# Patient Record
Sex: Female | Born: 1950 | Race: White | Hispanic: No | Marital: Married | Smoking: Never smoker
Health system: Southern US, Community
[De-identification: ages and names within clinical notes are randomized; demographics above are authoritative.]

## PROBLEM LIST (undated history)

## (undated) DIAGNOSIS — Z9884 Bariatric surgery status: Secondary | ICD-10-CM

## (undated) DIAGNOSIS — N189 Chronic kidney disease, unspecified: Secondary | ICD-10-CM

## (undated) DIAGNOSIS — F329 Major depressive disorder, single episode, unspecified: Secondary | ICD-10-CM

## (undated) DIAGNOSIS — F419 Anxiety disorder, unspecified: Secondary | ICD-10-CM

## (undated) DIAGNOSIS — G243 Spasmodic torticollis: Secondary | ICD-10-CM

## (undated) DIAGNOSIS — E785 Hyperlipidemia, unspecified: Secondary | ICD-10-CM

## (undated) DIAGNOSIS — I499 Cardiac arrhythmia, unspecified: Secondary | ICD-10-CM

## (undated) DIAGNOSIS — L4050 Arthropathic psoriasis, unspecified: Secondary | ICD-10-CM

## (undated) DIAGNOSIS — M797 Fibromyalgia: Secondary | ICD-10-CM

## (undated) DIAGNOSIS — I10 Essential (primary) hypertension: Secondary | ICD-10-CM

## (undated) DIAGNOSIS — I2510 Atherosclerotic heart disease of native coronary artery without angina pectoris: Secondary | ICD-10-CM

## (undated) DIAGNOSIS — E1140 Type 2 diabetes mellitus with diabetic neuropathy, unspecified: Secondary | ICD-10-CM

## (undated) DIAGNOSIS — F32A Depression, unspecified: Secondary | ICD-10-CM

## (undated) DIAGNOSIS — G4733 Obstructive sleep apnea (adult) (pediatric): Secondary | ICD-10-CM

## (undated) DIAGNOSIS — S46009A Unspecified injury of muscle(s) and tendon(s) of the rotator cuff of unspecified shoulder, initial encounter: Secondary | ICD-10-CM

## (undated) DIAGNOSIS — R600 Localized edema: Secondary | ICD-10-CM

## (undated) DIAGNOSIS — R002 Palpitations: Secondary | ICD-10-CM

## (undated) DIAGNOSIS — K909 Intestinal malabsorption, unspecified: Secondary | ICD-10-CM

## (undated) DIAGNOSIS — M1990 Unspecified osteoarthritis, unspecified site: Secondary | ICD-10-CM

## (undated) DIAGNOSIS — J45909 Unspecified asthma, uncomplicated: Secondary | ICD-10-CM

## (undated) DIAGNOSIS — H269 Unspecified cataract: Secondary | ICD-10-CM

## (undated) DIAGNOSIS — D649 Anemia, unspecified: Secondary | ICD-10-CM

## (undated) HISTORY — DX: Spasmodic torticollis: G24.3

## (undated) HISTORY — DX: Hyperlipidemia, unspecified: E78.5

## (undated) HISTORY — DX: Bariatric surgery status: Z98.84

## (undated) HISTORY — PX: PTCA: SHX146

## (undated) HISTORY — DX: Unspecified asthma, uncomplicated: J45.909

## (undated) HISTORY — DX: Depression, unspecified: F32.A

## (undated) HISTORY — DX: Unspecified cataract: H26.9

## (undated) HISTORY — DX: Atherosclerotic heart disease of native coronary artery without angina pectoris: I25.10

## (undated) HISTORY — DX: Major depressive disorder, single episode, unspecified: F32.9

## (undated) HISTORY — PX: WRIST SURGERY: SHX841

## (undated) HISTORY — PX: LAPAROSCOPIC GASTRIC BANDING: SHX1100

## (undated) HISTORY — DX: Palpitations: R00.2

## (undated) HISTORY — DX: Intestinal malabsorption, unspecified: K90.9

## (undated) HISTORY — DX: Unspecified osteoarthritis, unspecified site: M19.90

## (undated) HISTORY — DX: Type 2 diabetes mellitus with diabetic neuropathy, unspecified: E11.40

## (undated) HISTORY — DX: Anxiety disorder, unspecified: F41.9

## (undated) HISTORY — DX: Unspecified injury of muscle(s) and tendon(s) of the rotator cuff of unspecified shoulder, initial encounter: S46.009A

## (undated) HISTORY — PX: ABDOMINAL HYSTERECTOMY: SHX81

## (undated) HISTORY — PX: CHOLECYSTECTOMY: SHX55

## (undated) HISTORY — DX: Arthropathic psoriasis, unspecified: L40.50

## (undated) HISTORY — DX: Fibromyalgia: M79.7

## (undated) HISTORY — DX: Localized edema: R60.0

## (undated) HISTORY — DX: Obstructive sleep apnea (adult) (pediatric): G47.33

## (undated) HISTORY — DX: Essential (primary) hypertension: I10

## (undated) HISTORY — DX: Anemia, unspecified: D64.9

---

## 1999-12-15 ENCOUNTER — Encounter (INDEPENDENT_AMBULATORY_CARE_PROVIDER_SITE_OTHER): Payer: Self-pay

## 1999-12-15 ENCOUNTER — Ambulatory Visit (HOSPITAL_COMMUNITY): Admission: RE | Admit: 1999-12-15 | Discharge: 1999-12-15 | Payer: Self-pay | Admitting: Obstetrics and Gynecology

## 2000-08-30 ENCOUNTER — Inpatient Hospital Stay (HOSPITAL_COMMUNITY): Admission: RE | Admit: 2000-08-30 | Discharge: 2000-09-01 | Payer: Self-pay | Admitting: Obstetrics and Gynecology

## 2000-08-30 ENCOUNTER — Encounter (INDEPENDENT_AMBULATORY_CARE_PROVIDER_SITE_OTHER): Payer: Self-pay | Admitting: Specialist

## 2002-02-25 ENCOUNTER — Encounter: Admission: RE | Admit: 2002-02-25 | Discharge: 2002-02-25 | Payer: Self-pay

## 2002-06-02 ENCOUNTER — Encounter: Admission: RE | Admit: 2002-06-02 | Discharge: 2002-06-02 | Payer: Self-pay

## 2002-07-21 ENCOUNTER — Encounter: Payer: Self-pay | Admitting: Rheumatology

## 2002-07-21 ENCOUNTER — Encounter: Admission: RE | Admit: 2002-07-21 | Discharge: 2002-07-21 | Payer: Self-pay | Admitting: Rheumatology

## 2002-10-02 ENCOUNTER — Ambulatory Visit (HOSPITAL_COMMUNITY): Admission: RE | Admit: 2002-10-02 | Discharge: 2002-10-02 | Payer: Self-pay | Admitting: Cardiology

## 2002-10-02 ENCOUNTER — Encounter: Payer: Self-pay | Admitting: Cardiology

## 2002-10-02 HISTORY — PX: CARDIAC CATHETERIZATION: SHX172

## 2003-05-25 ENCOUNTER — Encounter: Admission: RE | Admit: 2003-05-25 | Discharge: 2003-05-25 | Payer: Self-pay | Admitting: Family Medicine

## 2005-08-03 ENCOUNTER — Encounter: Admission: RE | Admit: 2005-08-03 | Discharge: 2005-08-03 | Payer: Self-pay | Admitting: Family Medicine

## 2005-10-02 ENCOUNTER — Ambulatory Visit: Payer: Self-pay | Admitting: Cardiology

## 2005-10-04 ENCOUNTER — Ambulatory Visit: Payer: Self-pay | Admitting: Internal Medicine

## 2005-10-04 ENCOUNTER — Inpatient Hospital Stay (HOSPITAL_COMMUNITY): Admission: AD | Admit: 2005-10-04 | Discharge: 2005-10-09 | Payer: Self-pay | Admitting: Internal Medicine

## 2005-10-05 ENCOUNTER — Ambulatory Visit: Payer: Self-pay | Admitting: Infectious Diseases

## 2005-10-05 ENCOUNTER — Encounter: Payer: Self-pay | Admitting: Vascular Surgery

## 2005-10-06 ENCOUNTER — Encounter (INDEPENDENT_AMBULATORY_CARE_PROVIDER_SITE_OTHER): Payer: Self-pay | Admitting: *Deleted

## 2005-10-25 ENCOUNTER — Ambulatory Visit: Payer: Self-pay | Admitting: Internal Medicine

## 2005-10-25 ENCOUNTER — Ambulatory Visit (HOSPITAL_COMMUNITY): Admission: RE | Admit: 2005-10-25 | Discharge: 2005-10-25 | Payer: Self-pay | Admitting: Internal Medicine

## 2005-11-06 ENCOUNTER — Ambulatory Visit: Payer: Self-pay | Admitting: Infectious Diseases

## 2005-11-10 ENCOUNTER — Ambulatory Visit: Payer: Self-pay | Admitting: Internal Medicine

## 2005-11-15 ENCOUNTER — Ambulatory Visit: Payer: Self-pay | Admitting: Internal Medicine

## 2005-11-16 ENCOUNTER — Ambulatory Visit (HOSPITAL_COMMUNITY): Admission: RE | Admit: 2005-11-16 | Discharge: 2005-11-16 | Payer: Self-pay | Admitting: Internal Medicine

## 2005-11-23 ENCOUNTER — Ambulatory Visit: Payer: Self-pay | Admitting: Internal Medicine

## 2005-12-19 ENCOUNTER — Ambulatory Visit: Payer: Self-pay | Admitting: Internal Medicine

## 2006-05-18 ENCOUNTER — Telehealth: Payer: Self-pay | Admitting: *Deleted

## 2006-06-19 ENCOUNTER — Ambulatory Visit: Payer: Self-pay | Admitting: Internal Medicine

## 2006-11-12 DIAGNOSIS — R002 Palpitations: Secondary | ICD-10-CM

## 2006-11-12 DIAGNOSIS — E785 Hyperlipidemia, unspecified: Secondary | ICD-10-CM | POA: Insufficient documentation

## 2006-11-12 DIAGNOSIS — J45909 Unspecified asthma, uncomplicated: Secondary | ICD-10-CM

## 2006-11-12 DIAGNOSIS — I10 Essential (primary) hypertension: Secondary | ICD-10-CM | POA: Insufficient documentation

## 2006-11-12 HISTORY — DX: Unspecified asthma, uncomplicated: J45.909

## 2006-11-12 HISTORY — DX: Palpitations: R00.2

## 2006-12-05 DIAGNOSIS — M1990 Unspecified osteoarthritis, unspecified site: Secondary | ICD-10-CM | POA: Insufficient documentation

## 2006-12-25 ENCOUNTER — Encounter: Payer: Self-pay | Admitting: Internal Medicine

## 2007-01-04 ENCOUNTER — Ambulatory Visit: Payer: Self-pay | Admitting: Internal Medicine

## 2007-01-04 DIAGNOSIS — M797 Fibromyalgia: Secondary | ICD-10-CM

## 2007-01-04 HISTORY — DX: Fibromyalgia: M79.7

## 2007-01-11 ENCOUNTER — Ambulatory Visit: Payer: Self-pay | Admitting: Internal Medicine

## 2007-01-31 ENCOUNTER — Ambulatory Visit (HOSPITAL_COMMUNITY): Admission: RE | Admit: 2007-01-31 | Discharge: 2007-01-31 | Payer: Self-pay | Admitting: Internal Medicine

## 2007-01-31 ENCOUNTER — Ambulatory Visit: Payer: Self-pay | Admitting: Internal Medicine

## 2007-02-01 ENCOUNTER — Ambulatory Visit: Payer: Self-pay

## 2007-02-01 ENCOUNTER — Encounter: Payer: Self-pay | Admitting: Internal Medicine

## 2007-03-17 ENCOUNTER — Emergency Department (HOSPITAL_COMMUNITY): Admission: EM | Admit: 2007-03-17 | Discharge: 2007-03-17 | Payer: Self-pay | Admitting: Family Medicine

## 2007-04-14 ENCOUNTER — Emergency Department (HOSPITAL_COMMUNITY): Admission: EM | Admit: 2007-04-14 | Discharge: 2007-04-14 | Payer: Self-pay | Admitting: Emergency Medicine

## 2007-05-23 ENCOUNTER — Encounter: Payer: Self-pay | Admitting: Internal Medicine

## 2007-06-20 ENCOUNTER — Ambulatory Visit: Payer: Self-pay | Admitting: Internal Medicine

## 2007-08-02 ENCOUNTER — Ambulatory Visit: Admission: RE | Admit: 2007-08-02 | Discharge: 2007-08-02 | Payer: Self-pay | Admitting: Internal Medicine

## 2007-08-02 ENCOUNTER — Encounter: Payer: Self-pay | Admitting: Internal Medicine

## 2007-09-25 ENCOUNTER — Telehealth: Payer: Self-pay | Admitting: Internal Medicine

## 2007-10-25 ENCOUNTER — Encounter: Admission: RE | Admit: 2007-10-25 | Discharge: 2007-10-25 | Payer: Self-pay | Admitting: Internal Medicine

## 2007-11-29 ENCOUNTER — Ambulatory Visit: Admission: RE | Admit: 2007-11-29 | Discharge: 2007-11-29 | Payer: Self-pay | Admitting: Internal Medicine

## 2007-12-09 ENCOUNTER — Telehealth: Payer: Self-pay | Admitting: Internal Medicine

## 2007-12-10 ENCOUNTER — Telehealth: Payer: Self-pay | Admitting: Internal Medicine

## 2007-12-13 ENCOUNTER — Encounter: Payer: Self-pay | Admitting: Internal Medicine

## 2008-02-13 ENCOUNTER — Ambulatory Visit (HOSPITAL_BASED_OUTPATIENT_CLINIC_OR_DEPARTMENT_OTHER): Admission: RE | Admit: 2008-02-13 | Discharge: 2008-02-14 | Payer: Self-pay | Admitting: Orthopedic Surgery

## 2008-03-09 ENCOUNTER — Telehealth: Payer: Self-pay | Admitting: Internal Medicine

## 2008-03-09 ENCOUNTER — Ambulatory Visit: Payer: Self-pay | Admitting: Internal Medicine

## 2008-03-09 LAB — CONVERTED CEMR LAB
ALT: 18 units/L (ref 0–35)
Albumin: 3.5 g/dL (ref 3.5–5.2)
Total Protein: 6.7 g/dL (ref 6.0–8.3)

## 2008-03-25 ENCOUNTER — Encounter: Payer: Self-pay | Admitting: Internal Medicine

## 2008-08-06 DIAGNOSIS — G4733 Obstructive sleep apnea (adult) (pediatric): Secondary | ICD-10-CM

## 2008-08-06 DIAGNOSIS — Z8619 Personal history of other infectious and parasitic diseases: Secondary | ICD-10-CM

## 2008-08-06 DIAGNOSIS — E1142 Type 2 diabetes mellitus with diabetic polyneuropathy: Secondary | ICD-10-CM | POA: Insufficient documentation

## 2008-08-06 DIAGNOSIS — M545 Low back pain: Secondary | ICD-10-CM | POA: Insufficient documentation

## 2008-08-06 HISTORY — DX: Obstructive sleep apnea (adult) (pediatric): G47.33

## 2008-08-12 ENCOUNTER — Ambulatory Visit: Payer: Self-pay | Admitting: Internal Medicine

## 2008-09-08 ENCOUNTER — Encounter: Payer: Self-pay | Admitting: Internal Medicine

## 2008-09-08 ENCOUNTER — Ambulatory Visit (HOSPITAL_COMMUNITY): Admission: RE | Admit: 2008-09-08 | Discharge: 2008-09-08 | Payer: Self-pay | Admitting: General Surgery

## 2008-09-15 ENCOUNTER — Encounter: Admission: RE | Admit: 2008-09-15 | Discharge: 2008-09-15 | Payer: Self-pay | Admitting: General Surgery

## 2008-09-22 ENCOUNTER — Encounter: Payer: Self-pay | Admitting: Internal Medicine

## 2008-10-26 ENCOUNTER — Ambulatory Visit: Payer: Self-pay | Admitting: Internal Medicine

## 2008-12-14 DIAGNOSIS — R600 Localized edema: Secondary | ICD-10-CM

## 2008-12-14 DIAGNOSIS — F419 Anxiety disorder, unspecified: Secondary | ICD-10-CM

## 2008-12-14 DIAGNOSIS — F32A Depression, unspecified: Secondary | ICD-10-CM

## 2008-12-14 DIAGNOSIS — D649 Anemia, unspecified: Secondary | ICD-10-CM

## 2008-12-14 DIAGNOSIS — R609 Edema, unspecified: Secondary | ICD-10-CM

## 2008-12-14 DIAGNOSIS — I10 Essential (primary) hypertension: Secondary | ICD-10-CM

## 2008-12-14 DIAGNOSIS — M1990 Unspecified osteoarthritis, unspecified site: Secondary | ICD-10-CM

## 2008-12-14 DIAGNOSIS — B2790 Infectious mononucleosis, unspecified without complication: Secondary | ICD-10-CM

## 2008-12-14 DIAGNOSIS — E1140 Type 2 diabetes mellitus with diabetic neuropathy, unspecified: Secondary | ICD-10-CM

## 2008-12-14 DIAGNOSIS — I2510 Atherosclerotic heart disease of native coronary artery without angina pectoris: Secondary | ICD-10-CM

## 2008-12-14 DIAGNOSIS — J45909 Unspecified asthma, uncomplicated: Secondary | ICD-10-CM

## 2008-12-14 HISTORY — DX: Atherosclerotic heart disease of native coronary artery without angina pectoris: I25.10

## 2008-12-14 HISTORY — DX: Type 2 diabetes mellitus with diabetic neuropathy, unspecified: E11.40

## 2008-12-14 HISTORY — DX: Localized edema: R60.0

## 2008-12-14 HISTORY — DX: Depression, unspecified: F32.A

## 2008-12-14 HISTORY — DX: Essential (primary) hypertension: I10

## 2008-12-14 HISTORY — DX: Anxiety disorder, unspecified: F41.9

## 2008-12-14 HISTORY — DX: Unspecified osteoarthritis, unspecified site: M19.90

## 2008-12-14 HISTORY — DX: Anemia, unspecified: D64.9

## 2009-03-11 ENCOUNTER — Encounter: Admission: RE | Admit: 2009-03-11 | Discharge: 2009-04-07 | Payer: Self-pay | Admitting: General Surgery

## 2009-03-23 ENCOUNTER — Ambulatory Visit (HOSPITAL_COMMUNITY): Admission: RE | Admit: 2009-03-23 | Discharge: 2009-03-24 | Payer: Self-pay | Admitting: General Surgery

## 2009-06-29 ENCOUNTER — Encounter: Admission: RE | Admit: 2009-06-29 | Discharge: 2009-09-27 | Payer: Self-pay | Admitting: General Surgery

## 2009-07-29 ENCOUNTER — Ambulatory Visit (HOSPITAL_COMMUNITY): Admission: RE | Admit: 2009-07-29 | Discharge: 2009-07-29 | Payer: Self-pay | Admitting: Family Medicine

## 2009-10-05 ENCOUNTER — Encounter: Payer: Self-pay | Admitting: Internal Medicine

## 2009-10-05 ENCOUNTER — Encounter: Admission: RE | Admit: 2009-10-05 | Discharge: 2009-10-05 | Payer: Self-pay | Admitting: General Surgery

## 2010-01-21 ENCOUNTER — Ambulatory Visit: Payer: Self-pay | Admitting: Internal Medicine

## 2010-01-21 DIAGNOSIS — E785 Hyperlipidemia, unspecified: Secondary | ICD-10-CM

## 2010-01-21 HISTORY — DX: Hyperlipidemia, unspecified: E78.5

## 2010-01-21 LAB — CONVERTED CEMR LAB: Hgb A1c MFr Bld: 7.5 % — ABNORMAL HIGH (ref 4.6–6.5)

## 2010-04-21 ENCOUNTER — Telehealth: Payer: Self-pay | Admitting: Internal Medicine

## 2010-04-26 ENCOUNTER — Encounter: Payer: Self-pay | Admitting: Internal Medicine

## 2010-04-29 ENCOUNTER — Ambulatory Visit: Admit: 2010-04-29 | Payer: Self-pay | Admitting: Internal Medicine

## 2010-05-10 NOTE — Assessment & Plan Note (Signed)
Summary: pt to re-est/pt moved back/cjr   Vital Signs:  Patient profile:   60 year old female Height:      65 inches Weight:      191 pounds BMI:     31.90 Temp:     98.2 degrees F oral BP sitting:   108 / 90  (right arm) Cuff size:   regular  Vitals Entered By: Duard Brady LPN (January 21, 2010 1:56 PM) CC: re-establish - doing well   lap band surgery in dec 2010     fbs 90 Is Patient Diabetic? Yes Did you bring your meter with you today? No   CC:  re-establish - doing well   lap band surgery in dec 2010     fbs 90.  History of Present Illness: 60 year old patient who is seen today to reestablish with our practice.  She has a living in [place] and has relocated back to [place] recently.  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  She is approximately 10 months status post lap band surgery and has lost 54 pounds in weight.  She has type 2 diabetes.  She has a history of peripheral neuropathy and early retinopathy.  She has dyslipidemia, but self discontinued Lipitor.  Some time ago.  She has treated hypertension.  She has a history of asthma, which has been stable  Preventive Screening-Counseling & Management  Caffeine-Diet-Exercise     Does Patient Exercise: yes  Allergies (verified): No Known Drug Allergies  Past History:  Past Medical History: Obesity 2007 history of CMV hepatitis INFECTIOUS MONONUCLEOSIS, HX OF (ICD-V12.09) FATIGUE (ICD-780.79) SINUS TACHYCARDIA,HX OF (ICD-427.89) ASTHMA (ICD-493.90) DIZZINESS,HX OF (ICD-780.4) EDEMA, ANKLES (ICD-782.3) HX OF GALLSTONE (ICD-V12.79) ANEMIA, HX OF (ICD-V12.3) UTI'S, HX OF (ICD-V13.00) PALPITATIONS, HX OF (ICD-V12.50) DYSPNEA (ICD-786.05) ANXIETY DISORDER, HX OF (ICD-V11.2) SPECIAL SCREENING FOR MALIGNANT NEOPLASMS COLON (ICD-V76.51) PERIPHERAL NEUROPATHY (ICD-356.9) SLEEP APNEA, OBSTRUCTIVE, MODERATE (ICD-327.23) FIBROMYALGIA (ICD-729.1) EXAMINATION, ROUTINE MEDICAL  (ICD-V70.0) OSTEOARTHRITIS (ICD-715.90) HYPERTENSION (ICD-401.9) HYPERLIPIDEMIA (ICD-272.4) DIABETES MELLITUS, TYPE II (ICD-250.00)   Depression  Past Surgical History: Cholecystectomy 2004 Hysterectomy  2000 left wrist surgery, November 2009 PERCUTANEOUS TRANSLUMINAL CORONARY ANGIOPLASTY, HX OF (ICD-V45.82) 10-02-2002 Lap Band surgery 03-2009 ([facility]) colonoscopy 2010  Family History: Reviewed history from 08/12/2008 and no changes required. father: died at 36, history of diabetes, hypertension 3 row vascular disease mother: died age 61, senile dementia one sister: history of measles encephalitis, with residual impaired cognitive function  Social History: Reviewed history from 08/06/2008 and no changes required. Married Patient has never smoked.  Alcohol Use - yes-occasional wine Daily Caffeine Use- 1 cup daily Illicit Drug Use - no Regular exercise-yes [place]- PT clinicDoes Patient Exercise:  yes  Review of Systems  The patient denies anorexia, fever, weight loss, weight gain, vision loss, decreased hearing, hoarseness, chest pain, syncope, dyspnea on exertion, peripheral edema, prolonged cough, headaches, hemoptysis, abdominal pain, melena, hematochezia, severe indigestion/heartburn, hematuria, incontinence, genital sores, muscle weakness, suspicious skin lesions, transient blindness, difficulty walking, depression, unusual weight change, abnormal bleeding, enlarged lymph nodes, angioedema, and breast masses.    Physical Exam  General:  overweight-appearing.  110/70overweight-appearing.   Head:  Normocephalic and atraumatic without obvious abnormalities. No apparent alopecia or balding. Eyes:  No corneal or conjunctival inflammation noted. EOMI. Perrla. Funduscopic exam benign, without hemorrhages, exudates or papilledema. Vision grossly normal. Mouth:  Oral mucosa and oropharynx without lesions or exudates.  Teeth in good repair. Neck:  No deformities, masses,  or tenderness noted. Lungs:  Normal respiratory effort, chest  expands symmetrically. Lungs are clear to auscultation, no crackles or wheezes. Heart:  Normal rate and regular rhythm. S1 and S2 normal without gallop, murmur, click, rub or other extra sounds. Abdomen:  Bowel sounds positive,abdomen soft and non-tender without masses, organomegaly or hernias noted. Msk:  No deformity or scoliosis noted of thoracic or lumbar spine.   Pulses:  R and L carotid,radial,femoral,dorsalis pedis and posterior tibial pulses are full and equal bilaterally Extremities:  No clubbing, cyanosis, edema, or deformity noted with normal full range of motion of all joints.   Skin:  Intact without suspicious lesions or rashes Cervical Nodes:  No lymphadenopathy noted Axillary Nodes:  No palpable lymphadenopathy Psych:  Cognition and judgment appear intact. Alert and cooperative with normal attention span and concentration. No apparent delusions, illusions, hallucinations  Diabetes Management Exam:    Eye Exam:       Eye Exam done here today          Results: normal   Impression & Recommendations:  Problem # 1:  HYPERTENSION (ICD-401.9)  Her updated medication list for this problem includes:    Lisinopril-hydrochlorothiazide 20-12.5 Mg Tabs (Lisinopril-hydrochlorothiazide) .Marland Kitchen... Take 1 tablet by mouth once a day    Diltiazem Hcl Cr 180 Mg Cp24 (Diltiazem hcl) .Marland Kitchen... Take 1 tablet by mouth once a day  Her updated medication list for this problem includes:    Lisinopril-hydrochlorothiazide 20-12.5 Mg Tabs (Lisinopril-hydrochlorothiazide) .Marland Kitchen... Take 1 tablet by mouth once a day    Diltiazem Hcl Cr 180 Mg Cp24 (Diltiazem hcl) .Marland Kitchen... Take 1 tablet by mouth once a day  Problem # 2:  DIABETES MELLITUS, TYPE II (ICD-250.00)  The following medications were removed from the medication list:    Actoplus Met 15-850 Mg Tabs (Pioglitazone hcl-metformin hcl) .Marland Kitchen... Take 1 tablet by mouth two times a day Her updated medication  list for this problem includes:    Lisinopril-hydrochlorothiazide 20-12.5 Mg Tabs (Lisinopril-hydrochlorothiazide) .Marland Kitchen... Take 1 tablet by mouth once a day    Levemir 100 Unit/ml Soln (Insulin detemir) .Marland KitchenMarland KitchenMarland KitchenMarland Kitchen 80 units subq at bedtime  The following medications were removed from the medication list:    Actoplus Met 15-850 Mg Tabs (Pioglitazone hcl-metformin hcl) .Marland Kitchen... Take 1 tablet by mouth two times a day Her updated medication list for this problem includes:    Lisinopril-hydrochlorothiazide 20-12.5 Mg Tabs (Lisinopril-hydrochlorothiazide) .Marland Kitchen... Take 1 tablet by mouth once a day    Levemir 100 Unit/ml Soln (Insulin detemir) .Marland KitchenMarland KitchenMarland KitchenMarland Kitchen 80 units subq at bedtime  Problem # 3:  HYPERLIPIDEMIA (ICD-272.4)  The following medications were removed from the medication list:    Lipitor 10 Mg Tabs (Atorvastatin calcium) .Marland Kitchen... 1 once daily will check a fasting lipid panel next visit  The following medications were removed from the medication list:    Lipitor 10 Mg Tabs (Atorvastatin calcium) .Marland Kitchen... 1 once daily  Complete Medication List: 1)  Alprazolam 0.25 Mg Tabs (Alprazolam) .... Take 1 tablet by mouth at bedtime 2)  Lisinopril-hydrochlorothiazide 20-12.5 Mg Tabs (Lisinopril-hydrochlorothiazide) .... Take 1 tablet by mouth once a day 3)  Levemir 100 Unit/ml Soln (Insulin detemir) .... 80 units subq at bedtime 4)  Cymbalta 60 Mg Cpep (Duloxetine hcl) .... Take 1 tablet by mouth once a day 5)  Diltiazem Hcl Cr 180 Mg Cp24 (Diltiazem hcl) .... Take 1 tablet by mouth once a day 6)  Multivitamins Caps (Multiple vitamin) .... Once daily 7)  Calcium Carbonate 600 Mg Tabs (Calcium carbonate) .... Once daily 8)  Freestyle Lite Strp (Glucose blood) .... Use  two times a day 9)  Bd Insulin Syringe Microfine 27g X 5/8" 1 Ml Misc (Insulin syringe-needle u-100) .... Use daily 10)  Estrace 1 Mg Tabs (Estradiol) .Marland Kitchen.. 1 once daily 11)  Cyclobenzaprine Hcl 10 Mg Tabs (Cyclobenzaprine hcl) .... One tablet by mouth at  bedtime 12)  Lancets Misc (Lancets) .... Uad 13)  Proair Hfa 108 (90 Base) Mcg/act Aers (Albuterol sulfate) .... 2 puffs q 6hrs prn  Other Orders: Venipuncture (62376) TLB-A1C / Hgb A1C (Glycohemoglobin) (83036-A1C) Specimen Handling (28315)  Patient Instructions: 1)  Please schedule a follow-up appointment in 3 months. 2)  Advised not to eat any food or drink any liquids after 10 PM the night before your procedure. 3)  Limit your Sodium (Salt). 4)  It is important that you exercise regularly at least 20 minutes 5 times a week. If you develop chest pain, have severe difficulty breathing, or feel very tired , stop exercising immediately and seek medical attention. 5)  You need to lose weight. Consider a lower calorie diet and regular exercise.  6)  Check your blood sugars regularly. If your readings are usually above : or below 70 you should contact our office. 7)  It is important that your Diabetic A1c level is checked every 3 months. 8)  See your eye doctor yearly to check for diabetic eye damage. Prescriptions: PROAIR HFA 108 (90 BASE) MCG/ACT AERS (ALBUTEROL SULFATE) 2 puffs q 6hrs prn  #3 x 6   Entered and Authorized by:   Gordy Savers  MD   Signed by:   Gordy Savers  MD on 01/21/2010   Method used:   Print then Give to Patient   RxID:   [id number] LANCETS  MISC (LANCETS) UAD  #200 x 8   Entered and Authorized by:   Gordy Savers  MD   Signed by:   Gordy Savers  MD on 01/21/2010   Method used:   Print then Give to Patient   RxID:   [id number] CYCLOBENZAPRINE HCL 10 MG TABS (CYCLOBENZAPRINE HCL) one tablet by mouth at bedtime  #90 x 5   Entered and Authorized by:   Gordy Savers  MD   Signed by:   Gordy Savers  MD on 01/21/2010   Method used:   Print then Give to Patient   RxID:   [id number] BD INSULIN SYRINGE MICROFINE 27G X 5/8" 1 ML  MISC (INSULIN SYRINGE-NEEDLE U-100) use daily  #30 Each x 0   Entered and Authorized  by:   Gordy Savers  MD   Signed by:   Gordy Savers  MD on 01/21/2010   Method used:   Print then Give to Patient   RxID:   [id number] FREESTYLE LITE   STRP (GLUCOSE BLOOD) use two times a day  #100 Each x 5   Entered and Authorized by:   Gordy Savers  MD   Signed by:   Gordy Savers  MD on 01/21/2010   Method used:   Print then Give to Patient   RxID:   [id number] DILTIAZEM HCL CR 180 MG  CP24 (DILTIAZEM HCL) Take 1 tablet by mouth once a day  #90 x 4   Entered and Authorized by:   Gordy Savers  MD   Signed by:   Gordy Savers  MD on 01/21/2010   Method used:   Print then Give to Patient   RxID:   [id number] LEVEMIR 100 UNIT/ML  SOLN (INSULIN  DETEMIR) 80 units subq at bedtime  #3 pens x 6   Entered and Authorized by:   Gordy Savers  MD   Signed by:   Gordy Savers  MD on 01/21/2010   Method used:   Print then Give to Patient   RxID:   [id number] CYMBALTA 60 MG  CPEP (DULOXETINE HCL) Take 1 tablet by mouth once a day  #90 x 5   Entered and Authorized by:   Gordy Savers  MD   Signed by:   Gordy Savers  MD on 01/21/2010   Method used:   Print then Give to Patient   RxID:   [id number] LISINOPRIL-HYDROCHLOROTHIAZIDE 20-12.5 MG TABS (LISINOPRIL-HYDROCHLOROTHIAZIDE) Take 1 tablet by mouth once a day  #90 Tablet x 3   Entered and Authorized by:   Gordy Savers  MD   Signed by:   Gordy Savers  MD on 01/21/2010   Method used:   Print then Give to Patient   RxID:   [id number] ALPRAZOLAM 0.25 MG TABS (ALPRAZOLAM) Take 1 tablet by mouth at bedtime  #60 x 4   Entered and Authorized by:   Gordy Savers  MD   Signed by:   Gordy Savers  MD on 01/21/2010   Method used:   Print then Give to Patient   RxID:   [id number]

## 2010-05-10 NOTE — Medication Information (Signed)
Summary: Levemir Denied  Levemir Denied   Imported By: Maryln Gottron 10/07/2009 12:34:08  _____________________________________________________________________  External Attachment:    Type:   Image     Comment:   External Document

## 2010-05-12 NOTE — Progress Notes (Signed)
Summary: Pt req to get cbc added to a1c labs  Phone Note Call from Patient Call back at [phone] cell   Caller: Patient Summary of Call: Pt sch to have a1c lvl check and is req to have cbc lvl added to lab, because pts health screen showed abnormal cbc reading. Pls advise.  Initial call taken by: Lucy Antigua,  April 21, 2010 11:52 AM  Follow-up for Phone Call        ok Follow-up by: Gordy Savers  MD,  April 21, 2010 5:02 PM    Additional Follow-up for Phone Call Additional follow up Details #2::    added to labs   kik Follow-up by: Duard Brady LPN,  April 21, 2010 5:09 PM

## 2010-05-18 NOTE — Letter (Signed)
Summary: [facility]  [facility]   Imported By: Maryln Gottron 05/13/2010 12:49:03  _____________________________________________________________________  External Attachment:    Type:   Image     Comment:   External Document

## 2010-05-20 ENCOUNTER — Other Ambulatory Visit (INDEPENDENT_AMBULATORY_CARE_PROVIDER_SITE_OTHER): Payer: 59 | Admitting: Internal Medicine

## 2010-05-20 DIAGNOSIS — E119 Type 2 diabetes mellitus without complications: Secondary | ICD-10-CM

## 2010-05-20 LAB — CBC WITH DIFFERENTIAL/PLATELET
Eosinophils Relative: 2.4 % (ref 0.0–5.0)
HCT: 38.6 % (ref 36.0–46.0)
Hemoglobin: 13.2 g/dL (ref 12.0–15.0)
Lymphs Abs: 1.7 10*3/uL (ref 0.7–4.0)
MCHC: 34.1 g/dL (ref 30.0–36.0)
MCV: 81.8 fl (ref 78.0–100.0)
Monocytes Absolute: 0.5 10*3/uL (ref 0.1–1.0)
Neutro Abs: 3.7 10*3/uL (ref 1.4–7.7)
RBC: 4.73 Mil/uL (ref 3.87–5.11)
WBC: 6.1 10*3/uL (ref 4.5–10.5)

## 2010-05-20 LAB — HEMOGLOBIN A1C: Hgb A1c MFr Bld: 7.5 % — ABNORMAL HIGH (ref 4.6–6.5)

## 2010-05-23 NOTE — Progress Notes (Signed)
Quick Note:  Attempt to call - pt on cell # - [id number] if questions - keep appt 05/27/10 to discuss labs per Dr. Amador Cunas.KIK ______

## 2010-05-23 NOTE — Progress Notes (Signed)
Quick Note:  Schedule rov ______

## 2010-05-25 ENCOUNTER — Encounter: Payer: Self-pay | Admitting: Internal Medicine

## 2010-05-27 ENCOUNTER — Encounter: Payer: Self-pay | Admitting: Internal Medicine

## 2010-05-27 ENCOUNTER — Ambulatory Visit (INDEPENDENT_AMBULATORY_CARE_PROVIDER_SITE_OTHER): Payer: 59 | Admitting: Internal Medicine

## 2010-05-27 DIAGNOSIS — I10 Essential (primary) hypertension: Secondary | ICD-10-CM

## 2010-05-27 DIAGNOSIS — E119 Type 2 diabetes mellitus without complications: Secondary | ICD-10-CM

## 2010-05-27 MED ORDER — INSULIN ASPART 100 UNIT/ML ~~LOC~~ SOLN: SUBCUTANEOUS | Status: DC

## 2010-05-27 MED ORDER — "INSULIN SYRINGE-NEEDLE U-100 27G X 5/8"" 1 ML MISC": Status: DC

## 2010-05-27 MED ORDER — INSULIN DETEMIR 100 UNIT/ML ~~LOC~~ SOLN: 30.0000 [IU] | Freq: Every day | SUBCUTANEOUS | Status: DC

## 2010-05-27 MED ORDER — METFORMIN HCL 500 MG PO TABS: 500.0000 mg | ORAL_TABLET | Freq: Two times a day (BID) | ORAL | Status: DC

## 2010-05-27 NOTE — Progress Notes (Signed)
  Subjective:    Patient ID: Priscilla [name], female    DOB: January 26, 1951, 60 y.o.   MRN: [id number]  HPI   60 year old patient who is seen today for follow-up of her hypertension.  This has been Stable on her present regimen.  Two status post lap band surgery in November of 2010 and has had a nice steady weight loss and is down now approximately 60 pounds.  Her blood pressure has been well controlled. She has type 2 diabetes and has down titrated her Levemir from 150 to 70 units.  She often is unable to tolerate her evening meal, with some, vomiting, and quite variable blood sugars.  She has been on metformin in the past, but not recently.  There apparently have been no tolerability issues  Review of Systems  Constitutional: Negative.   HENT: Negative for hearing loss, congestion, sore throat, rhinorrhea, dental problem, sinus pressure and tinnitus.   Eyes: Negative for pain, discharge and visual disturbance.  Respiratory: Negative for cough and shortness of breath.   Cardiovascular: Negative for chest pain, palpitations and leg swelling.  Gastrointestinal: Negative for nausea, vomiting, abdominal pain, diarrhea, constipation, blood in stool and abdominal distention.  Genitourinary: Negative for dysuria, urgency, frequency, hematuria, flank pain, vaginal bleeding, vaginal discharge, difficulty urinating, vaginal pain and pelvic pain.  Musculoskeletal: Negative for joint swelling, arthralgias and gait problem.  Skin: Negative for rash.  Neurological: Negative for dizziness, syncope, speech difficulty, weakness, numbness and headaches.  Hematological: Negative for adenopathy.  Psychiatric/Behavioral: Negative for behavioral problems, dysphoric mood and agitation. The patient is not nervous/anxious.        Objective:   Physical Exam  Constitutional: She appears well-developed and well-nourished. No distress.       overweight Blood pressure 112/70          Assessment & Plan:    Hypertension stable Diabetes mellitus, type II.  Will down titrate the left compared to 30 units and give her ongoing weight loss and bearable.  Eating habits and fasting blood sugars will start Meal  time insulin and resume Metformin.  Her  Hemoglobin A1c is elevated at 7.5

## 2010-05-27 NOTE — Patient Instructions (Signed)
Limit your sodium (Salt) intake   Please check your hemoglobin A1c every 3 months    It is important that you exercise regularly, at least 20 minutes 3 to 4 times per week.  If you develop chest pain or shortness of breath seek  medical attention.  Return in 3 months for follow-up  

## 2010-07-12 LAB — COMPREHENSIVE METABOLIC PANEL
ALT: 35 U/L (ref 0–35)
AST: 18 U/L (ref 0–37)
Alkaline Phosphatase: 104 U/L (ref 39–117)
CO2: 28 mEq/L (ref 19–32)
Chloride: 107 mEq/L (ref 96–112)
Creatinine, Ser: 0.72 mg/dL (ref 0.4–1.2)
Potassium: 4.8 mEq/L (ref 3.5–5.1)
Sodium: 141 mEq/L (ref 135–145)
Total Bilirubin: 0.3 mg/dL (ref 0.3–1.2)

## 2010-07-12 LAB — DIFFERENTIAL
Basophils Absolute: 0 10*3/uL (ref 0.0–0.1)
Basophils Relative: 0 % (ref 0–1)
Basophils Relative: 1 % (ref 0–1)
Eosinophils Absolute: 0 10*3/uL (ref 0.0–0.7)
Eosinophils Absolute: 0.2 10*3/uL (ref 0.0–0.7)
Eosinophils Relative: 2 % (ref 0–5)
Lymphs Abs: 2.1 10*3/uL (ref 0.7–4.0)
Monocytes Relative: 5 % (ref 3–12)
Monocytes Relative: 7 % (ref 3–12)
Neutrophils Relative %: 82 % — ABNORMAL HIGH (ref 43–77)

## 2010-07-12 LAB — CBC
HCT: 39.6 % (ref 36.0–46.0)
MCHC: 32.2 g/dL (ref 30.0–36.0)
MCHC: 32.6 g/dL (ref 30.0–36.0)
MCV: 83.4 fL (ref 78.0–100.0)
MCV: 83.5 fL (ref 78.0–100.0)
Platelets: 290 10*3/uL (ref 150–400)
RDW: 15.4 % (ref 11.5–15.5)
RDW: 15.6 % — ABNORMAL HIGH (ref 11.5–15.5)

## 2010-07-12 LAB — GLUCOSE, CAPILLARY
Glucose-Capillary: 135 mg/dL — ABNORMAL HIGH (ref 70–99)
Glucose-Capillary: 171 mg/dL — ABNORMAL HIGH (ref 70–99)
Glucose-Capillary: 186 mg/dL — ABNORMAL HIGH (ref 70–99)
Glucose-Capillary: 193 mg/dL — ABNORMAL HIGH (ref 70–99)

## 2010-07-19 ENCOUNTER — Other Ambulatory Visit: Payer: Self-pay | Admitting: Internal Medicine

## 2010-07-27 ENCOUNTER — Other Ambulatory Visit: Payer: Self-pay | Admitting: Internal Medicine

## 2010-08-04 ENCOUNTER — Ambulatory Visit (INDEPENDENT_AMBULATORY_CARE_PROVIDER_SITE_OTHER): Payer: 59 | Admitting: Internal Medicine

## 2010-08-04 ENCOUNTER — Encounter: Payer: Self-pay | Admitting: Internal Medicine

## 2010-08-04 VITALS — BP 100/62 | Temp 98.2°F | Wt 190.0 lb

## 2010-08-04 DIAGNOSIS — M79609 Pain in unspecified limb: Secondary | ICD-10-CM

## 2010-08-04 DIAGNOSIS — I2510 Atherosclerotic heart disease of native coronary artery without angina pectoris: Secondary | ICD-10-CM

## 2010-08-04 DIAGNOSIS — M79603 Pain in arm, unspecified: Secondary | ICD-10-CM

## 2010-08-04 MED ORDER — NITROGLYCERIN 0.4 MG SL SUBL: 0.4000 mg | SUBLINGUAL_TABLET | SUBLINGUAL | Status: DC | PRN

## 2010-08-04 MED ORDER — METOPROLOL TARTRATE 25 MG PO TABS: 25.0000 mg | ORAL_TABLET | Freq: Two times a day (BID) | ORAL | Status: DC

## 2010-08-04 NOTE — Progress Notes (Signed)
  Subjective:    Patient ID: Priscilla [name], female    DOB: November 26, 1950, 60 y.o.   MRN: [id number]  HPI  60 year old patient who has a history of coronary artery disease. She is status post angioplasty in the past. 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 This was associated with some diaphoresis and she was described as being pale. She does walk and is fairly active and denies any exertional symptoms. Yesterday while at work she again had similar shoulder upper back discomfort associated with tachycardia diaphoresis and paleness. She has had no nitroglycerin to take. Prior to her angioplasty she presented with jaw pain. She states that she has had no cardiac stress test since her angioplasty. She has multiple risk factors including diabetes dyslipidemia.    Review of Systems  Constitutional: Negative.   HENT: Negative for hearing loss, congestion, sore throat, rhinorrhea, dental problem, sinus pressure and tinnitus.   Eyes: Negative for pain, discharge and visual disturbance.  Respiratory: Negative for cough and shortness of breath.   Cardiovascular: Negative for chest pain, palpitations and leg swelling.       2 episodes of upper back and shoulder discomfort  Gastrointestinal: Negative for nausea, vomiting, abdominal pain, diarrhea, constipation, blood in stool and abdominal distention.  Genitourinary: Negative for dysuria, urgency, frequency, hematuria, flank pain, vaginal bleeding, vaginal discharge, difficulty urinating, vaginal pain and pelvic pain.  Musculoskeletal: Negative for joint swelling, arthralgias and gait problem.  Skin: Negative for rash.  Neurological: Negative for dizziness, syncope, speech difficulty, weakness, numbness and headaches.  Hematological: Negative for adenopathy.  Psychiatric/Behavioral: Negative for behavioral problems, dysphoric mood and agitation. The patient is not  nervous/anxious.        Objective:   Physical Exam  Constitutional: She is oriented to person, place, and time. She appears well-developed and well-nourished.  HENT:  Head: Normocephalic.  Right Ear: External ear normal.  Left Ear: External ear normal.  Mouth/Throat: Oropharynx is clear and moist.  Eyes: Conjunctivae and EOM are normal. Pupils are equal, round, and reactive to light.  Neck: Normal range of motion. Neck supple. No thyromegaly present.  Cardiovascular: Normal rate, regular rhythm, normal heart sounds and intact distal pulses.   Pulmonary/Chest: Effort normal and breath sounds normal.  Abdominal: Soft. Bowel sounds are normal. She exhibits no mass. There is no tenderness.  Musculoskeletal: Normal range of motion.  Lymphadenopathy:    She has no cervical adenopathy.  Neurological: She is alert and oriented to person, place, and time.  Skin: Skin is warm and dry. No rash noted.  Psychiatric: She has a normal mood and affect. Her behavior is normal.          Assessment & Plan:   History bilateral shoulder pain associated with diaphoresis and paleness. History of coronary artery disease. Rule out angina;  We'll check an EKG today and and if  normal, set up a Cardiolite stress test for risk stratification. We'll place on daily aspirin and add low-dose metoprolol to her regimen. We'll also provide a prescription for nitroglycerin.Maryclare Labrador check a troponin level today

## 2010-08-08 ENCOUNTER — Ambulatory Visit (HOSPITAL_COMMUNITY): Payer: 59 | Attending: Internal Medicine | Admitting: Radiology

## 2010-08-08 DIAGNOSIS — R0789 Other chest pain: Secondary | ICD-10-CM | POA: Insufficient documentation

## 2010-08-08 DIAGNOSIS — R079 Chest pain, unspecified: Secondary | ICD-10-CM

## 2010-08-08 DIAGNOSIS — I2510 Atherosclerotic heart disease of native coronary artery without angina pectoris: Secondary | ICD-10-CM

## 2010-08-08 DIAGNOSIS — E119 Type 2 diabetes mellitus without complications: Secondary | ICD-10-CM

## 2010-08-08 MED ORDER — TECHNETIUM TC 99M TETROFOSMIN IV KIT
33.0000 | PACK | Freq: Once | INTRAVENOUS | Status: AC | PRN
  Administered 2010-08-08: 33 via INTRAVENOUS

## 2010-08-08 MED ORDER — TECHNETIUM TC 99M TETROFOSMIN IV KIT
10.8000 | PACK | Freq: Once | INTRAVENOUS | Status: AC | PRN
  Administered 2010-08-08: 11 via INTRAVENOUS

## 2010-08-08 MED ORDER — REGADENOSON 0.4 MG/5ML IV SOLN
0.4000 mg | Freq: Once | INTRAVENOUS | Status: AC
  Administered 2010-08-08: 0.4 mg via INTRAVENOUS

## 2010-08-08 NOTE — Progress Notes (Signed)
[facility] SITE 3 NUCLEAR MED [address] [place] [address] [phone]  Cardiology Nuclear Med Study  Priscilla [name] is a 60 y.o. female [id number] 1950/08/25   Nuclear Med Background Indication for Stress Test:  Evaluation for Ischemia History: 06/04 Angioplasty, Asthma, 10/08 Stress Echo EF 55-65% NL and 06/04 Heart Catheterization EF 65% NL Cardiac Risk Factors: Hypertension, IDDM Type 2, Lipids and Obesity  Symptoms:  Chest Pain, Diaphoresis and DOE   Nuclear Pre-Procedure Caffeine/Decaff Intake:  None NPO After: 10:00pm   Lungs:  clear IV 0.9% NS with Angio Cath:  22g  IV Site: R Hand  IV Started by:  Cathlyn Parsons, RN  Chest Size (in):  40 Cup Size: D  Height: 5' 4.5" (1.638 m)  Weight:  187 lb (84.823 kg)  BMI:  Body mass index is 31.60 kg/(m^2). Tech Comments:  Metoprolol held x 13 hrs. BS this am 106 @630am ;no diabetic meds taken.  This patient was  Changed to a walking Lexiscan. The patient takes a beta blocker and was unable to get her heart rate up.    Nuclear Med Study 1 or 2 day study: 1 day  Stress Test Type:  Treadmill/Lexiscan  Reading MD: Kristeen Miss, MD  Order Authorizing Provider:  P.Kwaitkowski  Resting Radionuclide: Technetium 42m Tetrofosmin  Resting Radionuclide Dose: 10.8 mCi   Stress Radionuclide:  Technetium 20m Tetrofosmin  Stress Radionuclide Dose: 33.0 mCi           Stress Protocol Rest HR: 51 Stress HR: 117  Rest BP: 103/63 Stress BP: 113/65  Exercise Time (min): 8:30 METS: 8.3   Predicted Max HR: 160 bpm % Max HR: 73.12 bpm Rate Pressure Product: 95284   Dose of Adenosine (mg):  n/a Dose of Lexiscan: 0.4 mg  Dose of Atropine (mg): n/a Dose of Dobutamine: n/a mcg/kg/min (at max HR)  Stress Test Technologist: Milana Na, EMT-P  Nuclear Technologist:  Domenic Polite, CNMT     Rest Procedure:  Myocardial perfusion imaging was performed at rest 45 minutes following the intravenous administration  of Technetium 93m Tetrofosmin. Rest ECG: Sinus Bradycardia  Stress Procedure:  The patient received IV Lexiscan 0.4 mg over 15-seconds with concurrent low level exercise and then Technetium 30m Tetrofosmin was injected at 30-seconds while the patient continued walking one more minute.  There were non specific changes with Lexiscan.  Quantitative spect images were obtained after a 45-minute delay. Stress ECG: No significant change from baseline ECG  QPS Raw Data Images:  Normal; no motion artifact; normal heart/lung ratio. Stress Images:  Normal homogeneous uptake in all areas of the myocardium. Rest Images:  Normal homogeneous uptake in all areas of the myocardium. Subtraction (SDS):  No evidence of ischemia. Transient Ischemic Dilatation (Normal <1.22):  1.06 Lung/Heart Ratio (Normal <0.45):  0.29  Quantitative Gated Spect Images QGS EDV:  65 ml QGS ESV:  17 ml QGS cine images:  NL LV Function; NL Wall Motion QGS EF: 74%  Impression Exercise Capacity:  Good exercise capacity. BP Response:  Normal blood pressure response. Clinical Symptoms:  No chest pain. ECG Impression:  No significant ST segment change suggestive of ischemia. Comparison with Prior Nuclear Study: No previous nuclear study performed  Overall Impression:  Normal stress nuclear study.  No evidence of ischemia.  Normal LV function.   Elyn Aquas.

## 2010-08-09 NOTE — Progress Notes (Signed)
ROUTED TO DR. KWIATKOWSKI.Falecha Clark ° °

## 2010-08-23 NOTE — Procedures (Signed)
[name], Priscilla [name]             ACCOUNT NO.:  [id number]   MEDICAL RECORD NO.:  [id number]          PATIENT TYPE:  OUT   LOCATION:  SLEE                          FACILITY:  [facility]   PHYSICIAN:  Kofi A. Gerilyn Pilgrim, M.D. DATE OF BIRTH:  1950/09/07   DATE OF PROCEDURE:  08/02/2007  DATE OF DISCHARGE:  08/02/2007                             SLEEP DISORDER REPORT   INDICATION:  This is a 60 year old female who presents with snoring and  is being evaluated for obstructive sleep apnea syndrome.  BMI 31.   EPWORTH SLEEPINESS SCALE:  7   MEDICATION:  Lisinopril, hydrochlorothiazide, diltiazem, Ambien,  insulin, Proventil, Lipitor, Actos and Xanax.   SLEEP STAGE SUMMARY:  The total recording time is 422 minutes.  Sleep  efficiency 93%, sleep latency 4.5 minutes, REM latency 317 minutes.  Stage N1 12%, N2 62%, N3 14% and REM sleep 10%.   RESPIRATORY SUMMARY:  Baseline oxygen saturation 98%, lowest saturation  is 77 during REM sleep.  AHI is 31 with 118 hypopneas, two obstructive  apneas, 11 central apneas, and one mixed apnea.   ELECTROCARDIOGRAM SUMMARY:  Average heart rate is 73.  The patient is  noted to have occasional PACs.   LIMB MOVEMENT SUMMARY:  The PLM index is 4.7.   IMPRESSION:  Moderate obstructive sleep apnea syndrome.   RECOMMENDATION:  Formal titration study.   Thanks for this referral.      Kofi A. Gerilyn Pilgrim, M.D.     KAD/MEDQ  D:  08/10/2007  T:  08/10/2007  Job:  [id number]

## 2010-08-23 NOTE — Op Note (Signed)
Priscilla [name], Priscilla [name]             ACCOUNT NO.:  [id number]   MEDICAL RECORD NO.:  [id number]          PATIENT TYPE:  AMB   LOCATION:  DSC                          FACILITY:  [facility]   PHYSICIAN:  Dionne Ano. Gramig, M.D.DATE OF BIRTH:  07-17-1950   DATE OF PROCEDURE:  02/13/2008  DATE OF DISCHARGE:                               OPERATIVE REPORT   PREOPERATIVE DIAGNOSES:  1. Left carpometacarpal arthritis with failed conservative management      and end-stage disease.  2. Bilateral carpal tunnel syndrome.   POSTOPERATIVE DIAGNOSES:  1. Left carpometacarpal arthritis with failed conservative management      and end-stage disease.  2. Bilateral carpal tunnel syndrome.   PROCEDURES:  1. Right carpal tunnel injection.  2. Left open carpal tunnel release.  3. Left carpometacarpal arthroplasty (removal of trapezium at the      basilar thumb joint, left upper extremity).  4. Abductor pollicis longus digastric portion tendon transfer to the      first metacarpal flexor carpi radialis and back upon itself      (Zancolli tendon transfer), left basilar thumb joint.  5. Left abductor pollicis longus.  One third proper portion tendon      transferred to the flexor carpi radialis, abductor pollicis longus      proper back upon itself with multiple figure-of-eight throws      (Weilby tendon transfer).  6. Abductor pollicis longus tenodesis (shortening of wrist extensor at      the wrist forearm level to prevent dorsolateral escape), left      basilar thumb joint.   SURGEON:  Dionne Ano. Amanda Pea, MD   ASSISTANT:  Karie Chimera, PA-C   COMPLICATIONS:  None.   ANESTHESIA:  Block general anesthesia.   TOURNIQUET TIME:  Less than an hour.   INDICATIONS FOR PROCEDURE:  This patient is a pleasant female presents  with aforementioned diagnosis.  I have discussed in regards to risks and  benefits of surgery including risk of infection, bleeding anesthesia,  damage to neuromuscular structures and  failure of the surgery to  accomplish its intended goals of relieving symptoms and restoring  function.  With all issues in mind, she desired to proceed.  All  questions have been encouraged and answered preoperatively.  The patient  and I have discussed pros and cons, risks and benefits, etc.  With all  issues in mind, she desired to proceed.   OPERATIVE PROCEDURE IN DETAIL:  The patient was seen by myself and  Anesthesia, taken to the operative suite, and underwent a smooth  induction of general anesthesia.  She was laid supine, fully padded,  prepped and draped in the usual sterile fashion.  Preoperatively, the  arm was marked.  The permit was signed and the patient had a  preoperative block placed.  Once this was performed, the patient then  underwent a thorough prep and drape.  I then performed a 1-inch incision  at the distal edge of the transverse carpal ligament.  I did not cross  the proximal or distal wrist crease.  Dissection was carried down.  Palmar fascia was identified and  incised longitudinally.  The transverse  carpal ligament was then identified and released distally.  Fat pad  egressed.  Distal proximal dissection was then carried out until  adequate room was available to slide scissors tips to release the  proximal leaflet and portions of the antebrachial fascia, which was done  without difficulty.  The patient tolerated this well.  There were no  complicating features.   Following this, I irrigated copiously.  Obtained hemostasis with bipolar  electrocautery and closed the wound with Prolene.  Once this was done,  attention was turned towards the dorsal radial aspect of the thumb CMC  joint.  Dissection was carried down.  Interval between EPB and APL was  created.  The radial artery in its deep branch as well as the  superficial radial nerve and its branches were identified and swept out  of harm's way.  I then entered the [facility] region, retracted back the  capsule,  and excised the trapezium piecemeal without difficulty.  The  FCR underwent a tenolysis and tenosynovectomy.  I made a drill hole  dorsal to palmar in the first metacarpal exiting intra-articularly in  the line with the palmar beak ligament.  This was then irrigated and  completed the arthroplasty portion of the procedure.   I then made a counterincision about the dorsal third of the forearm.  The APL digastric portion and one-third proper portion of the APL tendon  itself were severed and the retrieved distally.  Once this was done, I  then performed a digastric portion tendon transfer to the first  metacarpal through the drill hole exiting palmar leak in the intra-  articular region.  I then swept it around the FCR twice and through a  slit in this and then back upon itself and the capsular investments.  This was sutured with FiberWire to secure the transfer and this  completed the Zancolli tendon transfer.   Following this, I then performed tendon transfer of the one-third proper  portion of the APL to the FCR back upon the APL proper.  This was done  without difficulty.  The patient tolerated this well and there were no  complicating features.  Multiple figure-of-eight throws were placed and  FiberWire was used to inset this nicely completing the Weilby tendon  transfer.  Following this, I then performed a tenodesis of the APL.  This was done with FiberWire suture without difficulty.  The patient  tolerated this well.  This was done to prevent dorsolateral escape and  was a shortening of the wrist extensor at the wrist forearm level.   Following this, I performed a complex capsular imbrication with  FiberWire and the patient tolerated this well.  I was pleased with the  findings.  I should note the first dorsal compartment was released  somewhat during the procedure and was part of the exposure.   Following this, I then placed Gelfoam in the defect, which was done  prior to  capsular closure, and I irrigated copiously.  The wounds were  closed with Prolene.   She was dressed sterilely and thumb spica splint was applied.  She had  excellent refill soft compartments.  No complicating features.  Following this, I then performed a very careful injection of the right  carpal tunnel.  This was done about the ulnar bursa taking care to avoid  the neurovascular structures ulnarly and the median nerve.  The patient  tolerated this well.   She was taken to recovery room.  She will be monitored.  We will plan  for overnight stay, IV antibiotics, CPAP precautions, pain management,  etc.  I have discussed all issues with the family and all questions  encouraged and answered.      Dionne Ano. Amanda Pea, M.D.  Electronically Signed     WMG/MEDQ  D:  02/13/2008  T:  02/14/2008  Job:  [id number]

## 2010-08-23 NOTE — Assessment & Plan Note (Signed)
[facility]                            CARDIOLOGY OFFICE NOTE   Priscilla [name], Priscilla [name] Priscilla [name]                    MRN:          [id number]  DATE:01/11/2007                            DOB:          1950-07-19    PRIMARY CARE PHYSICIAN:  Priscilla [name], M.D.   ENDOCRINOLOGIST:  Priscilla [name], M.D.   REASON FOR EVALUATION:  Dyspnea and tachycardia.   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  She has a history of hypertension, hyperlipidemia, diabetes,  and morbid obesity.  She actually underwent cardiac catheterization by  Dr. Yates [name] in 2004, for chest pain which showed normal coronary  arteries and normal left ventricular function and normal right sided  filling pressures.  She has not been having chest pain recently.  However for the past year  or so she notes that her heart rate has been elevated.  She says that  when she is sitting down it is usually in the 80s.  However, when she  gets up to walk it very quickly goes up to 100-115 range.  She is able  to do all her activities of daily living without much trouble bit with  mild activity she does get short of breath and can start wheezing.  She  is concerned about exercise-induced asthma.  She does exercise on a Nu-  Step for about 20 minutes three times a week at work but is unable to do  more strenuous exercise like the elliptical trainer as she gets short of  breath and feel like she is wheezing.  She denies any orthopnea or PND.  She does have mild snoring but this  does not appear to be too severe.  No syncope or presyncope.   REVIEW OF SYSTEMS:  Notable for arthritis, mild depression, fatigue,  anemia.  The remainder of the review of systems is negative except for  HPI and problem list.   PROBLEM LIST:  1. History of chest pain with normal cardiac catheterization in 2004.  2.  Morbid obesity.  3. Hypertension.  4. Hyperlipidemia.  5. Diabetes.  6. Gallstones status post cholecystectomy.   CURRENT MEDICATIONS:  1. Levemir.  2. Lipitor 10 mg a day.  3. Xanax 0.25 mg a day.  4. Lisinopril/HCTZ 20/12.5 a day.  5. Proventil inhaler.  6. Actos plus metformin 15/850 b.i.d.  7. Byetta pen 10/0.04 b.i.d.  8. Cymbalta 60 a day.  9. Diltiazem 180 a day.  10.Multivitamin.  11.Calcium.   ALLERGIES:  No known drug allergies.   SOCIAL HISTORY:  She is married with 2 children.  She is a physical  therapy assistant.  She denies any tobacco use and occasional alcohol.   FAMILY HISTORY:  Mother died at 45 due to Alzheimer's disease.  Father  died at 2 due to a stroke.  Sister is alive and well with a history of  diabetes and obesity.   PHYSICAL EXAMINATION:  GENERAL:  She is a very pleasant woman in no  acute  distress, ambulates around the clinic without any respiratory  difficulty.  VITAL SIGNS:  Blood pressure is 112/66, heart rate is 86, weight is 237.  HEENT:  Normal.  NECK:  Thick.  It is hard to assess her JVD but appears flat.  There is  no lymphadenopathy or thyromegaly appreciated.  Carotids are 2 plus  bilaterally without bruits.  CARDIAC:  She has a regular rate and rhythm.  No murmurs, rubs, or  gallops.  PMI is not palpable.  LUNGS:  Clear with no wheezing or rales.  ABDOMEN:  Obese, nontender, nondistended.  There is no appreciable  hepatosplenomegaly though the exam is limited.  No bruits or masses  appreciated.  EXTREMITIES:  Warm with no cyanosis or clubbing.  There is trivial  edema.  No rash.  NEUROLOGIC:  Alert and oriented  x3.  Cranial nerves II-XII are intact.  Moves all 4 extremities without difficulty.  Affect is pleasant.   EKG shows a normal sinus rhythm with no significant ST-T wave  abnormalities.  Rate is 86.  Normal axis and intervals.   ASSESSMENT/PLAN:  1. Dyspnea and tachycardia.  I suspect these are mostly related to       deconditioning.  We will go ahead and get a cardiopulmonary      exercise test to evaluate her exercise capacity and also rule out      any exercise-induced asthma.  Should this be within normal limits,      I strongly suggested that she get a personal trainer at the [facility],      who can help her improve her fitness level.  We will also get a 2D      echocardiogram to make sure she has no structural heart disease or      valvular disease.  Obviously, weight loss would also be a plus.  2. Hypertension, well controlled.  3. Hyperlipidemia.  This is followed by Dr. Amador [name].   DISPOSITION:  Return to clinic in several months pending the results of  her workup.     Priscilla [name]. Bensimhon, MD  Electronically Signed    DRB/MedQ  DD: 01/11/2007  DT: 01/11/2007  Job #: [id number]   cc:   Priscilla Savers, MD  Priscilla [name], M.D.

## 2010-08-23 NOTE — Procedures (Signed)
Priscilla [name], Priscilla [name]             ACCOUNT NO.:  [id number]   MEDICAL RECORD NO.:  [id number]          PATIENT TYPE:  OUT   LOCATION:  SLEE                          FACILITY:  [facility]   PHYSICIAN:  Kofi A. Gerilyn Pilgrim, M.D. DATE OF BIRTH:  [birth year]   DATE OF PROCEDURE:  08/02/2007  DATE OF DISCHARGE:  08/02/2007                             SLEEP DISORDER REPORT   INDICATION:  This is a 60 year old female who presents with snoring and  is being evaluated for obstructive sleep apnea syndrome.  BMI 31.   EPWORTH SLEEPINESS SCALE:  7   MEDICATION:  Lisinopril, hydrochlorothiazide, diltiazem, Ambien,  insulin, Proventil, Lipitor, Actos and Xanax.   SLEEP STAGE SUMMARY:  The total recording time is 422 minutes.  Sleep  efficiency 93%, sleep latency 4.5 minutes, REM latency 317 minutes.  Stage N1 12%, N2 62%, N3 14% and REM sleep 10%.   RESPIRATORY SUMMARY:  Baseline oxygen saturation 98%, lowest saturation  is 77 during REM sleep.  AHI is 31 with 118 hypopneas, two obstructive  apneas, 11 central apneas, and one mixed apnea.   ELECTROCARDIOGRAM SUMMARY:  Average heart rate is 73.  The patient is  noted to have occasional PACs.   LIMB MOVEMENT SUMMARY:  The PLM index is 4.7.   IMPRESSION:  Moderate obstructive sleep apnea syndrome.   RECOMMENDATION:  Formal titration study.   Thanks for this referral.      Kofi A. Gerilyn Pilgrim, M.D.  Electronically Signed     KAD/MEDQ  D:  08/10/2007  T:  08/10/2007  Job:  [id number]

## 2010-08-26 ENCOUNTER — Ambulatory Visit (INDEPENDENT_AMBULATORY_CARE_PROVIDER_SITE_OTHER): Payer: 59 | Admitting: Internal Medicine

## 2010-08-26 ENCOUNTER — Encounter: Payer: Self-pay | Admitting: Internal Medicine

## 2010-08-26 DIAGNOSIS — Z8679 Personal history of other diseases of the circulatory system: Secondary | ICD-10-CM

## 2010-08-26 DIAGNOSIS — E119 Type 2 diabetes mellitus without complications: Secondary | ICD-10-CM

## 2010-08-26 DIAGNOSIS — I10 Essential (primary) hypertension: Secondary | ICD-10-CM

## 2010-08-26 NOTE — Discharge Summary (Signed)
NAMESOLYMAR, Priscilla [name]             ACCOUNT NO.:  [id number]   MEDICAL RECORD NO.:  [id number]          PATIENT TYPE:  INP   LOCATION:  5508                         FACILITY:  [facility]   PHYSICIAN:  Madaline Guthrie, M.D.    DATE OF BIRTH:  [birth year]   DATE OF ADMISSION:  10/04/2005  DATE OF DISCHARGE:  10/09/2005                                 DISCHARGE SUMMARY   DISCHARGE DIAGNOSES:  1.  Infectious mononucleosis, likely CMV with positive IgG EIA of 1.71.  2.  Hypertension.  3.  Type 2 diabetes mellitus.  4.  Anxiety disorder.   DISCHARGE MEDICATIONS:  1.  Ibuprofen 600 mg one tablet t.i.d. p.r.n.  2.  Lantus 45 units q.h.s.  3.  Lisinopril 20/HCTZ 12.5 one tablet daily.  4.  Cymbalta 60 mg daily.  5.  Diltiazem 180 mg one tablet daily.  6.  Byetta 5 units b.i.d. injection.  7.  Estradiol one tablet daily.  8.  Tylenol over-the-counter p.r.n. pain.   FOLLOWUP:  1.  Dr. Corliss Skains, October 12, 2005, rheumatology.  2.  Dr. Sherlon Handing, outpatient clinic on October 25, 2005, at 12:30.  3.  Dr. Roxan Hockey, infectious disease, November 06, 2005, at 11:30.   CONSULTATIONS:  Infectious disease.   PROCEDURE:  1.  Bilateral duplex lower extremity ultrasound demonstrating no evidence of      DVT, superficial thrombosis, or Baker's cyst, September 27, 2005.  2.  CT scan of the head without contrast, negative for bleed on October 06, 2005.   CONDITION ON DISCHARGE:  Stable.   HISTORY OF PRESENT ILLNESS:  Priscilla [name] is a 60 year old Caucasian female  who was transferred from [facility] for admission on September 28, 2005,  with worsening acute exertional fatigue and dyspnea with palpitations and  fevers for one week, also diaphoresis, extreme fevers and chills, and no  symptom change despite antibiotic therapy.  In addition, the patient was  found to be hypotensive and orthostatic with sinus tachycardia.  Urinalysis  was positive for slight urinary tract infection, although the patient was  asymptomatic related to this.  The patient had been treated with Rocephin  and azithromycin, but because of persistent fevers, requesting transfer to  [facility] for infectious disease evaluation was made.   HOME MEDICATIONS:  1.  Lantus 45 units daily.  2.  Avandamet 07/998 b.i.d.  3.  HCTZ/Lisinopril 12.5/20 mg daily.  4.  Cymbalta daily.  5.  Provigil 200 mg daily.  6.  Simvastatin 40 mg daily.  7.  Diltiazem 180 mg daily.  8.  Estradiol one tablet daily.  9.  Byetta 5 units injection b.i.d.  10. Albuterol meter dosed inhaler p.r.n.   SOCIAL HISTORY:  The patient is married, currently working as a Designer, multimedia.  Denied any smoking, IV drugs, cocaine use, occasional  alcohol.   ADMISSION PHYSICAL EXAMINATION:  VITAL SIGNS:  Temperature 102.7, blood  pressure 108/64, pulse 114, respirations 20, O2 saturation 95% on room air,  weight 98.3 kg.  GENERAL:  Examination pertinent for significant fatigue and lethargy.  LUNGS:  Clear  to auscultation bilaterally with no wheezes, rhonchi, or  rales.  CARDIOVASCULAR:  Regular rate and rhythm.  No murmurs, rubs, or gallops.  ABDOMEN:  Soft, nontender, nondistended, no CVA tenderness, no rebound or  guarding.  EXTREMITIES:  No cyanosis, clubbing, or edema.  SKIN:  No evidence of rash, petechiae, or other abnormalities.  NEUROLOGIC:  Cranial nerves II-XII intact.  Musculoskeletal strength 5/5 in  all extremities.  There was no gait disturbance, no ataxia, or other unusual  pathology.   ADMISSION LABORATORY DATA:  Laboratory studies from [facility]  included a sodium of 135, potassium 3.7, chloride 105, bicarbonate 27, BUN  2, creatinine 0.7, glucose 188.  Hemoglobin 11.4, platelets 201, white blood  cell count 6.9.  Sedimentation rate was 25.  TSH 2.28.  Urinalysis was small  leukocyte esterase and 5 to 10 white blood cells with trace protein.  Cortisol was 20.4.  AST 96, ALT 114.  Albumin 2.4.  Calcium 7.4.   Alkaline  phosphatase 128, bilirubin 0.3.  Microbiology data which was pending at the  time of admission showed Lyme antibody IgM which is pending.  Mono test  which was negative.  [place] Spotted Fever IgM was pending.  HIV test  pending.  Blood cultures on June 24 and October 03, 2005, were pending at the  time of admission.   HOSPITAL COURSE:  PROBLEM #1 -  FATIGUE, MYALGIAS, AND ELEVATED  TRANSAMINASES:  The patient was admitted to a general floor and telemetry  continued.  She was started on vancomycin and Zosyn pending culture results.  She was noticed to have a past history of fibromyalgia as well.  Repeat mono  spot test was performed which was negative.  Hepatitis panel also was  obtained which was unremarkable.  In light of the patient's fever, duplex  venous ultrasound was obtained which was negative for DVT.  The patient  continued to have some persisting fevers.  Infectious disease was consulted  and through excellent history, it was noted the patient cared for three  grandchildren who were in day care, and in the setting of fevers, rigors,  sweats, anorexia, vomiting, reactive leukocytosis, and mild transaminase  elevation, suspicion for CMV mononucleosis was made.  Therefore, serologies  were obtained which were not available at the time the patient was  discharged.  However, at the time of dictation, IgM was positive for CMV.  Once these results were obtained, the patient was discontinued off all  antibiotic therapy.  She was given p.r.n. non-steroidal anti-inflammatory  medications with remarkable improvement to her fevers and myalgias.  Over  the course of the next few days, the patient's blood pressure and other  vital signs had stabilized and she was advised she would need at least two  to three weeks recovery time frame in the setting of her CMV infection.  In  addition, Epstein-Barr virus antibody was also obtained, but was positive for IgG only and not IgM,  thus confirming a significant CMV infection.  Otherwise, by the day of discharge, the patient's liver function testing  showed an AST of 99, ALT of 124, albumin of 1.7, with an alkaline  phosphatase of 222.  Her hemoglobin was 11.4, white blood cell count was 8,  platelets of 224.  In addition, CBC smear was performed which only showed a  few reactive leukocytes, but no evidence for other pathology.   PROBLEM #2 -  TYPE 2 DIABETES:  The patient was continued on Lantus therapy  and sliding  scale, and at no time did she have any significant exacerbation  of her diabetes.  Her hemoglobin A1C was measured at 7.1% during this  admission.  On the day of discharge, she was advised to follow up in our  outpatient clinic, but was advised to resume Byetta injections in addition  to her Lantus therapy.   PROBLEM #3 -  HYPERTENSION:  The patient's blood pressure stabilized after  adequate hydration, and by the day of discharge, she was advised to restart  her HCTZ and Lisinopril, as well as Diltiazem therapy for blood pressure  monitoring.   PROBLEM #4 -  HISTORY OF SURGICAL MENOPAUSE, STATUS POST HYSTERECTOMY AND  BILATERAL SALPINGO-OOPHORECTOMY:  The patient has been on estradiol therapy  for at least five years.  She was discussed the risks and benefits  associated with this medication to include DVT and pulmonary embolism risk.  Further discussion related to continuing this medication will be performed  in the outpatient setting.   DISCHARGE LABORATORY DATA:  Creatinine 0.8, BUN 4.  AST 99, ALT 124, albumin  1.7, calcium 7.5, alkaline phosphatase 222.  Hemoglobin 11.4, hematocrit  33.2, platelets 224, white blood cell count 8.  Hemoglobin A1C 7.1%.  CMV  antibody IgM ELISA amino assay 1.71, which was elevated.  Blood cultures  negative x2.  Lipase 20, which was negative.      Coralie Carpen, M.D.  Electronically Signed      Madaline Guthrie, M.D.  Electronically Signed    FR/MEDQ   D:  10/11/2005  T:  10/11/2005  Job:  [id number]   cc:   Rockey Situ. Flavia Shipper., M.D.  Fax: [phone]   Kathryne Hitch, MD  Fax: [phone]   C. Ulyess Mort, M.D.  Fax: [phone]

## 2010-08-26 NOTE — Op Note (Signed)
[facility]  Patient:    Priscilla [name], Priscilla [name]                    MRN: [id number] Proc. Date: 12/15/99 Adm. Date:  [id number] Attending:  Lendon Colonel                           Operative Report  PREOPERATIVE DIAGNOSIS:       Menorrhagia and anemia.  POSTOPERATIVE DIAGNOSIS:      Menorrhagia and anemia.  OPERATION:                    Hysteroscopy with endometrial resection.  SURGEON:                      Katherine Roan, M.D.  DESCRIPTION OF PROCEDURE:     The patient was placed in the lithotomy position.  The osmotic dilator was removed after prep and drape.  The bladder was emptied.  The cervix was dilated and the hysteroscope was inserted into the uterus.  The endometrial cavity was resected using the resectoscope.  All of the resected material was sent to the lab for study.  The patient was awakened and carried to the recovery room in good condition. DD:  12/15/99 TD:  12/17/99 Job: [id number]

## 2010-08-26 NOTE — Assessment & Plan Note (Signed)
[facility] OFFICE NOTE   NAME:[name], [name]                    MRN:          [id number]  DATE:06/19/2006                            DOB:          [birth year]    A 60 year old white female who seen today to establish with our  practice. She has an appropriate 20-year history of diabetes and 10-year  history of hypertension. She is followed by Dr. Lucianne Muss and her diabetic  regimen recently adjusted. She has osteoarthritis and a history of  psoriatic arthritis, hypercholesterolemia and fibromyalgia. She has  recently been started on statin therapy. She has a history of prior  hysterectomy in 2000 and a cholecystectomy in 2004. 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   REVIEW OF SYSTEMS:  Is unremarkable. No prior colonoscopy. She has had a  total hysterectomy. Does obtain annual mammograms and eye exams.   SOCIAL HISTORY:  She is married. 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   FAMILY HISTORY:  Father died at 19 from complications of a stroke. She  had alcohol use, diabetes and hypertension. Mother died at 65 of  complications of senile dementia. One sister is slightly disabled due to  measles encephalitis.   Exam revealed an overweight white female, very pleasant, no acute  distress. Blood pressure was low normal.  Fundi, ear, nose and throat clear.  NECK:  No adenopathy or thyroid enlargement.  CHEST:  Was clear.  BREASTS:  Negative.  CARDIOVASCULAR EXAM:  Normal heart sounds. No murmurs.  ABDOMEN:  Obese, soft, and nontender. No organomegaly.  EXTREMITIES:  Negative. The right posterior tibial pulse was slightly  diminished. Sensation intact to vibration and monofilament testing.   IMPRESSION:  1. Diabetes.  2. Hypertension.  3. Obesity.  4. Dyslipidemia.  5. History of peripheral neuropathy.  6. Fibromyalgia.   DISPOSITION:  Medical regimen unchanged. Will continue her close  followup with her endocrinologist. Will recheck here in 6 months.     Gordy Savers, MD  Electronically Signed    PFK/MedQ  DD: 06/19/2006  DT: 06/21/2006  Job #: [id number]

## 2010-08-26 NOTE — H&P (Signed)
[facility]  Patient:    Priscilla [name], Priscilla [name]               MRN: [id number] Adm. Date:  [id number] Attending:  Lendon Colonel                         History and Physical  HISTORY OF PRESENT ILLNESS:  This patient is a 60 year old female gravida 2, para 2 who is status post hysteroscopy with resection for heavy painful periods.  Complains of continued heavy painful periods which are resistant to oral contraceptive therapy and hysteroscopy.  Her comorbidity is that she is mildly hypertensive and obese and is an adult onset diabetic using oral hypoglycemics.  The stress incontinence is mild but she desires for this to be corrected.  Because of the continued heavy painful periods, hysterectomy was recommended.  Most recently, she was in the hospital for what appears to be a viral syndrome.  She is well at this time.  She is currently on Keflex.  PAST MEDICAL HISTORY:  Other than hysterectomy, two uncomplicated births and a gallbladder surgery.  REVIEW OF SYSTEMS:  HEENT:  She wears glasses but no decrease in vision or auditory acuity.  No headaches or dizziness.  HEART:  No history of chest pain or shortness of breath.  She is hypertensive and under excellent control. LUNGS:  Negative.  No chronic cough.  No hemoptysis.  GU:  She has stress incontinence but very little urge.  No nocturia.  GI:  She has no bowel habit change, no melena, no weight loss or gain.  She had recent gallbladder surgery in the past.  This was for symptomatic cholelithiasis.  MUSCLE, BONES, AND JOINTS:  No fractures or arthritis.  SOCIAL HISTORY:  She works as a Adult nurse.  Mother and father are both dead.  Her father was diabetic.  She has one sister living and well.  PHYSICAL EXAMINATION:  VITAL SIGNS:  Weight 208 pounds, blood pressure 118/70.  GENERAL:  The patient is oriented, alert.  Appears to be her stated age.  HEENT:  Examination of the ears, nose,  and throat are unremarkable. Oropharynx is not injected.  NECK:  Supple.  Thyroid is not enlarged.  Carotid pulses are equal.  No bruits are heard.  LUNGS:  Clear.  BREASTS:  No masses or tenderness.  HEART:  Normal sinus rhythm.  No murmurs.  ABDOMEN:  Soft and flat.  Liver, spleen, and kidneys are not palpated.  No tenderness.  Bowel sounds are normal.  EXTREMITIES:  Good range of motion.  Equal pulse and reflexes.  PELVIC:  Normal vulva and vagina.  The cervix is clean.  The uterus is about twice normal size.  Appears symmetrical with no masses.  Hemoccult is negative.  IMPRESSION:  Essentially normal examination with uterine enlargement with continued heavy painful periods.  Probable adenomyosis.  Status post hysteroscopy, adult onset diabetes, mild hypertension.  PLAN:  Total abdominal hysterectomy/bilateral salpingo-oophorectomy with Burch.  Detailed informed consent has been given to the patient.  DD:  08/30/00 TD:  08/30/00 Job: [id number]

## 2010-08-26 NOTE — Patient Instructions (Signed)
Return in 3 months for follow-up  

## 2010-08-26 NOTE — Op Note (Signed)
[facility]  Patient:    Priscilla [name], Priscilla [name]               MRN: [id number] Proc. Date: 08/30/00 Adm. Date:  [id number] Disc. Date: [id number] Attending:  Lendon Colonel                           Operative Report  PREOPERATIVE DIAGNOSES:  Persistent menorrhagia and dysmenorrhea, stress incontinence.  POSTOPERATIVE DIAGNOSES:  Persistent menorrhagia and dysmenorrhea, stress incontinence.  OPERATION PERFORMED:  Total abdominal hysterectomy and bilateral salpingo-oophorectomy, Burch procedure.  DESCRIPTION OF PROCEDURE:  The patient was placed in lithotomy position and prepped and draped in the usual fashion. A transverse incision was made in the abdomen and extended in layers to the peritoneal cavity which was entered vertically. Exploration of the upper abdomen revealed adhesions in the right upper quadrant from a previous gallbladder surgery. Both kidneys were normal and the part of the liver that was felt appeared normal. The abdominal viscera were then packed away from the pelvic viscera and visualization revealed the uterus to be about twice normal size and symmetrical, both ovaries were normal. Carefully the infundibulopelvic ligaments were skeletonized and ligated with #0 chromic. The round ligaments were transected and suture ligated. A bladder flap was created and the uterine vessels were skeletonized. The cardinals and uterosacral ligaments were then ligated. Fundectomy was performed for better exposure because of the deep pelvis and the cervix was removed using Masterson and Heaney clamps for the paracervical tissue. There was a large plexus of veins on the left side of the cervix. The vagina was then closed with horizontal mattress sutures of #0 Vicryl. Following this, the uterosacral ligaments were sutured in the midline for vault support, hemostasis was secure. All instruments were removed. Sponge and needle count was correct. The  parietoperitoneum was closed with 2-0 PDS. Following this, we dissected the space of Retzius, identified the bladder neck and sutured the bladder neck to the inguinal ligament and shelving edge of Pouparts ligament with sutures of #0 Ethibond. There was no blood in the bladder. The patient tolerated the procedure well. The space of Retzius was irrigated. The fascia was closed with a running locking sutures of 2-0 PDS and interrupted suture of #0 Vicryl. The skin was closed with clips. The incision was then infiltrated with 0.5% Marcaine with epinephrine. Maciel tolerated this procedure well and sent to the recovery room in good condition. DD:  08/30/00 TD:  08/30/00 Job: [id number]

## 2010-08-26 NOTE — Procedures (Signed)
Priscilla [name], Priscilla [name]             ACCOUNT NO.:  [id number]   MEDICAL RECORD NO.:  [id number]          PATIENT TYPE:  OUT   LOCATION:  SLEEP                         FACILITY:  [facility]   PHYSICIAN:  Kofi A. Gerilyn Pilgrim, M.D. DATE OF BIRTH:  1950/10/22   DATE OF PROCEDURE:  DATE OF DISCHARGE:                             SLEEP DISORDER REPORT   REFERRING PHYSICIAN:  Gordy Savers, MD   RECORDING DATE:  November 29, 2007.   MEDICATION:  None listed.   EPWORTH SLEEPINESS SCALE:  11.   BMI:  38.   SLEEP STAGE SUMMARY:  The study has a CPAP titration study.  The total  recording time is 385 minutes.  Sleep efficiency 92%, sleep latency 16  minutes, REM latency 128 minutes, stage N1 8.1%, N2 57.4%, N3 11%, NREM  sleep 23.5%.   THE RESPIRATORY SUMMARY:  The baseline oxygen saturation is 98%, the  lowest saturation is 81%.  The patient was titrated between pressures of  5, 2, 12.  The optimal pressure was 11 however.  Higher pressures  resulted in central apneas.  She did have some central apneas is on  pressure of 11.   THE MOVEMENT SUMMARY:  The TLM index is 54.1.   ELECTROCARDIOGRAM SUMMARY:  Average heart rate 75 with occasional PACs  observed.   IMPRESSION:  1. Obstructive sleep apnea syndrome which responded well to a optimal      pressure of 11.  2. Severe periodic limb movement and disorder of sleep.  Consider      dopamine agonist such as ReQuip or Mirapex.  Thanks for this      referral.      Kofi A. Gerilyn Pilgrim, M.D.  Electronically Signed     KAD/MEDQ  D:  12/03/2007  T:  12/03/2007  Job:  [id number]

## 2010-08-26 NOTE — Progress Notes (Signed)
  Subjective:    Patient ID: Priscilla [name], female    DOB: 1950/05/08, 60 y.o.   MRN: [id number]  HPI A 60 year old patient who is seen today for followup. She was seen last month with atypical chest pain she does have a history of coronary artery disease status post intervention in the past. A subsequent stress Myoview was normal. She was placed on low-dose metoprolol and has felt much better. She has a history of tachycardia and usually her resting pulse rate is elevated she states that she feels much improved on this medication; she feels calmer and states that her exercise capacity is improved. She has a history of diabetes which remains quite stable Review of Systems  Constitutional: Negative.   HENT: Negative for hearing loss, congestion, sore throat, rhinorrhea, dental problem, sinus pressure and tinnitus.   Eyes: Negative for pain, discharge and visual disturbance.  Respiratory: Negative for cough and shortness of breath.   Cardiovascular: Negative for chest pain, palpitations and leg swelling.  Gastrointestinal: Negative for nausea, vomiting, abdominal pain, diarrhea, constipation, blood in stool and abdominal distention.  Genitourinary: Negative for dysuria, urgency, frequency, hematuria, flank pain, vaginal bleeding, vaginal discharge, difficulty urinating, vaginal pain and pelvic pain.  Musculoskeletal: Negative for joint swelling, arthralgias and gait problem.  Skin: Negative for rash.  Neurological: Negative for dizziness, syncope, speech difficulty, weakness, numbness and headaches.  Hematological: Negative for adenopathy.  Psychiatric/Behavioral: Negative for behavioral problems, dysphoric mood and agitation. The patient is not nervous/anxious.        Objective:   Physical Exam  Constitutional: She is oriented to person, place, and time. She appears well-developed and well-nourished.  HENT:  Head: Normocephalic.  Right Ear: External ear normal.  Left Ear: External ear  normal.  Mouth/Throat: Oropharynx is clear and moist.  Eyes: Conjunctivae and EOM are normal. Pupils are equal, round, and reactive to light.  Neck: Normal range of motion. Neck supple. No thyromegaly present.  Cardiovascular: Normal rate, regular rhythm, normal heart sounds and intact distal pulses.   Pulmonary/Chest: Effort normal and breath sounds normal.  Abdominal: Soft. Bowel sounds are normal. She exhibits no mass. There is no tenderness.  Musculoskeletal: Normal range of motion.  Lymphadenopathy:    She has no cervical adenopathy.  Neurological: She is alert and oriented to person, place, and time.  Skin: Skin is warm and dry. No rash noted.  Psychiatric: She has a normal mood and affect. Her behavior is normal.          Assessment & Plan:   Atypical chest pain with normal stress Myoview. Hypertension well controlled. Patient was placed on low-dose metoprolol pending results of her stress Myoview. She feels much better on this medication. We'll continue the metoprolol and discontinue diltiazem. Recheck 3 months

## 2010-08-26 NOTE — Discharge Summary (Signed)
[facility]  Patient:    Priscilla [name], Priscilla [name]               MRN: [id number] Adm. Date:  [id number] Disc. Date: [id number] Attending:  Lendon Colonel                           Discharge Summary  ADMISSION DIAGNOSIS:  Menorrhagia, persistent, unresponsive to oral contraceptive therapy.  HISTORY OF PRESENT ILLNESS:  [name] is a 60 year old female who was status post hysteroscopy.  Complaints of continued heavy painful periods despite oral contraceptive therapy.  She also complains of mild stress incontinence.  Her comorbidities included adult-onset diabetes.  LABORATORY DATA:  The patient was AB positive.  This is the only laboratory study available at this time.  PATHOLOGY:  Pathology report revealed uterine fibroids and adenomyosis. Uterine weight was 232 g.  Serous cystadenoma of the left ovary.  HOSPITAL COURSE:  The patient was admitted to the hospital and underwent an uneventful TAH/BSO hysterectomy and Burch procedure.  Her postoperative course was uncomplicated.  She remained afebrile and without complaints and voided satisfactorily.  On the day of discharge she was without complaints.  She was discharged with a Climera patch 0.1 mg, 1 weekly.  She was given Percocet for pain, Phenergan for nausea, and Ambien for sleep.  She was advised to do home glucose monitoring for at least two days on a regular basis.  She will return to the office in approximately a week.  CONDITION ON DISCHARGE:  Improved. DD:  09/10/00 TD:  09/10/00 Job: [id number]

## 2010-08-26 NOTE — Cardiovascular Report (Signed)
Priscilla [name], Priscilla [name]                         ACCOUNT NO.:  [id number]   MEDICAL RECORD NO.:  [id number]                   PATIENT TYPE:  OIB   LOCATION:  2899                                 FACILITY:  [facility]   PHYSICIAN:  Cristy Hilts. Jacinto Halim, M.D.                  DATE OF BIRTH:  1950-04-23   DATE OF PROCEDURE:  10/02/2002  DATE OF DISCHARGE:                              CARDIAC CATHETERIZATION   REFERRING PHYSICIAN:  Dr. Talmadge Coventry.   PROCEDURES PERFORMED:  1. Left ventriculography.  2. Selective right and left coronary arteriography.  3. Right heart catheterization.  4. Right femoral angiography with closure of right femoral artery access     with Perclose.   INDICATION:  Priscilla [name] is a 60 year old female with a history of  hypertension, diabetes and hyperlipidemia, who has been having recurrent  chest pain and shortness of breath.  Given her multiple cardiac risk  factors, obesity, ongoing chest pain, she was brought directly to the  cardiac catheterization with a high suspicion for coronary disease to  evaluate her coronary anatomy.  The right heart catheterization was  performed because of significant shortness of breath.  The patient was  having significant shortness of breath, even with minimal activity.  The  right heart catheterization was performed to evaluate for suspected  pulmonary hypertension.   HEMODYNAMIC DATA:   LEFT HEART CATHETERIZATION:  The left ventricular pressures were 127/9 with  end-diastolic pressure of 14 mmHg.  The aortic pressure was 127/81 with a  mean of 102 mmHg.  There was no pressure gradient across the aortic valve.   ANGIOGRAPHIC DATA:  Left ventricle:  Left ventricular systolic function was  normal and the ejection fraction was estimated at 65%.  There was no  significant mitral regurgitation.   Right coronary artery:  The right coronary artery is a large-caliber vessel.  It is a dominant vessel and is normal.   Left  ventricle:  The left ventricle is a large-caliber vessel.  It is  normal.   Left anterior descending artery:  The left anterior descending artery is a  large-caliber vessel.  It gives origin to a large diagonal 1 and diagonal 2  and is normal.  It ends at the apex.   Circumflex coronary artery:  The circumflex coronary artery is a large-  caliber vessel.  It gives origin to an obtuse marginal 1 and continues as an  obtuse marginal 2.  It is normal.   ABDOMINAL AORTOGRAM:  Abdominal aortogram revealed no evidence of abdominal  aortic aneurysm.  Renal arteries were widely patent.   RIGHT HEART CATHETERIZATION:  RA pressure is 11/10/7 mmHg.  RV 29/2/6 mmHg.   PA 31/17, mean 26 mmHg.   Pulmonary capillary wedge is 21/20/16 mmHg.   Cardiac output 5.9, cardiac index 2.9.   PA saturation 72%, aortic saturation 94%.   IMPRESSIONS:  1. Normal left ventricular systolic  function and normal left ventricular end-     diastolic pressure.  2. Normal coronary arteries.  3. Normal right heart pressure data with preserved cardiac output and     cardiac index.   RECOMMENDATIONS:  The shortness of breath and chest pain are from noncardiac  etiology.  This is probably secondary to deconditioning and obesity.  Continued risk factor modification and exercise program are indicated.   TECHNIQUE OF PROCEDURE:  Under the usual sterile precautions, using 6-French  right femoral artery access and a 7-French right femoral venous access, left  and right heart catheterizations were performed.   TECHNIQUE OF LEFT HEART CATHETERIZATION:  A 6-French multipurpose P-2  catheter was advanced into the ascending aorta over a 0.035-inch J wire.  The catheter was advanced into the left ventricle and left ventricular  pressures were monitored.  Hand-contrast injections of the left ventricle  were performed, both in LAO and RAO projections.  The catheter was then  flushed with saline and pulled back into ascending  aorta and pressure  gradient across the aortic valve was monitored.  The right coronary artery  was selectively engaged and angiography was performed.  Then the catheter  was pulled back into the abdominal aorta and abdominal aortogram was  performed.  Then the catheter was pulled out of the body.  A 6-French  Judkins left 4 diagnostic catheter was advanced into the ascending aorta in  a similar fashion and the left main coronary artery was selectively engaged  and angiography was performed.  Then the catheter was pulled out of the body  in the usual fashion.   TECHNIQUE OF RIGHT HEART CATHETERIZATION:  A Swan-Ganz catheter was advanced  through the 7-French right femoral sheath.  Pulmonary capillary wedge was  easily obtained.  After obtaining PA saturations and hemodynamics on the  right side, the catheter was pulled out of the body without any  complication.   Right femoral angiography was performed through the arterial access sheath  and the access was closed with Perclose with excellent hemostasis obtained.  The venous sheath was pulled, manual pressure was held, adequate hemostasis  was obtained.  The patient was transferred to the short-stay in a stable  condition.                                                Cristy Hilts. Jacinto Halim, M.D.    Pilar Plate  D:  10/02/2002  T:  10/04/2002  Job:  [id number]  [facility]   cc:   [facility]

## 2010-09-27 ENCOUNTER — Ambulatory Visit (INDEPENDENT_AMBULATORY_CARE_PROVIDER_SITE_OTHER): Payer: 59 | Admitting: Internal Medicine

## 2010-09-27 ENCOUNTER — Encounter: Payer: Self-pay | Admitting: Internal Medicine

## 2010-09-27 VITALS — BP 92/64 | Temp 98.6°F | Wt 184.0 lb

## 2010-09-27 DIAGNOSIS — M545 Low back pain: Secondary | ICD-10-CM

## 2010-09-27 DIAGNOSIS — E119 Type 2 diabetes mellitus without complications: Secondary | ICD-10-CM

## 2010-09-27 DIAGNOSIS — Z87448 Personal history of other diseases of urinary system: Secondary | ICD-10-CM

## 2010-09-27 LAB — POCT URINALYSIS DIPSTICK
Ketones, UA: NEGATIVE
Protein, UA: NEGATIVE
Urobilinogen, UA: 0.2

## 2010-09-27 MED ORDER — CIPROFLOXACIN HCL 500 MG PO TABS: 500.0000 mg | ORAL_TABLET | Freq: Two times a day (BID) | ORAL | Status: AC

## 2010-09-27 NOTE — Patient Instructions (Signed)
Please check your hemoglobin A1c every 3 months Take your antibiotic as prescribed until ALL of it is gone, but stop if you develop a rash, swelling, or any side effects of the medication.  Contact our office as soon as possible if  there are side effects of the medication.  Call or return to clinic prn if these symptoms worsen or fail to improve as anticipated.

## 2010-09-27 NOTE — Progress Notes (Signed)
  Subjective:    Patient ID: Priscilla [name], female    DOB: [birth year], 60 y.o.   MRN: [id number]  HPI  60 year old patient who awoke with the dysuria this morning she has had urinary frequency and dysuria throughout the day. The UA was reviewed and did reveal pyuria. She has a history of type 2 diabetes controlled with metformin therapy and 50 units of Levemir at bedtime. She is no longer taking mealtime insulin. Her last hemoglobin A1c was over 7.   Review of Systems  Genitourinary: Positive for dysuria and frequency.       Objective:   Physical Exam  Constitutional:       Blood pressure low normal range. Afebrile. No distress          Assessment & Plan:   Urinary tract infection. We'll treat with Cipro 500 twice a day for 7 days Diabetes mellitus. Unclear control fasting blood sugars seem to be stable. She no longer takes mealtime insulin. We'll check a hemoglobin A1c and resume Humalog if not well controlled

## 2010-09-28 LAB — HEMOGLOBIN A1C: Hgb A1c MFr Bld: 7.8 % — ABNORMAL HIGH (ref 4.6–6.5)

## 2010-09-29 NOTE — Progress Notes (Signed)
Quick Note:  Attempt to call - VM - LMTCB to discuss lab results ______

## 2010-09-30 NOTE — Progress Notes (Signed)
Quick Note:  Spoke with pt - informed of lab and the need to restart mealtime insulin. KIK ______

## 2010-10-07 ENCOUNTER — Encounter (INDEPENDENT_AMBULATORY_CARE_PROVIDER_SITE_OTHER): Payer: 59

## 2010-10-14 ENCOUNTER — Encounter (INDEPENDENT_AMBULATORY_CARE_PROVIDER_SITE_OTHER): Payer: Self-pay

## 2010-10-14 ENCOUNTER — Encounter (INDEPENDENT_AMBULATORY_CARE_PROVIDER_SITE_OTHER): Payer: 59

## 2010-10-14 ENCOUNTER — Ambulatory Visit (INDEPENDENT_AMBULATORY_CARE_PROVIDER_SITE_OTHER): Payer: Commercial Managed Care - PPO | Admitting: Physician Assistant

## 2010-10-14 NOTE — Progress Notes (Signed)
  HISTORY: Priscilla [name] is a 61 y.o.female who received an AP-Standard lap-band in December 2010 by Dr. Johna [name].The patient has no new complaints today. She says that she continues to have some issues with regurgitation if she is particularly stressed out while eating. She says this happens up to 10 times a week. Otherwise she has no complaints at all. She says her hunger is under good control and she is eating small portion sizes. She is engaged in a regular exercise program and is doing well with this. She is reluctant to have any adjustment today.  VITAL SIGNS: Filed Vitals:   10/14/10 1447  BP: 118/82    PHYSICAL EXAM: Physical exam reveals a very well-appearing 60 y.o.female in no apparent distress Neurologic: Awake, alert, oriented Psych: Bright affect, conversant Respiratory: Breathing even and unlabored. No stridor or wheezing Extremities: Atraumatic, good range of motion. Skin: Warm, Dry, no rashes Musculoskeletal: Normal gait, Joints normal  ASSESMENT: 60 y.o.  female  s/p AP-Standard lap-band.  PLAN: She may be having some degree of over restriction however we agreed that she will attempt some stress reduction tactics at mealtimes. If this does not work or her symptoms worsen I suggested that she return to the office soon for removal of some fluid. Regardless I like her to return in one month for reevaluation which she understood and agree with.

## 2010-10-14 NOTE — Patient Instructions (Signed)
Consider clear liquids for 1-2 days then slowly advance. Attempt stress reduction at mealtimes. If symptoms do not improve or worsen, contact the office for an appointment as soon as possible. Return in 1 month.

## 2010-11-11 ENCOUNTER — Encounter (INDEPENDENT_AMBULATORY_CARE_PROVIDER_SITE_OTHER): Payer: Self-pay

## 2010-11-11 ENCOUNTER — Ambulatory Visit (INDEPENDENT_AMBULATORY_CARE_PROVIDER_SITE_OTHER): Payer: Commercial Managed Care - PPO | Admitting: Physician Assistant

## 2010-11-11 NOTE — Patient Instructions (Signed)
Return in 3 months or sooner if necessary.

## 2010-11-11 NOTE — Progress Notes (Signed)
  HISTORY: Priscilla [name] is a 60 y.o.female who received an AP-Standard lap-band in December 2010 by Dr. Johna Sheriff. She says she has been doing very well since her last visit. Her GERD symptoms have resolved and she's continuing to lose weight. Hunger is under good control as are her portion sizes.Marland Kitchen  VITAL SIGNS: Filed Vitals:   11/11/10 1456  BP: 122/82    PHYSICAL EXAM: Physical exam reveals a very well-appearing 60 y.o.female in no apparent distress Neurologic: Awake, alert, oriented Psych: Bright affect, conversant Respiratory: Breathing even and unlabored. No stridor or wheezing Extremities: Atraumatic, good range of motion. Skin: Warm, Dry, no rashes Musculoskeletal: Normal gait, Joints normal  ASSESMENT: 61 y.o.  female  s/p AP-Standard lap-band.   PLAN: We opted to not do an adjustment today as her band is giving good hunger control and portion control. We'll see her again in 3 months or sooner if necessary.

## 2010-11-25 ENCOUNTER — Ambulatory Visit (INDEPENDENT_AMBULATORY_CARE_PROVIDER_SITE_OTHER): Payer: Self-pay | Admitting: Internal Medicine

## 2010-11-25 ENCOUNTER — Encounter: Payer: Self-pay | Admitting: Internal Medicine

## 2010-11-25 VITALS — BP 98/60 | Temp 98.9°F | Wt 186.0 lb

## 2010-11-25 DIAGNOSIS — E119 Type 2 diabetes mellitus without complications: Secondary | ICD-10-CM

## 2010-11-25 DIAGNOSIS — R300 Dysuria: Secondary | ICD-10-CM

## 2010-11-25 DIAGNOSIS — Z87448 Personal history of other diseases of urinary system: Secondary | ICD-10-CM

## 2010-11-25 LAB — POCT URINALYSIS DIPSTICK
Glucose, UA: NEGATIVE
Nitrite, UA: POSITIVE
Urobilinogen, UA: 0.2
pH, UA: 7

## 2010-11-25 MED ORDER — CIPROFLOXACIN HCL 500 MG PO TABS: 500.0000 mg | ORAL_TABLET | Freq: Two times a day (BID) | ORAL | Status: DC

## 2010-11-25 NOTE — Progress Notes (Signed)
  Subjective:    Patient ID: Priscilla [name], female    DOB: 1950-12-04, 60 y.o.   MRN: [id number]  HPI  60 year old patient who has multiple medical problems. She does have a history of prior UTI and was treated with Cipro earlier this year. She responded well. For the past 2 days she's had burning dysuria and frequency. No fever chills or flank pain.    Review of Systems  Constitutional: Negative for fever, chills and diaphoresis.  Genitourinary: Positive for dysuria, urgency and frequency. Negative for flank pain.       Objective:   Physical Exam  Constitutional: She appears well-developed and well-nourished. No distress.       T temperature 98.8          Assessment & Plan:    Cystitis. Will treat with Cipro for 7 days. She is asked to force fluids. She will return for her routine followup as scheduled

## 2010-11-25 NOTE — Patient Instructions (Signed)
Take your antibiotic as prescribed until ALL of it is gone, but stop if you develop a rash, swelling, or any side effects of the medication.  Contact our office as soon as possible if  there are side effects of the medication.  Drink as much fluid as you  can tolerate over the next few days  Call or return to clinic prn if these symptoms worsen or fail to improve as anticipated.  

## 2010-12-28 LAB — POCT URINALYSIS DIP (DEVICE)
Nitrite: NEGATIVE
Protein, ur: 30 — AB
Urobilinogen, UA: 1
pH: 6

## 2011-01-10 LAB — GLUCOSE, CAPILLARY
Glucose-Capillary: 153 — ABNORMAL HIGH
Glucose-Capillary: 210 — ABNORMAL HIGH
Glucose-Capillary: 219 — ABNORMAL HIGH
Glucose-Capillary: 337 — ABNORMAL HIGH

## 2011-01-10 LAB — POCT I-STAT, CHEM 8
Creatinine, Ser: 0.7
Glucose, Bld: 142 — ABNORMAL HIGH
HCT: 37

## 2011-01-16 LAB — POCT URINALYSIS DIP (DEVICE)
Bilirubin Urine: NEGATIVE
Nitrite: NEGATIVE
Protein, ur: NEGATIVE
Urobilinogen, UA: 0.2
pH: 7.5

## 2011-01-29 ENCOUNTER — Other Ambulatory Visit: Payer: Self-pay | Admitting: Internal Medicine

## 2011-02-14 ENCOUNTER — Other Ambulatory Visit: Payer: Self-pay | Admitting: Internal Medicine

## 2011-02-17 ENCOUNTER — Other Ambulatory Visit: Payer: Self-pay | Admitting: Internal Medicine

## 2011-03-21 ENCOUNTER — Ambulatory Visit (INDEPENDENT_AMBULATORY_CARE_PROVIDER_SITE_OTHER): Payer: Commercial Managed Care - PPO | Admitting: Family Medicine

## 2011-03-21 ENCOUNTER — Encounter: Payer: Self-pay | Admitting: Family Medicine

## 2011-03-21 VITALS — BP 108/80 | HR 118 | Temp 98.3°F | Wt 183.0 lb

## 2011-03-21 DIAGNOSIS — J069 Acute upper respiratory infection, unspecified: Secondary | ICD-10-CM

## 2011-03-21 MED ORDER — CETIRIZINE HCL 10 MG PO TABS: 10.0000 mg | ORAL_TABLET | Freq: Every day | ORAL | Status: DC

## 2011-03-21 NOTE — Progress Notes (Signed)
  Subjective:     Priscilla [name] is a 60 y.o. female who presents for evaluation of symptoms of a URI. Symptoms include coryza and fever 100. Onset of symptoms was 1 week ago, and has been stable since that time. Treatment to date: none.  The following portions of the patient's history were reviewed and updated as appropriate: allergies, current medications, past family history, past medical history, past social history, past surgical history and problem list.  Review of Systems Pertinent items are noted in HPI.   Objective:   A&O, NAD, HEENT: Ears clear, nasal turbinates are normal, Pharynx mildly red, Neck: no LA Lungs: Lungs clear, Cardiac: RRR, MRG Assessment:   Viral URI   Plan:    Discussed diagnosis and treatment of URI. Suggested symptomatic OTC remedies. Nasal saline spray for congestion. Follow up as needed.

## 2011-03-21 NOTE — Patient Instructions (Signed)

## 2011-03-24 ENCOUNTER — Ambulatory Visit (INDEPENDENT_AMBULATORY_CARE_PROVIDER_SITE_OTHER): Payer: Commercial Managed Care - PPO | Admitting: Physician Assistant

## 2011-03-24 ENCOUNTER — Encounter (INDEPENDENT_AMBULATORY_CARE_PROVIDER_SITE_OTHER): Payer: Self-pay

## 2011-03-24 VITALS — BP 112/74 | HR 60 | Temp 96.9°F | Resp 16 | Ht 64.5 in | Wt 181.2 lb

## 2011-03-24 DIAGNOSIS — Z9884 Bariatric surgery status: Secondary | ICD-10-CM

## 2011-03-24 DIAGNOSIS — E669 Obesity, unspecified: Secondary | ICD-10-CM

## 2011-03-24 NOTE — Progress Notes (Signed)
  HISTORY: Priscilla [name] is a 60 y.o.female who received an AP-Standard lap-band in December 2010 by Dr. Johna Sheriff. She has no new complaints today. She has had less exercise secondary to work schedule and early darkness outside (she usually walks). No persistent vomiting or regurgitation.  VITAL SIGNS: Filed Vitals:   03/24/11 0904  BP: 112/74  Pulse: 60  Temp: 96.9 F (36.1 C)  Resp: 16    PHYSICAL EXAM: Physical exam reveals a very well-appearing 60 y.o.female in no apparent distress Neurologic: Awake, alert, oriented Psych: Bright affect, conversant Respiratory: Breathing even and unlabored. No stridor or wheezing Extremities: Atraumatic, good range of motion. Skin: Warm, Dry, no rashes Musculoskeletal: Normal gait, Joints normal  ASSESMENT: 60 y.o.  female  s/p AP-Standard lap-band.   PLAN: We deferred an adjustment today as her band seems to be in the green zone. We'll have her back in 2 months or sooner if needed.

## 2011-03-24 NOTE — Patient Instructions (Signed)
Return in 2 months or sooner if needed. Return if you have increasing portion sizes or appetite or difficulty swallowing.

## 2011-04-21 ENCOUNTER — Encounter: Payer: Self-pay | Admitting: Internal Medicine

## 2011-04-21 ENCOUNTER — Ambulatory Visit (INDEPENDENT_AMBULATORY_CARE_PROVIDER_SITE_OTHER): Payer: 59 | Admitting: Internal Medicine

## 2011-04-21 DIAGNOSIS — E119 Type 2 diabetes mellitus without complications: Secondary | ICD-10-CM

## 2011-04-21 DIAGNOSIS — E785 Hyperlipidemia, unspecified: Secondary | ICD-10-CM

## 2011-04-21 DIAGNOSIS — M1990 Unspecified osteoarthritis, unspecified site: Secondary | ICD-10-CM

## 2011-04-21 DIAGNOSIS — I10 Essential (primary) hypertension: Secondary | ICD-10-CM

## 2011-04-21 NOTE — Patient Instructions (Signed)
Please check your hemoglobin A1c every 3 months    It is important that you exercise regularly, at least 20 minutes 3 to 4 times per week.  If you develop chest pain or shortness of breath seek  medical attention.   

## 2011-04-21 NOTE — Progress Notes (Signed)
  Subjective:    Patient ID: Priscilla [name], female    DOB: [birth year], 61 y.o.   MRN: [id number]  HPI  61 year old patient who is in today for followup. She was seen one month ago with URI and still has some residual fatigue. She feels that she probably has a has some significant improvement. She has a history of exogenous obesity and is approximately 2 years out from lap  band surgery.  She is on Levemir 50 units at bedtime but still does not take her mealtime insulin. Hemoglobin A1c's have been consistently greater than 7 and have not been checked since June. She has a history of osteoarthritis and fibromyalgia she also has a history of dyslipidemia. She has a history of hypertension with palpitations which have been well-controlled on beta blocker therapy    Review of Systems  Constitutional: Positive for fatigue.  HENT: Positive for congestion. Negative for hearing loss, sore throat, rhinorrhea, dental problem, sinus pressure and tinnitus.   Eyes: Negative for pain, discharge and visual disturbance.  Respiratory: Negative for cough and shortness of breath.   Cardiovascular: Negative for chest pain, palpitations and leg swelling.  Gastrointestinal: Negative for nausea, vomiting, abdominal pain, diarrhea, constipation, blood in stool and abdominal distention.  Genitourinary: Negative for dysuria, urgency, frequency, hematuria, flank pain, vaginal bleeding, vaginal discharge, difficulty urinating, vaginal pain and pelvic pain.  Musculoskeletal: Negative for joint swelling, arthralgias and gait problem.  Skin: Negative for rash.  Neurological: Negative for dizziness, syncope, speech difficulty, weakness, numbness and headaches.  Hematological: Negative for adenopathy.  Psychiatric/Behavioral: Negative for behavioral problems, dysphoric mood and agitation. The patient is not nervous/anxious.        Objective:   Physical Exam  Constitutional: She is oriented to person, place, and time. She  appears well-developed and well-nourished.  HENT:  Head: Normocephalic.  Right Ear: External ear normal.  Left Ear: External ear normal.  Mouth/Throat: Oropharynx is clear and moist.  Eyes: Conjunctivae and EOM are normal. Pupils are equal, round, and reactive to light.  Neck: Normal range of motion. Neck supple. No thyromegaly present.  Cardiovascular: Normal rate, regular rhythm, normal heart sounds and intact distal pulses.   Pulmonary/Chest: Effort normal and breath sounds normal.  Abdominal: Soft. Bowel sounds are normal. She exhibits no mass. There is no tenderness.  Musculoskeletal: Normal range of motion.  Lymphadenopathy:    She has no cervical adenopathy.  Neurological: She is alert and oriented to person, place, and time.  Skin: Skin is warm and dry. No rash noted.  Psychiatric: She has a normal mood and affect. Her behavior is normal.          Assessment & Plan:   Diabetes. We'll check a hemoglobin A1c. We'll resume mealtime insulin Hypertension stable URI improved Dyslipidemia Exogenous obesity exercise weight loss encouraged  Recheck 3 months

## 2011-04-24 NOTE — Progress Notes (Signed)
Quick Note:  Attempt to call= VM - lmtcb if questions - lab results given with instructions ______

## 2011-05-05 ENCOUNTER — Encounter (INDEPENDENT_AMBULATORY_CARE_PROVIDER_SITE_OTHER): Payer: Commercial Managed Care - PPO

## 2011-05-30 ENCOUNTER — Other Ambulatory Visit: Payer: Self-pay | Admitting: Internal Medicine

## 2011-06-27 ENCOUNTER — Other Ambulatory Visit: Payer: Self-pay | Admitting: Internal Medicine

## 2011-07-11 ENCOUNTER — Telehealth (INDEPENDENT_AMBULATORY_CARE_PROVIDER_SITE_OTHER): Payer: 59 | Admitting: Internal Medicine

## 2011-07-11 ENCOUNTER — Telehealth: Payer: Self-pay

## 2011-07-11 ENCOUNTER — Other Ambulatory Visit: Payer: 59

## 2011-07-11 DIAGNOSIS — R300 Dysuria: Secondary | ICD-10-CM

## 2011-07-11 LAB — POCT URINALYSIS DIPSTICK
Blood, UA: NEGATIVE
Ketones, UA: 1
Protein, UA: NEGATIVE
Spec Grav, UA: 1.02

## 2011-07-11 MED ORDER — CIPROFLOXACIN HCL 500 MG PO TABS: 500.0000 mg | ORAL_TABLET | Freq: Two times a day (BID) | ORAL | Status: AC

## 2011-07-11 NOTE — Telephone Encounter (Signed)
Pt has history of bladder infection requesting to come in and provide urine samples. Pt is sch for today

## 2011-07-11 NOTE — Telephone Encounter (Signed)
cipro sent to [facility] per dr. Amador Cunas for uti

## 2011-07-11 NOTE — Telephone Encounter (Signed)
Order place for UA

## 2011-07-25 ENCOUNTER — Telehealth: Payer: Self-pay | Admitting: Internal Medicine

## 2011-07-25 NOTE — Telephone Encounter (Signed)
Please call and schedule appt to be seen for dr. Amador Cunas to eval since was on cipro and now has blood in urine - 430 today or tomorrow 8am double book - or another provider

## 2011-07-25 NOTE — Telephone Encounter (Signed)
LMTCB for appt today or tomorrow.  Please see previous note.

## 2011-07-25 NOTE — Telephone Encounter (Signed)
Pt called and is still having symptoms of uti and well as blood in urine. Pt was prescribed cipro, but feels that med is not strong enough. Pls call in stronger med to [facility] [place].

## 2011-07-25 NOTE — Telephone Encounter (Signed)
Appt was made with [facility] tomorrow per chart.

## 2011-07-26 ENCOUNTER — Ambulatory Visit (INDEPENDENT_AMBULATORY_CARE_PROVIDER_SITE_OTHER): Payer: 59 | Admitting: Family

## 2011-07-26 ENCOUNTER — Encounter: Payer: Self-pay | Admitting: Family

## 2011-07-26 DIAGNOSIS — Z87448 Personal history of other diseases of urinary system: Secondary | ICD-10-CM

## 2011-07-26 DIAGNOSIS — R319 Hematuria, unspecified: Secondary | ICD-10-CM

## 2011-07-26 LAB — POCT URINALYSIS DIPSTICK
Bilirubin, UA: NEGATIVE
Leukocytes, UA: NEGATIVE
Spec Grav, UA: 1.02
pH, UA: 6.5

## 2011-07-26 NOTE — Progress Notes (Signed)
Subjective:    Patient ID: Priscilla [name], female    DOB: [birth year], 61 y.o.   MRN: [id number]  HPI Comments: 61 yo white female RTC with continued c/o urine frequency, urgency, and dysuria better after completing cipro 4 days ago. Noticed hematuria today.  "Symptoms are less in severity after completing cipro." Denies nausea, vomiting, or abdominal pain.   Urinary Tract Infection  Associated symptoms include frequency, hematuria and urgency. Pertinent negatives include no flank pain.      Review of Systems  Constitutional: Negative.   Respiratory: Negative.   Cardiovascular: Negative.   Gastrointestinal: Negative.   Genitourinary: Positive for dysuria, urgency, frequency and hematuria. Negative for flank pain, decreased urine volume, vaginal discharge, enuresis, difficulty urinating, menstrual problem, pelvic pain and dyspareunia.   Past Medical History  Diagnosis Date  . ANEMIA, HX OF 12/14/2008  . ANXIETY DISORDER, HX OF 12/14/2008  . ASTHMA 12/14/2008  . DEPRESSION 01/21/2010  . DIABETES MELLITUS, TYPE II 11/12/2006  . DYSPNEA 12/14/2008  . EDEMA, ANKLES 12/14/2008  . FATIGUE 01/04/2007  . FIBROMYALGIA 08/06/2008  . HX OF GALLSTONE 12/14/2008  . HYPERLIPIDEMIA 11/12/2006  . HYPERTENSION 11/12/2006  . OSTEOARTHRITIS 12/05/2006  . PALPITATIONS, HX OF 12/14/2008  . 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 PERIPHERAL NEUROPATHY 08/06/2008  . SINUS TACHYCARDIA,HX OF 12/14/2008  . SLEEP APNEA, OBSTRUCTIVE, MODERATE 08/02/2007  . UTI'S, HX OF 12/14/2008    History   Social History  . Marital Status: Married    Spouse Name: N/A    Number of Children: N/A  . Years of Education: N/A   Occupational History  . Not on file.   Social History Main Topics  . Smoking status: Never Smoker   . Smokeless tobacco: Never Used  . Alcohol Use: Yes     glass of wine-specially occasion  . Drug Use: No  . Sexually Active: Not on file   Other Topics Concern  . Not on file    Social History Narrative  . No narrative on file    Past Surgical History  Procedure Date  . Cholecystectomy   . Abdominal hysterectomy   . Laparoscopic gastric banding   . Wrist surgery     LEFT  . Ptca     Family History  Problem Relation Age of Onset  . Other Mother     ALS  . Stroke Father   . Diabetes Sister     No Known Allergies  Current Outpatient Prescriptions on File Prior to Visit  Medication Sig Dispense Refill  . ALPRAZolam (XANAX) 0.25 MG tablet take 1 tablet by mouth at bedtime  60 tablet  2  . aspirin 81 MG tablet Take 81 mg by mouth daily.        . B-D INS SYR ULTRAFINE 1CC/31G 31G X 5/16" 1 ML MISC use as directed daily  100 each  1  . calcium carbonate (OS-CAL) 600 MG TABS Take 600 mg by mouth daily.        . cetirizine (ZYRTEC) 10 MG tablet Take 1 tablet (10 mg total) by mouth daily.  30 tablet  1  . cyclobenzaprine (FLEXERIL) 10 MG tablet Take 10 mg by mouth at bedtime.        . CYMBALTA 60 MG capsule take 1 capsule by mouth once daily  90 capsule  2  . estradiol (ESTRACE) 1 MG tablet Take 1 mg by mouth daily.        Marland Kitchen FREESTYLE LITE test strip TEST  TWICE A DAY  100 each  5  . insulin detemir (LEVEMIR) 100 UNIT/ML injection Inject 50 Units into the skin at bedtime.        . Insulin Syringe-Needle U-100 (B-D INS SYR MICROFINE 1CC/27G) 27G X 5/8" 1 ML MISC Use 4 times daily prior to meals and at bedtime  120 each  6  . Lancet Device MISC by Does not apply route daily.        Marland Kitchen lisinopril-hydrochlorothiazide (PRINZIDE,ZESTORETIC) 20-12.5 MG per tablet take 1 tablet by mouth once daily  90 tablet  3  . metFORMIN (GLUCOPHAGE) 500 MG tablet take 1 tablet by mouth twice a day with food  180 tablet  3  . metoprolol tartrate (LOPRESSOR) 25 MG tablet Take 1 tablet (25 mg total) by mouth 2 (two) times daily.  60 tablet  11  . Multiple Vitamin (MULTIVITAMIN) capsule Take 1 capsule by mouth daily.        . nitroGLYCERIN (NITROSTAT) 0.4 MG SL tablet Place 1 tablet  (0.4 mg total) under the tongue every 5 (five) minutes as needed for chest pain.  90 tablet  12  . PROAIR HFA 108 (90 BASE) MCG/ACT inhaler inhale 2 puffs by mouth every 6 hours if needed  8.5 g  6  . insulin aspart (NOVOLOG) 100 UNIT/ML injection BS< 70-no insulin; 71<BS<150-8 units; 151<BS<200- 12 units; BS> 200 16 units  10 mL  3    There were no vitals taken for this visit.chart     Objective:   Physical Exam  Constitutional: She is oriented to person, place, and time. She appears well-developed and well-nourished. No distress.  Cardiovascular: Normal rate, regular rhythm, normal heart sounds and intact distal pulses.  Exam reveals no gallop and no friction rub.   No murmur heard. Pulmonary/Chest: Effort normal and breath sounds normal. No respiratory distress. She has no wheezes. She has no rales. She exhibits no tenderness.  Neurological: She is alert and oriented to person, place, and time.  Skin: Skin is warm and dry. She is not diaphoretic.          Assessment & Plan:  Assessment: Hematuria  Plan: Urine dip and C & S. Force po fluids (water). If s/s continue will consult with urology. Teacing handout provided on s/s and treatment

## 2011-07-28 LAB — URINE CULTURE

## 2011-07-31 ENCOUNTER — Telehealth: Payer: Self-pay

## 2011-07-31 MED ORDER — SULFAMETHOXAZOLE-TRIMETHOPRIM 800-160 MG PO TABS: 1.0000 | ORAL_TABLET | Freq: Two times a day (BID) | ORAL | Status: AC

## 2011-07-31 NOTE — Telephone Encounter (Signed)
Message copied by Beverely Low on Mon Jul 31, 2011  8:59 AM ------      Message from: Adline Mango B      Created: Mon Jul 31, 2011  8:39 AM       Positive UTI. Start Bactrim DS BID x 7 days

## 2011-07-31 NOTE — Telephone Encounter (Signed)
Pt aware and Rx sent to pharmacy  

## 2011-08-05 ENCOUNTER — Other Ambulatory Visit: Payer: Self-pay | Admitting: Internal Medicine

## 2011-08-19 ENCOUNTER — Other Ambulatory Visit: Payer: Self-pay | Admitting: Internal Medicine

## 2011-08-25 ENCOUNTER — Other Ambulatory Visit: Payer: Self-pay | Admitting: Internal Medicine

## 2011-08-25 DIAGNOSIS — Z1231 Encounter for screening mammogram for malignant neoplasm of breast: Secondary | ICD-10-CM

## 2011-09-11 ENCOUNTER — Telehealth: Payer: Self-pay | Admitting: Internal Medicine

## 2011-09-11 ENCOUNTER — Other Ambulatory Visit (INDEPENDENT_AMBULATORY_CARE_PROVIDER_SITE_OTHER): Payer: 59

## 2011-09-11 ENCOUNTER — Other Ambulatory Visit: Payer: Self-pay | Admitting: Internal Medicine

## 2011-09-11 DIAGNOSIS — N390 Urinary tract infection, site not specified: Secondary | ICD-10-CM

## 2011-09-11 LAB — POCT URINALYSIS DIPSTICK
Nitrite, UA: POSITIVE
Protein, UA: NEGATIVE
pH, UA: 5.5

## 2011-09-11 MED ORDER — CEPHALEXIN 500 MG PO CAPS: 500.0000 mg | ORAL_CAPSULE | Freq: Two times a day (BID) | ORAL | Status: AC

## 2011-09-11 NOTE — Telephone Encounter (Signed)
Pt called and lft a vm to confirm her lab appt for today and said that she would wait around after labs have been done to see if she needs an ov with pcp or not.

## 2011-09-11 NOTE — Telephone Encounter (Signed)
Called and left pt detailed message that she has been sch for ua today at 4pm and to stay after labs are done, just in case pcp needs to see pt.

## 2011-09-11 NOTE — Progress Notes (Signed)
Quick Note:  Called in - pt aware ______

## 2011-09-11 NOTE — Telephone Encounter (Signed)
Please schedule her inlab for UA - I will put order in - ask her to stay until done to make sure dr. Amador Cunas doesn't need to see her.

## 2011-09-11 NOTE — Telephone Encounter (Signed)
Pt has another kidney inf and is req to get a ua done this afternoon after 4pm if possilble. If not today tomorrow. Pls advise.

## 2011-10-27 ENCOUNTER — Ambulatory Visit: Payer: 59

## 2011-11-01 ENCOUNTER — Encounter: Payer: Self-pay | Admitting: Family Medicine

## 2011-11-01 ENCOUNTER — Ambulatory Visit (INDEPENDENT_AMBULATORY_CARE_PROVIDER_SITE_OTHER): Payer: 59 | Admitting: Family Medicine

## 2011-11-01 VITALS — BP 112/78 | HR 91 | Temp 98.5°F | Wt 180.0 lb

## 2011-11-01 DIAGNOSIS — N390 Urinary tract infection, site not specified: Secondary | ICD-10-CM

## 2011-11-01 DIAGNOSIS — R350 Frequency of micturition: Secondary | ICD-10-CM

## 2011-11-01 LAB — POCT URINALYSIS DIPSTICK
Nitrite, UA: NEGATIVE
Urobilinogen, UA: 0.2
pH, UA: 6

## 2011-11-01 MED ORDER — SULFAMETHOXAZOLE-TRIMETHOPRIM 800-160 MG PO TABS: 1.0000 | ORAL_TABLET | Freq: Two times a day (BID) | ORAL | Status: AC

## 2011-11-01 NOTE — Progress Notes (Signed)
  Subjective:    Patient ID: Priscilla [name], female    DOB: 06/13/1950, 61 y.o.   MRN: [id number]  HPI Here for another UTI. She was treated in April for an E coli UTI that was resistant to Cipro but sensitive to Bactrim DS. Now for 2 days she again has urinary frequency and burning. No fever. Drinking plenty of water.    Review of Systems  Constitutional: Negative.   Gastrointestinal: Negative.   Genitourinary: Positive for dysuria, urgency and frequency. Negative for hematuria, flank pain and pelvic pain.       Objective:   Physical Exam  Constitutional: She appears well-developed and well-nourished.  Abdominal: Soft. Bowel sounds are normal. She exhibits no distension and no mass. There is no tenderness. There is no rebound and no guarding.          Assessment & Plan:  Treat with Bactrim DS. Use Azo prn. Await the culture results

## 2011-11-02 ENCOUNTER — Telehealth: Payer: Self-pay | Admitting: Internal Medicine

## 2011-11-02 NOTE — Telephone Encounter (Signed)
Caller: Glenisha/Patient; PCP: Beverely Low; CB#: [phone]; Call regarding Vomiting/Nausea; Put On Antibiotic (Bactrim) for Kidney Infection; Seen by Dr. Clent Ridges on 11/01/11 for UTI, now has nausea and vomiting.  FSBG 100. Afebrile/subjective.  Information noted and sent to office for follow up per Nausea or Vomiting protocol d/t unable to keep down Rx meds d/t N/V. Caller requests suppository instead of tablets.   Pharmacy preference verified in Epic.

## 2011-11-02 NOTE — Telephone Encounter (Signed)
Please advise - you saw yesterday

## 2011-11-03 MED ORDER — PROMETHAZINE HCL 25 MG RE SUPP: 25.0000 mg | RECTAL | Status: DC | PRN

## 2011-11-03 NOTE — Telephone Encounter (Signed)
Call in Phenergan 25 mg suppositories to use one per rectum every 4 hours prn nausea, #30 with no rf

## 2011-11-03 NOTE — Telephone Encounter (Signed)
I called in script and spoke with pt. 

## 2011-11-06 NOTE — Progress Notes (Signed)
Quick Note:  Left voice message with results. ______ 

## 2011-11-10 ENCOUNTER — Ambulatory Visit: Payer: 59

## 2011-11-16 ENCOUNTER — Other Ambulatory Visit: Payer: Self-pay | Admitting: Internal Medicine

## 2011-12-18 ENCOUNTER — Other Ambulatory Visit: Payer: Self-pay | Admitting: Internal Medicine

## 2011-12-18 ENCOUNTER — Encounter: Payer: Self-pay | Admitting: Internal Medicine

## 2011-12-18 ENCOUNTER — Ambulatory Visit (INDEPENDENT_AMBULATORY_CARE_PROVIDER_SITE_OTHER): Payer: 59 | Admitting: Internal Medicine

## 2011-12-18 VITALS — BP 100/70 | Temp 98.0°F | Wt 178.0 lb

## 2011-12-18 DIAGNOSIS — Z87448 Personal history of other diseases of urinary system: Secondary | ICD-10-CM

## 2011-12-18 DIAGNOSIS — R300 Dysuria: Secondary | ICD-10-CM

## 2011-12-18 DIAGNOSIS — I10 Essential (primary) hypertension: Secondary | ICD-10-CM

## 2011-12-18 DIAGNOSIS — E119 Type 2 diabetes mellitus without complications: Secondary | ICD-10-CM

## 2011-12-18 LAB — POCT URINALYSIS DIPSTICK
Bilirubin, UA: NEGATIVE
Blood, UA: NEGATIVE
Glucose, UA: NEGATIVE
Ketones, UA: NEGATIVE
Nitrite, UA: NEGATIVE
Spec Grav, UA: 1.025

## 2011-12-18 MED ORDER — TRIMETHOPRIM 100 MG PO TABS: 100.0000 mg | ORAL_TABLET | Freq: Two times a day (BID) | ORAL | Status: AC

## 2011-12-18 MED ORDER — INSULIN DETEMIR 100 UNIT/ML ~~LOC~~ SOLN: SUBCUTANEOUS | Status: DC

## 2011-12-18 MED ORDER — INSULIN ASPART 100 UNIT/ML ~~LOC~~ SOLN: SUBCUTANEOUS | Status: DC

## 2011-12-18 MED ORDER — METFORMIN HCL 1000 MG PO TABS: 1000.0000 mg | ORAL_TABLET | Freq: Two times a day (BID) | ORAL | Status: DC

## 2011-12-18 NOTE — Progress Notes (Signed)
Subjective:    Patient ID: Priscilla [name], female    DOB: 09-Feb-1951, 61 y.o.   MRN: [id number]  HPI  61 year old patient who presents with burning dysuria frequency and urgency. She has had at least 2 UTIs this year confirmed by urine culture. She has a history of type 2 diabetes but has not been using mealtime insulin. She is on Levemir 40 units at bedtime with fasting blood sugars range from 90-100. She had been on NovoLog prior to meals. No recent hemoglobin A1c. She has treated hypertension which has been well-controlled.  Past Medical History  Diagnosis Date  . ANEMIA, HX OF 12/14/2008  . ANXIETY DISORDER, HX OF 12/14/2008  . ASTHMA 12/14/2008  . DEPRESSION 01/21/2010  . DIABETES MELLITUS, TYPE II 11/12/2006  . DYSPNEA 12/14/2008  . EDEMA, ANKLES 12/14/2008  . FATIGUE 01/04/2007  . FIBROMYALGIA 08/06/2008  . HX OF GALLSTONE 12/14/2008  . HYPERLIPIDEMIA 11/12/2006  . HYPERTENSION 11/12/2006  . OSTEOARTHRITIS 12/05/2006  . PALPITATIONS, HX OF 12/14/2008  . 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 PERIPHERAL NEUROPATHY 08/06/2008  . SINUS TACHYCARDIA,HX OF 12/14/2008  . SLEEP APNEA, OBSTRUCTIVE, MODERATE 08/02/2007  . UTI'S, HX OF 12/14/2008    History   Social History  . Marital Status: Married    Spouse Name: N/A    Number of Children: N/A  . Years of Education: N/A   Occupational History  . Not on file.   Social History Main Topics  . Smoking status: Never Smoker   . Smokeless tobacco: Never Used  . Alcohol Use: Yes     glass of wine-specially occasion  . Drug Use: No  . Sexually Active: Not on file   Other Topics Concern  . Not on file   Social History Narrative  . No narrative on file    Past Surgical History  Procedure Date  . Cholecystectomy   . Abdominal hysterectomy   . Laparoscopic gastric banding   . Wrist surgery     LEFT  . Ptca     Family History  Problem Relation Age of Onset  . Other Mother     ALS  . Stroke Father   . Diabetes  Sister     No Known Allergies  Current Outpatient Prescriptions on File Prior to Visit  Medication Sig Dispense Refill  . ALPRAZolam (XANAX) 0.25 MG tablet take 1 tablet by mouth at bedtime  60 tablet  1  . aspirin 81 MG tablet Take 81 mg by mouth daily.        . B-D INS SYR ULTRAFINE 1CC/31G 31G X 5/16" 1 ML MISC use as directed daily  100 each  1  . calcium carbonate (OS-CAL) 600 MG TABS Take 600 mg by mouth daily.        . cetirizine (ZYRTEC) 10 MG tablet Take 1 tablet (10 mg total) by mouth daily.  30 tablet  1  . cyclobenzaprine (FLEXERIL) 10 MG tablet Take 10 mg by mouth at bedtime.        . CYMBALTA 60 MG capsule take 1 capsule by mouth once daily  90 capsule  1  . estradiol (ESTRACE) 1 MG tablet Take 1 mg by mouth daily.        Marland Kitchen FREESTYLE LITE test strip TEST TWICE A DAY  100 each  5  . insulin detemir (LEVEMIR) 100 UNIT/ML injection Inject 50 Units into the skin at bedtime.        . Insulin Syringe-Needle U-100 (B-D  INS SYR MICROFINE 1CC/27G) 27G X 5/8" 1 ML MISC Use 4 times daily prior to meals and at bedtime  120 each  6  . Lancet Device MISC by Does not apply route daily.        Marland Kitchen lisinopril-hydrochlorothiazide (PRINZIDE,ZESTORETIC) 20-12.5 MG per tablet take 1 tablet by mouth once daily  90 tablet  3  . metFORMIN (GLUCOPHAGE) 500 MG tablet take 1 tablet by mouth twice a day with food  180 tablet  3  . metoprolol tartrate (LOPRESSOR) 25 MG tablet take 1 tablet by mouth twice a day  180 tablet  3  . Multiple Vitamin (MULTIVITAMIN) capsule Take 1 capsule by mouth daily.        Marland Kitchen PROAIR HFA 108 (90 BASE) MCG/ACT inhaler inhale 2 puffs by mouth every 6 hours if needed  8.5 g  6  . DISCONTD: insulin aspart (NOVOLOG) 100 UNIT/ML injection BS< 70-no insulin; 71<BS<150-8 units; 151<BS<200- 12 units; BS> 200 16 units  10 mL  3  . DISCONTD: nitroGLYCERIN (NITROSTAT) 0.4 MG SL tablet Place 1 tablet (0.4 mg total) under the tongue every 5 (five) minutes as needed for chest pain.  90 tablet   12  . DISCONTD: promethazine (PHENERGAN) 25 MG suppository Place 1 suppository (25 mg total) rectally every 4 (four) hours as needed for nausea.  30 each  0    BP 100/70  Temp 98 F (36.7 C) (Oral)  Wt 178 lb (80.74 kg)       Review of Systems  Constitutional: Negative.   HENT: Negative for hearing loss, congestion, sore throat, rhinorrhea, dental problem, sinus pressure and tinnitus.   Eyes: Negative for pain, discharge and visual disturbance.  Respiratory: Negative for cough and shortness of breath.   Cardiovascular: Negative for chest pain, palpitations and leg swelling.  Gastrointestinal: Negative for nausea, vomiting, abdominal pain, diarrhea, constipation, blood in stool and abdominal distention.  Genitourinary: Positive for dysuria, urgency, frequency and decreased urine volume. Negative for hematuria, flank pain, vaginal bleeding, vaginal discharge, difficulty urinating, vaginal pain and pelvic pain.  Musculoskeletal: Negative for joint swelling, arthralgias and gait problem.  Skin: Negative for rash.  Neurological: Negative for dizziness, syncope, speech difficulty, weakness, numbness and headaches.  Hematological: Negative for adenopathy.  Psychiatric/Behavioral: Negative for behavioral problems, dysphoric mood and agitation. The patient is not nervous/anxious.        Objective:   Physical Exam  Constitutional: She is oriented to person, place, and time. She appears well-developed and well-nourished.       Blood pressure 100/60  HENT:  Head: Normocephalic.  Right Ear: External ear normal.  Left Ear: External ear normal.  Mouth/Throat: Oropharynx is clear and moist.  Eyes: Conjunctivae and EOM are normal. Pupils are equal, round, and reactive to light.  Neck: Normal range of motion. Neck supple. No thyromegaly present.  Cardiovascular: Normal rate, regular rhythm, normal heart sounds and intact distal pulses.   Pulmonary/Chest: Effort normal and breath sounds  normal.  Abdominal: Soft. Bowel sounds are normal. She exhibits no mass. There is no tenderness.  Musculoskeletal: Normal range of motion.  Lymphadenopathy:    She has no cervical adenopathy.  Neurological: She is alert and oriented to person, place, and time.  Skin: Skin is warm and dry. No rash noted.  Psychiatric: She has a normal mood and affect. Her behavior is normal.          Assessment & Plan:   UTI. Will treat with Trimpex for 7 days Diabetes mellitus.  Will increase metformin to 1000 mg twice daily we'll continue Levemir to present dose of 40 units at bedtime. Will resume NovoLog sliding  scale insulin Hypertension well controlled  Recheck 3 months

## 2011-12-18 NOTE — Patient Instructions (Signed)
Please check your hemoglobin A1c every 3 months  Take your antibiotic as prescribed until ALL of it is gone, but stop if you develop a rash, swelling, or any side effects of the medication.  Contact our office as soon as possible if  there are side effects of the medication. 

## 2011-12-29 ENCOUNTER — Other Ambulatory Visit: Payer: Self-pay | Admitting: Internal Medicine

## 2012-01-25 ENCOUNTER — Other Ambulatory Visit: Payer: Self-pay | Admitting: Internal Medicine

## 2012-03-01 ENCOUNTER — Other Ambulatory Visit: Payer: Self-pay | Admitting: Internal Medicine

## 2012-03-15 ENCOUNTER — Ambulatory Visit
Admission: RE | Admit: 2012-03-15 | Discharge: 2012-03-15 | Disposition: A | Discharge status: Home / Self Care | Payer: 59 | Source: Ambulatory Visit | Attending: Internal Medicine | Admitting: Internal Medicine

## 2012-03-15 DIAGNOSIS — Z1231 Encounter for screening mammogram for malignant neoplasm of breast: Secondary | ICD-10-CM

## 2012-03-20 ENCOUNTER — Other Ambulatory Visit: Payer: Self-pay | Admitting: Internal Medicine

## 2012-03-20 DIAGNOSIS — R928 Other abnormal and inconclusive findings on diagnostic imaging of breast: Secondary | ICD-10-CM

## 2012-03-29 ENCOUNTER — Ambulatory Visit
Admission: RE | Admit: 2012-03-29 | Discharge: 2012-03-29 | Disposition: A | Discharge status: Home / Self Care | Payer: 59 | Source: Ambulatory Visit | Attending: Internal Medicine | Admitting: Internal Medicine

## 2012-03-29 DIAGNOSIS — R928 Other abnormal and inconclusive findings on diagnostic imaging of breast: Secondary | ICD-10-CM

## 2012-04-12 ENCOUNTER — Other Ambulatory Visit: Payer: 59

## 2012-05-21 ENCOUNTER — Other Ambulatory Visit: Payer: Self-pay | Admitting: Internal Medicine

## 2012-06-21 ENCOUNTER — Other Ambulatory Visit: Payer: Self-pay | Admitting: Orthopaedic Surgery

## 2012-06-26 ENCOUNTER — Ambulatory Visit
Admission: RE | Admit: 2012-06-26 | Discharge: 2012-06-26 | Disposition: A | Discharge status: Home / Self Care | Payer: 59 | Source: Ambulatory Visit | Attending: Orthopaedic Surgery | Admitting: Orthopaedic Surgery

## 2012-06-28 ENCOUNTER — Encounter (INDEPENDENT_AMBULATORY_CARE_PROVIDER_SITE_OTHER): Payer: 59 | Admitting: General Surgery

## 2012-06-28 ENCOUNTER — Encounter: Payer: Self-pay | Admitting: Internal Medicine

## 2012-06-28 ENCOUNTER — Ambulatory Visit (INDEPENDENT_AMBULATORY_CARE_PROVIDER_SITE_OTHER): Payer: 59 | Admitting: Internal Medicine

## 2012-06-28 VITALS — BP 110/80 | HR 84 | Temp 97.9°F | Resp 18 | Wt 182.0 lb

## 2012-06-28 DIAGNOSIS — R350 Frequency of micturition: Secondary | ICD-10-CM

## 2012-06-28 DIAGNOSIS — I10 Essential (primary) hypertension: Secondary | ICD-10-CM

## 2012-06-28 DIAGNOSIS — R300 Dysuria: Secondary | ICD-10-CM

## 2012-06-28 DIAGNOSIS — N390 Urinary tract infection, site not specified: Secondary | ICD-10-CM

## 2012-06-28 DIAGNOSIS — E119 Type 2 diabetes mellitus without complications: Secondary | ICD-10-CM

## 2012-06-28 LAB — POCT URINALYSIS DIPSTICK
Glucose, UA: NEGATIVE
Nitrite, UA: POSITIVE
Urobilinogen, UA: 0.2

## 2012-06-28 LAB — HEMOGLOBIN A1C
Hgb A1c MFr Bld: 7 % — ABNORMAL HIGH (ref <5.7)
Mean Plasma Glucose: 154 mg/dL — ABNORMAL HIGH (ref <117)

## 2012-06-28 MED ORDER — CIPROFLOXACIN HCL 500 MG PO TABS: 500.0000 mg | ORAL_TABLET | Freq: Two times a day (BID) | ORAL | Status: DC

## 2012-06-28 NOTE — Patient Instructions (Addendum)
Please check your hemoglobin A1c every 3 months  Take your antibiotic as prescribed until ALL of it is gone, but stop if you develop a rash, swelling, or any side effects of the medication.  Contact our office as soon as possible if  there are side effects of the medication.

## 2012-06-28 NOTE — Progress Notes (Signed)
Subjective:    Patient ID: Priscilla [name], female    DOB: 18-Nov-1950, 62 y.o.   MRN: [id number]  HPI  62 year old patient who has a history of intermittent UTIs. She presents with a two-day history of dysuria frequency and urgency. She has type 2 diabetes which has been managed on basal and bolus insulin. No recent hemoglobin A 1C. She has treated hypertension.  Past Medical History  Diagnosis Date  . ANEMIA, HX OF 12/14/2008  . ANXIETY DISORDER, HX OF 12/14/2008  . ASTHMA 12/14/2008  . DEPRESSION 01/21/2010  . DIABETES MELLITUS, TYPE II 11/12/2006  . DYSPNEA 12/14/2008  . EDEMA, ANKLES 12/14/2008  . FATIGUE 01/04/2007  . FIBROMYALGIA 08/06/2008  . HX OF GALLSTONE 12/14/2008  . HYPERLIPIDEMIA 11/12/2006  . HYPERTENSION 11/12/2006  . OSTEOARTHRITIS 12/05/2006  . PALPITATIONS, HX OF 12/14/2008  . 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 PERIPHERAL NEUROPATHY 08/06/2008  . SINUS TACHYCARDIA,HX OF 12/14/2008  . SLEEP APNEA, OBSTRUCTIVE, MODERATE 08/02/2007  . UTI'S, HX OF 12/14/2008    History   Social History  . Marital Status: Married    Spouse Name: N/A    Number of Children: N/A  . Years of Education: N/A   Occupational History  . Not on file.   Social History Main Topics  . Smoking status: Never Smoker   . Smokeless tobacco: Never Used  . Alcohol Use: Yes     Comment: glass of wine-specially occasion  . Drug Use: No  . Sexually Active: Not on file   Other Topics Concern  . Not on file   Social History Narrative  . No narrative on file    Past Surgical History  Procedure Laterality Date  . Cholecystectomy    . Abdominal hysterectomy    . Laparoscopic gastric banding    . Wrist surgery      LEFT  . Ptca      Family History  Problem Relation Age of Onset  . Other Mother     ALS  . Stroke Father   . Diabetes Sister     No Known Allergies  Current Outpatient Prescriptions on File Prior to Visit  Medication Sig Dispense Refill  . ALPRAZolam  (XANAX) 0.25 MG tablet take 1 tablet by mouth at bedtime  60 tablet  2  . aspirin 81 MG tablet Take 81 mg by mouth daily.        . B-D INS SYR ULTRAFINE 1CC/31G 31G X 5/16" 1 ML MISC use as directed DAILY  100 each  1  . calcium carbonate (OS-CAL) 600 MG TABS Take 600 mg by mouth daily.        . cyclobenzaprine (FLEXERIL) 10 MG tablet Take 10 mg by mouth at bedtime.        . DULoxetine (CYMBALTA) 60 MG capsule take 1 capsule by mouth once daily  90 capsule  1  . estradiol (ESTRACE) 1 MG tablet Take 1 mg by mouth daily.        Marland Kitchen FREESTYLE LITE test strip TEST TWICE A DAY  100 each  5  . insulin aspart (NOVOLOG) 100 UNIT/ML injection Blood sugar less than 70-no insulin; 71<CBG<150- 8 units;  151<CBG<200- 10  units;  201<CBG< 250-  12 units; CBG> 250  14 units  3 pen  PRN  . insulin detemir (LEVEMIR) 100 UNIT/ML injection 40 units at bedtime  10 mL  6  . Insulin Syringe-Needle U-100 (B-D INS SYR MICROFINE 1CC/27G) 27G X 5/8" 1 ML MISC  Use 4 times daily prior to meals and at bedtime  120 each  6  . Lancet Device MISC by Does not apply route daily.        Marland Kitchen lisinopril-hydrochlorothiazide (PRINZIDE,ZESTORETIC) 20-12.5 MG per tablet take 1 tablet by mouth once daily  90 tablet  3  . metFORMIN (GLUCOPHAGE) 1000 MG tablet Take 1 tablet (1,000 mg total) by mouth 2 (two) times daily with a meal.  180 tablet  3  . metoprolol tartrate (LOPRESSOR) 25 MG tablet take 1 tablet by mouth twice a day  180 tablet  3  . Multiple Vitamin (MULTIVITAMIN) capsule Take 1 capsule by mouth daily.        . nitroGLYCERIN (NITROSTAT) 0.4 MG SL tablet Place 0.4 mg under the tongue every 5 (five) minutes as needed.      Marland Kitchen PROAIR HFA 108 (90 BASE) MCG/ACT inhaler inhale 2 puffs by mouth every 6 hours if needed  8.5 g  6  . promethazine (PHENERGAN) 25 MG suppository Place 25 mg rectally every 4 (four) hours as needed.       No current facility-administered medications on file prior to visit.    BP 110/80  Pulse 84  Temp(Src)  97.9 F (36.6 C) (Oral)  Resp 18  Wt 182 lb (82.555 kg)  BMI 30.77 kg/m2  SpO2 96%       Review of Systems  Constitutional: Negative.   HENT: Negative for hearing loss, congestion, sore throat, rhinorrhea, dental problem, sinus pressure and tinnitus.   Eyes: Negative for pain, discharge and visual disturbance.  Respiratory: Negative for cough and shortness of breath.   Cardiovascular: Negative for chest pain, palpitations and leg swelling.  Gastrointestinal: Negative for nausea, vomiting, abdominal pain, diarrhea, constipation, blood in stool and abdominal distention.  Genitourinary: Positive for dysuria, frequency and difficulty urinating. Negative for urgency, hematuria, flank pain, vaginal bleeding, vaginal discharge, vaginal pain and pelvic pain.  Musculoskeletal: Negative for joint swelling, arthralgias and gait problem.  Skin: Negative for rash.  Neurological: Negative for dizziness, syncope, speech difficulty, weakness, numbness and headaches.  Hematological: Negative for adenopathy.  Psychiatric/Behavioral: Negative for behavioral problems, dysphoric mood and agitation. The patient is not nervous/anxious.        Objective:   Physical Exam  Constitutional: She is oriented to person, place, and time. She appears well-developed and well-nourished.  HENT:  Head: Normocephalic.  Right Ear: External ear normal.  Left Ear: External ear normal.  Mouth/Throat: Oropharynx is clear and moist.  Eyes: Conjunctivae and EOM are normal. Pupils are equal, round, and reactive to light.  Neck: Normal range of motion. Neck supple. No thyromegaly present.  Cardiovascular: Normal rate, regular rhythm, normal heart sounds and intact distal pulses.   Pulmonary/Chest: Effort normal and breath sounds normal.  Abdominal: Soft. Bowel sounds are normal. She exhibits no mass. There is no tenderness.  Musculoskeletal: Normal range of motion.  Lymphadenopathy:    She has no cervical adenopathy.   Neurological: She is alert and oriented to person, place, and time.  Skin: Skin is warm and dry. No rash noted.  Psychiatric: She has a normal mood and affect. Her behavior is normal.          Assessment & Plan:   Hypertension well controlled Diabetes mellitus unclear control. We'll check a hemoglobin A1c UTI. Will treat with Cipro  Schedule CPX in 3 months

## 2012-07-06 ENCOUNTER — Telehealth: Payer: Self-pay | Admitting: Internal Medicine

## 2012-07-06 MED ORDER — CEPHALEXIN 500 MG PO CAPS: 500.0000 mg | ORAL_CAPSULE | Freq: Two times a day (BID) | ORAL | Status: DC

## 2012-07-06 NOTE — Telephone Encounter (Signed)
Rx sent to pharmacy and patient is aware 

## 2012-07-06 NOTE — Telephone Encounter (Signed)
Switch to Keflex 500mg  BID x5- if still having sxs will need f/u appt w/ urine culture

## 2012-07-06 NOTE — Telephone Encounter (Signed)
Patient states that she was seen last week with a bladder infection and finished cipro last night. She says that she is still having the symptoms of a bladder infection. She was wanting to know if another round of antibiotics could be called in to [facility]?

## 2012-07-28 ENCOUNTER — Other Ambulatory Visit: Payer: Self-pay | Admitting: Internal Medicine

## 2012-08-09 ENCOUNTER — Encounter: Payer: Self-pay | Admitting: Family Medicine

## 2012-08-09 ENCOUNTER — Ambulatory Visit (INDEPENDENT_AMBULATORY_CARE_PROVIDER_SITE_OTHER): Payer: 59 | Admitting: Family Medicine

## 2012-08-09 VITALS — BP 112/78 | Temp 97.4°F | Wt 179.0 lb

## 2012-08-09 DIAGNOSIS — R300 Dysuria: Secondary | ICD-10-CM

## 2012-08-09 DIAGNOSIS — N390 Urinary tract infection, site not specified: Secondary | ICD-10-CM

## 2012-08-09 LAB — POCT URINALYSIS DIPSTICK
Nitrite, UA: NEGATIVE
Spec Grav, UA: 1.02
Urobilinogen, UA: 1

## 2012-08-09 MED ORDER — NITROFURANTOIN MONOHYD MACRO 100 MG PO CAPS: 100.0000 mg | ORAL_CAPSULE | Freq: Two times a day (BID) | ORAL | Status: DC

## 2012-08-09 MED ORDER — FLUCONAZOLE 150 MG PO TABS: 150.0000 mg | ORAL_TABLET | Freq: Once | ORAL | Status: DC

## 2012-08-09 NOTE — Progress Notes (Signed)
Chief Complaint  Patient presents with  . Hematuria    dysuria, frequency since yesterday     HPI:  Acute visit for ? UTI: -started yesterday -symptoms: dysuria, frequency, urgency, blood in urine -denies: fevers, chills, vomiting, flank pain -hx of recurrent UTIs, Cx from 10/2011 E. Coli - no resistance -also has issues with yeast infections when get abx  ROS: See pertinent positives and negatives per HPI.  Past Medical History  Diagnosis Date  . ANEMIA, HX OF 12/14/2008  . ANXIETY DISORDER, HX OF 12/14/2008  . ASTHMA 12/14/2008  . DEPRESSION 01/21/2010  . DIABETES MELLITUS, TYPE II 11/12/2006  . DYSPNEA 12/14/2008  . EDEMA, ANKLES 12/14/2008  . FATIGUE 01/04/2007  . FIBROMYALGIA 08/06/2008  . HX OF GALLSTONE 12/14/2008  . HYPERLIPIDEMIA 11/12/2006  . HYPERTENSION 11/12/2006  . OSTEOARTHRITIS 12/05/2006  . PALPITATIONS, HX OF 12/14/2008  . 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 PERIPHERAL NEUROPATHY 08/06/2008  . SINUS TACHYCARDIA,HX OF 12/14/2008  . SLEEP APNEA, OBSTRUCTIVE, MODERATE 08/02/2007  . UTI'S, HX OF 12/14/2008    Family History  Problem Relation Age of Onset  . Other Mother     ALS  . Stroke Father   . Diabetes Sister     History   Social History  . Marital Status: Married    Spouse Name: N/A    Number of Children: N/A  . Years of Education: N/A   Social History Main Topics  . Smoking status: Never Smoker   . Smokeless tobacco: Never Used  . Alcohol Use: Yes     Comment: glass of wine-specially occasion  . Drug Use: No  . Sexually Active: None   Other Topics Concern  . None   Social History Narrative  . None    Current outpatient prescriptions:ALPRAZolam (XANAX) 0.25 MG tablet, take 1 tablet by mouth at bedtime, Disp: 60 tablet, Rfl: 2;  aspirin 81 MG tablet, Take 81 mg by mouth daily.  , Disp: , Rfl: ;  B-D INS SYR ULTRAFINE 1CC/31G 31G X 5/16" 1 ML MISC, use as directed DAILY, Disp: 100 each, Rfl: 1;  calcium carbonate (OS-CAL) 600  MG TABS, Take 600 mg by mouth daily.  , Disp: , Rfl:  cephALEXin (KEFLEX) 500 MG capsule, Take 1 capsule (500 mg total) by mouth 2 (two) times daily., Disp: 10 capsule, Rfl: 0;  ciprofloxacin (CIPRO) 500 MG tablet, Take 1 tablet (500 mg total) by mouth 2 (two) times daily., Disp: 14 tablet, Rfl: 0;  cyclobenzaprine (FLEXERIL) 10 MG tablet, Take 10 mg by mouth at bedtime.  , Disp: , Rfl: ;  DULoxetine (CYMBALTA) 60 MG capsule, take 1 capsule by mouth once daily, Disp: 90 capsule, Rfl: 1 estradiol (ESTRACE) 1 MG tablet, Take 1 mg by mouth daily.  , Disp: , Rfl: ;  FREESTYLE LITE test strip, TEST TWICE A DAY, Disp: 100 each, Rfl: 5;  insulin aspart (NOVOLOG) 100 UNIT/ML injection, Blood sugar less than 70-no insulin; 71<CBG<150- 8 units;  151<CBG<200- 10  units;  201<CBG< 250-  12 units; CBG> 250  14 units, Disp: 3 pen, Rfl: PRN Insulin Syringe-Needle U-100 (B-D INS SYR MICROFINE 1CC/27G) 27G X 5/8" 1 ML MISC, Use 4 times daily prior to meals and at bedtime, Disp: 120 each, Rfl: 6;  Lancet Device MISC, by Does not apply route daily.  , Disp: , Rfl: ;  LEVEMIR 100 UNIT/ML injection, inject 40 units subcutaneously at bedtime, Disp: 10 mL, Rfl: 6 lisinopril-hydrochlorothiazide (PRINZIDE,ZESTORETIC) 20-12.5 MG per tablet, take  1 tablet by mouth once daily, Disp: 90 tablet, Rfl: 3;  metFORMIN (GLUCOPHAGE) 1000 MG tablet, Take 1 tablet (1,000 mg total) by mouth 2 (two) times daily with a meal., Disp: 180 tablet, Rfl: 3;  metoprolol tartrate (LOPRESSOR) 25 MG tablet, take 1 tablet by mouth twice a day, Disp: 180 tablet, Rfl: 3 Multiple Vitamin (MULTIVITAMIN) capsule, Take 1 capsule by mouth daily.  , Disp: , Rfl: ;  nitroGLYCERIN (NITROSTAT) 0.4 MG SL tablet, Place 0.4 mg under the tongue every 5 (five) minutes as needed., Disp: , Rfl: ;  PROAIR HFA 108 (90 BASE) MCG/ACT inhaler, inhale 2 puffs by mouth every 6 hours if needed, Disp: 8.5 g, Rfl: 6;  promethazine (PHENERGAN) 25 MG suppository, Place 25 mg rectally every 4  (four) hours as needed., Disp: , Rfl:  fluconazole (DIFLUCAN) 150 MG tablet, Take 1 tablet (150 mg total) by mouth once., Disp: 1 tablet, Rfl: 0;  nitrofurantoin, macrocrystal-monohydrate, (MACROBID) 100 MG capsule, Take 1 capsule (100 mg total) by mouth 2 (two) times daily., Disp: 14 capsule, Rfl: 0  EXAM:  Filed Vitals:   08/09/12 1111  BP: 112/78  Temp: 97.4 F (36.3 C)    Body mass index is 30.26 kg/(m^2).  GENERAL: vitals reviewed and listed above, alert, oriented, appears well hydrated and in no acute distress  HEENT: atraumatic, conjunttiva clear, no obvious abnormalities on inspection of external nose and ears  NECK: no obvious masses on inspection  LUNGS: clear to auscultation bilaterally, no wheezes, rales or rhonchi, good air movement  CV: HRRR, no peripheral edema  ABD: soft, NTTP, BS +, no CVA TTP  MS: moves all extremities without noticeable abnormality  PSYCH: pleasant and cooperative, no obvious depression or anxiety  ASSESSMENT AND PLAN:  Discussed the following assessment and plan:  Dysuria - Plan: POCT urinalysis dipstick, Culture, Urine  UTI (urinary tract infection) - Plan: nitrofurantoin, macrocrystal-monohydrate, (MACROBID) 100 MG capsule, fluconazole (DIFLUCAN) 150 MG tablet  -urine dip abn, tx with abx - risks discussed, culture pending -discussed UTI prevention strategies and supportive care -advised if persistent recurrent UTIs see PCP - may need prophylaxtic tx -diflucan given in case of yeast infection - she reports OTC products don't work for her -Patient advised to return or notify a doctor immediately if symptoms worsen or persist or new concerns arise.  There are no Patient Instructions on file for this visit.   Kriste Basque R.

## 2012-08-13 ENCOUNTER — Telehealth: Payer: Self-pay | Admitting: Family Medicine

## 2012-08-13 LAB — URINE CULTURE: Colony Count: >=100000

## 2012-08-13 NOTE — Telephone Encounter (Signed)
Called and left a message for pt to return call.  

## 2012-08-13 NOTE — Telephone Encounter (Signed)
Culture did show UTI. Complete abx - follow up with PCP if persistent symptom after abx.

## 2012-08-13 NOTE — Telephone Encounter (Signed)
Called and spoke with pt and pt is aware. Pt states she does feel a little better.

## 2012-08-15 ENCOUNTER — Encounter (INDEPENDENT_AMBULATORY_CARE_PROVIDER_SITE_OTHER): Payer: 59

## 2012-08-22 ENCOUNTER — Other Ambulatory Visit: Payer: Self-pay | Admitting: Internal Medicine

## 2012-08-22 DIAGNOSIS — R921 Mammographic calcification found on diagnostic imaging of breast: Secondary | ICD-10-CM

## 2012-08-23 ENCOUNTER — Other Ambulatory Visit: Payer: Self-pay | Admitting: Internal Medicine

## 2012-09-02 ENCOUNTER — Other Ambulatory Visit: Payer: Self-pay | Admitting: Internal Medicine

## 2012-09-04 ENCOUNTER — Encounter: Payer: Self-pay | Admitting: Family Medicine

## 2012-09-04 ENCOUNTER — Ambulatory Visit (INDEPENDENT_AMBULATORY_CARE_PROVIDER_SITE_OTHER): Payer: 59 | Admitting: Family Medicine

## 2012-09-04 VITALS — BP 120/80 | Temp 98.0°F | Wt 183.0 lb

## 2012-09-04 DIAGNOSIS — Z87448 Personal history of other diseases of urinary system: Secondary | ICD-10-CM

## 2012-09-04 DIAGNOSIS — R300 Dysuria: Secondary | ICD-10-CM

## 2012-09-04 LAB — POCT URINALYSIS DIPSTICK
Bilirubin, UA: NEGATIVE
Ketones, UA: NEGATIVE
Protein, UA: NEGATIVE

## 2012-09-04 MED ORDER — CIPROFLOXACIN HCL 250 MG PO TABS: ORAL_TABLET | ORAL | Status: DC

## 2012-09-04 NOTE — Progress Notes (Signed)
  Subjective:    Patient ID: Priscilla [name], female    DOB: 1950-08-07, 62 y.o.   MRN: [id number]  Priscilla [name] is a 62 year old female nonsmoker who comes in today for evaluation of urinary tract symptoms x24 hours  Priscilla [name] states in the past year Priscilla [name]'s had 6 urinary tract infections. Her last urine tract infection Priscilla [name] was seen here by Dr. Selena Batten. Priscilla [name] was given Cipro 500 twice a day for 10 days which Priscilla [name] finished 2 and half weeks ago. Yesterday Priscilla [name] began having dysuria and frequency and no blood. No fever chills or back pain. Priscilla [name] states the medication and Dr. Tylene Fantasia gave her pretty much resolved her symptoms in a day or 2 and Priscilla [name] took the medicine until the bottle is empty.  Review of systems he uses no sprays foams douches etc. Priscilla [name]'ll is take showers no bubble baths no hot tubs.  Her blood sugar is in the 100 range Priscilla [name] says her last A1c was 7.0%  Priscilla [name] states about 10 years ago Priscilla [name] had a cystoscopy Priscilla [name]'s not sure why. Priscilla [name]'s had a history of urinary frequency and incontinence has gotten worse over the past 2-3 years. Priscilla [name] feels like Priscilla [name] empties her bladder well and has not had a history of urinary retention.  Her urinalysis today shows small amount of blood moderate white cells nitrite negative. Because of her recurrent infections I attempted to do an in and out cath to get a pair specimen of urine for a culture. However after drinking 4 glasses a water her bladder was empty.    Review of Systems Review of systems otherwise negative    Objective:   Physical Exam  Well-developed well-nourished female no acute distress examination the abdomen was negative except for scar lower midline from previous extract to make. Attempt to do a in and out cath failed because the bladder was empty      Assessment & Plan:  Repeat urinary tract infections,,,,,,, 6 in one year,,,,,,,, plan culture clean catch urine although the clean catch is obviously not ideal. The restart Cipro followup in one week

## 2012-09-04 NOTE — Patient Instructions (Addendum)
Restart the Cipro  one twice daily x1 week then 1 at bedtime to bottle empty  Drink 32 ounces of water daily  Pyridium,,,,,,,, 3 times daily when necessary  I will call you and I get urine culture back

## 2012-09-07 LAB — URINE CULTURE

## 2012-10-04 ENCOUNTER — Other Ambulatory Visit: Payer: Self-pay | Admitting: Internal Medicine

## 2012-10-04 ENCOUNTER — Ambulatory Visit
Admission: RE | Admit: 2012-10-04 | Discharge: 2012-10-04 | Disposition: A | Discharge status: Home / Self Care | Payer: 59 | Source: Ambulatory Visit | Attending: Internal Medicine | Admitting: Internal Medicine

## 2012-10-04 DIAGNOSIS — N6320 Unspecified lump in the left breast, unspecified quadrant: Secondary | ICD-10-CM

## 2012-10-04 DIAGNOSIS — R921 Mammographic calcification found on diagnostic imaging of breast: Secondary | ICD-10-CM

## 2012-10-09 ENCOUNTER — Other Ambulatory Visit: Payer: Self-pay | Admitting: Internal Medicine

## 2012-11-08 ENCOUNTER — Other Ambulatory Visit: Payer: Self-pay | Admitting: Internal Medicine

## 2012-12-04 ENCOUNTER — Other Ambulatory Visit: Payer: Self-pay | Admitting: Internal Medicine

## 2012-12-20 ENCOUNTER — Other Ambulatory Visit: Payer: Self-pay | Admitting: Internal Medicine

## 2013-01-25 ENCOUNTER — Other Ambulatory Visit: Payer: Self-pay | Admitting: Internal Medicine

## 2013-02-02 ENCOUNTER — Other Ambulatory Visit: Payer: Self-pay | Admitting: Internal Medicine

## 2013-02-03 ENCOUNTER — Telehealth: Payer: Self-pay | Admitting: Internal Medicine

## 2013-02-03 MED ORDER — METOPROLOL TARTRATE 25 MG PO TABS: ORAL_TABLET | ORAL | Status: DC

## 2013-02-03 NOTE — Telephone Encounter (Signed)
Pt states rx for metoprolol tartrate (LOPRESSOR) 25 MG tablet was denied stating she needs an ov.  Pt has been scheduled to come in 10/31 @ 1pm.  However pt is out of medication and needs enough to hold her over until she can come in on Friday.  Pharmacy: [facility] [place]

## 2013-02-03 NOTE — Telephone Encounter (Signed)
Left detailed message Rx was sent to pharmacy. 

## 2013-02-07 ENCOUNTER — Ambulatory Visit (INDEPENDENT_AMBULATORY_CARE_PROVIDER_SITE_OTHER): Payer: 59 | Admitting: Internal Medicine

## 2013-02-07 ENCOUNTER — Encounter: Payer: Self-pay | Admitting: Internal Medicine

## 2013-02-07 VITALS — BP 104/70 | Temp 97.9°F | Wt 183.0 lb

## 2013-02-07 DIAGNOSIS — I10 Essential (primary) hypertension: Secondary | ICD-10-CM

## 2013-02-07 DIAGNOSIS — Z87440 Personal history of urinary (tract) infections: Secondary | ICD-10-CM

## 2013-02-07 DIAGNOSIS — Z23 Encounter for immunization: Secondary | ICD-10-CM

## 2013-02-07 DIAGNOSIS — E119 Type 2 diabetes mellitus without complications: Secondary | ICD-10-CM

## 2013-02-07 DIAGNOSIS — E785 Hyperlipidemia, unspecified: Secondary | ICD-10-CM

## 2013-02-07 DIAGNOSIS — G609 Hereditary and idiopathic neuropathy, unspecified: Secondary | ICD-10-CM

## 2013-02-07 DIAGNOSIS — Z87448 Personal history of other diseases of urinary system: Secondary | ICD-10-CM

## 2013-02-07 LAB — POCT URINALYSIS DIPSTICK
Bilirubin, UA: NEGATIVE
Glucose, UA: NEGATIVE
Ketones, UA: NEGATIVE
Protein, UA: NEGATIVE

## 2013-02-07 LAB — HEMOGLOBIN A1C: Hgb A1c MFr Bld: 7.3 % — ABNORMAL HIGH (ref 4.6–6.5)

## 2013-02-07 NOTE — Patient Instructions (Signed)
Limit your sodium (Salt) intake   Please check your hemoglobin A1c every 3 months    It is important that you exercise regularly, at least 20 minutes 3 to 4 times per week.  If you develop chest pain or shortness of breath seek  medical attention.  Please see your eye doctor yearly to check for diabetic eye damage  

## 2013-02-07 NOTE — Progress Notes (Signed)
Subjective:    Patient ID: Priscilla [name], female    DOB: Feb 19, 1951, 62 y.o.   MRN: [id number]  HPI  62 year old patient who is seen today for followup of type 2 diabetes she has history of hypertension dyslipidemia coronary artery disease. She has a secondary peripheral neuropathy. She has a history also of recurrent UTIs. She has recently had 5 days of Cipro antibiotics. In general doing quite well. Her last hemoglobin A1c 7.0. This was 7 months of ago   She is scheduled for a followup eye examination in January  Past Medical History  Diagnosis Date  . ANEMIA, HX OF 12/14/2008  . ANXIETY DISORDER, HX OF 12/14/2008  . ASTHMA 12/14/2008  . DEPRESSION 01/21/2010  . DIABETES MELLITUS, TYPE II 11/12/2006  . DYSPNEA 12/14/2008  . EDEMA, ANKLES 12/14/2008  . FATIGUE 01/04/2007  . FIBROMYALGIA 08/06/2008  . HX OF GALLSTONE 12/14/2008  . HYPERLIPIDEMIA 11/12/2006  . HYPERTENSION 11/12/2006  . OSTEOARTHRITIS 12/05/2006  . PALPITATIONS, HX OF 12/14/2008  . 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 PERIPHERAL NEUROPATHY 08/06/2008  . SINUS TACHYCARDIA,HX OF 12/14/2008  . SLEEP APNEA, OBSTRUCTIVE, MODERATE 08/02/2007  . UTI'S, HX OF 12/14/2008    History   Social History  . Marital Status: Married    Spouse Name: N/A    Number of Children: N/A  . Years of Education: N/A   Occupational History  . Not on file.   Social History Main Topics  . Smoking status: Never Smoker   . Smokeless tobacco: Never Used  . Alcohol Use: Yes     Comment: glass of wine-specially occasion  . Drug Use: No  . Sexual Activity: Not on file   Other Topics Concern  . Not on file   Social History Narrative  . No narrative on file    Past Surgical History  Procedure Laterality Date  . Cholecystectomy    . Abdominal hysterectomy    . Laparoscopic gastric banding    . Wrist surgery      LEFT  . Ptca      Family History  Problem Relation Age of Onset  . Other Mother     ALS  . Stroke  Father   . Diabetes Sister     No Known Allergies  Current Outpatient Prescriptions on File Prior to Visit  Medication Sig Dispense Refill  . ALPRAZolam (XANAX) 0.25 MG tablet Take 1 tablet (0.25 mg total) by mouth at bedtime as needed for sleep.  60 tablet  2  . aspirin 81 MG tablet Take 81 mg by mouth daily.        . B-D INS SYR ULTRAFINE 1CC/31G 31G X 5/16" 1 ML MISC use as directed DAILY  100 each  1  . calcium carbonate (OS-CAL) 600 MG TABS Take 600 mg by mouth daily.        . cyclobenzaprine (FLEXERIL) 10 MG tablet Take 10 mg by mouth at bedtime.        . DULoxetine (CYMBALTA) 60 MG capsule take 1 capsule by mouth once daily  90 capsule  1  . FREESTYLE LITE test strip TEST TWICE A DAY  100 each  5  . insulin aspart (NOVOLOG) 100 UNIT/ML injection Blood sugar less than 70-no insulin; 71<CBG<150- 8 units;  151<CBG<200- 10  units;  201<CBG< 250-  12 units; CBG> 250  14 units  3 pen  PRN  . Insulin Syringe-Needle U-100 (B-D INS SYR MICROFINE 1CC/27G) 27G X 5/8" 1 ML MISC  Use 4 times daily prior to meals and at bedtime  120 each  6  . Lancet Device MISC by Does not apply route daily.        Marland Kitchen LEVEMIR 100 UNIT/ML injection inject 40 units subcutaneously at bedtime  10 mL  0  . lisinopril-hydrochlorothiazide (PRINZIDE,ZESTORETIC) 20-12.5 MG per tablet take 1 tablet by mouth once daily  90 tablet  3  . metFORMIN (GLUCOPHAGE) 1000 MG tablet take 1 tablet by mouth twice a day WITH A MEAL  180 tablet  0  . metoprolol tartrate (LOPRESSOR) 25 MG tablet take 1 tablet by mouth twice a day  60 tablet  0  . Multiple Vitamin (MULTIVITAMIN) capsule Take 1 capsule by mouth daily.        . nitroGLYCERIN (NITROSTAT) 0.4 MG SL tablet Place 0.4 mg under the tongue every 5 (five) minutes as needed.      Marland Kitchen PROAIR HFA 108 (90 BASE) MCG/ACT inhaler inhale 2 puffs by mouth every 6 hours if needed  8.5 g  6  . promethazine (PHENERGAN) 25 MG suppository Place 25 mg rectally every 4 (four) hours as needed.        No current facility-administered medications on file prior to visit.    BP 104/70  Temp(Src) 97.9 F (36.6 C) (Oral)  Wt 183 lb (83.008 kg)  BMI 30.94 kg/m2       Review of Systems  Constitutional: Negative.   HENT: Negative for congestion, dental problem, hearing loss, rhinorrhea, sinus pressure, sore throat and tinnitus.   Eyes: Negative for pain, discharge and visual disturbance.  Respiratory: Negative for cough and shortness of breath.   Cardiovascular: Negative for chest pain, palpitations and leg swelling.  Gastrointestinal: Negative for nausea, vomiting, abdominal pain, diarrhea, constipation, blood in stool and abdominal distention.  Genitourinary: Negative for dysuria, urgency, frequency, hematuria, flank pain, vaginal bleeding, vaginal discharge, difficulty urinating, vaginal pain and pelvic pain.  Musculoskeletal: Negative for arthralgias, gait problem and joint swelling.  Skin: Negative for rash.  Neurological: Negative for dizziness, syncope, speech difficulty, weakness, numbness and headaches.  Hematological: Negative for adenopathy.  Psychiatric/Behavioral: Negative for behavioral problems, dysphoric mood and agitation. The patient is not nervous/anxious.        Objective:   Physical Exam  Constitutional: She is oriented to person, place, and time. She appears well-developed and well-nourished.  HENT:  Head: Normocephalic.  Right Ear: External ear normal.  Left Ear: External ear normal.  Mouth/Throat: Oropharynx is clear and moist.  Eyes: Conjunctivae and EOM are normal. Pupils are equal, round, and reactive to light.  Neck: Normal range of motion. Neck supple. No thyromegaly present.  Cardiovascular: Normal rate, regular rhythm, normal heart sounds and intact distal pulses.   Pulmonary/Chest: Effort normal and breath sounds normal.  Abdominal: Soft. Bowel sounds are normal. She exhibits no mass. There is no tenderness.  Musculoskeletal: Normal range of  motion.  Lymphadenopathy:    She has no cervical adenopathy.  Neurological: She is alert and oriented to person, place, and time.  Skin: Skin is warm and dry. No rash noted.  Psychiatric: She has a normal mood and affect. Her behavior is normal.          Assessment & Plan:   Diabetes mellitus. We'll check a hemoglobin A1c Hypertension well controlled. Blood pressure low normal range History of UTI. UA unremarkable dyslipidemia. We'll check a lipid profile next visit  No change on medical therapy

## 2013-02-18 ENCOUNTER — Other Ambulatory Visit: Payer: Self-pay | Admitting: Internal Medicine

## 2013-02-25 ENCOUNTER — Other Ambulatory Visit: Payer: Self-pay | Admitting: Internal Medicine

## 2013-03-08 ENCOUNTER — Other Ambulatory Visit: Payer: Self-pay | Admitting: Internal Medicine

## 2013-04-01 ENCOUNTER — Other Ambulatory Visit: Payer: Self-pay | Admitting: Internal Medicine

## 2013-04-15 LAB — HM DIABETES EYE EXAM

## 2013-04-19 ENCOUNTER — Other Ambulatory Visit: Payer: Self-pay | Admitting: Internal Medicine

## 2013-04-23 ENCOUNTER — Encounter: Payer: Self-pay | Admitting: Internal Medicine

## 2013-05-02 ENCOUNTER — Other Ambulatory Visit: Payer: Self-pay | Admitting: Internal Medicine

## 2013-05-02 DIAGNOSIS — Z09 Encounter for follow-up examination after completed treatment for conditions other than malignant neoplasm: Secondary | ICD-10-CM

## 2013-05-02 DIAGNOSIS — N6320 Unspecified lump in the left breast, unspecified quadrant: Secondary | ICD-10-CM

## 2013-05-16 ENCOUNTER — Ambulatory Visit
Admission: RE | Admit: 2013-05-16 | Discharge: 2013-05-16 | Disposition: A | Discharge status: Home / Self Care | Payer: 59 | Source: Ambulatory Visit | Attending: Internal Medicine | Admitting: Internal Medicine

## 2013-05-16 DIAGNOSIS — N6320 Unspecified lump in the left breast, unspecified quadrant: Secondary | ICD-10-CM

## 2013-06-05 ENCOUNTER — Other Ambulatory Visit: Payer: Self-pay | Admitting: Internal Medicine

## 2013-06-06 MED ORDER — DULOXETINE HCL 60 MG PO CPEP
ORAL_CAPSULE | ORAL | Status: DC
Start: 2013-06-06 — End: 2013-10-06

## 2013-06-06 MED ORDER — LEVEMIR 100 UNIT/ML ~~LOC~~ SOLN: SUBCUTANEOUS | Status: DC

## 2013-06-06 NOTE — Addendum Note (Signed)
Addended by: Westley Hummer B on: 06/06/2013 10:07 AM   Modules accepted: Orders

## 2013-06-12 ENCOUNTER — Encounter (INDEPENDENT_AMBULATORY_CARE_PROVIDER_SITE_OTHER): Payer: 59

## 2013-06-26 ENCOUNTER — Encounter (INDEPENDENT_AMBULATORY_CARE_PROVIDER_SITE_OTHER): Payer: Self-pay

## 2013-06-26 ENCOUNTER — Ambulatory Visit (INDEPENDENT_AMBULATORY_CARE_PROVIDER_SITE_OTHER): Payer: Commercial Managed Care - PPO | Admitting: Physician Assistant

## 2013-06-26 VITALS — BP 116/80 | HR 81 | Temp 98.6°F | Resp 14 | Ht 64.5 in | Wt 179.0 lb

## 2013-06-26 DIAGNOSIS — Z4651 Encounter for fitting and adjustment of gastric lap band: Secondary | ICD-10-CM

## 2013-06-26 DIAGNOSIS — R1310 Dysphagia, unspecified: Secondary | ICD-10-CM

## 2013-06-26 NOTE — Patient Instructions (Signed)
Return in three weeks. Focus on good food choices as well as physical activity. Return sooner if you have an increase in hunger, portion sizes or weight. Return also for difficulty swallowing, night cough, reflux.

## 2013-06-26 NOTE — Progress Notes (Signed)
  HISTORY: Priscilla [name] is a 63 y.o.female who received an AP-Standard lap-band in December 2010 by Dr. Excell Seltzer. She comes in with a 1-year history of solid food dysphagia with some difficulty swallowing liquids unless they're taken very slowly. She was last seen in December 2012. Last fill was April 2012.  VITAL SIGNS: Filed Vitals:   06/26/13 1540  BP: 116/80  Pulse: 81  Temp: 98.6 F (37 C)  Resp: 14    PHYSICAL EXAM: Physical exam reveals a very well-appearing 63 y.o.female in no apparent distress Neurologic: Awake, alert, oriented Psych: Bright affect, conversant Respiratory: Breathing even and unlabored. No stridor or wheezing Abdomen: Soft, nontender, nondistended to palpation. Incisions well-healed. No incisional hernias. Port easily palpated. Extremities: Atraumatic, good range of motion.  ASSESMENT: 63 y.o.  female  s/p AP-Standard lap-band.   PLAN: The patient's port was accessed with a 20G Huber needle without difficulty. Clear fluid was aspirated and 1 mL saline was removed from the port to give a total predicted volume of 4.25 mL. She was able to swallow water with much greater ease after this. The patient was advised to concentrate on healthy food choices and to avoid slider foods high in fats and carbohydrates. I've ordered a KUB to evaluate band placement. I'll have her back in three weeks or sooner should symptoms persist or if there are concerning findings on KUB.

## 2013-06-27 ENCOUNTER — Ambulatory Visit
Admission: RE | Admit: 2013-06-27 | Discharge: 2013-06-27 | Disposition: A | Discharge status: Home / Self Care | Payer: 59 | Source: Ambulatory Visit | Attending: Physician Assistant | Admitting: Physician Assistant

## 2013-06-27 ENCOUNTER — Telehealth (INDEPENDENT_AMBULATORY_CARE_PROVIDER_SITE_OTHER): Payer: Self-pay | Admitting: *Deleted

## 2013-06-27 DIAGNOSIS — R1310 Dysphagia, unspecified: Secondary | ICD-10-CM

## 2013-06-27 DIAGNOSIS — Z4651 Encounter for fitting and adjustment of gastric lap band: Secondary | ICD-10-CM

## 2013-06-27 NOTE — Telephone Encounter (Signed)
Called and spoke to pt. Made her aware that she can just walk into the medical center and have her xray done.  Pt aware and is in agreeance.Marland Kitchen

## 2013-07-03 ENCOUNTER — Telehealth: Payer: Self-pay | Admitting: Internal Medicine

## 2013-07-03 NOTE — Telephone Encounter (Signed)
[place], [place] is requesting re-fills on the following:  FREESTYLE LITE test strip Insulin Syringe-Needle U-100 (B-D INS SYR MICROFINE 1CC/27G) 27G X 5/8" 1 ML MISC

## 2013-07-03 NOTE — Telephone Encounter (Signed)
Left message on voicemail to call office.  

## 2013-07-07 NOTE — Telephone Encounter (Signed)
Left message on voicemail to call office.  

## 2013-07-08 NOTE — Telephone Encounter (Signed)
Left message on voicemail to call office.  

## 2013-07-10 NOTE — Telephone Encounter (Signed)
Left detailed message about request and if needs refills or not.

## 2013-07-16 ENCOUNTER — Other Ambulatory Visit: Payer: Self-pay | Admitting: Internal Medicine

## 2013-07-17 ENCOUNTER — Encounter (INDEPENDENT_AMBULATORY_CARE_PROVIDER_SITE_OTHER): Payer: Commercial Managed Care - PPO

## 2013-08-07 ENCOUNTER — Encounter (INDEPENDENT_AMBULATORY_CARE_PROVIDER_SITE_OTHER): Payer: Self-pay

## 2013-08-07 ENCOUNTER — Ambulatory Visit (INDEPENDENT_AMBULATORY_CARE_PROVIDER_SITE_OTHER): Payer: Commercial Managed Care - PPO | Admitting: Physician Assistant

## 2013-08-07 VITALS — BP 122/78 | HR 76 | Temp 98.3°F | Ht 64.0 in | Wt 187.0 lb

## 2013-08-07 DIAGNOSIS — Z4651 Encounter for fitting and adjustment of gastric lap band: Secondary | ICD-10-CM

## 2013-08-07 NOTE — Progress Notes (Signed)
  HISTORY: Priscilla [name] is a 63 y.o.female who received an AP-Standard lap-band in December 2010 by Dr. Excell Seltzer. She comes in with 8 lbs weight gain since her last visit one month ago. She had 1 mL removed at that time for a year's worth of dysphagia. The KUB ordered then showed good position of the band. She is able to eat solids (and nutritious ones) without difficulty now. She would like a fill but not so much to over-restrict her.  VITAL SIGNS: Filed Vitals:   08/07/13 1600  BP: 122/78  Pulse: 76  Temp: 98.3 F (36.8 C)    PHYSICAL EXAM: Physical exam reveals a very well-appearing 63 y.o.female in no apparent distress Neurologic: Awake, alert, oriented Psych: Bright affect, conversant Respiratory: Breathing even and unlabored. No stridor or wheezing Abdomen: Soft, nontender, nondistended to palpation. Incisions well-healed. No incisional hernias. Port easily palpated. Extremities: Atraumatic, good range of motion.  ASSESMENT: 63 y.o.  female  s/p AP-Standard lap-band.   PLAN: The patient's port was accessed with a 20G Huber needle without difficulty. Clear fluid was aspirated and 0.5 mL saline was added to the port to give a total predicted volume of 4.75 mL. The patient was able to swallow water without difficulty following the procedure and was instructed to take clear liquids for the next 24-48 hours and advance slowly as tolerated.

## 2013-08-07 NOTE — Patient Instructions (Signed)

## 2013-08-17 ENCOUNTER — Encounter (HOSPITAL_COMMUNITY): Payer: Self-pay | Admitting: Emergency Medicine

## 2013-08-17 ENCOUNTER — Emergency Department (HOSPITAL_COMMUNITY)
Admission: EM | Admit: 2013-08-17 | Discharge: 2013-08-17 | Disposition: A | Discharge status: Home / Self Care | Payer: 59 | Source: Home / Self Care | Attending: Emergency Medicine | Admitting: Emergency Medicine

## 2013-08-17 DIAGNOSIS — N3000 Acute cystitis without hematuria: Secondary | ICD-10-CM

## 2013-08-17 LAB — POCT URINALYSIS DIP (DEVICE)
Bilirubin Urine: NEGATIVE
Glucose, UA: NEGATIVE mg/dL
Ketones, ur: NEGATIVE mg/dL
Nitrite: NEGATIVE
PH: 7 (ref 5.0–8.0)
PROTEIN: 30 mg/dL — AB
Specific Gravity, Urine: 1.015 (ref 1.005–1.030)
Urobilinogen, UA: 1 mg/dL (ref 0.0–1.0)

## 2013-08-17 MED ORDER — CEPHALEXIN 500 MG PO CAPS: 500.0000 mg | ORAL_CAPSULE | Freq: Three times a day (TID) | ORAL | Status: DC

## 2013-08-17 MED ORDER — PHENAZOPYRIDINE HCL 200 MG PO TABS: 200.0000 mg | ORAL_TABLET | Freq: Three times a day (TID) | ORAL | Status: DC | PRN

## 2013-08-17 NOTE — Discharge Instructions (Signed)
Urinary Tract Infection  Urinary tract infections (UTIs) can develop anywhere along your urinary tract. Your urinary tract is your body's drainage system for removing wastes and extra water. Your urinary tract includes two kidneys, two ureters, a bladder, and a urethra. Your kidneys are a pair of bean-shaped organs. Each kidney is about the size of your fist. They are located below your ribs, one on each side of your spine.  CAUSES  Infections are caused by microbes, which are microscopic organisms, including fungi, viruses, and bacteria. These organisms are so small that they can only be seen through a microscope. Bacteria are the microbes that most commonly cause UTIs.  SYMPTOMS   Symptoms of UTIs may vary by age and gender of the patient and by the location of the infection. Symptoms in young women typically include a frequent and intense urge to urinate and a painful, burning feeling in the bladder or urethra during urination. Older women and men are more likely to be tired, shaky, and weak and have muscle aches and abdominal pain. A fever may mean the infection is in your kidneys. Other symptoms of a kidney infection include pain in your back or sides below the ribs, nausea, and vomiting.  DIAGNOSIS  To diagnose a UTI, your caregiver will ask you about your symptoms. Your caregiver also will ask to provide a urine sample. The urine sample will be tested for bacteria and white blood cells. White blood cells are made by your body to help fight infection.  TREATMENT   Typically, UTIs can be treated with medication. Because most UTIs are caused by a bacterial infection, they usually can be treated with the use of antibiotics. The choice of antibiotic and length of treatment depend on your symptoms and the type of bacteria causing your infection.  HOME CARE INSTRUCTIONS   If you were prescribed antibiotics, take them exactly as your caregiver instructs you. Finish the medication even if you feel better after you  have only taken some of the medication.   Drink enough water and fluids to keep your urine clear or pale yellow.   Avoid caffeine, tea, and carbonated beverages. They tend to irritate your bladder.   Empty your bladder often. Avoid holding urine for long periods of time.   Empty your bladder before and after sexual intercourse.   After a bowel movement, women should cleanse from front to back. Use each tissue only once.  SEEK MEDICAL CARE IF:    You have back pain.   You develop a fever.   Your symptoms do not begin to resolve within 3 days.  SEEK IMMEDIATE MEDICAL CARE IF:    You have severe back pain or lower abdominal pain.   You develop chills.   You have nausea or vomiting.   You have continued burning or discomfort with urination.  MAKE SURE YOU:    Understand these instructions.   Will watch your condition.   Will get help right away if you are not doing well or get worse.  Document Released: 01/04/2005 Document Revised: 09/26/2011 Document Reviewed: 05/05/2011  Exit[employer] Patient Information 2014 [employer].

## 2013-08-17 NOTE — ED Provider Notes (Signed)
Chief Complaint   Chief Complaint  Patient presents with  . Cystitis    History of Present Illness   Priscilla [name] is a 63 year old female who has had a history since last night of dysuria, urinary frequency, and urgency. She denies any hematuria. She's had some suprapubic discomfort but denies any lower back pain. No fever, chills, nausea, vomiting, or GYN symptoms. She's had frequent UTIs in the past, the last one being about 3 months ago.  Review of Systems   Other than as noted above, the patient denies any of the following symptoms: General:  No fevers or chills. GI:  No abdominal pain, back pain, nausea, or vomiting. GU:  No hematuria or incontinence. GYN:  No discharge, itching, vulvar pain or lesions, pelvic pain, or abnormal vaginal bleeding.  [place]   Past medical history, family history, social history, meds, and allergies were reviewed.  She has diabetes, hypertension, and fibromyalgia. Current meds include aspirin, Cymbalta, Levemir insulin, lisinopril/hydrochlorothiazide, metformin, metoprolol, Xanax, Flexeril, Nitrostat, and albuterol. She has no known medication allergies.  Physical Examination     Vital signs:  BP 111/66  Pulse 65  Temp(Src) 98.8 F (37.1 C) (Oral)  Resp 16  SpO2 99% Gen:  Alert, oriented, in no distress. Lungs:  Clear to auscultation, no wheezes, rales or rhonchi. Heart:  Regular rhythm, no gallop or murmer. Abdomen:  Flat and soft. There was slight suprapubic pain to palpation.  No guarding, or rebound.  No hepato-splenomegaly or mass.  Bowel sounds were normally active.  No hernia. Back:  No CVA tenderness.  Skin:  Clear, warm and dry.  Labs   Results for orders placed during the hospital encounter of 08/17/13  POCT URINALYSIS DIP (DEVICE)      Result Value Ref Range   Glucose, UA NEGATIVE  NEGATIVE mg/dL   Bilirubin Urine NEGATIVE  NEGATIVE   Ketones, ur NEGATIVE  NEGATIVE mg/dL   Specific Gravity, Urine 1.015  1.005 - 1.030   Hgb urine dipstick TRACE (*) NEGATIVE   pH 7.0  5.0 - 8.0   Protein, ur 30 (*) NEGATIVE mg/dL   Urobilinogen, UA 1.0  0.0 - 1.0 mg/dL   Nitrite NEGATIVE  NEGATIVE   Leukocytes, UA MODERATE (*) NEGATIVE     A urine culture was obtained.  Results are pending at this time and we will call about any positive results.  Assessment   The encounter diagnosis was Acute cystitis.   No evidence of pyelonephritis.    Plan   1.  Meds:  The following meds were prescribed:   New Prescriptions   CEPHALEXIN (KEFLEX) 500 MG CAPSULE    Take 1 capsule (500 mg total) by mouth 3 (three) times daily.   PHENAZOPYRIDINE (PYRIDIUM) 200 MG TABLET    Take 1 tablet (200 mg total) by mouth 3 (three) times daily as needed for pain.    2.  Patient Education/Counseling:  The patient was given appropriate handouts, self care instructions, and instructed in symptomatic relief. The patient was told to avoid intercourse for 10 days, get extra fluids, and return for a follow up with her primary care doctor at the completion of treatment for a repeat UA and culture.   3.  Follow up:  The patient was told to follow up here if no better in 3 to 4 days, or sooner if becoming worse in any way, and given some red flag symptoms such as fever, persistent vomiting, or severe flank or abdominal pain which would prompt  immediate return.     Harden Mo, MD 08/17/13 1050

## 2013-08-17 NOTE — ED Notes (Signed)
Patient complains of burning sensation with urination and aching in bladder with cloudy urine.

## 2013-08-19 LAB — URINE CULTURE: SPECIAL REQUESTS: NORMAL

## 2013-08-19 NOTE — Progress Notes (Signed)
Quick Note:  Results are abnormal as noted, but have been adequately treated. No further action necessary. ______ 

## 2013-08-19 NOTE — ED Notes (Signed)
Urine culture:>100,000 colonies Proteus Mirabilis.  Pt. adequately treated with Keflex. Priscilla [name] [facility] 08/19/2013

## 2013-08-26 ENCOUNTER — Telehealth: Payer: Self-pay | Admitting: Internal Medicine

## 2013-08-26 MED ORDER — ALPRAZOLAM 0.25 MG PO TABS: ORAL_TABLET | ORAL | Status: DC

## 2013-08-26 MED ORDER — "INSULIN SYRINGE-NEEDLE U-100 27G X 5/8"" 1 ML MISC": Status: DC

## 2013-08-26 NOTE — Telephone Encounter (Signed)
Re-fill request for Insulin Syringe-Needle U-100 (B-D INS SYR MICROFINE 1CC/27G) 27G X 5/8" 1 ML MISC [facility]

## 2013-08-26 NOTE — Telephone Encounter (Signed)
[place], [place] is requesting re-fill on ALPRAZolam (XANAX) 0.25 MG tablet

## 2013-09-12 ENCOUNTER — Other Ambulatory Visit: Payer: Self-pay | Admitting: Internal Medicine

## 2013-09-18 ENCOUNTER — Other Ambulatory Visit: Payer: Self-pay | Admitting: Internal Medicine

## 2013-09-19 ENCOUNTER — Encounter: Payer: Self-pay | Admitting: Family Medicine

## 2013-09-19 ENCOUNTER — Ambulatory Visit (INDEPENDENT_AMBULATORY_CARE_PROVIDER_SITE_OTHER): Payer: 59 | Admitting: Family Medicine

## 2013-09-19 VITALS — BP 118/76 | HR 102 | Temp 98.8°F | Ht 64.0 in | Wt 190.5 lb

## 2013-09-19 DIAGNOSIS — R300 Dysuria: Secondary | ICD-10-CM

## 2013-09-19 LAB — POCT URINALYSIS DIPSTICK
Bilirubin, UA: NEGATIVE
Blood, UA: NEGATIVE
GLUCOSE UA: NEGATIVE
KETONES UA: NEGATIVE
Nitrite, UA: POSITIVE
Protein, UA: NEGATIVE
SPEC GRAV UA: 1.02
Urobilinogen, UA: 0.2
pH, UA: 6

## 2013-09-19 MED ORDER — CIPROFLOXACIN HCL 500 MG PO TABS: 500.0000 mg | ORAL_TABLET | Freq: Two times a day (BID) | ORAL | Status: DC

## 2013-09-19 NOTE — Progress Notes (Signed)
Pre visit review using our clinic review tool, if applicable. No additional management support is needed unless otherwise documented below in the visit note. 

## 2013-09-19 NOTE — Progress Notes (Signed)
No chief complaint on file.   HPI:  Acute visit for:  1)Dysuria: -started last night -symptoms: frequency, urgency, dysuria -hx of frequent UTI - most recent infection with proteus sensitive to cipro on review of recent records -reports has azo and tolerates cipro well -denies: fevers, chills, nausea, vomiting, flank pain, hematuria, vaginal discharge  ROS: See pertinent positives and negatives per HPI.  Past Medical History  Diagnosis Date  . ANEMIA, HX OF 12/14/2008  . ANXIETY DISORDER, HX OF 12/14/2008  . ASTHMA 12/14/2008  . DEPRESSION 01/21/2010  . DIABETES MELLITUS, TYPE II 11/12/2006  . DYSPNEA 12/14/2008  . EDEMA, ANKLES 12/14/2008  . FATIGUE 01/04/2007  . FIBROMYALGIA 08/06/2008  . HX OF GALLSTONE 12/14/2008  . HYPERLIPIDEMIA 11/12/2006  . HYPERTENSION 11/12/2006  . OSTEOARTHRITIS 12/05/2006  . PALPITATIONS, HX OF 12/14/2008  . 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 PERIPHERAL NEUROPATHY 08/06/2008  . SINUS TACHYCARDIA,HX OF 12/14/2008  . SLEEP APNEA, OBSTRUCTIVE, MODERATE 08/02/2007  . UTI'S, HX OF 12/14/2008    Past Surgical History  Procedure Laterality Date  . Cholecystectomy    . Abdominal hysterectomy    . Laparoscopic gastric banding    . Wrist surgery      LEFT  . Ptca      Family History  Problem Relation Age of Onset  . Other Mother     ALS  . Stroke Father   . Diabetes Sister     History   Social History  . Marital Status: Married    Spouse Name: N/A    Number of Children: N/A  . Years of Education: N/A   Social History Main Topics  . Smoking status: Never Smoker   . Smokeless tobacco: Never Used  . Alcohol Use: Yes     Comment: glass of wine-specially occasion  . Drug Use: No  . Sexual Activity: None   Other Topics Concern  . None   Social History Narrative  . None    Current outpatient prescriptions:ALPRAZolam (XANAX) 0.25 MG tablet, take 1 tablet by mouth at bedtime if needed, Disp: 60 tablet, Rfl: 2;  aspirin 81 MG  tablet, Take 81 mg by mouth daily.  , Disp: , Rfl: ;  Biotin 5000 MCG TABS, Take by mouth., Disp: , Rfl: ;  calcium-vitamin D 250-100 MG-UNIT per tablet, Take 1 tablet by mouth 2 (two) times daily., Disp: , Rfl:  DULoxetine (CYMBALTA) 60 MG capsule, TAKE 1 CAPSULE BY MOUTH ONCE DAILY, Disp: 30 capsule, Rfl: 3;  FREESTYLE LITE test strip, TEST TWICE A DAY, Disp: 100 each, Rfl: 3;  Insulin Syringe-Needle U-100 (B-D INS SYR MICROFINE 1CC/27G) 27G X 5/8" 1 ML MISC, Use 4 times daily prior to meals and at bedtime, Disp: 120 each, Rfl: 6;  Lancet Device MISC, by Does not apply route daily.  , Disp: , Rfl:  LEVEMIR 100 UNIT/ML injection, INJECT 40 UNITS UNDER THE SKIN AT BEDTIME, Disp: 10 mL, Rfl: 0;  lisinopril-hydrochlorothiazide (PRINZIDE,ZESTORETIC) 20-12.5 MG per tablet, take 1 tablet by mouth once daily, Disp: 90 tablet, Rfl: 3;  metFORMIN (GLUCOPHAGE) 1000 MG tablet, TAKE 1 TABLET BY MOUTH TWICE DAILY WITH FOOD. NEED OFFICE VISIT., Disp: 60 tablet, Rfl: 5 metoprolol tartrate (LOPRESSOR) 25 MG tablet, TAKE 1 TABLET BY MOUTH TWICE DAILY, Disp: 60 tablet, Rfl: 5;  nitroGLYCERIN (NITROSTAT) 0.4 MG SL tablet, Place 0.4 mg under the tongue every 5 (five) minutes as needed., Disp: , Rfl: ;  PROAIR HFA 108 (90 BASE) MCG/ACT inhaler, inhale 2 puffs  by mouth every 6 hours if needed, Disp: 8.5 g, Rfl: 6 ciprofloxacin (CIPRO) 500 MG tablet, Take 1 tablet (500 mg total) by mouth 2 (two) times daily., Disp: 6 tablet, Rfl: 0  EXAM:  Filed Vitals:   09/19/13 1620  BP: 118/76  Pulse: 102  Temp: 98.8 F (37.1 C)    Body mass index is 32.68 kg/(m^2).  GENERAL: vitals reviewed and listed above, alert, oriented, appears well hydrated and in no acute distress  HEENT: atraumatic, conjunttiva clear, no obvious abnormalities on inspection of external nose and ears  NECK: no obvious masses on inspection  LUNGS: clear to auscultation bilaterally, no wheezes, rales or rhonchi, good air movement  CV: HRRR, no  peripheral edema  ABD: BS+, soft, no CVA TTP  MS: moves all extremities without noticeable abnormality  PSYCH: pleasant and cooperative, no obvious depression or anxiety  ASSESSMENT AND PLAN:  Discussed the following assessment and plan:  Dysuria - Plan: POC Urinalysis Dipstick, ciprofloxacin (CIPRO) 500 MG tablet, Urine culture  -Udip c/wUTI - opted for tx with cipro, culture pending -Patient advised to return or notify a doctor immediately if symptoms worsen or persist or new concerns arise.  There are no Patient Instructions on file for this visit.   Colin Benton R.

## 2013-09-22 ENCOUNTER — Telehealth: Payer: Self-pay | Admitting: *Deleted

## 2013-09-22 LAB — URINE CULTURE

## 2013-09-22 MED ORDER — SULFAMETHOXAZOLE-TMP DS 800-160 MG PO TABS: 1.0000 | ORAL_TABLET | Freq: Two times a day (BID) | ORAL | Status: DC

## 2013-09-22 NOTE — Telephone Encounter (Signed)
Patient states Dr Burnice Logan is her PCP but after seeing Dr Maudie Mercury last week she would like to know if Dr Maudie Mercury would take her as a new pt?

## 2013-09-22 NOTE — Telephone Encounter (Signed)
Ok with me, but please do let her know I noticed that she takes xanx and I do not prescribe that medication long term in adult patients. I would be able to refer her to a psychiatrist if she wished to continue that medication.

## 2013-09-22 NOTE — Addendum Note (Signed)
Addended by: Agnes Lawrence on: 09/22/2013 12:54 PM   Modules accepted: Orders

## 2013-09-22 NOTE — Telephone Encounter (Signed)
Patient informed and stated she will think about what to do in regards to an appt with Dr Maudie Mercury and may call back for an appt.  She stated she will contact the physician that treats her fibromyalgia first to see if she will give refills on the Xanax as she takes a small dose at night to help her sleep.

## 2013-09-26 ENCOUNTER — Encounter: Payer: Self-pay | Admitting: Family Medicine

## 2013-09-26 ENCOUNTER — Ambulatory Visit: Payer: 59 | Admitting: Internal Medicine

## 2013-09-26 ENCOUNTER — Ambulatory Visit (INDEPENDENT_AMBULATORY_CARE_PROVIDER_SITE_OTHER): Payer: 59 | Admitting: Family Medicine

## 2013-09-26 VITALS — BP 102/78 | HR 58 | Temp 98.1°F | Ht 64.0 in | Wt 190.0 lb

## 2013-09-26 DIAGNOSIS — E785 Hyperlipidemia, unspecified: Secondary | ICD-10-CM

## 2013-09-26 DIAGNOSIS — IMO0001 Reserved for inherently not codable concepts without codable children: Secondary | ICD-10-CM

## 2013-09-26 DIAGNOSIS — E119 Type 2 diabetes mellitus without complications: Secondary | ICD-10-CM

## 2013-09-26 DIAGNOSIS — I10 Essential (primary) hypertension: Secondary | ICD-10-CM

## 2013-09-26 DIAGNOSIS — F329 Major depressive disorder, single episode, unspecified: Secondary | ICD-10-CM

## 2013-09-26 DIAGNOSIS — F3289 Other specified depressive episodes: Secondary | ICD-10-CM

## 2013-09-26 LAB — LIPID PANEL
Cholesterol: 261 mg/dL — ABNORMAL HIGH (ref 0–200)
HDL: 57.7 mg/dL (ref >39.00)
LDL CALC: 175 mg/dL — AB (ref 0–99)
NonHDL: 203.3
TRIGLYCERIDES: 144 mg/dL (ref 0.0–149.0)
Total CHOL/HDL Ratio: 5
VLDL: 28.8 mg/dL (ref 0.0–40.0)

## 2013-09-26 LAB — BASIC METABOLIC PANEL
BUN: 18 mg/dL (ref 6–23)
CALCIUM: 9.5 mg/dL (ref 8.4–10.5)
CHLORIDE: 101 meq/L (ref 96–112)
CO2: 26 mEq/L (ref 19–32)
Creatinine, Ser: 1 mg/dL (ref 0.4–1.2)
GFR: 56.68 mL/min — ABNORMAL LOW (ref >60.00)
Glucose, Bld: 96 mg/dL (ref 70–99)
POTASSIUM: 5.2 meq/L — AB (ref 3.5–5.1)
SODIUM: 136 meq/L (ref 135–145)

## 2013-09-26 LAB — TSH: TSH: 0.86 u[IU]/mL (ref 0.35–4.50)

## 2013-09-26 LAB — HEMOGLOBIN A1C: HEMOGLOBIN A1C: 7.6 % — AB (ref 4.6–6.5)

## 2013-09-26 LAB — MICROALBUMIN / CREATININE URINE RATIO
CREATININE, U: 90.1 mg/dL
Microalb Creat Ratio: 0.2 mg/g (ref 0.0–30.0)
Microalb, Ur: 0.2 mg/dL (ref 0.0–1.9)

## 2013-09-26 LAB — T4, FREE: Free T4: 0.8 ng/dL (ref 0.60–1.60)

## 2013-09-26 MED ORDER — LEVEMIR 100 UNIT/ML ~~LOC~~ SOLN: SUBCUTANEOUS | Status: DC

## 2013-09-26 NOTE — Progress Notes (Signed)
No chief complaint on file.   HPI:  Acute visit for:  DM: -reports on 40 units of levimir nightly and metformin 1000mg  bid, asa -reports used to take mealtime insulin but made bs too low -BS: fasting is usally 80-130, postprandial usually 250 -last eye exam  -denies: polyuria, poly dipsia, foot lesions, vision changes -reports needs refill on levimir as about to run out  HTN: -metoprolol 25 twice dialy, lisinopril hctz -stable  HLD - hx of: -resolved after gastric bypass  Fibro and anxiety depression: -seeing rheum for this  ROS: See pertinent positives and negatives per HPI.  Past Medical History  Diagnosis Date  . ANEMIA, HX OF 12/14/2008  . ANXIETY DISORDER, HX OF 12/14/2008  . ASTHMA 12/14/2008  . DEPRESSION 01/21/2010  . DIABETES MELLITUS, TYPE II 11/12/2006  . DYSPNEA 12/14/2008  . EDEMA, ANKLES 12/14/2008  . FATIGUE 01/04/2007  . FIBROMYALGIA 08/06/2008  . HX OF GALLSTONE 12/14/2008  . HYPERLIPIDEMIA 11/12/2006  . HYPERTENSION 11/12/2006  . OSTEOARTHRITIS 12/05/2006  . PALPITATIONS, HX OF 12/14/2008  . 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 PERIPHERAL NEUROPATHY 08/06/2008  . SINUS TACHYCARDIA,HX OF 12/14/2008  . SLEEP APNEA, OBSTRUCTIVE, MODERATE 08/02/2007  . UTI'S, HX OF 12/14/2008    Past Surgical History  Procedure Laterality Date  . Cholecystectomy    . Abdominal hysterectomy    . Laparoscopic gastric banding    . Wrist surgery      LEFT  . Ptca      Family History  Problem Relation Age of Onset  . Other Mother     ALS  . Stroke Father   . Diabetes Sister     History   Social History  . Marital Status: Married    Spouse Name: N/A    Number of Children: N/A  . Years of Education: N/A   Social History Main Topics  . Smoking status: Never Smoker   . Smokeless tobacco: Never Used  . Alcohol Use: Yes     Comment: glass of wine-specially occasion  . Drug Use: No  . Sexual Activity: None   Other Topics Concern  . None    Social History Narrative  . None    Current outpatient prescriptions:ALPRAZolam (XANAX) 0.25 MG tablet, take 1 tablet by mouth at bedtime if needed, Disp: 60 tablet, Rfl: 2;  aspirin 81 MG tablet, Take 81 mg by mouth daily.  , Disp: , Rfl: ;  Biotin 5000 MCG TABS, Take by mouth., Disp: , Rfl: ;  calcium-vitamin D 250-100 MG-UNIT per tablet, Take 1 tablet by mouth 2 (two) times daily., Disp: , Rfl:  ciprofloxacin (CIPRO) 500 MG tablet, Take 1 tablet (500 mg total) by mouth 2 (two) times daily., Disp: 6 tablet, Rfl: 0;  DULoxetine (CYMBALTA) 60 MG capsule, TAKE 1 CAPSULE BY MOUTH ONCE DAILY, Disp: 30 capsule, Rfl: 3;  FREESTYLE LITE test strip, TEST TWICE A DAY, Disp: 100 each, Rfl: 3;  Insulin Syringe-Needle U-100 (B-D INS SYR MICROFINE 1CC/27G) 27G X 5/8" 1 ML MISC, Use 4 times daily prior to meals and at bedtime, Disp: 120 each, Rfl: 6 Lancet Device MISC, by Does not apply route daily.  , Disp: , Rfl: ;  LEVEMIR 100 UNIT/ML injection, INJECT 40 UNITS UNDER THE SKIN AT BEDTIME, Disp: 10 mL, Rfl: 5;  lisinopril-hydrochlorothiazide (PRINZIDE,ZESTORETIC) 20-12.5 MG per tablet, take 1 tablet by mouth once daily, Disp: 90 tablet, Rfl: 3;  metFORMIN (GLUCOPHAGE) 1000 MG tablet, TAKE 1 TABLET BY MOUTH TWICE DAILY WITH  FOOD. NEED OFFICE VISIT., Disp: 60 tablet, Rfl: 5 metoprolol tartrate (LOPRESSOR) 25 MG tablet, TAKE 1 TABLET BY MOUTH TWICE DAILY, Disp: 60 tablet, Rfl: 5;  nitroGLYCERIN (NITROSTAT) 0.4 MG SL tablet, Place 0.4 mg under the tongue every 5 (five) minutes as needed., Disp: , Rfl: ;  PROAIR HFA 108 (90 BASE) MCG/ACT inhaler, inhale 2 puffs by mouth every 6 hours if needed, Disp: 8.5 g, Rfl: 6 sulfamethoxazole-trimethoprim (BACTRIM DS) 800-160 MG per tablet, Take 1 tablet by mouth 2 (two) times daily., Disp: 10 tablet, Rfl: 0  EXAM:  Filed Vitals:   09/26/13 1022  BP: 102/78  Pulse: 58  Temp: 98.1 F (36.7 C)    Body mass index is 32.6 kg/(m^2).  GENERAL: vitals reviewed and listed  above, alert, oriented, appears well hydrated and in no acute distress  HEENT: atraumatic, conjunttiva clear, no obvious abnormalities on inspection of external nose and ears  NECK: no obvious masses on inspection  LUNGS: clear to auscultation bilaterally, no wheezes, rales or rhonchi, good air movement  CV: HRRR, no peripheral edema  MS: moves all extremities without noticeable abnormality  PSYCH: pleasant and cooperative, no obvious depression or anxiety  ASSESSMENT AND PLAN:  Discussed the following assessment and plan:  DIABETES MELLITUS, TYPE II - Plan: Hemoglobin J4G, Basic metabolic panel, Microalbumin/Creatinine Ratio, Urine, LEVEMIR 100 UNIT/ML injection  HYPERLIPIDEMIA - Plan: Lipid Panel  DEPRESSION - Plan: TSH, T4, Free  HYPERTENSION - Plan: Basic metabolic panel  FIBROMYALGIA - Plan: TSH, T4, Free  -she wants labs today and then is going to follow up for new patient visit -discussed that he insulin regimen may be better split between mealtime and basal insulin -checking labs then will follow up for new pt visit and to change medications if needed -Patient advised to return or notify a doctor immediately if symptoms worsen or persist or new concerns arise.  Patient Instructions  -We have ordered labs or studies at this visit. It can take up to 1-2 weeks for results and processing. We will contact you with instructions IF your results are abnormal. Normal results will be released to your [facility]. If you have not heard from [place] or can not find your results in [facility] in 2 weeks please contact our office.  -schedule new patient visit with me         Colin Benton R.

## 2013-09-26 NOTE — Progress Notes (Signed)
Pre visit review using our clinic review tool, if applicable. No additional management support is needed unless otherwise documented below in the visit note. 

## 2013-09-26 NOTE — Patient Instructions (Signed)
-  We have ordered labs or studies at this visit. It can take up to 1-2 weeks for results and processing. We will contact you with instructions IF your results are abnormal. Normal results will be released to your [facility]. If you have not heard from [place] or can not find your results in [facility] in 2 weeks please contact our office.  -schedule new patient visit with me

## 2013-10-06 ENCOUNTER — Other Ambulatory Visit: Payer: Self-pay | Admitting: Internal Medicine

## 2013-10-31 ENCOUNTER — Ambulatory Visit (INDEPENDENT_AMBULATORY_CARE_PROVIDER_SITE_OTHER): Payer: 59 | Admitting: Family Medicine

## 2013-10-31 ENCOUNTER — Encounter: Payer: Self-pay | Admitting: Family Medicine

## 2013-10-31 VITALS — BP 102/80 | HR 65 | Temp 97.9°F | Ht 64.0 in | Wt 188.0 lb

## 2013-10-31 DIAGNOSIS — IMO0002 Reserved for concepts with insufficient information to code with codable children: Secondary | ICD-10-CM

## 2013-10-31 DIAGNOSIS — E1149 Type 2 diabetes mellitus with other diabetic neurological complication: Secondary | ICD-10-CM

## 2013-10-31 DIAGNOSIS — E1165 Type 2 diabetes mellitus with hyperglycemia: Secondary | ICD-10-CM

## 2013-10-31 DIAGNOSIS — F329 Major depressive disorder, single episode, unspecified: Secondary | ICD-10-CM

## 2013-10-31 DIAGNOSIS — I10 Essential (primary) hypertension: Secondary | ICD-10-CM

## 2013-10-31 DIAGNOSIS — F3289 Other specified depressive episodes: Secondary | ICD-10-CM

## 2013-10-31 DIAGNOSIS — E785 Hyperlipidemia, unspecified: Secondary | ICD-10-CM

## 2013-10-31 DIAGNOSIS — Z23 Encounter for immunization: Secondary | ICD-10-CM

## 2013-10-31 DIAGNOSIS — E119 Type 2 diabetes mellitus without complications: Secondary | ICD-10-CM

## 2013-10-31 MED ORDER — PRAVASTATIN SODIUM 40 MG PO TABS: 40.0000 mg | ORAL_TABLET | Freq: Every day | ORAL | Status: DC

## 2013-10-31 MED ORDER — INSULIN PEN NEEDLE 32G X 6 MM MISC: Status: DC

## 2013-10-31 MED ORDER — INSULIN DETEMIR 100 UNIT/ML FLEXPEN: PEN_INJECTOR | SUBCUTANEOUS | Status: DC

## 2013-10-31 MED ORDER — INSULIN ASPART 100 UNIT/ML FLEXPEN: 5.0000 [IU] | PEN_INJECTOR | Freq: Three times a day (TID) | SUBCUTANEOUS | Status: DC

## 2013-10-31 NOTE — Patient Instructions (Signed)
FOR YOUR DIABETES:  []  Eat a healthy low carb diet (avoid sweets, sweet drinks, breads, potatoes, rice, etc.) and ensure 3 small meals daily.  []  Get AT LEAST 150 minutes of cardiovascular exercise per week - 30 minutes per day is best of sustained sweaty exercise.  []  Take all of your medications every day as directed by your doctor. Call your doctor immediately if you have any questions about your medications or are running low.  []  Check your blood sugar often and when you feel unwell and keep a log to bring to all health appointments. FASTING: before you eat anything in the morning POSTPRANDIAL: 1-2 hours after a meal  []  If any low blood sugars < 70, eat a snack and call your doctor immediately.  []  See an eye doctor every year and fax your diabetic eye exam to our office.  Fax: [phone]  []  Take good care of your feet and keep them soft and callus free. Check your feet daily and wear comfortable shoes. See your doctor immediately if you have any cuts, calluses or wounds on your feet.  Start cholesterol medications

## 2013-10-31 NOTE — Addendum Note (Signed)
Addended by: Agnes Lawrence on: 10/31/2013 10:39 AM   Modules accepted: Orders

## 2013-10-31 NOTE — Progress Notes (Signed)
No chief complaint on file.   HPI:  EUFEMIA PRINDLE is here to establish care. Transferring from Dr. Burnice Logan. Last PCP and physical:   Has the following chronic problems and concerns today:  Patient Active Problem List   Diagnosis Date Noted  . DEPRESSION 01/21/2010  . SINUS TACHYCARDIA,HX OF 12/14/2008  . ASTHMA 12/14/2008  . EDEMA, ANKLES 12/14/2008  . DYSPNEA 12/14/2008  . ANXIETY DISORDER, HX OF 12/14/2008  . ANEMIA, HX OF 12/14/2008  . PALPITATIONS, HX OF 12/14/2008  . HX OF GALLSTONE 12/14/2008  . UTI'S, HX OF 12/14/2008  . PERIPHERAL NEUROPATHY 08/06/2008  . FIBROMYALGIA 08/06/2008  . SLEEP APNEA, OBSTRUCTIVE, MODERATE 08/02/2007  . FATIGUE 01/04/2007  . OSTEOARTHRITIS 12/05/2006  . DIABETES MELLITUS, TYPE II 11/12/2006  . HYPERLIPIDEMIA 11/12/2006  . HYPERTENSION 11/12/2006  . PERCUTANEOUS TRANSLUMINAL CORONARY ANGIOPLASTY, HX OF 10/02/2002    DM with peripheral neuropathy: -meds: metformin 1000mg  bid, levemir 40 units daily, asa, lisinopril 20 -home BS: fasting around 100, after meals 160 or higher -diet and exercise: no regular CV exercise; she is trying to watch carbs but is not great about this -last eye exam: 11/2013 -last foot exam: does not remember -denies: polyuria, polydipsia, vision changes, lesions on feet Lab Results  Component Value Date   HGBA1C 7.6* 09/26/2013   Hyperlypidemia, goal LDL <100: -meds: none, she thinks maybe took a statin a long time ago -no leg cramps Lab Results  Component Value Date   CHOL 261* 09/26/2013   HDL 57.70 09/26/2013   LDLCALC 175* 09/26/2013   TRIG 144.0 09/26/2013   CHOLHDL 5 09/26/2013    HTN: -meds: asa, lisinopril-hctz (20-12.5), metoporlol 25 -denies: CP, SOB, palpitations, swelling, HA  Depression and Anxiety, fibromyalgia: -meds: duloxetine -counseling: none -exercise: none -support: good -denies: SI, thoughts of self harm, prior hospitalization for this  Hx CP: -rest stress myoview in  2014 ROS negative for unless reported above: fevers, unintentional weight loss, hearing or vision loss, chest pain, palpitations, struggling to breath, hemoptysis, melena, hematochezia, hematuria, falls, loc, si, thoughts of self harm  Past Medical History  Diagnosis Date  . ANEMIA, HX OF 12/14/2008  . ANXIETY DISORDER, HX OF 12/14/2008  . ASTHMA 12/14/2008  . DEPRESSION 01/21/2010  . DIABETES MELLITUS, TYPE II 11/12/2006  . DYSPNEA 12/14/2008  . EDEMA, ANKLES 12/14/2008  . FATIGUE 01/04/2007  . FIBROMYALGIA 08/06/2008  . HX OF GALLSTONE 12/14/2008  . HYPERLIPIDEMIA 11/12/2006  . HYPERTENSION 11/12/2006  . OSTEOARTHRITIS 12/05/2006  . PALPITATIONS, HX OF 12/14/2008  . 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 PERIPHERAL NEUROPATHY 08/06/2008  . SINUS TACHYCARDIA,HX OF 12/14/2008  . SLEEP APNEA, OBSTRUCTIVE, MODERATE 08/02/2007  . UTI'S, HX OF 12/14/2008    Family History  Problem Relation Age of Onset  . Other Mother     ALS  . Stroke Father   . Diabetes Sister     History   Social History  . Marital Status: Married    Spouse Name: N/A    Number of Children: N/A  . Years of Education: N/A   Social History Main Topics  . Smoking status: Never Smoker   . Smokeless tobacco: Never Used  . Alcohol Use: Yes     Comment: glass of wine-specially occasion  . Drug Use: No  . Sexual Activity: None   Other Topics Concern  . None   Social History Narrative  . None    Current outpatient prescriptions:ALPRAZolam (XANAX) 0.25 MG tablet, take 1 tablet by mouth at  bedtime if needed, Disp: 60 tablet, Rfl: 2;  aspirin 81 MG tablet, Take 81 mg by mouth daily.  , Disp: , Rfl: ;  Biotin 5000 MCG TABS, Take by mouth., Disp: , Rfl: ;  calcium-vitamin D 250-100 MG-UNIT per tablet, Take 1 tablet by mouth 2 (two) times daily., Disp: , Rfl:  DULoxetine (CYMBALTA) 60 MG capsule, TAKE 1 CAPSULE BY MOUTH ONCE DAILY, Disp: 30 capsule, Rfl: 5;  FREESTYLE LITE test strip, TEST TWICE A DAY, Disp:  100 each, Rfl: 3;  Insulin Syringe-Needle U-100 (B-D INS SYR MICROFINE 1CC/27G) 27G X 5/8" 1 ML MISC, Use 4 times daily prior to meals and at bedtime, Disp: 120 each, Rfl: 6;  Lancet Device MISC, by Does not apply route daily.  , Disp: , Rfl:  lisinopril-hydrochlorothiazide (PRINZIDE,ZESTORETIC) 20-12.5 MG per tablet, take 1 tablet by mouth once daily, Disp: 90 tablet, Rfl: 3;  metFORMIN (GLUCOPHAGE) 1000 MG tablet, TAKE 1 TABLET BY MOUTH TWICE DAILY WITH FOOD. NEED OFFICE VISIT., Disp: 60 tablet, Rfl: 5;  metoprolol tartrate (LOPRESSOR) 25 MG tablet, TAKE 1 TABLET BY MOUTH TWICE DAILY, Disp: 60 tablet, Rfl: 5 nitroGLYCERIN (NITROSTAT) 0.4 MG SL tablet, Place 0.4 mg under the tongue every 5 (five) minutes as needed., Disp: , Rfl: ;  PROAIR HFA 108 (90 BASE) MCG/ACT inhaler, inhale 2 puffs by mouth every 6 hours if needed, Disp: 8.5 g, Rfl: 6;  insulin aspart (NOVOLOG FLEXPEN) 100 UNIT/ML FlexPen, Inject 5 Units into the skin 3 (three) times daily with meals., Disp: 15 mL, Rfl: 11 Insulin Detemir (LEVEMIR) 100 UNIT/ML Pen, 25 units every morning - titrate by 3 units every 3 days to get average fasting morning blood sugar under 130, Disp: 1 pen, Rfl: 11;  Insulin Pen Needle (NOVOFINE) 32G X 6 MM MISC, Use with levemir pen as instructed, Disp: 100 each, Rfl: 3;  pravastatin (PRAVACHOL) 40 MG tablet, Take 1 tablet (40 mg total) by mouth daily., Disp: 90 tablet, Rfl: 3  EXAM:  Filed Vitals:   10/31/13 0939  BP: 102/80  Pulse: 65  Temp: 97.9 F (36.6 C)    Body mass index is 32.25 kg/(m^2).  GENERAL: vitals reviewed and listed above, alert, oriented, appears well hydrated and in no acute distress  HEENT: atraumatic, conjunttiva clear, no obvious abnormalities on inspection of external nose and ears  NECK: no obvious masses on inspection  LUNGS: clear to auscultation bilaterally, no wheezes, rales or rhonchi, good air movement  CV: HRRR, no peripheral edema  MS: moves all extremities without  noticeable abnormality  PSYCH: pleasant and cooperative, no obvious depression or anxiety  ASSESSMENT AND PLAN:  Discussed the following assessment and plan:  HYPERLIPIDEMIA - Plan: pravastatin (PRAVACHOL) 40 MG tablet  DIABETES MELLITUS, TYPE II - Plan: Insulin Detemir (LEVEMIR) 100 UNIT/ML Pen, insulin aspart (NOVOLOG FLEXPEN) 100 UNIT/ML FlexPen, Insulin Pen Needle (NOVOFINE) 32G X 6 MM MISC  DEPRESSION  HYPERTENSION  Diabetes mellitus with neurological manifestations, uncontrolled  -We reviewed the PMH, PSH, FH, SH, Meds and Allergies. -We provided refills for any medications we will prescribe as needed. -We addressed current concerns per orders and patient instructions. -We have asked for records for pertinent exams, studies, vaccines and notes from previous providers. -We have advised patient to follow up per instructions below. -change to mealtime and fasting inuslin dosing - discussed, she declined diabetes education - discussed dosing, safety, risks -starting statin for cholesterol - risks discussed -pneumococcal vaccine -foot exam done -follow up in 2-3 months  -Patient advised  to return or notify a doctor immediately if symptoms worsen or persist or new concerns arise.  Patient Instructions  FOR YOUR DIABETES:  []  Eat a healthy low carb diet (avoid sweets, sweet drinks, breads, potatoes, rice, etc.) and ensure 3 small meals daily.  []  Get AT LEAST 150 minutes of cardiovascular exercise per week - 30 minutes per day is best of sustained sweaty exercise.  []  Take all of your medications every day as directed by your doctor. Call your doctor immediately if you have any questions about your medications or are running low.  []  Check your blood sugar often and when you feel unwell and keep a log to bring to all health appointments. FASTING: before you eat anything in the morning POSTPRANDIAL: 1-2 hours after a meal  []  If any low blood sugars < 70, eat a snack and  call your doctor immediately.  []  See an eye doctor every year and fax your diabetic eye exam to our office.  Fax: [phone]  []  Take good care of your feet and keep them soft and callus free. Check your feet daily and wear comfortable shoes. See your doctor immediately if you have any cuts, calluses or wounds on your feet.  Start cholesterol medications      KIM, HANNAH R.

## 2013-10-31 NOTE — Progress Notes (Signed)
Pre visit review using our clinic review tool, if applicable. No additional management support is needed unless otherwise documented below in the visit note. 

## 2013-11-13 ENCOUNTER — Encounter: Payer: Self-pay | Admitting: Internal Medicine

## 2013-12-25 LAB — HM DIABETES EYE EXAM

## 2014-01-01 ENCOUNTER — Encounter: Payer: Self-pay | Admitting: Family Medicine

## 2014-01-23 ENCOUNTER — Other Ambulatory Visit: Payer: Self-pay | Admitting: Internal Medicine

## 2014-02-05 ENCOUNTER — Encounter (INDEPENDENT_AMBULATORY_CARE_PROVIDER_SITE_OTHER): Payer: Commercial Managed Care - PPO

## 2014-02-20 ENCOUNTER — Ambulatory Visit (INDEPENDENT_AMBULATORY_CARE_PROVIDER_SITE_OTHER): Payer: 59 | Admitting: Family Medicine

## 2014-02-20 ENCOUNTER — Encounter: Payer: Self-pay | Admitting: Family Medicine

## 2014-02-20 VITALS — BP 104/80 | HR 83 | Temp 98.1°F | Ht 64.0 in | Wt 186.6 lb

## 2014-02-20 DIAGNOSIS — L4050 Arthropathic psoriasis, unspecified: Secondary | ICD-10-CM | POA: Insufficient documentation

## 2014-02-20 DIAGNOSIS — I10 Essential (primary) hypertension: Secondary | ICD-10-CM

## 2014-02-20 DIAGNOSIS — E785 Hyperlipidemia, unspecified: Secondary | ICD-10-CM

## 2014-02-20 DIAGNOSIS — E1165 Type 2 diabetes mellitus with hyperglycemia: Secondary | ICD-10-CM

## 2014-02-20 DIAGNOSIS — E1139 Type 2 diabetes mellitus with other diabetic ophthalmic complication: Secondary | ICD-10-CM

## 2014-02-20 DIAGNOSIS — IMO0002 Reserved for concepts with insufficient information to code with codable children: Secondary | ICD-10-CM

## 2014-02-20 NOTE — Patient Instructions (Addendum)
Titrate up a little on levemir if needed (fasting blood sugar > 130 on average) can tritate up by 2-3 units ever 3-5 days). Call immediately if having any low blood sugars.  Follow up in 2 months - morning appointment, come fasting but drink plenty of water. Will check labs then and decide the next step for the diabetes.

## 2014-02-20 NOTE — Progress Notes (Signed)
HPI:  DM with peripheral neuropathy: -meds: metformin 1000mg  bid, levemir 25 units tritrating up after last visit in the morning, novolog 10 units with meals, asa, lisinopril 20, asa -home BS: fasting around 100s usually - up in 200-300s when on prednisone, after meals 160 or higher -diet and exercise: no regular CV exercise; she is trying to watch carbs but is not great about this -last eye exam: 12/2013, reports does have diabetic eye damage -last foot exam: 10/2013 -denies: polyuria, polydipsia, vision changes, lesions on feet Lab Results  Component Value Date   HGBA1C 7.6* 09/26/2013  -has been on multiple courses of steroids for her psoriatic arthritis and bronchitis  Hyperlypidemia, goal LDL <100: -meds: none, she thinks maybe took a statin a long time ago -restarted statin 10/2013 -no leg cramps or cog impairment Lab Results  Component Value Date   CHOL 261* 09/26/2013   HDL 57.70 09/26/2013   LDLCALC 175* 09/26/2013   TRIG 144.0 09/26/2013   CHOLHDL 5 09/26/2013    HTN: -meds: asa, lisinopril-hctz (20-12.5), metoporlol 25 -denies: CP, SOB, palpitations, swelling, HA  Depression and Anxiety, fibromyalgia: -meds: duloxetine, xanax for sleep prescribed by rheumatologist -counseling: none -exercise: none -support: good -denies: SI, thoughts of self harm, prior hospitalization for this  Hx CP/tachycardia/CAD: -rest stress myoview in 2014 -asa daily, statin, bb, acei    ROS: See pertinent positives and negatives per HPI.  Past Medical History  Diagnosis Date  . ANEMIA, HX OF 12/14/2008  . ANXIETY DISORDER, HX OF 12/14/2008  . ASTHMA 12/14/2008  . DEPRESSION 01/21/2010  . DIABETES MELLITUS, TYPE II 11/12/2006  . DYSPNEA 12/14/2008  . EDEMA, ANKLES 12/14/2008  . FATIGUE 01/04/2007  . FIBROMYALGIA 08/06/2008  . HX OF GALLSTONE 12/14/2008  . HYPERLIPIDEMIA 11/12/2006  . HYPERTENSION 11/12/2006  . OSTEOARTHRITIS 12/05/2006  . PALPITATIONS, HX OF 12/14/2008   . 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 PERIPHERAL NEUROPATHY 08/06/2008  . SINUS TACHYCARDIA,HX OF 12/14/2008  . SLEEP APNEA, OBSTRUCTIVE, MODERATE 08/02/2007  . UTI'S, HX OF 12/14/2008    Past Surgical History  Procedure Laterality Date  . Cholecystectomy    . Abdominal hysterectomy    . Laparoscopic gastric banding    . Wrist surgery      LEFT  . Ptca      Family History  Problem Relation Age of Onset  . Other Mother     ALS  . Stroke Father   . Diabetes Sister     History   Social History  . Marital Status: Married    Spouse Name: N/A    Number of Children: N/A  . Years of Education: N/A   Social History Main Topics  . Smoking status: Never Smoker   . Smokeless tobacco: Never Used  . Alcohol Use: Yes     Comment: glass of wine-specially occasion  . Drug Use: No  . Sexual Activity: None   Other Topics Concern  . None   Social History Narrative    Current outpatient prescriptions: ALPRAZolam (XANAX) 0.25 MG tablet, take 1 tablet by mouth at bedtime if needed, Disp: 60 tablet, Rfl: 2;  aspirin 81 MG tablet, Take 81 mg by mouth daily.  , Disp: , Rfl: ;  Biotin 5000 MCG TABS, Take by mouth., Disp: , Rfl: ;  calcium-vitamin D 250-100 MG-UNIT per tablet, Take 1 tablet by mouth 2 (two) times daily., Disp: , Rfl:  DULoxetine (CYMBALTA) 60 MG capsule, TAKE 1 CAPSULE BY MOUTH ONCE DAILY, Disp: 30 capsule,  Rfl: 5;  folic acid (FOLVITE) 1 MG tablet, Take 1 mg by mouth daily., Disp: , Rfl: ;  FREESTYLE LITE test strip, TEST TWICE A DAY, Disp: 100 each, Rfl: 3;  insulin aspart (NOVOLOG FLEXPEN) 100 UNIT/ML FlexPen, Inject 5 Units into the skin 3 (three) times daily with meals., Disp: 15 mL, Rfl: 11 Insulin Detemir (LEVEMIR) 100 UNIT/ML Pen, 25 units every morning - titrate by 3 units every 3 days to get average fasting morning blood sugar under 130, Disp: 1 pen, Rfl: 11;  Insulin Pen Needle (NOVOFINE) 32G X 6 MM MISC, Use with levemir pen as instructed,  Disp: 100 each, Rfl: 3;  Insulin Syringe-Needle U-100 (B-D INS SYR MICROFINE 1CC/27G) 27G X 5/8" 1 ML MISC, Use 4 times daily prior to meals and at bedtime, Disp: 120 each, Rfl: 6 Lancet Device MISC, by Does not apply route daily.  , Disp: , Rfl: ;  lisinopril-hydrochlorothiazide (PRINZIDE,ZESTORETIC) 20-12.5 MG per tablet, take 1 tablet by mouth once daily, Disp: 90 tablet, Rfl: 3;  metFORMIN (GLUCOPHAGE) 1000 MG tablet, TAKE 1 TABLET BY MOUTH TWICE A DAY WITH FOOD. NEED OFFICE VISIT., Disp: 60 tablet, Rfl: 5;  metoprolol tartrate (LOPRESSOR) 25 MG tablet, TAKE 1 TABLET BY MOUTH TWICE DAILY, Disp: 60 tablet, Rfl: 5 nitroGLYCERIN (NITROSTAT) 0.4 MG SL tablet, Place 0.4 mg under the tongue every 5 (five) minutes as needed., Disp: , Rfl: ;  pravastatin (PRAVACHOL) 40 MG tablet, Take 1 tablet (40 mg total) by mouth daily., Disp: 90 tablet, Rfl: 3;  PRESCRIPTION MEDICATION, Ortrexa injection-weekly for psoriatic arthritis, Disp: , Rfl: ;  PROAIR HFA 108 (90 BASE) MCG/ACT inhaler, inhale 2 puffs by mouth every 6 hours if needed, Disp: 8.5 g, Rfl: 6  EXAM:  Filed Vitals:   02/20/14 1314  BP: 104/80  Pulse: 83  Temp: 98.1 F (36.7 C)    Body mass index is 32.01 kg/(m^2).  GENERAL: vitals reviewed and listed above, alert, oriented, appears well hydrated and in no acute distress  HEENT: atraumatic, conjunttiva clear, no obvious abnormalities on inspection of external nose and ears  NECK: no obvious masses on inspection  LUNGS: clear to auscultation bilaterally, no wheezes, rales or rhonchi, good air movement  CV: HRRR, no peripheral edema  MS: moves all extremities without noticeable abnormality  PSYCH: pleasant and cooperative, no obvious depression or anxiety  ASSESSMENT AND PLAN:  Discussed the following assessment and plan:  DM (diabetes mellitus), type 2, uncontrolled w/ophthalmic complication  Hyperlipemia  Essential hypertension  Psoriatic arthritis - sees  rheumatologist  -lifestyle recs, she does not want to check a hgba1c after just taking steroids - wants to follow up in 2 months and check, titrate up on levemir as needed, considering endocrine consult if not at goal  -Patient advised to return or notify a doctor immediately if symptoms worsen or persist or new concerns arise.  Patient Instructions  Titrate up a little on levemir if needed (fasting blood sugar > 130 on average) can tritate up by 2-3 units ever 3-5 days). Call immediately if having any low blood sugars.  Follow up in 2 months - morning appointment, come fasting but drink plenty of water. Will check labs then and decide the next step for the diabetes.     Colin Benton R.

## 2014-02-20 NOTE — Progress Notes (Signed)
Pre visit review using our clinic review tool, if applicable. No additional management support is needed unless otherwise documented below in the visit note. 

## 2014-02-26 ENCOUNTER — Telehealth: Payer: Self-pay | Admitting: Family Medicine

## 2014-02-26 NOTE — Telephone Encounter (Signed)
emmi emailed °

## 2014-03-06 ENCOUNTER — Other Ambulatory Visit: Payer: Self-pay | Admitting: Internal Medicine

## 2014-03-10 NOTE — Telephone Encounter (Signed)
I do not prescribe xanax. I believe she told me that her rheumatologist prescribes this? Needs appt if is having anxiety or sleep issues and wants [place] to assist - however I do not rx xanx long term - can sometimes refill short term if needed or to taper off.

## 2014-03-11 NOTE — Telephone Encounter (Signed)
Patient states her rheumatologist does prescribe this and she does not know why the pharmacist sent the request to our office.  I advised she contact her pharmacy to have them send the request to the correct doctor and she agreed.

## 2014-03-25 ENCOUNTER — Other Ambulatory Visit: Payer: Self-pay | Admitting: Internal Medicine

## 2014-04-17 ENCOUNTER — Other Ambulatory Visit: Payer: Self-pay | Admitting: Internal Medicine

## 2014-04-17 NOTE — Telephone Encounter (Signed)
Needs follow up appointment with me. Ok to refill for 30 days with one refill in interim.

## 2014-04-24 ENCOUNTER — Ambulatory Visit: Payer: 59 | Admitting: Family Medicine

## 2014-04-29 ENCOUNTER — Other Ambulatory Visit: Payer: Self-pay

## 2014-05-20 ENCOUNTER — Other Ambulatory Visit: Payer: Self-pay | Admitting: Family Medicine

## 2014-05-28 ENCOUNTER — Ambulatory Visit (INDEPENDENT_AMBULATORY_CARE_PROVIDER_SITE_OTHER): Payer: 59 | Admitting: Family Medicine

## 2014-05-28 ENCOUNTER — Encounter: Payer: Self-pay | Admitting: Family Medicine

## 2014-05-28 VITALS — BP 100/62 | HR 80 | Temp 97.9°F | Ht 64.0 in | Wt 180.0 lb

## 2014-05-28 DIAGNOSIS — J069 Acute upper respiratory infection, unspecified: Secondary | ICD-10-CM

## 2014-05-28 DIAGNOSIS — E1140 Type 2 diabetes mellitus with diabetic neuropathy, unspecified: Secondary | ICD-10-CM

## 2014-05-28 DIAGNOSIS — L4050 Arthropathic psoriasis, unspecified: Secondary | ICD-10-CM

## 2014-05-28 DIAGNOSIS — E785 Hyperlipidemia, unspecified: Secondary | ICD-10-CM

## 2014-05-28 DIAGNOSIS — I10 Essential (primary) hypertension: Secondary | ICD-10-CM

## 2014-05-28 MED ORDER — AZITHROMYCIN 250 MG PO TABS: ORAL_TABLET | ORAL | Status: DC

## 2014-05-28 NOTE — Patient Instructions (Signed)
BEFORE yOU LEAVE: -schedule fasting lab appt in next 1 week - drink plenty of water day of appt -schedule 3 month f/u  AFRIN for 3 days twice dialy then STOP  flonase 2 sprays each nostril for 21 days  Antibiotic if worsens, persists    follow up if worsening, new symptoms, persists

## 2014-05-28 NOTE — Progress Notes (Signed)
Pre visit review using our clinic review tool, if applicable. No additional management support is needed unless otherwise documented below in the visit note. 

## 2014-05-28 NOTE — Progress Notes (Signed)
HPI:  Priscilla [name] is a 64 yo F with a complicated PMH sig for Anxiety, depression, fibromyalgia, psoriatic arthritis (managed by her rheumatolgist), asthma, DM, HTN, HLD whom did not follow up as advised at her last appointment, here for an acute visit today:  Acute for:  URI: -started about 4-5 days ago -symptoms: nasal congestion, low grade temp initially, sore throat, a little wheezing, cough, some wheezing -denies: fevers> 100, SOB, sinus pain, tooth pain, ear pain -denies flu exposure, strep throat, tick bite, travel -on immunosuppressive therapy for her arthritis, has needed inhaler a few times  DM, HTN, HLD, Hx CAD, sinus tach and palpitations: -diabetic complications: neuropathy -non-compliant with last treatment and followup recs: -meds:asa, levemir  units daily, novolog 5 units with meals, metformin 1000mg  bid, lisinopril-hctz 20-12.5, metoprolol 25 bid, pravastatin 40mg  daily -denies: hypoglycemia, wounds, vision changes, CP, SOB, swelling  Depression/Anxiety: -on duloxetine and xanax - rxd by rheum -denies: SI  Fibro/psoriatic arthritis: -managed by rheumatologist  Mild intermittent asthma: -uses albuterol prn -dneies: wheezing, cough, SOB  HM: advised shingles, hgba1c and optho exam today ROS: See pertinent positives and negatives per HPI.  Past Medical History  Diagnosis Date  . ANEMIA, HX OF 12/14/2008  . ANXIETY DISORDER, HX OF 12/14/2008  . ASTHMA 12/14/2008  . DEPRESSION 01/21/2010  . DIABETES MELLITUS, TYPE II 11/12/2006  . DYSPNEA 12/14/2008  . EDEMA, ANKLES 12/14/2008  . FATIGUE 01/04/2007  . FIBROMYALGIA 08/06/2008  . HX OF GALLSTONE 12/14/2008  . HYPERLIPIDEMIA 11/12/2006  . HYPERTENSION 11/12/2006  . OSTEOARTHRITIS 12/05/2006  . PALPITATIONS, HX OF 12/14/2008  . 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 PERIPHERAL NEUROPATHY 08/06/2008  . SINUS TACHYCARDIA,HX OF 12/14/2008  . SLEEP APNEA, OBSTRUCTIVE, MODERATE 08/02/2007  . UTI'S, HX  OF 12/14/2008    Past Surgical History  Procedure Laterality Date  . Cholecystectomy    . Abdominal hysterectomy    . Laparoscopic gastric banding    . Wrist surgery      LEFT  . Ptca      Family History  Problem Relation Age of Onset  . Other Mother     ALS  . Stroke Father   . Diabetes Sister     History   Social History  . Marital Status: Married    Spouse Name: N/A  . Number of Children: N/A  . Years of Education: N/A   Social History Main Topics  . Smoking status: Never Smoker   . Smokeless tobacco: Never Used  . Alcohol Use: Yes     Comment: glass of wine-specially occasion  . Drug Use: No  . Sexual Activity: Not on file   Other Topics Concern  . None   Social History Narrative     Current outpatient prescriptions:  .  ALPRAZolam (XANAX) 0.25 MG tablet, take 1 tablet by mouth at bedtime if needed, Disp: 60 tablet, Rfl: 2 .  aspirin 81 MG tablet, Take 81 mg by mouth daily.  , Disp: , Rfl:  .  Biotin 5000 MCG TABS, Take by mouth., Disp: , Rfl:  .  calcium-vitamin D 250-100 MG-UNIT per tablet, Take 1 tablet by mouth 2 (two) times daily., Disp: , Rfl:  .  DULoxetine (CYMBALTA) 60 MG capsule, TAKE 1 CAPSULE BY MOUTH ONCE DAILY, Disp: 30 capsule, Rfl: 5 .  folic acid (FOLVITE) 1 MG tablet, Take 1 mg by mouth daily., Disp: , Rfl:  .  FREESTYLE LITE test strip, TEST TWICE A DAY, Disp: 100 each, Rfl:  3 .  insulin aspart (NOVOLOG FLEXPEN) 100 UNIT/ML FlexPen, Inject 5 Units into the skin 3 (three) times daily with meals., Disp: 15 mL, Rfl: 11 .  Insulin Detemir (LEVEMIR) 100 UNIT/ML Pen, 25 units every morning - titrate by 3 units every 3 days to get average fasting morning blood sugar under 130, Disp: 1 pen, Rfl: 11 .  Insulin Pen Needle (NOVOFINE) 32G X 6 MM MISC, Use with levemir pen as instructed, Disp: 100 each, Rfl: 3 .  Insulin Syringe-Needle U-100 (B-D INS SYR MICROFINE 1CC/27G) 27G X 5/8" 1 ML MISC, Use 4 times daily prior to meals and at bedtime, Disp: 120  each, Rfl: 6 .  Lancet Device MISC, by Does not apply route daily.  , Disp: , Rfl:  .  lisinopril-hydrochlorothiazide (PRINZIDE,ZESTORETIC) 20-12.5 MG per tablet, TAKE 1 TABLET BY MOUTH ONCE DAILY. PATIENT NEEDS APPOINTMENT, Disp: 30 tablet, Rfl: 0 .  metFORMIN (GLUCOPHAGE) 1000 MG tablet, TAKE 1 TABLET BY MOUTH TWICE A DAY WITH FOOD. NEED OFFICE VISIT., Disp: 60 tablet, Rfl: 5 .  metoprolol tartrate (LOPRESSOR) 25 MG tablet, TAKE 1 TABLET BY MOUTH TWICE DAILY, Disp: 60 tablet, Rfl: 5 .  nitroGLYCERIN (NITROSTAT) 0.4 MG SL tablet, Place 0.4 mg under the tongue every 5 (five) minutes as needed., Disp: , Rfl:  .  pravastatin (PRAVACHOL) 40 MG tablet, Take 1 tablet (40 mg total) by mouth daily., Disp: 90 tablet, Rfl: 3 .  PRESCRIPTION MEDICATION, Ortrexa injection-weekly for psoriatic arthritis, Disp: , Rfl:  .  PROAIR HFA 108 (90 BASE) MCG/ACT inhaler, inhale 2 puffs by mouth every 6 hours if needed, Disp: 8.5 g, Rfl: 6 .  azithromycin (ZITHROMAX) 250 MG tablet, 2 tabs day 1 then 1 tab daily, Disp: 6 tablet, Rfl: 0  EXAM:  Filed Vitals:   05/28/14 1400  BP: 100/62  Pulse: 80  Temp: 97.9 F (36.6 C)    Body mass index is 30.88 kg/(m^2).  GENERAL: vitals reviewed and listed above, alert, oriented, appears well hydrated and in no acute distress  HEENT: atraumatic, conjunttiva clear, no obvious abnormalities on inspection of external nose and ears, normal appearance of ear canals and TMs, clear nasal congestion, mild post oropharyngeal erythema with PND, no tonsillar edema or exudate, no sinus TTP   NECK: no obvious masses on inspection  LUNGS: clear to auscultation bilaterally, no wheezes, rales or rhonchi, good air movement  CV: HRRR, no peripheral edema  MS: moves all extremities without noticeable abnormality  PSYCH: pleasant and cooperative, no obvious depression or anxiety  ASSESSMENT AND PLAN:  Discussed the following assessment and plan:  Acute upper respiratory  infection  -likely viral per hx and exam, no breathing problems today and normal pulm exam -tx with naal deconge for 3 days and top ins, abx if worsening, persists  Type 2 diabetes mellitus with diabetic neuropathy -she is to schedule fasitng lab appt in next 1 week -lifestyle recs  Hyperlipemia -fasting lipids  Essential hypertension -stable  Psoriatic arthritis -followed by her rheumatologist  -Patient advised to return or notify a doctor immediately if symptoms worsen or persist or new concerns arise.  Patient Instructions  BEFORE yOU LEAVE: -schedule fasting lab appt in next 1 week - drink plenty of water day of appt -schedule 3 month f/u  AFRIN for 3 days twice dialy then STOP  flonase 2 sprays each nostril for 21 days  Antibiotic if worsens, persists    follow up if worsening, new symptoms, persists     Elyn Krogh,  Lalla Laham R.

## 2014-05-29 ENCOUNTER — Other Ambulatory Visit: Payer: 59

## 2014-06-05 ENCOUNTER — Other Ambulatory Visit (INDEPENDENT_AMBULATORY_CARE_PROVIDER_SITE_OTHER): Payer: 59

## 2014-06-05 DIAGNOSIS — I10 Essential (primary) hypertension: Secondary | ICD-10-CM

## 2014-06-05 DIAGNOSIS — E1140 Type 2 diabetes mellitus with diabetic neuropathy, unspecified: Secondary | ICD-10-CM

## 2014-06-05 DIAGNOSIS — E785 Hyperlipidemia, unspecified: Secondary | ICD-10-CM

## 2014-06-05 LAB — LIPID PANEL
Cholesterol: 157 mg/dL (ref 0–200)
HDL: 49.7 mg/dL (ref >39.00)
LDL Cholesterol: 86 mg/dL (ref 0–99)
NonHDL: 107.3
TRIGLYCERIDES: 109 mg/dL (ref 0.0–149.0)
Total CHOL/HDL Ratio: 3
VLDL: 21.8 mg/dL (ref 0.0–40.0)

## 2014-06-05 LAB — BASIC METABOLIC PANEL
BUN: 17 mg/dL (ref 6–23)
CALCIUM: 9.4 mg/dL (ref 8.4–10.5)
CO2: 30 meq/L (ref 19–32)
Chloride: 101 mEq/L (ref 96–112)
Creatinine, Ser: 0.87 mg/dL (ref 0.40–1.20)
GFR: 69.5 mL/min (ref >60.00)
GLUCOSE: 124 mg/dL — AB (ref 70–99)
Potassium: 4.5 mEq/L (ref 3.5–5.1)
Sodium: 139 mEq/L (ref 135–145)

## 2014-06-05 LAB — HEMOGLOBIN A1C: Hgb A1c MFr Bld: 8 % — ABNORMAL HIGH (ref 4.6–6.5)

## 2014-06-08 ENCOUNTER — Other Ambulatory Visit: Payer: Self-pay | Admitting: Internal Medicine

## 2014-06-25 ENCOUNTER — Ambulatory Visit (INDEPENDENT_AMBULATORY_CARE_PROVIDER_SITE_OTHER): Payer: 59 | Admitting: Family Medicine

## 2014-06-25 ENCOUNTER — Encounter: Payer: Self-pay | Admitting: Family Medicine

## 2014-06-25 VITALS — BP 100/76 | HR 93 | Temp 97.7°F | Ht 64.0 in | Wt 182.6 lb

## 2014-06-25 DIAGNOSIS — E1140 Type 2 diabetes mellitus with diabetic neuropathy, unspecified: Secondary | ICD-10-CM

## 2014-06-25 DIAGNOSIS — F329 Major depressive disorder, single episode, unspecified: Secondary | ICD-10-CM | POA: Insufficient documentation

## 2014-06-25 DIAGNOSIS — F419 Anxiety disorder, unspecified: Secondary | ICD-10-CM | POA: Insufficient documentation

## 2014-06-25 NOTE — Patient Instructions (Signed)
Before you leave: -schedule follow up in 3 months  .FOR YOUR DIABETES:  []  Eat a healthy low carb diet (avoid sweets, sweet drinks, breads, potatoes, rice, etc.) and ensure 3 small meals daily.  []  Get AT LEAST 150 minutes of cardiovascular exercise per week - 30 minutes per day is best of sustained sweaty exercise.  []  Take all of your medications every day as directed by your doctor. Call your doctor immediately if you have any questions about your medications or are running low.  []  Check your blood sugar often and when you feel unwell and keep a log to bring to all health appointments. FASTING: before you eat anything in the morning POSTPRANDIAL: 1-2 hours after a meal  []  If any low blood sugars < 70, eat a snack and call your doctor immediately.  []  See an eye doctor every year and fax your diabetic eye exam to our office.  Fax: [phone]  []  Take good care of your feet and keep them soft and callus free. Check your feet daily and wear comfortable shoes. See your doctor immediately if you have any cuts, calluses or wounds on your feet.

## 2014-06-25 NOTE — Progress Notes (Signed)
HPI:  Acute visit for:  Uncontrolled diabetes mellitis: -complications: neuropathy -meds:levemir 30 units daily - advised to titrate, novolog 8-10 units with meals, metformin 1000mg  bid -on asa, statin, ace -reports: thinks higher hgba1c related to several courses of steroids over the last few months; over the last few weeks fasting BS 74-133, most are under 130; postprandial have ranged from 146-220, most over 160 -has started walking some now that she is retired, reports tough to eat well -denies: hypoglycemia, vision changes  -last eye exam: has optho exam coming up; -last foot exam: done  HM: -hiv: declined  ROS: See pertinent positives and negatives per HPI.  Past Medical History  Diagnosis Date  . Diabetes mellitus with neuropathy 12/14/2008  . Anxiety and depression 12/14/2008  . Hypertension 12/14/2008  . Hyperlipemia 01/21/2010  . Asthma 11/12/2006  . CAD (coronary artery disease) 12/14/2008    hx transluminal coronary angioplasty  . Leg edema 12/14/2008  . Fibromyalgia 01/04/2007    sees rheuamtology  . OSA (obstructive sleep apnea) 08/06/2008  . Osteoarthritis 12/14/2008  . Palpitations 11/12/2006    hx sinus tachy  . Anemia 12/14/2008  . Psoriatic arthritis     sees rheumatology    Past Surgical History  Procedure Laterality Date  . Cholecystectomy    . Abdominal hysterectomy    . Laparoscopic gastric banding    . Wrist surgery      LEFT  . Ptca      Family History  Problem Relation Age of Onset  . Other Mother     ALS  . Stroke Father   . Diabetes Sister     History   Social History  . Marital Status: Married    Spouse Name: N/A  . Number of Children: N/A  . Years of Education: N/A   Social History Main Topics  . Smoking status: Never Smoker   . Smokeless tobacco: Never Used  . Alcohol Use: Yes     Comment: glass of wine-specially occasion  . Drug Use: No  . Sexual Activity: Not on file   Other Topics Concern  . None   Social History Narrative      Current outpatient prescriptions:  .  ALPRAZolam (XANAX) 0.25 MG tablet, take 1 tablet by mouth at bedtime if needed, Disp: 60 tablet, Rfl: 2 .  aspirin 81 MG tablet, Take 81 mg by mouth daily.  , Disp: , Rfl:  .  Biotin 5000 MCG TABS, Take by mouth., Disp: , Rfl:  .  calcium-vitamin D 250-100 MG-UNIT per tablet, Take 1 tablet by mouth 2 (two) times daily., Disp: , Rfl:  .  DULoxetine (CYMBALTA) 60 MG capsule, TAKE 1 CAPSULE BY MOUTH ONCE DAILY, Disp: 30 capsule, Rfl: 5 .  folic acid (FOLVITE) 1 MG tablet, Take 1 mg by mouth daily., Disp: , Rfl:  .  FREESTYLE LITE test strip, USE TO TEST BLOOD SUGAR TWICE A DAY, Disp: 350 each, Rfl: 4 .  insulin aspart (NOVOLOG FLEXPEN) 100 UNIT/ML FlexPen, Inject 5 Units into the skin 3 (three) times daily with meals., Disp: 15 mL, Rfl: 11 .  Insulin Detemir (LEVEMIR) 100 UNIT/ML Pen, 25 units every morning - titrate by 3 units every 3 days to get average fasting morning blood sugar under 130, Disp: 1 pen, Rfl: 11 .  Insulin Pen Needle (NOVOFINE) 32G X 6 MM MISC, Use with levemir pen as instructed, Disp: 100 each, Rfl: 3 .  Insulin Syringe-Needle U-100 (B-D INS SYR MICROFINE 1CC/27G) 27G X 5/8"  1 ML MISC, Use 4 times daily prior to meals and at bedtime, Disp: 120 each, Rfl: 6 .  Lancet Device MISC, by Does not apply route daily.  , Disp: , Rfl:  .  lisinopril-hydrochlorothiazide (PRINZIDE,ZESTORETIC) 20-12.5 MG per tablet, TAKE 1 TABLET BY MOUTH ONCE DAILY. PATIENT NEEDS APPOINTMENT, Disp: 30 tablet, Rfl: 0 .  metFORMIN (GLUCOPHAGE) 1000 MG tablet, TAKE 1 TABLET BY MOUTH TWICE A DAY WITH FOOD. NEED OFFICE VISIT., Disp: 60 tablet, Rfl: 5 .  metoprolol tartrate (LOPRESSOR) 25 MG tablet, TAKE 1 TABLET BY MOUTH TWICE DAILY, Disp: 60 tablet, Rfl: 5 .  nitroGLYCERIN (NITROSTAT) 0.4 MG SL tablet, Place 0.4 mg under the tongue every 5 (five) minutes as needed., Disp: , Rfl:  .  pravastatin (PRAVACHOL) 40 MG tablet, Take 1 tablet (40 mg total) by mouth daily., Disp:  90 tablet, Rfl: 3 .  PRESCRIPTION MEDICATION, Ortrexa injection-weekly for psoriatic arthritis, Disp: , Rfl:  .  PROAIR HFA 108 (90 BASE) MCG/ACT inhaler, inhale 2 puffs by mouth every 6 hours if needed, Disp: 8.5 g, Rfl: 6  EXAM:  Filed Vitals:   06/25/14 1548  BP: 100/76  Pulse: 93  Temp: 97.7 F (36.5 C)    Body mass index is 31.33 kg/(m^2).  GENERAL: vitals reviewed and listed above, alert, oriented, appears well hydrated and in no acute distress  HEENT: atraumatic, conjunttiva clear, no obvious abnormalities on inspection of external nose and ears  NECK: no obvious masses on inspection  LUNGS: clear to auscultation bilaterally, no wheezes, rales or rhonchi, good air movement  CV: HRRR, no peripheral edema  MS: moves all extremities without noticeable abnormality  PSYCH: pleasant and cooperative, no obvious depression or anxiety  ASSESSMENT AND PLAN:  Discussed the following assessment and plan:  Type 2 diabetes mellitus with diabetic neuropathy  -discussed her blood sugars and lifestyle -advised healthy lifestyle and changing basal insulin to am and increasing mealtime insulin to match basal insulin, discussed other medicaitons for diabetes -advised diabetes educator but she reports this is not helpful -advised we could have her see endo if persistent struggle with her diabetes   -follow up 3 months -Patient advised to return or notify a doctor immediately if symptoms worsen or persist or new concerns arise.  There are no Patient Instructions on file for this visit.   Colin Benton R.

## 2014-06-25 NOTE — Progress Notes (Signed)
Pre visit review using our clinic review tool, if applicable. No additional management support is needed unless otherwise documented below in the visit note. 

## 2014-06-26 ENCOUNTER — Other Ambulatory Visit: Payer: Self-pay | Admitting: Family Medicine

## 2014-07-21 ENCOUNTER — Telehealth: Payer: Self-pay | Admitting: Family Medicine

## 2014-07-28 NOTE — Telephone Encounter (Signed)
error 

## 2014-08-07 ENCOUNTER — Other Ambulatory Visit: Payer: Self-pay | Admitting: Family Medicine

## 2014-08-26 ENCOUNTER — Ambulatory Visit: Payer: 59 | Admitting: Family Medicine

## 2014-09-09 DIAGNOSIS — L408 Other psoriasis: Secondary | ICD-10-CM | POA: Diagnosis not present

## 2014-09-09 DIAGNOSIS — L4050 Arthropathic psoriasis, unspecified: Secondary | ICD-10-CM | POA: Diagnosis not present

## 2014-09-09 DIAGNOSIS — M797 Fibromyalgia: Secondary | ICD-10-CM | POA: Diagnosis not present

## 2014-09-09 DIAGNOSIS — M19041 Primary osteoarthritis, right hand: Secondary | ICD-10-CM | POA: Diagnosis not present

## 2014-10-01 ENCOUNTER — Other Ambulatory Visit: Payer: Self-pay | Admitting: *Deleted

## 2014-10-01 MED ORDER — LISINOPRIL-HYDROCHLOROTHIAZIDE 20-12.5 MG PO TABS: 1.0000 | ORAL_TABLET | Freq: Every day | ORAL | Status: DC

## 2014-10-05 ENCOUNTER — Other Ambulatory Visit: Payer: Self-pay | Admitting: *Deleted

## 2014-10-05 MED ORDER — METOPROLOL TARTRATE 25 MG PO TABS
25.0000 mg | ORAL_TABLET | Freq: Two times a day (BID) | ORAL | Status: DC
Start: 2014-10-05 — End: 2014-10-22

## 2014-10-06 ENCOUNTER — Other Ambulatory Visit: Payer: Self-pay | Admitting: Family Medicine

## 2014-10-14 DIAGNOSIS — M5416 Radiculopathy, lumbar region: Secondary | ICD-10-CM | POA: Diagnosis not present

## 2014-10-16 ENCOUNTER — Other Ambulatory Visit: Payer: Self-pay | Admitting: Physician Assistant

## 2014-10-16 DIAGNOSIS — M545 Low back pain: Secondary | ICD-10-CM

## 2014-10-21 ENCOUNTER — Other Ambulatory Visit: Payer: Self-pay | Admitting: *Deleted

## 2014-10-21 MED ORDER — LISINOPRIL-HYDROCHLOROTHIAZIDE 20-12.5 MG PO TABS: 1.0000 | ORAL_TABLET | Freq: Every day | ORAL | Status: DC

## 2014-10-21 NOTE — Telephone Encounter (Signed)
[employer] faxed a noted requesting a 90 day supply be sent to the pts pharmacy instead of a 30 day.  Rx done.

## 2014-10-22 ENCOUNTER — Other Ambulatory Visit: Payer: Self-pay | Admitting: Family Medicine

## 2014-10-31 ENCOUNTER — Ambulatory Visit
Admission: RE | Admit: 2014-10-31 | Discharge: 2014-10-31 | Disposition: A | Discharge status: Home / Self Care | Payer: Medicare Other | Source: Ambulatory Visit | Attending: Physician Assistant | Admitting: Physician Assistant

## 2014-10-31 ENCOUNTER — Other Ambulatory Visit: Payer: Self-pay | Admitting: Family Medicine

## 2014-10-31 DIAGNOSIS — M4806 Spinal stenosis, lumbar region: Secondary | ICD-10-CM | POA: Diagnosis not present

## 2014-10-31 DIAGNOSIS — M5126 Other intervertebral disc displacement, lumbar region: Secondary | ICD-10-CM | POA: Diagnosis not present

## 2014-10-31 DIAGNOSIS — M47816 Spondylosis without myelopathy or radiculopathy, lumbar region: Secondary | ICD-10-CM | POA: Diagnosis not present

## 2014-10-31 DIAGNOSIS — M545 Low back pain: Secondary | ICD-10-CM

## 2014-11-09 DIAGNOSIS — M5416 Radiculopathy, lumbar region: Secondary | ICD-10-CM | POA: Diagnosis not present

## 2014-11-12 ENCOUNTER — Other Ambulatory Visit: Payer: Self-pay | Admitting: *Deleted

## 2014-11-12 MED ORDER — PRAVASTATIN SODIUM 40 MG PO TABS: 40.0000 mg | ORAL_TABLET | Freq: Every day | ORAL | Status: DC

## 2014-11-12 NOTE — Telephone Encounter (Signed)
Rx done. 

## 2014-11-18 DIAGNOSIS — M545 Low back pain: Secondary | ICD-10-CM | POA: Diagnosis not present

## 2014-11-18 DIAGNOSIS — M47816 Spondylosis without myelopathy or radiculopathy, lumbar region: Secondary | ICD-10-CM | POA: Diagnosis not present

## 2014-11-19 DIAGNOSIS — Z4651 Encounter for fitting and adjustment of gastric lap band: Secondary | ICD-10-CM | POA: Diagnosis not present

## 2014-11-25 ENCOUNTER — Other Ambulatory Visit: Payer: Self-pay | Admitting: Family Medicine

## 2014-11-27 ENCOUNTER — Other Ambulatory Visit: Payer: Self-pay | Admitting: Family Medicine

## 2014-12-05 ENCOUNTER — Other Ambulatory Visit: Payer: Self-pay | Admitting: Family Medicine

## 2014-12-10 LAB — HEMOGLOBIN A1C: Hgb A1c MFr Bld: 7.3 % — AB (ref 4.0–6.0)

## 2014-12-16 ENCOUNTER — Other Ambulatory Visit: Payer: Self-pay | Admitting: Family Medicine

## 2014-12-16 ENCOUNTER — Encounter: Payer: Self-pay | Admitting: Family Medicine

## 2014-12-16 ENCOUNTER — Ambulatory Visit (INDEPENDENT_AMBULATORY_CARE_PROVIDER_SITE_OTHER): Payer: Medicare Other | Admitting: Family Medicine

## 2014-12-16 VITALS — BP 110/78 | HR 70 | Temp 98.1°F | Wt 180.0 lb

## 2014-12-16 DIAGNOSIS — R319 Hematuria, unspecified: Secondary | ICD-10-CM

## 2014-12-16 DIAGNOSIS — N390 Urinary tract infection, site not specified: Secondary | ICD-10-CM

## 2014-12-16 LAB — POCT URINALYSIS DIPSTICK
Bilirubin, UA: NEGATIVE
Glucose, UA: NEGATIVE
Ketones, UA: NEGATIVE
NITRITE UA: NEGATIVE
Spec Grav, UA: 1.015
Urobilinogen, UA: 0.2
pH, UA: 7

## 2014-12-16 MED ORDER — CIPROFLOXACIN HCL 500 MG PO TABS: 500.0000 mg | ORAL_TABLET | Freq: Two times a day (BID) | ORAL | Status: DC

## 2014-12-16 NOTE — Progress Notes (Signed)
   Subjective:    Patient ID: Priscilla [name], female    DOB: 07/12/1950, 64 y.o.   MRN: [id number]  HPI  acute visit for possible UTI. Onset last night of suprapubic pressure, urine frequency and burning with urination. No fevers or chills. No flank pain. She did have episode of gross hematuria last night. No history of kidney stones. No nausea or vomiting. History of frequent UTIs in the past, though none in the past year. She's had previous infections with Escherichia coli, Klebsiella, and Proteus.  Past Medical History  Diagnosis Date  . Diabetes mellitus with neuropathy 12/14/2008  . Anxiety and depression 12/14/2008  . Hypertension 12/14/2008  . Hyperlipemia 01/21/2010  . Asthma 11/12/2006  . CAD (coronary artery disease) 12/14/2008    hx transluminal coronary angioplasty  . Leg edema 12/14/2008  . Fibromyalgia 01/04/2007    sees rheuamtology  . OSA (obstructive sleep apnea) 08/06/2008  . Osteoarthritis 12/14/2008  . Palpitations 11/12/2006    hx sinus tachy  . Anemia 12/14/2008  . Psoriatic arthritis     sees rheumatology   Past Surgical History  Procedure Laterality Date  . Cholecystectomy    . Abdominal hysterectomy    . Laparoscopic gastric banding    . Wrist surgery      LEFT  . Ptca      reports that she has never smoked. She has never used smokeless tobacco. She reports that she drinks alcohol. She reports that she does not use illicit drugs. family history includes Diabetes in her sister; Other in her mother; Stroke in her father. No Known Allergies    Review of Systems  Constitutional: Negative for fever and chills.  Gastrointestinal: Negative for nausea and vomiting.  Genitourinary: Positive for dysuria, frequency and hematuria.  Musculoskeletal: Negative for back pain.       Objective:   Physical Exam  Constitutional: She appears well-developed and well-nourished.  Cardiovascular: Normal rate and regular rhythm.   Pulmonary/Chest: Effort normal and breath sounds  normal. No respiratory distress. She has no wheezes. She has no rales.          Assessment & Plan:   Dysuria. Suspect uncomplicated cystitis. Urine culture sent. Cipro 500 mg twice daily for 5 days. Follow-up as needed

## 2014-12-16 NOTE — Progress Notes (Signed)
Pre visit review using our clinic review tool, if applicable. No additional management support is needed unless otherwise documented below in the visit note. 

## 2014-12-16 NOTE — Addendum Note (Signed)
Addended by: Elmer Picker on: 12/16/2014 04:52 PM   Modules accepted: Orders

## 2014-12-16 NOTE — Addendum Note (Signed)
Addended by: Clyde Lundborg A on: 12/16/2014 04:22 PM   Modules accepted: Orders

## 2014-12-16 NOTE — Patient Instructions (Signed)

## 2014-12-18 LAB — URINE CULTURE: Colony Count: >=100000

## 2014-12-24 ENCOUNTER — Other Ambulatory Visit: Payer: Self-pay | Admitting: Family Medicine

## 2014-12-25 DIAGNOSIS — Z79899 Other long term (current) drug therapy: Secondary | ICD-10-CM | POA: Diagnosis not present

## 2014-12-25 DIAGNOSIS — E1165 Type 2 diabetes mellitus with hyperglycemia: Secondary | ICD-10-CM | POA: Diagnosis not present

## 2014-12-25 DIAGNOSIS — E782 Mixed hyperlipidemia: Secondary | ICD-10-CM | POA: Diagnosis not present

## 2014-12-29 DIAGNOSIS — E782 Mixed hyperlipidemia: Secondary | ICD-10-CM | POA: Diagnosis not present

## 2014-12-29 DIAGNOSIS — E1165 Type 2 diabetes mellitus with hyperglycemia: Secondary | ICD-10-CM | POA: Diagnosis not present

## 2014-12-29 DIAGNOSIS — G4733 Obstructive sleep apnea (adult) (pediatric): Secondary | ICD-10-CM | POA: Diagnosis not present

## 2014-12-29 DIAGNOSIS — Z794 Long term (current) use of insulin: Secondary | ICD-10-CM | POA: Diagnosis not present

## 2014-12-29 DIAGNOSIS — Z9884 Bariatric surgery status: Secondary | ICD-10-CM | POA: Diagnosis not present

## 2014-12-29 DIAGNOSIS — E1142 Type 2 diabetes mellitus with diabetic polyneuropathy: Secondary | ICD-10-CM | POA: Diagnosis not present

## 2014-12-29 DIAGNOSIS — I10 Essential (primary) hypertension: Secondary | ICD-10-CM | POA: Diagnosis not present

## 2014-12-29 DIAGNOSIS — E11319 Type 2 diabetes mellitus with unspecified diabetic retinopathy without macular edema: Secondary | ICD-10-CM | POA: Diagnosis not present

## 2015-01-12 ENCOUNTER — Other Ambulatory Visit: Payer: Self-pay | Admitting: Family Medicine

## 2015-01-12 DIAGNOSIS — G4709 Other insomnia: Secondary | ICD-10-CM | POA: Diagnosis not present

## 2015-01-12 DIAGNOSIS — Z79899 Other long term (current) drug therapy: Secondary | ICD-10-CM | POA: Diagnosis not present

## 2015-01-12 DIAGNOSIS — M79671 Pain in right foot: Secondary | ICD-10-CM | POA: Diagnosis not present

## 2015-01-12 DIAGNOSIS — M79642 Pain in left hand: Secondary | ICD-10-CM | POA: Diagnosis not present

## 2015-01-12 DIAGNOSIS — M797 Fibromyalgia: Secondary | ICD-10-CM | POA: Diagnosis not present

## 2015-01-12 DIAGNOSIS — M79672 Pain in left foot: Secondary | ICD-10-CM | POA: Diagnosis not present

## 2015-01-12 DIAGNOSIS — M79641 Pain in right hand: Secondary | ICD-10-CM | POA: Diagnosis not present

## 2015-01-12 DIAGNOSIS — L408 Other psoriasis: Secondary | ICD-10-CM | POA: Diagnosis not present

## 2015-01-14 DIAGNOSIS — I10 Essential (primary) hypertension: Secondary | ICD-10-CM | POA: Diagnosis not present

## 2015-01-14 DIAGNOSIS — E782 Mixed hyperlipidemia: Secondary | ICD-10-CM | POA: Diagnosis not present

## 2015-01-14 DIAGNOSIS — E1165 Type 2 diabetes mellitus with hyperglycemia: Secondary | ICD-10-CM | POA: Diagnosis not present

## 2015-01-14 DIAGNOSIS — Z79899 Other long term (current) drug therapy: Secondary | ICD-10-CM | POA: Diagnosis not present

## 2015-01-14 DIAGNOSIS — Z794 Long term (current) use of insulin: Secondary | ICD-10-CM | POA: Diagnosis not present

## 2015-01-14 DIAGNOSIS — E1142 Type 2 diabetes mellitus with diabetic polyneuropathy: Secondary | ICD-10-CM | POA: Diagnosis not present

## 2015-01-14 DIAGNOSIS — G4733 Obstructive sleep apnea (adult) (pediatric): Secondary | ICD-10-CM | POA: Diagnosis not present

## 2015-01-14 DIAGNOSIS — Z9884 Bariatric surgery status: Secondary | ICD-10-CM | POA: Diagnosis not present

## 2015-01-20 ENCOUNTER — Other Ambulatory Visit: Payer: Self-pay | Admitting: Family Medicine

## 2015-02-01 ENCOUNTER — Other Ambulatory Visit: Payer: Self-pay | Admitting: Family Medicine

## 2015-02-04 ENCOUNTER — Encounter: Payer: Self-pay | Admitting: Family Medicine

## 2015-02-04 ENCOUNTER — Ambulatory Visit (INDEPENDENT_AMBULATORY_CARE_PROVIDER_SITE_OTHER): Payer: Medicare Other | Admitting: Family Medicine

## 2015-02-04 VITALS — BP 100/80 | HR 75 | Temp 98.2°F | Ht 64.0 in | Wt 174.5 lb

## 2015-02-04 DIAGNOSIS — E1140 Type 2 diabetes mellitus with diabetic neuropathy, unspecified: Secondary | ICD-10-CM | POA: Diagnosis not present

## 2015-02-04 DIAGNOSIS — F32A Depression, unspecified: Secondary | ICD-10-CM

## 2015-02-04 DIAGNOSIS — I10 Essential (primary) hypertension: Secondary | ICD-10-CM | POA: Diagnosis not present

## 2015-02-04 DIAGNOSIS — Z794 Long term (current) use of insulin: Secondary | ICD-10-CM | POA: Diagnosis not present

## 2015-02-04 DIAGNOSIS — Z23 Encounter for immunization: Secondary | ICD-10-CM

## 2015-02-04 DIAGNOSIS — F419 Anxiety disorder, unspecified: Secondary | ICD-10-CM

## 2015-02-04 DIAGNOSIS — L4050 Arthropathic psoriasis, unspecified: Secondary | ICD-10-CM

## 2015-02-04 DIAGNOSIS — E785 Hyperlipidemia, unspecified: Secondary | ICD-10-CM | POA: Diagnosis not present

## 2015-02-04 DIAGNOSIS — F418 Other specified anxiety disorders: Secondary | ICD-10-CM

## 2015-02-04 DIAGNOSIS — F329 Major depressive disorder, single episode, unspecified: Secondary | ICD-10-CM

## 2015-02-04 MED ORDER — METOPROLOL TARTRATE 25 MG PO TABS: 25.0000 mg | ORAL_TABLET | Freq: Two times a day (BID) | ORAL | Status: DC

## 2015-02-04 MED ORDER — PRAVASTATIN SODIUM 40 MG PO TABS: 40.0000 mg | ORAL_TABLET | Freq: Every day | ORAL | Status: DC

## 2015-02-04 MED ORDER — LISINOPRIL-HYDROCHLOROTHIAZIDE 20-12.5 MG PO TABS: 1.0000 | ORAL_TABLET | Freq: Every day | ORAL | Status: DC

## 2015-02-04 NOTE — Addendum Note (Signed)
Addended by: Agnes Lawrence on: 02/04/2015 05:11 PM   Modules accepted: Orders, Medications

## 2015-02-04 NOTE — Patient Instructions (Signed)
BEFORE YOU LEAVE: -flu and prevnar 13 -follow up in 4-6 months  We recommend the following healthy lifestyle measures: - eat a healthy whole foods diet consisting of regular small meals composed of vegetables, fruits, beans, nuts, seeds, healthy meats such as white chicken and fish and whole grains.  - avoid sweets, white starchy foods, fried foods, fast food, processed foods, sodas, red meet and other fattening foods.  - get a least 150-300 minutes of aerobic exercise per week.

## 2015-02-04 NOTE — Progress Notes (Signed)
HPI:  Priscilla [name] is a 41 you F pt whom has not been in for an office visit in some time. Reports she is her for medication refills.  DM with peripheral neuropathy: -seeing Dr. Elyse Hsu and doing labs, foot exam there -meds: see list -last eye exam: 12/2013, reports does have diabetic eye damage -last foot exam: 9/16 -denies: polyuria, polydipsia, vision changes, lesions on feet  Hyperlypidemia, goal LDL <100: -restarted statin 10/2013 -no leg cramps or cog impairment -reports did labs with Dr. Elyse Hsu  HTN: -meds: asa, lisinopril-hctz (20-12.5), metoporlol 25 -denies: CP, SOB, palpitations, swelling, HA  Depression and Anxiety, fibromyalgia, psoriatic arthritis: -seeing rheum, considering biologics  Hx CP/tachycardia/CAD: -rest stress myoview in 2014 -asa daily, statin, bb, acei  ROS: See pertinent positives and negatives per HPI.  Past Medical History  Diagnosis Date  . Diabetes mellitus with neuropathy ([place]) 12/14/2008  . Anxiety and depression 12/14/2008  . Hypertension 12/14/2008  . Hyperlipemia 01/21/2010  . Asthma 11/12/2006  . CAD (coronary artery disease) 12/14/2008    hx transluminal coronary angioplasty  . Leg edema 12/14/2008  . Fibromyalgia 01/04/2007    sees rheuamtology  . OSA (obstructive sleep apnea) 08/06/2008  . Osteoarthritis 12/14/2008  . Palpitations 11/12/2006    hx sinus tachy  . Anemia 12/14/2008  . Psoriatic arthritis [facility])     sees rheumatology    Past Surgical History  Procedure Laterality Date  . Cholecystectomy    . Abdominal hysterectomy    . Laparoscopic gastric banding    . Wrist surgery      LEFT  . Ptca      Family History  Problem Relation Age of Onset  . Other Mother     ALS  . Stroke Father   . Diabetes Sister     Social History   Social History  . Marital Status: Married    Spouse Name: N/A  . Number of Children: N/A  . Years of Education: N/A   Social History Main Topics  . Smoking status: Never Smoker   .  Smokeless tobacco: Never Used  . Alcohol Use: Yes     Comment: glass of wine-specially occasion  . Drug Use: No  . Sexual Activity: Not Asked   Other Topics Concern  . None   Social History Narrative     Current outpatient prescriptions:  .  ALPRAZolam (XANAX) 0.25 MG tablet, take 1 tablet by mouth at bedtime if needed, Disp: 60 tablet, Rfl: 2 .  aspirin 81 MG tablet, Take 81 mg by mouth daily.  , Disp: , Rfl:  .  Biotin 5000 MCG TABS, Take by mouth., Disp: , Rfl:  .  calcium-vitamin D 250-100 MG-UNIT per tablet, Take 1 tablet by mouth 2 (two) times daily., Disp: , Rfl:  .  DULoxetine (CYMBALTA) 60 MG capsule, TAKE 1 CAPSULE BY MOUTH ONCE DAILY, Disp: 30 capsule, Rfl: 5 .  folic acid (FOLVITE) 1 MG tablet, Take 1 mg by mouth daily., Disp: , Rfl:  .  FREESTYLE LITE test strip, USE TO TEST BLOOD SUGAR TWICE A DAY, Disp: 350 each, Rfl: 4 .  Insulin Pen Needle (NOVOFINE) 32G X 6 MM MISC, Use with levemir pen as instructed, Disp: 100 each, Rfl: 3 .  Insulin Syringe-Needle U-100 (B-D INS SYR MICROFINE 1CC/27G) 27G X 5/8" 1 ML MISC, Use 4 times daily prior to meals and at bedtime, Disp: 120 each, Rfl: 6 .  Lancet Device MISC, by Does not apply route daily.  , Disp: ,  Rfl:  .  LEVEMIR FLEXTOUCH 100 UNIT/ML Pen, Inject 25 units every A.M. (titrate by 3 units every 3 days to get average fasting A.M. blood sugar under 130), Disp: 15 mL, Rfl: 0 .  lisinopril-hydrochlorothiazide (PRINZIDE,ZESTORETIC) 20-12.5 MG tablet, Take 1 tablet by mouth daily., Disp: 90 tablet, Rfl: 3 .  metFORMIN (GLUCOPHAGE) 1000 MG tablet, TAKE 1 TABLET BY MOUTH 2 TIMES DAILY WITH FOOD., Disp: 60 tablet, Rfl: 0 .  metoprolol tartrate (LOPRESSOR) 25 MG tablet, Take 1 tablet (25 mg total) by mouth 2 (two) times daily., Disp: 180 tablet, Rfl: 3 .  nitroGLYCERIN (NITROSTAT) 0.4 MG SL tablet, Place 0.4 mg under the tongue every 5 (five) minutes as needed., Disp: , Rfl:  .  NOVOLOG FLEXPEN 100 UNIT/ML FlexPen, Inject 5 units into  the skin 3 times daily with meals, Disp: 15 mL, Rfl: 0 .  pravastatin (PRAVACHOL) 40 MG tablet, Take 1 tablet (40 mg total) by mouth daily., Disp: 90 tablet, Rfl: 3 .  PRESCRIPTION MEDICATION, Ortrexa injection-weekly for psoriatic arthritis, Disp: , Rfl:  .  PROAIR HFA 108 (90 BASE) MCG/ACT inhaler, inhale 2 puffs by mouth every 6 hours if needed, Disp: 8.5 g, Rfl: 6  EXAM:  Filed Vitals:   02/04/15 1610  BP: 100/80  Pulse: 75  Temp: 98.2 F (36.8 C)    Body mass index is 29.94 kg/(m^2).  GENERAL: vitals reviewed and listed above, alert, oriented, appears well hydrated and in no acute distress  HEENT: atraumatic, conjunttiva clear, no obvious abnormalities on inspection of external nose and ears  NECK: no obvious masses on inspection  LUNGS: clear to auscultation bilaterally, no wheezes, rales or rhonchi, good air movement  CV: HRRR, no peripheral edema  MS: moves all extremities without noticeable abnormality  PSYCH: pleasant and cooperative, no obvious depression or anxiety  ASSESSMENT AND PLAN:  Discussed the following assessment and plan:  Type 2 diabetes mellitus with diabetic neuropathy, with long-term current use of insulin (HCC)  Hyperlipemia  Essential hypertension  Psoriatic arthritis - sees rheumatologist  Anxiety and depression  -will have assistant update health maintenance and labs per Dr. Elyse Hsu records -prenar 13 and flu shot today -BP and cholesterol medications refilled -advised to schedule eye exam -Patient advised to return or notify a doctor immediately if symptoms worsen or persist or new concerns arise.  Patient Instructions  BEFORE YOU LEAVE: -flu and prevnar 13 -follow up in 4-6 months  We recommend the following healthy lifestyle measures: - eat a healthy whole foods diet consisting of regular small meals composed of vegetables, fruits, beans, nuts, seeds, healthy meats such as white chicken and fish and whole grains.  - avoid  sweets, white starchy foods, fried foods, fast food, processed foods, sodas, red meet and other fattening foods.  - get a least 150-300 minutes of aerobic exercise per week.       Colin Benton R.

## 2015-03-08 DIAGNOSIS — E782 Mixed hyperlipidemia: Secondary | ICD-10-CM | POA: Diagnosis not present

## 2015-03-08 DIAGNOSIS — E1165 Type 2 diabetes mellitus with hyperglycemia: Secondary | ICD-10-CM | POA: Diagnosis not present

## 2015-03-10 DIAGNOSIS — E782 Mixed hyperlipidemia: Secondary | ICD-10-CM | POA: Diagnosis not present

## 2015-03-10 DIAGNOSIS — Z9884 Bariatric surgery status: Secondary | ICD-10-CM | POA: Diagnosis not present

## 2015-03-10 DIAGNOSIS — E1142 Type 2 diabetes mellitus with diabetic polyneuropathy: Secondary | ICD-10-CM | POA: Diagnosis not present

## 2015-03-10 DIAGNOSIS — Z794 Long term (current) use of insulin: Secondary | ICD-10-CM | POA: Diagnosis not present

## 2015-03-10 DIAGNOSIS — E1165 Type 2 diabetes mellitus with hyperglycemia: Secondary | ICD-10-CM | POA: Diagnosis not present

## 2015-03-10 DIAGNOSIS — I10 Essential (primary) hypertension: Secondary | ICD-10-CM | POA: Diagnosis not present

## 2015-03-10 DIAGNOSIS — Z79899 Other long term (current) drug therapy: Secondary | ICD-10-CM | POA: Diagnosis not present

## 2015-03-10 DIAGNOSIS — G4733 Obstructive sleep apnea (adult) (pediatric): Secondary | ICD-10-CM | POA: Diagnosis not present

## 2015-03-19 ENCOUNTER — Encounter: Payer: Self-pay | Admitting: Family Medicine

## 2015-04-13 ENCOUNTER — Ambulatory Visit: Payer: Medicare Other | Admitting: Family Medicine

## 2015-04-14 DIAGNOSIS — J069 Acute upper respiratory infection, unspecified: Secondary | ICD-10-CM | POA: Diagnosis not present

## 2015-05-03 DIAGNOSIS — M47816 Spondylosis without myelopathy or radiculopathy, lumbar region: Secondary | ICD-10-CM | POA: Diagnosis not present

## 2015-05-18 ENCOUNTER — Ambulatory Visit (INDEPENDENT_AMBULATORY_CARE_PROVIDER_SITE_OTHER): Payer: Medicare Other | Admitting: Internal Medicine

## 2015-05-18 ENCOUNTER — Other Ambulatory Visit: Payer: Self-pay | Admitting: *Deleted

## 2015-05-18 ENCOUNTER — Encounter: Payer: Self-pay | Admitting: Internal Medicine

## 2015-05-18 ENCOUNTER — Other Ambulatory Visit (INDEPENDENT_AMBULATORY_CARE_PROVIDER_SITE_OTHER): Payer: Medicare Other | Admitting: *Deleted

## 2015-05-18 VITALS — BP 110/58 | HR 69 | Temp 98.2°F | Resp 12 | Ht 63.75 in | Wt 175.6 lb

## 2015-05-18 DIAGNOSIS — Z794 Long term (current) use of insulin: Secondary | ICD-10-CM

## 2015-05-18 DIAGNOSIS — E1140 Type 2 diabetes mellitus with diabetic neuropathy, unspecified: Secondary | ICD-10-CM

## 2015-05-18 DIAGNOSIS — E11319 Type 2 diabetes mellitus with unspecified diabetic retinopathy without macular edema: Secondary | ICD-10-CM | POA: Insufficient documentation

## 2015-05-18 DIAGNOSIS — E1165 Type 2 diabetes mellitus with hyperglycemia: Secondary | ICD-10-CM

## 2015-05-18 DIAGNOSIS — IMO0002 Reserved for concepts with insufficient information to code with codable children: Secondary | ICD-10-CM | POA: Insufficient documentation

## 2015-05-18 LAB — POCT GLYCOSYLATED HEMOGLOBIN (HGB A1C): Hemoglobin A1C: 7.4

## 2015-05-18 MED ORDER — INSULIN ASPART 100 UNIT/ML FLEXPEN: 6.0000 [IU] | PEN_INJECTOR | Freq: Three times a day (TID) | SUBCUTANEOUS | Status: DC

## 2015-05-18 MED ORDER — INSULIN DETEMIR 100 UNIT/ML FLEXPEN: 40.0000 [IU] | PEN_INJECTOR | Freq: Every day | SUBCUTANEOUS | Status: DC

## 2015-05-18 MED ORDER — METFORMIN HCL 1000 MG PO TABS: ORAL_TABLET | ORAL | Status: DC

## 2015-05-18 MED ORDER — GLUCAGON (RDNA) 1 MG IJ KIT: 1.0000 mg | PACK | Freq: Once | INTRAMUSCULAR | Status: DC | PRN

## 2015-05-18 NOTE — Progress Notes (Signed)
Patient ID: Priscilla [name], female   DOB: 11/22/1950, 65 y.o.   MRN: [id number]  HPI: Priscilla [name] is a 65 y.o.-year-old female, referred by her PCP, Dr. 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 She previously saw Drs Dwyane Dee (distant past) and [place] (more recently).   She recently retired in 08/2014.   She is s/p lap band Gastric bypass 2010.  Last hemoglobin A1c was: 02/2015: HbA1c 7.4%, fructosamine 237 >> corresponding HbA1c 5.6% Lab Results  Component Value Date   HGBA1C 7.3* 12/10/2014   HGBA1C 8.0* 06/05/2014   HGBA1C 7.6* 09/26/2013   She gets steroid inj's - 3 weeks ago.  Pt is on a regimen of: - Metformin 1000 mg 2x a day, with meals - Levemir 50 units in am She was on Victoza 2.5 mg daily >> stopped as she could not afford this. Sugars higher after she stopped. She was also on NovoLog before, which she has at home, but did not restart.  Pt checks her sugars 1-2x a day and they are: - am: 160-219, 282 - 2h after b'fast: 186-225 - before lunch: 77, 91 - 2h after lunch: 172, 270 - before dinner: 134, 153 - 2h after dinner: 194 - bedtime: n/c - nighttime: n/c She passed out from No lows now. Lowest sugar was 72; she has hypoglycemia awareness at 70.  Highest sugar was 282.  Glucometer: Freestyle  She tells me she has small meals throughout the day, but she is trying to get into a routine in which she eats 3 main meals a day.  - no CKD, last BUN/creatinine:  Lab Results  Component Value Date   BUN 17 06/05/2014   CREATININE 0.87 06/05/2014  ACR (12/2014): 6.9 On Lisinopril. - last set of lipids: Lab Results  Component Value Date   CHOL 157 06/05/2014   HDL 49.70 06/05/2014   LDLCALC 86 06/05/2014   TRIG 109.0 06/05/2014   CHOLHDL 3 06/05/2014  On Pravastatin. - last eye exam was in 12/2013. 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 - + numbness and tingling in her feet. Last foot exam 12/2014. She has  PN.  Pt has FH of DM in father, mother's siblings.   ROS: Constitutional: no weight gain/loss, no fatigue, no subjective hyperthermia/hypothermia Eyes: no blurry vision, no xerophthalmia ENT: no sore throat, no nodules palpated in throat, no dysphagia/odynophagia, no hoarseness Cardiovascular: no CP/SOB/palpitations/leg swelling Respiratory: no cough/SOB Gastrointestinal: no N/V/D/C Musculoskeletal: no muscle/joint aches Skin: no rashes Neurological: no tremors/numbness/tingling/dizziness Psychiatric: no depression/anxiety  Past Medical History  Diagnosis Date  . Diabetes mellitus with neuropathy ([place]) 12/14/2008  . Anxiety and depression 12/14/2008  . Hypertension 12/14/2008  . Hyperlipemia 01/21/2010  . Asthma 11/12/2006  . CAD (coronary artery disease) 12/14/2008    hx transluminal coronary angioplasty  . Leg edema 12/14/2008  . Fibromyalgia 01/04/2007    sees rheuamtology  . OSA (obstructive sleep apnea) 08/06/2008  . Osteoarthritis 12/14/2008  . Palpitations 11/12/2006    hx sinus tachy  . Anemia 12/14/2008  . Psoriatic arthritis [facility])     sees rheumatology   Past Surgical History  Procedure Laterality Date  . Cholecystectomy    . Abdominal hysterectomy    . Laparoscopic gastric banding    . Wrist surgery      LEFT  . Ptca     Social History   Social History  . Marital Status: Married    Spouse Name: N/A  . Number of Children:  N/A  . Years of Education: N/A   Occupational History  . Not on file.   Social History Main Topics  . Smoking status: Never Smoker   . Smokeless tobacco: Never Used  . Alcohol Use: Yes     Comment: glass of wine-specially occasion  . Drug Use: No  . Sexual Activity: Not on file   Other Topics Concern  . Not on file   Social History Narrative   Current Outpatient Prescriptions on File Prior to Visit  Medication Sig Dispense Refill  . aspirin 81 MG tablet Take 81 mg by mouth daily.      . Biotin 5000 MCG TABS Take by mouth.    .  calcium-vitamin D 250-100 MG-UNIT per tablet Take 1 tablet by mouth 2 (two) times daily.    . DULoxetine (CYMBALTA) 60 MG capsule TAKE 1 CAPSULE BY MOUTH ONCE DAILY 30 capsule 5  . FREESTYLE LITE test strip USE TO TEST BLOOD SUGAR TWICE A DAY 350 each 4  . Insulin Pen Needle (NOVOFINE) 32G X 6 MM MISC Use with levemir pen as instructed 100 each 3  . Insulin Syringe-Needle U-100 (B-D INS SYR MICROFINE 1CC/27G) 27G X 5/8" 1 ML MISC Use 4 times daily prior to meals and at bedtime 120 each 6  . Lancet Device MISC by Does not apply route daily.      Marland Kitchen lisinopril-hydrochlorothiazide (PRINZIDE,ZESTORETIC) 20-12.5 MG tablet Take 1 tablet by mouth daily. 90 tablet 3  . metoprolol tartrate (LOPRESSOR) 25 MG tablet Take 1 tablet (25 mg total) by mouth 2 (two) times daily. 180 tablet 3  . pravastatin (PRAVACHOL) 40 MG tablet Take 1 tablet (40 mg total) by mouth daily. 90 tablet 3  . PROAIR HFA 108 (90 BASE) MCG/ACT inhaler inhale 2 puffs by mouth every 6 hours if needed 8.5 g 6  . traZODone (DESYREL) 50 MG tablet Take 50 mg by mouth at bedtime. Given by Dr Estanislado Pandy per patient    . nitroGLYCERIN (NITROSTAT) 0.4 MG SL tablet Place 0.4 mg under the tongue every 5 (five) minutes as needed. Reported on 05/18/2015    . PRESCRIPTION MEDICATION Reported on 05/18/2015     No current facility-administered medications on file prior to visit.   No Known Allergies Family History  Problem Relation Age of Onset  . Other Mother     ALS  . Stroke Father   . Diabetes Sister    PE: BP 110/58 mmHg  Pulse 69  Temp(Src) 98.2 F (36.8 C) (Oral)  Resp 12  Ht 5' 3.75" (1.619 m)  Wt 175 lb 9.6 oz (79.652 kg)  BMI 30.39 kg/m2  SpO2 98% Wt Readings from Last 3 Encounters:  05/18/15 175 lb 9.6 oz (79.652 kg)  02/04/15 174 lb 8 oz (79.153 kg)  12/16/14 180 lb (81.647 kg)   Constitutional: overweight, in NAD Eyes: PERRLA, EOMI, no exophthalmos ENT: moist mucous membranes, no thyromegaly, no cervical  lymphadenopathy Cardiovascular: RRR, No MRG Respiratory: CTA B Gastrointestinal: abdomen soft, NT, ND, BS+ Musculoskeletal: no deformities, strength intact in all 4 Skin: moist, warm, no rashes Neurological: + mild tremor with outstretched hands, DTR normal in all 4  ASSESSMENT: 1. DM2, insulin-dependent, uncontrolled, with complications - Diabetic retinopathy and left eye - Peripheral neuropathy   PLAN:  1. Patient with long-standing, uncontrolled diabetes, on oral antidiabetic regimen + basal insulin, which became insufficient. Her sugars are lower before meals (especially before lunch which usually delays), but they are high after meals. I suggested to  decrease the dose of Levemir and add back NovoLog. Victoza would be a great option, but she cannot afford it right now. We also discussed about the possibility of using N and R insulins, and she is not opposed to using vials. This may be an option in the future if Levemir and NovoLog become too expensive. For now, I advised her to use the following medication doses: Patient Instructions  Please continue Metformin 1000 mg 2x a day with meals.  Please decrease Levemir to 40 units and move this to bedtime.  Please start Novolog:  - 6 units before a smaller meal - 8 units before a larger meal  Add Novolog SSI: - 151-175: + 1 unit  - 176-200: + 2 units  - 201-225: + 3 units  - 226-250: + 4 units  - >250: + 5 units  If sugars before the meal are 60 or lower, Please do not take the Novolog dose. If sugars before the meal are 61-80, take only half of the Novolog dose.  Please let me know if the sugars are consistently <80 or >200.  - Strongly advised her to start checking sugars at different times of the day - check 2-3 times a day, rotating checks - given sugar log and advised how to fill it and to bring it at next appt  - given foot care handout and explained the principles  - given instructions for hypoglycemia management "15-15  rule"  - advised for yearly eye exams  - Interestingly, she had a fructosamine checked 3 months ago, with a calculated hemoglobin A1c of 5.6%. However, this is not concordant with the CBGs in her meter today... - check HbA1c today >> 7.4% (Stable) - Return to clinic in 1.5 mo with sugar log

## 2015-05-18 NOTE — Patient Instructions (Signed)
Please continue Metformin 1000 mg 2x a day with meals.  Please decrease Levemir to 40 units and move this to bedtime.  Please start Novolog:  - 6 units before a smaller meal - 8 units before a larger meal  Add Novolog SSI: - 151-175: + 1 unit  - 176-200: + 2 units  - 201-225: + 3 units  - 226-250: + 4 units  - >250: + 5 units  If sugars before the meal are 60 or lower, Please do not take the Novolog dose. If sugars before the meal are 61-80, take only half of the Novolog dose.  Please let me know if the sugars are consistently <80 or >200.  PATIENT INSTRUCTIONS FOR TYPE 2 DIABETES:  **Please join MyChart!** - see attached instructions about how to join if you have not done so already.  DIET AND EXERCISE Diet and exercise is an important part of diabetic treatment.  We recommended aerobic exercise in the form of brisk walking (working between 40-60% of maximal aerobic capacity, similar to brisk walking) for 150 minutes per week (such as 30 minutes five days per week) along with 3 times per week performing 'resistance' training (using various gauge rubber tubes with handles) 5-10 exercises involving the major muscle groups (upper body, lower body and core) performing 10-15 repetitions (or near fatigue) each exercise. Start at half the above goal but build slowly to reach the above goals. If limited by weight, joint pain, or disability, we recommend daily walking in a swimming pool with water up to waist to reduce pressure from joints while allow for adequate exercise.    BLOOD GLUCOSES Monitoring your blood glucoses is important for continued management of your diabetes. Please check your blood glucoses 2-4 times a day: fasting, before meals and at bedtime (you can rotate these measurements - e.g. one day check before the 3 meals, the next day check before 2 of the meals and before bedtime, etc.).   HYPOGLYCEMIA (low blood sugar) Hypoglycemia is usually a reaction to not eating,  exercising, or taking too much insulin/ other diabetes drugs.  Symptoms include tremors, sweating, hunger, confusion, headache, etc. Treat IMMEDIATELY with 15 grams of Carbs: . 4 glucose tablets .  cup regular juice/soda . 2 tablespoons raisins . 4 teaspoons sugar . 1 tablespoon honey Recheck blood glucose in 15 mins and repeat above if still symptomatic/blood glucose <100.  RECOMMENDATIONS TO REDUCE YOUR RISK OF DIABETIC COMPLICATIONS: * Take your prescribed MEDICATION(S) * Follow a DIABETIC diet: Complex carbs, fiber rich foods, (monounsaturated and polyunsaturated) fats * AVOID saturated/trans fats, high fat foods, >2,300 mg salt per day. * EXERCISE at least 5 times a week for 30 minutes or preferably daily.  * DO NOT SMOKE OR DRINK more than 1 drink a day. * Check your FEET every day. Do not wear tightfitting shoes. Contact us if you develop an ulcer * See your EYE doctor once a year or more if needed * Get a FLU shot once a year * Get a PNEUMONIA vaccine once before and once after age 1 years  GOALS:  * Your Hemoglobin A1c of <7%  * fasting sugars need to be <130 * after meals sugars need to be <180 (2h after you start eating) * Your Systolic BP should be [id number] or lower  * Your Diastolic BP should be 80 or lower  * Your HDL (Good Cholesterol) should be 40 or higher  * Your LDL (Bad Cholesterol) should be 100 or lower. * Your  Triglycerides should be 150 or lower  * Your Urine microalbumin (kidney function) should be <30 * Your Body Mass Index should be 25 or lower    Please consider the following ways to cut down carbs and fat and increase fiber and micronutrients in your diet: - substitute whole grain for white bread or pasta - substitute brown rice for white rice - substitute 90-calorie flat bread pieces for slices of bread when possible - substitute sweet potatoes or yams for white potatoes - substitute humus for margarine - substitute tofu for cheese when possible -  substitute almond or rice milk for regular milk (would not drink soy milk daily due to concern for soy estrogen influence on breast cancer risk) - substitute dark chocolate for other sweets when possible - substitute water - can add lemon or orange slices for taste - for diet sodas (artificial sweeteners will trick your body that you can eat sweets without getting calories and will lead you to overeating and weight gain in the long run) - do not skip breakfast or other meals (this will slow down the metabolism and will result in more weight gain over time)  - can try smoothies made from fruit and almond/rice milk in am instead of regular breakfast - can also try old-fashioned (not instant) oatmeal made with almond/rice milk in am - order the dressing on the side when eating salad at a restaurant (pour less than half of the dressing on the salad) - eat as little meat as possible - can try juicing, but should not forget that juicing will get rid of the fiber, so would alternate with eating raw veg./fruits or drinking smoothies - use as little oil as possible, even when using olive oil - can dress a salad with a mix of balsamic vinegar and lemon juice, for e.g. - use agave nectar, stevia sugar, or regular sugar rather than artificial sweateners - steam or broil/roast veggies  - snack on veggies/fruit/nuts (unsalted, preferably) when possible, rather than processed foods - reduce or eliminate aspartame in diet (it is in diet sodas, chewing gum, etc) Read the labels!  Try to read Dr. Janene Harvey book: "Program for Reversing Diabetes" for other ideas for healthy eating.

## 2015-06-08 DIAGNOSIS — L408 Other psoriasis: Secondary | ICD-10-CM | POA: Diagnosis not present

## 2015-06-08 DIAGNOSIS — M19241 Secondary osteoarthritis, right hand: Secondary | ICD-10-CM | POA: Diagnosis not present

## 2015-06-08 DIAGNOSIS — G4709 Other insomnia: Secondary | ICD-10-CM | POA: Diagnosis not present

## 2015-06-08 DIAGNOSIS — M797 Fibromyalgia: Secondary | ICD-10-CM | POA: Diagnosis not present

## 2015-06-29 ENCOUNTER — Encounter: Payer: Self-pay | Admitting: Family Medicine

## 2015-07-01 DIAGNOSIS — Z9884 Bariatric surgery status: Secondary | ICD-10-CM | POA: Diagnosis not present

## 2015-07-03 ENCOUNTER — Other Ambulatory Visit: Payer: Self-pay | Admitting: Family Medicine

## 2015-07-05 ENCOUNTER — Ambulatory Visit (INDEPENDENT_AMBULATORY_CARE_PROVIDER_SITE_OTHER): Payer: Medicare Other | Admitting: Internal Medicine

## 2015-07-05 ENCOUNTER — Encounter: Payer: Self-pay | Admitting: Internal Medicine

## 2015-07-05 VITALS — BP 118/64 | HR 87 | Temp 98.5°F | Resp 12 | Wt 173.8 lb

## 2015-07-05 DIAGNOSIS — E1165 Type 2 diabetes mellitus with hyperglycemia: Secondary | ICD-10-CM | POA: Diagnosis not present

## 2015-07-05 DIAGNOSIS — E11319 Type 2 diabetes mellitus with unspecified diabetic retinopathy without macular edema: Secondary | ICD-10-CM | POA: Diagnosis not present

## 2015-07-05 DIAGNOSIS — Z794 Long term (current) use of insulin: Secondary | ICD-10-CM

## 2015-07-05 NOTE — Patient Instructions (Signed)
Please continue: - Metformin 1000 mg 2x a day with meals. - Levemir 40 units at bedtime. - Mealtime Novolog:  - 6 units before a smaller meal - 8 units before a larger meal - Sliding scale Novolog: - 151-175: + 1 unit  - 176-200: + 2 units  - 201-225: + 3 units  - 226-250: + 4 units  - >250: + 5 units If sugars before the meal are 60 or lower, please do not take the Novolog dose. If sugars before the meal are 61-80, take only half of the Novolog dose.

## 2015-07-05 NOTE — Progress Notes (Signed)
Patient ID: Priscilla [name], female   DOB: 1950/08/31, 65 y.o.   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 She previously saw Drs Dwyane Dee (distant past) and [place] (more recently). Last visit with me a month and a half ago. She will see Dr. Raoul Pitch.  She started a protein shake midmorning 2 weeks ago >> sugars better.  Last hemoglobin A1c was: 02/2015: HbA1c 7.4%, fructosamine 237 >> corresponding HbA1c 5.6% Lab Results  Component Value Date   HGBA1C 7.4 05/18/2015   HGBA1C 7.3* 12/10/2014   HGBA1C 8.0* 06/05/2014   She gets steroid inj's - Before last visit.  Pt was on a regimen of: - Metformin 1000 mg 2x a day, with meals - Levemir 50 units in am She was on Victoza 2.5 mg daily >> stopped as she could not afford this. Sugars higher after she stopped. She was also on NovoLog before, which she has at home, but did not restart.  At last visit, we changed to: - Metformin 1000 mg 2x a day with meals. - Levemir 40 units at bedtime. - Mealtime Novolog:  - 6 units before a smaller meal - 8 units before a larger meal - Sliding scale Novolog: - 151-175: + 1 unit  - 176-200: + 2 units  - 201-225: + 3 units  - 226-250: + 4 units  - >250: + 5 units If sugars before the meal are 60 or lower, Please do not take the Novolog dose. If sugars before the meal are 61-80, take only half of the Novolog dose.  Pt checks her sugars 1-2x a day and they are: - am: 160-219, 282 >> 82, 99-132, 172 - 2h after b'fast: 186-225 >> 175 - before lunch: 77, 91 >> 90-133, 217 - 2h after lunch: 172, 270 >> 106-129, 222 - before dinner: 134, 153 >> 110-134 - 2h after dinner: 194 >> 193 - bedtime: n/c >> 81-147 - nighttime: n/c She passed out from No lows now. Lowest sugar was 72 >> 81; she has hypoglycemia awareness at 70.  Highest sugar was 282 >>  286.  Glucometer: Freestyle  - no CKD, last BUN/creatinine:  Lab Results  Component Value Date   BUN 17 06/05/2014   CREATININE 0.87 06/05/2014  ACR (12/2014): 6.9 On Lisinopril. - last set of lipids: Lab Results  Component Value Date   CHOL 157 06/05/2014   HDL 49.70 06/05/2014   LDLCALC 86 06/05/2014   TRIG 109.0 06/05/2014   CHOLHDL 3 06/05/2014  On Pravastatin. - last eye exam was in 12/2013. 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 Will have one in May. - + numbness and tingling in her feet. Last foot exam 12/2014. She has PN.  Last TSH: Lab Results  Component Value Date   TSH 0.86 09/26/2013   ROS: Constitutional: no weight gain/loss, no fatigue, no subjective hyperthermia/hypothermia Eyes: no blurry vision, no xerophthalmia ENT: no sore throat, no nodules palpated in throat, no dysphagia/odynophagia, no hoarseness Cardiovascular: no CP/SOB/palpitations/leg swelling Respiratory: no cough/SOB Gastrointestinal: no N/V/D/C Musculoskeletal: no muscle/joint aches Skin: no rashes Neurological: no tremors/numbness/tingling/dizziness  I reviewed pt's medications, allergies, PMH, social hx, family hx, and changes were documented in the history of present illness. Otherwise, unchanged from my initial visit note.  Past Medical History  Diagnosis Date  . Diabetes mellitus with neuropathy ([place]) 12/14/2008  . Anxiety and depression 12/14/2008  . Hypertension 12/14/2008  .  Hyperlipemia 01/21/2010  . Asthma 11/12/2006  . CAD (coronary artery disease) 12/14/2008    hx transluminal coronary angioplasty  . Leg edema 12/14/2008  . Fibromyalgia 01/04/2007    sees rheuamtology  . OSA (obstructive sleep apnea) 08/06/2008  . Osteoarthritis 12/14/2008  . Palpitations 11/12/2006    hx sinus tachy  . Anemia 12/14/2008  . Psoriatic arthritis [facility])     sees rheumatology   Past Surgical History  Procedure Laterality Date  . Cholecystectomy    . Abdominal hysterectomy    . Laparoscopic gastric banding    . Wrist surgery       LEFT  . Ptca     Social History   Social History  . Marital Status: Married    Spouse Name: N/A  . Number of Children: N/A   Occupational History  . Not on file.   Social History Main Topics  . Smoking status: Never Smoker   . Smokeless tobacco: Never Used  . Alcohol Use: Yes     Comment: glass of wine-specially occasion  . Drug Use: No   Current Outpatient Prescriptions on File Prior to Visit  Medication Sig Dispense Refill  . aspirin 81 MG tablet Take 81 mg by mouth daily.      . Biotin 5000 MCG TABS Take by mouth.    . calcium-vitamin D 250-100 MG-UNIT per tablet Take 1 tablet by mouth 2 (two) times daily.    . DULoxetine (CYMBALTA) 60 MG capsule TAKE 1 CAPSULE BY MOUTH ONCE DAILY 30 capsule 5  . FREESTYLE LITE test strip USE TO TEST BLOOD SUGAR TWICE A DAY 350 each 4  . glucagon (GLUCAGON EMERGENCY) 1 MG injection Inject 1 mg into the muscle once as needed. 1 each 12  . insulin aspart (NOVOLOG FLEXPEN) 100 UNIT/ML FlexPen Inject 6-8 Units into the skin 3 (three) times daily with meals. Plus sliding scale. 15 mL 2  . Insulin Detemir (LEVEMIR FLEXTOUCH) 100 UNIT/ML Pen Inject 40 Units into the skin at bedtime. 15 mL 2  . Insulin Pen Needle (NOVOFINE) 32G X 6 MM MISC Use with levemir pen as instructed 100 each 3  . Insulin Syringe-Needle U-100 (B-D INS SYR MICROFINE 1CC/27G) 27G X 5/8" 1 ML MISC Use 4 times daily prior to meals and at bedtime 120 each 6  . Lancet Device MISC by Does not apply route daily.      Marland Kitchen lisinopril-hydrochlorothiazide (PRINZIDE,ZESTORETIC) 20-12.5 MG tablet Take 1 tablet by mouth daily. 90 tablet 3  . metFORMIN (GLUCOPHAGE) 1000 MG tablet TAKE 1 TABLET BY MOUTH 2 TIMES DAILY WITH A MEAL. 60 tablet 2  . metoprolol tartrate (LOPRESSOR) 25 MG tablet Take 1 tablet (25 mg total) by mouth 2 (two) times daily. 180 tablet 3  . Multiple Vitamin (MULTIVITAMIN) tablet Take 1 tablet by mouth daily.    . nitroGLYCERIN (NITROSTAT) 0.4 MG SL tablet Place 0.4 mg under  the tongue every 5 (five) minutes as needed. Reported on 05/18/2015    . pravastatin (PRAVACHOL) 40 MG tablet Take 1 tablet (40 mg total) by mouth daily. 90 tablet 3  . PRESCRIPTION MEDICATION Reported on 05/18/2015    . PROAIR HFA 108 (90 BASE) MCG/ACT inhaler inhale 2 puffs by mouth every 6 hours if needed 8.5 g 6  . traZODone (DESYREL) 50 MG tablet Take 50 mg by mouth at bedtime. Given by Dr Estanislado Pandy per patient     No current facility-administered medications on file prior to visit.   No Known Allergies Family  History  Problem Relation Age of Onset  . Other Mother     ALS  . Stroke Father   . Diabetes Sister    PE: BP 118/64 mmHg  Pulse 87  Temp(Src) 98.5 F (36.9 C) (Oral)  Resp 12  Wt 173 lb 12.8 oz (78.835 kg)  SpO2 98% Body mass index is 30.08 kg/(m^2). Wt Readings from Last 3 Encounters:  07/05/15 173 lb 12.8 oz (78.835 kg)  05/18/15 175 lb 9.6 oz (79.652 kg)  02/04/15 174 lb 8 oz (79.153 kg)   Constitutional: overweight, in NAD Eyes: PERRLA, EOMI, no exophthalmos ENT: moist mucous membranes, no thyromegaly, no cervical lymphadenopathy Cardiovascular: RRR, No MRG Respiratory: CTA B Gastrointestinal: abdomen soft, NT, ND, BS+ Musculoskeletal: no deformities, strength intact in all 4 Skin: moist, warm, no rashes Neurological: no tremor with outstretched hands, DTR normal in all 4  ASSESSMENT: 1. DM2, insulin-dependent, uncontrolled, with complications - Diabetic retinopathy and left eye - Peripheral neuropathy   PLAN:  1. Patient with long-standing, uncontrolled diabetes, on oral antidiabetic regimen + basal insulin + now bolus insulin, with improved sugars.  - Her sugars are greatly improved with reduction of Levemir and addition of NovoLog:  Patient Instructions  Please continue: - Metformin 1000 mg 2x a day with meals. - Levemir 40 units at bedtime. - Mealtime Novolog:  - 6 units before a smaller meal - 8 units before a larger meal - Sliding scale  Novolog: - 151-175: + 1 unit  - 176-200: + 2 units  - 201-225: + 3 units  - 226-250: + 4 units  - >250: + 5 units If sugars before the meal are 60 or lower, Please do not take the Novolog dose. If sugars before the meal are 61-80, take only half of the Novolog dose.  Please let me know if the sugars are consistently <80 or >200.  - Continue checking sugars at different times of the day - check 2-3 times a day, rotating checks - advised for yearly eye exams >> has one coming up - reviewed last HbA1c >> 7.4% (Stable) - Return to clinic in 3 mo with sugar log >> will check at next visit >> if discordant with log >> will check a fructosamine

## 2015-07-19 ENCOUNTER — Ambulatory Visit (INDEPENDENT_AMBULATORY_CARE_PROVIDER_SITE_OTHER): Payer: Medicare Other | Admitting: Family Medicine

## 2015-07-19 ENCOUNTER — Encounter: Payer: Self-pay | Admitting: Family Medicine

## 2015-07-19 ENCOUNTER — Other Ambulatory Visit (INDEPENDENT_AMBULATORY_CARE_PROVIDER_SITE_OTHER): Payer: Medicare Other

## 2015-07-19 VITALS — BP 102/73 | HR 88 | Temp 98.4°F | Resp 20 | Ht 63.75 in | Wt 171.5 lb

## 2015-07-19 DIAGNOSIS — R42 Dizziness and giddiness: Secondary | ICD-10-CM

## 2015-07-19 DIAGNOSIS — I959 Hypotension, unspecified: Secondary | ICD-10-CM

## 2015-07-19 LAB — CBC WITH DIFFERENTIAL/PLATELET
BASOS PCT: 0.8 % (ref 0.0–3.0)
Basophils Absolute: 0.1 10*3/uL (ref 0.0–0.1)
Eosinophils Absolute: 0.3 10*3/uL (ref 0.0–0.7)
Eosinophils Relative: 3 % (ref 0.0–5.0)
HEMATOCRIT: 35.7 % — AB (ref 36.0–46.0)
Hemoglobin: 11.7 g/dL — ABNORMAL LOW (ref 12.0–15.0)
LYMPHS ABS: 3.4 10*3/uL (ref 0.7–4.0)
LYMPHS PCT: 35.6 % (ref 12.0–46.0)
MCHC: 32.8 g/dL (ref 30.0–36.0)
MCV: 75.5 fl — AB (ref 78.0–100.0)
MONOS PCT: 7.8 % (ref 3.0–12.0)
Monocytes Absolute: 0.7 10*3/uL (ref 0.1–1.0)
NEUTROS ABS: 5 10*3/uL (ref 1.4–7.7)
NEUTROS PCT: 52.8 % (ref 43.0–77.0)
PLATELETS: 385 10*3/uL (ref 150.0–400.0)
RBC: 4.72 Mil/uL (ref 3.87–5.11)
RDW: 16.2 % — ABNORMAL HIGH (ref 11.5–15.5)
WBC: 9.4 10*3/uL (ref 4.0–10.5)

## 2015-07-19 LAB — BASIC METABOLIC PANEL
BUN: 22 mg/dL (ref 6–23)
CALCIUM: 9.7 mg/dL (ref 8.4–10.5)
CO2: 28 mEq/L (ref 19–32)
CREATININE: 0.97 mg/dL (ref 0.40–1.20)
Chloride: 101 mEq/L (ref 96–112)
GFR: 61.08 mL/min (ref >60.00)
Glucose, Bld: 163 mg/dL — ABNORMAL HIGH (ref 70–99)
Potassium: 4.3 mEq/L (ref 3.5–5.1)
Sodium: 138 mEq/L (ref 135–145)

## 2015-07-19 NOTE — Progress Notes (Signed)
Patient ID: Priscilla [name], female   DOB: 1950-11-11, 65 y.o.   MRN: [id number]      Patient ID: Priscilla [name], female  DOB: 1950/12/07, 65 y.o.   MRN: [id number]  Subjective:  Priscilla [name] is a 65 y.o. female present for establishment of care and complaint of hypotension.  All past medical history, surgical history, allergies, family history, immunizations, medications and social history were obtained and updated  in the electronic medical record today. All recent labs, ED visits and hospitalizations within the last year were reviewed.    Hypotension: Patient has been treated for hypertesnion for many years. She states she has been on the same dosage of lisinopril/HCTZ 20-12.5 and metoprolol 25 mg twice a day, since prior to her lap band procedure. She had a LAP-BAND procedure approximately 10 years ago. She has lost approximately 19 pounds in the last 2 years. Patient is a diabetic. She has a history palpitations. She has undergone heart catheter stress Myoview which she states was normal. She does take a baby aspirin. Patient states she has been feeling dizzy upon standing and more generalized fatigue. She endorses that this has been progressing over the last year. She has taken her blood pressure home and the lowest systolic has been in the 0000000, and the lowest diastolic in the 123456. She endorses not drinking adequate water consumption. Her blood glucose levels are "good" at these times. She has had low blood glucoses in the past as well. She denies any falls associated with dizziness or syncope. She denies any chest pain, headache or shortness of breath. She has had no new dietary changes or medication changes. She has been walking more frequently and attempting to exercise more frequently.  Past Medical History  Diagnosis Date  . Diabetes mellitus with neuropathy ([place]) 12/14/2008  . Anxiety and depression 12/14/2008  . Hypertension 12/14/2008  . Hyperlipemia 01/21/2010  . Asthma 11/12/2006    . CAD (coronary artery disease) 12/14/2008    hx transluminal coronary angioplasty  . Leg edema 12/14/2008  . OSA (obstructive sleep apnea) 08/06/2008  . Osteoarthritis 12/14/2008  . Palpitations 11/12/2006    hx sinus tachy  . Anemia 12/14/2008  . Psoriatic arthritis [facility])     sees rheumatology  . Cataract   . Depression   . Fibromyalgia 01/04/2007    sees rheuamtology   No Known Allergies Past Surgical History  Procedure Laterality Date  . Cholecystectomy    . Abdominal hysterectomy    . Laparoscopic gastric banding    . Wrist surgery      LEFT  . Ptca     Family History  Problem Relation Age of Onset  . Other Mother     ALS  . Arthritis Mother   . Stroke Father   . Alcohol abuse Father   . Diabetes Father   . Heart disease Father   . Early death Father 39  . Diabetes Sister   . Arthritis Sister   . COPD Brother   . Arthritis Maternal Aunt   . Diabetes Maternal Aunt   . Early death Maternal Aunt 7  . Diabetes Maternal Uncle   . Cancer Paternal Aunt   . COPD Paternal 74   . Heart disease Paternal Aunt   . Heart disease Paternal Uncle   . Alcohol abuse Paternal Grandfather   . Heart disease Paternal Grandfather   . Early death Paternal Grandfather 75  . Stroke Paternal Grandfather    Social History   Social  History  . Marital Status: Married    Spouse Name: N/A  . Number of Children: N/A  . Years of Education: N/A   Occupational History  . Not on file.   Social History Main Topics  . Smoking status: Never Smoker   . Smokeless tobacco: Never Used  . Alcohol Use: Yes     Comment: glass of wine-specially occasion  . Drug Use: No  . Sexual Activity: No   Other Topics Concern  . Not on file   Social History Narrative   Married, [name]. 2 children name Abe People and Anderson Malta.   Some college, retired Engineer, manufacturing.   Drinks caffeine.   Take a daily vitamin.   Wears her seatbelt, exercises 3 times a week.   Smoke detector in the home.    Firearms in the home.   Feels safe in her relationships.   ROS: Negative, with the exception of above mentioned in HPI  Objective: BP 102/73 mmHg  Pulse 88  Temp(Src) 98.4 F (36.9 C) (Oral)  Resp 20  Ht 5' 3.75" (1.619 m)  Wt 171 lb 8 oz (77.792 kg)  BMI 29.68 kg/m2  SpO2 98% Gen: Afebrile. No acute distress. Nontoxic in appearance, well-developed, well-nourished, female, pleasant female. HENT: AT. [place]. MMM, no oral lesions. Eyes:Pupils Equal Round Reactive to light, Extraocular movements intact,  Conjunctiva without redness, discharge or icterus. Neck/lymp/endocrine: Supple,No  lymphadenopathy, No  thyromegaly CV: RRR, No edema Chest: CTAB, no wheeze, rhonchi or crackles.  Abd: Soft. NTND. BS present. Skin: No rashes, purpura or petechiae. Warm and well-perfused. Skin intact. Neuro/Msk: Normal gait. PERLA. EOMi. Alert. Oriented x3.  Cranial nerves II through XII intact. Psych: Normal affect, dress and demeanor. Normal speech. Normal thought content and judgment.  Assessment/plan: [name] is a 65 y.o. female present for establishment (with new provider in the same system) and hypotension.  Dizziness/hypotension - Patient blood pressure was low on exam today. She was orthostatic negative. - CBC w/Diff - Basic Metabolic Panel (BMET) - Patient encouraged to maintain good hydration.  - Patient is to take half of the metoprolol equaling 12.5 twice a day. If her heart rate is consistently above 100 or her palpitations return she is to increase back to 25 mg twice a day. - Stop lisinopril, discussed with patient ideally we would like to have her at least on a very small dose of lisinopril for kidney protection considering she's a diabetic.  - She is to monitor her blood pressure home to see a weeks worth of blood pressure and heart rate readings with the above changes to decide further management. - Monitor her blood pressure at home if increases above 140/80 routinely, she is  to start pack half tablet of lisinopril daily. - Follow-up in one week with blood pressure/heart rate log   Return in about 1 week (around 07/26/2015), or hypotension (30 m), for Office visit.  Electronically signed by: Howard Pouch, DO [place]

## 2015-07-19 NOTE — Patient Instructions (Signed)
Hypotension As your heart beats, it forces blood through your arteries. This force is your blood pressure. If your blood pressure is too low for you to go about your normal activities or to support the organs of your body, you have hypotension. Hypotension is also referred to as low blood pressure. When your blood pressure becomes too low, you may not get enough blood to your brain. As a result, you may feel weak, feel lightheaded, or develop a rapid heart rate. In a more severe case, you may faint. CAUSES Various conditions can cause hypotension. These include:  Blood loss.  Dehydration.  Heart or endocrine problems.  Pregnancy.  Severe infection.  Not having a well-balanced diet filled with needed nutrients.  Severe allergic reactions (anaphylaxis). Some medicines, such as blood pressure medicine or water pills (diuretics), may lower your blood pressure below normal. Sometimes taking too much medicine or taking medicine not as directed can cause hypotension. TREATMENT  Hospitalization is sometimes required for hypotension if fluid or blood replacement is needed, if time is needed for medicines to wear off, or if further monitoring is needed. Treatment might include changing your diet, changing your medicines (including medicines aimed at raising your blood pressure), and use of support stockings. HOME CARE INSTRUCTIONS   Drink enough fluids to keep your urine clear or pale yellow.  Take your medicines as directed by your health care provider.  Get up slowly from reclining or sitting positions. This gives your blood pressure a chance to adjust.  Wear support stockings as directed by your health care provider.  Maintain a healthy diet by including nutritious food, such as fruits, vegetables, nuts, whole grains, and lean meats. SEEK MEDICAL CARE IF:  You have vomiting or diarrhea.  You have a fever for more than 2-3 days.  You feel more thirsty than usual.  You feel weak and  tired. SEEK IMMEDIATE MEDICAL CARE IF:   You have chest pain or a fast or irregular heartbeat.  You have a loss of feeling in some part of your body, or you lose movement in your arms or legs.  You have trouble speaking.  You become sweaty or feel lightheaded.  You faint. MAKE SURE YOU:   Understand these instructions.  Will watch your condition.  Will get help right away if you are not doing well or get worse.   This information is not intended to replace advice given to you by your health care provider. Make sure you discuss any questions you have with your health care provider.   Document Released: 03/27/2005 Document Revised: 01/15/2013 Document Reviewed: 09/27/2012 Elsevier Interactive Patient Education [employer].  followup in 1 week.  1/2 metoprolol (12.5 every 12 hours) Stop lisinopril/HCTZ. If BP elevated > 140/80 start 1/2 pill daily.

## 2015-07-20 ENCOUNTER — Telehealth: Payer: Self-pay | Admitting: Family Medicine

## 2015-07-20 NOTE — Telephone Encounter (Signed)
Left message on patient voice mail with lab results per [facility]

## 2015-07-20 NOTE — Telephone Encounter (Signed)
Please call patient: Her labs are stable from prior collections. She does have very mild anemia, again this has been stable from her collection 2 years ago.

## 2015-07-26 ENCOUNTER — Ambulatory Visit (INDEPENDENT_AMBULATORY_CARE_PROVIDER_SITE_OTHER): Payer: Medicare Other | Admitting: Family Medicine

## 2015-07-26 ENCOUNTER — Encounter: Payer: Self-pay | Admitting: Family Medicine

## 2015-07-26 DIAGNOSIS — I10 Essential (primary) hypertension: Secondary | ICD-10-CM | POA: Diagnosis not present

## 2015-07-26 MED ORDER — LISINOPRIL 2.5 MG PO TABS: 2.5000 mg | ORAL_TABLET | Freq: Every day | ORAL | Status: DC

## 2015-07-26 MED ORDER — HYDROCHLOROTHIAZIDE 12.5 MG PO CAPS: 12.5000 mg | ORAL_CAPSULE | Freq: Every day | ORAL | Status: DC

## 2015-07-26 NOTE — Progress Notes (Signed)
Patient ID: Priscilla [name], female   DOB: [birth year], 65 y.o.   MRN: [id number]      Patient ID: Priscilla [name], female  DOB: October 27, 1950, 65 y.o.   MRN: [id number]  Subjective:  Priscilla [name] is a 65 y.o. female present for f/u to hypotension.  131/85 Hypotension: Patient states she has had no more dizzy spells, since stopping the lisinopril/HCTZ and 1/2 dosing the metoprolol. She brings with her blood pressure readings, all within normal range (highest 131/85- lowest 108/74) been feeling dizzy upon standing and more generalized fatigue. HR recordings are mostly in the 70-85 range, a few mildly elevated 103 and 95. She denies any falls associated with dizziness or syncope. She denies any chest pain, headache or shortness of breath. She has had no new dietary changes or medication changes. She has been walking more frequently and attempting to exercise more frequently.Patient has been treated for hypertesnion for many years. She states she has been on the same dosage of lisinopril/HCTZ 20-12.5 and metoprolol 25 mg twice a day, since prior to her lap band procedure. She had a LAP-BAND procedure approximately 10 years ago. She has lost approximately 19 pounds in the last 2 years. Patient is a diabetic. She has a history palpitations. She has undergone heart catheter stress Myoview which she states was normal. She does take a baby aspirin.   Past Medical History  Diagnosis Date  . Diabetes mellitus with neuropathy ([place]) 12/14/2008  . Anxiety and depression 12/14/2008  . Hypertension 12/14/2008  . Hyperlipemia 01/21/2010  . Asthma 11/12/2006  . CAD (coronary artery disease) 12/14/2008    hx transluminal coronary angioplasty  . Leg edema 12/14/2008  . OSA (obstructive sleep apnea) 08/06/2008  . Osteoarthritis 12/14/2008  . Palpitations 11/12/2006    hx sinus tachy  . Anemia 12/14/2008  . Psoriatic arthritis [facility])     sees rheumatology  . Cataract   . Depression   . Fibromyalgia 01/04/2007    sees  rheuamtology   No Known Allergies Past Surgical History  Procedure Laterality Date  . Cholecystectomy    . Abdominal hysterectomy    . Laparoscopic gastric banding    . Wrist surgery      LEFT  . Ptca     Family History  Problem Relation Age of Onset  . Other Mother     ALS  . Arthritis Mother   . Stroke Father   . Alcohol abuse Father   . Diabetes Father   . Heart disease Father   . Early death Father 83  . Diabetes Sister   . Arthritis Sister   . COPD Brother   . Arthritis Maternal Aunt   . Diabetes Maternal Aunt   . Early death Maternal Aunt 35  . Diabetes Maternal Uncle   . Cancer Paternal Aunt   . COPD Paternal 51   . Heart disease Paternal Aunt   . Heart disease Paternal Uncle   . Alcohol abuse Paternal Grandfather   . Heart disease Paternal Grandfather   . Early death Paternal Grandfather 34  . Stroke Paternal Grandfather    Social History   Social History  . Marital Status: Married    Spouse Name: N/A  . Number of Children: N/A  . Years of Education: N/A   Occupational History  . Not on file.   Social History Main Topics  . Smoking status: Never Smoker   . Smokeless tobacco: Never Used  . Alcohol Use: Yes     Comment:  glass of wine-specially occasion  . Drug Use: No  . Sexual Activity: No   Other Topics Concern  . Not on file   Social History Narrative   Married, [name]. 2 children name Abe People and Anderson Malta.   Some college, retired Engineer, manufacturing.   Drinks caffeine.   Take a daily vitamin.   Wears her seatbelt, exercises 3 times a week.   Smoke detector in the home.   Firearms in the home.   Feels safe in her relationships.   ROS: Negative, with the exception of above mentioned in HPI  Objective: BP 136/82 mmHg  Pulse 61  Temp(Src) 98 F (36.7 C)  Resp 20  Wt 176 lb 12 oz (80.173 kg)  SpO2 99% Gen: Afebrile. No acute distress. Nontoxic in appearance, well-developed, well-nourished, female, pleasant female. HENT: AT.  [place]. MMM, no oral lesions. Eyes:Pupils Equal Round Reactive to light, Extraocular movements intact,  Conjunctiva without redness, discharge or icterus. CV: RRR, trace edema Chest: CTAB, no wheeze, rhonchi or crackles.   Assessment/plan: Priscilla [name] is a 65 y.o. female present for establishment (with new provider in the same system) and hypotension.  Dizziness/hypotension - Patient blood pressure is now normal. Dizziness resolved.  - added back 2.5 mg of lisinopril for kidney protection (diabetic) if able to tolerate.  - HCTZ added back at 12.5 mg daily for edema and pt counseled on low salt diet. If edema worsens will need to see, today only a trace. Discussed with pt if BP is too low on  daily checks the HCTZ is the first thing she should stop and call us. Could attempt half dose, although not certain the benefit.   -  Continue metoprolol 1/2 pill BID, unless HR consistently above 100 with fatigue or palpitations, then return to full dose and call the office.  - Continue daily BP/HR log and bring to MW visit in 1 week (pt has never had her welcome to medicare visit, and it will be 12 months in May).   > 25 minutes spent with patient, >50% of time spent face to face counseling patient and coordinating care.   Electronically signed by: Howard Pouch, DO [place]

## 2015-07-26 NOTE — Patient Instructions (Signed)
Low-Sodium Eating Plan Sodium raises blood pressure and causes water to be held in the body. Getting less sodium from food will help lower your blood pressure, reduce any swelling, and protect your heart, liver, and kidneys. We get sodium by adding salt (sodium chloride) to food. Most of our sodium comes from canned, boxed, and frozen foods. Restaurant foods, fast foods, and pizza are also very high in sodium. Even if you take medicine to lower your blood pressure or to reduce fluid in your body, getting less sodium from your food is important. WHAT IS MY PLAN? Most people should limit their sodium intake to 2,300 mg a day. Your health care provider recommends that you limit your sodium intake to ___2000_______ a day.  WHAT DO I NEED TO KNOW ABOUT THIS EATING PLAN? For the low-sodium eating plan, you will follow these general guidelines:  Choose foods with a % Daily Value for sodium of less than 5% (as listed on the food label).   Use salt-free seasonings or herbs instead of table salt or sea salt.   Check with your health care provider or pharmacist before using salt substitutes.   Eat fresh foods.  Eat more vegetables and fruits.  Limit canned vegetables. If you do use them, rinse them well to decrease the sodium.   Limit cheese to 1 oz (28 g) per day.   Eat lower-sodium products, often labeled as "lower sodium" or "no salt added."  Avoid foods that contain monosodium glutamate (MSG). MSG is sometimes added to [place] food and some canned foods.  Check food labels (Nutrition Facts labels) on foods to learn how much sodium is in one serving.  Eat more home-cooked food and less restaurant, buffet, and fast food.  When eating at a restaurant, ask that your food be prepared with less salt, or no salt if possible.  HOW DO I READ FOOD LABELS FOR SODIUM INFORMATION? The Nutrition Facts label lists the amount of sodium in one serving of the food. If you eat more than one serving,  you must multiply the listed amount of sodium by the number of servings. Food labels may also identify foods as:  Sodium free--Less than 5 mg in a serving.  Very low sodium--35 mg or less in a serving.  Low sodium--140 mg or less in a serving.  Light in sodium--50% less sodium in a serving. For example, if a food that usually has 300 mg of sodium is changed to become light in sodium, it will have 150 mg of sodium.  Reduced sodium--25% less sodium in a serving. For example, if a food that usually has 400 mg of sodium is changed to reduced sodium, it will have 300 mg of sodium. WHAT FOODS CAN I EAT? Grains Low-sodium cereals, including oats, puffed wheat and rice, and shredded wheat cereals. Low-sodium crackers. Unsalted rice and pasta. Lower-sodium bread.  Vegetables Frozen or fresh vegetables. Low-sodium or reduced-sodium canned vegetables. Low-sodium or reduced-sodium tomato sauce and paste. Low-sodium or reduced-sodium tomato and vegetable juices.  Fruits Fresh, frozen, and canned fruit. Fruit juice.  Meat and Other Protein Products Low-sodium canned tuna and salmon. Fresh or frozen meat, poultry, seafood, and fish. Lamb. Unsalted nuts. Dried beans, peas, and lentils without added salt. Unsalted canned beans. Homemade soups without salt. Eggs.  Dairy Milk. Soy milk. Ricotta cheese. Low-sodium or reduced-sodium cheeses. Yogurt.  Condiments Fresh and dried herbs and spices. Salt-free seasonings. Onion and garlic powders. Low-sodium varieties of mustard and ketchup. Fresh or refrigerated horseradish. [place]  juice.  Fats and Oils Reduced-sodium salad dressings. Unsalted butter.  Other Unsalted popcorn and pretzels.  The items listed above may not be a complete list of recommended foods or beverages. Contact your dietitian for more options. WHAT FOODS ARE NOT RECOMMENDED? Grains Instant hot cereals. Bread stuffing, pancake, and biscuit mixes. Croutons. Seasoned rice or pasta  mixes. Noodle soup cups. Boxed or frozen macaroni and cheese. Self-rising flour. Regular salted crackers. Vegetables Regular canned vegetables. Regular canned tomato sauce and paste. Regular tomato and vegetable juices. Frozen vegetables in sauces. Salted [place] fries. Olives. Angie Fava. Relishes. Sauerkraut. Salsa. Meat and Other Protein Products Salted, canned, smoked, spiced, or pickled meats, seafood, or fish. Bacon, ham, sausage, hot dogs, corned beef, chipped beef, and packaged luncheon meats. Salt pork. Jerky. Pickled herring. Anchovies, regular canned tuna, and sardines. Salted nuts. Dairy Processed cheese and cheese spreads. Cheese curds. Blue cheese and cottage cheese. Buttermilk.  Condiments Onion and garlic salt, seasoned salt, table salt, and sea salt. Canned and packaged gravies. Worcestershire sauce. Tartar sauce. Barbecue sauce. Teriyaki sauce. Soy sauce, including reduced sodium. Steak sauce. Fish sauce. Oyster sauce. Cocktail sauce. Horseradish that you find on the shelf. Regular ketchup and mustard. Meat flavorings and tenderizers. Bouillon cubes. Hot sauce. Tabasco sauce. Marinades. Taco seasonings. Relishes. Fats and Oils Regular salad dressings. Salted butter. Margarine. Ghee. Bacon fat.  Other Potato and tortilla chips. Corn chips and puffs. Salted popcorn and pretzels. Canned or dried soups. Pizza. Frozen entrees and pot pies.  The items listed above may not be a complete list of foods and beverages to avoid. Contact your dietitian for more information.   This information is not intended to replace advice given to you by your health care provider. Make sure you discuss any questions you have with your health care provider.   Document Released: 09/16/2001 Document Revised: 04/17/2014 Document Reviewed: 01/29/2013 Elsevier Interactive Patient Education 2016 Elsevier Inc.   Take BP and record daily until I see you again.  1/2- 1 tab HCTZ daily (if BP can handle and  Edema full tab) 1/2 metoprolol continued BID, unless HR consistently above 100. 2.5 mg lisinopril--> diabetic kidney protection only.

## 2015-07-29 DIAGNOSIS — M25512 Pain in left shoulder: Secondary | ICD-10-CM | POA: Diagnosis not present

## 2015-07-30 ENCOUNTER — Other Ambulatory Visit: Payer: Self-pay | Admitting: Internal Medicine

## 2015-08-05 ENCOUNTER — Ambulatory Visit: Payer: Medicare Other | Admitting: Family Medicine

## 2015-08-06 ENCOUNTER — Ambulatory Visit (INDEPENDENT_AMBULATORY_CARE_PROVIDER_SITE_OTHER): Payer: Medicare Other | Admitting: Family Medicine

## 2015-08-06 ENCOUNTER — Ambulatory Visit: Payer: Medicare Other | Admitting: Family Medicine

## 2015-08-06 ENCOUNTER — Encounter: Payer: Self-pay | Admitting: Family Medicine

## 2015-08-06 VITALS — BP 111/77 | HR 63 | Temp 98.5°F | Resp 20 | Ht 64.0 in | Wt 171.8 lb

## 2015-08-06 DIAGNOSIS — Z0000 Encounter for general adult medical examination without abnormal findings: Secondary | ICD-10-CM | POA: Insufficient documentation

## 2015-08-06 DIAGNOSIS — E2839 Other primary ovarian failure: Secondary | ICD-10-CM | POA: Insufficient documentation

## 2015-08-06 DIAGNOSIS — Z1329 Encounter for screening for other suspected endocrine disorder: Secondary | ICD-10-CM | POA: Insufficient documentation

## 2015-08-06 DIAGNOSIS — Z1159 Encounter for screening for other viral diseases: Secondary | ICD-10-CM | POA: Insufficient documentation

## 2015-08-06 DIAGNOSIS — E785 Hyperlipidemia, unspecified: Secondary | ICD-10-CM

## 2015-08-06 DIAGNOSIS — Z1231 Encounter for screening mammogram for malignant neoplasm of breast: Secondary | ICD-10-CM

## 2015-08-06 MED ORDER — ZOSTER VACCINE LIVE 19400 UNT/0.65ML ~~LOC~~ SUSR: 0.6500 mL | Freq: Once | SUBCUTANEOUS | Status: DC

## 2015-08-06 NOTE — Patient Instructions (Signed)
Advanced directives packet  Given today, please fill this out and return a copy to [place].  Bone density ordered  Hep C , lipid panel, tsh--> make lab appt for fasting labs.  Zoster (sh[place])  printed, please call in and let us know if you  Get this so we can update your chart Mammogram ordered at breast center.  Health Maintenance, Female Adopting a healthy lifestyle and getting preventive care can go a long way to promote health and wellness. Talk with your health care provider about what schedule of regular examinations is right for you. This is a good chance for you to check in with your provider about disease prevention and staying healthy. In between checkups, there are plenty of things you can do on your own. Experts have done a lot of research about which lifestyle changes and preventive measures are most likely to keep you healthy. Ask your health care provider for more information. WEIGHT AND DIET  Eat a healthy diet  Be sure to include plenty of vegetables, fruits, low-fat dairy products, and lean protein.  Do not eat a lot of foods high in solid fats, added sugars, or salt.  Get regular exercise. This is one of the most important things you can do for your health.  Most adults should exercise for at least 150 minutes each week. The exercise should increase your heart rate and make you sweat (moderate-intensity exercise).  Most adults should also do strengthening exercises at least twice a week. This is in addition to the moderate-intensity exercise.  Maintain a healthy weight  Body mass index (BMI) is a measurement that can be used to identify possible weight problems. It estimates body fat based on height and weight. Your health care provider can help determine your BMI and help you achieve or maintain a healthy weight.  For females 54 years of age and older:   A BMI below 18.5 is considered underweight.  A BMI of 18.5 to 24.9 is normal.  A BMI of 25 to 29.9 is considered  overweight.  A BMI of 30 and above is considered obese.  Watch levels of cholesterol and blood lipids  You should start having your blood tested for lipids and cholesterol at 65 years of age, then have this test every 5 years.  You may need to have your cholesterol levels checked more often if:  Your lipid or cholesterol levels are high.  You are older than 66 years of age.  You are at high risk for heart disease.  CANCER SCREENING   Lung Cancer  Lung cancer screening is recommended for adults 60-55 years old who are at high risk for lung cancer because of a history of smoking.  A yearly low-dose CT scan of the lungs is recommended for people who:  Currently smoke.  Have quit within the past 15 years.  Have at least a 30-pack-year history of smoking. A pack year is smoking an average of one pack of cigarettes a day for 1 year.  Yearly screening should continue until it has been 15 years since you quit.  Yearly screening should stop if you develop a health problem that would prevent you from having lung cancer treatment.  Breast Cancer  Practice breast self-awareness. This means understanding how your breasts normally appear and feel.  It also means doing regular breast self-exams. Let your health care provider know about any changes, no matter how small.  If you are in your 20s or 30s, you should have a  clinical breast exam (CBE) by a health care provider every 1-3 years as part of a regular health exam.  If you are 23 or older, have a CBE every year. Also consider having a breast X-ray (mammogram) every year.  If you have a family history of breast cancer, talk to your health care provider about genetic screening.  If you are at high risk for breast cancer, talk to your health care provider about having an MRI and a mammogram every year.  Breast cancer gene (BRCA) assessment is recommended for women who have family members with BRCA-related cancers. BRCA-related  cancers include:  Breast.  Ovarian.  Tubal.  Peritoneal cancers.  Results of the assessment will determine the need for genetic counseling and BRCA1 and BRCA2 testing. Cervical Cancer Your health care provider may recommend that you be screened regularly for cancer of the pelvic organs (ovaries, uterus, and vagina). This screening involves a pelvic examination, including checking for microscopic changes to the surface of your cervix (Pap test). You may be encouraged to have this screening done every 3 years, beginning at age 74.  For women ages 73-65, health care providers may recommend pelvic exams and Pap testing every 3 years, or they may recommend the Pap and pelvic exam, combined with testing for human papilloma virus (HPV), every 5 years. Some types of HPV increase your risk of cervical cancer. Testing for HPV may also be done on women of any age with unclear Pap test results.  Other health care providers may not recommend any screening for nonpregnant women who are considered low risk for pelvic cancer and who do not have symptoms. Ask your health care provider if a screening pelvic exam is right for you.  If you have had past treatment for cervical cancer or a condition that could lead to cancer, you need Pap tests and screening for cancer for at least 20 years after your treatment. If Pap tests have been discontinued, your risk factors (such as having a new sexual partner) need to be reassessed to determine if screening should resume. Some women have medical problems that increase the chance of getting cervical cancer. In these cases, your health care provider may recommend more frequent screening and Pap tests. Colorectal Cancer  This type of cancer can be detected and often prevented.  Routine colorectal cancer screening usually begins at 66 years of age and continues through 66 years of age.  Your health care provider may recommend screening at an earlier age if you have risk  factors for colon cancer.  Your health care provider may also recommend using home test kits to check for hidden blood in the stool.  A small camera at the end of a tube can be used to examine your colon directly (sigmoidoscopy or colonoscopy). This is done to check for the earliest forms of colorectal cancer.  Routine screening usually begins at age 51.  Direct examination of the colon should be repeated every 5-10 years through 66 years of age. However, you may need to be screened more often if early forms of precancerous polyps or small growths are found. Skin Cancer  Check your skin from head to toe regularly.  Tell your health care provider about any new moles or changes in moles, especially if there is a change in a mole's shape or color.  Also tell your health care provider if you have a mole that is larger than the size of a pencil eraser.  Always use sunscreen. Apply sunscreen liberally  and repeatedly throughout the day.  Protect yourself by wearing long sleeves, pants, a wide-brimmed hat, and sunglasses whenever you are outside. HEART DISEASE, DIABETES, AND HIGH BLOOD PRESSURE   High blood pressure causes heart disease and increases the risk of stroke. High blood pressure is more likely to develop in:  People who have blood pressure in the high end of the normal range (130-139/85-89 mm Hg).  People who are overweight or obese.  People who are African American.  If you are 82-75 years of age, have your blood pressure checked every 3-5 years. If you are 31 years of age or older, have your blood pressure checked every year. You should have your blood pressure measured twice--once when you are at a hospital or clinic, and once when you are not at a hospital or clinic. Record the average of the two measurements. To check your blood pressure when you are not at a hospital or clinic, you can use:  An automated blood pressure machine at a pharmacy.  A home blood pressure  monitor.  If you are between 73 years and 82 years old, ask your health care provider if you should take aspirin to prevent strokes.  Have regular diabetes screenings. This involves taking a blood sample to check your fasting blood sugar level.  If you are at a normal weight and have a low risk for diabetes, have this test once every three years after 66 years of age.  If you are overweight and have a high risk for diabetes, consider being tested at a younger age or more often. PREVENTING INFECTION  Hepatitis B  If you have a higher risk for hepatitis B, you should be screened for this virus. You are considered at high risk for hepatitis B if:  You were born in a country where hepatitis B is common. Ask your health care provider which countries are considered high risk.  Your parents were born in a high-risk country, and you have not been immunized against hepatitis B (hepatitis B vaccine).  You have HIV or AIDS.  You use needles to inject street drugs.  You live with someone who has hepatitis B.  You have had sex with someone who has hepatitis B.  You get hemodialysis treatment.  You take certain medicines for conditions, including cancer, organ transplantation, and autoimmune conditions. Hepatitis C  Blood testing is recommended for:  Everyone born from [birth year] through 1966.  Anyone with known risk factors for hepatitis C. Sexually transmitted infections (STIs)  You should be screened for sexually transmitted infections (STIs) including gonorrhea and chlamydia if:  You are sexually active and are younger than 67 years of age.  You are older than 66 years of age and your health care provider tells you that you are at risk for this type of infection.  Your sexual activity has changed since you were last screened and you are at an increased risk for chlamydia or gonorrhea. Ask your health care provider if you are at risk.  If you do not have HIV, but are at risk, it may be  recommended that you take a prescription medicine daily to prevent HIV infection. This is called pre-exposure prophylaxis (PrEP). You are considered at risk if:  You are sexually active and do not regularly use condoms or know the HIV status of your partner(s).  You take drugs by injection.  You are sexually active with a partner who has HIV. Talk with your health care provider about whether you are  at high risk of being infected with HIV. If you choose to begin PrEP, you should first be tested for HIV. You should then be tested every 3 months for as long as you are taking PrEP.  PREGNANCY   If you are premenopausal and you may become pregnant, ask your health care provider about preconception counseling.  If you may become pregnant, take 400 to 800 micrograms (mcg) of folic acid every day.  If you want to prevent pregnancy, talk to your health care provider about birth control (contraception). OSTEOPOROSIS AND MENOPAUSE   Osteoporosis is a disease in which the bones lose minerals and strength with aging. This can result in serious bone fractures. Your risk for osteoporosis can be identified using a bone density scan.  If you are 33 years of age or older, or if you are at risk for osteoporosis and fractures, ask your health care provider if you should be screened.  Ask your health care provider whether you should take a calcium or vitamin D supplement to lower your risk for osteoporosis.  Menopause may have certain physical symptoms and risks.  Hormone replacement therapy may reduce some of these symptoms and risks. Talk to your health care provider about whether hormone replacement therapy is right for you.  HOME CARE INSTRUCTIONS   Schedule regular health, dental, and eye exams.  Stay current with your immunizations.   Do not use any tobacco products including cigarettes, chewing tobacco, or electronic cigarettes.  If you are pregnant, do not drink alcohol.  If you are  breastfeeding, limit how much and how often you drink alcohol.  Limit alcohol intake to no more than 1 drink per day for nonpregnant women. One drink equals 12 ounces of beer, 5 ounces of wine, or 1 ounces of hard liquor.  Do not use street drugs.  Do not share needles.  Ask your health care provider for help if you need support or information about quitting drugs.  Tell your health care provider if you often feel depressed.  Tell your health care provider if you have ever been abused or do not feel safe at home.   This information is not intended to replace advice given to you by your health care provider. Make sure you discuss any questions you have with your health care provider.   Document Released: 10/10/2010 Document Revised: 04/17/2014 Document Reviewed: 02/26/2013 Elsevier Interactive Patient Education [employer].

## 2015-08-06 NOTE — Progress Notes (Signed)
Patient ID: CHENITA WOLIVER, female   DOB: 25-May-1950, 65 y.o.   MRN: [id number] Medicare AWV    History of Present Ilness: [name], @64  y.o. , female presents today for Medicare wellness visit.  Vital Signs: BP 111/77 mmHg  Pulse 63  Temp(Src) 98.5 F (36.9 C) (Oral)  Resp 20  Ht 5\' 4"  (1.626 m)  Wt 171 lb 12 oz (77.905 kg)  BMI 29.47 kg/m2  SpO2 97% List of providers/suppliers:  Updated in pts records (snapshot). Patient Care Team    Relationship Specialty Notifications Start End  Ma Hillock, DO PCP - General Family Medicine  07/19/15    Comment: transfer to [place], MD Consulting Physician Orthopedic Surgery  08/06/15   Philemon Kingdom, MD Consulting Physician Internal Medicine  08/06/15    Comment: endocrine  Mcarthur Rossetti, MD Consulting Physician Orthopedic Surgery  08/06/15   Bo Merino, MD Consulting Physician Rheumatology  08/06/15     Past medical, surgical, family and social histories reviewed (including experiences with illnesses, hospital stays, operations, injuries, and treatments):  Past Medical History  Diagnosis Date  . Diabetes mellitus with neuropathy ([place]) 12/14/2008  . Anxiety and depression 12/14/2008  . Hypertension 12/14/2008  . Hyperlipemia 01/21/2010  . Asthma 11/12/2006  . CAD (coronary artery disease) 12/14/2008    hx transluminal coronary angioplasty  . Leg edema 12/14/2008  . OSA (obstructive sleep apnea) 08/06/2008  . Osteoarthritis 12/14/2008  . Palpitations 11/12/2006    hx sinus tachy  . Anemia 12/14/2008  . Psoriatic arthritis [facility])     sees rheumatology  . Cataract   . Depression   . Fibromyalgia 01/04/2007    sees rheuamtology   All allergies reviewed No Known Allergies Past Surgical History  Procedure Laterality Date  . Cholecystectomy    . Abdominal hysterectomy    . Laparoscopic gastric banding    . Wrist surgery      LEFT  . Ptca     Family History  Problem Relation Age of Onset  . Other  Mother     ALS  . Arthritis Mother   . Stroke Father   . Alcohol abuse Father   . Diabetes Father   . Heart disease Father   . Early death Father 83  . Diabetes Sister   . Arthritis Sister   . COPD Brother   . Arthritis Maternal Aunt   . Diabetes Maternal Aunt   . Early death Maternal Aunt 66  . Diabetes Maternal Uncle   . Cancer Paternal Aunt   . COPD Paternal 40   . Heart disease Paternal Aunt   . Heart disease Paternal Uncle   . Alcohol abuse Paternal Grandfather   . Heart disease Paternal Grandfather   . Early death Paternal Grandfather 48  . Stroke Paternal Grandfather    Social History   Social History Narrative   Married, [name]. 2 children name Abe People and Anderson Malta.   Some college, retired Engineer, manufacturing.   Drinks caffeine.   Take a daily vitamin.   Wears her seatbelt, exercises 3 times a week.   Smoke detector in the home.   Firearms in the home.   Feels safe in her relationships.    All medications verified   Medication List       This list is accurate as of: 08/06/15  1:39 PM.  Always use your most recent med list.  aspirin 81 MG tablet  Take 81 mg by mouth daily.     Biotin 5000 MCG Tabs  Take by mouth.     calcium-vitamin D 250-100 MG-UNIT tablet  Take 1 tablet by mouth 2 (two) times daily.     DULoxetine 60 MG capsule  Commonly known as:  CYMBALTA  TAKE 1 CAPSULE BY MOUTH ONCE DAILY     FREESTYLE LITE test strip  Generic drug:  glucose blood  USE TO TEST BLOOD SUGAR TWICE A DAY     glucagon 1 MG injection  Commonly known as:  GLUCAGON EMERGENCY  Inject 1 mg into the muscle once as needed.     hydrochlorothiazide 12.5 MG capsule  Commonly known as:  MICROZIDE  Take 1 capsule (12.5 mg total) by mouth daily.     insulin aspart 100 UNIT/ML FlexPen  Commonly known as:  NOVOLOG FLEXPEN  Inject 6-8 Units into the skin 3 (three) times daily with meals. Plus sliding scale.     Insulin Detemir 100 UNIT/ML Pen   Commonly known as:  LEVEMIR FLEXTOUCH  Inject 40 Units into the skin at bedtime.     Insulin Pen Needle 32G X 6 MM Misc  Commonly known as:  NOVOFINE  Use with levemir pen as instructed     Insulin Syringe-Needle U-100 27G X 5/8" 1 ML Misc  Commonly known as:  B-D INS SYR MICROFINE 1CC/27G  Use 4 times daily prior to meals and at bedtime     Lancet Device Misc  by Does not apply route daily.     lisinopril 2.5 MG tablet  Commonly known as:  PRINIVIL,ZESTRIL  Take 1 tablet (2.5 mg total) by mouth daily.     metFORMIN 1000 MG tablet  Commonly known as:  GLUCOPHAGE  TAKE 1 TABLET BY MOUTH 2 TIMES DAILY WITH A MEAL.     metoprolol tartrate 25 MG tablet  Commonly known as:  LOPRESSOR  Take 1 tablet (25 mg total) by mouth 2 (two) times daily.     multivitamin tablet  Take 1 tablet by mouth daily.     nitroGLYCERIN 0.4 MG SL tablet  Commonly known as:  NITROSTAT  Place 0.4 mg under the tongue every 5 (five) minutes as needed. Reported on 07/19/2015     pravastatin 40 MG tablet  Commonly known as:  PRAVACHOL  Take 1 tablet (40 mg total) by mouth daily.     PRESCRIPTION MEDICATION  Reported on 05/18/2015     PROAIR HFA 108 (90 Base) MCG/ACT inhaler  Generic drug:  albuterol  inhale 2 puffs by mouth every 6 hours if needed     traZODone 50 MG tablet  Commonly known as:  DESYREL     zolpidem 10 MG tablet  Commonly known as:  AMBIEN  Take 10 mg by mouth at bedtime.         Exercise/Diet: Current Exercise Habits: Home exercise routine, Type of exercise: walking, Time (Minutes): 30, Frequency (Times/Week): 3, Weekly Exercise (Minutes/Week): 90, Intensity: Mild   Diet:   Functional Status Survey: Is the patient deaf or have difficulty hearing?: No Does the patient have difficulty seeing, even when wearing glasses/contacts?: No Does the patient have difficulty concentrating, remembering, or making decisions?: No Does the patient have difficulty walking or climbing  stairs?: No (due to back pain) Does the patient have difficulty dressing or bathing?: No Does the patient have difficulty doing errands alone such as visiting a doctor's office or shopping?: No  Fall Risk  08/06/2015  Falls in the past year? No   Cognitive assessment: No cognitive impairment barriers.   Depression Screening: Depression screen [facility] 2/9 08/06/2015  Decreased Interest 0  Down, Depressed, Hopeless 0  PHQ - 2 Score 0   Advanced Care Planning: all materials provided to pt today, along with instructions.  Code status on file: No  MOST form information and copy given: given to pt Parole information provided: No Code Status: Full code Advanced care directive scanned into chart: No  Health maintenance:  Immunizations: Immunization History  Administered Date(s) Administered  . Influenza Split 01/21/2013  . Influenza, High Dose Seasonal PF 02/04/2015  . Influenza,inj,Quad PF,36+ Mos 01/15/2014  . Pneumococcal Conjugate-13 02/04/2015  . Pneumococcal Polysaccharide-23 10/31/2013  . Tdap 02/07/2013   Shingles vaccination printed for pt to take to pharmacy of desired Info -Pneumococcal (once in a lifetime after age 4); Seasonal Influenza annually; Tetanus ( Tdap ) once every 10 years- not covered by medicare; Zostavax - Shingles vaccine - not covered by Part A or B   Bone mass measurements Date of Study:  04/10/2012 completed.      Info:  USPSTF: "B" recommendation to screen, >2 yr interval advised. Coverage: Medicare patients at risk for developing Osteoporosis. Medicare covers every 24 months or based on previous test.   Cardiovascular Screening Blood Tests Lipid Panel     Component Value Date/Time   CHOL 157 06/05/2014 0858   TRIG 109.0 06/05/2014 0858   HDL 49.70 06/05/2014 0858   CHOLHDL 3 06/05/2014 0858   VLDL 21.8 06/05/2014 0858   LDLCALC 86 06/05/2014 0858   Info:  USPSTF: "C" recommendation for no screening unless patient has risk  factors, "A" recommendation to screen if has risk factors: HTN, DM, ASCVD, Fam Hx of ASCVD in men <50, women <60, Tobacco use, BMI > 30. USPSTF prefers Total Cholesterol and HDL for screening. USPSTF advises every 5 years Coverage: All asymptomic Medicare patients (No fast required for total and HDL measurement. 12-hr fast is required if lipid panel done)  Diabetes Screening Tests--> pt is followed by endocrine for diabetes BMP Latest Ref Rng 07/19/2015 06/05/2014 09/26/2013  Glucose 70 - 99 mg/dL 163(H) 124(H) 96  BUN 6 - 23 mg/dL 22 17 18   Creatinine 0.40 - 1.20 mg/dL 0.97 0.87 1.0  Sodium 135 - 145 mEq/L 138 139 136  Potassium 3.5 - 5.1 mEq/L 4.3 4.5 5.2(H)  Chloride 96 - 112 mEq/L 101 101 101  CO2 19 - 32 mEq/L 28 30 26   Calcium 8.4 - 10.5 mg/dL 9.7 9.4 9.5    Info USPSTF: "B" recommendation to screen if patient has sustained BP > 135/80; ([facility] covers 2 screening tests per year for patient diagnosed with pre-diabetes; 1 screening per year if previously tested, but not diagnosed with pre-diabetes or if never tested) Coverage: - Medicare patients with certain risk factors for diabetes or diagnosed with pre-diabetes (patients previously diagnosed with diabetes aren't eligible for benefit)  Diabetes Self-Mgmt Training and Medical Nutrition Therapy  Date previously done:: Pt is followed by Endocrine.   Info: (Up to 10 hrs of initial training within a continuous 35mo period; subsequent yrs up to 2hrs follow-up training each year after initial year) ;  Coverage: Medicare patients at risk for complications from diabetes, recently diagnosed with diabetes or previously diagnosed with diabetes (must certify DSMT need)   Colorectal Cancer Screening Last Colonoscopy: 10/2008. Normal, Dr. Olevia Perches. Repeat exam in 10 years.   Info USPSTF: "A" for  CRC screening, ages 74-74;  3 screening methods: USPSTF says no method is preferred - Annual high-sensitivity FOBT OR - Flex sig Q 5 y + annual FOBT OR -  Colonoscopy Q 10 y;  Medicare covers:  -Flexible sigmoidoscopy (32yrs, or once every 10 yrs after a screening colonoscopy) -Screening Colonoscopy (every 5 yrs if at high risk; every 10 yrs otherwise) -Fecal Occult Blood Test (annually) or Cologuard q3 years Coverage: Medicare covers for patients age 78 and up - Screening colonoscopy: For those at high risk; no minimum age  Glaucoma Screening  Date of Last ophthalmology evaluation: 12/25/2013--> Scheduled in 5//2017 Done by: [facility]  Info USPSTF: "I" recommendation - Insufficent evidence to recommend for or against screening (Medicare coveres annually for patients in a high risk group);  Coverage: Medicare covers for patients with diabetes mellitus, family history of glaucoma, African-Americans age 23 and over, or 36 age 65 and up  Screening Pap tests and Pelvic Exam Last Pap and Pelvic exam: Not indicated  Info:  USPSTF: "D" recommendation against cervical cancer screening for women > age 47 with adequate screening history not at high risk for cervical cancer.  (Medicare covers annually if high-risk, or childbearing age with abnormal Pap test within past 3 yrs; every 24 months for all other women); Medicare covers for all female Medicare patients  Screening Mammography No fhx, last mammogram 12/25/2013. Bi rads 1, yearly mammograms.   Info:  USPSTF "B" recommendation for mammography every 2 years ages 16-74;  (Medicare covers annually); Medicare covers for all female patients 52 or older  AAA/EKG Screening: completed if indicated and medicare welcome visit.  EKG: normal EKG, normal sinus rhythm, poor R wave progression, with poss. q wave antero-septal leads. Difficulty with EKG today secondary to pt having lotion on her body and leads sticking.    Alcohol abuse screening: completed if indicated  STIs screening: Completed if indicated  Assessment and Plan: mammogram and DEXA ordered today Lipid  screening Advanced directives packet given with instructions.  Hep C , lipid panel, tsh ordered for fasting screenings.  zostavax printed    Education and Counseling Provided:  See after visit summary that was printed and given to patient 1. Tobacco abuse counseling 2. Alcohol abuse counseling 3. STIs counseling 4. Referrals made  Referrals/orders made if indicated: 1. IBT (Intensive Behavior Therapy) for Cardiovascular disease 2. IBT for Obesity 3. MNT (Medical Nutrition Therapy)  4. Behavioral Counseling for Alcohol abuse  5. HIBT (High Intensity Behavioral Counseling) to prevent STIs (Sexually Transmitted Infections) 6. [place] for AAA screening - IPPE only 7. EKG screening- IPPE only 8. Low dose CT- lung cancer screen 9. Pelvic/PAP exam 10. PSA/Prostate 11. Screen mammogram 12. HIV screen 13. Hep C screen 14. Glaucoma screen 15. Diabetes screen 16. Colon cancer screen 17. DEXA  Vaccinations Administered: 1. Influenza 2. PPV23/prevnar 13 3. Hepatitis B 4. Tdap - print script 5. Shingle's vaccination- print script  F/u 1 year  Electronically Signed by: Howard Pouch, DO [place]

## 2015-08-17 DIAGNOSIS — L738 Other specified follicular disorders: Secondary | ICD-10-CM | POA: Diagnosis not present

## 2015-08-17 DIAGNOSIS — I788 Other diseases of capillaries: Secondary | ICD-10-CM | POA: Diagnosis not present

## 2015-08-17 DIAGNOSIS — L918 Other hypertrophic disorders of the skin: Secondary | ICD-10-CM | POA: Diagnosis not present

## 2015-08-17 DIAGNOSIS — D1801 Hemangioma of skin and subcutaneous tissue: Secondary | ICD-10-CM | POA: Diagnosis not present

## 2015-08-17 DIAGNOSIS — L821 Other seborrheic keratosis: Secondary | ICD-10-CM | POA: Diagnosis not present

## 2015-08-17 DIAGNOSIS — L720 Epidermal cyst: Secondary | ICD-10-CM | POA: Diagnosis not present

## 2015-08-25 ENCOUNTER — Other Ambulatory Visit (INDEPENDENT_AMBULATORY_CARE_PROVIDER_SITE_OTHER): Payer: Medicare Other

## 2015-08-25 DIAGNOSIS — E785 Hyperlipidemia, unspecified: Secondary | ICD-10-CM | POA: Diagnosis not present

## 2015-08-25 DIAGNOSIS — Z1159 Encounter for screening for other viral diseases: Secondary | ICD-10-CM

## 2015-08-25 LAB — LIPID PANEL
CHOLESTEROL: 162 mg/dL (ref 0–200)
HDL: 51.3 mg/dL (ref >39.00)
LDL Cholesterol: 86 mg/dL (ref 0–99)
NonHDL: 110.75
TRIGLYCERIDES: 125 mg/dL (ref 0.0–149.0)
Total CHOL/HDL Ratio: 3
VLDL: 25 mg/dL (ref 0.0–40.0)

## 2015-08-26 ENCOUNTER — Telehealth: Payer: Self-pay | Admitting: Family Medicine

## 2015-08-26 LAB — HEPATITIS C ANTIBODY: HCV Ab: NEGATIVE

## 2015-08-26 NOTE — Telephone Encounter (Signed)
Please call pt: - Hep screen negative - cholesterol panel looks beautiful.  Results for orders placed or performed in visit on 08/25/15 (from the past 72 hour(s))  Lipid panel     Status: None   Collection Time: 08/25/15  2:02 PM  Result Value Ref Range   Cholesterol 162 0 - 200 mg/dL    Comment: ATP III Classification       Desirable:  < 200 mg/dL               Borderline High:  200 - 239 mg/dL          High:  > = 240 mg/dL   Triglycerides 125.0 0.0 - 149.0 mg/dL    Comment: Normal:  <150 mg/dLBorderline High:  150 - 199 mg/dL   HDL 51.30 >39.00 mg/dL   VLDL 25.0 0.0 - 40.0 mg/dL   LDL Cholesterol 86 0 - 99 mg/dL   Total CHOL/HDL Ratio 3     Comment:                Men          Women1/2 Average Risk     3.4          3.3Average Risk          5.0          4.42X Average Risk          9.6          7.13X Average Risk          15.0          11.0                       NonHDL 110.75     Comment: NOTE:  Non-HDL goal should be 30 mg/dL higher than patient's LDL goal (i.e. LDL goal of < 70 mg/dL, would have non-HDL goal of < 100 mg/dL)  Hepatitis C Antibody     Status: None   Collection Time: 08/25/15  2:02 PM  Result Value Ref Range   HCV Ab NEGATIVE NEGATIVE

## 2015-08-26 NOTE — Telephone Encounter (Signed)
Left message with lab results on patient voice mail 

## 2015-08-27 ENCOUNTER — Other Ambulatory Visit: Payer: Self-pay | Admitting: Family Medicine

## 2015-08-30 ENCOUNTER — Ambulatory Visit
Admission: RE | Admit: 2015-08-30 | Discharge: 2015-08-30 | Disposition: A | Discharge status: Home / Self Care | Payer: Medicare Other | Source: Ambulatory Visit | Attending: Family Medicine | Admitting: Family Medicine

## 2015-08-30 ENCOUNTER — Telehealth: Payer: Self-pay | Admitting: Family Medicine

## 2015-08-30 DIAGNOSIS — Z780 Asymptomatic menopausal state: Secondary | ICD-10-CM | POA: Diagnosis not present

## 2015-08-30 DIAGNOSIS — Z1231 Encounter for screening mammogram for malignant neoplasm of breast: Secondary | ICD-10-CM

## 2015-08-30 DIAGNOSIS — E2839 Other primary ovarian failure: Secondary | ICD-10-CM

## 2015-08-30 DIAGNOSIS — Z13820 Encounter for screening for osteoporosis: Secondary | ICD-10-CM | POA: Diagnosis not present

## 2015-08-30 NOTE — Telephone Encounter (Signed)
Please advise pt her bone density scan looks great! Continue daily supplements of Ca/Vit d she is currently taking.

## 2015-08-31 NOTE — Telephone Encounter (Signed)
Left message with results and instructions on patient voice mail. 

## 2015-09-01 DIAGNOSIS — E119 Type 2 diabetes mellitus without complications: Secondary | ICD-10-CM | POA: Diagnosis not present

## 2015-09-01 DIAGNOSIS — H524 Presbyopia: Secondary | ICD-10-CM | POA: Diagnosis not present

## 2015-09-01 DIAGNOSIS — H2513 Age-related nuclear cataract, bilateral: Secondary | ICD-10-CM | POA: Diagnosis not present

## 2015-09-01 DIAGNOSIS — H5203 Hypermetropia, bilateral: Secondary | ICD-10-CM | POA: Diagnosis not present

## 2015-09-01 DIAGNOSIS — H40022 Open angle with borderline findings, high risk, left eye: Secondary | ICD-10-CM | POA: Diagnosis not present

## 2015-09-01 DIAGNOSIS — H52223 Regular astigmatism, bilateral: Secondary | ICD-10-CM | POA: Diagnosis not present

## 2015-09-20 DIAGNOSIS — M25512 Pain in left shoulder: Secondary | ICD-10-CM | POA: Diagnosis not present

## 2015-10-05 ENCOUNTER — Ambulatory Visit: Payer: Medicare Other | Admitting: Internal Medicine

## 2015-10-18 ENCOUNTER — Ambulatory Visit (INDEPENDENT_AMBULATORY_CARE_PROVIDER_SITE_OTHER): Payer: Medicare Other | Admitting: Internal Medicine

## 2015-10-18 ENCOUNTER — Encounter: Payer: Self-pay | Admitting: Internal Medicine

## 2015-10-18 VITALS — BP 118/60 | HR 72 | Ht 63.5 in | Wt 171.0 lb

## 2015-10-18 DIAGNOSIS — E11319 Type 2 diabetes mellitus with unspecified diabetic retinopathy without macular edema: Secondary | ICD-10-CM

## 2015-10-18 DIAGNOSIS — E1165 Type 2 diabetes mellitus with hyperglycemia: Secondary | ICD-10-CM

## 2015-10-18 DIAGNOSIS — Z794 Long term (current) use of insulin: Secondary | ICD-10-CM

## 2015-10-18 MED ORDER — INSULIN DETEMIR 100 UNIT/ML FLEXPEN: 35.0000 [IU] | PEN_INJECTOR | Freq: Every day | SUBCUTANEOUS | Status: DC

## 2015-10-18 NOTE — Patient Instructions (Signed)
Please continue: - Metformin 1000 mg 2x a day with meals. - Mealtime Novolog:  - 6 units before a smaller meal - 8 units before a larger meal - Sliding scale Novolog: - 151-175: + 1 unit  - 176-200: + 2 units  - 201-225: + 3 units  - 226-250: + 4 units  - >250: + 5 units If sugars before the meal are 60 or lower, please do not take the Novolog dose. If sugars before the meal are 61-80, take only half of the Novolog dose.  Please decrease: - Levemir to 35 units at night  Please return in 3 months with your sugar log.

## 2015-10-18 NOTE — Progress Notes (Signed)
Patient ID: Priscilla [name], female   DOB: 1950-10-12, 65 y.o.   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 She previously saw Drs Dwyane Dee (distant past) and [place] (more recently). Last visit with me 3.5 mo ago. She will see Dr. Raoul Pitch.  She has partial rotator cuff tear >> will get a new steroid inj in 2 weeks.  Last hemoglobin A1c was: 02/2015: HbA1c 7.4%, fructosamine 237 >> corresponding HbA1c 5.6% Lab Results  Component Value Date   HGBA1C 7.4 05/18/2015   HGBA1C 7.3* 12/10/2014   HGBA1C 8.0* 06/05/2014   She gets steroid inj's - Before last visit.  Pt was on a regimen of: - Metformin 1000 mg 2x a day, with meals - Levemir 50 units in am She was on Victoza 2.5 mg daily >> stopped as she could not afford this. Sugars higher after she stopped. She was also on NovoLog before, which she has at home, but did not restart.  At last visit, we changed to: - Metformin 1000 mg 2x a day with meals. - Levemir 40 units at bedtime. - Mealtime Novolog:  - 6 units before a smaller meal - 8 units before a larger meal - Sliding scale Novolog: - 151-175: + 1 unit  - 176-200: + 2 units  - 201-225: + 3 units  - 226-250: + 4 units  - >250: + 5 units If sugars before the meal are 60 or lower, Please do not take the Novolog dose. If sugars before the meal are 61-80, take only half of the Novolog dose.  Pt checks her sugars 1-2x a day and they are: - am: 160-219, 282 >> 82, 99-132, 172 >> 78-120, 133 - 2h after b'fast: 186-225 >> 175 >> 160, 177 - before lunch: 77, 91 >> 90-133, 217 >> 99 - 2h after lunch: 172, 270 >> 106-129, 222 >> 76 (increased activity), 106, 128 - before dinner: 134, 153 >> 110-134 >> 109-140, 173 (larger lunch) - 2h after dinner: 194 >> 193 >> n/c - bedtime: n/c >> 81-147 >> n/c - nighttime: n/c She passed out from No  lows now. Lowest sugar was 72 >> 81 >> 76; she has hypoglycemia awareness at 70.  Highest sugar was 282 >> 286 >> 177.  Glucometer: Freestyle  - no CKD, last BUN/creatinine:  Lab Results  Component Value Date   BUN 22 07/19/2015   CREATININE 0.97 07/19/2015  ACR (12/2014): 6.9 On Lisinopril. - last set of lipids: Lab Results  Component Value Date   CHOL 162 08/25/2015   HDL 51.30 08/25/2015   LDLCALC 86 08/25/2015   TRIG 125.0 08/25/2015   CHOLHDL 3 08/25/2015  On Pravastatin. - last eye exam was in 08/2015. 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 Will have one in May. - + numbness and tingling in her feet. Last foot exam 12/2014. She has PN.  Last TSH: Lab Results  Component Value Date   TSH 0.86 09/26/2013   ROS: Constitutional: no weight gain/loss, no fatigue, no subjective hyperthermia/hypothermia Eyes: no blurry vision, no xerophthalmia ENT: no sore throat, no nodules palpated in throat, no dysphagia/odynophagia, no hoarseness Cardiovascular: no CP/SOB/palpitations/leg swelling Respiratory: no cough/SOB Gastrointestinal: no N/V/D/C Musculoskeletal: no muscle/joint aches Skin: no rashes Neurological: no tremors/numbness/tingling/dizziness  I reviewed pt's medications, allergies, PMH, social hx, family hx, and changes were documented in the history of present illness. Otherwise, unchanged from my initial visit  note.  Past Medical History  Diagnosis Date  . Diabetes mellitus with neuropathy ([place]) 12/14/2008  . Anxiety and depression 12/14/2008  . Hypertension 12/14/2008  . Hyperlipemia 01/21/2010  . Asthma 11/12/2006  . CAD (coronary artery disease) 12/14/2008    hx transluminal coronary angioplasty  . Leg edema 12/14/2008  . OSA (obstructive sleep apnea) 08/06/2008  . Osteoarthritis 12/14/2008  . Palpitations 11/12/2006    hx sinus tachy  . Anemia 12/14/2008  . Psoriatic arthritis [facility])     sees rheumatology  . Cataract   . Depression   . Fibromyalgia 01/04/2007    sees rheuamtology   Past Surgical  History  Procedure Laterality Date  . Cholecystectomy    . Abdominal hysterectomy    . Laparoscopic gastric banding    . Wrist surgery      LEFT  . Ptca     Social History   Social History  . Marital Status: Married    Spouse Name: N/A  . Number of Children: N/A   Occupational History  . Not on file.   Social History Main Topics  . Smoking status: Never Smoker   . Smokeless tobacco: Never Used  . Alcohol Use: Yes     Comment: glass of wine-specially occasion  . Drug Use: No   Current Outpatient Prescriptions on File Prior to Visit  Medication Sig Dispense Refill  . aspirin 81 MG tablet Take 81 mg by mouth daily.      . Biotin 5000 MCG TABS Take by mouth.    . calcium-vitamin D 250-100 MG-UNIT per tablet Take 1 tablet by mouth 2 (two) times daily.    . DULoxetine (CYMBALTA) 60 MG capsule TAKE 1 CAPSULE BY MOUTH ONCE DAILY 30 capsule 5  . FREESTYLE LITE test strip USE TO TEST BLOOD SUGAR TWICE A DAY 350 each 4  . glucagon (GLUCAGON EMERGENCY) 1 MG injection Inject 1 mg into the muscle once as needed. 1 each 12  . hydrochlorothiazide (MICROZIDE) 12.5 MG capsule Take 1 capsule (12.5 mg total) by mouth daily. 90 capsule 1  . insulin aspart (NOVOLOG FLEXPEN) 100 UNIT/ML FlexPen Inject 6-8 Units into the skin 3 (three) times daily with meals. Plus sliding scale. 15 mL 2  . Insulin Detemir (LEVEMIR FLEXTOUCH) 100 UNIT/ML Pen Inject 40 Units into the skin at bedtime. 15 mL 2  . Insulin Pen Needle (NOVOFINE) 32G X 6 MM MISC Use with levemir pen as instructed 100 each 3  . Insulin Syringe-Needle U-100 (B-D INS SYR MICROFINE 1CC/27G) 27G X 5/8" 1 ML MISC Use 4 times daily prior to meals and at bedtime 120 each 6  . Lancet Device MISC by Does not apply route daily.      Marland Kitchen lisinopril (PRINIVIL,ZESTRIL) 2.5 MG tablet TAKE 1 TABLET BY MOUTH EVERY DAY 30 tablet 5  . metFORMIN (GLUCOPHAGE) 1000 MG tablet TAKE 1 TABLET BY MOUTH 2 TIMES DAILY WITH A MEAL. 60 tablet 2  . metoprolol tartrate  (LOPRESSOR) 25 MG tablet Take 1 tablet (25 mg total) by mouth 2 (two) times daily. (Patient taking differently: Take 12.5 mg by mouth 2 (two) times daily. ) 180 tablet 3  . Multiple Vitamin (MULTIVITAMIN) tablet Take 1 tablet by mouth daily.    . nitroGLYCERIN (NITROSTAT) 0.4 MG SL tablet Place 0.4 mg under the tongue every 5 (five) minutes as needed. Reported on 07/19/2015    . pravastatin (PRAVACHOL) 40 MG tablet Take 1 tablet (40 mg total) by mouth daily. 90 tablet 3  .  PRESCRIPTION MEDICATION Reported on 05/18/2015    . PROAIR HFA 108 (90 BASE) MCG/ACT inhaler inhale 2 puffs by mouth every 6 hours if needed 8.5 g 6  . traZODone (DESYREL) 50 MG tablet     . zolpidem (AMBIEN) 10 MG tablet Take 10 mg by mouth at bedtime.  1  . Zoster Vaccine Live, PF, (ZOSTAVAX) 29562 UNT/0.65ML injection Inject 19,400 Units into the skin once. 1 each 0   No current facility-administered medications on file prior to visit.   No Known Allergies Family History  Problem Relation Age of Onset  . Other Mother     ALS  . Arthritis Mother   . Stroke Father   . Alcohol abuse Father   . Diabetes Father   . Heart disease Father   . Early death Father 26  . Diabetes Sister   . Arthritis Sister   . COPD Brother   . Arthritis Maternal Aunt   . Diabetes Maternal Aunt   . Early death Maternal Aunt 8  . Diabetes Maternal Uncle   . Cancer Paternal Aunt   . COPD Paternal 57   . Heart disease Paternal Aunt   . Heart disease Paternal Uncle   . Alcohol abuse Paternal Grandfather   . Heart disease Paternal Grandfather   . Early death Paternal Grandfather 47  . Stroke Paternal Grandfather    PE: BP 118/60 mmHg  Pulse 72  Ht 5' 3.5" (1.613 m)  Wt 171 lb (77.565 kg)  BMI 29.81 kg/m2  SpO2 96% Body mass index is 29.81 kg/(m^2). Wt Readings from Last 3 Encounters:  10/18/15 171 lb (77.565 kg)  08/06/15 171 lb 12 oz (77.905 kg)  07/26/15 176 lb 12 oz (80.173 kg)   Constitutional: overweight, in NAD Eyes:  PERRLA, EOMI, no exophthalmos ENT: moist mucous membranes, no thyromegaly, no cervical lymphadenopathy Cardiovascular: RRR, No MRG Respiratory: CTA B Gastrointestinal: abdomen soft, NT, ND, BS+ Musculoskeletal: no deformities, strength intact in all 4 Skin: moist, warm, no rashes Neurological: no tremor with outstretched hands, DTR normal in all 4  ASSESSMENT: 1. DM2, insulin-dependent, uncontrolled, with complications - Diabetic retinopathy and left eye - Peripheral neuropathy   PLAN:  1. Patient with long-standing, uncontrolled diabetes, on oral antidiabetic regimen + basal insulin + now bolus insulin, with improved sugars. Will try to decrease Levemir dose.  - reviewed last calc. HbA1c: 5.6% (excellent!) - I advised her to:  Patient Instructions  Please continue: - Metformin 1000 mg 2x a day with meals. - Mealtime Novolog:  - 6 units before a smaller meal - 8 units before a larger meal - Sliding scale Novolog: - 151-175: + 1 unit  - 176-200: + 2 units  - 201-225: + 3 units  - 226-250: + 4 units  - >250: + 5 units If sugars before the meal are 60 or lower, please do not take the Novolog dose. If sugars before the meal are 61-80, take only half of the Novolog dose.  Please decrease: - Levemir to 35 units at night  Please return in 3 months with your sugar log.   - Continue checking sugars at different times of the day - check 2-3 times a day, rotating checks - advised for yearly eye exams >> she is UTD - will check a new fructosamine level - Return to clinic in 3 mo with sugar log   Office Visit on 10/18/2015  Component Date Value Ref Range Status  . Fructosamine 10/18/2015 241  0 - 285 umol/L  Final   Comment: Published reference interval for apparently healthy subjects between age 59 and 52 is 65 - 285 umol/L and in a poorly controlled diabetic population is 228 - 563 umol/L with a mean of 396 umol/L.    HbA1c calculated from the fructosamine is great: 5.7%!

## 2015-10-19 LAB — FRUCTOSAMINE: FRUCTOSAMINE: 241 umol/L (ref 0–285)

## 2015-11-01 DIAGNOSIS — M7542 Impingement syndrome of left shoulder: Secondary | ICD-10-CM | POA: Diagnosis not present

## 2015-11-15 ENCOUNTER — Other Ambulatory Visit: Payer: Self-pay | Admitting: Family Medicine

## 2015-11-15 ENCOUNTER — Other Ambulatory Visit: Payer: Self-pay | Admitting: Internal Medicine

## 2015-11-16 ENCOUNTER — Other Ambulatory Visit: Payer: Self-pay | Admitting: Orthopedic Surgery

## 2015-11-16 DIAGNOSIS — M25512 Pain in left shoulder: Secondary | ICD-10-CM

## 2015-12-07 ENCOUNTER — Ambulatory Visit
Admission: RE | Admit: 2015-12-07 | Discharge: 2015-12-07 | Disposition: A | Discharge status: Home / Self Care | Payer: Medicare Other | Source: Ambulatory Visit | Attending: Orthopedic Surgery | Admitting: Orthopedic Surgery

## 2015-12-07 DIAGNOSIS — M25512 Pain in left shoulder: Secondary | ICD-10-CM

## 2015-12-07 DIAGNOSIS — M7582 Other shoulder lesions, left shoulder: Secondary | ICD-10-CM | POA: Diagnosis not present

## 2015-12-07 MED ORDER — IOPAMIDOL (ISOVUE-M 200) INJECTION 41%
15.0000 mL | Freq: Once | INTRAMUSCULAR | Status: AC
  Administered 2015-12-07: 15 mL via INTRA_ARTICULAR

## 2016-01-07 ENCOUNTER — Other Ambulatory Visit: Payer: Self-pay

## 2016-01-07 ENCOUNTER — Telehealth: Payer: Self-pay | Admitting: Internal Medicine

## 2016-01-07 MED ORDER — INSULIN ASPART 100 UNIT/ML FLEXPEN: 6.0000 [IU] | PEN_INJECTOR | Freq: Three times a day (TID) | SUBCUTANEOUS | 2 refills | Status: DC

## 2016-01-07 NOTE — Telephone Encounter (Signed)
Pt needs novolog [facility] [address] please

## 2016-01-18 ENCOUNTER — Ambulatory Visit: Payer: Medicare Other | Admitting: Internal Medicine

## 2016-01-23 ENCOUNTER — Encounter (HOSPITAL_COMMUNITY): Payer: Self-pay

## 2016-01-23 ENCOUNTER — Emergency Department (HOSPITAL_COMMUNITY)
Admission: EM | Admit: 2016-01-23 | Discharge: 2016-01-23 | Disposition: A | Discharge status: Home / Self Care | Payer: Medicare Other | Attending: Emergency Medicine | Admitting: Emergency Medicine

## 2016-01-23 ENCOUNTER — Emergency Department (HOSPITAL_COMMUNITY): Payer: Medicare Other

## 2016-01-23 DIAGNOSIS — M48061 Spinal stenosis, lumbar region without neurogenic claudication: Secondary | ICD-10-CM | POA: Diagnosis not present

## 2016-01-23 DIAGNOSIS — M545 Low back pain: Secondary | ICD-10-CM

## 2016-01-23 DIAGNOSIS — Z7982 Long term (current) use of aspirin: Secondary | ICD-10-CM | POA: Diagnosis not present

## 2016-01-23 DIAGNOSIS — E1140 Type 2 diabetes mellitus with diabetic neuropathy, unspecified: Secondary | ICD-10-CM | POA: Insufficient documentation

## 2016-01-23 DIAGNOSIS — I10 Essential (primary) hypertension: Secondary | ICD-10-CM | POA: Insufficient documentation

## 2016-01-23 DIAGNOSIS — E11319 Type 2 diabetes mellitus with unspecified diabetic retinopathy without macular edema: Secondary | ICD-10-CM | POA: Insufficient documentation

## 2016-01-23 DIAGNOSIS — I2510 Atherosclerotic heart disease of native coronary artery without angina pectoris: Secondary | ICD-10-CM | POA: Insufficient documentation

## 2016-01-23 DIAGNOSIS — Z7984 Long term (current) use of oral hypoglycemic drugs: Secondary | ICD-10-CM | POA: Insufficient documentation

## 2016-01-23 DIAGNOSIS — Z794 Long term (current) use of insulin: Secondary | ICD-10-CM | POA: Insufficient documentation

## 2016-01-23 DIAGNOSIS — G8929 Other chronic pain: Secondary | ICD-10-CM

## 2016-01-23 DIAGNOSIS — M25551 Pain in right hip: Secondary | ICD-10-CM | POA: Diagnosis not present

## 2016-01-23 DIAGNOSIS — J45909 Unspecified asthma, uncomplicated: Secondary | ICD-10-CM | POA: Diagnosis not present

## 2016-01-23 DIAGNOSIS — M4807 Spinal stenosis, lumbosacral region: Secondary | ICD-10-CM | POA: Diagnosis not present

## 2016-01-23 LAB — URINALYSIS, ROUTINE W REFLEX MICROSCOPIC
BILIRUBIN URINE: NEGATIVE
GLUCOSE, UA: NEGATIVE mg/dL
Hgb urine dipstick: NEGATIVE
KETONES UR: NEGATIVE mg/dL
NITRITE: NEGATIVE
PH: 6 (ref 5.0–8.0)
PROTEIN: NEGATIVE mg/dL
Specific Gravity, Urine: 1.015 (ref 1.005–1.030)

## 2016-01-23 LAB — URINE MICROSCOPIC-ADD ON

## 2016-01-23 MED ORDER — MORPHINE SULFATE (PF) 4 MG/ML IV SOLN
4.0000 mg | Freq: Once | INTRAVENOUS | Status: AC
  Administered 2016-01-23: 4 mg via INTRAMUSCULAR
  Filled 2016-01-23: qty 1

## 2016-01-23 MED ORDER — METHYLPREDNISOLONE 4 MG PO TBPK: ORAL_TABLET | ORAL | 0 refills | Status: DC

## 2016-01-23 MED ORDER — CYCLOBENZAPRINE HCL 5 MG PO TABS: 5.0000 mg | ORAL_TABLET | Freq: Two times a day (BID) | ORAL | 0 refills | Status: DC | PRN

## 2016-01-23 MED ORDER — KETOROLAC TROMETHAMINE 60 MG/2ML IM SOLN
60.0000 mg | Freq: Once | INTRAMUSCULAR | Status: AC
  Administered 2016-01-23: 60 mg via INTRAMUSCULAR
  Filled 2016-01-23: qty 2

## 2016-01-23 MED ORDER — HYDROCODONE-ACETAMINOPHEN 5-325 MG PO TABS: 1.0000 | ORAL_TABLET | Freq: Four times a day (QID) | ORAL | 0 refills | Status: DC | PRN

## 2016-01-23 MED ORDER — NAPROXEN 375 MG PO TABS: 375.0000 mg | ORAL_TABLET | Freq: Two times a day (BID) | ORAL | 0 refills | Status: DC | PRN

## 2016-01-23 NOTE — ED Triage Notes (Signed)
Pt reports she has chronic back pain and spinal stenosis. She reports that over the last 24 hours the pain in her right hip has increased and she now has to use walker. Does not normally use an assistive device.

## 2016-01-23 NOTE — ED Notes (Signed)
Pt to xray

## 2016-01-23 NOTE — ED Provider Notes (Signed)
[facility] DEPT Provider Note   CSN: [id number] Arrival date & time: 01/23/16  1202     History   Chief Complaint Chief Complaint  Patient presents with  . Hip Pain    HPI Priscilla [name] is a 65 y.o. female.  HPI 65 year old female with past medical history of hypertension, hyperlipidemia, psoriatic arthritis, and chronic lower back pain with spinal stenosis who presents with right hip pain. The patient states that her symptoms started approximately 24 hours ago. On the day prior to the onset, she had gotten her hair done and was sitting and extended back position for several hours. She went home and felt well but upon awakening the next morning, has had a persistent, aching, throbbing right lower back pain that radiates to her groin. The pain is made worse with sitting upright and laying back and is improved with leaning forward. She denies any associated numbness or weakness of the lower extremity. Denies any preceding trauma or falls. Denies any urinary frequency or dysuria. She subsequent presents for evaluation. Pain feels similar to her chronic pain chest worsened. Denies any bowel or bladder incontinence.  Past Medical History:  Diagnosis Date  . Anemia 12/14/2008  . Anxiety and depression 12/14/2008  . Asthma 11/12/2006  . CAD (coronary artery disease) 12/14/2008   hx transluminal coronary angioplasty  . Cataract   . Depression   . Diabetes mellitus with neuropathy ([place]) 12/14/2008  . Fibromyalgia 01/04/2007   sees rheuamtology  . Hyperlipemia 01/21/2010  . Hypertension 12/14/2008  . Leg edema 12/14/2008  . OSA (obstructive sleep apnea) 08/06/2008  . Osteoarthritis 12/14/2008  . Palpitations 11/12/2006   hx sinus tachy  . Psoriatic arthritis [facility])    sees rheumatology    Patient Active Problem List   Diagnosis Date Noted  . Medicare annual wellness visit, initial 08/06/2015  . Need for hepatitis C screening test 08/06/2015  . Thyroid disorder screen 08/06/2015  . Encounter  for screening mammogram for malignant neoplasm of breast 08/06/2015  . Estrogen deficiency 08/06/2015  . Medicare welcome exam 08/06/2015  . Dizziness 07/19/2015  . Hypotension 07/19/2015  . Uncontrolled type 2 diabetes mellitus with retinopathy, with long-term current use of insulin ([place]) 05/18/2015  . Anxiety and depression 06/25/2014  . Psoriatic arthritis - sees rheumatologist 02/20/2014  . ASTHMA 12/14/2008  . EDEMA, ANKLES 12/14/2008  . PERIPHERAL NEUROPATHY 08/06/2008  . FIBROMYALGIA 08/06/2008  . OSTEOARTHRITIS 12/05/2006  . Hyperlipemia 11/12/2006  . Essential hypertension 11/12/2006    Past Surgical History:  Procedure Laterality Date  . ABDOMINAL HYSTERECTOMY    . CHOLECYSTECTOMY    . LAPAROSCOPIC GASTRIC BANDING    . PTCA    . WRIST SURGERY     LEFT    OB History    No data available       Home Medications    Prior to Admission medications   Medication Sig Start Date End Date Taking? Authorizing Provider  aspirin 81 MG tablet Take 81 mg by mouth daily.      Historical Provider, MD  Biotin 5000 MCG TABS Take by mouth.    Historical Provider, MD  calcium-vitamin D 250-100 MG-UNIT per tablet Take 1 tablet by mouth 2 (two) times daily.    Historical Provider, MD  cyclobenzaprine (FLEXERIL) 5 MG tablet Take 1 tablet (5 mg total) by mouth 2 (two) times daily as needed for muscle spasms. 01/23/16   Duffy Bruce, MD  DULoxetine (CYMBALTA) 60 MG capsule TAKE 1 CAPSULE BY MOUTH ONCE DAILY  Marletta Lor, MD  FREESTYLE LITE test strip USE TO TEST BLOOD SUGAR TWICE A DAY 06/09/14   Marletta Lor, MD  glucagon (GLUCAGON EMERGENCY) 1 MG injection Inject 1 mg into the muscle once as needed. 05/18/15   Philemon Kingdom, MD  hydrochlorothiazide (MICROZIDE) 12.5 MG capsule Take 1 capsule (12.5 mg total) by mouth daily. 07/26/15   Renee A Kuneff, DO  HYDROcodone-acetaminophen (NORCO/VICODIN) 5-325 MG tablet Take 1 tablet by mouth every 6 (six) hours as needed for  severe pain. 01/23/16   Duffy Bruce, MD  insulin aspart (NOVOLOG FLEXPEN) 100 UNIT/ML FlexPen Inject 6-8 Units into the skin 3 (three) times daily with meals. Plus sliding scale. 01/07/16   Philemon Kingdom, MD  Insulin Detemir (LEVEMIR FLEXTOUCH) 100 UNIT/ML Pen Inject 35 Units into the skin at bedtime. 10/18/15   Philemon Kingdom, MD  Insulin Pen Needle (NOVOFINE) 32G X 6 MM MISC Use with levemir pen as instructed 10/31/13   Lucretia Kern, DO  Insulin Syringe-Needle U-100 (B-D INS SYR MICROFINE 1CC/27G) 27G X 5/8" 1 ML MISC Use 4 times daily prior to meals and at bedtime 08/26/13   Marletta Lor, MD  Lancet Device MISC by Does not apply route daily.      Historical Provider, MD  lisinopril (PRINIVIL,ZESTRIL) 2.5 MG tablet TAKE 1 TABLET BY MOUTH EVERY DAY 08/27/15   Renee A Kuneff, DO  metFORMIN (GLUCOPHAGE) 1000 MG tablet TAKE 1 TABLET BY MOUTH 2 TIMES DAILY WITH A MEAL 11/15/15   Philemon Kingdom, MD  methylPREDNISolone (MEDROL DOSEPAK) 4 MG TBPK tablet Take as described on package. 01/23/16   Duffy Bruce, MD  metoprolol tartrate (LOPRESSOR) 25 MG tablet TAKE 1 TABLET BY MOUTH 2 TIMES DAILY 11/15/15   Lucretia Kern, DO  Multiple Vitamin (MULTIVITAMIN) tablet Take 1 tablet by mouth daily.    Historical Provider, MD  naproxen (NAPROSYN) 375 MG tablet Take 1 tablet (375 mg total) by mouth 2 (two) times daily as needed for moderate pain. 01/23/16   Duffy Bruce, MD  nitroGLYCERIN (NITROSTAT) 0.4 MG SL tablet Place 0.4 mg under the tongue every 5 (five) minutes as needed. Reported on 07/19/2015 08/04/10   Marletta Lor, MD  pravastatin (PRAVACHOL) 40 MG tablet Take 1 tablet (40 mg total) by mouth daily. 02/04/15   Lucretia Kern, DO  PRESCRIPTION MEDICATION Reported on 05/18/2015    Historical Provider, MD  PROAIR HFA 108 [phone] BASE) MCG/ACT inhaler inhale 2 puffs by mouth every 6 hours if needed 06/27/11   Marletta Lor, MD  traZODone (DESYREL) 50 MG tablet  07/30/15   Historical Provider, MD    zolpidem (AMBIEN) 10 MG tablet Take 10 mg by mouth at bedtime. 07/05/15   Historical Provider, MD  Zoster Vaccine Live, PF, (ZOSTAVAX) 60454 UNT/0.65ML injection Inject 19,400 Units into the skin once. 08/06/15   Renee A Raoul Pitch, DO    Family History Family History  Problem Relation Age of Onset  . Other Mother     ALS  . Arthritis Mother   . Stroke Father   . Alcohol abuse Father   . Diabetes Father   . Heart disease Father   . Early death Father 12  . Diabetes Sister   . Arthritis Sister   . COPD Brother   . Arthritis Maternal Aunt   . Diabetes Maternal Aunt   . Early death Maternal Aunt 46  . Diabetes Maternal Uncle   . Cancer Paternal Aunt   . COPD Paternal 43   .  Heart disease Paternal Aunt   . Heart disease Paternal Uncle   . Alcohol abuse Paternal Grandfather   . Heart disease Paternal Grandfather   . Early death Paternal Grandfather 2  . Stroke Paternal Grandfather     Social History Social History  Substance Use Topics  . Smoking status: Never Smoker  . Smokeless tobacco: Never Used  . Alcohol use Yes     Comment: glass of wine-specially occasion     Allergies   Review of patient's allergies indicates no known allergies.   Review of Systems Review of Systems  Constitutional: Negative for chills, fatigue and fever.  HENT: Negative for congestion and rhinorrhea.   Eyes: Negative for visual disturbance.  Respiratory: Negative for cough, shortness of breath and wheezing.   Cardiovascular: Negative for chest pain and leg swelling.  Gastrointestinal: Negative for abdominal pain, diarrhea, nausea and vomiting.  Genitourinary: Negative for dysuria and flank pain.  Musculoskeletal: Positive for back pain and gait problem. Negative for neck pain and neck stiffness.  Skin: Negative for rash and wound.  Allergic/Immunologic: Negative for immunocompromised state.  Neurological: Negative for syncope, weakness and headaches.  All other systems reviewed and are  negative.    Physical Exam Updated Vital Signs BP 140/71   Pulse 60   Temp 98.5 F (36.9 C) (Oral)   Resp 16   Ht 5\' 3"  (1.6 m)   Wt 170 lb (77.1 kg)   SpO2 96%   BMI 30.11 kg/m   Physical Exam  Constitutional: She is oriented to person, place, and time. She appears well-developed and well-nourished. No distress.  HENT:  Head: Normocephalic and atraumatic.  Eyes: Conjunctivae are normal.  Neck: Neck supple.  Cardiovascular: Normal rate, regular rhythm and normal heart sounds.  Exam reveals no friction rub.   No murmur heard. Pulmonary/Chest: Effort normal and breath sounds normal. No respiratory distress. She has no wheezes. She has no rales.  Abdominal: Soft. Bowel sounds are normal. She exhibits no distension.  Musculoskeletal: She exhibits no edema.  No tenderness to bilateral hips. No bruising or deformity. No erythema. Negative straight leg test bilaterally.  Neurological: She is alert and oriented to person, place, and time. She exhibits normal muscle tone.  Skin: Skin is warm. Capillary refill takes less than 2 seconds.  Psychiatric: She has a normal mood and affect.  Nursing note and vitals reviewed.   Spine Exam: Inspection/Palpation: Mild tenderness and right lower paraspinal area. No midline tenderness. No bruising or deformity. No spasm. No step-offs. No tenderness to palpation over right greater trochanter with full, painless range of motion at hip. Strength: 5/5 throughout LE bilaterally (hip flexion/extension, adduction/abduction; knee flexion/extension; foot dorsiflexion/plantarflexion, inversion/eversion; great toe inversion) Sensation: Intact to light touch in proximal and distal LE bilaterally Reflexes: 2+ quadriceps and achilles reflexes   ED Treatments / Results  Labs (all labs ordered are listed, but only abnormal results are displayed) Labs Reviewed  URINALYSIS, ROUTINE W REFLEX MICROSCOPIC (NOT AT [facility]) - Abnormal; Notable for the following:        Result Value   Color, Urine AMBER (*)    APPearance CLOUDY (*)    Leukocytes, UA SMALL (*)    All other components within normal limits  URINE MICROSCOPIC-ADD ON - Abnormal; Notable for the following:    Squamous Epithelial / LPF 0-5 (*)    Bacteria, UA MANY (*)    All other components within normal limits    EKG  EKG Interpretation None  Radiology Dg Lumbar Spine Complete  Result Date: 01/23/2016 CLINICAL DATA:  Initial evaluation for the chronic low back pain. EXAM: LUMBAR SPINE - COMPLETE 4+ VIEW COMPARISON:  Prior MRI from 10/31/2014. FINDINGS: Vertebral bodies normally aligned with preservation of the normal lumbar lordosis. The vertebral body heights maintained. No acute fracture or subluxation. Mild to moderate multilevel degenerative spondylolysis with intervertebral disc space narrowing present at L1-2 and L4-5. The SI joints approximated. No acute soft tissue abnormality. Cholecystectomy clips noted. Gastric lap band noted as well. IMPRESSION: 1. No radiographic evidence for acute abnormality within the lumbar spine. 2. Mild to moderate multilevel degenerative spondylolysis, greatest at L1-2 and L4-5. Electronically Signed   By: Jeannine Boga M.D.   On: 01/23/2016 17:32   Dg Hip Unilat  With Pelvis 2-3 Views Right  Result Date: 01/23/2016 CLINICAL DATA:  Acute right hip pain for 1 day. No known injury. Initial encounter. EXAM: DG HIP (WITH OR WITHOUT PELVIS) 2-3V RIGHT COMPARISON:  None. FINDINGS: There is no evidence of hip fracture or dislocation. There is no evidence of arthropathy or other focal bone abnormality. IMPRESSION: Negative. Electronically Signed   By: Margarette Canada M.D.   On: 01/23/2016 13:26    Procedures Procedures (including critical care time)  Medications Ordered in ED Medications  morphine 4 MG/ML injection 4 mg (4 mg Intramuscular Given 01/23/16 1646)  ketorolac (TORADOL) injection 60 mg (60 mg Intramuscular Given 01/23/16 1646)      Initial Impression / Assessment and Plan / ED Course  I have reviewed the triage vital signs and the nursing notes.  Pertinent labs & imaging results that were available during my care of the patient were reviewed by me and considered in my medical decision making (see chart for details).  Clinical Course  65 year old female with past medical history of psoriatic arthritis and chronic spinal stenosis or presents with right back and hip pain. No trauma. On exam, patient has moderate paraspinal tenderness reproduced with weightbearing but not with straight leg raise. I suspect patient has likely acute on chronic spinal stenosis was possible mild radiculopathy. No loss of bowel or bladder function, perianal anesthesia, flexion suggest cauda equina or acute cord compression. She's had no fevers, chills, or infectious symptoms to suggest osteomyelitis or epidural abscess. Will check screening plain films as well as urinalysis given area pain, and give IM pain control. Suspect patient can be discharged if imaging and urinalysis was negative.  Plain films negative for any acute fracture or malalignment. Urinalysis shows no evidence of UTI. Patient's symptoms are markedly improved. Will treat for likely acute on chronic spinal stenosis and discharge home.  Final Clinical Impressions(s) / ED Diagnoses   Final diagnoses:  Spinal stenosis of lumbosacral region  Chronic right-sided low back pain without sciatica    New Prescriptions Discharge Medication List as of 01/23/2016  6:31 PM    START taking these medications   Details  cyclobenzaprine (FLEXERIL) 5 MG tablet Take 1 tablet (5 mg total) by mouth 2 (two) times daily as needed for muscle spasms., Starting Sun 01/23/2016, Print    HYDROcodone-acetaminophen (NORCO/VICODIN) 5-325 MG tablet Take 1 tablet by mouth every 6 (six) hours as needed for severe pain., Starting Sun 01/23/2016, Print    methylPREDNISolone (MEDROL DOSEPAK) 4 MG TBPK  tablet Take as described on package., Print    naproxen (NAPROSYN) 375 MG tablet Take 1 tablet (375 mg total) by mouth 2 (two) times daily as needed for moderate pain., Starting Sun 01/23/2016, Print  Duffy Bruce, MD 01/24/16 [phone]

## 2016-01-25 ENCOUNTER — Encounter (INDEPENDENT_AMBULATORY_CARE_PROVIDER_SITE_OTHER): Payer: Self-pay

## 2016-01-25 ENCOUNTER — Ambulatory Visit: Payer: Medicare Other | Admitting: Internal Medicine

## 2016-01-31 ENCOUNTER — Ambulatory Visit (INDEPENDENT_AMBULATORY_CARE_PROVIDER_SITE_OTHER): Payer: Medicare Other | Admitting: Physical Medicine and Rehabilitation

## 2016-01-31 VITALS — BP 129/80 | HR 79 | Temp 98.0°F

## 2016-01-31 DIAGNOSIS — M47816 Spondylosis without myelopathy or radiculopathy, lumbar region: Secondary | ICD-10-CM | POA: Diagnosis not present

## 2016-01-31 DIAGNOSIS — M25551 Pain in right hip: Secondary | ICD-10-CM | POA: Diagnosis not present

## 2016-01-31 NOTE — Progress Notes (Signed)
Office Visit Note  Patient: Priscilla [name]           Date of Birth: [birth year]           MRN: [id number] Visit Date: 01/31/2016              Requested by: Ma Hillock, DO [address] [place],  [address] PCP: Howard Pouch, DO   Assessment & Plan: Visit Diagnoses:  1. Spondylosis without myelopathy or radiculopathy, lumbar region   2. Pain in right hip     Follow-Up Instructions: The patient will follow up with Dr. Marlou Sa should her hip pain returned. As far as her low back pain she'll follow-up with [place] as needed. We will plan on completing radiofrequency ablation at some point if the injections were lasting very long.  Orders:  No orders of the defined types were placed in this encounter.   No orders of the defined types were placed in this encounter.     Procedures: No notes on file   Clinical Data: No additional findings.   Subjective: Chief Complaint  Patient presents with  . Left Hip - Pain  . Lower Back - Pain    HPI 01/20/16 had to go to ER due to extreme pain right groin area with unable to bear weight. Put on steroid dose pack. Pain much better now than it was. Still feels occ. Twinge if moves wrong.   No blood thinners and has driver-William Obriant-son  She reports good relief more than 80% relief with prior lumbar facet joint injections back in January. She actually had new onset of more right hip and one pain recently. Since she was getting her hair done and was sitting in a chair for a long time and after that had excruciating hip pain. She went to the emergency room and received what sounds like a Toradol and steroid injections generically. She actually did well after that and is really doing okay at this point. She has some stiffness in the right hip and painful range of motion but otherwise doing okay. Her back pain is still status quo and doing okay. We did review radiofrequency ablation with her.  Review of Systems  Constitutional:  Negative for chills, fatigue, fever and unexpected weight change.  HENT: Negative for sore throat and trouble swallowing.   Eyes: Negative for photophobia and visual disturbance.  Respiratory: Negative for chest tightness and shortness of breath.   Cardiovascular: Negative for chest pain.  Gastrointestinal: Negative for abdominal pain.  Endocrine: Negative for cold intolerance and heat intolerance.  Musculoskeletal: Negative for myalgias.  Skin: Negative for color change and rash.  Neurological: Negative for speech difficulty and headaches.  Psychiatric/Behavioral: Negative for confusion. The patient is not nervous/anxious.   All other systems reviewed and are negative.    Objective: Vital Signs: BP 129/80 (BP Location: Right Arm, Patient Position: Sitting, Cuff Size: Normal)   Pulse 79   Temp 98 F (36.7 C)   SpO2 100%    Physical Exam General appearance: NAD, conversant  Psych: Appropriate affect, alert and oriented to person, place and time  Eyes: anicteric sclerae, moist conjunctivae; no lid-lag; PERRLA HENT: Atraumatic; oropharynx clear with moist mucous membranes and no mucosal ulcerations Neck: Trachea midline; FROM, supple, no thyromegaly or lymphadenopathy Lungs: normal respiratory effort and no intercostal retractions, no wheezing CVA: normal pulses, RRR Extremities: No peripheral edema or extremity lymphadenopathy Skin: Normal temperature, turgor and texture; no rash, ulcers or subcutaneous nodules MSK:/Neuro:  The patient ambulates without aid with a normal gait.  They have no pain with hip internal or external rotation.  There is no pain with extension rotation of the lumbar spine. They have no pain over the greater trochanters or PSIS.  On manual muscle testing there is 5/5 strength in all muscle groups of the lower extremities bilaterally without deficits.    Ortho Exam  Specialty Comments:  No specialty comments available. Imaging: Dg Lumbar Spine  Complete  Result Date: 01/23/2016 CLINICAL DATA:  Initial evaluation for the chronic low back pain. EXAM: LUMBAR SPINE - COMPLETE 4+ VIEW COMPARISON:  Prior MRI from 10/31/2014. FINDINGS: Vertebral bodies normally aligned with preservation of the normal lumbar lordosis. The vertebral body heights maintained. No acute fracture or subluxation. Mild to moderate multilevel degenerative spondylolysis with intervertebral disc space narrowing present at L1-2 and L4-5. The SI joints approximated. No acute soft tissue abnormality. Cholecystectomy clips noted. Gastric lap band noted as well. IMPRESSION: 1. No radiographic evidence for acute abnormality within the lumbar spine. 2. Mild to moderate multilevel degenerative spondylolysis, greatest at L1-2 and L4-5. Electronically Signed   By: Jeannine Boga M.D.   On: 01/23/2016 17:32   Dg Hip Unilat  With Pelvis 2-3 Views Right  Result Date: 01/23/2016 CLINICAL DATA:  Acute right hip pain for 1 day. No known injury. Initial encounter. EXAM: DG HIP (WITH OR WITHOUT PELVIS) 2-3V RIGHT COMPARISON:  None. FINDINGS: There is no evidence of hip fracture or dislocation. There is no evidence of arthropathy or other focal bone abnormality. IMPRESSION: Negative. Electronically Signed   By: Margarette Canada M.D.   On: 01/23/2016 13:26     PMFS History: Patient Active Problem List   Diagnosis Date Noted  . Spondylosis without myelopathy or radiculopathy, lumbar region 01/31/2016  . Medicare annual wellness visit, initial 08/06/2015  . Need for hepatitis C screening test 08/06/2015  . Thyroid disorder screen 08/06/2015  . Encounter for screening mammogram for malignant neoplasm of breast 08/06/2015  . Estrogen deficiency 08/06/2015  . Medicare welcome exam 08/06/2015  . Dizziness 07/19/2015  . Hypotension 07/19/2015  . Uncontrolled type 2 diabetes mellitus with retinopathy, with long-term current use of insulin ([place]) 05/18/2015  . Anxiety and depression 06/25/2014   . Psoriatic arthritis - sees rheumatologist 02/20/2014  . ASTHMA 12/14/2008  . EDEMA, ANKLES 12/14/2008  . PERIPHERAL NEUROPATHY 08/06/2008  . FIBROMYALGIA 08/06/2008  . OSTEOARTHRITIS 12/05/2006  . Hyperlipemia 11/12/2006  . Essential hypertension 11/12/2006   Past Medical History:  Diagnosis Date  . Anemia 12/14/2008  . Anxiety and depression 12/14/2008  . Asthma 11/12/2006  . CAD (coronary artery disease) 12/14/2008   hx transluminal coronary angioplasty  . Cataract   . Depression   . Diabetes mellitus with neuropathy ([place]) 12/14/2008  . Fibromyalgia 01/04/2007   sees rheuamtology  . Hyperlipemia 01/21/2010  . Hypertension 12/14/2008  . Leg edema 12/14/2008  . OSA (obstructive sleep apnea) 08/06/2008  . Osteoarthritis 12/14/2008  . Palpitations 11/12/2006   hx sinus tachy  . Psoriatic arthritis [facility])    sees rheumatology    Family History  Problem Relation Age of Onset  . Other Mother     ALS  . Arthritis Mother   . Stroke Father   . Alcohol abuse Father   . Diabetes Father   . Heart disease Father   . Early death Father 40  . Diabetes Sister   . Arthritis Sister   . COPD Brother   . Arthritis Maternal Aunt   .  Diabetes Maternal Aunt   . Early death Maternal Aunt 34  . Diabetes Maternal Uncle   . Cancer Paternal Aunt   . COPD Paternal 14   . Heart disease Paternal Aunt   . Heart disease Paternal Uncle   . Alcohol abuse Paternal Grandfather   . Heart disease Paternal Grandfather   . Early death Paternal Grandfather 50  . Stroke Paternal Grandfather    Past Surgical History:  Procedure Laterality Date  . ABDOMINAL HYSTERECTOMY    . CHOLECYSTECTOMY    . LAPAROSCOPIC GASTRIC BANDING    . PTCA    . WRIST SURGERY     LEFT   Social History   Occupational History  . Not on file.   Social History Main Topics  . Smoking status: Never Smoker  . Smokeless tobacco: Never Used  . Alcohol use Yes     Comment: glass of wine-specially occasion  . Drug use: No  . Sexual  activity: No

## 2016-02-01 ENCOUNTER — Other Ambulatory Visit: Payer: Self-pay | Admitting: Internal Medicine

## 2016-02-01 ENCOUNTER — Other Ambulatory Visit: Payer: Self-pay | Admitting: Family Medicine

## 2016-02-09 ENCOUNTER — Ambulatory Visit (INDEPENDENT_AMBULATORY_CARE_PROVIDER_SITE_OTHER): Payer: Medicare Other

## 2016-02-09 DIAGNOSIS — Z23 Encounter for immunization: Secondary | ICD-10-CM | POA: Diagnosis not present

## 2016-03-09 ENCOUNTER — Other Ambulatory Visit: Payer: Self-pay | Admitting: Rheumatology

## 2016-03-10 NOTE — Telephone Encounter (Signed)
06/08/15 last visit Next visit 03/15/16 Ok to refill per Dr Estanislado Pandy

## 2016-03-14 DIAGNOSIS — L408 Other psoriasis: Secondary | ICD-10-CM | POA: Insufficient documentation

## 2016-03-14 DIAGNOSIS — M19049 Primary osteoarthritis, unspecified hand: Secondary | ICD-10-CM | POA: Insufficient documentation

## 2016-03-14 DIAGNOSIS — G4709 Other insomnia: Secondary | ICD-10-CM | POA: Insufficient documentation

## 2016-03-14 DIAGNOSIS — M5030 Other cervical disc degeneration, unspecified cervical region: Secondary | ICD-10-CM | POA: Insufficient documentation

## 2016-03-15 ENCOUNTER — Ambulatory Visit (INDEPENDENT_AMBULATORY_CARE_PROVIDER_SITE_OTHER): Payer: Medicare Other

## 2016-03-15 ENCOUNTER — Ambulatory Visit (INDEPENDENT_AMBULATORY_CARE_PROVIDER_SITE_OTHER): Payer: Self-pay

## 2016-03-15 ENCOUNTER — Ambulatory Visit (INDEPENDENT_AMBULATORY_CARE_PROVIDER_SITE_OTHER): Payer: Medicare Other | Admitting: Rheumatology

## 2016-03-15 ENCOUNTER — Encounter: Payer: Self-pay | Admitting: Rheumatology

## 2016-03-15 VITALS — BP 122/70 | HR 66 | Resp 14 | Ht 64.0 in | Wt 172.0 lb

## 2016-03-15 DIAGNOSIS — M25571 Pain in right ankle and joints of right foot: Secondary | ICD-10-CM | POA: Diagnosis not present

## 2016-03-15 DIAGNOSIS — M25562 Pain in left knee: Secondary | ICD-10-CM | POA: Diagnosis not present

## 2016-03-15 DIAGNOSIS — L4050 Arthropathic psoriasis, unspecified: Secondary | ICD-10-CM

## 2016-03-15 DIAGNOSIS — G4709 Other insomnia: Secondary | ICD-10-CM | POA: Diagnosis not present

## 2016-03-15 DIAGNOSIS — G8929 Other chronic pain: Secondary | ICD-10-CM

## 2016-03-15 DIAGNOSIS — L408 Other psoriasis: Secondary | ICD-10-CM

## 2016-03-15 DIAGNOSIS — M79642 Pain in left hand: Secondary | ICD-10-CM | POA: Diagnosis not present

## 2016-03-15 DIAGNOSIS — M19041 Primary osteoarthritis, right hand: Secondary | ICD-10-CM

## 2016-03-15 DIAGNOSIS — M25572 Pain in left ankle and joints of left foot: Secondary | ICD-10-CM

## 2016-03-15 DIAGNOSIS — M25561 Pain in right knee: Secondary | ICD-10-CM

## 2016-03-15 DIAGNOSIS — M19042 Primary osteoarthritis, left hand: Secondary | ICD-10-CM | POA: Diagnosis not present

## 2016-03-15 DIAGNOSIS — M79641 Pain in right hand: Secondary | ICD-10-CM

## 2016-03-15 DIAGNOSIS — M5030 Other cervical disc degeneration, unspecified cervical region: Secondary | ICD-10-CM

## 2016-03-15 MED ORDER — ZOLPIDEM TARTRATE 10 MG PO TABS: 10.0000 mg | ORAL_TABLET | Freq: Every day | ORAL | 5 refills | Status: DC

## 2016-03-15 MED ORDER — DULOXETINE HCL 60 MG PO CPEP: ORAL_CAPSULE | ORAL | 5 refills | Status: DC

## 2016-03-15 NOTE — Progress Notes (Signed)
Office Visit Note  Patient: Priscilla [name]             Date of Birth: [birth year]           MRN: [id number]             PCP: Howard Pouch, DO Referring: Ma Hillock, DO Visit Date: 03/15/2016 Occupation: @GUAROCC @    Subjective:  No chief complaint on file. Follow-up Psoriatic arthritis and psoriasis and fibromyalgia and osteoarthritis  History of Present Illness: Priscilla [name] is a 65 y.o. female   Last seen Fabry 20 11/28/2015 Although patient has psoriasis and psoriatic arthritis she does not need any DMARD or Biologics at this time. She has minor psoriasis lesions at this time. Her psoriatic arthritis is also well controlled. On x-rays done October 2016, joints were overall well preserved.  She is only on fibromyalgia medication including Cymbalta and Ambien  One of her main current problems at this time is her left shoulder joint has decreased range of motion. She is currently seeing Dr. Marlou Sa for decreased range of motion of her left shoulder joint secondary to rotator cuff tear. There are considering surgery for the patient and patient is contemplating the best course of action.  Activities of Daily Living:  Patient reports morning stiffness for 15 minutes.   Patient Reports nocturnal pain.  Difficulty dressing/grooming: Reports Difficulty climbing stairs: Reports Difficulty getting out of chair: Reports Difficulty using hands for taps, buttons, cutlery, and/or writing: Reports   Review of Systems  Constitutional: Negative for fatigue.  HENT: Negative for mouth sores and mouth dryness.   Eyes: Negative for dryness.  Respiratory: Negative for shortness of breath.   Gastrointestinal: Negative for constipation and diarrhea.  Musculoskeletal: Negative for myalgias and myalgias.  Skin: Negative for sensitivity to sunlight.  Psychiatric/Behavioral: Negative for decreased concentration and sleep disturbance.    PMFS History:  Patient Active Problem List   Diagnosis Date Noted  . Other psoriasis 03/14/2016  . Other insomnia 03/14/2016  . DDD (degenerative disc disease), cervical 03/14/2016  . 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 Spondylosis without myelopathy or radiculopathy, lumbar region 01/31/2016  . Medicare annual wellness visit, initial 08/06/2015  . Need for hepatitis C screening test 08/06/2015  . Thyroid disorder screen 08/06/2015  . Encounter for screening mammogram for malignant neoplasm of breast 08/06/2015  . Estrogen deficiency 08/06/2015  . Medicare welcome exam 08/06/2015  . Dizziness 07/19/2015  . Hypotension 07/19/2015  . Uncontrolled type 2 diabetes mellitus with retinopathy, with long-term current use of insulin ([place]) 05/18/2015  . Anxiety and depression 06/25/2014  . Psoriatic arthritis - sees rheumatologist 02/20/2014  . ASTHMA 12/14/2008  . EDEMA, ANKLES 12/14/2008  . PERIPHERAL NEUROPATHY 08/06/2008  . FIBROMYALGIA 08/06/2008  . OSTEOARTHRITIS 12/05/2006  . Hyperlipemia 11/12/2006  . Essential hypertension 11/12/2006    Past Medical History:  Diagnosis Date  . Anemia 12/14/2008  . Anxiety and depression 12/14/2008  . Asthma 11/12/2006  . CAD (coronary artery disease) 12/14/2008   hx transluminal coronary angioplasty  . Cataract   . Depression   . Diabetes mellitus with neuropathy ([place]) 12/14/2008  . Fibromyalgia 01/04/2007   sees rheuamtology  . Hyperlipemia 01/21/2010  . Hypertension 12/14/2008  . Leg edema 12/14/2008  . OSA (obstructive sleep apnea) 08/06/2008  . Osteoarthritis 12/14/2008  . Palpitations 11/12/2006   hx sinus tachy  . Psoriatic arthritis [facility])    sees rheumatology  . Rotator cuff injury     Family History  Problem Relation  Age of Onset  . Other Mother     ALS  . Arthritis Mother   . Stroke Father   . Alcohol abuse Father   . Diabetes Father   . Heart disease Father   . Early death Father 50  . Diabetes Sister   . Arthritis Sister   . COPD Brother   . Arthritis Maternal Aunt   .  Diabetes Maternal Aunt   . Early death Maternal Aunt 37  . Diabetes Maternal Uncle   . Cancer Paternal Aunt   . COPD Paternal 45   . Heart disease Paternal Aunt   . Heart disease Paternal Uncle   . Alcohol abuse Paternal Grandfather   . Heart disease Paternal Grandfather   . Early death Paternal Grandfather 12  . Stroke Paternal Grandfather    Past Surgical History:  Procedure Laterality Date  . ABDOMINAL HYSTERECTOMY    . CHOLECYSTECTOMY    . LAPAROSCOPIC GASTRIC BANDING    . PTCA    . WRIST SURGERY     LEFT   Social History   Social History Narrative   Married, [name]. 2 children name Abe People and Anderson Malta.   Some college, retired Engineer, manufacturing.   Drinks caffeine.   Take a daily vitamin.   Wears her seatbelt, exercises 3 times a week.   Smoke detector in the home.   Firearms in the home.   Feels safe in her relationships.     Objective: Vital Signs: BP 122/70 (BP Location: Right Arm, Patient Position: Sitting, Cuff Size: Large)   Pulse 66   Resp 14   Ht 5\' 4"  (1.626 m)   Wt 172 lb (78 kg)   BMI 29.52 kg/m    Physical Exam  Constitutional: She is oriented to person, place, and time. She appears well-developed and well-nourished.  HENT:  Head: Normocephalic and atraumatic.  Eyes: EOM are normal. Pupils are equal, round, and reactive to light.  Cardiovascular: Normal rate, regular rhythm and normal heart sounds.  Exam reveals no gallop and no friction rub.   No murmur heard. Pulmonary/Chest: Effort normal and breath sounds normal. She has no wheezes. She has no rales.  Abdominal: Soft. Bowel sounds are normal. She exhibits no distension. There is no tenderness. There is no guarding. No hernia.  Musculoskeletal: Normal range of motion. She exhibits no edema, tenderness or deformity.  Lymphadenopathy:    She has no cervical adenopathy.  Neurological: She is alert and oriented to person, place, and time. Coordination normal.  Skin: Skin is warm and  dry. Capillary refill takes less than 2 seconds. No rash noted.  Psychiatric: She has a normal mood and affect. Her behavior is normal.     Musculoskeletal Exam:  Decreased range of motion of left shoulder joint secondary to rotator cuff tear. Good range of motion of right shoulder joint. Good range of motion of other joints Grip strength is equal and strong bilaterally Fibromyalgia tender points are all absent today.  CDAI Exam: CDAI Homunculus Exam:   Tenderness:  Right hand: 2nd MCP  Swelling:  Right hand: 2nd MCP  Joint Counts:  CDAI Tender Joint count: 1 CDAI Swollen Joint count: 1  Global Assessments:  Patient Global Assessment: 5 Provider Global Assessment: 5  CDAI Calculated Score: 12  Right second MCP joint has synovial thickening but no pain to palpation. Her fiber myalgia discomfort is rated 5 on a scale of 0-10. Her psoriatic arthritis is not an issue for her at this time. Note  that she is not any kind of DMARD's.  Investigation: No additional findings.   Imaging: Xr Foot 2 Views Left  Result Date: 03/15/2016 Some DIP PIP joint space narrowing Well-preserved MTPs joint space. Well-preserved spacing in the Intertarsal Joint spaces. No erosions.  Xr Foot 2 Views Right  Result Date: 03/15/2016 Some DIP PIP joint space narrowing Well-preserved MTPs Well-preserved spacing in the Intertarsal Joint spaces. No erosions. Mild spurring of right heel  Xr Hand 2 View Left  Result Date: 03/15/2016 Moderate DIP PIP joint space narrowing Moderate MCP narrowing. Normal intracarpal joints spacing Some radiocarpal joint space narrowing No erosions  Xr Hand 2 View Right  Result Date: 03/15/2016 Moderate DIP PIP joint space narrowing Moderate MCP narrowing except fourth MCP has good joints spacing. Normal intracarpal joints spacing Some radiocarpal joint space narrowing No erosions  Xr Knee 3 View Left  Result Date: 03/15/2016 Left knee shows medial compartment  narrowing that's almost severe. No CPPD. Moderate patellofemoral joint space narrowing   Xr Knee 3 View Right  Result Date: 03/15/2016 Right knee shows medial compartment narrowing that's almost severe. No CPPD. Moderate patellofemoral joint space narrowing    Speciality Comments: No specialty comments available.    Procedures:  No procedures performed Allergies: Patient has no known allergies.   Assessment / Plan:     Visit Diagnoses: Psoriatic arthritis  - Plan: XR Foot 2 Views Right, XR Foot 2 Views Left, XR KNEE 3 VIEW LEFT, XR KNEE 3 VIEW RIGHT, XR Hand 2 View Right, XR Hand 2 View Left  Other psoriasis  Other insomnia  DDD (degenerative disc disease), cervical  Primary osteoarthritis of both hands  Bilateral hand pain - Plan: XR Hand 2 View Right, XR Hand 2 View Left  Chronic pain of both knees - Plan: XR KNEE 3 VIEW LEFT, XR KNEE 3 VIEW RIGHT  Chronic pain of both ankles - Plan: XR Foot 2 Views Right, XR Foot 2 Views Left   I discussed x-ray results with the patient at length. She would benefit from Visco supplementation in the future. She is agreeable. She will need cortisone injection in one of her knees prior to moving forward with Visco supplementation.  Advised patient to get inserts for both shoes from the shoe market. Patient states that she had orthotics many years ago and did well but she has not replaced them.  Patient has a history of having a bone density done approximately 3-4 years ago. She states that the results were normal. She does not know exactly how long go was but she thinks it might have been done at [facility] She will get [place] a copy of that report. We'll be happy to treat/reorder the DEXA if appropriate.  Patient knows to let us know if she starts having swelling and redness warmth to her MCP joints MTP joints etc. Currently she is not on any treatment for her psoriasis/sorted arthritis. Consider doing ultrasound of bilateral hands in the future  if she has a flare.  Return to clinic in 5 months  Orders: Orders Placed This Encounter  Procedures  . XR Foot 2 Views Right  . XR Foot 2 Views Left  . XR KNEE 3 VIEW LEFT  . XR KNEE 3 VIEW RIGHT  . XR Hand 2 View Right  . XR Hand 2 View Left   Meds ordered this encounter  Medications  . DULoxetine (CYMBALTA) 60 MG capsule    Sig: TAKE 1 CAPSULE BY MOUTH EVERY DAY  Dispense:  30 capsule    Refill:  5    Order Specific Question:   Supervising Provider    Answer:   Bo Merino [[id number]]  . zolpidem (AMBIEN) 10 MG tablet    Sig: Take 1 tablet (10 mg total) by mouth at bedtime.    Dispense:  30 tablet    Refill:  5    Order Specific Question:   Supervising Provider    Answer:   Bo Merino [phone]    Face-to-face time spent with patient was 40 minutes. 50% of time was spent in counseling and coordination of care.  Follow-Up Instructions: Return in about 5 months (around 08/13/2016).   Eliezer Lofts, PA-C   I examined and evaluated the patient with Eliezer Lofts PA. The plan of care was discussed as noted above.  Bo Merino, MD

## 2016-03-16 ENCOUNTER — Encounter: Payer: Self-pay | Admitting: Internal Medicine

## 2016-03-16 ENCOUNTER — Ambulatory Visit (INDEPENDENT_AMBULATORY_CARE_PROVIDER_SITE_OTHER): Payer: Medicare Other | Admitting: Internal Medicine

## 2016-03-16 VITALS — BP 141/80 | HR 72 | Wt 169.0 lb

## 2016-03-16 DIAGNOSIS — Z794 Long term (current) use of insulin: Secondary | ICD-10-CM

## 2016-03-16 DIAGNOSIS — E1165 Type 2 diabetes mellitus with hyperglycemia: Secondary | ICD-10-CM | POA: Diagnosis not present

## 2016-03-16 DIAGNOSIS — E11319 Type 2 diabetes mellitus with unspecified diabetic retinopathy without macular edema: Secondary | ICD-10-CM | POA: Diagnosis not present

## 2016-03-16 NOTE — Progress Notes (Signed)
Patient ID: Priscilla [name], female   DOB: [birth year], 65 y.o.   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 She previously saw Drs Priscilla [name] (distant past) and Priscilla [name] (more recently). Last visit with me 5 mo ago.  She had a steroid shot for spinal stenosis and another injection for rotator cuff tear. She is not walking now b/c pain. Sugars are higher.  Last hemoglobin A1c was: 10/18/2015: HbA1c calculated from fructosamine: 5.7% 02/2015: HbA1c 7.4%, HbA1c calculated from fructosamine: 5.6% Lab Results  Component Value Date   HGBA1C 7.4 05/18/2015   HGBA1C 7.3 (A) 12/10/2014   HGBA1C 8.0 (H) 06/05/2014   She gets steroid inj's - Before last visit.  Pt was on a regimen of: - Metformin 1000 mg 2x a day, with meals - Levemir 50 units in am She was on Victoza 2.5 mg daily >> stopped as she could not afford this. Sugars higher after she stopped. She was also on NovoLog before, which she has at home, but did not restart.  At last visit, we changed to: - Metformin 1000 mg 2x a day with meals. - Levemir 40 >> 35 >> 40 units at bedtime. - Mealtime Novolog:  - 6 units before a smaller meal - 8 units before a larger meal, but 10 before dinner - Sliding scale Novolog: - 151-175: + 1 unit  - 176-200: + 2 units  - 201-225: + 3 units  - 226-250: + 4 units  - >250: + 5 units If sugars before the meal are 60 or lower, Please do not take the Novolog dose. If sugars before the meal are 61-80, take only half of the Novolog dose.  Pt checks her sugars 2-3x a day and they are: - am: 160-219, 282 >> 82, 99-132, 172 >> 78-120, 133 >> 103-136, 159 - 2h after b'fast: 186-225 >> 175 >> 160, 177 >> 95-144, 151 - before lunch: 77, 91 >> 90-133, 217 >> 99 >> 131, 149 - 2h after lunch: 172, 270 >> 106-129, 222 >> 76 (increased activity), 106, 128 >> 145,  185 - before dinner: 134, 153 >> 110-134 >> 109-140, 173 (larger lunch) >> 116-147 - 2h after dinner: 194 >> 193 >> n/c >> 125-152, 270  - bedtime: n/c >> 81-147 >> n/c >> 118-206, 252 - nighttime: n/c She passed out from No lows now. Lowest sugar was 72 >> 81 >> 76 >> 103; she has hypoglycemia awareness at 70.  Highest sugar was 282 >> 286 >> 177 >> 270.Marland Kitchen  Glucometer: Freestyle  - no CKD, last BUN/creatinine:  Lab Results  Component Value Date   BUN 22 07/19/2015   CREATININE 0.97 07/19/2015  ACR (12/2014): 6.9 On Lisinopril. - last set of lipids: Lab Results  Component Value Date   CHOL 162 08/25/2015   HDL 51.30 08/25/2015   LDLCALC 86 08/25/2015   TRIG 125.0 08/25/2015   CHOLHDL 3 08/25/2015  On Pravastatin. - last eye exam was in 08/2015. 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  - + numbness and tingling in her feet. Last foot exam 12/2014. She has PN.  ROS: Constitutional: no weight gain/loss, no fatigue, no subjective hyperthermia/hypothermia Eyes: no blurry vision, no xerophthalmia ENT: no sore throat, no nodules palpated in throat, no dysphagia/odynophagia, no hoarseness Cardiovascular: no CP/SOB/palpitations/leg swelling Respiratory: no cough/SOB Gastrointestinal: no N/V/D/C Musculoskeletal: no muscle/+ joint aches Skin: no rashes Neurological: no tremors/numbness/tingling/dizziness  I reviewed pt's medications, allergies, PMH, social hx, family hx, and changes were documented in the history of present illness. Otherwise, unchanged from my initial visit note.  Past Medical History:  Diagnosis Date  . Anemia 12/14/2008  . Anxiety and depression 12/14/2008  . Asthma 11/12/2006  . CAD (coronary artery disease) 12/14/2008   hx transluminal coronary angioplasty  . Cataract   . Depression   . Diabetes mellitus with neuropathy ([place]) 12/14/2008  . Fibromyalgia 01/04/2007   sees rheuamtology  . Hyperlipemia 01/21/2010  . Hypertension 12/14/2008  . Leg edema 12/14/2008  . OSA (obstructive sleep apnea)  08/06/2008  . Osteoarthritis 12/14/2008  . Palpitations 11/12/2006   hx sinus tachy  . Psoriatic arthritis [facility])    sees rheumatology  . Rotator cuff injury    Past Surgical History:  Procedure Laterality Date  . ABDOMINAL HYSTERECTOMY    . CHOLECYSTECTOMY    . LAPAROSCOPIC GASTRIC BANDING    . PTCA    . WRIST SURGERY     LEFT   Social History   Social History  . Marital Status: Married    Spouse Name: N/A  . Number of Children: N/A   Occupational History  . Not on file.   Social History Main Topics  . Smoking status: Never Smoker   . Smokeless tobacco: Never Used  . Alcohol Use: Yes     Comment: glass of wine-specially occasion  . Drug Use: No   Current Outpatient Prescriptions on File Prior to Visit  Medication Sig Dispense Refill  . aspirin 81 MG tablet Take 81 mg by mouth daily.      . Biotin 5000 MCG TABS Take by mouth.    . calcium-vitamin D 250-100 MG-UNIT per tablet Take 1 tablet by mouth 2 (two) times daily.    . cyclobenzaprine (FLEXERIL) 5 MG tablet Take 1 tablet (5 mg total) by mouth 2 (two) times daily as needed for muscle spasms. 20 tablet 0  . DULoxetine (CYMBALTA) 60 MG capsule TAKE 1 CAPSULE BY MOUTH EVERY DAY 30 capsule 5  . FREESTYLE LITE test strip USE TO TEST BLOOD SUGAR TWICE A DAY 350 each 4  . glucagon (GLUCAGON EMERGENCY) 1 MG injection Inject 1 mg into the muscle once as needed. 1 each 12  . insulin aspart (NOVOLOG FLEXPEN) 100 UNIT/ML FlexPen Inject 6-8 Units into the skin 3 (three) times daily with meals. Plus sliding scale. 15 mL 2  . Insulin Detemir (LEVEMIR FLEXTOUCH) 100 UNIT/ML Pen Inject 35 Units into the skin at bedtime. 15 mL 2  . Insulin Pen Needle (NOVOFINE) 32G X 6 MM MISC Use with levemir pen as instructed 100 each 3  . Insulin Syringe-Needle U-100 (B-D INS SYR MICROFINE 1CC/27G) 27G X 5/8" 1 ML MISC Use 4 times daily prior to meals and at bedtime 120 each 6  . Lancet Device MISC by Does not apply route daily.      Marland Kitchen lisinopril  (PRINIVIL,ZESTRIL) 2.5 MG tablet TAKE 1 TABLET BY MOUTH EVERY DAY 30 tablet 0  . metFORMIN (GLUCOPHAGE) 1000 MG tablet TAKE 1 TABLET BY MOUTH 2 TIMES DAILY WITH A MEAL 60 tablet 1  . methylPREDNISolone (MEDROL DOSEPAK) 4 MG TBPK tablet Take as described on package. 21 tablet 0  . metoprolol tartrate (LOPRESSOR) 25 MG tablet TAKE 1 TABLET BY MOUTH 2 TIMES DAILY 180 tablet 0  . Multiple Vitamin (MULTIVITAMIN) tablet Take 1 tablet by mouth daily.    . naproxen (NAPROSYN) 375 MG tablet Take 1 tablet (  375 mg total) by mouth 2 (two) times daily as needed for moderate pain. 20 tablet 0  . nitroGLYCERIN (NITROSTAT) 0.4 MG SL tablet Place 0.4 mg under the tongue every 5 (five) minutes as needed. Reported on 07/19/2015    . PRESCRIPTION MEDICATION Reported on 05/18/2015    . PROAIR HFA 108 (90 BASE) MCG/ACT inhaler inhale 2 puffs by mouth every 6 hours if needed 8.5 g 6  . zolpidem (AMBIEN) 10 MG tablet Take 1 tablet (10 mg total) by mouth at bedtime. 30 tablet 5  . Zoster Vaccine Live, PF, (ZOSTAVAX) 13086 UNT/0.65ML injection Inject 19,400 Units into the skin once. 1 each 0   No current facility-administered medications on file prior to visit.    No Known Allergies Family History  Problem Relation Age of Onset  . Other Mother     ALS  . Arthritis Mother   . Stroke Father   . Alcohol abuse Father   . Diabetes Father   . Heart disease Father   . Early death Father 38  . Diabetes Sister   . Arthritis Sister   . COPD Brother   . Arthritis Maternal Aunt   . Diabetes Maternal Aunt   . Early death Maternal Aunt 4  . Diabetes Maternal Uncle   . Cancer Paternal Aunt   . COPD Paternal 35   . Heart disease Paternal Aunt   . Heart disease Paternal Uncle   . Alcohol abuse Paternal Grandfather   . Heart disease Paternal Grandfather   . Early death Paternal Grandfather 66  . Stroke Paternal Grandfather    PE: BP (!) 141/80   Pulse 72   Wt 169 lb (76.7 kg)   BMI 29.01 kg/m  Body mass index is  29.01 kg/m. Wt Readings from Last 3 Encounters:  03/16/16 169 lb (76.7 kg)  03/15/16 172 lb (78 kg)  05/03/15 177 lb (80.3 kg)   Constitutional: overweight, in NAD Eyes: PERRLA, EOMI, no exophthalmos ENT: moist mucous membranes, no thyromegaly, no cervical lymphadenopathy Cardiovascular: RRR, No MRG Respiratory: CTA B Gastrointestinal: abdomen soft, NT, ND, BS+ Musculoskeletal: no deformities, strength intact in all 4 Skin: moist, warm, no rashes Neurological: no tremor with outstretched hands, DTR normal in all 4  ASSESSMENT: 1. DM2, insulin-dependent, uncontrolled, with complications - Diabetic retinopathy and left eye - Peripheral neuropathy   PLAN:  1. Patient with long-standing, uncontrolled diabetes, on oral antidiabetic regimen + basal-bolus insulin regimen, with improving sugars. At last visit, we had to decrease Levemir dose. She went back up on the dose to 40 units at the time of her steroid inj >> remains on this dose. Will decrease the dose again. - Sugars at bedtime are highest 2/2 snacks >> will try to cover these with Novolog, but if she starts walking again and her insulin resistance improves, will not need this. - reviewed last calc. HbA1c: 5.7% (excellent) - I advised her to:  Patient Instructions  Please continue: - Metformin 1000 mg 2x a day with meals  Please decrease: - Levemir 35 units at night  Continue: - Mealtime Novolog:  - 6 units before a smaller meal - 8 units before a regular meal - 10 units before a large meal  - Sliding scale Novolog: - 151-175: + 1 unit  - 176-200: + 2 units  - 201-225: + 3 units  - 226-250: + 4 units  - >250: + 5 units  If sugars before the meal are 60 or lower, please do not take  the Novolog dose. If sugars before the meal are 61-80, take only half of the Novolog dose.  If you have an after dinner snack, >15g carbs, then take 2 units of insulin before this.  Please return in 3 months with your sugar log.   -  Continue checking sugars at different times of the day - check 2-3 times a day, rotating checks - advised for yearly eye exams >> she is UTD - will check a new fructosamine level - Return to clinic in 3 mo with sugar log   Office Visit on 03/16/2016  Component Date Value Ref Range Status  . Fructosamine 03/20/2016 238  190 - 270 umol/L Final   The HbA1c calculated from the fructosamine is great, at 5.6%.  Philemon Kingdom, MD PhD [facility] Endocrinology

## 2016-03-16 NOTE — Patient Instructions (Addendum)
Please continue: - Metformin 1000 mg 2x a day with meals  Please decrease: - Levemir 35 units at night  Continue: - Mealtime Novolog:  - 6 units before a smaller meal - 8 units before a regular meal - 10 units before a large meal  - Sliding scale Novolog: - 151-175: + 1 unit  - 176-200: + 2 units  - 201-225: + 3 units  - 226-250: + 4 units  - >250: + 5 units  If sugars before the meal are 60 or lower, please do not take the Novolog dose. If sugars before the meal are 61-80, take only half of the Novolog dose.  If you have an after dinner snack, >15g carbs, then take 2 units of insulin before this.  Please return in 3 months with your sugar log.

## 2016-03-20 LAB — FRUCTOSAMINE: FRUCTOSAMINE: 238 umol/L (ref 190–270)

## 2016-04-08 ENCOUNTER — Other Ambulatory Visit: Payer: Self-pay | Admitting: Internal Medicine

## 2016-04-13 ENCOUNTER — Other Ambulatory Visit: Payer: Self-pay | Admitting: Rheumatology

## 2016-04-13 NOTE — Telephone Encounter (Signed)
Last Visit: 03/15/16 Next Visit: 08/17/16  Okay to refill Ambien?

## 2016-04-14 ENCOUNTER — Other Ambulatory Visit: Payer: Self-pay | Admitting: *Deleted

## 2016-04-22 ENCOUNTER — Observation Stay (HOSPITAL_COMMUNITY): Payer: Medicare Other

## 2016-04-22 ENCOUNTER — Encounter (HOSPITAL_COMMUNITY): Payer: Self-pay | Admitting: *Deleted

## 2016-04-22 ENCOUNTER — Emergency Department (HOSPITAL_COMMUNITY): Payer: Medicare Other

## 2016-04-22 ENCOUNTER — Observation Stay (HOSPITAL_COMMUNITY)
Admission: EM | Admit: 2016-04-22 | Discharge: 2016-04-23 | Disposition: A | Discharge status: Home / Self Care | Payer: Medicare Other | Attending: Internal Medicine | Admitting: Internal Medicine

## 2016-04-22 DIAGNOSIS — IMO0002 Reserved for concepts with insufficient information to code with codable children: Secondary | ICD-10-CM

## 2016-04-22 DIAGNOSIS — R0902 Hypoxemia: Secondary | ICD-10-CM

## 2016-04-22 DIAGNOSIS — I2510 Atherosclerotic heart disease of native coronary artery without angina pectoris: Secondary | ICD-10-CM | POA: Diagnosis not present

## 2016-04-22 DIAGNOSIS — N390 Urinary tract infection, site not specified: Secondary | ICD-10-CM | POA: Diagnosis not present

## 2016-04-22 DIAGNOSIS — Z79899 Other long term (current) drug therapy: Secondary | ICD-10-CM | POA: Insufficient documentation

## 2016-04-22 DIAGNOSIS — J45909 Unspecified asthma, uncomplicated: Secondary | ICD-10-CM | POA: Diagnosis not present

## 2016-04-22 DIAGNOSIS — R600 Localized edema: Secondary | ICD-10-CM | POA: Diagnosis not present

## 2016-04-22 DIAGNOSIS — R55 Syncope and collapse: Secondary | ICD-10-CM | POA: Diagnosis not present

## 2016-04-22 DIAGNOSIS — N3000 Acute cystitis without hematuria: Secondary | ICD-10-CM

## 2016-04-22 DIAGNOSIS — E11319 Type 2 diabetes mellitus with unspecified diabetic retinopathy without macular edema: Secondary | ICD-10-CM | POA: Insufficient documentation

## 2016-04-22 DIAGNOSIS — Z794 Long term (current) use of insulin: Secondary | ICD-10-CM | POA: Diagnosis not present

## 2016-04-22 DIAGNOSIS — I10 Essential (primary) hypertension: Secondary | ICD-10-CM | POA: Insufficient documentation

## 2016-04-22 DIAGNOSIS — S199XXA Unspecified injury of neck, initial encounter: Secondary | ICD-10-CM | POA: Diagnosis not present

## 2016-04-22 DIAGNOSIS — E1140 Type 2 diabetes mellitus with diabetic neuropathy, unspecified: Secondary | ICD-10-CM | POA: Diagnosis not present

## 2016-04-22 DIAGNOSIS — S069X9A Unspecified intracranial injury with loss of consciousness of unspecified duration, initial encounter: Secondary | ICD-10-CM | POA: Diagnosis not present

## 2016-04-22 DIAGNOSIS — M545 Low back pain, unspecified: Secondary | ICD-10-CM | POA: Diagnosis present

## 2016-04-22 DIAGNOSIS — E1165 Type 2 diabetes mellitus with hyperglycemia: Secondary | ICD-10-CM

## 2016-04-22 DIAGNOSIS — F329 Major depressive disorder, single episode, unspecified: Secondary | ICD-10-CM | POA: Insufficient documentation

## 2016-04-22 DIAGNOSIS — R509 Fever, unspecified: Secondary | ICD-10-CM | POA: Diagnosis not present

## 2016-04-22 DIAGNOSIS — Z7982 Long term (current) use of aspirin: Secondary | ICD-10-CM | POA: Insufficient documentation

## 2016-04-22 LAB — MAGNESIUM: Magnesium: 1.6 mg/dL — ABNORMAL LOW (ref 1.7–2.4)

## 2016-04-22 LAB — BLOOD GAS, ARTERIAL
ACID-BASE EXCESS: 0.6 mmol/L (ref 0.0–2.0)
Bicarbonate: 25.1 mmol/L (ref 20.0–28.0)
DRAWN BY: 418751
O2 CONTENT: 2 L/min
O2 SAT: 94.7 %
PATIENT TEMPERATURE: 98.6
pCO2 arterial: 43.1 mmHg (ref 32.0–48.0)
pH, Arterial: 7.383 (ref 7.350–7.450)
pO2, Arterial: 92.1 mmHg (ref 83.0–108.0)

## 2016-04-22 LAB — COMPREHENSIVE METABOLIC PANEL
ALK PHOS: 59 U/L (ref 38–126)
ALT: 13 U/L — AB (ref 14–54)
AST: 15 U/L (ref 15–41)
Albumin: 3.5 g/dL (ref 3.5–5.0)
Anion gap: 10 (ref 5–15)
BILIRUBIN TOTAL: 0.7 mg/dL (ref 0.3–1.2)
BUN: 11 mg/dL (ref 6–20)
CALCIUM: 8.7 mg/dL — AB (ref 8.9–10.3)
CO2: 25 mmol/L (ref 22–32)
CREATININE: 0.77 mg/dL (ref 0.44–1.00)
Chloride: 100 mmol/L — ABNORMAL LOW (ref 101–111)
GFR calc non Af Amer: >60 mL/min (ref >60)
GLUCOSE: 133 mg/dL — AB (ref 65–99)
Potassium: 3.5 mmol/L (ref 3.5–5.1)
SODIUM: 135 mmol/L (ref 135–145)
Total Protein: 6.7 g/dL (ref 6.5–8.1)

## 2016-04-22 LAB — INFLUENZA PANEL BY PCR (TYPE A & B)
Influenza A By PCR: NEGATIVE
Influenza B By PCR: NEGATIVE

## 2016-04-22 LAB — CBC
HCT: 34.8 % — ABNORMAL LOW (ref 36.0–46.0)
Hemoglobin: 10.9 g/dL — ABNORMAL LOW (ref 12.0–15.0)
MCH: 23.6 pg — ABNORMAL LOW (ref 26.0–34.0)
MCHC: 31.3 g/dL (ref 30.0–36.0)
MCV: 75.3 fL — ABNORMAL LOW (ref 78.0–100.0)
Platelets: 338 10*3/uL (ref 150–400)
RBC: 4.62 MIL/uL (ref 3.87–5.11)
RDW: 15.7 % — AB (ref 11.5–15.5)
WBC: 14.1 10*3/uL — ABNORMAL HIGH (ref 4.0–10.5)

## 2016-04-22 LAB — CBC WITH DIFFERENTIAL/PLATELET
Basophils Absolute: 0 10*3/uL (ref 0.0–0.1)
Basophils Relative: 0 %
EOS ABS: 0 10*3/uL (ref 0.0–0.7)
Eosinophils Relative: 0 %
HEMATOCRIT: 33.7 % — AB (ref 36.0–46.0)
HEMOGLOBIN: 10.6 g/dL — AB (ref 12.0–15.0)
LYMPHS ABS: 1 10*3/uL (ref 0.7–4.0)
LYMPHS PCT: 8 %
MCH: 23.8 pg — AB (ref 26.0–34.0)
MCHC: 31.5 g/dL (ref 30.0–36.0)
MCV: 75.6 fL — ABNORMAL LOW (ref 78.0–100.0)
Monocytes Absolute: 0.9 10*3/uL (ref 0.1–1.0)
Monocytes Relative: 7 %
NEUTROS ABS: 11.4 10*3/uL — AB (ref 1.7–7.7)
NEUTROS PCT: 85 %
Platelets: 325 10*3/uL (ref 150–400)
RBC: 4.46 MIL/uL (ref 3.87–5.11)
RDW: 15.6 % — ABNORMAL HIGH (ref 11.5–15.5)
WBC: 13.4 10*3/uL — ABNORMAL HIGH (ref 4.0–10.5)

## 2016-04-22 LAB — URINALYSIS, ROUTINE W REFLEX MICROSCOPIC
Bilirubin Urine: NEGATIVE
GLUCOSE, UA: NEGATIVE mg/dL
HGB URINE DIPSTICK: NEGATIVE
Ketones, ur: 20 mg/dL — AB
Leukocytes, UA: NEGATIVE
Nitrite: POSITIVE — AB
Protein, ur: NEGATIVE mg/dL
SPECIFIC GRAVITY, URINE: 1.018 (ref 1.005–1.030)
pH: 7 (ref 5.0–8.0)

## 2016-04-22 LAB — GLUCOSE, CAPILLARY: Glucose-Capillary: 160 mg/dL — ABNORMAL HIGH (ref 65–99)

## 2016-04-22 LAB — BASIC METABOLIC PANEL
Anion gap: 10 (ref 5–15)
BUN: 10 mg/dL (ref 6–20)
CALCIUM: 8.9 mg/dL (ref 8.9–10.3)
CO2: 26 mmol/L (ref 22–32)
CREATININE: 0.79 mg/dL (ref 0.44–1.00)
Chloride: 101 mmol/L (ref 101–111)
GFR calc Af Amer: >60 mL/min (ref >60)
GLUCOSE: 133 mg/dL — AB (ref 65–99)
Potassium: 3.7 mmol/L (ref 3.5–5.1)
Sodium: 137 mmol/L (ref 135–145)

## 2016-04-22 LAB — CBG MONITORING, ED
GLUCOSE-CAPILLARY: 112 mg/dL — AB (ref 65–99)
Glucose-Capillary: 113 mg/dL — ABNORMAL HIGH (ref 65–99)

## 2016-04-22 LAB — BRAIN NATRIURETIC PEPTIDE: B Natriuretic Peptide: 211.9 pg/mL — ABNORMAL HIGH (ref 0.0–100.0)

## 2016-04-22 LAB — I-STAT TROPONIN, ED: Troponin i, poc: 0 ng/mL (ref 0.00–0.08)

## 2016-04-22 LAB — TROPONIN I
Troponin I: <0.03 ng/mL (ref <0.03)
Troponin I: 0.03 ng/mL (ref <0.03)

## 2016-04-22 MED ORDER — ADULT MULTIVITAMIN W/MINERALS CH
1.0000 | ORAL_TABLET | Freq: Every day | ORAL | Status: DC
  Administered 2016-04-23: 1 via ORAL
  Filled 2016-04-22 (×2): qty 1

## 2016-04-22 MED ORDER — CALCIUM CARBONATE-VITAMIN D 500-200 MG-UNIT PO TABS
1.0000 | ORAL_TABLET | Freq: Two times a day (BID) | ORAL | Status: DC
  Administered 2016-04-22 – 2016-04-23 (×2): 1 via ORAL
  Filled 2016-04-22 (×2): qty 1

## 2016-04-22 MED ORDER — DEXTROSE 5 % IV SOLN
1.0000 g | INTRAVENOUS | Status: DC
  Administered 2016-04-23: 1 g via INTRAVENOUS
  Filled 2016-04-22: qty 10

## 2016-04-22 MED ORDER — ONDANSETRON HCL 4 MG/2ML IJ SOLN
4.0000 mg | Freq: Once | INTRAMUSCULAR | Status: AC
  Administered 2016-04-22: 4 mg via INTRAVENOUS
  Filled 2016-04-22: qty 2

## 2016-04-22 MED ORDER — CYCLOBENZAPRINE HCL 5 MG PO TABS: 5.0000 mg | ORAL_TABLET | Freq: Two times a day (BID) | ORAL | Status: DC | PRN

## 2016-04-22 MED ORDER — DULOXETINE HCL 60 MG PO CPEP
60.0000 mg | ORAL_CAPSULE | Freq: Every day | ORAL | Status: DC
  Administered 2016-04-22: 60 mg via ORAL
  Filled 2016-04-22: qty 1

## 2016-04-22 MED ORDER — ONDANSETRON HCL 4 MG/2ML IJ SOLN: 4.0000 mg | Freq: Four times a day (QID) | INTRAMUSCULAR | Status: DC | PRN

## 2016-04-22 MED ORDER — INSULIN ASPART 100 UNIT/ML ~~LOC~~ SOLN: 6.0000 [IU] | Freq: Three times a day (TID) | SUBCUTANEOUS | Status: DC

## 2016-04-22 MED ORDER — BISACODYL 5 MG PO TBEC: 5.0000 mg | DELAYED_RELEASE_TABLET | Freq: Every day | ORAL | Status: DC | PRN

## 2016-04-22 MED ORDER — NITROGLYCERIN 0.4 MG SL SUBL: 0.4000 mg | SUBLINGUAL_TABLET | SUBLINGUAL | Status: DC | PRN

## 2016-04-22 MED ORDER — METFORMIN HCL 500 MG PO TABS
1000.0000 mg | ORAL_TABLET | Freq: Two times a day (BID) | ORAL | Status: DC
  Administered 2016-04-23: 1000 mg via ORAL
  Filled 2016-04-22: qty 2

## 2016-04-22 MED ORDER — SODIUM CHLORIDE 0.9% FLUSH
3.0000 mL | Freq: Two times a day (BID) | INTRAVENOUS | Status: DC
  Administered 2016-04-22 – 2016-04-23 (×2): 3 mL via INTRAVENOUS

## 2016-04-22 MED ORDER — MORPHINE SULFATE (PF) 2 MG/ML IV SOLN
2.0000 mg | INTRAVENOUS | Status: DC | PRN
Start: 2016-04-22 — End: 2016-04-23

## 2016-04-22 MED ORDER — INSULIN ASPART 100 UNIT/ML ~~LOC~~ SOLN
0.0000 [IU] | Freq: Three times a day (TID) | SUBCUTANEOUS | Status: DC
Start: 2016-04-23 — End: 2016-04-23

## 2016-04-22 MED ORDER — METOPROLOL TARTRATE 25 MG PO TABS
25.0000 mg | ORAL_TABLET | Freq: Two times a day (BID) | ORAL | Status: DC
  Administered 2016-04-22 – 2016-04-23 (×2): 25 mg via ORAL
  Filled 2016-04-22 (×2): qty 1

## 2016-04-22 MED ORDER — LISINOPRIL 2.5 MG PO TABS
2.5000 mg | ORAL_TABLET | Freq: Every day | ORAL | Status: DC
  Administered 2016-04-23: 2.5 mg via ORAL
  Filled 2016-04-22: qty 1

## 2016-04-22 MED ORDER — POTASSIUM CHLORIDE IN NACL 20-0.45 MEQ/L-% IV SOLN
INTRAVENOUS | Status: DC
Start: 2016-04-22 — End: 2016-04-23
  Administered 2016-04-22: 22:00:00 via INTRAVENOUS
  Filled 2016-04-22 (×2): qty 1000

## 2016-04-22 MED ORDER — ACETAMINOPHEN 500 MG PO TABS
1000.0000 mg | ORAL_TABLET | Freq: Once | ORAL | Status: AC
  Administered 2016-04-22: 1000 mg via ORAL
  Filled 2016-04-22: qty 2

## 2016-04-22 MED ORDER — ALBUTEROL SULFATE (2.5 MG/3ML) 0.083% IN NEBU: 3.0000 mL | INHALATION_SOLUTION | Freq: Four times a day (QID) | RESPIRATORY_TRACT | Status: DC | PRN

## 2016-04-22 MED ORDER — MAGNESIUM SULFATE 2 GM/50ML IV SOLN
2.0000 g | Freq: Once | INTRAVENOUS | Status: AC
  Administered 2016-04-22: 2 g via INTRAVENOUS
  Filled 2016-04-22: qty 50

## 2016-04-22 MED ORDER — ONDANSETRON HCL 4 MG PO TABS: 4.0000 mg | ORAL_TABLET | Freq: Four times a day (QID) | ORAL | Status: DC | PRN

## 2016-04-22 MED ORDER — DEXTROSE 5 % IV SOLN
1.0000 g | Freq: Once | INTRAVENOUS | Status: AC
  Administered 2016-04-22: 1 g via INTRAVENOUS
  Filled 2016-04-22: qty 10

## 2016-04-22 MED ORDER — CALCIUM CITRATE-VITAMIN D 250-100 MG-UNIT PO TABS: 1.0000 | ORAL_TABLET | Freq: Two times a day (BID) | ORAL | Status: DC

## 2016-04-22 MED ORDER — ASPIRIN EC 81 MG PO TBEC
81.0000 mg | DELAYED_RELEASE_TABLET | Freq: Every day | ORAL | Status: DC
Start: 2016-04-22 — End: 2016-04-23
  Administered 2016-04-23: 81 mg via ORAL
  Filled 2016-04-22: qty 1

## 2016-04-22 MED ORDER — INSULIN DETEMIR 100 UNIT/ML ~~LOC~~ SOLN
35.0000 [IU] | Freq: Every day | SUBCUTANEOUS | Status: DC
Start: 2016-04-22 — End: 2016-04-23
  Administered 2016-04-22: 35 [IU] via SUBCUTANEOUS
  Filled 2016-04-22 (×2): qty 0.35

## 2016-04-22 MED ORDER — SODIUM CHLORIDE 0.9 % IV BOLUS (SEPSIS)
1000.0000 mL | Freq: Once | INTRAVENOUS | Status: AC
  Administered 2016-04-22: 1000 mL via INTRAVENOUS

## 2016-04-22 MED ORDER — ENOXAPARIN SODIUM 40 MG/0.4ML ~~LOC~~ SOLN
40.0000 mg | SUBCUTANEOUS | Status: DC
  Administered 2016-04-22: 40 mg via SUBCUTANEOUS
  Filled 2016-04-22: qty 0.4

## 2016-04-22 MED ORDER — ALUM & MAG HYDROXIDE-SIMETH 200-200-20 MG/5ML PO SUSP: 30.0000 mL | Freq: Four times a day (QID) | ORAL | Status: DC | PRN

## 2016-04-22 NOTE — H&P (Signed)
History and Physical    WARREN CHRYSTAL [id number] DOB: 03/02/1951 DOA: 04/22/2016  Referring MD/NP/PA: Dr Oleta Mouse PCP: Howard Pouch, DO   Outpatient Specialists:  Patient coming from: Home Chief Complaint: Passed out today.  HPI: Priscilla [name] is a 66 y.o. female with medical history CAD, diabetes, hypertension, hyperlipidemia, and chronic low back pain who presented to the [facility] ED for syncopal episode today.   She was in her usual state of health until this morning when she was gripped with acute worsening of her chronic low back pain whilst in the process of sitting on the toilet commode when she went to the bathroom to urinate. She felt lightheaded briefly and subsequent passed out for unspecified period but apparently not very long because her husband states that he heard her fall, and by the time he came in to the bathroom, she was awake but seemed a little disoriented, and was apparently confused for about 30 minutes or so, but was back to baseline at the ED. No observed seizure disorder, tongue biting, bladder or bowel incontinence. Denies any associated chest pain, lower extremity leg swelling or leg pain, shortness of breath, abdominal pain, numbness or weakness focally.    She notes that her daughter has been in the hospital after a C-section and she has been staying in her room on a very low and hard bed, and also she has not been eating well. She has had some sore throat, congestion but denied, fever or chills, though had a low grade fever documented at ED.  Her [place] at home have been sick with similar symptoms cold symptoms.    Review of Systems: As per HPI otherwise 10 point review of systems negative.   Past Medical History:  Diagnosis Date  . Anemia 12/14/2008  . Anxiety and depression 12/14/2008  . Asthma 11/12/2006  . CAD (coronary artery disease) 12/14/2008   hx transluminal coronary angioplasty  . Cataract   . Depression   . Diabetes mellitus with  neuropathy ([place]) 12/14/2008  . Fibromyalgia 01/04/2007   sees rheuamtology  . Hyperlipemia 01/21/2010  . Hypertension 12/14/2008  . Leg edema 12/14/2008  . OSA (obstructive sleep apnea) 08/06/2008  . Osteoarthritis 12/14/2008  . Palpitations 11/12/2006   hx sinus tachy  . Psoriatic arthritis [facility])    sees rheumatology  . Rotator cuff injury     Past Surgical History:  Procedure Laterality Date  . ABDOMINAL HYSTERECTOMY    . CHOLECYSTECTOMY    . LAPAROSCOPIC GASTRIC BANDING    . PTCA    . WRIST SURGERY     LEFT     reports that she has never smoked. She has never used smokeless tobacco. She reports that she drinks alcohol. She reports that she does not use drugs.  No Known Allergies  Family History  Problem Relation Age of Onset  . Other Mother     ALS  . Arthritis Mother   . Stroke Father   . Alcohol abuse Father   . Diabetes Father   . Heart disease Father   . Early death Father 62  . Diabetes Sister   . Arthritis Sister   . COPD Brother   . Arthritis Maternal Aunt   . Diabetes Maternal Aunt   . Early death Maternal Aunt 40  . Diabetes Maternal Uncle   . Cancer Paternal Aunt   . COPD Paternal 27   . Heart disease Paternal Aunt   . Heart disease Paternal Uncle   . Alcohol  abuse Paternal Grandfather   . Heart disease Paternal Grandfather   . Early death Paternal Grandfather 43  . Stroke Paternal Grandfather     Prior to Admission medications   Medication Sig Start Date End Date Taking? Authorizing Provider  aspirin 81 MG tablet Take 81 mg by mouth daily.     Yes Historical Provider, MD  Biotin 5000 MCG TABS Take 5,000 mcg by mouth daily.    Yes Historical Provider, MD  calcium-vitamin D 250-100 MG-UNIT per tablet Take 1 tablet by mouth 2 (two) times daily.   Yes Historical Provider, MD  cyclobenzaprine (FLEXERIL) 5 MG tablet Take 1 tablet (5 mg total) by mouth 2 (two) times daily as needed for muscle spasms. 01/23/16  Yes Duffy Bruce, MD  DULoxetine (CYMBALTA) 60  MG capsule TAKE 1 CAPSULE BY MOUTH EVERY DAY 03/15/16  Yes Naitik Panwala, PA-C  insulin aspart (NOVOLOG FLEXPEN) 100 UNIT/ML FlexPen Inject 6-8 Units into the skin 3 (three) times daily with meals. Plus sliding scale. 01/07/16  Yes Philemon Kingdom, MD  Insulin Detemir (LEVEMIR FLEXTOUCH) 100 UNIT/ML Pen Inject 35 Units into the skin at bedtime. 10/18/15  Yes Philemon Kingdom, MD  lisinopril (PRINIVIL,ZESTRIL) 2.5 MG tablet TAKE 1 TABLET BY MOUTH EVERY DAY 02/01/16  Yes Renee A Kuneff, DO  metFORMIN (GLUCOPHAGE) 1000 MG tablet TAKE 1 TABLET BY MOUTH 2 TIMES DAILY WITH A MEAL 04/11/16  Yes Philemon Kingdom, MD  metoprolol tartrate (LOPRESSOR) 25 MG tablet TAKE 1 TABLET BY MOUTH 2 TIMES DAILY 11/15/15  Yes Lucretia Kern, DO  Multiple Vitamin (MULTIVITAMIN) tablet Take 1 tablet by mouth daily.   Yes Historical Provider, MD  Phenyleph-Doxylamine-DM-APAP (ALKA-SELTZER PLS NIGHT CLD/FLU) 5-6.25-10-325 MG CAPS Take 2 capsules by mouth daily as needed (for cold symptoms).   Yes Historical Provider, MD  zolpidem (AMBIEN) 10 MG tablet TAKE 1 TABLET BY MOUTH AT BEDTIME AS NEEDED Patient taking differently: TAKE 5 MG BY MOUTH AT BEDTIME AS NEEDED FOR SLEEP 04/13/16  Yes Naitik Panwala, PA-C  FREESTYLE LITE test strip USE TO TEST BLOOD SUGAR TWICE A DAY Patient not taking: Reported on 04/22/2016 06/09/14   Marletta Lor, MD  glucagon (GLUCAGON EMERGENCY) 1 MG injection Inject 1 mg into the muscle once as needed. 05/18/15   Philemon Kingdom, MD  Insulin Pen Needle (NOVOFINE) 32G X 6 MM MISC Use with levemir pen as instructed Patient not taking: Reported on 04/22/2016 10/31/13   Lucretia Kern, DO  Insulin Syringe-Needle U-100 (B-D INS SYR MICROFINE 1CC/27G) 27G X 5/8" 1 ML MISC Use 4 times daily prior to meals and at bedtime Patient not taking: Reported on 04/22/2016 08/26/13   Marletta Lor, MD  methylPREDNISolone (MEDROL DOSEPAK) 4 MG TBPK tablet Take as described on package. Patient not taking: Reported on 04/22/2016  01/23/16   Duffy Bruce, MD  naproxen (NAPROSYN) 375 MG tablet Take 1 tablet (375 mg total) by mouth 2 (two) times daily as needed for moderate pain. Patient not taking: Reported on 04/22/2016 01/23/16   Duffy Bruce, MD  nitroGLYCERIN (NITROSTAT) 0.4 MG SL tablet Place 0.4 mg under the tongue every 5 (five) minutes as needed for chest pain. Reported on 07/19/2015 08/04/10   Marletta Lor, MD  PROAIR HFA 108 (90 BASE) MCG/ACT inhaler inhale 2 puffs by mouth every 6 hours if needed Patient taking differently: inhale 2 puffs by mouth every 6 hours if needed for shortness of breath 06/27/11   Marletta Lor, MD  Zoster Vaccine Live, PF, (ZOSTAVAX)  19400 UNT/0.65ML injection Inject 19,400 Units into the skin once. Patient not taking: Reported on 04/22/2016 08/06/15   Ma Hillock, DO    Physical Exam: Vitals:   04/22/16 1145 04/22/16 1215 04/22/16 1400 04/22/16 1500  BP: 103/56 112/70 112/66 103/59  Pulse: 80 80 79 70  Resp: 21 20 16 16   Temp:      TempSrc:      SpO2: 95% 94% 93% 96%  Weight:      Height:          Constitutional: NAD, calm, comfortable Vitals:   04/22/16 1145 04/22/16 1215 04/22/16 1400 04/22/16 1500  BP: 103/56 112/70 112/66 103/59  Pulse: 80 80 79 70  Resp: 21 20 16 16   Temp:      TempSrc:      SpO2: 95% 94% 93% 96%  Weight:      Height:       Eyes: PERRL, lids and conjunctivae normal ENMT: Mucous membranes are moist. Posterior pharynx clear of any exudate or lesions.Normal dentition.  Neck: normal, supple, no masses, no thyromegaly Respiratory: clear to auscultation bilaterally, no wheezing, no crackles. Normal respiratory effort. No accessory muscle use.  Cardiovascular: Regular rate and rhythm, no murmurs / rubs / gallops. No extremity edema. 2+ pedal pulses. No carotid bruits.  Abdomen: no tenderness, no masses palpated. No hepatosplenomegaly. Bowel sounds positive.  Musculoskeletal: no clubbing / cyanosis. No joint deformity upper and lower  extremities. Good ROM, no contractures. Normal muscle tone.  Skin: no rashes, lesions, ulcers. No induration Neurologic: CN 2-12 grossly intact. Sensation intact, DTR normal. Strength 5/5 in all 4.  Psychiatric: Normal judgment and insight. Alert and oriented x 3. Normal mood.    Labs on Admission: I have personally reviewed following labs and imaging studies  CBC:  Recent Labs Lab 04/22/16 1045 04/22/16 1311  WBC 14.1* 13.4*  NEUTROABS  --  11.4*  HGB 10.9* 10.6*  HCT 34.8* 33.7*  MCV 75.3* 75.6*  PLT 338 [id number]   Basic Metabolic Panel:  Recent Labs Lab 04/22/16 1045 04/22/16 1311  NA 137 135  K 3.7 3.5  CL 101 100*  CO2 26 25  GLUCOSE 133* 133*  BUN 10 11  CREATININE 0.79 0.77  CALCIUM 8.9 8.7*  MG  --  1.6*   GFR: Estimated Creatinine Clearance: 69.6 mL/min (by C-G formula based on SCr of 0.77 mg/dL). Liver Function Tests:  Recent Labs Lab 04/22/16 1311  AST 15  ALT 13*  ALKPHOS 59  BILITOT 0.7  PROT 6.7  ALBUMIN 3.5   No results for input(s): LIPASE, AMYLASE in the last 168 hours. No results for input(s): AMMONIA in the last 168 hours. Coagulation Profile: No results for input(s): INR, PROTIME in the last 168 hours. Cardiac Enzymes: No results for input(s): CKTOTAL, CKMB, CKMBINDEX, TROPONINI in the last 168 hours. BNP (last 3 results) No results for input(s): PROBNP in the last 8760 hours. HbA1C: No results for input(s): HGBA1C in the last 72 hours. CBG:  Recent Labs Lab 04/22/16 1033 04/22/16 1321  GLUCAP 112* 113*   Lipid Profile: No results for input(s): CHOL, HDL, LDLCALC, TRIG, CHOLHDL, LDLDIRECT in the last 72 hours. Thyroid Function Tests: No results for input(s): TSH, T4TOTAL, FREET4, T3FREE, THYROIDAB in the last 72 hours. Anemia Panel: No results for input(s): VITAMINB12, FOLATE, FERRITIN, TIBC, IRON, RETICCTPCT in the last 72 hours. Urine analysis:    Component Value Date/Time   COLORURINE YELLOW 04/22/2016 1240    APPEARANCEUR HAZY (A) 04/22/2016 1240  LABSPEC 1.018 04/22/2016 1240   PHURINE 7.0 04/22/2016 1240   GLUCOSEU NEGATIVE 04/22/2016 1240   HGBUR NEGATIVE 04/22/2016 [place] 04/22/2016 1240   BILIRUBINUR neg 12/16/2014 1554   KETONESUR 20 (A) 04/22/2016 1240   PROTEINUR NEGATIVE 04/22/2016 1240   UROBILINOGEN 0.2 12/16/2014 1554   UROBILINOGEN 1.0 08/17/2013 1016   NITRITE POSITIVE (A) 04/22/2016 1240   LEUKOCYTESUR NEGATIVE 04/22/2016 1240   Sepsis Labs: @LABRCNTIP (procalcitonin:4,lacticidven:4) )No results found for this or any previous visit (from the past 240 hour(s)).   Radiological Exams on Admission: Dg Chest 2 View  Result Date: 04/22/2016 CLINICAL DATA:  Fever today with syncopal episode. Pain in lower back, hurts with any movement, headach. Hx Diabetes, HTN, gastric surgery (lap band), non-smoker EXAM: CHEST  2 VIEW COMPARISON:  09/08/2008 FINDINGS: The heart size and mediastinal contours are within normal limits. Both lungs are clear. No pleural effusion or pneumothorax. The visualized skeletal structures are unremarkable. IMPRESSION: No active cardiopulmonary disease. Electronically Signed   By: Lajean Manes M.D.   On: 04/22/2016 12:57   Ct Head Wo Contrast  Result Date: 04/22/2016 CLINICAL DATA:  Syncopal episode while using the toilet. Hit head. Confusion. Initial encounter. EXAM: CT HEAD WITHOUT CONTRAST CT CERVICAL SPINE WITHOUT CONTRAST TECHNIQUE: Multidetector CT imaging of the head and cervical spine was performed following the standard protocol without intravenous contrast. Multiplanar CT image reconstructions of the cervical spine were also generated. COMPARISON:  CT head without contrast 10/06/2005 FINDINGS: CT HEAD FINDINGS Brain: No acute infarct, hemorrhage, or mass lesion is present. The ventricles are of normal size. No significant extraaxial fluid collection is present. No significant white matter disease is present. The brainstem and cerebellum  are normal. Vascular: No significant vascular calcifications or hyperdense vessel is present. Skull: The calvarium is intact. No focal lytic or blastic lesions are present. No significant extracranial soft tissue injury is evident. Sinuses/Orbits: The globes and orbits are within normal limits. CT CERVICAL SPINE FINDINGS Alignment: Slight degenerative anterolisthesis is present at C5-6 and C4-5. AP alignment is otherwise anatomic. There is straightening of the normal cervical lordosis. Skull base and vertebrae: The craniocervical junction is within normal limits. Vertebral body heights are maintained. No acute fracture is present. Soft tissues and spinal canal: The soft tissues of the neck are unremarkable. No focal mucosal lesions are present. The thyroid is normal. Salivary glands are within normal limits. No significant vascular calcifications are present. Disc levels: Multilevel uncovertebral and facet disease is present. Facets are fused on the left at C2-3. Moderate left foraminal narrowing is present at C2-3 moderate to severe foraminal stenosis is evident bilaterally at C3-4. There is severe left foraminal narrowing at C4-5. Moderate left foraminal stenosis is present at C5-6. Mild right foraminal narrowing is present at C6-7. Moderate to severe right foraminal narrowing is present at C7-T1. Upper chest: The lung apices are clear. IMPRESSION: 1. No acute trauma to the head or cervical spine. 2. Normal CT appearance of the brain. 3. Multilevel spondylosis of the cervical spine as described. Electronically Signed   By: San Morelle M.D.   On: 04/22/2016 14:11   Ct Cervical Spine Wo Contrast  Result Date: 04/22/2016 CLINICAL DATA:  Syncopal episode while using the toilet. Hit head. Confusion. Initial encounter. EXAM: CT HEAD WITHOUT CONTRAST CT CERVICAL SPINE WITHOUT CONTRAST TECHNIQUE: Multidetector CT imaging of the head and cervical spine was performed following the standard protocol without  intravenous contrast. Multiplanar CT image reconstructions of the cervical spine were also  generated. COMPARISON:  CT head without contrast 10/06/2005 FINDINGS: CT HEAD FINDINGS Brain: No acute infarct, hemorrhage, or mass lesion is present. The ventricles are of normal size. No significant extraaxial fluid collection is present. No significant white matter disease is present. The brainstem and cerebellum are normal. Vascular: No significant vascular calcifications or hyperdense vessel is present. Skull: The calvarium is intact. No focal lytic or blastic lesions are present. No significant extracranial soft tissue injury is evident. Sinuses/Orbits: The globes and orbits are within normal limits. CT CERVICAL SPINE FINDINGS Alignment: Slight degenerative anterolisthesis is present at C5-6 and C4-5. AP alignment is otherwise anatomic. There is straightening of the normal cervical lordosis. Skull base and vertebrae: The craniocervical junction is within normal limits. Vertebral body heights are maintained. No acute fracture is present. Soft tissues and spinal canal: The soft tissues of the neck are unremarkable. No focal mucosal lesions are present. The thyroid is normal. Salivary glands are within normal limits. No significant vascular calcifications are present. Disc levels: Multilevel uncovertebral and facet disease is present. Facets are fused on the left at C2-3. Moderate left foraminal narrowing is present at C2-3 moderate to severe foraminal stenosis is evident bilaterally at C3-4. There is severe left foraminal narrowing at C4-5. Moderate left foraminal stenosis is present at C5-6. Mild right foraminal narrowing is present at C6-7. Moderate to severe right foraminal narrowing is present at C7-T1. Upper chest: The lung apices are clear. IMPRESSION: 1. No acute trauma to the head or cervical spine. 2. Normal CT appearance of the brain. 3. Multilevel spondylosis of the cervical spine as described. Electronically  Signed   By: San Morelle M.D.   On: 04/22/2016 14:11    EKG: Independently reviewed. No acute ST-T w changes  Assessment/Plan Active Problems:   Syncope   Asthma   Low back pain   Hypoxia   Uncontrolled type 2 diabetes mellitus with retinopathy, with long-term current use of insulin (HCC)   UTI (urinary tract infection)   1.  Syncope  Likely vasovagal.  Serial cardiac enzymes.  12-lead EKG in the morning.  2.  Bronchial asthma:  Episodes of pulse oximetry measurements in their 80's range. Pulmonary exam unremarkable.  No history of COPD or sleep apnea. Check ABG. Supportive bronchodilator nebulizations when necessary.  3.  Low back pain: Supportive pain control as needed.  4.  Diabetes mellitus: Sliding scale insulin with monitoring of CBGs.  Check A1c.  5.  UTI: Antibiotic coverage.     DVT prophylaxis: (Lovenox) Code Status:  (Full) Family Communication: Husband, at Bedside Disposition Plan: Home. Consults called: N/A Admission status: ( obs / tele )   OSEI-BONSU,Lisbet Busker MD [facility] Pager [phone]  If 7PM-7AM, please contact night-coverage [email or web address] Password [facility]  04/22/2016, 4:26 PM

## 2016-04-22 NOTE — ED Notes (Signed)
Patient transported to CT 

## 2016-04-22 NOTE — ED Triage Notes (Signed)
Pt states she was using walker to use bathroom at 0800 am, and was attempting to sit on toilet when her chronic back pain flared and the next thing she knew her husband was standing over her.  Husband states at that time her speech was slurred, but is now clear.  Pt had taken half ambien and cold meds last night for uri s/s.  No neuro deficits at this time.

## 2016-04-22 NOTE — ED Provider Notes (Signed)
[name] DEPT Provider Note   CSN: [id number] Arrival date & time: 04/22/16  1014     History   Chief Complaint Chief Complaint  Patient presents with  . Loss of Consciousness  . Weakness    HPI [name] is a 66 y.o. female.  HPI  66 year old female who presents after syncopal episode and with generalized weakness. She has a history of CAD, diabetes, hypertension, hyperlipidemia, and chronic low back pain. States that her daughter has been in the hospital with C-section and she's been staying in her room on a very low and hard bed. Has been having acute on chronic low back pain for the past 2 days, requiring her walker for ambulation. States that she did take Ambien last night, which is normal for her and no other sedating medications. This morning I was using her walker to go to the bathroom to urinate. States that the act of sitting down on the toilet of gave her severe tobacco spasms, and she felt very briefly lightheaded and then had a syncopal episode where she woke up on the floor. Her husband states that she heard her fall from the bedroom, and by the time he came in to the bathroom, she was awake. He did seem a little disoriented, foggy and confused for about 30 minutes or so. Now back to baseline. No identified seizure disorder, tongue biting, bowel incontinence, but she did have some urine in her underwear, but there was also urine in the toilet where she was attempting to urinate.  She has had cold-like symptoms reached recently with sore throat, runny nose and cough with some muscle aches and cramps. Grand children at home have been sick with similar symptoms. Denies any associating chest pain, lower extremity leg swelling or leg pain, shortness of breath, abdominal pain, numbness or weakness focally.   Past Medical History:  Diagnosis Date  . Anemia 12/14/2008  . Anxiety and depression 12/14/2008  . Asthma 11/12/2006  . CAD (coronary artery disease) 12/14/2008   hx  transluminal coronary angioplasty  . Cataract   . Depression   . Diabetes mellitus with neuropathy ([place]) 12/14/2008  . Fibromyalgia 01/04/2007   sees rheuamtology  . Hyperlipemia 01/21/2010  . Hypertension 12/14/2008  . Leg edema 12/14/2008  . OSA (obstructive sleep apnea) 08/06/2008  . Osteoarthritis 12/14/2008  . Palpitations 11/12/2006   hx sinus tachy  . Psoriatic arthritis [facility])    sees rheumatology  . Rotator cuff injury     Patient Active Problem List   Diagnosis Date Noted  . Other psoriasis 03/14/2016  . Other insomnia 03/14/2016  . DDD (degenerative disc disease), cervical 03/14/2016  . 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 Spondylosis without myelopathy or radiculopathy, lumbar region 01/31/2016  . Medicare annual wellness visit, initial 08/06/2015  . Need for hepatitis C screening test 08/06/2015  . Thyroid disorder screen 08/06/2015  . Encounter for screening mammogram for malignant neoplasm of breast 08/06/2015  . Estrogen deficiency 08/06/2015  . Medicare welcome exam 08/06/2015  . Dizziness 07/19/2015  . Hypotension 07/19/2015  . Uncontrolled type 2 diabetes mellitus with retinopathy, with long-term current use of insulin ([place]) 05/18/2015  . Anxiety and depression 06/25/2014  . Psoriatic arthritis - sees rheumatologist 02/20/2014  . ASTHMA 12/14/2008  . EDEMA, ANKLES 12/14/2008  . PERIPHERAL NEUROPATHY 08/06/2008  . FIBROMYALGIA 08/06/2008  . OSTEOARTHRITIS 12/05/2006  . Hyperlipemia 11/12/2006  . Essential hypertension 11/12/2006    Past Surgical History:  Procedure Laterality Date  . ABDOMINAL HYSTERECTOMY    .  CHOLECYSTECTOMY    . LAPAROSCOPIC GASTRIC BANDING    . PTCA    . WRIST SURGERY     LEFT    OB History    No data available       Home Medications    Prior to Admission medications   Medication Sig Start Date End Date Taking? Authorizing Provider  aspirin 81 MG tablet Take 81 mg by mouth daily.     Yes Historical Provider, MD  Biotin 5000 MCG  TABS Take 5,000 mcg by mouth daily.    Yes Historical Provider, MD  calcium-vitamin D 250-100 MG-UNIT per tablet Take 1 tablet by mouth 2 (two) times daily.   Yes Historical Provider, MD  cyclobenzaprine (FLEXERIL) 5 MG tablet Take 1 tablet (5 mg total) by mouth 2 (two) times daily as needed for muscle spasms. 01/23/16  Yes Duffy Bruce, MD  DULoxetine (CYMBALTA) 60 MG capsule TAKE 1 CAPSULE BY MOUTH EVERY DAY 03/15/16  Yes Naitik Panwala, PA-C  insulin aspart (NOVOLOG FLEXPEN) 100 UNIT/ML FlexPen Inject 6-8 Units into the skin 3 (three) times daily with meals. Plus sliding scale. 01/07/16  Yes Philemon Kingdom, MD  Insulin Detemir (LEVEMIR FLEXTOUCH) 100 UNIT/ML Pen Inject 35 Units into the skin at bedtime. 10/18/15  Yes Philemon Kingdom, MD  lisinopril (PRINIVIL,ZESTRIL) 2.5 MG tablet TAKE 1 TABLET BY MOUTH EVERY DAY 02/01/16  Yes Renee A Kuneff, DO  metFORMIN (GLUCOPHAGE) 1000 MG tablet TAKE 1 TABLET BY MOUTH 2 TIMES DAILY WITH A MEAL 04/11/16  Yes Philemon Kingdom, MD  metoprolol tartrate (LOPRESSOR) 25 MG tablet TAKE 1 TABLET BY MOUTH 2 TIMES DAILY 11/15/15  Yes Lucretia Kern, DO  Multiple Vitamin (MULTIVITAMIN) tablet Take 1 tablet by mouth daily.   Yes Historical Provider, MD  Phenyleph-Doxylamine-DM-APAP (ALKA-SELTZER PLS NIGHT CLD/FLU) 5-6.25-10-325 MG CAPS Take 2 capsules by mouth daily as needed (for cold symptoms).   Yes Historical Provider, MD  zolpidem (AMBIEN) 10 MG tablet TAKE 1 TABLET BY MOUTH AT BEDTIME AS NEEDED Patient taking differently: TAKE 5 MG BY MOUTH AT BEDTIME AS NEEDED FOR SLEEP 04/13/16  Yes Naitik Panwala, PA-C  FREESTYLE LITE test strip USE TO TEST BLOOD SUGAR TWICE A DAY Patient not taking: Reported on 04/22/2016 06/09/14   Marletta Lor, MD  glucagon (GLUCAGON EMERGENCY) 1 MG injection Inject 1 mg into the muscle once as needed. 05/18/15   Philemon Kingdom, MD  Insulin Pen Needle (NOVOFINE) 32G X 6 MM MISC Use with levemir pen as instructed Patient not taking: Reported on  04/22/2016 10/31/13   Lucretia Kern, DO  Insulin Syringe-Needle U-100 (B-D INS SYR MICROFINE 1CC/27G) 27G X 5/8" 1 ML MISC Use 4 times daily prior to meals and at bedtime Patient not taking: Reported on 04/22/2016 08/26/13   Marletta Lor, MD  methylPREDNISolone (MEDROL DOSEPAK) 4 MG TBPK tablet Take as described on package. Patient not taking: Reported on 04/22/2016 01/23/16   Duffy Bruce, MD  naproxen (NAPROSYN) 375 MG tablet Take 1 tablet (375 mg total) by mouth 2 (two) times daily as needed for moderate pain. Patient not taking: Reported on 04/22/2016 01/23/16   Duffy Bruce, MD  nitroGLYCERIN (NITROSTAT) 0.4 MG SL tablet Place 0.4 mg under the tongue every 5 (five) minutes as needed for chest pain. Reported on 07/19/2015 08/04/10   Marletta Lor, MD  PROAIR HFA 108 [phone] BASE) MCG/ACT inhaler inhale 2 puffs by mouth every 6 hours if needed Patient taking differently: inhale 2 puffs by mouth every 6 hours  if needed for shortness of breath 06/27/11   Marletta Lor, MD  Zoster Vaccine Live, PF, (ZOSTAVAX) 60454 UNT/0.65ML injection Inject 19,400 Units into the skin once. Patient not taking: Reported on 04/22/2016 08/06/15   Ma Hillock, DO    Family History Family History  Problem Relation Age of Onset  . Other Mother     ALS  . Arthritis Mother   . Stroke Father   . Alcohol abuse Father   . Diabetes Father   . Heart disease Father   . Early death Father 55  . Diabetes Sister   . Arthritis Sister   . COPD Brother   . Arthritis Maternal Aunt   . Diabetes Maternal Aunt   . Early death Maternal Aunt 31  . Diabetes Maternal Uncle   . Cancer Paternal Aunt   . COPD Paternal 18   . Heart disease Paternal Aunt   . Heart disease Paternal Uncle   . Alcohol abuse Paternal Grandfather   . Heart disease Paternal Grandfather   . Early death Paternal Grandfather 19  . Stroke Paternal Grandfather     Social History Social History  Substance Use Topics  . Smoking status:  Never Smoker  . Smokeless tobacco: Never Used  . Alcohol use Yes     Comment: glass of wine-specially occasion     Allergies   Patient has no known allergies.   Review of Systems Review of Systems 10/14 systems reviewed and are negative other than those stated in the HPI   Physical Exam Updated Vital Signs BP 103/59   Pulse 70   Temp 100.7 F (38.2 C) (Oral)   Resp 16   Ht 5\' 4"  (1.626 m)   Wt 170 lb (77.1 kg)   SpO2 96%   BMI 29.18 kg/m   Physical Exam Physical Exam  Nursing note and vitals reviewed. Constitutional:  non-toxic, and in no acute distress Head: Normocephalic and atraumatic.  Mouth/Throat: Oropharynx is clear and dry.  Neck: Normal range of motion. Neck supple. no tenderness Cardiovascular: Normal rate and regular rhythm.   Pulmonary/Chest: Effort normal and breath sounds normal.  Abdominal: Soft. There is no tenderness. There is no rebound and no guarding.  Musculoskeletal: Normal range of motion.  Neurological: Alert, no facial droop, fluent speech, moves all extremities symmetrically, sensation to light touch in tact throughout Skin: Skin is warm and dry.  Psychiatric: Cooperative   ED Treatments / Results  Labs (all labs ordered are listed, but only abnormal results are displayed) Labs Reviewed  BASIC METABOLIC PANEL - Abnormal; Notable for the following:       Result Value   Glucose, Bld 133 (*)    All other components within normal limits  CBC - Abnormal; Notable for the following:    WBC 14.1 (*)    Hemoglobin 10.9 (*)    HCT 34.8 (*)    MCV 75.3 (*)    MCH 23.6 (*)    RDW 15.7 (*)    All other components within normal limits  URINALYSIS, ROUTINE W REFLEX MICROSCOPIC - Abnormal; Notable for the following:    APPearance HAZY (*)    Ketones, ur 20 (*)    Nitrite POSITIVE (*)    Bacteria, UA RARE (*)    Squamous Epithelial / LPF 0-5 (*)    All other components within normal limits  CBC WITH DIFFERENTIAL/PLATELET - Abnormal; Notable  for the following:    WBC 13.4 (*)    Hemoglobin 10.6 (*)  HCT 33.7 (*)    MCV 75.6 (*)    MCH 23.8 (*)    RDW 15.6 (*)    Neutro Abs 11.4 (*)    All other components within normal limits  COMPREHENSIVE METABOLIC PANEL - Abnormal; Notable for the following:    Chloride 100 (*)    Glucose, Bld 133 (*)    Calcium 8.7 (*)    ALT 13 (*)    All other components within normal limits  MAGNESIUM - Abnormal; Notable for the following:    Magnesium 1.6 (*)    All other components within normal limits  CBG MONITORING, ED - Abnormal; Notable for the following:    Glucose-Capillary 112 (*)    All other components within normal limits  CBG MONITORING, ED - Abnormal; Notable for the following:    Glucose-Capillary 113 (*)    All other components within normal limits  URINE CULTURE  INFLUENZA PANEL BY PCR (TYPE A & B, H1N1)  I-STAT TROPOININ, ED    EKG  EKG Interpretation  Date/Time:  Saturday April 22 2016 10:37:02 EST Ventricular Rate:  97 PR Interval:  174 QRS Duration: 90 QT Interval:  398 QTC Calculation: 505 R Axis:   -47 Text Interpretation:  Normal sinus rhythm Left axis deviation Possible Lateral infarct , age undetermined Prolonged QT Abnormal ECG prolongted QT, otherwise similar to prior EKG  Confirmed by Megan Presti MD, Bahar Shelden [phone]) on 04/22/2016 11:43:12 AM       Radiology Dg Chest 2 View  Result Date: 04/22/2016 CLINICAL DATA:  Fever today with syncopal episode. Pain in lower back, hurts with any movement, headach. Hx Diabetes, HTN, gastric surgery (lap band), non-smoker EXAM: CHEST  2 VIEW COMPARISON:  09/08/2008 FINDINGS: The heart size and mediastinal contours are within normal limits. Both lungs are clear. No pleural effusion or pneumothorax. The visualized skeletal structures are unremarkable. IMPRESSION: No active cardiopulmonary disease. Electronically Signed   By: Lajean Manes M.D.   On: 04/22/2016 12:57   Ct Head Wo Contrast  Result Date: 04/22/2016 CLINICAL  DATA:  Syncopal episode while using the toilet. Hit head. Confusion. Initial encounter. EXAM: CT HEAD WITHOUT CONTRAST CT CERVICAL SPINE WITHOUT CONTRAST TECHNIQUE: Multidetector CT imaging of the head and cervical spine was performed following the standard protocol without intravenous contrast. Multiplanar CT image reconstructions of the cervical spine were also generated. COMPARISON:  CT head without contrast 10/06/2005 FINDINGS: CT HEAD FINDINGS Brain: No acute infarct, hemorrhage, or mass lesion is present. The ventricles are of normal size. No significant extraaxial fluid collection is present. No significant white matter disease is present. The brainstem and cerebellum are normal. Vascular: No significant vascular calcifications or hyperdense vessel is present. Skull: The calvarium is intact. No focal lytic or blastic lesions are present. No significant extracranial soft tissue injury is evident. Sinuses/Orbits: The globes and orbits are within normal limits. CT CERVICAL SPINE FINDINGS Alignment: Slight degenerative anterolisthesis is present at C5-6 and C4-5. AP alignment is otherwise anatomic. There is straightening of the normal cervical lordosis. Skull base and vertebrae: The craniocervical junction is within normal limits. Vertebral body heights are maintained. No acute fracture is present. Soft tissues and spinal canal: The soft tissues of the neck are unremarkable. No focal mucosal lesions are present. The thyroid is normal. Salivary glands are within normal limits. No significant vascular calcifications are present. Disc levels: Multilevel uncovertebral and facet disease is present. Facets are fused on the left at C2-3. Moderate left foraminal narrowing is present at C2-3  moderate to severe foraminal stenosis is evident bilaterally at C3-4. There is severe left foraminal narrowing at C4-5. Moderate left foraminal stenosis is present at C5-6. Mild right foraminal narrowing is present at C6-7. Moderate  to severe right foraminal narrowing is present at C7-T1. Upper chest: The lung apices are clear. IMPRESSION: 1. No acute trauma to the head or cervical spine. 2. Normal CT appearance of the brain. 3. Multilevel spondylosis of the cervical spine as described. Electronically Signed   By: San Morelle M.D.   On: 04/22/2016 14:11   Ct Cervical Spine Wo Contrast  Result Date: 04/22/2016 CLINICAL DATA:  Syncopal episode while using the toilet. Hit head. Confusion. Initial encounter. EXAM: CT HEAD WITHOUT CONTRAST CT CERVICAL SPINE WITHOUT CONTRAST TECHNIQUE: Multidetector CT imaging of the head and cervical spine was performed following the standard protocol without intravenous contrast. Multiplanar CT image reconstructions of the cervical spine were also generated. COMPARISON:  CT head without contrast 10/06/2005 FINDINGS: CT HEAD FINDINGS Brain: No acute infarct, hemorrhage, or mass lesion is present. The ventricles are of normal size. No significant extraaxial fluid collection is present. No significant white matter disease is present. The brainstem and cerebellum are normal. Vascular: No significant vascular calcifications or hyperdense vessel is present. Skull: The calvarium is intact. No focal lytic or blastic lesions are present. No significant extracranial soft tissue injury is evident. Sinuses/Orbits: The globes and orbits are within normal limits. CT CERVICAL SPINE FINDINGS Alignment: Slight degenerative anterolisthesis is present at C5-6 and C4-5. AP alignment is otherwise anatomic. There is straightening of the normal cervical lordosis. Skull base and vertebrae: The craniocervical junction is within normal limits. Vertebral body heights are maintained. No acute fracture is present. Soft tissues and spinal canal: The soft tissues of the neck are unremarkable. No focal mucosal lesions are present. The thyroid is normal. Salivary glands are within normal limits. No significant vascular calcifications  are present. Disc levels: Multilevel uncovertebral and facet disease is present. Facets are fused on the left at C2-3. Moderate left foraminal narrowing is present at C2-3 moderate to severe foraminal stenosis is evident bilaterally at C3-4. There is severe left foraminal narrowing at C4-5. Moderate left foraminal stenosis is present at C5-6. Mild right foraminal narrowing is present at C6-7. Moderate to severe right foraminal narrowing is present at C7-T1. Upper chest: The lung apices are clear. IMPRESSION: 1. No acute trauma to the head or cervical spine. 2. Normal CT appearance of the brain. 3. Multilevel spondylosis of the cervical spine as described. Electronically Signed   By: San Morelle M.D.   On: 04/22/2016 14:11    Procedures Procedures (including critical care time)  Medications Ordered in ED Medications  magnesium sulfate IVPB 2 g 50 mL (2 g Intravenous New Bag/Given 04/22/16 1512)  acetaminophen (TYLENOL) tablet 1,000 mg (1,000 mg Oral Given 04/22/16 1313)  sodium chloride 0.9 % bolus 1,000 mL (0 mLs Intravenous Stopped 04/22/16 1434)  ondansetron (ZOFRAN) injection 4 mg (4 mg Intravenous Given 04/22/16 1313)  cefTRIAXone (ROCEPHIN) 1 g in dextrose 5 % 50 mL IVPB (0 g Intravenous Stopped 04/22/16 1504)     Initial Impression / Assessment and Plan / ED Course  I have reviewed the triage vital signs and the nursing notes.  Pertinent labs & imaging results that were available during my care of the patient were reviewed by me and considered in my medical decision making (see chart for details).  Clinical Course    Presenting with generalized weakness and loss of  consciousness. In the ED she is febrile to 100.7, but not tachycardic, hypotensive, or respiratory distress. Her syncopal episode seems likely vasovagal syncope in the setting of extreme pain. Her EKG however does show slightly prolonged QTC of 500. No other stigmata of arrhythmia. Mg 1.6 and repleted with 2 g mag sulfate.    Infectious workup was also received on patient. She is negative for influenza. Chest x-ray visualized, and shows no acute cardiopulmonary processes including infiltrate or edema. Leukocytosis of 13.4.  Her UA is nitrite positive with some bacteria, and she is given ceftriaxone with IV fluids. Feels slightly improved, but still expressing extreme fatigue and weakness and concerns for falling at home.  No major traumatic injuries noted on exam. CT head and cervical spine obtained due to confusion initially after syncopal episode. This is visualized and shows no acute traumatic injuries or acute intracranial processes.  Given that she still feels poorly, discussed this patient with Dr. Vista Lawman who will admit to hospitalist service for observation.    Final Clinical Impressions(s) / ED Diagnoses   Final diagnoses:  Vasovagal syncope  Acute cystitis without hematuria    New Prescriptions New Prescriptions   No medications on file     Forde Dandy, MD 04/22/16 [phone]

## 2016-04-22 NOTE — ED Notes (Signed)
Admitting MD at bedside.

## 2016-04-22 NOTE — ED Notes (Signed)
Care handoff to Aaron Edelman, [place]

## 2016-04-23 ENCOUNTER — Observation Stay (HOSPITAL_BASED_OUTPATIENT_CLINIC_OR_DEPARTMENT_OTHER): Payer: Medicare Other

## 2016-04-23 DIAGNOSIS — N390 Urinary tract infection, site not specified: Secondary | ICD-10-CM | POA: Diagnosis not present

## 2016-04-23 DIAGNOSIS — J45909 Unspecified asthma, uncomplicated: Secondary | ICD-10-CM | POA: Diagnosis not present

## 2016-04-23 DIAGNOSIS — R55 Syncope and collapse: Secondary | ICD-10-CM

## 2016-04-23 DIAGNOSIS — Z79899 Other long term (current) drug therapy: Secondary | ICD-10-CM | POA: Diagnosis not present

## 2016-04-23 DIAGNOSIS — I509 Heart failure, unspecified: Secondary | ICD-10-CM | POA: Diagnosis not present

## 2016-04-23 DIAGNOSIS — I2510 Atherosclerotic heart disease of native coronary artery without angina pectoris: Secondary | ICD-10-CM | POA: Diagnosis not present

## 2016-04-23 DIAGNOSIS — I10 Essential (primary) hypertension: Secondary | ICD-10-CM | POA: Diagnosis not present

## 2016-04-23 DIAGNOSIS — E1140 Type 2 diabetes mellitus with diabetic neuropathy, unspecified: Secondary | ICD-10-CM | POA: Diagnosis not present

## 2016-04-23 DIAGNOSIS — Z7982 Long term (current) use of aspirin: Secondary | ICD-10-CM | POA: Diagnosis not present

## 2016-04-23 DIAGNOSIS — Z794 Long term (current) use of insulin: Secondary | ICD-10-CM | POA: Diagnosis not present

## 2016-04-23 DIAGNOSIS — F329 Major depressive disorder, single episode, unspecified: Secondary | ICD-10-CM | POA: Diagnosis not present

## 2016-04-23 DIAGNOSIS — E11319 Type 2 diabetes mellitus with unspecified diabetic retinopathy without macular edema: Secondary | ICD-10-CM | POA: Diagnosis not present

## 2016-04-23 DIAGNOSIS — R600 Localized edema: Secondary | ICD-10-CM | POA: Diagnosis not present

## 2016-04-23 LAB — CBC
HEMATOCRIT: 30.9 % — AB (ref 36.0–46.0)
HEMOGLOBIN: 9.6 g/dL — AB (ref 12.0–15.0)
MCH: 24.1 pg — ABNORMAL LOW (ref 26.0–34.0)
MCHC: 31.1 g/dL (ref 30.0–36.0)
MCV: 77.6 fL — AB (ref 78.0–100.0)
PLATELETS: 271 10*3/uL (ref 150–400)
RBC: 3.98 MIL/uL (ref 3.87–5.11)
RDW: 16.2 % — ABNORMAL HIGH (ref 11.5–15.5)
WBC: 11.1 10*3/uL — AB (ref 4.0–10.5)

## 2016-04-23 LAB — BASIC METABOLIC PANEL
ANION GAP: 6 (ref 5–15)
BUN: 8 mg/dL (ref 6–20)
CHLORIDE: 105 mmol/L (ref 101–111)
CO2: 28 mmol/L (ref 22–32)
Calcium: 8.5 mg/dL — ABNORMAL LOW (ref 8.9–10.3)
Creatinine, Ser: 0.68 mg/dL (ref 0.44–1.00)
GFR calc non Af Amer: >60 mL/min (ref >60)
Glucose, Bld: 99 mg/dL (ref 65–99)
POTASSIUM: 4.1 mmol/L (ref 3.5–5.1)
SODIUM: 139 mmol/L (ref 135–145)

## 2016-04-23 LAB — GLUCOSE, CAPILLARY
GLUCOSE-CAPILLARY: 111 mg/dL — AB (ref 65–99)
Glucose-Capillary: 84 mg/dL (ref 65–99)

## 2016-04-23 LAB — TROPONIN I
Troponin I: <0.03 ng/mL (ref <0.03)
Troponin I: <0.03 ng/mL (ref <0.03)

## 2016-04-23 LAB — ECHOCARDIOGRAM COMPLETE
HEIGHTINCHES: 64 in
WEIGHTICAEL: 2708.8 [oz_av]

## 2016-04-23 LAB — HEMOGLOBIN A1C
Hgb A1c MFr Bld: 6.5 % — ABNORMAL HIGH (ref 4.8–5.6)
Mean Plasma Glucose: 140 mg/dL

## 2016-04-23 MED ORDER — ACETAMINOPHEN 325 MG PO TABS
650.0000 mg | ORAL_TABLET | Freq: Four times a day (QID) | ORAL | Status: DC | PRN
  Administered 2016-04-23: 650 mg via ORAL
  Filled 2016-04-23: qty 2

## 2016-04-23 NOTE — Discharge Instructions (Addendum)
Follow with Howard Pouch, DO as needed   If you had Pneumonia of Lung problems at the Hospital: Please get a 2 view Chest X ray done in 6-8 weeks after hospital discharge or sooner if instructed by your Primary MD.  If you have Congestive Heart Failure: Please call your Cardiologist or Primary MD anytime you have any of the following symptoms:  1) 3 pound weight gain in 24 hours or 5 pounds in 1 week  2) shortness of breath, with or without a dry hacking cough  3) swelling in the hands, feet or stomach  4) if you have to sleep on extra pillows at night in order to breathe  Follow cardiac low salt diet and 1.5 lit/day fluid restriction.  If you have diabetes Accuchecks 4 times/day, Once in AM empty stomach and then before each meal. Log in all results and show them to your primary doctor at your next visit. If any glucose reading is under 80 or above 300 call your primary MD immediately.  If you have Seizure/Convulsions/Epilepsy: Please do not drive, operate heavy machinery, participate in activities at heights or participate in high speed sports until you have seen by Primary MD or a Neurologist and advised to do so again.  If you had Gastrointestinal Bleeding: Please ask your Primary MD to check a complete blood count within one week of discharge or at your next visit. Your endoscopic/colonoscopic biopsies that are pending at the time of discharge, will also need to followed by your Primary MD.  Get Medicines reviewed and adjusted. Please take all your medications with you for your next visit with your Primary MD  Please request your Primary MD to go over all hospital tests and procedure/radiological results at the follow up, please ask your Primary MD to get all Hospital records sent to his/her office.  If you experience worsening of your admission symptoms, develop shortness of breath, life threatening emergency, suicidal or homicidal thoughts you must seek medical attention  immediately by calling 911 or calling your MD immediately  if symptoms less severe.  You must read complete instructions/literature along with all the possible adverse reactions/side effects for all the Medicines you take and that have been prescribed to you. Take any new Medicines after you have completely understood and accpet all the possible adverse reactions/side effects.   Do not drive or operate heavy machinery when taking Pain medications.   Do not take more than prescribed Pain, Sleep and Anxiety Medications  Special Instructions: If you have smoked or chewed Tobacco  in the last 2 yrs please stop smoking, stop any regular Alcohol  and or any Recreational drug use.  Wear Seat belts while driving.  Please note You were cared for by a hospitalist during your hospital stay. If you have any questions about your discharge medications or the care you received while you were in the hospital after you are discharged, you can call the unit and asked to speak with the hospitalist on call if the hospitalist that took care of you is not available. Once you are discharged, your primary care physician will handle any further medical issues. Please note that NO REFILLS for any discharge medications will be authorized once you are discharged, as it is imperative that you return to your primary care physician (or establish a relationship with a primary care physician if you do not have one) for your aftercare needs so that they can reassess your need for medications and monitor your lab values.  You can reach the hospitalist office at phone [phone] or fax [phone]   If you do not have a primary care physician, you can call [phone] for a physician referral.  Activity: As tolerated with Full fall precautions use walker/cane & assistance as needed  Diet: diabetic  Disposition Home

## 2016-04-23 NOTE — Progress Notes (Signed)
  Echocardiogram 2D Echocardiogram has been performed.  Priscilla [name] 04/23/2016, 8:50 AM

## 2016-04-23 NOTE — Discharge Summary (Signed)
Physician Discharge Summary  Priscilla [name] [id number] DOB: 1950/11/09 DOA: 04/22/2016  PCP: Howard Pouch, DO  Admit date: 04/22/2016 Discharge date: 04/23/2016  Admitted From: home Disposition:  home  Recommendations for Outpatient Follow-up:  1. Follow up with PCP in 1-2 weeks  Home Health: none Equipment/Devices: none  Discharge Condition: stable CODE STATUS: Full code Diet recommendation: diabetic  HPI: Per Dr. Vista Lawman, Priscilla [name] is a 66 y.o. female with medical history CAD, diabetes, hypertension, hyperlipidemia, and chronic low back pain who presented to the [facility] ED for syncopal episode today. She was in her usual state of health until this morning when she was gripped with acute worsening of her chronic low back pain whilst in the process of sitting on the toilet commode when she went to the bathroom to urinate. She felt lightheaded briefly and subsequent passed out for unspecified period but apparently not very long because her husband states that he heard her fall, and by the time he came in to the bathroom, she was awake but seemed a little disoriented, and was apparently confused for about 30 minutes or so, but was back to baseline at the ED. No observed seizure disorder, tongue biting, bladder or bowel incontinence. Denies any associated chest pain, lower extremity leg swelling or leg pain, shortness of breath, abdominal pain, numbness or weakness focally. She notes that her daughter has been in the hospital after a C-section and she has been staying in her room on a very low and hard bed, and also she has not been eating well.She has had some sore throat, congestion but denied, fever or chills, though had a low grade fever documented at ED. Her [place] at home have been sick with similar symptoms cold symptoms.  Hospital Course: Discharge Diagnoses:  Active Problems:   Asthma   Low back pain   Uncontrolled type 2 diabetes mellitus with retinopathy,  with long-term current use of insulin (HCC)   Syncope   UTI (urinary tract infection)   Hypoxia  Patient was admitted to the hospital with a brief syncopal episode in the setting of worsening back pain to the point that it became excruciating likely causing a vasovagal syncope. She was not orthostatic and has remained stable while in the hospital. Telemetry was unremarkable. Given mild BNP elevation on admission she underwent a 2D echo which showed normal EF and mild LVH. There were concerns for a UTI on admission with UA with rare bacteria and positive nitrates and was placed on Ceftriaxone. She only has 0-5 WBC and is completely asymptomatic. She has had symptomatic UTIs in the past and currently denies dysuria, denies increased frequency or pain. Will not need further antibiotics on d/c. All her other medical problems have remained stable and she was discharged home in stable condition without any medication changes to her chronic regimen.   Discharge Instructions   Allergies as of 04/23/2016   No Known Allergies     Medication List    TAKE these medications   ALKA-SELTZER PLS NIGHT CLD/FLU 5-6.25-10-325 MG Caps Generic drug:  Phenyleph-Doxylamine-DM-APAP Take 2 capsules by mouth daily as needed (for cold symptoms).   aspirin 81 MG tablet Take 81 mg by mouth daily.   Biotin 5000 MCG Tabs Take 5,000 mcg by mouth daily.   calcium-vitamin D 250-100 MG-UNIT tablet Take 1 tablet by mouth 2 (two) times daily.   cyclobenzaprine 5 MG tablet Commonly known as:  FLEXERIL Take 1 tablet (5 mg total) by mouth 2 (two)  times daily as needed for muscle spasms.   DULoxetine 60 MG capsule Commonly known as:  CYMBALTA TAKE 1 CAPSULE BY MOUTH EVERY DAY   FREESTYLE LITE test strip Generic drug:  glucose blood USE TO TEST BLOOD SUGAR TWICE A DAY   glucagon 1 MG injection Commonly known as:  GLUCAGON EMERGENCY Inject 1 mg into the muscle once as needed.   insulin aspart 100 UNIT/ML  FlexPen Commonly known as:  NOVOLOG FLEXPEN Inject 6-8 Units into the skin 3 (three) times daily with meals. Plus sliding scale.   Insulin Detemir 100 UNIT/ML Pen Commonly known as:  LEVEMIR FLEXTOUCH Inject 35 Units into the skin at bedtime.   Insulin Pen Needle 32G X 6 MM Misc Commonly known as:  NOVOFINE Use with levemir pen as instructed   Insulin Syringe-Needle U-100 27G X 5/8" 1 ML Misc Commonly known as:  B-D INS SYR MICROFINE 1CC/27G Use 4 times daily prior to meals and at bedtime   lisinopril 2.5 MG tablet Commonly known as:  PRINIVIL,ZESTRIL TAKE 1 TABLET BY MOUTH EVERY DAY   metFORMIN 1000 MG tablet Commonly known as:  GLUCOPHAGE TAKE 1 TABLET BY MOUTH 2 TIMES DAILY WITH A MEAL   metoprolol tartrate 25 MG tablet Commonly known as:  LOPRESSOR TAKE 1 TABLET BY MOUTH 2 TIMES DAILY   multivitamin tablet Take 1 tablet by mouth daily.   naproxen 375 MG tablet Commonly known as:  NAPROSYN Take 1 tablet (375 mg total) by mouth 2 (two) times daily as needed for moderate pain.   nitroGLYCERIN 0.4 MG SL tablet Commonly known as:  NITROSTAT Place 0.4 mg under the tongue every 5 (five) minutes as needed for chest pain. Reported on 07/19/2015   PROAIR HFA 108 (90 Base) MCG/ACT inhaler Generic drug:  albuterol inhale 2 puffs by mouth every 6 hours if needed What changed:  See the new instructions.   zolpidem 10 MG tablet Commonly known as:  AMBIEN TAKE 1 TABLET BY MOUTH AT BEDTIME AS NEEDED What changed:  See the new instructions.   Zoster Vaccine Live (PF) 19400 UNT/0.65ML injection Commonly known as:  ZOSTAVAX Inject 19,400 Units into the skin once.      Follow-up Information    Howard Pouch, DO Follow up.   Specialty:  Family Medicine Why:  As needed Contact information: [address] [place] [address] [phone]          No Known Allergies  Consultations:  None   Procedures/Studies:  2D echo   Study Conclusions - Left ventricle: The  cavity size was normal. Wall thickness was increased increased in a pattern of mild to moderate LVH. Systolic function was normal. The estimated ejection fraction was in the range of 60% to 65%. Diastolic function is indeterminant. Wall motion was normal; there were no regional wall motion abnormalities. - Aortic valve: Mildly calcified annulus. Trileaflet; normal thickness leaflets. There was mild regurgitation. Valve area (VTI): 2.35 cm^2. Valve area (Vmax): 2.69 cm^2. Valve area (Vmean): 2.56 cm^2. - Mitral valve: Mildly calcified annulus. Normal thickness leaflets - Left atrium: The atrium was mildly dilated.   X-ray Chest Pa And Lateral  Result Date: 04/22/2016 CLINICAL DATA:  Passed out this morning.  Low oxygen saturations. EXAM: CHEST  2 VIEW COMPARISON:  Earlier the same day. FINDINGS: The heart size and mediastinal contours are within normal limits. Both lungs are clear. The visualized skeletal structures are unremarkable. IMPRESSION: No active cardiopulmonary disease. Electronically Signed   By: Verda Cumins.D.  On: 04/22/2016 19:52   Dg Chest 2 View  Result Date: 04/22/2016 CLINICAL DATA:  Fever today with syncopal episode. Pain in lower back, hurts with any movement, headach. Hx Diabetes, HTN, gastric surgery (lap band), non-smoker EXAM: CHEST  2 VIEW COMPARISON:  09/08/2008 FINDINGS: The heart size and mediastinal contours are within normal limits. Both lungs are clear. No pleural effusion or pneumothorax. The visualized skeletal structures are unremarkable. IMPRESSION: No active cardiopulmonary disease. Electronically Signed   By: Lajean Manes M.D.   On: 04/22/2016 12:57   Ct Head Wo Contrast  Result Date: 04/22/2016 CLINICAL DATA:  Syncopal episode while using the toilet. Hit head. Confusion. Initial encounter. EXAM: CT HEAD WITHOUT CONTRAST CT CERVICAL SPINE WITHOUT CONTRAST TECHNIQUE: Multidetector CT imaging of the head and cervical spine was performed following the standard  protocol without intravenous contrast. Multiplanar CT image reconstructions of the cervical spine were also generated. COMPARISON:  CT head without contrast 10/06/2005 FINDINGS: CT HEAD FINDINGS Brain: No acute infarct, hemorrhage, or mass lesion is present. The ventricles are of normal size. No significant extraaxial fluid collection is present. No significant white matter disease is present. The brainstem and cerebellum are normal. Vascular: No significant vascular calcifications or hyperdense vessel is present. Skull: The calvarium is intact. No focal lytic or blastic lesions are present. No significant extracranial soft tissue injury is evident. Sinuses/Orbits: The globes and orbits are within normal limits. CT CERVICAL SPINE FINDINGS Alignment: Slight degenerative anterolisthesis is present at C5-6 and C4-5. AP alignment is otherwise anatomic. There is straightening of the normal cervical lordosis. Skull base and vertebrae: The craniocervical junction is within normal limits. Vertebral body heights are maintained. No acute fracture is present. Soft tissues and spinal canal: The soft tissues of the neck are unremarkable. No focal mucosal lesions are present. The thyroid is normal. Salivary glands are within normal limits. No significant vascular calcifications are present. Disc levels: Multilevel uncovertebral and facet disease is present. Facets are fused on the left at C2-3. Moderate left foraminal narrowing is present at C2-3 moderate to severe foraminal stenosis is evident bilaterally at C3-4. There is severe left foraminal narrowing at C4-5. Moderate left foraminal stenosis is present at C5-6. Mild right foraminal narrowing is present at C6-7. Moderate to severe right foraminal narrowing is present at C7-T1. Upper chest: The lung apices are clear. IMPRESSION: 1. No acute trauma to the head or cervical spine. 2. Normal CT appearance of the brain. 3. Multilevel spondylosis of the cervical spine as described.  Electronically Signed   By: San Morelle M.D.   On: 04/22/2016 14:11   Ct Cervical Spine Wo Contrast  Result Date: 04/22/2016 CLINICAL DATA:  Syncopal episode while using the toilet. Hit head. Confusion. Initial encounter. EXAM: CT HEAD WITHOUT CONTRAST CT CERVICAL SPINE WITHOUT CONTRAST TECHNIQUE: Multidetector CT imaging of the head and cervical spine was performed following the standard protocol without intravenous contrast. Multiplanar CT image reconstructions of the cervical spine were also generated. COMPARISON:  CT head without contrast 10/06/2005 FINDINGS: CT HEAD FINDINGS Brain: No acute infarct, hemorrhage, or mass lesion is present. The ventricles are of normal size. No significant extraaxial fluid collection is present. No significant white matter disease is present. The brainstem and cerebellum are normal. Vascular: No significant vascular calcifications or hyperdense vessel is present. Skull: The calvarium is intact. No focal lytic or blastic lesions are present. No significant extracranial soft tissue injury is evident. Sinuses/Orbits: The globes and orbits are within normal limits. CT CERVICAL SPINE FINDINGS  Alignment: Slight degenerative anterolisthesis is present at C5-6 and C4-5. AP alignment is otherwise anatomic. There is straightening of the normal cervical lordosis. Skull base and vertebrae: The craniocervical junction is within normal limits. Vertebral body heights are maintained. No acute fracture is present. Soft tissues and spinal canal: The soft tissues of the neck are unremarkable. No focal mucosal lesions are present. The thyroid is normal. Salivary glands are within normal limits. No significant vascular calcifications are present. Disc levels: Multilevel uncovertebral and facet disease is present. Facets are fused on the left at C2-3. Moderate left foraminal narrowing is present at C2-3 moderate to severe foraminal stenosis is evident bilaterally at C3-4. There is severe  left foraminal narrowing at C4-5. Moderate left foraminal stenosis is present at C5-6. Mild right foraminal narrowing is present at C6-7. Moderate to severe right foraminal narrowing is present at C7-T1. Upper chest: The lung apices are clear. IMPRESSION: 1. No acute trauma to the head or cervical spine. 2. Normal CT appearance of the brain. 3. Multilevel spondylosis of the cervical spine as described. Electronically Signed   By: San Morelle M.D.   On: 04/22/2016 14:11     Subjective: - no chest pain, shortness of breath, no abdominal pain, nausea or vomiting.   Discharge Exam: Vitals:   04/23/16 1005 04/23/16 1205  BP: 124/61 126/61  Pulse: 91 94  Resp:  20  Temp:  99.8 F (37.7 C)   Vitals:   04/23/16 0105 04/23/16 0638 04/23/16 1005 04/23/16 1205  BP: 112/68 117/66 124/61 126/61  Pulse:  73 91 94  Resp:  18  20  Temp:  98.3 F (36.8 C)  99.8 F (37.7 C)  TempSrc:  Oral  Oral  SpO2:  98%  98%  Weight:  76.8 kg (169 lb 4.8 oz)    Height:        General: Pt is alert, awake, not in acute distress Cardiovascular: RRR, S1/S2 +, no rubs, no gallops Respiratory: CTA bilaterally, no wheezing, no rhonchi Abdominal: Soft, NT, ND, bowel sounds + Extremities: no edema, no cyanosis    The results of significant diagnostics from this hospitalization (including imaging, microbiology, ancillary and laboratory) are listed below for reference.     Microbiology: No results found for this or any previous visit (from the past 240 hour(s)).   Labs: BNP (last 3 results)  Recent Labs  04/22/16 1914  BNP [id number]*   Basic Metabolic Panel:  Recent Labs Lab 04/22/16 1045 04/22/16 1311 04/23/16 0434  NA 137 135 139  K 3.7 3.5 4.1  CL 101 100* 105  CO2 26 25 28   GLUCOSE 133* 133* 99  BUN 10 11 8   CREATININE 0.79 0.77 0.68  CALCIUM 8.9 8.7* 8.5*  MG  --  1.6*  --    Liver Function Tests:  Recent Labs Lab 04/22/16 1311  AST 15  ALT 13*  ALKPHOS 59  BILITOT 0.7    PROT 6.7  ALBUMIN 3.5   No results for input(s): LIPASE, AMYLASE in the last 168 hours. No results for input(s): AMMONIA in the last 168 hours. CBC:  Recent Labs Lab 04/22/16 1045 04/22/16 1311 04/23/16 0434  WBC 14.1* 13.4* 11.1*  NEUTROABS  --  11.4*  --   HGB 10.9* 10.6* 9.6*  HCT 34.8* 33.7* 30.9*  MCV 75.3* 75.6* 77.6*  PLT 338 325 271   Cardiac Enzymes:  Recent Labs Lab 04/22/16 1721 04/22/16 1914 04/22/16 2323 04/23/16 0434  TROPONINI <0.03 0.03* <0.03 <0.03  BNP: Invalid input(s): POCBNP CBG:  Recent Labs Lab 04/22/16 1033 04/22/16 1321 04/22/16 2148 04/23/16 0653 04/23/16 1140  GLUCAP 112* 113* 160* 84 111*   D-Dimer No results for input(s): DDIMER in the last 72 hours. Hgb A1c  Recent Labs  04/22/16 1914  HGBA1C 6.5*   Lipid Profile No results for input(s): CHOL, HDL, LDLCALC, TRIG, CHOLHDL, LDLDIRECT in the last 72 hours. Thyroid function studies No results for input(s): TSH, T4TOTAL, T3FREE, THYROIDAB in the last 72 hours.  Invalid input(s): FREET3 Anemia work up No results for input(s): VITAMINB12, FOLATE, FERRITIN, TIBC, IRON, RETICCTPCT in the last 72 hours. Urinalysis    Component Value Date/Time   COLORURINE YELLOW 04/22/2016 1240   APPEARANCEUR HAZY (A) 04/22/2016 1240   LABSPEC 1.018 04/22/2016 1240   PHURINE 7.0 04/22/2016 1240   GLUCOSEU NEGATIVE 04/22/2016 1240   HGBUR NEGATIVE 04/22/2016 [place] 04/22/2016 1240   BILIRUBINUR neg 12/16/2014 1554   KETONESUR 20 (A) 04/22/2016 1240   PROTEINUR NEGATIVE 04/22/2016 1240   UROBILINOGEN 0.2 12/16/2014 1554   UROBILINOGEN 1.0 08/17/2013 1016   NITRITE POSITIVE (A) 04/22/2016 1240   LEUKOCYTESUR NEGATIVE 04/22/2016 1240   Sepsis Labs Invalid input(s): PROCALCITONIN,  WBC,  LACTICIDVEN Microbiology No results found for this or any previous visit (from the past 240 hour(s)).   Time coordinating discharge: Over 30 minutes  SIGNED:  Marzetta Board, MD  [facility] 04/23/2016, 1:54 PM Pager [phone]  If 7PM-7AM, please contact night-coverage [email or web address] Password [email or web address]

## 2016-04-24 ENCOUNTER — Telehealth: Payer: Self-pay

## 2016-04-24 LAB — URINE CULTURE

## 2016-04-24 NOTE — Telephone Encounter (Signed)
LM requesting return call to complete TCM and schedule hospital follow up with PCP.

## 2016-04-25 ENCOUNTER — Encounter: Payer: Self-pay | Admitting: Family Medicine

## 2016-04-25 ENCOUNTER — Ambulatory Visit (INDEPENDENT_AMBULATORY_CARE_PROVIDER_SITE_OTHER): Payer: Medicare Other | Admitting: Family Medicine

## 2016-04-25 VITALS — BP 128/64 | HR 77 | Temp 98.8°F | Resp 20 | Wt 172.5 lb

## 2016-04-25 DIAGNOSIS — R790 Abnormal level of blood mineral: Secondary | ICD-10-CM

## 2016-04-25 DIAGNOSIS — Z1321 Encounter for screening for nutritional disorder: Secondary | ICD-10-CM | POA: Diagnosis not present

## 2016-04-25 DIAGNOSIS — E559 Vitamin D deficiency, unspecified: Secondary | ICD-10-CM

## 2016-04-25 DIAGNOSIS — R55 Syncope and collapse: Secondary | ICD-10-CM | POA: Diagnosis not present

## 2016-04-25 DIAGNOSIS — Z9289 Personal history of other medical treatment: Secondary | ICD-10-CM | POA: Diagnosis not present

## 2016-04-25 DIAGNOSIS — D649 Anemia, unspecified: Secondary | ICD-10-CM

## 2016-04-25 LAB — CBC WITH DIFFERENTIAL/PLATELET
Basophils Absolute: 69 cells/uL (ref 0–200)
Basophils Relative: 1 %
EOS PCT: 4 %
Eosinophils Absolute: 276 cells/uL (ref 15–500)
HCT: 32.8 % — ABNORMAL LOW (ref 35.0–45.0)
Hemoglobin: 10.3 g/dL — ABNORMAL LOW (ref 11.7–15.5)
LYMPHS ABS: 2622 {cells}/uL (ref 850–3900)
Lymphocytes Relative: 38 %
MCH: 24.1 pg — ABNORMAL LOW (ref 27.0–33.0)
MCHC: 31.4 g/dL — AB (ref 32.0–36.0)
MCV: 76.6 fL — ABNORMAL LOW (ref 80.0–100.0)
MPV: 9.8 fL (ref 7.5–12.5)
Monocytes Absolute: 621 cells/uL (ref 200–950)
Monocytes Relative: 9 %
NEUTROS ABS: 3312 {cells}/uL (ref 1500–7800)
NEUTROS PCT: 48 %
PLATELETS: 361 10*3/uL (ref 140–400)
RBC: 4.28 MIL/uL (ref 3.80–5.10)
RDW: 16.2 % — ABNORMAL HIGH (ref 11.0–15.0)
WBC: 6.9 10*3/uL (ref 3.8–10.8)

## 2016-04-25 LAB — IRON AND TIBC
%SAT: 6 % — ABNORMAL LOW (ref 11–50)
IRON: 24 ug/dL — AB (ref 45–160)
TIBC: 383 ug/dL (ref 250–450)
UIBC: 359 ug/dL (ref 125–400)

## 2016-04-25 LAB — TSH: TSH: 1.82 m[IU]/L

## 2016-04-25 LAB — MAGNESIUM: Magnesium: 1.6 mg/dL (ref 1.5–2.5)

## 2016-04-25 LAB — IRON: IRON: 21 ug/dL — AB (ref 42–145)

## 2016-04-25 NOTE — Patient Instructions (Addendum)
I will call with results of all labs.  It was nice to see you today.  Consider restarting statin.

## 2016-04-25 NOTE — Progress Notes (Signed)
Priscilla [name] , 04/20/1950, 66 y.o., female MRN: [id number] Patient Care Team    Relationship Specialty Notifications Start End  Ma Hillock, DO PCP - General Family Medicine  07/19/15    Comment: transfer to [place], MD Consulting Physician Orthopedic Surgery  08/06/15   Philemon Kingdom, MD Consulting Physician Internal Medicine  08/06/15    Comment: endocrine  Mcarthur Rossetti, MD Consulting Physician Orthopedic Surgery  08/06/15   Bo Merino, MD Consulting Physician Rheumatology  08/06/15     CC: TCM follow up after hospitalization.  Subjective:   Admitted: 04/22/2016 Discharged: 04/23/2016  Priscilla [name] is a 66 y.o. female with a past medical history of CAD, diabetes, hypertension, hyperlipidemia and chronic low back pain. She presented to the emergency room after having a syncopal episode on 04/22/2016. Patient states that she was having an increase in her back pain that day, and when she started to sit on the commode, she experienced a very sharp pain and then passed out. Her husband was in the other room during this event, and states she was not passed out on because she was artery awake when he made into the bathroom. He feels that her memory is fuzzy surrounding the events, and although she remembers waking up her memory doesn't fully return until she was in the emergency room. He feels her speech was slow throughout that evening. She was admitted and observed overnight. She states she feels back to her baseline currently. Patient underwent a 2-D echo which showed a normal ejection fracture and mild LVH. Blood gases were obtained, and in normal range. Is found to have mild hypocalcemia, hypomagnesemia, mildly elevated BNP, negative troponins. CBC 11.1, hemoglobin 9.6, hematocrit 30.9, MCV 77.6, MCH 24.1, RDW 16.2. Last colonoscopy 10/26/2008 and was normal. Patient has history of LAP-BAND procedure, and does not tolerate meats well. She does not take  a multivitamin. She denies blood loss, hematochezia or melena. She had stopped her statin, secondary to concerns of side effects. He is now wondering if what she experienced was a TIA. CT cervical spine and head was without acute trauma, normal CT of the brain. Chest x-ray was clear. Upon discharge patient was felt to have had a vasovagal episode secondary to worsening back pain.   No Known Allergies Social History  Substance Use Topics  . Smoking status: Never Smoker  . Smokeless tobacco: Never Used  . Alcohol use Yes     Comment: glass of wine-specially occasion   Past Medical History:  Diagnosis Date  . Anemia 12/14/2008  . Anxiety and depression 12/14/2008  . Asthma 11/12/2006  . CAD (coronary artery disease) 12/14/2008   hx transluminal coronary angioplasty  . Cataract   . Depression   . Diabetes mellitus with neuropathy ([place]) 12/14/2008  . Fibromyalgia 01/04/2007   sees rheuamtology  . Hyperlipemia 01/21/2010  . Hypertension 12/14/2008  . Leg edema 12/14/2008  . OSA (obstructive sleep apnea) 08/06/2008  . Osteoarthritis 12/14/2008  . Palpitations 11/12/2006   hx sinus tachy  . Psoriatic arthritis [facility])    sees rheumatology  . Rotator cuff injury    Past Surgical History:  Procedure Laterality Date  . ABDOMINAL HYSTERECTOMY    . CHOLECYSTECTOMY    . LAPAROSCOPIC GASTRIC BANDING    . PTCA    . WRIST SURGERY     LEFT   Family History  Problem Relation Age of Onset  . Other Mother     ALS  .  Arthritis Mother   . Stroke Father   . Alcohol abuse Father   . Diabetes Father   . Heart disease Father   . Early death Father 11  . Diabetes Sister   . Arthritis Sister   . COPD Brother   . Arthritis Maternal Aunt   . Diabetes Maternal Aunt   . Early death Maternal Aunt 57  . Diabetes Maternal Uncle   . Cancer Paternal Aunt   . COPD Paternal 8   . Heart disease Paternal Aunt   . Heart disease Paternal Uncle   . Alcohol abuse Paternal Grandfather   . Heart disease Paternal  Grandfather   . Early death Paternal Grandfather 64  . Stroke Paternal Grandfather    Allergies as of 04/25/2016   No Known Allergies     Medication List       Accurate as of 04/25/16  1:14 PM. Always use your most recent med list.          ALKA-SELTZER PLS NIGHT CLD/FLU 5-6.25-10-325 MG Caps Generic drug:  Phenyleph-Doxylamine-DM-APAP Take 2 capsules by mouth daily as needed (for cold symptoms).   aspirin 81 MG tablet Take 81 mg by mouth daily.   Biotin 5000 MCG Tabs Take 5,000 mcg by mouth daily.   calcium-vitamin D 250-100 MG-UNIT tablet Take 1 tablet by mouth 2 (two) times daily.   cyclobenzaprine 5 MG tablet Commonly known as:  FLEXERIL Take 1 tablet (5 mg total) by mouth 2 (two) times daily as needed for muscle spasms.   DULoxetine 60 MG capsule Commonly known as:  CYMBALTA TAKE 1 CAPSULE BY MOUTH EVERY DAY   FREESTYLE LITE test strip Generic drug:  glucose blood USE TO TEST BLOOD SUGAR TWICE A DAY   glucagon 1 MG injection Commonly known as:  GLUCAGON EMERGENCY Inject 1 mg into the muscle once as needed.   insulin aspart 100 UNIT/ML FlexPen Commonly known as:  NOVOLOG FLEXPEN Inject 6-8 Units into the skin 3 (three) times daily with meals. Plus sliding scale.   Insulin Detemir 100 UNIT/ML Pen Commonly known as:  LEVEMIR FLEXTOUCH Inject 35 Units into the skin at bedtime.   Insulin Pen Needle 32G X 6 MM Misc Commonly known as:  NOVOFINE Use with levemir pen as instructed   Insulin Syringe-Needle U-100 27G X 5/8" 1 ML Misc Commonly known as:  B-D INS SYR MICROFINE 1CC/27G Use 4 times daily prior to meals and at bedtime   lisinopril 2.5 MG tablet Commonly known as:  PRINIVIL,ZESTRIL TAKE 1 TABLET BY MOUTH EVERY DAY   metFORMIN 1000 MG tablet Commonly known as:  GLUCOPHAGE TAKE 1 TABLET BY MOUTH 2 TIMES DAILY WITH A MEAL   metoprolol tartrate 25 MG tablet Commonly known as:  LOPRESSOR TAKE 1 TABLET BY MOUTH 2 TIMES DAILY   multivitamin  tablet Take 1 tablet by mouth daily.   naproxen 375 MG tablet Commonly known as:  NAPROSYN Take 1 tablet (375 mg total) by mouth 2 (two) times daily as needed for moderate pain.   nitroGLYCERIN 0.4 MG SL tablet Commonly known as:  NITROSTAT Place 0.4 mg under the tongue every 5 (five) minutes as needed for chest pain. Reported on 07/19/2015   PROAIR HFA 108 (90 Base) MCG/ACT inhaler Generic drug:  albuterol inhale 2 puffs by mouth every 6 hours if needed   zolpidem 10 MG tablet Commonly known as:  AMBIEN TAKE 1 TABLET BY MOUTH AT BEDTIME AS NEEDED   Zoster Vaccine Live (PF) 19400 UNT/0.65ML injection  Commonly known as:  ZOSTAVAX Inject 19,400 Units into the skin once.       No results found for this or any previous visit (from the past 24 hour(s)). No results found.   ROS: Negative, with the exception of above mentioned in HPI   Objective:  BP 128/64 (BP Location: Right Arm, Patient Position: Sitting, Cuff Size: Normal)   Pulse 77   Temp 98.8 F (37.1 C)   Resp 20   Wt 172 lb 8 oz (78.2 kg)   SpO2 98%   BMI 29.61 kg/m  Body mass index is 29.61 kg/m. Gen: Afebrile. No acute distress. Nontoxic in appearance, well developed, well nourished.  HENT: AT. [place]. MMM, no oral lesions. Eyes:Pupils Equal Round Reactive to light, Extraocular movements intact,  Conjunctiva without redness, discharge or icterus. Neck/lymp/endocrine: Supple,no lymphadenopathy CV: RRR, no edema Chest: CTAB, no wheeze or crackles. Good air movement, normal resp effort.  Abd: Soft. NTND. BS present.  Skin: No rashes, purpura or petechiae.  Neuro:  Normal gait. PERLA. EOMi. Alert. Oriented x3  Psych: Normal affect, dress and demeanor. Normal speech. Normal thought content and judgment.  Assessment/Plan: Priscilla [name] is a 66 y.o. female present for transition of care management OV recent hospitalization for syncopal episode. Syncope, unspecified syncope type Anemia, unspecified  type Hypocalcemia Vitamin D deficiency Recent hospitalization - Unknown etiology of syncope, possibly secondary to vasovagal event secondary to pain. All lab work and imaging studies were reviewed today with patient. It appears she does have new onset anemia, possibly secondary to iron deficiency. She does have a lap band procedure, and does not consume many meats. Discussed with patient would need to rule out blood loss as a possible cause as well. - CBC w/Diff - TSH - PTH, Intact and Calcium - Magnesium - Iron Binding Cap (TIBC) - Iron - Vitamin D (25 hydroxy) - Hemoccult Cards (X3 cards); Future - Follow-up/referrals dependent upon lab results.   electronically signed by:  Howard Pouch, DO  [place]

## 2016-04-26 LAB — VITAMIN D 25 HYDROXY (VIT D DEFICIENCY, FRACTURES): VIT D 25 HYDROXY: 22 ng/mL — AB (ref 30–100)

## 2016-04-26 LAB — PTH, INTACT AND CALCIUM
Calcium: 9.3 mg/dL (ref 8.6–10.4)
PTH: 33 pg/mL (ref 14–64)

## 2016-04-28 ENCOUNTER — Telehealth: Payer: Self-pay | Admitting: Family Medicine

## 2016-04-28 DIAGNOSIS — Z9884 Bariatric surgery status: Secondary | ICD-10-CM

## 2016-04-28 DIAGNOSIS — D509 Iron deficiency anemia, unspecified: Secondary | ICD-10-CM

## 2016-04-28 MED ORDER — POLYSACCHARIDE IRON COMPLEX 15 MG/ML PO LIQD: 15.0000 mg | Freq: Every day | ORAL | 0 refills | Status: DC

## 2016-04-28 NOTE — Telephone Encounter (Signed)
Left message on voicemail for patient to return call. 

## 2016-04-28 NOTE — Telephone Encounter (Signed)
Please call pt: - her labs resulted with low vit d, and iron deficiency anemia. - Please make sure she is completing her FOBT cards provided to her at appt.  - I suspect this may be secondary to diet/lap band, but we need to make certain not caused by blood loss.  - I have called in an oral supplement, which is a liquid for better absorption considering her history. Watch for constipation possibilities with use, use stool softener if needed to avoid.  - will need to retest in 12 weeks, provider appt.  If unable to correct iron deficit orally, will need to consider iron transfusions through hematology.

## 2016-04-28 NOTE — Telephone Encounter (Signed)
Patient notified and verbalized understanding. She will call back to schedule an appointment.

## 2016-05-03 ENCOUNTER — Other Ambulatory Visit: Payer: Medicare Other

## 2016-05-03 ENCOUNTER — Telehealth: Payer: Self-pay

## 2016-05-03 ENCOUNTER — Encounter: Payer: Self-pay | Admitting: Family Medicine

## 2016-05-03 ENCOUNTER — Ambulatory Visit (INDEPENDENT_AMBULATORY_CARE_PROVIDER_SITE_OTHER): Payer: Medicare Other | Admitting: Family Medicine

## 2016-05-03 VITALS — BP 116/79 | HR 76 | Temp 98.3°F | Resp 20 | Wt 171.5 lb

## 2016-05-03 DIAGNOSIS — J209 Acute bronchitis, unspecified: Secondary | ICD-10-CM

## 2016-05-03 DIAGNOSIS — J029 Acute pharyngitis, unspecified: Secondary | ICD-10-CM

## 2016-05-03 DIAGNOSIS — D509 Iron deficiency anemia, unspecified: Secondary | ICD-10-CM | POA: Diagnosis not present

## 2016-05-03 DIAGNOSIS — R55 Syncope and collapse: Secondary | ICD-10-CM

## 2016-05-03 DIAGNOSIS — R790 Abnormal level of blood mineral: Secondary | ICD-10-CM

## 2016-05-03 DIAGNOSIS — Z1321 Encounter for screening for nutritional disorder: Secondary | ICD-10-CM

## 2016-05-03 DIAGNOSIS — D649 Anemia, unspecified: Secondary | ICD-10-CM

## 2016-05-03 DIAGNOSIS — E559 Vitamin D deficiency, unspecified: Secondary | ICD-10-CM

## 2016-05-03 LAB — HEMOCCULT SLIDES (X 3 CARDS)
Fecal Occult Blood: NEGATIVE
OCCULT 1: NEGATIVE
OCCULT 2: NEGATIVE
OCCULT 3: NEGATIVE
OCCULT 4: NEGATIVE
OCCULT 5: NEGATIVE

## 2016-05-03 MED ORDER — DOXYCYCLINE HYCLATE 100 MG PO TABS: 100.0000 mg | ORAL_TABLET | Freq: Two times a day (BID) | ORAL | 0 refills | Status: DC

## 2016-05-03 MED ORDER — ALBUTEROL SULFATE HFA 108 (90 BASE) MCG/ACT IN AERS
INHALATION_SPRAY | RESPIRATORY_TRACT | 6 refills | Status: DC
Start: 2016-05-03 — End: 2016-05-05

## 2016-05-03 MED ORDER — POLYSACCHARIDE IRON COMPLEX 150 MG PO CAPS: 150.0000 mg | ORAL_CAPSULE | Freq: Every day | ORAL | 2 refills | Status: DC

## 2016-05-03 NOTE — Patient Instructions (Signed)
Start mucinex.  Rest and hydrate. Start doxycyline every 12 hours 10 days.    You sound like you have bronchitis.

## 2016-05-03 NOTE — Progress Notes (Signed)
Priscilla [name] , [birth year], 66 y.o., female MRN: [id number] Patient Care Team    Relationship Specialty Notifications Start End  Ma Hillock, DO PCP - General Family Medicine  07/19/15    Comment: transfer to [place], MD Consulting Physician Orthopedic Surgery  08/06/15   Philemon Kingdom, MD Consulting Physician Internal Medicine  08/06/15    Comment: endocrine  Mcarthur Rossetti, MD Consulting Physician Orthopedic Surgery  08/06/15   Bo Merino, MD Consulting Physician Rheumatology  08/06/15     CC: sore throat  Subjective: Pt presents for an acute OV with complaints of sinus pressure, headache, rhonorrhea, cough, wheezing occasionally at start about 6 days ago. She reports a mild sore throat started about 3 days ago. She denies fever, chills, nausea, vomiting or diarrhea. She is helping take care of her new grandbaby and is concerned she has the flu. Patient has had her flu shot this year. He has a history of mild asthma, that can flare with upper respiratory infections. She does not have a current inhaler at home.  No Known Allergies Social History  Substance Use Topics  . Smoking status: Never Smoker  . Smokeless tobacco: Never Used  . Alcohol use Yes     Comment: glass of wine-specially occasion   Past Medical History:  Diagnosis Date  . Anemia 12/14/2008  . Anxiety and depression 12/14/2008  . Asthma 11/12/2006  . CAD (coronary artery disease) 12/14/2008   hx transluminal coronary angioplasty  . Cataract   . Depression   . Diabetes mellitus with neuropathy ([place]) 12/14/2008  . Fibromyalgia 01/04/2007   sees rheuamtology  . Hyperlipemia 01/21/2010  . Hypertension 12/14/2008  . Leg edema 12/14/2008  . OSA (obstructive sleep apnea) 08/06/2008  . Osteoarthritis 12/14/2008  . Palpitations 11/12/2006   hx sinus tachy  . Psoriatic arthritis [facility])    sees rheumatology  . Rotator cuff injury    Past Surgical History:  Procedure Laterality Date  . ABDOMINAL  HYSTERECTOMY    . CHOLECYSTECTOMY    . LAPAROSCOPIC GASTRIC BANDING    . PTCA    . WRIST SURGERY     LEFT   Family History  Problem Relation Age of Onset  . Other Mother     ALS  . Arthritis Mother   . Stroke Father   . Alcohol abuse Father   . Diabetes Father   . Heart disease Father   . Early death Father 50  . Diabetes Sister   . Arthritis Sister   . COPD Brother   . Arthritis Maternal Aunt   . Diabetes Maternal Aunt   . Early death Maternal Aunt 19  . Diabetes Maternal Uncle   . Cancer Paternal Aunt   . COPD Paternal 70   . Heart disease Paternal Aunt   . Heart disease Paternal Uncle   . Alcohol abuse Paternal Grandfather   . Heart disease Paternal Grandfather   . Early death Paternal Grandfather 85  . Stroke Paternal Grandfather    Allergies as of 05/03/2016   No Known Allergies     Medication List       Accurate as of 05/03/16  3:59 PM. Always use your most recent med list.          aspirin 81 MG tablet Take 81 mg by mouth daily.   Biotin 5000 MCG Tabs Take 5,000 mcg by mouth daily.   calcium-vitamin D 250-100 MG-UNIT tablet Take 1 tablet by mouth 2 (two)  times daily.   cyclobenzaprine 5 MG tablet Commonly known as:  FLEXERIL Take 1 tablet (5 mg total) by mouth 2 (two) times daily as needed for muscle spasms.   DULoxetine 60 MG capsule Commonly known as:  CYMBALTA TAKE 1 CAPSULE BY MOUTH EVERY DAY   FREESTYLE LITE test strip Generic drug:  glucose blood USE TO TEST BLOOD SUGAR TWICE A DAY   glucagon 1 MG injection Commonly known as:  GLUCAGON EMERGENCY Inject 1 mg into the muscle once as needed.   insulin aspart 100 UNIT/ML FlexPen Commonly known as:  NOVOLOG FLEXPEN Inject 6-8 Units into the skin 3 (three) times daily with meals. Plus sliding scale.   Insulin Detemir 100 UNIT/ML Pen Commonly known as:  LEVEMIR FLEXTOUCH Inject 35 Units into the skin at bedtime.   Insulin Pen Needle 32G X 6 MM Misc Commonly known as:  NOVOFINE Use  with levemir pen as instructed   Insulin Syringe-Needle U-100 27G X 5/8" 1 ML Misc Commonly known as:  B-D INS SYR MICROFINE 1CC/27G Use 4 times daily prior to meals and at bedtime   lisinopril 2.5 MG tablet Commonly known as:  PRINIVIL,ZESTRIL TAKE 1 TABLET BY MOUTH EVERY DAY   metFORMIN 1000 MG tablet Commonly known as:  GLUCOPHAGE TAKE 1 TABLET BY MOUTH 2 TIMES DAILY WITH A MEAL   metoprolol tartrate 25 MG tablet Commonly known as:  LOPRESSOR TAKE 1 TABLET BY MOUTH 2 TIMES DAILY   multivitamin tablet Take 1 tablet by mouth daily.   naproxen 375 MG tablet Commonly known as:  NAPROSYN Take 1 tablet (375 mg total) by mouth 2 (two) times daily as needed for moderate pain.   nitroGLYCERIN 0.4 MG SL tablet Commonly known as:  NITROSTAT Place 0.4 mg under the tongue every 5 (five) minutes as needed for chest pain. Reported on 07/19/2015   Polysaccharide Iron Complex 15 MG/ML Liqd Take 15 mg by mouth daily.   PROAIR HFA 108 (90 Base) MCG/ACT inhaler Generic drug:  albuterol inhale 2 puffs by mouth every 6 hours if needed   zolpidem 10 MG tablet Commonly known as:  AMBIEN TAKE 1 TABLET BY MOUTH AT BEDTIME AS NEEDED       Results for orders placed or performed in visit on 05/03/16 (from the past 24 hour(s))  Hemoccult Cards (X3 cards)     Status: None   Collection Time: 05/03/16 10:00 AM  Result Value Ref Range   OCCULT 1 Negative Negative   OCCULT 2 Negative Negative   OCCULT 3 Negative Negative   OCCULT 4 Negative Negative   OCCULT 5 Negative Negative   Fecal Occult Blood Negative Negative   No results found.   ROS: Negative, with the exception of above mentioned in HPI   Objective:  BP 116/79 (BP Location: Left Arm, Patient Position: Sitting, Cuff Size: Normal)   Pulse 76   Temp 98.3 F (36.8 C)   Resp 20   Wt 171 lb 8 oz (77.8 kg)   SpO2 98%   BMI 29.44 kg/m  Body mass index is 29.44 kg/m. Gen: Afebrile. No acute distress. Nontoxic in appearance,  well developed, well nourished.  fatigued. HENT: AT. [place]. Bilateral TM visualized and normal limits. MMM, no oral lesions. Bilateral nares with erythema, drainage and swelling. Throat without erythema or exudates. Mild cough. Hoarseness present. Eyes:Pupils Equal Round Reactive to light, Extraocular movements intact,  Conjunctiva without redness, discharge or icterus. Neck/lymp/endocrine: Supple, no lymphadenopathy CV: RRR  Chest: CTAB, no wheeze or  crackles. Good air movement, normal resp effort.   Assessment/Plan: Priscilla [name] is a 66 y.o. female present for acute OV for  Sore throat - POCT rapid strep A: negative Acute bronchitis, unspecified organism - Rest, hydrate, Mucinex, doxycycline prescribed. - Albuterol inhaler refill, use as needed every 6 hours to 2 puffs while ill.  - Follow-up as needed.   electronically signed by:  Howard Pouch, DO  [place]

## 2016-05-03 NOTE — Telephone Encounter (Signed)
Polysaccharide Iron order was canceled at pharmacy.

## 2016-05-04 LAB — POCT RAPID STREP A (OFFICE): Rapid Strep A Screen: NEGATIVE

## 2016-05-05 ENCOUNTER — Other Ambulatory Visit: Payer: Self-pay | Admitting: *Deleted

## 2016-05-05 MED ORDER — ALBUTEROL SULFATE HFA 108 (90 BASE) MCG/ACT IN AERS: 2.0000 | INHALATION_SPRAY | Freq: Four times a day (QID) | RESPIRATORY_TRACT | 0 refills | Status: DC | PRN

## 2016-05-05 NOTE — Telephone Encounter (Signed)
Patient insurance declined coverage of Pro Air sent in Rx for ventolin per Dr Raoul Pitch.

## 2016-05-10 ENCOUNTER — Other Ambulatory Visit: Payer: Self-pay | Admitting: Rheumatology

## 2016-05-10 NOTE — Telephone Encounter (Signed)
Last Visit: 03/15/16 Next Visit: 08/17/16   Okay to refill Cymbalta?

## 2016-06-15 ENCOUNTER — Ambulatory Visit: Payer: Medicare Other | Admitting: Internal Medicine

## 2016-07-11 ENCOUNTER — Other Ambulatory Visit: Payer: Self-pay | Admitting: Internal Medicine

## 2016-07-11 ENCOUNTER — Telehealth: Payer: Self-pay | Admitting: Family Medicine

## 2016-07-11 NOTE — Telephone Encounter (Signed)
[facility] received prior authorization request for Provental.  In order to process request, they need to know if patient has tried and failed Xopenex.   Please return call at [phone].

## 2016-07-12 ENCOUNTER — Other Ambulatory Visit: Payer: Self-pay | Admitting: *Deleted

## 2016-07-12 MED ORDER — LEVALBUTEROL TARTRATE 45 MCG/ACT IN AERO: 1.0000 | INHALATION_SPRAY | Freq: Four times a day (QID) | RESPIRATORY_TRACT | 2 refills | Status: DC | PRN

## 2016-07-12 NOTE — Telephone Encounter (Signed)
Patient insurance will not cover proventil they sent documentation stating they cover xopenex. New Rx sent to patient pharmacy per Dr Raoul Pitch.

## 2016-07-12 NOTE — Telephone Encounter (Signed)
Rx for xopenex sent in per Dr Raoul Pitch

## 2016-07-31 ENCOUNTER — Encounter: Payer: Self-pay | Admitting: Family Medicine

## 2016-07-31 ENCOUNTER — Telehealth (INDEPENDENT_AMBULATORY_CARE_PROVIDER_SITE_OTHER): Payer: Self-pay | Admitting: Physical Medicine and Rehabilitation

## 2016-07-31 ENCOUNTER — Ambulatory Visit (INDEPENDENT_AMBULATORY_CARE_PROVIDER_SITE_OTHER): Payer: Medicare Other | Admitting: Family Medicine

## 2016-07-31 VITALS — BP 111/73 | HR 60 | Temp 98.4°F | Resp 18 | Ht 64.0 in | Wt 171.8 lb

## 2016-07-31 DIAGNOSIS — D509 Iron deficiency anemia, unspecified: Secondary | ICD-10-CM | POA: Diagnosis not present

## 2016-07-31 LAB — IRON AND TIBC
%SAT: 4 % — AB (ref 11–50)
Iron: 18 ug/dL — ABNORMAL LOW (ref 45–160)
TIBC: 410 ug/dL (ref 250–450)
UIBC: 392 ug/dL (ref 125–400)

## 2016-07-31 NOTE — Patient Instructions (Signed)
Glad you are feeling well.  We will get the labs collected today and if needed either continue the iron or refer you to specialist.

## 2016-07-31 NOTE — Progress Notes (Signed)
Priscilla [name] , 08-01-1950, 66 y.o., female MRN: [id number] Patient Care Team    Relationship Specialty Notifications Start End  Ma Hillock, DO PCP - General Family Medicine  07/19/15    Comment: transfer to [place], MD Consulting Physician Orthopedic Surgery  08/06/15   Philemon Kingdom, MD Consulting Physician Internal Medicine  08/06/15    Comment: endocrine  Mcarthur Rossetti, MD Consulting Physician Orthopedic Surgery  08/06/15   Bo Merino, MD Consulting Physician Rheumatology  08/06/15     CC: Anemia follow-up Subjective: Iron deficiency anemia: Patient presents for follow-up on her iron deficiency anemia. She states she is tolerating the iron supplementation. She is not symptomatic. She denies chest pain, shortness of breath or syncope. Patient was seen January 2018 after dizziness/syncopal episode of unknown cause. She was found at that time to be iron deficient with anemia. Fecal "blood tests were negative. Patient has denied any blood loss. She does have a lap band and finds it difficult to consume meats. 04/25/2016 iron 24, % saturation 6. Glipizide 10.3, hematocrit 32.8, MCV 76.6, MCH 24.1, MCHC 81.4, RD W 16.2.  Depression screen PHQ 2/9 08/06/2015  Decreased Interest 0  Down, Depressed, Hopeless 0  PHQ - 2 Score 0    No Known Allergies Social History  Substance Use Topics  . Smoking status: Never Smoker  . Smokeless tobacco: Never Used  . Alcohol use Yes     Comment: glass of wine-specially occasion   Past Medical History:  Diagnosis Date  . Anemia 12/14/2008  . Anxiety and depression 12/14/2008  . Asthma 11/12/2006  . CAD (coronary artery disease) 12/14/2008   hx transluminal coronary angioplasty  . Cataract   . Depression   . Diabetes mellitus with neuropathy ([place]) 12/14/2008  . Fibromyalgia 01/04/2007   sees rheuamtology  . Hyperlipemia 01/21/2010  . Hypertension 12/14/2008  . Leg edema 12/14/2008  . OSA (obstructive sleep apnea)  08/06/2008  . Osteoarthritis 12/14/2008  . Palpitations 11/12/2006   hx sinus tachy  . Psoriatic arthritis [facility])    sees rheumatology  . Rotator cuff injury    Past Surgical History:  Procedure Laterality Date  . ABDOMINAL HYSTERECTOMY    . CHOLECYSTECTOMY    . LAPAROSCOPIC GASTRIC BANDING    . PTCA    . WRIST SURGERY     LEFT   Family History  Problem Relation Age of Onset  . Other Mother     ALS  . Arthritis Mother   . Stroke Father   . Alcohol abuse Father   . Diabetes Father   . Heart disease Father   . Early death Father 51  . Diabetes Sister   . Arthritis Sister   . COPD Brother   . Arthritis Maternal Aunt   . Diabetes Maternal Aunt   . Early death Maternal Aunt 43  . Diabetes Maternal Uncle   . Cancer Paternal Aunt   . COPD Paternal 76   . Heart disease Paternal Aunt   . Heart disease Paternal Uncle   . Alcohol abuse Paternal Grandfather   . Heart disease Paternal Grandfather   . Early death Paternal Grandfather 22  . Stroke Paternal Grandfather    Allergies as of 07/31/2016   No Known Allergies     Medication List       Accurate as of 07/31/16  4:06 PM. Always use your most recent med list.          albuterol 108 (90  Base) MCG/ACT inhaler Commonly known as:  PROVENTIL HFA;VENTOLIN HFA Inhale 2 puffs into the lungs every 6 (six) hours as needed for wheezing or shortness of breath.   aspirin 81 MG tablet Take 81 mg by mouth daily.   Biotin 5000 MCG Tabs Take 5,000 mcg by mouth daily.   calcium-vitamin D 250-100 MG-UNIT tablet Take 1 tablet by mouth 2 (two) times daily.   cyclobenzaprine 5 MG tablet Commonly known as:  FLEXERIL Take 1 tablet (5 mg total) by mouth 2 (two) times daily as needed for muscle spasms.   DULoxetine 60 MG capsule Commonly known as:  CYMBALTA TAKE 1 CAPSULE BY MOUTH EVERY DAY   FREESTYLE LITE test strip Generic drug:  glucose blood USE TO TEST BLOOD SUGAR TWICE A DAY   glucagon 1 MG injection Commonly known as:   GLUCAGON EMERGENCY Inject 1 mg into the muscle once as needed.   insulin aspart 100 UNIT/ML FlexPen Commonly known as:  NOVOLOG FLEXPEN Inject 6-8 Units into the skin 3 (three) times daily with meals. Plus sliding scale.   Insulin Detemir 100 UNIT/ML Pen Commonly known as:  LEVEMIR FLEXTOUCH Inject 35 Units into the skin at bedtime.   Insulin Pen Needle 32G X 6 MM Misc Commonly known as:  NOVOFINE Use with levemir pen as instructed   Insulin Syringe-Needle U-100 27G X 5/8" 1 ML Misc Commonly known as:  B-D INS SYR MICROFINE 1CC/27G Use 4 times daily prior to meals and at bedtime   iron polysaccharides 150 MG capsule Commonly known as:  NIFEREX Take 1 capsule (150 mg total) by mouth daily.   levalbuterol 45 MCG/ACT inhaler Commonly known as:  XOPENEX HFA Inhale 1-2 puffs into the lungs every 6 (six) hours as needed for wheezing.   lisinopril 2.5 MG tablet Commonly known as:  PRINIVIL,ZESTRIL TAKE 1 TABLET BY MOUTH EVERY DAY   metFORMIN 1000 MG tablet Commonly known as:  GLUCOPHAGE TAKE 1 TABLET BY MOUTH 2 TIMES DAILY WITH A MEAL   metoprolol tartrate 25 MG tablet Commonly known as:  LOPRESSOR TAKE 1 TABLET BY MOUTH 2 TIMES DAILY   multivitamin tablet Take 1 tablet by mouth daily.   naproxen 375 MG tablet Commonly known as:  NAPROSYN Take 1 tablet (375 mg total) by mouth 2 (two) times daily as needed for moderate pain.   nitroGLYCERIN 0.4 MG SL tablet Commonly known as:  NITROSTAT Place 0.4 mg under the tongue every 5 (five) minutes as needed for chest pain. Reported on 07/19/2015   zolpidem 10 MG tablet Commonly known as:  AMBIEN TAKE 1 TABLET BY MOUTH AT BEDTIME AS NEEDED       No results found for this or any previous visit (from the past 24 hour(s)). No results found.   ROS: Negative, with the exception of above mentioned in HPI   Objective:  BP 111/73 (BP Location: Left Arm, Patient Position: Sitting, Cuff Size: Normal)   Pulse 60   Temp 98.4 F  (36.9 C)   Resp 18   Ht 5\' 4"  (1.626 m)   Wt 171 lb 12 oz (77.9 kg)   SpO2 96%   BMI 29.48 kg/m  Body mass index is 29.48 kg/m. Gen: Afebrile. No acute distress. Nontoxic in appearance, well developed, well nourished.  HENT: AT. [place].  MMM Eyes:Pupils Equal Round Reactive to light, Extraocular movements intact,  Conjunctiva without redness, discharge or icterus. CV: RRR, no edema Chest: CTAB, no wheeze or crackles. Good air movement, normal resp effort.  Neuro:  Normal gait. PERLA. EOMi. Alert. Oriented x3   Assessment/Plan: ROSEY EIDE is a 66 y.o. female present for OV for  Iron deficiency anemia, unspecified iron deficiency anemia type - Continue iron supplementation, rechecked levels today. If unable to obtain normal levels via oral supplementation may need hematologist evaluation. Patient is now asymptomatic, so hopefully referral will not be needed. She was encouraged to attempt to eat more iron rich foods. - CBC w/Diff - Iron Binding Cap (TIBC) - Ferritin - Follow-up depending upon labs.  Reviewed expectations re: course of current medical issues.  Discussed self-management of symptoms.  Outlined signs and symptoms indicating need for more acute intervention.  Patient verbalized understanding and all questions were answered.  Patient received an After-Visit Summary.   electronically signed by:  Howard Pouch, DO  [place]

## 2016-07-31 NOTE — Telephone Encounter (Signed)
Left message for pt to call back for scheduling.

## 2016-07-31 NOTE — Telephone Encounter (Signed)
Unless new trauma or pain shoot down to foot then ok to repeat facets, otherwise OV

## 2016-08-01 LAB — CBC WITH DIFFERENTIAL/PLATELET
BASOS ABS: 0.1 10*3/uL (ref 0.0–0.1)
Basophils Relative: 0.9 % (ref 0.0–3.0)
Eosinophils Absolute: 0.4 10*3/uL (ref 0.0–0.7)
Eosinophils Relative: 5.4 % — ABNORMAL HIGH (ref 0.0–5.0)
HCT: 33.8 % — ABNORMAL LOW (ref 36.0–46.0)
Hemoglobin: 10.8 g/dL — ABNORMAL LOW (ref 12.0–15.0)
LYMPHS ABS: 2.7 10*3/uL (ref 0.7–4.0)
Lymphocytes Relative: 34.2 % (ref 12.0–46.0)
MCHC: 31.9 g/dL (ref 30.0–36.0)
MCV: 75.2 fl — ABNORMAL LOW (ref 78.0–100.0)
MONO ABS: 0.7 10*3/uL (ref 0.1–1.0)
Monocytes Relative: 8.9 % (ref 3.0–12.0)
NEUTROS PCT: 50.6 % (ref 43.0–77.0)
Neutro Abs: 3.9 10*3/uL (ref 1.4–7.7)
Platelets: 370 10*3/uL (ref 150.0–400.0)
RBC: 4.5 Mil/uL (ref 3.87–5.11)
RDW: 17.3 % — ABNORMAL HIGH (ref 11.5–15.5)
WBC: 7.8 10*3/uL (ref 4.0–10.5)

## 2016-08-01 LAB — FERRITIN: FERRITIN: 5.2 ng/mL — AB (ref 10.0–291.0)

## 2016-08-01 NOTE — Telephone Encounter (Signed)
Scheduled for 08/10/16 at 1500 with a driver.

## 2016-08-02 ENCOUNTER — Encounter: Payer: Self-pay | Admitting: *Deleted

## 2016-08-02 ENCOUNTER — Telehealth: Payer: Self-pay | Admitting: Family Medicine

## 2016-08-02 DIAGNOSIS — D508 Other iron deficiency anemias: Secondary | ICD-10-CM

## 2016-08-02 NOTE — Telephone Encounter (Signed)
Please call pt: - her iron studies are not improved from last visit, they are actually a little lower.  - I have placed a referral to hematologist to further evaluate and provide recommendations for her since oral supplementation has not helped.    - Documentation: no blood loss, hemoccult cards is negative, colonoscopy UTD 2010- normal, normal kidney function. h/o laparoscopic gastric banding.

## 2016-08-08 ENCOUNTER — Ambulatory Visit: Payer: Medicare Other | Admitting: Internal Medicine

## 2016-08-10 ENCOUNTER — Encounter (INDEPENDENT_AMBULATORY_CARE_PROVIDER_SITE_OTHER): Payer: Self-pay | Admitting: Physical Medicine and Rehabilitation

## 2016-08-10 ENCOUNTER — Ambulatory Visit (INDEPENDENT_AMBULATORY_CARE_PROVIDER_SITE_OTHER): Payer: Medicare Other

## 2016-08-10 ENCOUNTER — Ambulatory Visit (INDEPENDENT_AMBULATORY_CARE_PROVIDER_SITE_OTHER): Payer: Medicare Other | Admitting: Physical Medicine and Rehabilitation

## 2016-08-10 VITALS — BP 124/79 | HR 75 | Temp 98.4°F

## 2016-08-10 DIAGNOSIS — M47816 Spondylosis without myelopathy or radiculopathy, lumbar region: Secondary | ICD-10-CM

## 2016-08-10 MED ORDER — LIDOCAINE HCL (PF) 1 % IJ SOLN
2.0000 mL | Freq: Once | INTRAMUSCULAR | Status: AC
  Administered 2016-08-10: 2 mL

## 2016-08-10 MED ORDER — IIOPAMIDOL (ISOVUE-250) INJECTION 51%
3.0000 mL | Freq: Once | INTRAVENOUS | Status: AC
  Administered 2016-08-10: 3 mL
  Filled 2016-08-10: qty 50

## 2016-08-10 MED ORDER — METHYLPREDNISOLONE ACETATE 80 MG/ML IJ SUSP
80.0000 mg | Freq: Once | INTRAMUSCULAR | Status: AC
  Administered 2016-08-10: 80 mg

## 2016-08-10 NOTE — Patient Instructions (Signed)

## 2016-08-10 NOTE — Procedures (Signed)
Lumbar Facet Joint Intra-Articular Injection(s) with Fluoroscopic Guidance  Patient: Priscilla [name]      Date of Birth: Aug 24, 1950 MRN: [id number] PCP: Howard Pouch, DO      Visit Date: 08/10/2016   Universal Protocol:    Date/Time: 05/03/183:50 PM  Consent Given By: the patient  Position: PRONE   Additional Comments: Vital signs were monitored before and after the procedure. Patient was prepped and draped in the usual sterile fashion. The correct patient, procedure, and site was verified.   Injection Procedure Details:  Procedure Site One Meds Administered:  Meds ordered this encounter  Medications  . lidocaine (PF) (XYLOCAINE) 1 % injection 2 mL  . iopamidol (ISOVUE-250) 51 % injection 3 mL  . methylPREDNISolone acetate (DEPO-MEDROL) injection 80 mg     Laterality: Left  Location/Site:  L4-L5  Needle size: 22 guage  Needle type: Spinal  Needle Placement: Articular  Findings:  -Contrast Used: 1 mL iohexol 180 mg iodine/mL   -Comments: Excellent flow of contrast producing a partial arthrogram.  Procedure Details: The fluoroscope beam is vertically oriented in AP, and the inferior recess is visualized beneath the lower pole of the inferior apophyseal process, which represents the target point for needle insertion. When direct visualization is difficult the target point is located at the medial projection of the vertebral pedicle. The region overlying each aforementioned target is locally anesthetized with a 1 to 2 ml. volume of 1% Lidocaine without Epinephrine.   The spinal needle was inserted into each of the above mentioned facet joints using biplanar fluoroscopic guidance. A 0.25 to 0.5 ml. volume of Isovue-250 was injected and a partial facet joint arthrogram was obtained. A single spot film was obtained of the resulting arthrogram.    One to 1.25 ml of the steroid/anesthetic solution was then injected into each of the facet joints noted above.   Additional  Comments:  The patient tolerated the procedure well Dressing: Band-Aid    Post-procedure details: Patient was observed during the procedure. Post-procedure instructions were reviewed.  Patient left the clinic in stable condition.

## 2016-08-10 NOTE — Progress Notes (Deleted)
Left side low back pain into buttock for around 12 days. 5 days was extreme pain and had to get help getting out of chair. No injury. Denies pain radiating down leg

## 2016-08-10 NOTE — Progress Notes (Signed)
Priscilla [name] - 66 y.o. female MRN [id number]  Date of birth: 10-26-1950  Office Visit Note: Visit Date: 08/10/2016 PCP: Ma Hillock, DO Referred by: Ma Hillock, DO  Subjective: Chief Complaint  Patient presents with  . Lower Back - Pain   HPI: [name] is a 66 year old female with chronic worsening left-sided axial low back pain. As seen her in the past and completed bilateral facet joint blocks which were beneficial to a great degree. She has severe facet arthropathy bilaterally at L4-5-1 2016 MRI. Within a complete a left L4-5 facet joint block diagnostically and hopefully therapeutically. Her symptoms of worsened over the last several weeks to the point of severe almost debilitating back pain over the last week. She's tried anti-inflammatories without any relief.    ROS Otherwise per HPI.  Assessment & Plan: Visit Diagnoses:  1. Spondylosis without myelopathy or radiculopathy, lumbar region     Plan: Findings:  Left L4-5 facet joint block.    Meds & Orders:  Meds ordered this encounter  Medications  . lidocaine (PF) (XYLOCAINE) 1 % injection 2 mL  . iopamidol (ISOVUE-250) 51 % injection 3 mL  . methylPREDNISolone acetate (DEPO-MEDROL) injection 80 mg    Orders Placed This Encounter  Procedures  . Facet Injection  . XR C-ARM NO REPORT    Follow-up: Return if symptoms worsen or fail to improve.   Procedures: No procedures performed  Lumbar Facet Joint Intra-Articular Injection(s) with Fluoroscopic Guidance  Patient: Priscilla [name]      Date of Birth: 09/14/1950 MRN: [id number] PCP: Howard Pouch, DO      Visit Date: 08/10/2016   Universal Protocol:    Date/Time: 05/03/183:50 PM  Consent Given By: the patient  Position: PRONE   Additional Comments: Vital signs were monitored before and after the procedure. Patient was prepped and draped in the usual sterile fashion. The correct patient, procedure, and site was verified.   Injection  Procedure Details:  Procedure Site One Meds Administered:  Meds ordered this encounter  Medications  . lidocaine (PF) (XYLOCAINE) 1 % injection 2 mL  . iopamidol (ISOVUE-250) 51 % injection 3 mL  . methylPREDNISolone acetate (DEPO-MEDROL) injection 80 mg     Laterality: Left  Location/Site:  L4-L5  Needle size: 22 guage  Needle type: Spinal  Needle Placement: Articular  Findings:  -Contrast Used: 1 mL iohexol 180 mg iodine/mL   -Comments: Excellent flow of contrast producing a partial arthrogram.  Procedure Details: The fluoroscope beam is vertically oriented in AP, and the inferior recess is visualized beneath the lower pole of the inferior apophyseal process, which represents the target point for needle insertion. When direct visualization is difficult the target point is located at the medial projection of the vertebral pedicle. The region overlying each aforementioned target is locally anesthetized with a 1 to 2 ml. volume of 1% Lidocaine without Epinephrine.   The spinal needle was inserted into each of the above mentioned facet joints using biplanar fluoroscopic guidance. A 0.25 to 0.5 ml. volume of Isovue-250 was injected and a partial facet joint arthrogram was obtained. A single spot film was obtained of the resulting arthrogram.    One to 1.25 ml of the steroid/anesthetic solution was then injected into each of the facet joints noted above.   Additional Comments:  The patient tolerated the procedure well Dressing: Band-Aid    Post-procedure details: Patient was observed during the procedure. Post-procedure instructions were reviewed.  Patient left the clinic in stable  condition.     Clinical History: L3-L4: Eccentric left mild broad-based disc bulge. Moderate bilateral facet arthropathy. Mild spinal stenosis. No evidence of neural foraminal stenosis.  L4-L5: Moderate broad-based disc bulge with a posterior annular fissure. Severe bilateral facet  arthropathy with ligamentum flavum infolding resulting in severe spinal stenosis and bilateral lateral recess stenosis. No evidence of neural foraminal stenosis.  L5-S1: Mild broad-based disc bulge. Moderate bilateral facet arthropathy. No evidence of neural foraminal stenosis. No central canal stenosis.  IMPRESSION: 1. Lumbar spine spondylosis most severe at L4-5. 2. At L4-5 there is a moderate broad-based disc bulge with a posterior annular fissure. Severe bilateral facet arthropathy with ligamentum flavum infolding resulting in severe spinal stenosis and bilateral lateral recess stenosis.   Electronically Signed   By: Kathreen Devoid   On: 10/31/2014 16:53  She reports that she has never smoked. She has never used smokeless tobacco.   Recent Labs  04/22/16 1914  HGBA1C 6.5*    Objective:  VS:  HT:    WT:   BMI:     BP:124/79  HR:75bpm  TEMP:98.4 F (36.9 C)(Oral)  RESP:96 % Physical Exam  Musculoskeletal:  Concordant low back pain with extension rotation of the lumbar spine. No pain over the greater trochanters and good distal strength.    Ortho Exam Imaging: No results found.  Past Medical/Family/Surgical/Social History: Medications & Allergies reviewed per EMR Patient Active Problem List   Diagnosis Date Noted  . Iron deficiency anemia 04/28/2016  . Syncope 04/22/2016  . Other psoriasis 03/14/2016  . Other insomnia 03/14/2016  . DDD (degenerative disc disease), cervical 03/14/2016  . Spondylosis without myelopathy or radiculopathy, lumbar region 01/31/2016  . Medicare annual wellness visit, initial 08/06/2015  . Uncontrolled type 2 diabetes mellitus with retinopathy, with long-term current use of insulin ([place]) 05/18/2015  . Anxiety and depression 06/25/2014  . Psoriatic arthritis - sees rheumatologist 02/20/2014  . Asthma 12/14/2008  . PERIPHERAL NEUROPATHY 08/06/2008  . Hyperlipemia 11/12/2006  . Essential hypertension 11/12/2006   Past Medical  History:  Diagnosis Date  . Anemia 12/14/2008  . Anxiety and depression 12/14/2008  . Asthma 11/12/2006  . CAD (coronary artery disease) 12/14/2008   hx transluminal coronary angioplasty  . Cataract   . Depression   . Diabetes mellitus with neuropathy ([place]) 12/14/2008  . Fibromyalgia 01/04/2007   sees rheuamtology  . Hyperlipemia 01/21/2010  . Hypertension 12/14/2008  . Leg edema 12/14/2008  . OSA (obstructive sleep apnea) 08/06/2008  . Osteoarthritis 12/14/2008  . Palpitations 11/12/2006   hx sinus tachy  . Psoriatic arthritis [facility])    sees rheumatology  . Rotator cuff injury    Family History  Problem Relation Age of Onset  . Other Mother     ALS  . Arthritis Mother   . Stroke Father   . Alcohol abuse Father   . Diabetes Father   . Heart disease Father   . Early death Father 63  . Diabetes Sister   . Arthritis Sister   . COPD Brother   . Arthritis Maternal Aunt   . Diabetes Maternal Aunt   . Early death Maternal Aunt 15  . Diabetes Maternal Uncle   . Cancer Paternal Aunt   . COPD Paternal 83   . Heart disease Paternal Aunt   . Heart disease Paternal Uncle   . Alcohol abuse Paternal Grandfather   . Heart disease Paternal Grandfather   . Early death Paternal Grandfather 58  . Stroke Paternal Grandfather  Past Surgical History:  Procedure Laterality Date  . ABDOMINAL HYSTERECTOMY    . CHOLECYSTECTOMY    . LAPAROSCOPIC GASTRIC BANDING    . PTCA    . WRIST SURGERY     LEFT   Social History   Occupational History  . Not on file.   Social History Main Topics  . Smoking status: Never Smoker  . Smokeless tobacco: Never Used  . Alcohol use Yes     Comment: glass of wine-specially occasion  . Drug use: No  . Sexual activity: No

## 2016-08-17 ENCOUNTER — Encounter: Payer: Self-pay | Admitting: Rheumatology

## 2016-08-17 ENCOUNTER — Ambulatory Visit (INDEPENDENT_AMBULATORY_CARE_PROVIDER_SITE_OTHER): Payer: Medicare Other | Admitting: Rheumatology

## 2016-08-17 VITALS — BP 132/72 | HR 70 | Resp 14 | Ht 64.0 in | Wt 170.0 lb

## 2016-08-17 DIAGNOSIS — R5383 Other fatigue: Secondary | ICD-10-CM | POA: Diagnosis not present

## 2016-08-17 DIAGNOSIS — M797 Fibromyalgia: Secondary | ICD-10-CM

## 2016-08-17 DIAGNOSIS — L408 Other psoriasis: Secondary | ICD-10-CM

## 2016-08-17 DIAGNOSIS — L4050 Arthropathic psoriasis, unspecified: Secondary | ICD-10-CM | POA: Diagnosis not present

## 2016-08-17 MED ORDER — DULOXETINE HCL 60 MG PO CPEP: ORAL_CAPSULE | ORAL | 5 refills | Status: DC

## 2016-08-17 NOTE — Progress Notes (Signed)
Office Visit Note  Patient: Priscilla [name]             Date of Birth: 1951/04/09           MRN: [id number]             PCP: Ma Hillock, DO Referring: Ma Hillock, DO Visit Date: 08/17/2016 Occupation: @GUAROCC @    Subjective:  Follow-up   History of Present Illness: Priscilla [name] is a 66 y.o. female  Last seen 03/15/2016 for psoriatic arthritis, psoriasis, and fms  Decreased ROM of left shoulder joint due to old rotator cuff tear. Pt was advised by Dr. Marlou Sa that repairing the rotator cuff tear would be difficult because one of the tendons is retracted and may not be able to be repaired properly. Instead, he advised that in the future, if the problem gets worse, they can consider left total shoulder joint replacement which would serve the patient that her. For now, patient is agreeable and will wait for left total shoulder joint surgery  Patient psoriasis and psoriatic arthritis is about the same as last visit. No new Ps lesions.  Receiving lumbar spine injections from dr. Ernestina Patches (last shot was done last week and it is helpful).   Activities of Daily Living:  Patient reports morning stiffness for 15 minutes.   Patient Denies nocturnal pain.  Difficulty dressing/grooming: Denies Difficulty climbing stairs: Reports Difficulty getting out of chair: Denies Difficulty using hands for taps, buttons, cutlery, and/or writing: Denies   Review of Systems  Constitutional: Positive for fatigue.  HENT: Negative for mouth sores and mouth dryness.   Eyes: Negative for dryness.  Respiratory: Negative for shortness of breath.   Gastrointestinal: Negative for constipation and diarrhea.  Musculoskeletal: Positive for myalgias and myalgias.  Skin: Negative for sensitivity to sunlight.  Psychiatric/Behavioral: Positive for sleep disturbance. Negative for decreased concentration.    PMFS History:  Patient Active Problem List   Diagnosis Date Noted  . Iron deficiency anemia  04/28/2016  . Syncope 04/22/2016  . Other psoriasis 03/14/2016  . Other insomnia 03/14/2016  . DDD (degenerative disc disease), cervical 03/14/2016  . Spondylosis without myelopathy or radiculopathy, lumbar region 01/31/2016  . Medicare annual wellness visit, initial 08/06/2015  . Uncontrolled type 2 diabetes mellitus with retinopathy, with long-term current use of insulin ([place]) 05/18/2015  . Anxiety and depression 06/25/2014  . Psoriatic arthritis - sees rheumatologist 02/20/2014  . Asthma 12/14/2008  . PERIPHERAL NEUROPATHY 08/06/2008  . Hyperlipemia 11/12/2006  . Essential hypertension 11/12/2006    Past Medical History:  Diagnosis Date  . Anemia 12/14/2008  . Anxiety and depression 12/14/2008  . Asthma 11/12/2006  . CAD (coronary artery disease) 12/14/2008   hx transluminal coronary angioplasty  . Cataract   . Depression   . Diabetes mellitus with neuropathy ([place]) 12/14/2008  . Fibromyalgia 01/04/2007   sees rheuamtology  . Hyperlipemia 01/21/2010  . Hypertension 12/14/2008  . Leg edema 12/14/2008  . OSA (obstructive sleep apnea) 08/06/2008  . Osteoarthritis 12/14/2008  . Palpitations 11/12/2006   hx sinus tachy  . Psoriatic arthritis [facility])    sees rheumatology  . Rotator cuff injury     Family History  Problem Relation Age of Onset  . Other Mother        ALS  . Arthritis Mother   . Stroke Father   . Alcohol abuse Father   . Diabetes Father   . Heart disease Father   . Early death Father 37  .  Diabetes Sister   . Arthritis Sister   . COPD Brother   . Arthritis Maternal Aunt   . Diabetes Maternal Aunt   . Early death Maternal Aunt 13  . Diabetes Maternal Uncle   . Cancer Paternal Aunt   . COPD Paternal 14   . Heart disease Paternal Aunt   . Heart disease Paternal Uncle   . Alcohol abuse Paternal Grandfather   . Heart disease Paternal Grandfather   . Early death Paternal Grandfather 58  . Stroke Paternal Grandfather    Past Surgical History:  Procedure Laterality Date   . ABDOMINAL HYSTERECTOMY    . CHOLECYSTECTOMY    . LAPAROSCOPIC GASTRIC BANDING    . PTCA    . WRIST SURGERY     LEFT   Social History   Social History Narrative   Married, [name]. 2 children name Abe People and Anderson Malta.   Some college, retired Engineer, manufacturing.   Drinks caffeine.   Take a daily vitamin.   Wears her seatbelt, exercises 3 times a week.   Smoke detector in the home.   Firearms in the home.   Feels safe in her relationships.     Objective: Vital Signs: BP 132/72   Pulse 70   Resp 14   Ht 5\' 4"  (1.626 m)   Wt 170 lb (77.1 kg)   BMI 29.18 kg/m    Physical Exam  Constitutional: She is oriented to person, place, and time. She appears well-developed and well-nourished.  HENT:  Head: Normocephalic and atraumatic.  Eyes: EOM are normal. Pupils are equal, round, and reactive to light.  Cardiovascular: Normal rate, regular rhythm and normal heart sounds.  Exam reveals no gallop and no friction rub.   No murmur heard. Pulmonary/Chest: Effort normal and breath sounds normal. She has no wheezes. She has no rales.  Abdominal: Soft. Bowel sounds are normal. She exhibits no distension. There is no tenderness. There is no guarding. No hernia.  Musculoskeletal: Normal range of motion. She exhibits no edema, tenderness or deformity.  Lymphadenopathy:    She has no cervical adenopathy.  Neurological: She is alert and oriented to person, place, and time. Coordination normal.  Skin: Skin is warm and dry. Capillary refill takes less than 2 seconds. No rash noted.  Psychiatric: She has a normal mood and affect. Her behavior is normal.  Nursing note and vitals reviewed.    Musculoskeletal Exam:  Full range of motion of all joints except for the left shoulder joint. Only has about 5 of abduction. (History of left rotator cuff tear which will not be repaired at this time. See history of present illness for full details.; Possibly a good candidate for left total shoulder  joint replacement through Dr. Marlou Sa in the future when appropriate). Grip strength is equal and strong bilaterally Fibromyalgia tender point was 18 out of 18 positive  CDAI Exam: CDAI Homunculus Exam:   Joint Counts:  CDAI Tender Joint count: 0 CDAI Swollen Joint count: 0  Global Assessments:  Patient Global Assessment: 4 Provider Global Assessment: 4  CDAI Calculated Score: 8   Investigation: No additional findings.   Imaging: Xr C-arm No Report  Result Date: 08/10/2016 Please see Notes or Procedures tab for imaging impression.   Speciality Comments: No specialty comments available.  Procedures:  No procedures performed Allergies: Patient has no known allergies.   Assessment / Plan:     Visit Diagnoses: Psoriatic arthritis - sees rheumatologist  Other psoriasis  Fibromyalgia  Fatigue, unspecified type  Plan: #1: Psoriatic arthritis and psoriasis. No active psoriatic arthritis pain or psoriatic lesions. Not on any medication at this time for psoriatic arthritis or psoriasis. Patient is aware that she can call us as soon as she starts noticing symptoms of psoriatic arthritis and psoriasis. She is agreeable.  #2: Fibromyalgia. 18 out of 18 tender points. Using Cymbalta with good relief.  #3: Insomnia. Patient takes half to one 10 mg Ambien daily at bedtime when necessary with excellent results. She has sufficient amount of medication at this time.  #4: Low vitamin D levels back on January 2018 labs. Patient has been taking vitamin D supplements but her vitamin D has not been rechecked.  #5: Patient has fatigue. Currently she's been diagnosed with anemia. They gave her iron supplements and the recheck of her CBC showed that her anemia but slightly worse instead of better. She has an appointment in 2 weeks for evaluation and treatment by hematologist oncologist. On that visit, I asked the patient to have the hematologist oncologist recheck her vitamin D  levels to be sure that they too have improved.  #6: Patient has had bone density done last year and states it was normal. This is monitored by another physician. We do not have DEXA report in chart. Pt will bring a copy of DEXA at next visit for our chart.   Orders: No orders of the defined types were placed in this encounter.  Meds ordered this encounter  Medications  . DULoxetine (CYMBALTA) 60 MG capsule    Sig: TAKE 1 CAPSULE BY MOUTH EVERY DAY    Dispense:  30 capsule    Refill:  5    Order Specific Question:   Supervising Provider    Answer:   Lyda Perone    Face-to-face time spent with patient was 30 minutes. 50% of time was spent in counseling and coordination of care.  Follow-Up Instructions: Return in about 5 months (around 01/17/2017) for PsA,Ps, FMS, FATIGUE,INSOMNIA.   Eliezer Lofts, PA-C  Note - This record has been created using [employer].  Chart creation errors have been sought, but may not always  have been located. Such creation errors do not reflect on  the standard of medical care.

## 2016-08-31 ENCOUNTER — Encounter: Payer: Self-pay | Admitting: Family

## 2016-08-31 ENCOUNTER — Ambulatory Visit: Payer: Medicare Other

## 2016-08-31 ENCOUNTER — Other Ambulatory Visit (HOSPITAL_BASED_OUTPATIENT_CLINIC_OR_DEPARTMENT_OTHER): Payer: Medicare Other

## 2016-08-31 ENCOUNTER — Ambulatory Visit (HOSPITAL_BASED_OUTPATIENT_CLINIC_OR_DEPARTMENT_OTHER): Payer: Medicare Other | Admitting: Family

## 2016-08-31 ENCOUNTER — Other Ambulatory Visit: Payer: Self-pay | Admitting: Family

## 2016-08-31 VITALS — BP 123/70 | HR 75 | Temp 98.7°F | Resp 16 | Wt 167.4 lb

## 2016-08-31 DIAGNOSIS — Z9884 Bariatric surgery status: Secondary | ICD-10-CM | POA: Diagnosis not present

## 2016-08-31 DIAGNOSIS — D508 Other iron deficiency anemias: Secondary | ICD-10-CM | POA: Diagnosis not present

## 2016-08-31 DIAGNOSIS — E559 Vitamin D deficiency, unspecified: Secondary | ICD-10-CM

## 2016-08-31 DIAGNOSIS — Z794 Long term (current) use of insulin: Secondary | ICD-10-CM | POA: Diagnosis not present

## 2016-08-31 DIAGNOSIS — E1165 Type 2 diabetes mellitus with hyperglycemia: Principal | ICD-10-CM

## 2016-08-31 DIAGNOSIS — K909 Intestinal malabsorption, unspecified: Secondary | ICD-10-CM

## 2016-08-31 DIAGNOSIS — E11319 Type 2 diabetes mellitus with unspecified diabetic retinopathy without macular edema: Secondary | ICD-10-CM

## 2016-08-31 DIAGNOSIS — D510 Vitamin B12 deficiency anemia due to intrinsic factor deficiency: Secondary | ICD-10-CM

## 2016-08-31 HISTORY — DX: Bariatric surgery status: Z98.84

## 2016-08-31 HISTORY — DX: Intestinal malabsorption, unspecified: K90.9

## 2016-08-31 LAB — CBC WITH DIFFERENTIAL (CANCER CENTER ONLY)
BASO#: 0.1 10*3/uL (ref 0.0–0.2)
BASO%: 0.6 % (ref 0.0–2.0)
EOS%: 1.3 % (ref 0.0–7.0)
Eosinophils Absolute: 0.1 10*3/uL (ref 0.0–0.5)
HCT: 38.2 % (ref 34.8–46.6)
HEMOGLOBIN: 12.1 g/dL (ref 11.6–15.9)
LYMPH#: 2.7 10*3/uL (ref 0.9–3.3)
LYMPH%: 31.7 % (ref 14.0–48.0)
MCH: 24.2 pg — ABNORMAL LOW (ref 26.0–34.0)
MCHC: 31.7 g/dL — ABNORMAL LOW (ref 32.0–36.0)
MCV: 77 fL — ABNORMAL LOW (ref 81–101)
MONO#: 0.7 10*3/uL (ref 0.1–0.9)
MONO%: 8.3 % (ref 0.0–13.0)
NEUT%: 58.1 % (ref 39.6–80.0)
NEUTROS ABS: 5 10*3/uL (ref 1.5–6.5)
PLATELETS: 370 10*3/uL (ref 145–400)
RBC: 4.99 10*6/uL (ref 3.70–5.32)
RDW: 17.1 % — ABNORMAL HIGH (ref 11.1–15.7)
WBC: 8.6 10*3/uL (ref 3.9–10.0)

## 2016-08-31 LAB — COMPREHENSIVE METABOLIC PANEL (CC13)
A/G RATIO: 1.4 (ref 1.2–2.2)
ALBUMIN: 4.4 g/dL (ref 3.6–4.8)
ALK PHOS: 74 IU/L (ref 39–117)
ALT: 14 IU/L (ref 0–32)
AST (SGOT): 15 IU/L (ref 0–40)
BUN / CREAT RATIO: 21 (ref 12–28)
BUN: 16 mg/dL (ref 8–27)
Bilirubin Total: 0.3 mg/dL (ref 0.0–1.2)
CALCIUM: 9.5 mg/dL (ref 8.7–10.3)
CO2: 29 mmol/L (ref 18–29)
CREATININE: 0.77 mg/dL (ref 0.57–1.00)
Chloride, Ser: 100 mmol/L (ref 96–106)
GFR calc Af Amer: 92 mL/min/{1.73_m2} (ref >59)
GFR, EST NON AFRICAN AMERICAN: 80 mL/min/{1.73_m2} (ref >59)
GLOBULIN, TOTAL: 3.1 g/dL (ref 1.5–4.5)
Glucose: 126 mg/dL — ABNORMAL HIGH (ref 65–99)
Potassium, Ser: 4.5 mmol/L (ref 3.5–5.2)
SODIUM: 136 mmol/L (ref 134–144)
Total Protein: 7.5 g/dL (ref 6.0–8.5)

## 2016-08-31 LAB — LACTATE DEHYDROGENASE: LDH: 171 U/L (ref 125–245)

## 2016-08-31 LAB — CHCC SATELLITE - SMEAR

## 2016-08-31 NOTE — Progress Notes (Signed)
Hematology/Oncology Consultation   Name: Priscilla [name]      MRN: [id number]    Location: Room/bed info not found  Date: 08/31/2016 Time:3:47 PM   REFERRING PHYSICIAN: Howard Pouch, DO  REASON FOR CONSULT: Iron deficiency anemia    DIAGNOSIS: Iron deficiency anemia secondary to malabsorption   HISTORY OF PRESENT ILLNESS: Priscilla [name] is a very pleasant 66 yo caucasian female with history of having had the lap band procedure 10 years ago. This has resulted in iron deficiency anemia secondary to malabsorption. She had no improvement on an oral iron supplement. Iron saturation is 4% with a ferritin of 5.2. Hgb is 12.1 with an MCV of 77.  She is symptomatic with fatigue, weakness, dizziness with one syncopal episode 2 months ago. She has also had SOB with exertion and is chewing a lot of ice.  We also see her daughter here for iron deficiency anemia as well.  No other family history of anemia. No personal or familial history of cancer that she is aware of.  No fever, chills n/v, cough, rash, chest pain, palpitations, abdominal pain or changes in bowel or bladder habits.  No swelling or tenderness in his extremities. 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  She has type 2 diabetes and is on Levemir and Metformin. She states that her blood sugars are controlled. She had a total hysterectomy years ago due to uterine fibroids and ovarian cysts.  She has had no episodes of bleeding, bruising or petechiae. No lymphadenopathy found on exam. Her colonoscopy was in 2010 and was negative. Hemoccult test was negative in April.  She also states that she is up to date on her mammogram for the year and that it was negative.  She enjoys staying active and walks her dog for exercise.  She is a retired Geophysicist/field seismologist. She has 2 adult children and 5 grandchildren. No history of miscarriage.  She does not smoke or drink alcoholic beverages.   ROS: All other 10 point review of systems is negative.   PAST  MEDICAL HISTORY:   Past Medical History:  Diagnosis Date  . Anemia 12/14/2008  . Anxiety and depression 12/14/2008  . Asthma 11/12/2006  . CAD (coronary artery disease) 12/14/2008   hx transluminal coronary angioplasty  . Cataract   . Depression   . Diabetes mellitus with neuropathy ([place]) 12/14/2008  . Fibromyalgia 01/04/2007   sees rheuamtology  . Hyperlipemia 01/21/2010  . Hypertension 12/14/2008  . Leg edema 12/14/2008  . OSA (obstructive sleep apnea) 08/06/2008  . Osteoarthritis 12/14/2008  . Palpitations 11/12/2006   hx sinus tachy  . Psoriatic arthritis [facility])    sees rheumatology  . Rotator cuff injury     ALLERGIES: No Known Allergies    MEDICATIONS:  Current Outpatient Prescriptions on File Prior to Visit  Medication Sig Dispense Refill  . albuterol (PROVENTIL HFA;VENTOLIN HFA) 108 (90 Base) MCG/ACT inhaler Inhale 2 puffs into the lungs every 6 (six) hours as needed for wheezing or shortness of breath. 1 Inhaler 0  . aspirin 81 MG tablet Take 81 mg by mouth daily.      . Biotin 5000 MCG TABS Take 5,000 mcg by mouth daily.     . calcium-vitamin D 250-100 MG-UNIT per tablet Take 1 tablet by mouth 2 (two) times daily.    . cyclobenzaprine (FLEXERIL) 5 MG tablet Take 1 tablet (5 mg total) by mouth 2 (two) times daily as needed for muscle spasms. 20 tablet 0  . DULoxetine (CYMBALTA) 60  MG capsule TAKE 1 CAPSULE BY MOUTH EVERY DAY 30 capsule 5  . FREESTYLE LITE test strip USE TO TEST BLOOD SUGAR TWICE A DAY 350 each 4  . glucagon (GLUCAGON EMERGENCY) 1 MG injection Inject 1 mg into the muscle once as needed. (Patient not taking: Reported on 08/17/2016) 1 each 12  . insulin aspart (NOVOLOG FLEXPEN) 100 UNIT/ML FlexPen Inject 6-8 Units into the skin 3 (three) times daily with meals. Plus sliding scale. 15 mL 2  . Insulin Detemir (LEVEMIR FLEXTOUCH) 100 UNIT/ML Pen Inject 35 Units into the skin at bedtime. 15 mL 2  . Insulin Pen Needle (NOVOFINE) 32G X 6 MM MISC Use with levemir pen as instructed  100 each 3  . Insulin Syringe-Needle U-100 (B-D INS SYR MICROFINE 1CC/27G) 27G X 5/8" 1 ML MISC Use 4 times daily prior to meals and at bedtime 120 each 6  . levalbuterol (XOPENEX HFA) 45 MCG/ACT inhaler Inhale 1-2 puffs into the lungs every 6 (six) hours as needed for wheezing. (Patient not taking: Reported on 08/17/2016) 1 Inhaler 2  . metFORMIN (GLUCOPHAGE) 1000 MG tablet TAKE 1 TABLET BY MOUTH 2 TIMES DAILY WITH A MEAL 60 tablet 2  . metoprolol tartrate (LOPRESSOR) 25 MG tablet TAKE 1 TABLET BY MOUTH 2 TIMES DAILY 180 tablet 0  . Multiple Vitamin (MULTIVITAMIN) tablet Take 1 tablet by mouth daily.    . naproxen (NAPROSYN) 375 MG tablet Take 1 tablet (375 mg total) by mouth 2 (two) times daily as needed for moderate pain. 20 tablet 0  . zolpidem (AMBIEN) 10 MG tablet TAKE 1 TABLET BY MOUTH AT BEDTIME AS NEEDED (Patient taking differently: TAKE 5 MG BY MOUTH AT BEDTIME AS NEEDED FOR SLEEP) 30 tablet 5   No current facility-administered medications on file prior to visit.      PAST SURGICAL HISTORY Past Surgical History:  Procedure Laterality Date  . ABDOMINAL HYSTERECTOMY    . CHOLECYSTECTOMY    . LAPAROSCOPIC GASTRIC BANDING    . PTCA    . WRIST SURGERY     LEFT    FAMILY HISTORY: Family History  Problem Relation Age of Onset  . Other Mother        ALS  . Arthritis Mother   . Stroke Father   . Alcohol abuse Father   . Diabetes Father   . Heart disease Father   . Early death Father 26  . Diabetes Sister   . Arthritis Sister   . COPD Brother   . Arthritis Maternal Aunt   . Diabetes Maternal Aunt   . Early death Maternal Aunt 49  . Diabetes Maternal Uncle   . Cancer Paternal Aunt   . COPD Paternal 19   . Heart disease Paternal Aunt   . Heart disease Paternal Uncle   . Alcohol abuse Paternal Grandfather   . Heart disease Paternal Grandfather   . Early death Paternal Grandfather 47  . Stroke Paternal Grandfather     SOCIAL HISTORY:  reports that she has never  smoked. She has never used smokeless tobacco. She reports that she drinks alcohol. She reports that she does not use drugs.  PERFORMANCE STATUS: The patient's performance status is 1 - Symptomatic but completely ambulatory  PHYSICAL EXAM: Most Recent Vital Signs: Blood pressure 123/70, pulse 75, temperature 98.7 F (37.1 C), temperature source Oral, resp. rate 16, weight 167 lb 6.4 oz (75.9 kg), SpO2 99 %. BP 123/70 (BP Location: Left Arm, Patient Position: Sitting)   Pulse 75  Temp 98.7 F (37.1 C) (Oral)   Resp 16   Wt 167 lb 6.4 oz (75.9 kg)   SpO2 99%   BMI 28.73 kg/m   General Appearance:    Alert, cooperative, no distress, appears stated age  Head:    Normocephalic, without obvious abnormality, atraumatic  Eyes:    PERRL, conjunctiva/corneas clear, EOM's intact, fundi    benign, both eyes        Throat:   Lips, mucosa, and tongue normal; teeth and gums normal  Neck:   Supple, symmetrical, trachea midline, no adenopathy;    thyroid:  no enlargement/tenderness/nodules; no carotid   bruit or JVD  Back:     Symmetric, no curvature, ROM normal, no CVA tenderness  Lungs:     Clear to auscultation bilaterally, respirations unlabored  Chest Wall:    No tenderness or deformity   Heart:    Regular rate and rhythm, S1 and S2 normal, no murmur, rub   or gallop     Abdomen:     Soft, non-tender, bowel sounds active all four quadrants,    no masses, no organomegaly        Extremities:   Extremities normal, atraumatic, no cyanosis or edema  Pulses:   2+ and symmetric all extremities  Skin:   Skin color, texture, turgor normal, no rashes or lesions  Lymph nodes:   Cervical, supraclavicular, and axillary nodes normal  Neurologic:   CNII-XII intact, normal strength, sensation and reflexes    throughout    LABORATORY DATA:  Results for orders placed or performed in visit on 08/31/16 (from the past 48 hour(s))  CBC w/Diff     Status: Abnormal   Collection Time: 08/31/16  1:06 PM   Result Value Ref Range   WBC 8.6 3.9 - 10.0 10e3/uL   RBC 4.99 3.70 - 5.32 10e6/uL   HGB 12.1 11.6 - 15.9 g/dL   HCT 38.2 34.8 - 46.6 %   MCV 77 (L) 81 - 101 fL   MCH 24.2 (L) 26.0 - 34.0 pg   MCHC 31.7 (L) 32.0 - 36.0 g/dL   RDW 17.1 (H) 11.1 - 15.7 %   Platelets 370 145 - 400 10e3/uL   NEUT# 5.0 1.5 - 6.5 10e3/uL   LYMPH# 2.7 0.9 - 3.3 10e3/uL   MONO# 0.7 0.1 - 0.9 10e3/uL   Eosinophils Absolute 0.1 0.0 - 0.5 10e3/uL   BASO# 0.1 0.0 - 0.2 10e3/uL   NEUT% 58.1 39.6 - 80.0 %   LYMPH% 31.7 14.0 - 48.0 %   MONO% 8.3 0.0 - 13.0 %   EOS% 1.3 0.0 - 7.0 %   BASO% 0.6 0.0 - 2.0 %  Smear     Status: None   Collection Time: 08/31/16  1:06 PM  Result Value Ref Range   Smear Result Smear Available       RADIOGRAPHY: No results found.     PATHOLOGY: None  ASSESSMENT/PLAN: Priscilla [name] is a very pleasant 66 yo caucasian female with iron deficiency anemia secondary to malabsorption (lap band). She had no improvement on an oral iron supplement. Iron saturation is 4% with a ferritin of 5.2. Hgb is 12.1 with an MCV of 77. She is symptomatic with fatigue, weakness, dizziness, SOB with exertion and chewing ice.  We will give her IV iron tomorrow and again in 8 days.  We will see what her Vit D, B 12 and erythropoietin level are.  We will then plan to see her back in 6  weeks for repeat lab work and follow-up.   All questions were answered. She will contact our office with any questions or concerns. We can certainly see her sooner if necessary.  She was discussed with and also seen by Dr. Marin Olp and he is in agreement with the aforementioned.   [facility]     Addendum:  I saw and examined the patient with Priscilla [name]. I agree with the above assessment. I looked at her blood under the microscope. She had hypochromic and microcytic red blood cells. There was some anisocytosis. I do not see any nucleated red blood cells. Platelets were adequate in number area and white cells did not appear  to be immature. There were no hypersegmented polys.  I suspect that she has a iron deficiency because of her underlying gastric bypass.  From the iron studies done a month ago, she clearly has iron deficiency.  I just wonder if she may not also have some element of vitamin B-12 deficiency. She had her gastric bypass 10 years ago, she would be at risk for pernicious anemia.  She had her colonoscopy 5 years ago so I don't think this will be an issue.  We will await the studies and get her set up with IV iron. I know that this will help her out.  We spent about 40 minutes with her. We answered all of her questions. She is very nice.  Lattie Haw, MD

## 2016-09-01 ENCOUNTER — Other Ambulatory Visit: Payer: Medicare Other

## 2016-09-01 ENCOUNTER — Ambulatory Visit: Payer: Medicare Other

## 2016-09-01 ENCOUNTER — Other Ambulatory Visit: Payer: Self-pay | Admitting: Lab

## 2016-09-01 ENCOUNTER — Ambulatory Visit: Payer: Medicare Other | Admitting: Family

## 2016-09-01 ENCOUNTER — Ambulatory Visit (HOSPITAL_BASED_OUTPATIENT_CLINIC_OR_DEPARTMENT_OTHER): Payer: Medicare Other

## 2016-09-01 VITALS — BP 103/64 | HR 72 | Temp 98.7°F | Resp 18

## 2016-09-01 DIAGNOSIS — E559 Vitamin D deficiency, unspecified: Secondary | ICD-10-CM | POA: Diagnosis not present

## 2016-09-01 DIAGNOSIS — D510 Vitamin B12 deficiency anemia due to intrinsic factor deficiency: Secondary | ICD-10-CM

## 2016-09-01 DIAGNOSIS — D508 Other iron deficiency anemias: Secondary | ICD-10-CM | POA: Diagnosis not present

## 2016-09-01 DIAGNOSIS — D509 Iron deficiency anemia, unspecified: Secondary | ICD-10-CM

## 2016-09-01 DIAGNOSIS — K909 Intestinal malabsorption, unspecified: Secondary | ICD-10-CM

## 2016-09-01 DIAGNOSIS — Z9884 Bariatric surgery status: Secondary | ICD-10-CM

## 2016-09-01 LAB — RETICULOCYTES: RETICULOCYTE COUNT: 0.7 % (ref 0.6–2.6)

## 2016-09-01 LAB — IRON AND TIBC
%SAT: 8 % — ABNORMAL LOW (ref 21–57)
Iron: 38 ug/dL — ABNORMAL LOW (ref 41–142)
TIBC: 470 ug/dL — AB (ref 236–444)
UIBC: 432 ug/dL — AB (ref 120–384)

## 2016-09-01 LAB — ERYTHROPOIETIN: ERYTHROPOIETIN: 12 m[IU]/mL (ref 2.6–18.5)

## 2016-09-01 LAB — FERRITIN: Ferritin: 7 ng/ml — ABNORMAL LOW (ref 9–269)

## 2016-09-01 MED ORDER — SODIUM CHLORIDE 0.9 % IV SOLN
Freq: Once | INTRAVENOUS | Status: AC
  Administered 2016-09-01: 16:00:00 via INTRAVENOUS

## 2016-09-01 MED ORDER — SODIUM CHLORIDE 0.9 % IV SOLN
510.0000 mg | Freq: Once | INTRAVENOUS | Status: AC
  Administered 2016-09-01: 510 mg via INTRAVENOUS
  Filled 2016-09-01: qty 17

## 2016-09-01 NOTE — Patient Instructions (Signed)

## 2016-09-02 LAB — VITAMIN D 25 HYDROXY (VIT D DEFICIENCY, FRACTURES): Vitamin D, 25-Hydroxy: 26.7 ng/mL — ABNORMAL LOW (ref 30.0–100.0)

## 2016-09-02 LAB — VITAMIN B12: Vitamin B12: 335 pg/mL (ref 232–1245)

## 2016-09-05 ENCOUNTER — Other Ambulatory Visit: Payer: Self-pay | Admitting: Family

## 2016-09-06 DIAGNOSIS — H5203 Hypermetropia, bilateral: Secondary | ICD-10-CM | POA: Diagnosis not present

## 2016-09-06 DIAGNOSIS — H524 Presbyopia: Secondary | ICD-10-CM | POA: Diagnosis not present

## 2016-09-06 DIAGNOSIS — E119 Type 2 diabetes mellitus without complications: Secondary | ICD-10-CM | POA: Diagnosis not present

## 2016-09-06 DIAGNOSIS — H2513 Age-related nuclear cataract, bilateral: Secondary | ICD-10-CM | POA: Diagnosis not present

## 2016-09-06 DIAGNOSIS — H40022 Open angle with borderline findings, high risk, left eye: Secondary | ICD-10-CM | POA: Diagnosis not present

## 2016-09-06 LAB — HM DIABETES EYE EXAM

## 2016-09-07 ENCOUNTER — Encounter: Payer: Self-pay | Admitting: *Deleted

## 2016-09-08 ENCOUNTER — Other Ambulatory Visit: Payer: Self-pay | Admitting: Family

## 2016-09-08 ENCOUNTER — Ambulatory Visit (HOSPITAL_BASED_OUTPATIENT_CLINIC_OR_DEPARTMENT_OTHER): Payer: Medicare Other

## 2016-09-08 ENCOUNTER — Telehealth: Payer: Self-pay | Admitting: *Deleted

## 2016-09-08 VITALS — BP 145/72 | HR 68 | Temp 98.2°F | Resp 16

## 2016-09-08 DIAGNOSIS — K909 Intestinal malabsorption, unspecified: Secondary | ICD-10-CM | POA: Diagnosis not present

## 2016-09-08 DIAGNOSIS — D509 Iron deficiency anemia, unspecified: Secondary | ICD-10-CM

## 2016-09-08 DIAGNOSIS — E559 Vitamin D deficiency, unspecified: Secondary | ICD-10-CM

## 2016-09-08 DIAGNOSIS — Z9884 Bariatric surgery status: Secondary | ICD-10-CM

## 2016-09-08 DIAGNOSIS — D508 Other iron deficiency anemias: Secondary | ICD-10-CM | POA: Diagnosis present

## 2016-09-08 MED ORDER — VITAMIN D (ERGOCALCIFEROL) 1.25 MG (50000 UNIT) PO CAPS: 50000.0000 [IU] | ORAL_CAPSULE | ORAL | 3 refills | Status: DC

## 2016-09-08 MED ORDER — SODIUM CHLORIDE 0.9 % IV SOLN
Freq: Once | INTRAVENOUS | Status: AC
  Administered 2016-09-08: 14:00:00 via INTRAVENOUS

## 2016-09-08 MED ORDER — SODIUM CHLORIDE 0.9 % IV SOLN
510.0000 mg | Freq: Once | INTRAVENOUS | Status: AC
  Administered 2016-09-08: 510 mg via INTRAVENOUS
  Filled 2016-09-08: qty 17

## 2016-09-08 NOTE — Patient Instructions (Signed)

## 2016-09-08 NOTE — Telephone Encounter (Addendum)
Patient is aware of results and new prescription  ----- Message from Eliezer Bottom, NP sent at 09/08/2016 10:20 AM EDT ----- Regarding: Vit D Vit D is low. Prescription sent to [facility] for Vit D 50,000 once a week. Thank you!  Sarah  ----- Message ----- From: Interface, Lab In Three Zero One Sent: 09/02/2016   4:39 AM To: Eliezer Bottom, NP

## 2016-09-13 ENCOUNTER — Other Ambulatory Visit: Payer: Self-pay | Admitting: Family Medicine

## 2016-09-14 ENCOUNTER — Other Ambulatory Visit: Payer: Self-pay | Admitting: Family Medicine

## 2016-09-15 NOTE — Telephone Encounter (Signed)
Patient notified and verbalized understanding. She will call back when she has her calendar to schedule appointment.

## 2016-09-15 NOTE — Telephone Encounter (Signed)
Pts need to be seen every 6 months for chronic medical conditions. It appears she is due for her appt this month. I do not see she has one scheduled.  A 30 day supply will be called in for her, and she should schedule an appt this month.  Please make her aware. Thanks.

## 2016-10-03 ENCOUNTER — Other Ambulatory Visit: Payer: Self-pay | Admitting: Internal Medicine

## 2016-10-12 ENCOUNTER — Other Ambulatory Visit: Payer: Self-pay | Admitting: Family Medicine

## 2016-10-12 DIAGNOSIS — Z1231 Encounter for screening mammogram for malignant neoplasm of breast: Secondary | ICD-10-CM

## 2016-10-13 ENCOUNTER — Other Ambulatory Visit (HOSPITAL_BASED_OUTPATIENT_CLINIC_OR_DEPARTMENT_OTHER): Payer: Medicare Other

## 2016-10-13 ENCOUNTER — Ambulatory Visit (HOSPITAL_BASED_OUTPATIENT_CLINIC_OR_DEPARTMENT_OTHER): Payer: Medicare Other | Admitting: Family

## 2016-10-13 VITALS — BP 136/76 | HR 71 | Temp 98.4°F | Resp 16 | Wt 173.0 lb

## 2016-10-13 DIAGNOSIS — D508 Other iron deficiency anemias: Secondary | ICD-10-CM

## 2016-10-13 DIAGNOSIS — Z9884 Bariatric surgery status: Secondary | ICD-10-CM | POA: Diagnosis not present

## 2016-10-13 DIAGNOSIS — K909 Intestinal malabsorption, unspecified: Secondary | ICD-10-CM

## 2016-10-13 DIAGNOSIS — E559 Vitamin D deficiency, unspecified: Secondary | ICD-10-CM

## 2016-10-13 LAB — COMPREHENSIVE METABOLIC PANEL (CC13)
A/G RATIO: 1.5 (ref 1.2–2.2)
ALBUMIN: 4.1 g/dL (ref 3.6–4.8)
ALT: 20 IU/L (ref 0–32)
AST (SGOT): 14 IU/L (ref 0–40)
Alkaline Phosphatase, S: 72 IU/L (ref 39–117)
BILIRUBIN TOTAL: 0.2 mg/dL (ref 0.0–1.2)
BUN / CREAT RATIO: 18 (ref 12–28)
BUN: 16 mg/dL (ref 8–27)
CHLORIDE: 103 mmol/L (ref 96–106)
Calcium, Ser: 9.5 mg/dL (ref 8.7–10.3)
Carbon Dioxide, Total: 28 mmol/L (ref 20–29)
Creatinine, Ser: 0.9 mg/dL (ref 0.57–1.00)
GFR calc non Af Amer: 66 mL/min/{1.73_m2} (ref >59)
GFR, EST AFRICAN AMERICAN: 77 mL/min/{1.73_m2} (ref >59)
GLUCOSE: 110 mg/dL — AB (ref 65–99)
Globulin, Total: 2.7 g/dL (ref 1.5–4.5)
POTASSIUM: 4.9 mmol/L (ref 3.5–5.2)
SODIUM: 141 mmol/L (ref 134–144)
TOTAL PROTEIN: 6.8 g/dL (ref 6.0–8.5)

## 2016-10-13 LAB — CBC WITH DIFFERENTIAL (CANCER CENTER ONLY)
BASO#: 0 10*3/uL (ref 0.0–0.2)
BASO%: 0.6 % (ref 0.0–2.0)
EOS%: 3.5 % (ref 0.0–7.0)
Eosinophils Absolute: 0.2 10*3/uL (ref 0.0–0.5)
HEMATOCRIT: 40.1 % (ref 34.8–46.6)
HGB: 13 g/dL (ref 11.6–15.9)
LYMPH#: 2.6 10*3/uL (ref 0.9–3.3)
LYMPH%: 37.8 % (ref 14.0–48.0)
MCH: 27.3 pg (ref 26.0–34.0)
MCHC: 32.4 g/dL (ref 32.0–36.0)
MCV: 84 fL (ref 81–101)
MONO#: 0.5 10*3/uL (ref 0.1–0.9)
MONO%: 7.3 % (ref 0.0–13.0)
NEUT#: 3.5 10*3/uL (ref 1.5–6.5)
NEUT%: 50.8 % (ref 39.6–80.0)
Platelets: 293 10*3/uL (ref 145–400)
RBC: 4.76 10*6/uL (ref 3.70–5.32)
RDW: 20.9 % — AB (ref 11.1–15.7)
WBC: 6.8 10*3/uL (ref 3.9–10.0)

## 2016-10-13 NOTE — Progress Notes (Signed)
Hematology and Oncology Follow Up Visit  [name] [id number] 04-06-1951 66 y.o. 10/13/2016   Principle Diagnosis:  Iron deficiency anemia secondary to malabsorption since having lap band surgery   Current Therapy:   IV iron as indicated    Interim History:  Ms. [name] is here today for follow-up. She received 2 doses of IV iron in May/June. Ferritin at that time was 7 and iron saturation was 8%. Iron studies today are pending.  She is still having some fatigue and occasional SOB with over exertion. She states that her husband had a knee replacement a couple weeks ago and she is busy caring for him.  No c/o fever, chills, n/v, cough, rash, chest pain, palpitations, abdominal pain or changes in bowel or bladder habits.  She has occasional episodes of dizziness when she gets up too fast or turns her head a certain way. She plans to follow-up with her PCP to discuss possible vertigo.  No swelling or tenderness in her extremities. The neuropathy in her feet is unchanged.  She states that her blood sugars are fairly well controlled. She sees her endocrinologist again in August.  She has maintained a good appetite and is staying well hydrated. Her weight is stable.   ECOG Performance Status: 1 - Symptomatic but completely ambulatory  Medications:  Allergies as of 10/13/2016   No Known Allergies     Medication List       Accurate as of 10/13/16  3:23 PM. Always use your most recent med list.          albuterol 108 (90 Base) MCG/ACT inhaler Commonly known as:  PROVENTIL HFA;VENTOLIN HFA Inhale 2 puffs into the lungs every 6 (six) hours as needed for wheezing or shortness of breath.   aspirin 81 MG tablet Take 81 mg by mouth daily.   Biotin 5000 MCG Tabs Take 5,000 mcg by mouth daily.   calcium-vitamin D 250-100 MG-UNIT tablet Take 1 tablet by mouth 2 (two) times daily.   cyclobenzaprine 5 MG tablet Commonly known as:  FLEXERIL Take 1 tablet (5 mg total) by mouth 2  (two) times daily as needed for muscle spasms.   DULoxetine 60 MG capsule Commonly known as:  CYMBALTA TAKE 1 CAPSULE BY MOUTH EVERY DAY   FREESTYLE LITE test strip Generic drug:  glucose blood USE TO TEST BLOOD SUGAR TWICE A DAY   glucagon 1 MG injection Commonly known as:  GLUCAGON EMERGENCY Inject 1 mg into the muscle once as needed.   insulin aspart 100 UNIT/ML FlexPen Commonly known as:  NOVOLOG FLEXPEN Inject 6-8 Units into the skin 3 (three) times daily with meals. Plus sliding scale.   Insulin Detemir 100 UNIT/ML Pen Commonly known as:  LEVEMIR FLEXTOUCH Inject 35 Units into the skin at bedtime.   Insulin Pen Needle 32G X 6 MM Misc Commonly known as:  NOVOFINE Use with levemir pen as instructed   Insulin Syringe-Needle U-100 27G X 5/8" 1 ML Misc Commonly known as:  B-D INS SYR MICROFINE 1CC/27G Use 4 times daily prior to meals and at bedtime   levalbuterol 45 MCG/ACT inhaler Commonly known as:  XOPENEX HFA Inhale 1-2 puffs into the lungs every 6 (six) hours as needed for wheezing.   metFORMIN 1000 MG tablet Commonly known as:  GLUCOPHAGE TAKE 1 TABLET BY MOUTH 2 TIMES DAILY WITH A MEAL   metoprolol tartrate 25 MG tablet Commonly known as:  LOPRESSOR TAKE 1 TABLET BY MOUTH 2 TIMES DAILY   multivitamin tablet  Take 1 tablet by mouth daily.   naproxen 375 MG tablet Commonly known as:  NAPROSYN Take 1 tablet (375 mg total) by mouth 2 (two) times daily as needed for moderate pain.   Vitamin D (Ergocalciferol) 50000 units Caps capsule Commonly known as:  DRISDOL Take 1 capsule (50,000 Units total) by mouth every 7 (seven) days.   zolpidem 10 MG tablet Commonly known as:  AMBIEN TAKE 1 TABLET BY MOUTH AT BEDTIME AS NEEDED       Allergies: No Known Allergies  Past Medical History, Surgical history, Social history, and Family History were reviewed and updated.  Review of Systems: All other 10 point review of systems is negative.   Physical Exam:   weight is 173 lb (78.5 kg). Her oral temperature is 98.4 F (36.9 C). Her blood pressure is 136/76 and her pulse is 71. Her respiration is 16 and oxygen saturation is 99%.   Wt Readings from Last 3 Encounters:  10/13/16 173 lb (78.5 kg)  08/31/16 167 lb 6.4 oz (75.9 kg)  08/17/16 170 lb (77.1 kg)    Ocular: Sclerae unicteric, pupils equal, round and reactive to light Ear-nose-throat: Oropharynx clear, dentition fair Lymphatic: No cervical, supraclavicular or axillary adenopathy Lungs no rales or rhonchi, good excursion bilaterally Heart regular rate and rhythm, no murmur appreciated Abd soft, nontender, positive bowel sounds, no liver or spleen tip palpated on exam, no fluid wave MSK no focal spinal tenderness, no joint edema Neuro: non-focal, well-oriented, appropriate affect Breasts: Deferred   Lab Results  Component Value Date   WBC 6.8 10/13/2016   HGB 13.0 10/13/2016   HCT 40.1 10/13/2016   MCV 84 10/13/2016   PLT 293 10/13/2016   Lab Results  Component Value Date   FERRITIN 7 (L) 08/31/2016   IRON 38 (L) 08/31/2016   TIBC 470 (H) 08/31/2016   UIBC 432 (H) 08/31/2016   IRONPCTSAT 8 (L) 08/31/2016   Lab Results  Component Value Date   RBC 4.76 10/13/2016   No results found for: KPAFRELGTCHN, LAMBDASER, KAPLAMBRATIO No results found for: IGGSERUM, IGA, IGMSERUM No results found for: Ronnald Ramp, A1GS, A2GS, Tillman Sers, SPEI   Chemistry      Component Value Date/Time   NA 136 08/31/2016 1306   K 4.5 08/31/2016 1306   CL 100 08/31/2016 1306   CO2 29 08/31/2016 1306   BUN 16 08/31/2016 1306   CREATININE 0.77 08/31/2016 1306      Component Value Date/Time   CALCIUM 9.5 08/31/2016 1306   ALKPHOS 74 08/31/2016 1306   AST 15 08/31/2016 1306   ALT 14 08/31/2016 1306   BILITOT 0.3 08/31/2016 1306      Impression and Plan: 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 She is feeling better after receiving 2 doses of IV iron 6 week ago. She still has some fatigued and occasional SOB with over exertion.  We will see what her iron studies show and bring her back in next week for an infusion if needed.  She had low vit D at her last visit and started taking 50,000 units weekly and verbalized that has been taking as prescribed.  We will plan to see her back again in 3 months for repeat lab work and follow-up.  She will contact our office with any questions or concerns. We can certainly see her sooner if need be.    [place],SARAH  M, NP 7/6/20183:23 PM

## 2016-10-16 LAB — IRON AND TIBC
%SAT: 29 % (ref 21–57)
IRON: 75 ug/dL (ref 41–142)
TIBC: 261 ug/dL (ref 236–444)
UIBC: 186 ug/dL (ref 120–384)

## 2016-10-16 LAB — FERRITIN: FERRITIN: 138 ng/mL (ref 9–269)

## 2016-10-23 ENCOUNTER — Other Ambulatory Visit: Payer: Self-pay | Admitting: *Deleted

## 2016-10-23 MED ORDER — METOPROLOL TARTRATE 25 MG PO TABS: 25.0000 mg | ORAL_TABLET | Freq: Two times a day (BID) | ORAL | 0 refills | Status: DC

## 2016-10-23 NOTE — Telephone Encounter (Signed)
Scheduled patient to be seen for her HTN I sent the Rx to the pharmacy. 2 week supply of Metoprolol sent to patient pharmacy.

## 2016-10-24 ENCOUNTER — Encounter: Payer: Self-pay | Admitting: Family Medicine

## 2016-10-24 ENCOUNTER — Ambulatory Visit: Payer: Medicare Other

## 2016-10-24 ENCOUNTER — Ambulatory Visit (INDEPENDENT_AMBULATORY_CARE_PROVIDER_SITE_OTHER): Payer: Medicare Other | Admitting: Family Medicine

## 2016-10-24 VITALS — BP 133/84 | HR 61 | Temp 98.4°F | Resp 20 | Wt 171.2 lb

## 2016-10-24 DIAGNOSIS — E785 Hyperlipidemia, unspecified: Secondary | ICD-10-CM

## 2016-10-24 DIAGNOSIS — K909 Intestinal malabsorption, unspecified: Secondary | ICD-10-CM

## 2016-10-24 DIAGNOSIS — I10 Essential (primary) hypertension: Secondary | ICD-10-CM | POA: Diagnosis not present

## 2016-10-24 MED ORDER — METOPROLOL TARTRATE 25 MG PO TABS: 25.0000 mg | ORAL_TABLET | Freq: Two times a day (BID) | ORAL | 5 refills | Status: DC

## 2016-10-24 NOTE — Patient Instructions (Signed)
BP looks good. Continue the metoprolol twice a day. Monitor BP if > 140/90 or lower ext edema, then Would want to start back HCTZ.  Follow up 6 months.

## 2016-10-24 NOTE — Progress Notes (Signed)
Patient ID: SYLIVA [name], female   DOB: [birth year], 66 y.o.   MRN: [id number]      Patient ID: Priscilla [name], female  DOB: Sep 01, 1950, 66 y.o.   MRN: [id number]  Chief Complaint  Patient presents with  . Hypertension    Subjective:  Priscilla [name] is a 66 y.o. female  Hypertension: Pt reports compliance with metoprolol BID. Blood pressures ranges at home are not checked. Patient denies chest pain, shortness of breath or lower extremity edema. Pt takes a daily baby ASA. Pt is not prescribed statin (had been in the past-but did not desire to continue). BMP: 10/13/2016 GFR NL CBC: 10/13/2016 --> much improved, no normal. Receiving iron infusions.  Diet: low sodium.  Exercise: routinely.  RF: HTN, HLD, DM, FHx HD  Patient has been treated for hypertesnion for many years. She had been on the same dosage of lisinopril/HCTZ 20-12.5 and metoprolol 25 mg twice a day, since prior to her lap band procedure until early 2018 medication was decreased,  when she started to have hypotension/dizziness.   She had a LAP-BAND procedure approximately 10 years ago. She has lost approximately 19 pounds in the last 2 years. Patient is a diabetic. She has a history palpitations. She has undergone heart catheter stress Myoview which she states was normal.   Iron deficiency anemia: She has now had a x2 iron infusions through hematology. She reports she is feeling much better, increased energy, no more shortness of breath. Labs are greatly improved and within normal ranges. She has follow in October.    Past Medical History:  Diagnosis Date  . Anemia 12/14/2008  . Anxiety and depression 12/14/2008  . Asthma 11/12/2006  . CAD (coronary artery disease) 12/14/2008   hx transluminal coronary angioplasty  . Cataract   . Depression   . Diabetes mellitus with neuropathy ([place]) 12/14/2008  . Fibromyalgia 01/04/2007   sees rheuamtology  . 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 Hyperlipemia 01/21/2010  .  Hypertension 12/14/2008  . Leg edema 12/14/2008  . 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 OSA (obstructive sleep apnea) 08/06/2008  . Osteoarthritis 12/14/2008  . Palpitations 11/12/2006   hx sinus tachy  . Psoriatic arthritis [facility])    sees rheumatology  . Rotator cuff injury    No Known Allergies Past Surgical History:  Procedure Laterality Date  . ABDOMINAL HYSTERECTOMY    . CHOLECYSTECTOMY    . LAPAROSCOPIC GASTRIC BANDING    . PTCA    . WRIST SURGERY     LEFT   Family History  Problem Relation Age of Onset  . Other Mother        ALS  . Arthritis Mother   . Stroke Father   . Alcohol abuse Father   . Diabetes Father   . Heart disease Father   . Early death Father 12  . Diabetes Sister   . Arthritis Sister   . COPD Brother   . Arthritis Maternal Aunt   . Diabetes Maternal Aunt   . Early death Maternal Aunt 87  . Diabetes Maternal Uncle   . Cancer Paternal Aunt   . COPD Paternal 77   . Heart disease Paternal Aunt   . Heart disease Paternal Uncle   . Alcohol abuse Paternal Grandfather   . Heart disease Paternal Grandfather   . Early death Paternal Grandfather 42  . Stroke Paternal Grandfather    Social History   Social History  . Marital status: Married    Spouse  name: N/A  . Number of children: N/A  . Years of education: N/A   Occupational History  . Not on file.   Social History Main Topics  . Smoking status: Never Smoker  . Smokeless tobacco: Never Used  . Alcohol use Yes     Comment: glass of wine-specially occasion  . Drug use: No  . Sexual activity: No   Other Topics Concern  . Not on file   Social History Narrative   Married, Priscilla [name]. 2 children name Abe People and Anderson Malta.   Some college, retired Engineer, manufacturing.   Drinks caffeine.   Take a daily vitamin.   Wears her seatbelt, exercises 3 times a week.   Smoke detector in the home.   Firearms in the home.   Feels safe in her relationships.   ROS: Negative, with the exception of above  mentioned in HPI  Objective: BP 133/84 (BP Location: Right Arm, Patient Position: Sitting, Cuff Size: Normal)   Pulse 61   Temp 98.4 F (36.9 C)   Resp 20   Wt 171 lb 4 oz (77.7 kg)   SpO2 97%   BMI 29.39 kg/m  Gen: Afebrile. No acute distress.  HENT: AT. [place]. . MMM.  Eyes:Pupils Equal Round Reactive to light, Extraocular movements intact,  Conjunctiva without redness, discharge or icterus. CV: RRR no murmur, trace edema, +2/4 P posterior tibialis pulses Chest: CTAB, no wheeze or crackles Skin: no rashes, purpura or petechiae.  Neuro:  Normal gait. PERLA. EOMi. Alert. Oriented x3  Assessment/plan: Priscilla [name] is a 66 y.o. female present for  Essential hypertension/palpitations - stable. - low sodium. Routine exercise.  - refilled metoprolol 25 mg BID for 6 months.  - BMP/CBC/lipid UTD.  - con't baby ASA - monitor for elevated pressures > 140/90 and return to clinic if routinely above goal.  - f/u 6 months.   Malabsorption of iron - doing great since iron infusions.  - con't routine f/u with heme  Hyperlipidemia, unspecified hyperlipidemia type - Pt preference to discontinue statin. Her cholesterol was ok without on last check. Discussed again the CV benefits with her today, agreed to to see how cholesterol is next visit and if elevated would again recommend and she will be more open to it.   Return in about 6 months (around 04/26/2017) for HTN/HLD/ iron deficiency.   Electronically signed by: Howard Pouch, DO [place]

## 2016-10-26 ENCOUNTER — Telehealth: Payer: Self-pay

## 2016-10-26 NOTE — Telephone Encounter (Signed)
LM requesting call back regarding AWV appt. Patient has "hold" spot on 11/10/16 @ 4pm with [facility], please confirm.

## 2016-10-27 ENCOUNTER — Ambulatory Visit: Payer: Medicare Other

## 2016-10-27 ENCOUNTER — Encounter (HOSPITAL_COMMUNITY): Payer: Self-pay

## 2016-10-30 ENCOUNTER — Other Ambulatory Visit: Payer: Self-pay

## 2016-10-31 ENCOUNTER — Ambulatory Visit: Payer: Medicare Other

## 2016-11-01 ENCOUNTER — Other Ambulatory Visit: Payer: Self-pay | Admitting: Internal Medicine

## 2016-11-04 ENCOUNTER — Other Ambulatory Visit: Payer: Self-pay | Admitting: Rheumatology

## 2016-11-06 ENCOUNTER — Other Ambulatory Visit: Payer: Self-pay | Admitting: Rheumatology

## 2016-11-06 NOTE — Telephone Encounter (Signed)
ok 

## 2016-11-06 NOTE — Telephone Encounter (Signed)
Patient is requesting refill of of zolpidem. Please send to [facility] on [address].

## 2016-11-06 NOTE — Telephone Encounter (Signed)
Last Visit: 08/17/16 Next Visit: 01/18/17  Okay to refill Ambien?

## 2016-11-06 NOTE — Telephone Encounter (Signed)
Discuss if Pt wants to try Ambien 5mg  po qhs as she takes only prn. It will have less SEs.

## 2016-11-07 MED ORDER — ZOLPIDEM TARTRATE 5 MG PO TABS: 5.0000 mg | ORAL_TABLET | Freq: Every evening | ORAL | 2 refills | Status: DC | PRN

## 2016-11-07 NOTE — Telephone Encounter (Signed)
Patient is interested in taking the Ambien 5 mg. Will send that prescription to the pharmacy.

## 2016-11-07 NOTE — Telephone Encounter (Signed)
Spoke with patient and advised would be sending to pharmacy. Patient is interested in using the Ambien 5 mg.

## 2016-11-10 ENCOUNTER — Ambulatory Visit (INDEPENDENT_AMBULATORY_CARE_PROVIDER_SITE_OTHER): Payer: Medicare Other

## 2016-11-10 VITALS — BP 122/78 | HR 82 | Ht 64.0 in | Wt 171.8 lb

## 2016-11-10 DIAGNOSIS — E785 Hyperlipidemia, unspecified: Secondary | ICD-10-CM

## 2016-11-10 DIAGNOSIS — Z0000 Encounter for general adult medical examination without abnormal findings: Secondary | ICD-10-CM | POA: Diagnosis not present

## 2016-11-10 DIAGNOSIS — Z23 Encounter for immunization: Secondary | ICD-10-CM | POA: Diagnosis not present

## 2016-11-10 MED ORDER — ZOSTER VAC RECOMB ADJUVANTED 50 MCG/0.5ML IM SUSR: 0.5000 mL | Freq: Once | INTRAMUSCULAR | 1 refills | Status: DC

## 2016-11-10 MED ORDER — ZOSTER VAC RECOMB ADJUVANTED 50 MCG/0.5ML IM SUSR: 0.5000 mL | Freq: Once | INTRAMUSCULAR | 1 refills | Status: AC

## 2016-11-10 NOTE — Patient Instructions (Addendum)
Bring a copy of your advance directives to your next office visit.  Continue doing brain stimulating activities (puzzles, reading, adult coloring books, staying active) to keep memory sharp.   Shingrix vaccine at pharmacy  Fall Prevention in the Home Falls can cause injuries. They can happen to people of all ages. There are many things you can do to make your home safe and to help prevent falls. What can I do on the outside of my home?  Regularly fix the edges of walkways and driveways and fix any cracks.  Remove anything that might make you trip as you walk through a door, such as a raised step or threshold.  Trim any bushes or trees on the path to your home.  Use bright outdoor lighting.  Clear any walking paths of anything that might make someone trip, such as rocks or tools.  Regularly check to see if handrails are loose or broken. Make sure that both sides of any steps have handrails.  Any raised decks and porches should have guardrails on the edges.  Have any leaves, snow, or ice cleared regularly.  Use sand or salt on walking paths during winter.  Clean up any spills in your garage right away. This includes oil or grease spills. What can I do in the bathroom?  Use night lights.  Install grab bars by the toilet and in the tub and shower. Do not use towel bars as grab bars.  Use non-skid mats or decals in the tub or shower.  If you need to sit down in the shower, use a plastic, non-slip stool.  Keep the floor dry. Clean up any water that spills on the floor as soon as it happens.  Remove soap buildup in the tub or shower regularly.  Attach bath mats securely with double-sided non-slip rug tape.  Do not have throw rugs and other things on the floor that can make you trip. What can I do in the bedroom?  Use night lights.  Make sure that you have a light by your bed that is easy to reach.  Do not use any sheets or blankets that are too big for your bed. They  should not hang down onto the floor.  Have a firm chair that has side arms. You can use this for support while you get dressed.  Do not have throw rugs and other things on the floor that can make you trip. What can I do in the kitchen?  Clean up any spills right away.  Avoid walking on wet floors.  Keep items that you use a lot in easy-to-reach places.  If you need to reach something above you, use a strong step stool that has a grab bar.  Keep electrical cords out of the way.  Do not use floor polish or wax that makes floors slippery. If you must use wax, use non-skid floor wax.  Do not have throw rugs and other things on the floor that can make you trip. What can I do with my stairs?  Do not leave any items on the stairs.  Make sure that there are handrails on both sides of the stairs and use them. Fix handrails that are broken or loose. Make sure that handrails are as long as the stairways.  Check any carpeting to make sure that it is firmly attached to the stairs. Fix any carpet that is loose or worn.  Avoid having throw rugs at the top or bottom of the stairs. If you  do have throw rugs, attach them to the floor with carpet tape.  Make sure that you have a light switch at the top of the stairs and the bottom of the stairs. If you do not have them, ask someone to add them for you. What else can I do to help prevent falls?  Wear shoes that: ? Do not have high heels. ? Have rubber bottoms. ? Are comfortable and fit you well. ? Are closed at the toe. Do not wear sandals.  If you use a stepladder: ? Make sure that it is fully opened. Do not climb a closed stepladder. ? Make sure that both sides of the stepladder are locked into place. ? Ask someone to hold it for you, if possible.  Clearly mark and make sure that you can see: ? Any grab bars or handrails. ? First and last steps. ? Where the edge of each step is.  Use tools that help you move around (mobility aids) if  they are needed. These include: ? Canes. ? Walkers. ? Scooters. ? Crutches.  Turn on the lights when you go into a dark area. Replace any light bulbs as soon as they burn out.  Set up your furniture so you have a clear path. Avoid moving your furniture around.  If any of your floors are uneven, fix them.  If there are any pets around you, be aware of where they are.  Review your medicines with your doctor. Some medicines can make you feel dizzy. This can increase your chance of falling. Ask your doctor what other things that you can do to help prevent falls. This information is not intended to replace advice given to you by your health care provider. Make sure you discuss any questions you have with your health care provider. Document Released: 01/21/2009 Document Revised: 09/02/2015 Document Reviewed: 05/01/2014 Elsevier Interactive Patient Education  2018 [place] Maintenance, Female Adopting a healthy lifestyle and getting preventive care can go a long way to promote health and wellness. Talk with your health care provider about what schedule of regular examinations is right for you. This is a good chance for you to check in with your provider about disease prevention and staying healthy. In between checkups, there are plenty of things you can do on your own. Experts have done a lot of research about which lifestyle changes and preventive measures are most likely to keep you healthy. Ask your health care provider for more information. Weight and diet Eat a healthy diet  Be sure to include plenty of vegetables, fruits, low-fat dairy products, and lean protein.  Do not eat a lot of foods high in solid fats, added sugars, or salt.  Get regular exercise. This is one of the most important things you can do for your health. ? Most adults should exercise for at least 150 minutes each week. The exercise should increase your heart rate and make you sweat (moderate-intensity  exercise). ? Most adults should also do strengthening exercises at least twice a week. This is in addition to the moderate-intensity exercise.  Maintain a healthy weight  Body mass index (BMI) is a measurement that can be used to identify possible weight problems. It estimates body fat based on height and weight. Your health care provider can help determine your BMI and help you achieve or maintain a healthy weight.  For females 63 years of age and older: ? A BMI below 18.5 is considered underweight. ? A BMI of 18.5  to 24.9 is normal. ? A BMI of 25 to 29.9 is considered overweight. ? A BMI of 30 and above is considered obese.  Watch levels of cholesterol and blood lipids  You should start having your blood tested for lipids and cholesterol at 67 years of age, then have this test every 5 years.  You may need to have your cholesterol levels checked more often if: ? Your lipid or cholesterol levels are high. ? You are older than 67 years of age. ? You are at high risk for heart disease.  Cancer screening Lung Cancer  Lung cancer screening is recommended for adults 37-49 years old who are at high risk for lung cancer because of a history of smoking.  A yearly low-dose CT scan of the lungs is recommended for people who: ? Currently smoke. ? Have quit within the past 15 years. ? Have at least a 30-pack-year history of smoking. A pack year is smoking an average of one pack of cigarettes a day for 1 year.  Yearly screening should continue until it has been 15 years since you quit.  Yearly screening should stop if you develop a health problem that would prevent you from having lung cancer treatment.  Breast Cancer  Practice breast self-awareness. This means understanding how your breasts normally appear and feel.  It also means doing regular breast self-exams. Let your health care provider know about any changes, no matter how small.  If you are in your 20s or 30s, you should have a  clinical breast exam (CBE) by a health care provider every 1-3 years as part of a regular health exam.  If you are 77 or older, have a CBE every year. Also consider having a breast X-ray (mammogram) every year.  If you have a family history of breast cancer, talk to your health care provider about genetic screening.  If you are at high risk for breast cancer, talk to your health care provider about having an MRI and a mammogram every year.  Breast cancer gene (BRCA) assessment is recommended for women who have family members with BRCA-related cancers. BRCA-related cancers include: ? Breast. ? Ovarian. ? Tubal. ? Peritoneal cancers.  Results of the assessment will determine the need for genetic counseling and BRCA1 and BRCA2 testing.  Cervical Cancer Your health care provider may recommend that you be screened regularly for cancer of the pelvic organs (ovaries, uterus, and vagina). This screening involves a pelvic examination, including checking for microscopic changes to the surface of your cervix (Pap test). You may be encouraged to have this screening done every 3 years, beginning at age 63.  For women ages 34-65, health care providers may recommend pelvic exams and Pap testing every 3 years, or they may recommend the Pap and pelvic exam, combined with testing for human papilloma virus (HPV), every 5 years. Some types of HPV increase your risk of cervical cancer. Testing for HPV may also be done on women of any age with unclear Pap test results.  Other health care providers may not recommend any screening for nonpregnant women who are considered low risk for pelvic cancer and who do not have symptoms. Ask your health care provider if a screening pelvic exam is right for you.  If you have had past treatment for cervical cancer or a condition that could lead to cancer, you need Pap tests and screening for cancer for at least 20 years after your treatment. If Pap tests have been discontinued,  your risk  factors (such as having a new sexual partner) need to be reassessed to determine if screening should resume. Some women have medical problems that increase the chance of getting cervical cancer. In these cases, your health care provider may recommend more frequent screening and Pap tests.  Colorectal Cancer  This type of cancer can be detected and often prevented.  Routine colorectal cancer screening usually begins at 67 years of age and continues through 68 years of age.  Your health care provider may recommend screening at an earlier age if you have risk factors for colon cancer.  Your health care provider may also recommend using home test kits to check for hidden blood in the stool.  A small camera at the end of a tube can be used to examine your colon directly (sigmoidoscopy or colonoscopy). This is done to check for the earliest forms of colorectal cancer.  Routine screening usually begins at age 23.  Direct examination of the colon should be repeated every 5-10 years through 67 years of age. However, you may need to be screened more often if early forms of precancerous polyps or small growths are found.  Skin Cancer  Check your skin from head to toe regularly.  Tell your health care provider about any new moles or changes in moles, especially if there is a change in a mole's shape or color.  Also tell your health care provider if you have a mole that is larger than the size of a pencil eraser.  Always use sunscreen. Apply sunscreen liberally and repeatedly throughout the day.  Protect yourself by wearing long sleeves, pants, a wide-brimmed hat, and sunglasses whenever you are outside.  Heart disease, diabetes, and high blood pressure  High blood pressure causes heart disease and increases the risk of stroke. High blood pressure is more likely to develop in: ? People who have blood pressure in the high end of the normal range (130-139/85-89 mm Hg). ? People who are  overweight or obese. ? People who are African American.  If you are 62-39 years of age, have your blood pressure checked every 3-5 years. If you are 1 years of age or older, have your blood pressure checked every year. You should have your blood pressure measured twice-once when you are at a hospital or clinic, and once when you are not at a hospital or clinic. Record the average of the two measurements. To check your blood pressure when you are not at a hospital or clinic, you can use: ? An automated blood pressure machine at a pharmacy. ? A home blood pressure monitor.  If you are between 8 years and 54 years old, ask your health care provider if you should take aspirin to prevent strokes.  Have regular diabetes screenings. This involves taking a blood sample to check your fasting blood sugar level. ? If you are at a normal weight and have a low risk for diabetes, have this test once every three years after 67 years of age. ? If you are overweight and have a high risk for diabetes, consider being tested at a younger age or more often. Preventing infection Hepatitis B  If you have a higher risk for hepatitis B, you should be screened for this virus. You are considered at high risk for hepatitis B if: ? You were born in a country where hepatitis B is common. Ask your health care provider which countries are considered high risk. ? Your parents were born in a high-risk country, and  you have not been immunized against hepatitis B (hepatitis B vaccine). ? You have HIV or AIDS. ? You use needles to inject street drugs. ? You live with someone who has hepatitis B. ? You have had sex with someone who has hepatitis B. ? You get hemodialysis treatment. ? You take certain medicines for conditions, including cancer, organ transplantation, and autoimmune conditions.  Hepatitis C  Blood testing is recommended for: ? Everyone born from [birth year] through 1966. ? Anyone with known risk factors for  hepatitis C.  Sexually transmitted infections (STIs)  You should be screened for sexually transmitted infections (STIs) including gonorrhea and chlamydia if: ? You are sexually active and are younger than 67 years of age. ? You are older than 67 years of age and your health care provider tells you that you are at risk for this type of infection. ? Your sexual activity has changed since you were last screened and you are at an increased risk for chlamydia or gonorrhea. Ask your health care provider if you are at risk.  If you do not have HIV, but are at risk, it may be recommended that you take a prescription medicine daily to prevent HIV infection. This is called pre-exposure prophylaxis (PrEP). You are considered at risk if: ? You are sexually active and do not regularly use condoms or know the HIV status of your partner(s). ? You take drugs by injection. ? You are sexually active with a partner who has HIV.  Talk with your health care provider about whether you are at high risk of being infected with HIV. If you choose to begin PrEP, you should first be tested for HIV. You should then be tested every 3 months for as long as you are taking PrEP. Pregnancy  If you are premenopausal and you may become pregnant, ask your health care provider about preconception counseling.  If you may become pregnant, take 400 to 800 micrograms (mcg) of folic acid every day.  If you want to prevent pregnancy, talk to your health care provider about birth control (contraception). Osteoporosis and menopause  Osteoporosis is a disease in which the bones lose minerals and strength with aging. This can result in serious bone fractures. Your risk for osteoporosis can be identified using a bone density scan.  If you are 65 years of age or older, or if you are at risk for osteoporosis and fractures, ask your health care provider if you should be screened.  Ask your health care provider whether you should take a  calcium or vitamin D supplement to lower your risk for osteoporosis.  Menopause may have certain physical symptoms and risks.  Hormone replacement therapy may reduce some of these symptoms and risks. Talk to your health care provider about whether hormone replacement therapy is right for you. Follow these instructions at home:  Schedule regular health, dental, and eye exams.  Stay current with your immunizations.  Do not use any tobacco products including cigarettes, chewing tobacco, or electronic cigarettes.  If you are pregnant, do not drink alcohol.  If you are breastfeeding, limit how much and how often you drink alcohol.  Limit alcohol intake to no more than 1 drink per day for nonpregnant women. One drink equals 12 ounces of beer, 5 ounces of wine, or 1 ounces of hard liquor.  Do not use street drugs.  Do not share needles.  Ask your health care provider for help if you need support or information about quitting drugs.  Tell  your health care provider if you often feel depressed.  Tell your health care provider if you have ever been abused or do not feel safe at home. This information is not intended to replace advice given to you by your health care provider. Make sure you discuss any questions you have with your health care provider. Document Released: 10/10/2010 Document Revised: 09/02/2015 Document Reviewed: 12/29/2014 Elsevier Interactive Patient Education  [employer].

## 2016-11-10 NOTE — Progress Notes (Addendum)
Subjective:   Priscilla [name] is a 66 y.o. female who presents for Medicare Annual (Subsequent) preventive examination.  Review of Systems:  No ROS.  Medicare Wellness Visit. Additional risk factors are reflected in the social history.  Cardiac Risk Factors include: advanced age (>96men, >53 women);diabetes mellitus;dyslipidemia;hypertension;family history of premature cardiovascular disease   Sleep patterns: Sleeps 7-8 hours, feels rested. Up to void x 2.  Home Safety/Smoke Alarms: Feels safe in home. Smoke alarms in place.  Living environment; residence and Firearm Safety: Lives with husband and sister in 1 story home. Rail at door.  Seat Belt Safety/Bike Helmet: Wears seat belt.   Counseling:   Eye Exam-Last exam 08/2016. Yearly Dr. Earlie Raveling exam < 1 year. Dental Works. Dental resources provided.   Female:   Pap-N/A       Mammo-08/30/2015, negative. Will make appt.     Dexa scan-08/30/2015, normal.          CCS-Colonoscopy 10/26/2008, normal. Recall 10 years.      Objective:     Vitals: BP 122/78 (BP Location: Left Arm, Patient Position: Sitting, Cuff Size: Normal)   Pulse 82   Ht 5\' 4"  (1.626 m)   Wt 171 lb 12.8 oz (77.9 kg)   SpO2 97%   BMI 29.49 kg/m   Body mass index is 29.49 kg/m.   Tobacco History  Smoking Status  . Never Smoker  Smokeless Tobacco  . Never Used     Counseling given: Not Answered   Past Medical History:  Diagnosis Date  . Anemia 12/14/2008  . Anxiety and depression 12/14/2008  . Asthma 11/12/2006  . CAD (coronary artery disease) 12/14/2008   hx transluminal coronary angioplasty  . Cataract   . Depression   . Diabetes mellitus with neuropathy ([place]) 12/14/2008  . Fibromyalgia 01/04/2007   sees rheuamtology  . 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 Hyperlipemia 01/21/2010  . Hypertension 12/14/2008  . Leg edema 12/14/2008  . 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 OSA (obstructive sleep apnea) 08/06/2008  . Osteoarthritis  12/14/2008  . Palpitations 11/12/2006   hx sinus tachy  . Psoriatic arthritis [facility])    sees rheumatology  . Rotator cuff injury    Past Surgical History:  Procedure Laterality Date  . ABDOMINAL HYSTERECTOMY    . CHOLECYSTECTOMY    . LAPAROSCOPIC GASTRIC BANDING    . PTCA    . WRIST SURGERY     LEFT   Family History  Problem Relation Age of Onset  . Other Mother        ALS  . Arthritis Mother   . Stroke Father   . Alcohol abuse Father   . Diabetes Father   . Heart disease Father   . Early death Father 21  . Diabetes Sister   . Arthritis Sister   . COPD Brother   . Arthritis Maternal Aunt   . Diabetes Maternal Aunt   . Early death Maternal Aunt 64  . Diabetes Maternal Uncle   . Cancer Paternal Aunt   . COPD Paternal 37   . Heart disease Paternal Aunt   . Heart disease Paternal Uncle   . Alcohol abuse Paternal Grandfather   . Heart disease Paternal Grandfather   . Early death Paternal Grandfather 21  . Stroke Paternal Grandfather    History  Sexual Activity  . Sexual activity: No    Outpatient Encounter Prescriptions as of 11/10/2016  Medication Sig  . albuterol (PROVENTIL HFA;VENTOLIN HFA) 108 (90  Base) MCG/ACT inhaler Inhale 2 puffs into the lungs every 6 (six) hours as needed for wheezing or shortness of breath.  Marland Kitchen aspirin 81 MG tablet Take 81 mg by mouth daily.    . Biotin 5000 MCG TABS Take 5,000 mcg by mouth daily.   . calcium-vitamin D 250-100 MG-UNIT per tablet Take 1 tablet by mouth 2 (two) times daily.  . cyclobenzaprine (FLEXERIL) 5 MG tablet Take 1 tablet (5 mg total) by mouth 2 (two) times daily as needed for muscle spasms.  . DULoxetine (CYMBALTA) 60 MG capsule TAKE 1 CAPSULE BY MOUTH EVERY DAY  . FREESTYLE LITE test strip USE TO TEST BLOOD SUGAR TWICE A DAY  . Insulin Detemir (LEVEMIR FLEXTOUCH) 100 UNIT/ML Pen Inject 35 Units into the skin at bedtime.  . Insulin Pen Needle (NOVOFINE) 32G X 6 MM MISC Use with levemir pen as instructed  . Insulin  Syringe-Needle U-100 (B-D INS SYR MICROFINE 1CC/27G) 27G X 5/8" 1 ML MISC Use 4 times daily prior to meals and at bedtime  . levalbuterol (XOPENEX HFA) 45 MCG/ACT inhaler Inhale 1-2 puffs into the lungs every 6 (six) hours as needed for wheezing.  . metFORMIN (GLUCOPHAGE) 1000 MG tablet TAKE 1 TABLET BY MOUTH 2 TIMES DAILY WITH A MEAL  . metoprolol tartrate (LOPRESSOR) 25 MG tablet Take 1 tablet (25 mg total) by mouth 2 (two) times daily.  . Multiple Vitamin (MULTIVITAMIN) tablet Take 1 tablet by mouth daily.  Marland Kitchen NOVOLOG FLEXPEN 100 UNIT/ML FlexPen Inject 6-8 Units into the skin 3 times daily with meals. Plus sliding scale.  . Vitamin D, Ergocalciferol, (DRISDOL) 50000 units CAPS capsule Take 1 capsule (50,000 Units total) by mouth every 7 (seven) days.  Marland Kitchen zolpidem (AMBIEN) 5 MG tablet Take 1 tablet (5 mg total) by mouth at bedtime as needed for sleep.  Marland Kitchen glucagon (GLUCAGON EMERGENCY) 1 MG injection Inject 1 mg into the muscle once as needed. (Patient not taking: Reported on 11/10/2016)  . Zoster Vac Recomb Adjuvanted [facility]) injection Inject 0.5 mLs into the muscle once.  . [DISCONTINUED] zolpidem (AMBIEN) 10 MG tablet TAKE 1 TABLET BY MOUTH AT BEDTIME  . [DISCONTINUED] Zoster Vac Recomb Adjuvanted (SHINGRIX) injection Inject 0.5 mLs into the muscle once.   No facility-administered encounter medications on file as of 11/10/2016.     Activities of Daily Living In your present state of health, do you have any difficulty performing the following activities: 11/10/2016  Hearing? N  Vision? N  Difficulty concentrating or making decisions? N  Walking or climbing stairs? N  Dressing or bathing? N  Doing errands, shopping? N  Preparing Food and eating ? N  Using the Toilet? N  In the past six months, have you accidently leaked urine? N  Do you have problems with loss of bowel control? N  Managing your Medications? N  Managing your Finances? N  Housekeeping or managing your Housekeeping? N  Some  recent data might be hidden    Patient Care Team: Ma Hillock, DO as PCP - General (Family Medicine) Marlou Sa, Tonna Corner, MD as Consulting Physician (Orthopedic Surgery) Philemon Kingdom, MD as Consulting Physician (Internal Medicine) Mcarthur Rossetti, MD as Consulting Physician (Orthopedic Surgery) Bo Merino, MD as Consulting Physician (Rheumatology) Volanda Napoleon, MD as Consulting Physician (Oncology) Magnus Sinning, MD as Consulting Physician (Physical Medicine and Rehabilitation) Dermatology, [facility]    Assessment:    Physical assessment deferred to PCP.  Exercise Activities and Dietary recommendations Current Exercise Habits: The patient does  not participate in regular exercise at present [facility], cares for grandchildren and sister), Exercise limited by: orthopedic condition(s)   Diet (meal preparation, eat out, water intake, caffeinated beverages, dairy products, fruits and vegetables): Drinks water.   Breakfast: Oatmeal (low sugar), nuts and fruit, coffee Lunch: sandwich Dinner: protein and vegetables.      Goals      Patient Stated   . Patient states.  (pt-stated)          Improve nutrition, mental health (increasing social mtgs with friends) and increasing activity.       Fall Risk Fall Risk  11/10/2016 10/24/2016 08/06/2015  Falls in the past year? Yes No No  Number falls in past yr: 1 - -  Injury with Fall? No - -  Risk for fall due to : Impaired balance/gait - -  Follow up Falls prevention discussed - -   Depression Screen PHQ 2/9 Scores 11/10/2016 10/24/2016 08/06/2015  PHQ - 2 Score 4 0 0  PHQ- 9 Score 4 - -     Cognitive Function       Ad8 score reviewed for issues:  Issues making decisions: no  Less interest in hobbies / activities: no  Repeats questions, stories (family complaining): no  Trouble using ordinary gadgets (microwave, computer, phone): no  Forgets the month or year: no  Mismanaging finances:  no  Remembering appts: no  Daily problems with thinking and/or memory: no Ad8 score is=0     Immunization History  Administered Date(s) Administered  . Influenza Split 01/21/2013  . Influenza, High Dose Seasonal PF 02/04/2015, 02/09/2016  . Influenza,inj,Quad PF,36+ Mos 01/15/2014  . Pneumococcal Conjugate-13 02/04/2015  . Pneumococcal Polysaccharide-23 10/31/2013  . Tdap 02/07/2013   Shingrix Vaccine Rx sent to pharmacy.  Screening Tests Health Maintenance  Topic Date Due  . FOOT EXAM  12/10/2015  . HEMOGLOBIN A1C  10/20/2016  . INFLUENZA VACCINE  11/08/2016  . MAMMOGRAM  08/29/2017  . OPHTHALMOLOGY EXAM  09/06/2017  . COLONOSCOPY  10/27/2018  . PNA vac Low Risk Adult (2 of 2 - PPSV23) 11/01/2018  . TETANUS/TDAP  02/08/2023  . DEXA SCAN  Addressed  . Hepatitis C Screening  Completed   Diabetic Foot Exam - Simple   Simple Foot Form Diabetic Foot exam was performed with the following findings:  Yes 11/10/2016  4:45 AM  Visual Inspection No deformities, no ulcerations, no other skin breakdown bilaterally:  Yes Sensation Testing Intact to touch and monofilament testing bilaterally:  Yes Pulse Check Posterior Tibialis and Dorsalis pulse intact bilaterally:  Yes Comments        Plan:    Bring a copy of your advance directives to your next office visit.  Continue doing brain stimulating activities (puzzles, reading, adult coloring books, staying active) to keep memory sharp.   Shingrix vaccine at pharmacy  I have personally reviewed and noted the following in the patient's chart:   . Medical and social history . Use of alcohol, tobacco or illicit drugs  . Current medications and supplements . Functional ability and status . Nutritional status . Physical activity . Advanced directives . List of other physicians . Hospitalizations, surgeries, and ER visits in previous 12 months . Vitals . Screenings to include cognitive, depression, and falls . Referrals  and appointments  In addition, I have reviewed and discussed with patient certain preventive protocols, quality metrics, and best practice recommendations. A written personalized care plan for preventive services as well as general preventive health recommendations were provided to  patient.     Gerilyn Nestle, RN  11/10/2016  PCP Notes: -Shingrix ordered -FLP ordered -F/U with PCP 0823/18 for htn--pt has restarted Lisinopril 2.5 mg (half tab)  Medical screening examination/treatment/procedure(s) were performed by non-physician practitioner and as supervising physician I or my partner MD,  was immediately available for consultation/collaboration.  I agree with above assessment and plan.  Electronically Signed by: Howard Pouch, DO [facility] [place]

## 2016-11-11 ENCOUNTER — Other Ambulatory Visit: Payer: Self-pay | Admitting: Rheumatology

## 2016-11-14 NOTE — Progress Notes (Signed)
AWV reviewed and agree.  Signed:  Phil McGowen, MD           11/14/2016  

## 2016-11-20 ENCOUNTER — Other Ambulatory Visit: Payer: Self-pay | Admitting: Family Medicine

## 2016-11-20 DIAGNOSIS — I10 Essential (primary) hypertension: Secondary | ICD-10-CM

## 2016-11-20 MED ORDER — LISINOPRIL 2.5 MG PO TABS: 2.5000 mg | ORAL_TABLET | Freq: Every day | ORAL | 5 refills | Status: DC

## 2016-11-20 NOTE — Telephone Encounter (Signed)
Patient notified and verbalized understanding. 

## 2016-11-20 NOTE — Telephone Encounter (Signed)
Lisinopril refilled. Please make pt aware. It was on her prior med list.

## 2016-11-20 NOTE — Telephone Encounter (Signed)
New Message  Pt advised this medication is showing improvements and would like this to be sent to her pharmacy.  *STAT* If patient is at the pharmacy, call can be transferred to refill team.   1. Which medications need to be refilled? (please list name of each medication and dose if known)  lisinopril 2.5 mg tablets once daily  2. Which pharmacy/location (including street and city if local pharmacy) is medication to be sent to? [facility], [address] [place], [place]  3. Do they need a 30 day or 90 day supply?  30 day supply

## 2016-11-23 ENCOUNTER — Encounter: Payer: Self-pay | Admitting: Internal Medicine

## 2016-11-23 ENCOUNTER — Other Ambulatory Visit (INDEPENDENT_AMBULATORY_CARE_PROVIDER_SITE_OTHER): Payer: Medicare Other

## 2016-11-23 ENCOUNTER — Ambulatory Visit (INDEPENDENT_AMBULATORY_CARE_PROVIDER_SITE_OTHER): Payer: Medicare Other | Admitting: Internal Medicine

## 2016-11-23 VITALS — BP 124/72 | HR 77 | Ht 65.0 in | Wt 171.0 lb

## 2016-11-23 DIAGNOSIS — Z794 Long term (current) use of insulin: Secondary | ICD-10-CM

## 2016-11-23 DIAGNOSIS — E1165 Type 2 diabetes mellitus with hyperglycemia: Secondary | ICD-10-CM

## 2016-11-23 DIAGNOSIS — E11319 Type 2 diabetes mellitus with unspecified diabetic retinopathy without macular edema: Secondary | ICD-10-CM | POA: Diagnosis not present

## 2016-11-23 DIAGNOSIS — E785 Hyperlipidemia, unspecified: Secondary | ICD-10-CM

## 2016-11-23 MED ORDER — INSULIN DETEMIR 100 UNIT/ML ~~LOC~~ SOLN: 30.0000 [IU] | Freq: Every day | SUBCUTANEOUS | 3 refills | Status: DC

## 2016-11-23 MED ORDER — "INSULIN SYRINGE-NEEDLE U-100 31G X 5/16"" 0.3 ML MISC": 3 refills | Status: DC

## 2016-11-23 NOTE — Patient Instructions (Addendum)
Please continue: - Metformin 1000 mg 2x a day with meals  Please change: - Levemir to 30 units at night - Mealtime Novolog:  - 4-6 units before a smaller meal - 8 units before a regular meal - 15-18 units before a large meal   Please continue: - Sliding scale Novolog: - 151-175: + 1 unit  - 176-200: + 2 units  - 201-225: + 3 units  - 226-250: + 4 units  - >250: + 5 units If sugars before the meal are 60 or lower, please do not take the Novolog dose. If sugars before the meal are 61-80, take only half of the Novolog dose.  Please return in 3 months with your sugar log.

## 2016-11-23 NOTE — Progress Notes (Signed)
Patient ID: Priscilla [name], female   DOB: 1951-02-14, 66 y.o.   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 She previously saw Drs Dwyane Dee (distant past) and [place] (more recently). Last visit with me 8 mo ago.  She has a h/o lap band >> grazes, especially more at night. Potato chips, icecream, sugar free cookies.  She had 2 Fe infusions 2 mo ago.  Last hemoglobin A1c was: 03/16/2017: HbA1c calculated from fructosamine: 5.6% 10/18/2015: HbA1c calculated from fructosamine: 5.7% 02/2015: HbA1c 7.4%, HbA1c calculated from fructosamine: 5.6% Lab Results  Component Value Date   HGBA1C 6.5 (H) 04/22/2016   HGBA1C 7.4 05/18/2015   HGBA1C 7.3 (A) 12/10/2014   She is on: - Metformin 1000 mg 2x a day with meals - Levemir 35 units at night - Mealtime Novolog:  - 4-6 units before a smaller meal - 8 units before a regular meal - 10-14 units before a large meal - Sliding scale Novolog: - 151-175: + 1 unit  - 176-200: + 2 units  - 201-225: + 3 units  - 226-250: + 4 units  - >250: + 5 units If sugars before the meal are 60 or lower, please do not take the Novolog dose. If sugars before the meal are 61-80, take only half of the Novolog dose.  Not doing this  She was on Victoza 2.5 mg daily >> stopped as she could not afford this.  Pt checks her sugars 2-3x a day and they are: - am: 82, 99-132, 172 >> 78-120, 133 >> 103-136, 159 >> 88-124, 132 - 2h after b'fast: 186-225 >> 175 >> 160, 177 >> 95-144, 151 >> 105, 160 - before lunch: 77, 91 >> 90-133, 217 >> 99 >> 131, 149 >> 83-132 - 2h after lunch:  76 (increased activity), 106, 128 >> 145, 185 >> 105-153 - before dinner: 110-134 >> 109-140, 173 (larger lunch) >> 116-147 >> 99-154, 187 - 2h after dinner: 194 >> 193 >> n/c >> 125-152, 270  >> 126-235 - bedtime: n/c >> 81-147 >> n/c >>  118-206, 252 >> 105-231 - nighttime: n/c Lowest sugar was 103 >> 83; she has hypoglycemia awareness at 70.  Highest sugar was 270 >> 235.  Glucometer: Freestyle  - No CKD, last BUN/creatinine:  Lab Results  Component Value Date   BUN 16 10/13/2016   CREATININE 0.90 10/13/2016  ACR (12/2014): 6.9 She is on Lisinopril 1.25 mg. - last set of lipids: Lab Results  Component Value Date   CHOL 162 08/25/2015   HDL 51.30 08/25/2015   LDLCALC 86 08/25/2015   TRIG 125.0 08/25/2015   CHOLHDL 3 08/25/2015  Off Pravastatin x 6 mo. - last eye exam was in 08/2016. 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 She did have stage I hypertensive retinopathy in both eyes. - she has numbness and tingling in her feet.   Lab Results  Component Value Date   TSH 1.82 04/25/2016   ROS: Constitutional: no weight gain/no weight loss, no fatigue, no subjective hyperthermia, no subjective hypothermia Eyes: no blurry vision, no xerophthalmia ENT: no sore throat, no nodules palpated in throat, no dysphagia, no odynophagia, no hoarseness Cardiovascular: no CP/no SOB/no palpitations/no leg swelling Respiratory: no cough/no SOB/no wheezing Gastrointestinal: no N/no V/no D/no C/no acid reflux Musculoskeletal: no muscle aches/no joint aches Skin: no rashes, no  hair loss Neurological: no tremors/+ numbness/+ tingling/no dizziness  I reviewed pt's medications, allergies, PMH, social hx, family hx, and changes were documented in the history of present illness. Otherwise, unchanged from my initial visit note.   Past Medical History:  Diagnosis Date  . Anemia 12/14/2008  . Anxiety and depression 12/14/2008  . Asthma 11/12/2006  . CAD (coronary artery disease) 12/14/2008   hx transluminal coronary angioplasty  . Cataract   . Depression   . Diabetes mellitus with neuropathy ([place]) 12/14/2008  . Fibromyalgia 01/04/2007   sees rheuamtology  . Gastric bypass status for obesity  08/31/2016  . Hyperlipemia 01/21/2010  . Hypertension 12/14/2008  . Leg edema 12/14/2008  . 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 OSA (obstructive sleep apnea) 08/06/2008  . Osteoarthritis 12/14/2008  . Palpitations 11/12/2006   hx sinus tachy  . Psoriatic arthritis [facility])    sees rheumatology  . Rotator cuff injury    Past Surgical History:  Procedure Laterality Date  . ABDOMINAL HYSTERECTOMY    . CHOLECYSTECTOMY    . LAPAROSCOPIC GASTRIC BANDING    . PTCA    . WRIST SURGERY     LEFT   Social History   Social History  . Marital Status: Married    Spouse Name: N/A  . Number of Children: N/A   Occupational History  . Not on file.   Social History Main Topics  . Smoking status: Never Smoker   . Smokeless tobacco: Never Used  . Alcohol Use: Yes     Comment: glass of wine-specially occasion  . Drug Use: No   Current Outpatient Prescriptions on File Prior to Visit  Medication Sig Dispense Refill  . albuterol (PROVENTIL HFA;VENTOLIN HFA) 108 (90 Base) MCG/ACT inhaler Inhale 2 puffs into the lungs every 6 (six) hours as needed for wheezing or shortness of breath. 1 Inhaler 0  . aspirin 81 MG tablet Take 81 mg by mouth daily.      . Biotin 5000 MCG TABS Take 5,000 mcg by mouth daily.     . calcium-vitamin D 250-100 MG-UNIT per tablet Take 1 tablet by mouth 2 (two) times daily.    . cyclobenzaprine (FLEXERIL) 5 MG tablet Take 1 tablet (5 mg total) by mouth 2 (two) times daily as needed for muscle spasms. 20 tablet 0  . DULoxetine (CYMBALTA) 60 MG capsule TAKE 1 CAPSULE BY MOUTH EVERY DAY 30 capsule 5  . FREESTYLE LITE test strip USE TO TEST BLOOD SUGAR TWICE A DAY 350 each 4  . Insulin Detemir (LEVEMIR FLEXTOUCH) 100 UNIT/ML Pen Inject 35 Units into the skin at bedtime. 15 mL 2  . Insulin Pen Needle (NOVOFINE) 32G X 6 MM MISC Use with levemir pen as instructed 100 each 3  . Insulin Syringe-Needle U-100 (B-D INS SYR MICROFINE 1CC/27G) 27G X 5/8" 1 ML MISC Use 4 times daily prior to meals  and at bedtime 120 each 6  . levalbuterol (XOPENEX HFA) 45 MCG/ACT inhaler Inhale 1-2 puffs into the lungs every 6 (six) hours as needed for wheezing. 1 Inhaler 2  . lisinopril (PRINIVIL,ZESTRIL) 2.5 MG tablet Take 1 tablet (2.5 mg total) by mouth daily. (Patient taking differently: Take 2.5 mg by mouth daily. ) 30 tablet 5  . metFORMIN (GLUCOPHAGE) 1000 MG tablet TAKE 1 TABLET BY MOUTH 2 TIMES DAILY WITH A MEAL 60 tablet 2  . metoprolol tartrate (LOPRESSOR) 25 MG tablet Take 1 tablet (25 mg total) by mouth 2 (two) times daily. 60 tablet 5  .  Multiple Vitamin (MULTIVITAMIN) tablet Take 1 tablet by mouth daily.     Marland Kitchen NOVOLOG FLEXPEN 100 UNIT/ML FlexPen Inject 6-8 Units into the skin 3 times daily with meals. Plus sliding scale. 15 mL 2  . Vitamin D, Ergocalciferol, (DRISDOL) 50000 units CAPS capsule Take 1 capsule (50,000 Units total) by mouth every 7 (seven) days. 15 capsule 3  . zolpidem (AMBIEN) 5 MG tablet Take 1 tablet (5 mg total) by mouth at bedtime as needed for sleep. 30 tablet 2  . glucagon (GLUCAGON EMERGENCY) 1 MG injection Inject 1 mg into the muscle once as needed. (Patient not taking: Reported on 11/10/2016) 1 each 12   No current facility-administered medications on file prior to visit.    No Known Allergies Family History  Problem Relation Age of Onset  . Other Mother        ALS  . Arthritis Mother   . Stroke Father   . Alcohol abuse Father   . Diabetes Father   . Heart disease Father   . Early death Father 48  . Diabetes Sister   . Arthritis Sister   . COPD Brother   . Arthritis Maternal Aunt   . Diabetes Maternal Aunt   . Early death Maternal Aunt 24  . Diabetes Maternal Uncle   . Cancer Paternal Aunt   . COPD Paternal 59   . Heart disease Paternal Aunt   . Heart disease Paternal Uncle   . Alcohol abuse Paternal Grandfather   . Heart disease Paternal Grandfather   . Early death Paternal Grandfather 37  . Stroke Paternal Grandfather    PE: BP 124/72 (BP  Location: Left Arm, Patient Position: Sitting)   Pulse 77   Ht 5\' 5"  (1.651 m)   Wt 171 lb (77.6 kg)   SpO2 96%   BMI 28.46 kg/m  Body mass index is 28.46 kg/m. Wt Readings from Last 3 Encounters:  11/23/16 171 lb (77.6 kg)  11/10/16 171 lb 12.8 oz (77.9 kg)  10/24/16 171 lb 4 oz (77.7 kg)   Constitutional: overweight, in NAD Eyes: PERRLA, EOMI, no exophthalmos ENT: moist mucous membranes, no thyromegaly, no cervical lymphadenopathy Cardiovascular: RRR, No MRG Respiratory: CTA B Gastrointestinal: abdomen soft, NT, ND, BS+ Musculoskeletal: no deformities, strength intact in all 4 Skin: moist, warm, no rashes Neurological: no tremor with outstretched hands, DTR normal in all 4  ASSESSMENT: 1. DM2, insulin-dependent, uncontrolled, with complications - Diabetic retinopathy and left eye - Peripheral neuropathy  2. HL   PLAN:  1. Patient with long-standing, uncontrolled diabetes, on oral antidiabetic regimen and basal-bolus insulin regimen, with sugars at target in the first half of the day, but increasing especially after dinner and at bedtime due to grazing at night. At last visit, I advised her to inject a small amount of insulin if she has a snack at night, but she is not doing so. As she snacks within 2 hours after dinner, we can increase the insulin with dinner cover her snacks. In that case, to avoid lows in the morning, I will advise her to decrease the Levemir dose. - A GLP-1 receptor agonist would be a good option for her, but these are not covered by her insurance. - I advised her to:  Patient Instructions  Please continue: - Metformin 1000 mg 2x a day with meals  Please change: - Levemir to 30 units at night - Mealtime Novolog:  - 4-6 units before a smaller meal - 8 units before a regular meal -  15-18 units before a large meal   Please continue: - Sliding scale Novolog: - 151-175: + 1 unit  - 176-200: + 2 units  - 201-225: + 3 units  - 226-250: + 4 units  -  >250: + 5 units If sugars before the meal are 60 or lower, please do not take the Novolog dose. If sugars before the meal are 61-80, take only half of the Novolog dose.  Please return in 3 months with your sugar log.   - today, HbA1c is 5.9% (great!). She did have to iron infusion 2 months ago. I suspect that her HbA1c may be slightly higher than measured, but will not repeated today. She may need a fructosamine level at next check.   - continue checking sugars at different times of the day - check 2-3x a day, rotating checks - advised for yearly eye exams >> she is UTD - Return to clinic in 3 mo with sugar log   2. HL - Patient was off pravastatin for last 6 months >> she had a new lipid panel today. We will last for records from PCP.   Philemon Kingdom, MD PhD [facility] Endocrinology

## 2016-11-24 LAB — POCT GLYCOSYLATED HEMOGLOBIN (HGB A1C): HEMOGLOBIN A1C: 5.9

## 2016-11-24 LAB — LIPID PANEL
Cholesterol: 221 mg/dL — ABNORMAL HIGH (ref <200)
HDL: 58 mg/dL (ref >50)
LDL CALC: 143 mg/dL — AB (ref <100)
TRIGLYCERIDES: 102 mg/dL (ref <150)
Total CHOL/HDL Ratio: 3.8 Ratio (ref <5.0)
VLDL: 20 mg/dL (ref <30)

## 2016-11-24 NOTE — Addendum Note (Signed)
Addended by: Caprice Beaver T on: 11/24/2016 01:17 PM   Modules accepted: Orders

## 2016-11-27 ENCOUNTER — Telehealth: Payer: Self-pay | Admitting: Family Medicine

## 2016-11-27 DIAGNOSIS — E785 Hyperlipidemia, unspecified: Secondary | ICD-10-CM

## 2016-11-27 NOTE — Telephone Encounter (Signed)
Please call patient: Her cholesterol is more elevated than last check. Given her history of hypertension, diabetes and her family history I would recommend she restart at least a low dose of the cholesterol medication. She was on pravastatin in the past, if she is agreeable I will call this in for her to restart.

## 2016-11-28 ENCOUNTER — Encounter: Payer: Self-pay | Admitting: *Deleted

## 2016-11-28 NOTE — Telephone Encounter (Signed)
Left detailed message with results and instructions for patient to call us back and let us know if she is willing to start a cholesterol medication. Also sent information in MY Chart.

## 2016-11-28 NOTE — Telephone Encounter (Signed)
Spoke with patient she is willing to start back on pravastatin . Patient would like to know when she should follow up after starting this medication . Please advise.

## 2016-11-29 ENCOUNTER — Encounter: Payer: Self-pay | Admitting: *Deleted

## 2016-11-29 MED ORDER — PRAVASTATIN SODIUM 20 MG PO TABS: 20.0000 mg | ORAL_TABLET | Freq: Every day | ORAL | 3 refills | Status: DC

## 2016-11-29 NOTE — Telephone Encounter (Signed)
Refilled pravastatin for her. Follow-up in 3 months with provider appointment and fasting labs.

## 2016-11-29 NOTE — Telephone Encounter (Signed)
Left message with information and instructions on patient voice mail also sent in [facility]  Chart.

## 2016-11-30 ENCOUNTER — Ambulatory Visit: Payer: Medicare Other | Admitting: Family Medicine

## 2016-12-14 ENCOUNTER — Other Ambulatory Visit: Payer: Self-pay | Admitting: Family Medicine

## 2016-12-14 MED ORDER — ALBUTEROL SULFATE HFA 108 (90 BASE) MCG/ACT IN AERS: 2.0000 | INHALATION_SPRAY | Freq: Four times a day (QID) | RESPIRATORY_TRACT | 1 refills | Status: DC | PRN

## 2016-12-14 NOTE — Telephone Encounter (Signed)
Patient requesting refill of albuterol inhaler.  Patient states she uses it only as needed and patient is feeling like she needs a refill.   Please send to [facility].

## 2016-12-19 ENCOUNTER — Telehealth (INDEPENDENT_AMBULATORY_CARE_PROVIDER_SITE_OTHER): Payer: Self-pay | Admitting: Physical Medicine and Rehabilitation

## 2016-12-19 NOTE — Telephone Encounter (Signed)
ok 

## 2016-12-19 NOTE — Telephone Encounter (Signed)
Scheduled for 9/24 at 1430.

## 2016-12-19 NOTE — Telephone Encounter (Signed)
Left message for patient to call back to schedule.  °

## 2016-12-26 ENCOUNTER — Other Ambulatory Visit: Payer: Self-pay | Admitting: Rheumatology

## 2016-12-26 MED ORDER — CYCLOBENZAPRINE HCL 10 MG PO TABS: 10.0000 mg | ORAL_TABLET | Freq: Every day | ORAL | 2 refills | Status: DC

## 2016-12-26 NOTE — Telephone Encounter (Signed)
Patient requesting a rx of Flexeril to be called into [facility] on [address]. Patient has not needed this for a long while, but back has started bothering her again. Patient is scheduled for injection next week.

## 2016-12-26 NOTE — Telephone Encounter (Signed)
Patient is requesting a refill on Flexeril. We have filled this prescription in the past. Last time was in 2016.   Last Visit: 08/17/16 Next Visit: 01/18/17  Okay to refill Flexeril?

## 2016-12-26 NOTE — Telephone Encounter (Signed)
ok 

## 2017-01-01 ENCOUNTER — Encounter (INDEPENDENT_AMBULATORY_CARE_PROVIDER_SITE_OTHER): Payer: Self-pay | Admitting: Physical Medicine and Rehabilitation

## 2017-01-01 ENCOUNTER — Ambulatory Visit (INDEPENDENT_AMBULATORY_CARE_PROVIDER_SITE_OTHER): Payer: Self-pay

## 2017-01-01 ENCOUNTER — Ambulatory Visit (INDEPENDENT_AMBULATORY_CARE_PROVIDER_SITE_OTHER): Payer: Medicare Other | Admitting: Physical Medicine and Rehabilitation

## 2017-01-01 VITALS — BP 111/72 | HR 71

## 2017-01-01 DIAGNOSIS — M47816 Spondylosis without myelopathy or radiculopathy, lumbar region: Secondary | ICD-10-CM | POA: Diagnosis not present

## 2017-01-01 MED ORDER — LIDOCAINE HCL (PF) 1 % IJ SOLN
2.0000 mL | Freq: Once | INTRAMUSCULAR | Status: AC
  Administered 2017-01-01: 2 mL

## 2017-01-01 MED ORDER — METHYLPREDNISOLONE ACETATE 80 MG/ML IJ SUSP
80.0000 mg | Freq: Once | INTRAMUSCULAR | Status: AC
  Administered 2017-01-01: 80 mg

## 2017-01-01 NOTE — Progress Notes (Deleted)
Increased pain for the past 3 weeks. Pain across low back and worse on left side and into left groin with moving leg. No pain down the leg.

## 2017-01-01 NOTE — Patient Instructions (Signed)

## 2017-01-03 ENCOUNTER — Other Ambulatory Visit: Payer: Self-pay | Admitting: Rheumatology

## 2017-01-03 DIAGNOSIS — Z9884 Bariatric surgery status: Secondary | ICD-10-CM | POA: Diagnosis not present

## 2017-01-03 NOTE — Procedures (Signed)
[name] is a 66 year old female followed by Dr. Ninfa Linden in Dr. Patrecia Pour. 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 We saw her in May of this year and completed facet joint block on the left at L4-5 with good relief of symptoms up until just the last few weeks. She's had return of symptoms without trauma or injury. She denies any leg pain. She denies any claudication symptoms. She does get some referral into the left groin with moving the leg. On exam there is no exquisite pain with rotation of the hip. 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 We'll repeat the facet joint block today and see how she does. If these are intermittent we can keep repeating these. If it becomes something that helps but short lived we could look ultimately radiofrequency ablation. She is obviously a surgical candidate for the stenosis if she were getting more radicular complaints.  Lumbar Facet Joint Intra-Articular Injection(s) with Fluoroscopic Guidance  Patient: Priscilla [name]      Date of Birth: [birth year] MRN: [id number] PCP: Ma Hillock, DO      Visit Date: 01/01/2017   Universal Protocol:    Date/Time: 01/01/2017  Consent Given By: the patient  Position: PRONE   Additional Comments: Vital signs were monitored before and after the procedure. Patient was prepped and draped in the usual sterile fashion. The correct patient, procedure, and site was verified.   Injection Procedure Details:  Procedure Site One Meds Administered:  Meds ordered this encounter  Medications  . lidocaine (PF) (XYLOCAINE) 1 % injection 2 mL  . methylPREDNISolone acetate (DEPO-MEDROL) injection 80 mg     Laterality: Left  Location/Site:  L4-L5  Needle size: 22 guage  Needle type: Spinal  Needle Placement: Articular  Findings:  -Contrast Used: 1 mL iohexol 180 mg iodine/mL   -Comments: Excellent flow of contrast producing a partial  arthrogram.  Procedure Details: The fluoroscope beam is vertically oriented in AP, and the inferior recess is visualized beneath the lower pole of the inferior apophyseal process, which represents the target point for needle insertion. When direct visualization is difficult the target point is located at the medial projection of the vertebral pedicle. The region overlying each aforementioned target is locally anesthetized with a 1 to 2 ml. volume of 1% Lidocaine without Epinephrine.   The spinal needle was inserted into each of the above mentioned facet joints using biplanar fluoroscopic guidance. A 0.25 to 0.5 ml. volume of Isovue-250 was injected and a partial facet joint arthrogram was obtained. A single spot film was obtained of the resulting arthrogram.    One to 1.25 ml of the steroid/anesthetic solution was then injected into each of the facet joints noted above.   Additional Comments:  The patient tolerated the procedure well Dressing: Band-Aid    Post-procedure details: Patient was observed during the procedure. Post-procedure instructions were reviewed.  Patient left the clinic in stable condition.

## 2017-01-04 ENCOUNTER — Other Ambulatory Visit: Payer: Self-pay | Admitting: Family Medicine

## 2017-01-04 DIAGNOSIS — Z1231 Encounter for screening mammogram for malignant neoplasm of breast: Secondary | ICD-10-CM

## 2017-01-08 ENCOUNTER — Encounter: Payer: Self-pay | Admitting: Skilled Nursing Facility1

## 2017-01-08 ENCOUNTER — Encounter: Payer: Medicare Other | Attending: Family Medicine | Admitting: Skilled Nursing Facility1

## 2017-01-08 DIAGNOSIS — Z9884 Bariatric surgery status: Secondary | ICD-10-CM | POA: Insufficient documentation

## 2017-01-08 DIAGNOSIS — Z713 Dietary counseling and surveillance: Secondary | ICD-10-CM | POA: Insufficient documentation

## 2017-01-08 NOTE — Progress Notes (Signed)
Post-Operative Lap Band Surgery  Primary concerns today: Post-operative Bariatric Surgery Nutrition Management.  Pt states in the winter she had episodes of passing out having iron infusions. Pt states she got the lap band 10 years ago with some fluid. Pt states she has trouble tolerating meat.  Pts A1C 5.9. Pt states she take care of her sister and forgetful husband which is very stressful. Pt states she has been very tired.   24-hr recall: B (AM): oatmeal with walnuts maybe some fruit Snk (AM):  L (PM): half a sandwich  Snk (PM): cheese and crackers or apple with peanutbutter  D (PM): hamburger helper or chicken breast and vegetables  Snk (PM): sometimes desserts   Fluid intake: regular coffee, 16.9 water, hot chia or green tea, orange flavored tea 16 ounces:  Estimated total protein intake: 70  Medications: See List Supplementation: biotin, vitamin d, centrum multivitamin, calcium   CBG monitoring: 3 times a day Average CBG per patient: 100-110 fasting Last patient reported A1c: 5.9  Using straws: no Drinking while eating: no Having you been chewing well: yes Chewing/swallowing difficulties: no just with big capsules  Changes in vision: some retinopathy and cataracts  Changes to mood/headaches: no Hair loss/Cahnges to skin/Changes to nails: yes hair thinning and brittle nails: getting better with infusions  Any difficulty focusing or concentrating: no Sweating: some hot flashes 30 minutes after eating Dizziness/Lightheaded: no Palpitations: no Carbonated beverages: die coke sometimes without setting well N/V/D/C/GAS: vomiting if eating too fast Abdominal Pain: no Dumping syndrome: no Last Lap-Band fill: 2 years ago  Recent physical activity:  Not since being anemic  Progress Towards Goal(s):  In progress.   Nutritional Diagnosis:  [place]-3.3 Overweight/obesity related to past poor dietary habits and physical inactivity as evidenced by patient w/ recent lap band surgery  following dietary guidelines for continued weight loss.    Intervention:  Nutrition counseling. Dietitian educated the pt on appropriate meal patterns and hydration.  Goals: -Get an appropriate multivitamin  -Also take calcium 3 times a day 2 hours from one another and 2 hours from your multivitamin  -Try the soy crumbles or tofu in the frozen aisle or refrigerator in the produce area -Quinoa is a grain that has good protein so it is in the rice ailse  -2 days a week do weight resistance  -Aim for 2 water bottles   Teaching Method Utilized: Visual Auditory Hands on  Barriers to learning/adherence to lifestyle change: none identified   Demonstrated degree of understanding via:  Teach Back   Monitoring/Evaluation:  Dietary intake, exercise, and body weight.

## 2017-01-08 NOTE — Patient Instructions (Addendum)
-  Get an appropriate multivitamin   -Also take calcium 3 times a day 2 hours from one another and 2 hours from your multivitamin   -Try the soy crumbles or tofu in the frozen aisle or refrigerator in the produce area  -Quinoa is a grain that has good protein so it is in the rice ailse   -2 days a week do weight resistance   -Aim for 2 water bottles

## 2017-01-12 ENCOUNTER — Ambulatory Visit (HOSPITAL_BASED_OUTPATIENT_CLINIC_OR_DEPARTMENT_OTHER): Payer: Medicare Other | Admitting: Family

## 2017-01-12 ENCOUNTER — Other Ambulatory Visit (HOSPITAL_BASED_OUTPATIENT_CLINIC_OR_DEPARTMENT_OTHER): Payer: Medicare Other

## 2017-01-12 VITALS — BP 124/73 | HR 68 | Temp 98.4°F | Resp 16 | Wt 169.0 lb

## 2017-01-12 DIAGNOSIS — R5383 Other fatigue: Secondary | ICD-10-CM

## 2017-01-12 DIAGNOSIS — Z9884 Bariatric surgery status: Secondary | ICD-10-CM

## 2017-01-12 DIAGNOSIS — K909 Intestinal malabsorption, unspecified: Secondary | ICD-10-CM

## 2017-01-12 DIAGNOSIS — E559 Vitamin D deficiency, unspecified: Secondary | ICD-10-CM

## 2017-01-12 DIAGNOSIS — D508 Other iron deficiency anemias: Secondary | ICD-10-CM

## 2017-01-12 LAB — CBC WITH DIFFERENTIAL (CANCER CENTER ONLY)
BASO#: 0 10*3/uL (ref 0.0–0.2)
BASO%: 0.5 % (ref 0.0–2.0)
EOS ABS: 0.2 10*3/uL (ref 0.0–0.5)
EOS%: 2.5 % (ref 0.0–7.0)
HCT: 43.8 % (ref 34.8–46.6)
HEMOGLOBIN: 14.8 g/dL (ref 11.6–15.9)
LYMPH#: 3.1 10*3/uL (ref 0.9–3.3)
LYMPH%: 35.8 % (ref 14.0–48.0)
MCH: 30.1 pg (ref 26.0–34.0)
MCHC: 33.8 g/dL (ref 32.0–36.0)
MCV: 89 fL (ref 81–101)
MONO#: 0.7 10*3/uL (ref 0.1–0.9)
MONO%: 8.4 % (ref 0.0–13.0)
NEUT#: 4.6 10*3/uL (ref 1.5–6.5)
NEUT%: 52.8 % (ref 39.6–80.0)
PLATELETS: 321 10*3/uL (ref 145–400)
RBC: 4.91 10*6/uL (ref 3.70–5.32)
RDW: 13 % (ref 11.1–15.7)
WBC: 8.7 10*3/uL (ref 3.9–10.0)

## 2017-01-12 NOTE — Progress Notes (Signed)
Hematology and Oncology Follow Up Visit  Priscilla [name] [id number] 1950/04/24 66 y.o. 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 She has had some fatigue. She last received IV iron in June and did well. Her Hgb is now 14.8 with an MCV of 89.  She denies any episodes of bleeding, bruising or petechiae.  No lymphadenopathy found on exam. She states that she will be having her mammogram in a couple weeks.  No fever, chills, chewing ice, n/v, cough, rash, dizziness, SOB, chest pain, palpitations, abdominal pain or changes in bowel or bladder habits.  No swelling or tenderness in her extremities. The neuropathy in her feet is unchanged.  No c/o pain at this time.  She has maintained a good appetite but admits that she needs to drink more fluids. Her weight is stable.   ECOG Performance Status: 1 - Symptomatic but completely ambulatory  Medications:  Allergies as of 01/12/2017   No Known Allergies     Medication List       Accurate as of 01/12/17  3:26 PM. Always use your most recent med list.          albuterol 108 (90 Base) MCG/ACT inhaler Commonly known as:  PROVENTIL HFA;VENTOLIN HFA Inhale 2 puffs into the lungs every 6 (six) hours as needed for wheezing or shortness of breath.   aspirin 81 MG tablet Take 81 mg by mouth daily.   Biotin 5000 MCG Tabs Take 5,000 mcg by mouth daily.   calcium-vitamin D 250-100 MG-UNIT tablet Take 1 tablet by mouth 2 (two) times daily.   cyclobenzaprine 5 MG tablet Commonly known as:  FLEXERIL Take 1 tablet (5 mg total) by mouth 2 (two) times daily as needed for muscle spasms.   cyclobenzaprine 10 MG tablet Commonly known as:  FLEXERIL Take 1 tablet (10 mg total) by mouth at bedtime.   DULoxetine 60 MG capsule Commonly  known as:  CYMBALTA TAKE 1 CAPSULE BY MOUTH EVERY DAY   FREESTYLE LITE test strip Generic drug:  glucose blood USE TO TEST BLOOD SUGAR TWICE A DAY   glucagon 1 MG injection Commonly known as:  GLUCAGON EMERGENCY Inject 1 mg into the muscle once as needed.   insulin detemir 100 UNIT/ML injection Commonly known as:  LEVEMIR Inject 0.3 mLs (30 Units total) into the skin at bedtime.   Insulin Pen Needle 32G X 6 MM Misc Commonly known as:  NOVOFINE Use with levemir pen as instructed   Insulin Syringe-Needle U-100 27G X 5/8" 1 ML Misc Commonly known as:  B-D INS SYR MICROFINE 1CC/27G Use 4 times daily prior to meals and at bedtime   Insulin Syringe-Needle U-100 31G X 5/16" 0.3 ML Misc Commonly known as:  B-D INSULIN SYRINGE Use 1x a day   levalbuterol 45 MCG/ACT inhaler Commonly known as:  XOPENEX HFA Inhale 1-2 puffs into the lungs every 6 (six) hours as needed for wheezing.   lisinopril 2.5 MG tablet Commonly known as:  PRINIVIL,ZESTRIL Take 1 tablet (2.5 mg total) by mouth daily.   metFORMIN 1000 MG tablet Commonly known as:  GLUCOPHAGE TAKE 1 TABLET BY MOUTH 2 TIMES DAILY WITH A MEAL   metoprolol tartrate 25 MG tablet Commonly known as:  LOPRESSOR Take 1 tablet (25 mg total) by  mouth 2 (two) times daily.   multivitamin tablet Take 1 tablet by mouth daily.   NOVOLOG FLEXPEN 100 UNIT/ML FlexPen Generic drug:  insulin aspart Inject 6-8 Units into the skin 3 times daily with meals. Plus sliding scale.   pravastatin 20 MG tablet Commonly known as:  PRAVACHOL Take 1 tablet (20 mg total) by mouth daily.   Vitamin D (Ergocalciferol) 50000 units Caps capsule Commonly known as:  DRISDOL Take 1 capsule (50,000 Units total) by mouth every 7 (seven) days.   zolpidem 5 MG tablet Commonly known as:  AMBIEN Take 1 tablet (5 mg total) by mouth at bedtime as needed for sleep.       Allergies: No Known Allergies  Past Medical History, Surgical history, Social history,  and Family History were reviewed and updated.  Review of Systems: All other 10 point review of systems is negative.   Physical Exam:  vitals were not taken for this visit.  Wt Readings from Last 3 Encounters:  01/08/17 172 lb (78 kg)  11/23/16 171 lb (77.6 kg)  11/10/16 171 lb 12.8 oz (77.9 kg)    Ocular: Sclerae unicteric, pupils equal, round and reactive to light Ear-nose-throat: Oropharynx clear, dentition fair Lymphatic: No cervical, supraclavicular or axillary adenopathy Lungs no rales or rhonchi, good excursion bilaterally Heart regular rate and rhythm, no murmur appreciated Abd soft, nontender, positive bowel sounds, no liver or spleen tip palpated on exam, no fluid wave  MSK no focal spinal tenderness, no joint edema Neuro: non-focal, well-oriented, appropriate affect Breasts: Deferred   Lab Results  Component Value Date   WBC 8.7 01/12/2017   HGB 14.8 01/12/2017   HCT 43.8 01/12/2017   MCV 89 01/12/2017   PLT 321 01/12/2017   Lab Results  Component Value Date   FERRITIN 138 10/13/2016   IRON 75 10/13/2016   TIBC 261 10/13/2016   UIBC 186 10/13/2016   IRONPCTSAT 29 10/13/2016   Lab Results  Component Value Date   RBC 4.91 01/12/2017   No results found for: KPAFRELGTCHN, LAMBDASER, KAPLAMBRATIO No results found for: IGGSERUM, IGA, IGMSERUM No results found for: Odetta Pink, SPEI   Chemistry      Component Value Date/Time   NA 141 10/13/2016 1503   K 4.9 10/13/2016 1503   CL 103 10/13/2016 1503   CO2 28 10/13/2016 1503   BUN 16 10/13/2016 1503   CREATININE 0.90 10/13/2016 1503      Component Value Date/Time   CALCIUM 9.5 10/13/2016 1503   ALKPHOS 72 10/13/2016 1503   AST 14 10/13/2016 1503   ALT 20 10/13/2016 1503   BILITOT 0.2 10/13/2016 1503      Impression and Plan: Priscilla [name] is a very pleasant 66 yo caucasian female with iron deficiency anemia secondary to malabsorption after having  the lap band surgery. She is feeling a little fatigued but Hgb remains stable.  We will see what her iron studies show and bring her back in next week for an infusion if needed.  She will contact our office with any questions or concerns. We can certainly see her sooner if need be.   Eliezer Bottom, NP 10/5/20183:26 PM

## 2017-01-13 LAB — VITAMIN D 25 HYDROXY (VIT D DEFICIENCY, FRACTURES): VIT D 25 HYDROXY: 45.9 ng/mL (ref 30.0–100.0)

## 2017-01-15 LAB — IRON AND TIBC
%SAT: 30 % (ref 21–57)
Iron: 91 ug/dL (ref 41–142)
TIBC: 300 ug/dL (ref 236–444)
UIBC: 209 ug/dL (ref 120–384)

## 2017-01-15 LAB — FERRITIN: Ferritin: 93 ng/ml (ref 9–269)

## 2017-01-18 ENCOUNTER — Ambulatory Visit: Payer: Medicare Other | Admitting: Rheumatology

## 2017-01-23 ENCOUNTER — Encounter: Payer: Self-pay | Admitting: Family

## 2017-01-26 DIAGNOSIS — Z23 Encounter for immunization: Secondary | ICD-10-CM | POA: Diagnosis not present

## 2017-02-02 ENCOUNTER — Other Ambulatory Visit: Payer: Self-pay | Admitting: Rheumatology

## 2017-02-02 NOTE — Telephone Encounter (Signed)
Last Visit: 08/17/16 Next Visit due in October 2018. Message sent to the front to schedule patient  Okay to refill Ambien?

## 2017-02-02 NOTE — Telephone Encounter (Signed)
ok 

## 2017-02-05 ENCOUNTER — Other Ambulatory Visit: Payer: Self-pay | Admitting: Internal Medicine

## 2017-02-05 ENCOUNTER — Ambulatory Visit
Admission: RE | Admit: 2017-02-05 | Discharge: 2017-02-05 | Disposition: A | Discharge status: Home / Self Care | Payer: Medicare Other | Source: Ambulatory Visit | Attending: Family Medicine | Admitting: Family Medicine

## 2017-02-05 ENCOUNTER — Other Ambulatory Visit: Payer: Self-pay | Admitting: Rheumatology

## 2017-02-05 DIAGNOSIS — Z1231 Encounter for screening mammogram for malignant neoplasm of breast: Secondary | ICD-10-CM

## 2017-02-27 ENCOUNTER — Ambulatory Visit: Payer: Medicare Other | Admitting: Internal Medicine

## 2017-03-05 ENCOUNTER — Ambulatory Visit (INDEPENDENT_AMBULATORY_CARE_PROVIDER_SITE_OTHER): Payer: Medicare Other | Admitting: Family Medicine

## 2017-03-05 ENCOUNTER — Encounter: Payer: Self-pay | Admitting: Family Medicine

## 2017-03-05 VITALS — BP 121/85 | HR 100 | Temp 98.5°F | Resp 20 | Wt 166.2 lb

## 2017-03-05 DIAGNOSIS — J0100 Acute maxillary sinusitis, unspecified: Secondary | ICD-10-CM | POA: Diagnosis not present

## 2017-03-05 MED ORDER — DOXYCYCLINE HYCLATE 100 MG PO TABS: 100.0000 mg | ORAL_TABLET | Freq: Two times a day (BID) | ORAL | 0 refills | Status: DC

## 2017-03-05 MED ORDER — METHYLPREDNISOLONE ACETATE 80 MG/ML IJ SUSP
80.0000 mg | Freq: Once | INTRAMUSCULAR | Status: AC
  Administered 2017-03-05: 80 mg via INTRAMUSCULAR

## 2017-03-05 NOTE — Patient Instructions (Signed)
Rest, HYDRATE with water.   + flonase, mucinex (DM if cough), nettie pot or nasal saline.  Doxycyline every 12 hours prescribed, take until completed.  Steroid shot today to help decrease your swollen glands.  If cough present it can last up to 6-8 weeks.  F/U 2 weeks of not improved.

## 2017-03-05 NOTE — Progress Notes (Signed)
Priscilla [name] , 08-26-1950, 66 y.o., female MRN: [id number] Patient Care Team    Relationship Specialty Notifications Start End  Ma Hillock, DO PCP - General Family Medicine  07/19/15    Comment: transfer to [place], Tonna Corner, MD Consulting Physician Orthopedic Surgery  08/06/15   Philemon Kingdom, MD Consulting Physician Internal Medicine  08/06/15    Comment: endocrine  Mcarthur Rossetti, MD Consulting Physician Orthopedic Surgery  08/06/15   Bo Merino, MD Consulting Physician Rheumatology  08/06/15   Volanda Napoleon, MD Consulting Physician Oncology  11/10/16   Magnus Sinning, MD Consulting Physician Physical Medicine and Rehabilitation  11/10/16   Dermatology, [facility]    11/10/16     Chief Complaint  Patient presents with  . URI    swollen glands,cough,dizziness,congestion,headache x 3 days     Subjective: Pt presents for an OV with complaints of swollen glands of 3 days duration.  Associated symptoms include fever, cough, dizziness, nasal congestion and headache. Her sinuses are painful and feel full.  She denies chills, nausea, vomit or diarrhea.  Pt has tried tylenol severe cold to ease their symptoms.   Depression screen [facility] 2/9 11/10/2016 10/24/2016 08/06/2015  Decreased Interest 2 0 0  Down, Depressed, Hopeless 2 0 0  PHQ - 2 Score 4 0 0  Altered sleeping 0 - -  Tired, decreased energy 0 - -  Change in appetite 0 - -  Feeling bad or failure about yourself  0 - -  Trouble concentrating 0 - -  Moving slowly or fidgety/restless 0 - -  Suicidal thoughts 0 - -  PHQ-9 Score 4 - -  Difficult doing work/chores Not difficult at all - -    No Known Allergies Social History   Tobacco Use  . Smoking status: Never Smoker  . Smokeless tobacco: Never Used  Substance Use Topics  . Alcohol use: Yes    Comment: glass of wine-specially occasion   Past Medical History:  Diagnosis Date  . Anemia 12/14/2008  . Anxiety and depression 12/14/2008  .  Asthma 11/12/2006  . CAD (coronary artery disease) 12/14/2008   hx transluminal coronary angioplasty  . Cataract   . Depression   . 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 Fibromyalgia 01/04/2007   sees rheuamtology  . 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 Hyperlipemia 01/21/2010  . Hypertension 12/14/2008  . Leg edema 12/14/2008  . 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 OSA (obstructive sleep apnea) 08/06/2008  . Osteoarthritis 12/14/2008  . Palpitations 11/12/2006   hx sinus tachy  . Psoriatic arthritis [facility])    sees rheumatology  . Rotator cuff injury    Past Surgical History:  Procedure Laterality Date  . ABDOMINAL HYSTERECTOMY    . CHOLECYSTECTOMY    . LAPAROSCOPIC GASTRIC BANDING    . PTCA    . WRIST SURGERY     LEFT   Family History  Problem Relation Age of Onset  . Other Mother        ALS  . Arthritis Mother   . Stroke Father   . Alcohol abuse Father   . Diabetes Father   . Heart disease Father   . Early death Father 78  . Diabetes Sister   . Arthritis Sister   . COPD Brother   . Arthritis Maternal Aunt   . Diabetes Maternal Aunt   . Early death Maternal Aunt 39  . Diabetes Maternal Uncle   . Cancer Paternal  Aunt   . COPD Paternal Aunt   . Heart disease Paternal Aunt   . Heart disease Paternal Uncle   . Alcohol abuse Paternal Grandfather   . Heart disease Paternal Grandfather   . Early death Paternal Grandfather 10  . Stroke Paternal Grandfather    Allergies as of 03/05/2017   No Known Allergies     Medication List        Accurate as of 03/05/17  1:07 PM. Always use your most recent med list.          albuterol 108 (90 Base) MCG/ACT inhaler Commonly known as:  PROVENTIL HFA;VENTOLIN HFA Inhale 2 puffs into the lungs every 6 (six) hours as needed for wheezing or shortness of breath.   aspirin 81 MG tablet Take 81 mg by mouth daily.   Biotin 5000 MCG Tabs Take 5,000 mcg by mouth daily.   calcium-vitamin D 250-100 MG-UNIT  tablet Take 1 tablet by mouth 2 (two) times daily.   cyclobenzaprine 5 MG tablet Commonly known as:  FLEXERIL Take 1 tablet (5 mg total) by mouth 2 (two) times daily as needed for muscle spasms.   DULoxetine 60 MG capsule Commonly known as:  CYMBALTA TAKE 1 CAPSULE BY MOUTH EVERY DAY   FREESTYLE LITE test strip Generic drug:  glucose blood USE TO TEST BLOOD SUGAR TWICE A DAY   glucagon 1 MG injection Commonly known as:  GLUCAGON EMERGENCY Inject 1 mg into the muscle once as needed.   insulin detemir 100 UNIT/ML injection Commonly known as:  LEVEMIR Inject 0.3 mLs (30 Units total) into the skin at bedtime.   Insulin Pen Needle 32G X 6 MM Misc Commonly known as:  NOVOFINE Use with levemir pen as instructed   Insulin Syringe-Needle U-100 27G X 5/8" 1 ML Misc Commonly known as:  B-D INS SYR MICROFINE 1CC/27G Use 4 times daily prior to meals and at bedtime   Insulin Syringe-Needle U-100 31G X 5/16" 0.3 ML Misc Commonly known as:  B-D INSULIN SYRINGE Use 1x a day   levalbuterol 45 MCG/ACT inhaler Commonly known as:  XOPENEX HFA Inhale 1-2 puffs into the lungs every 6 (six) hours as needed for wheezing.   lisinopril 2.5 MG tablet Commonly known as:  PRINIVIL,ZESTRIL Take 1 tablet (2.5 mg total) by mouth daily.   metFORMIN 1000 MG tablet Commonly known as:  GLUCOPHAGE TAKE 1 TABLET BY MOUTH 2 TIMES DAILY WITH A MEAL   metoprolol tartrate 25 MG tablet Commonly known as:  LOPRESSOR Take 1 tablet (25 mg total) by mouth 2 (two) times daily.   multivitamin tablet Take 1 tablet by mouth daily.   NOVOLOG FLEXPEN 100 UNIT/ML FlexPen Generic drug:  insulin aspart Inject 6-8 Units into the skin 3 times daily with meals. Plus sliding scale.   pravastatin 20 MG tablet Commonly known as:  PRAVACHOL Take 1 tablet (20 mg total) by mouth daily.   Vitamin D (Ergocalciferol) 50000 units Caps capsule Commonly known as:  DRISDOL Take 1 capsule (50,000 Units total) by mouth every 7  (seven) days.   zolpidem 5 MG tablet Commonly known as:  AMBIEN TAKE 1 TABLET BY MOUTH AT BEDTIME AS NEEDED FOR SLEEP       All past medical history, surgical history, allergies, family history, immunizations andmedications were updated in the EMR today and reviewed under the history and medication portions of their EMR.     ROS: Negative, with the exception of above mentioned in HPI   Objective:  BP  121/85 (BP Location: Right Arm, Patient Position: Sitting, Cuff Size: Large)   Pulse 100   Temp 98.5 F (36.9 C)   Resp 20   Wt 166 lb 4 oz (75.4 kg)   SpO2 98%   BMI 28.54 kg/m  Body mass index is 28.54 kg/m. Gen: Afebrile. No acute distress. Nontoxic in appearance, well developed, well nourished. Appears sick and tired.  HENT: AT. [place]. Bilateral TM visualized without erythema. MMM, no oral lesions. Bilateral nares with erythema, drainage. Throat without erythema or exudates. Mild cough and hoarseness present. TTP max sinus.  Eyes:Pupils Equal Round Reactive to light, Extraocular movements intact,  Conjunctiva without redness, discharge or icterus. Neck/lymp/endocrine: Supple, large bilateral cervical  lymphadenopathy CV: mild tachycardia. No edema.  Chest: CTAB, no wheeze or crackles.   Abd: Soft.NTND. BS present. no Masses palpated. Skin: no rashes, purpura or petechiae.  Neuro:  Normal gait. PERLA. EOMi. Alert. Oriented x3  No exam data present No results found. No results found for this or any previous visit (from the past 24 hour(s)).  Assessment/Plan: Priscilla [name] is a 66 y.o. female present for OV for  Acute maxillary sinusitis, recurrence not specified Rest, hydrate.  + flonase, mucinex (DM if cough), nettie pot or nasal saline.  Doxycyline every 12 hours prescribed, take until completed.  If cough present it can last up to 6-8 weeks.  F/U 2 weeks of not improved.    Reviewed expectations re: course of current medical issues.  Discussed self-management of  symptoms.  Outlined signs and symptoms indicating need for more acute intervention.  Patient verbalized understanding and all questions were answered.  Patient received an After-Visit Summary.    No orders of the defined types were placed in this encounter.    Note is dictated utilizing voice recognition software. Although note has been proof read prior to signing, occasional typographical errors still can be missed. If any questions arise, please do not hesitate to call for verification.   electronically signed by:  Howard Pouch, DO  [place]

## 2017-03-05 NOTE — Addendum Note (Signed)
Addended by: Leota Jacobsen on: 03/05/2017 01:28 PM   Modules accepted: Orders

## 2017-03-13 ENCOUNTER — Ambulatory Visit: Payer: Medicare Other | Admitting: Skilled Nursing Facility1

## 2017-03-29 NOTE — Progress Notes (Signed)
Office Visit Note  Patient: Priscilla [name]             Date of Birth: 04-01-1951           MRN: [id number]             PCP: Ma Hillock, DO Referring: Ma Hillock, DO Visit Date: 04/04/2017 Occupation: @GUAROCC @    Subjective:  Neck stiffness   History of Present Illness: Priscilla [name] is a 66 y.o. female with a history of psoriatic arthritis, osteoarthritis, fibromyalgia, and DDD of c-spine.  Patient states her fibromyalgia is well controlled with Cymbalta and Flexeril.  She states her neck continues to be stiff and cause her discomfort.  Flexeril helps with the muscle spasms. She states her insomnia has improved with Ambien 5 mg, which she takes on a PRN basis at night for sleep.  She states she saw a hematologist in the spring and had low ferritin, which she received two iron infusions for.  She states she is supposed to follow up with the hematologist January 2019 and has had increased fatigue again, so she thinks her ferritin may be low again.  She states her joint pain and stiffness is unchanged since her last visit.  She has no joint swelling.  She states she does not have any active psoriasis.  She is not taking anything for her psoriatic arthritis.  She states her right CMC is causing discomfort but attributes this to peeling potatoes for christmas.  She is going to continue to use ice.     Activities of Daily Living:  Patient reports morning stiffness for 1 hour.   Patient Denies nocturnal pain.  Difficulty dressing/grooming: Denies Difficulty climbing stairs: Denies Difficulty getting out of chair: Denies Difficulty using hands for taps, buttons, cutlery, and/or writing: Denies   Review of Systems  Constitutional: Positive for fatigue. Negative for weakness.  HENT: Positive for mouth sores and mouth dryness. Negative for nose dryness.   Eyes: Negative for redness and dryness.  Respiratory: Negative for cough, hemoptysis, shortness of breath and difficulty  breathing.   Cardiovascular: Negative.  Negative for chest pain, palpitations, hypertension and swelling in legs/feet.  Gastrointestinal: Negative.  Negative for blood in stool, constipation and diarrhea.  Endocrine: Negative for increased urination.  Genitourinary: Negative for painful urination.  Musculoskeletal: Positive for arthralgias, joint pain and morning stiffness. Negative for joint swelling, myalgias, muscle weakness, muscle tenderness and myalgias.  Skin: Negative for color change, pallor, rash, hair loss, nodules/bumps, redness, skin tightness, ulcers and sensitivity to sunlight.  Neurological: Negative for dizziness, numbness and headaches.  Hematological: Negative for swollen glands.  Psychiatric/Behavioral: Positive for sleep disturbance. Negative for depressed mood. The patient is not nervous/anxious.     PMFS History:  Patient Active Problem List   Diagnosis Date Noted  . Vitamin D deficiency 10/13/2016  . 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 Iron deficiency anemia 04/28/2016  . Other insomnia 03/14/2016  . DDD (degenerative disc disease), cervical 03/14/2016  . Spondylosis without myelopathy or radiculopathy, lumbar region 01/31/2016  . Medicare annual wellness visit, initial 08/06/2015  . Uncontrolled type 2 diabetes mellitus with retinopathy, with long-term current use of insulin ([place]) 05/18/2015  . Anxiety and depression 06/25/2014  . Psoriatic arthritis - sees rheumatologist 02/20/2014  . Asthma 12/14/2008  . PERIPHERAL NEUROPATHY 08/06/2008  . Hyperlipemia 11/12/2006  . Essential hypertension 11/12/2006    Past Medical History:  Diagnosis Date  .  Anemia 12/14/2008  . Anxiety and depression 12/14/2008  . Asthma 11/12/2006  . CAD (coronary artery disease) 12/14/2008   hx transluminal coronary angioplasty  . Cataract   . Depression   . Diabetes mellitus with neuropathy ([place]) 12/14/2008  . Fibromyalgia 01/04/2007   sees  rheuamtology  . 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 Hyperlipemia 01/21/2010  . Hypertension 12/14/2008  . Leg edema 12/14/2008  . 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 OSA (obstructive sleep apnea) 08/06/2008  . Osteoarthritis 12/14/2008  . Palpitations 11/12/2006   hx sinus tachy  . Psoriatic arthritis [facility])    sees rheumatology  . Rotator cuff injury     Family History  Problem Relation Age of Onset  . Other Mother        ALS  . Arthritis Mother   . Stroke Father   . Alcohol abuse Father   . Diabetes Father   . Heart disease Father   . Early death Father 60  . Diabetes Sister   . Arthritis Sister   . Arthritis Maternal Aunt   . Diabetes Maternal Aunt   . Early death Maternal Aunt 89  . Diabetes Maternal Uncle   . Cancer Paternal Aunt   . COPD Paternal 15   . Heart disease Paternal Aunt   . Heart disease Paternal Uncle   . Alcohol abuse Paternal Grandfather   . Heart disease Paternal Grandfather   . Early death Paternal Grandfather 44  . Stroke Paternal Grandfather   . Hypertension Son   . Autoimmune disease Daughter    Past Surgical History:  Procedure Laterality Date  . ABDOMINAL HYSTERECTOMY    . CHOLECYSTECTOMY    . LAPAROSCOPIC GASTRIC BANDING    . PTCA    . WRIST SURGERY     LEFT   Social History   Social History Narrative   Married, [name]. 2 children name Priscilla [name] and Priscilla [name].   Some college, retired Engineer, manufacturing.   Drinks caffeine.   Take a daily vitamin.   Wears her seatbelt, exercises 3 times a week.   Smoke detector in the home.   Firearms in the home.   Feels safe in her relationships.     Objective: Vital Signs: BP 132/74 (BP Location: Left Arm, Patient Position: Sitting, Cuff Size: Normal)   Pulse 65   Resp 14   Ht 5\' 4"  (1.626 m)   Wt 170 lb (77.1 kg)   BMI 29.18 kg/m    Physical Exam  Constitutional: She is oriented to person, place, and time. She appears well-developed and well-nourished.  HENT:  Head:  Normocephalic and atraumatic.  Eyes: Conjunctivae and EOM are normal.  Neck: Normal range of motion.  Cardiovascular: Normal rate, regular rhythm, normal heart sounds and intact distal pulses.  Pulmonary/Chest: Effort normal and breath sounds normal.  Abdominal: Soft. Bowel sounds are normal.  Lymphadenopathy:    She has no cervical adenopathy.  Neurological: She is alert and oriented to person, place, and time.  Skin: Skin is warm and dry. Capillary refill takes less than 2 seconds.  Psychiatric: She has a normal mood and affect. Her behavior is normal.  Nursing note and vitals reviewed.    Musculoskeletal Exam: C-spine limited ROM with discomfort.  Thoracic and lumbar good ROM.  No midline spinal tenderness.  No SI joint tenderness.  Left shoulder limited ROM due to torn rotator cuff.  Right shoulder full ROM.  Elbow joints, wrist joints, MCPs, PIPs, and DIPs good ROM with no  synovitis.  Hip joints, knee joints, ankle joints, MTPs, PIPs, and DIPs good ROM with no synovitis.  No trochanteric bursitis.  CDAI Exam: No CDAI exam completed.    Investigation: No additional findings. CBC Latest Ref Rng & Units 01/12/2017 10/13/2016 08/31/2016  WBC 3.9 - 10.0 10e3/uL 8.7 6.8 8.6  Hemoglobin 11.6 - 15.9 g/dL 14.8 13.0 12.1  Hematocrit 34.8 - 46.6 % 43.8 40.1 38.2  Platelets 145 - 400 10e3/uL 321 293 370   CMP Latest Ref Rng & Units 10/13/2016 08/31/2016 04/25/2016  Glucose 65 - 99 mg/dL 110(H) 126(H) -  BUN 8 - 27 mg/dL 16 16 -  Creatinine 0.57 - 1.00 mg/dL 0.90 0.77 -  Sodium 134 - 144 mmol/L 141 136 -  Potassium 3.5 - 5.2 mmol/L 4.9 4.5 -  Chloride 96 - 106 mmol/L 103 100 -  CO2 20 - 29 mmol/L 28 29 -  Calcium 8.7 - 10.3 mg/dL 9.5 9.5 9.3  Total Protein 6.0 - 8.5 g/dL 6.8 7.5 -  Total Bilirubin 0.0 - 1.2 mg/dL 0.2 0.3 -  Alkaline Phos 39 - 117 IU/L 72 74 -  AST 0 - 40 IU/L 14 15 -  ALT 0 - 32 IU/L 20 14 -    Imaging: No results found.  Speciality Comments: No specialty comments  available.    Procedures:  No procedures performed Allergies: Patient has no known allergies.   Assessment / Plan:     Visit Diagnoses: Psoriatic arthritis ([place]): No synovitis on exam.  No active psoriasis.  Patient is not on any medications for management of her psoriatic arthritis.  She is clinically doing very well.    Psoriasis: No active psoriasis patches.    Primary osteoarthritis of both hands: She has right CMC discomfort.  No joint swelling.  She is going to continue to rest and ice her right CMC joint.    Fibromyalgia: Well controlled with Cymbalta, Flexeril, and Ambien.  She was given a refill of Ambien 5 mg 1 tablet as needed at bedtime for sleep.    DDD (degenerative disc disease), cervical: She continues to have stiffness and discomfort.  Limited ROM with rotation.    Spondylosis without myelopathy or radiculopathy, lumbar region: chronic, doing well.   Other insomnia: She takes Ambien 5 mg as needed at bedtime for sleep.  A refill was sent to the pharmacy today.   History of vitamin D deficiency: Vitamin D level was normal jon 01/12/17.  She continues to take Vitamin D and calcium.    History of anemia: She is being followed by her hematologist.  Her next appointment is in January 2019.  She has had two previous iron infusions in spring 2018.  She feels that her fatigue has begun to worsen recently.    Other medical conditions are listed as follows:   History of scoliosis  History of carpal tunnel syndrome  History of diabetes mellitus  History of sleep apnea  History of asthma  History of depression  History of anxiety  History of hypertension  Gastric bypass status for obesity    Orders: No orders of the defined types were placed in this encounter.  Meds ordered this encounter  Medications  . zolpidem (AMBIEN) 5 MG tablet    Sig: Take 1 tablet (5 mg total) by mouth at bedtime as needed. for sleep    Dispense:  30 tablet    Refill:  2     Follow-Up Instructions: Return in about 6 months (around 10/03/2017) for  Psoriatic arthritis, Osteoarthritis, Fibromyalgia. Bo Merino, MD Note - This record has been created using Editor, commissioning.  Chart creation errors have been sought, but may not always  have been located. Such creation errors do not reflect on  the standard of medical care.

## 2017-04-04 ENCOUNTER — Encounter: Payer: Self-pay | Admitting: Rheumatology

## 2017-04-04 ENCOUNTER — Ambulatory Visit (INDEPENDENT_AMBULATORY_CARE_PROVIDER_SITE_OTHER): Payer: Medicare Other | Admitting: Rheumatology

## 2017-04-04 VITALS — BP 132/74 | HR 65 | Resp 14 | Ht 64.0 in | Wt 170.0 lb

## 2017-04-04 DIAGNOSIS — Z862 Personal history of diseases of the blood and blood-forming organs and certain disorders involving the immune mechanism: Secondary | ICD-10-CM

## 2017-04-04 DIAGNOSIS — G4709 Other insomnia: Secondary | ICD-10-CM

## 2017-04-04 DIAGNOSIS — M797 Fibromyalgia: Secondary | ICD-10-CM | POA: Diagnosis not present

## 2017-04-04 DIAGNOSIS — M19041 Primary osteoarthritis, right hand: Secondary | ICD-10-CM | POA: Diagnosis not present

## 2017-04-04 DIAGNOSIS — M47816 Spondylosis without myelopathy or radiculopathy, lumbar region: Secondary | ICD-10-CM

## 2017-04-04 DIAGNOSIS — L409 Psoriasis, unspecified: Secondary | ICD-10-CM | POA: Diagnosis not present

## 2017-04-04 DIAGNOSIS — L4050 Arthropathic psoriasis, unspecified: Secondary | ICD-10-CM | POA: Diagnosis not present

## 2017-04-04 DIAGNOSIS — Z8739 Personal history of other diseases of the musculoskeletal system and connective tissue: Secondary | ICD-10-CM

## 2017-04-04 DIAGNOSIS — Z8709 Personal history of other diseases of the respiratory system: Secondary | ICD-10-CM | POA: Diagnosis not present

## 2017-04-04 DIAGNOSIS — M5030 Other cervical disc degeneration, unspecified cervical region: Secondary | ICD-10-CM | POA: Diagnosis not present

## 2017-04-04 DIAGNOSIS — Z8659 Personal history of other mental and behavioral disorders: Secondary | ICD-10-CM

## 2017-04-04 DIAGNOSIS — Z8679 Personal history of other diseases of the circulatory system: Secondary | ICD-10-CM

## 2017-04-04 DIAGNOSIS — Z8669 Personal history of other diseases of the nervous system and sense organs: Secondary | ICD-10-CM

## 2017-04-04 DIAGNOSIS — Z8639 Personal history of other endocrine, nutritional and metabolic disease: Secondary | ICD-10-CM | POA: Diagnosis not present

## 2017-04-04 DIAGNOSIS — Z9884 Bariatric surgery status: Secondary | ICD-10-CM

## 2017-04-04 DIAGNOSIS — M19042 Primary osteoarthritis, left hand: Secondary | ICD-10-CM

## 2017-04-04 MED ORDER — ZOLPIDEM TARTRATE 5 MG PO TABS: 5.0000 mg | ORAL_TABLET | Freq: Every evening | ORAL | 2 refills | Status: DC | PRN

## 2017-04-06 ENCOUNTER — Other Ambulatory Visit: Payer: Self-pay | Admitting: Rheumatology

## 2017-04-07 ENCOUNTER — Other Ambulatory Visit: Payer: Self-pay | Admitting: Family Medicine

## 2017-04-13 ENCOUNTER — Ambulatory Visit (HOSPITAL_BASED_OUTPATIENT_CLINIC_OR_DEPARTMENT_OTHER): Payer: Medicare Other | Admitting: Family

## 2017-04-13 ENCOUNTER — Encounter: Payer: Self-pay | Admitting: Family

## 2017-04-13 ENCOUNTER — Other Ambulatory Visit: Payer: Self-pay

## 2017-04-13 ENCOUNTER — Other Ambulatory Visit (HOSPITAL_BASED_OUTPATIENT_CLINIC_OR_DEPARTMENT_OTHER): Payer: Medicare Other

## 2017-04-13 VITALS — BP 133/79 | HR 66 | Temp 98.2°F | Resp 20 | Wt 168.0 lb

## 2017-04-13 DIAGNOSIS — D509 Iron deficiency anemia, unspecified: Secondary | ICD-10-CM | POA: Diagnosis not present

## 2017-04-13 DIAGNOSIS — Z9884 Bariatric surgery status: Secondary | ICD-10-CM | POA: Diagnosis not present

## 2017-04-13 DIAGNOSIS — E559 Vitamin D deficiency, unspecified: Secondary | ICD-10-CM

## 2017-04-13 DIAGNOSIS — R42 Dizziness and giddiness: Secondary | ICD-10-CM

## 2017-04-13 DIAGNOSIS — K909 Intestinal malabsorption, unspecified: Secondary | ICD-10-CM

## 2017-04-13 DIAGNOSIS — R5383 Other fatigue: Secondary | ICD-10-CM

## 2017-04-13 DIAGNOSIS — D508 Other iron deficiency anemias: Secondary | ICD-10-CM

## 2017-04-13 DIAGNOSIS — R0602 Shortness of breath: Secondary | ICD-10-CM

## 2017-04-13 DIAGNOSIS — D510 Vitamin B12 deficiency anemia due to intrinsic factor deficiency: Secondary | ICD-10-CM

## 2017-04-13 LAB — CBC WITH DIFFERENTIAL (CANCER CENTER ONLY)
BASO#: 0.1 10*3/uL (ref 0.0–0.2)
BASO%: 0.8 % (ref 0.0–2.0)
EOS%: 5.4 % (ref 0.0–7.0)
Eosinophils Absolute: 0.5 10*3/uL (ref 0.0–0.5)
HCT: 42.4 % (ref 34.8–46.6)
HGB: 14 g/dL (ref 11.6–15.9)
LYMPH#: 2.9 10*3/uL (ref 0.9–3.3)
LYMPH%: 33.3 % (ref 14.0–48.0)
MCH: 29.9 pg (ref 26.0–34.0)
MCHC: 33 g/dL (ref 32.0–36.0)
MCV: 91 fL (ref 81–101)
MONO#: 0.6 10*3/uL (ref 0.1–0.9)
MONO%: 7.2 % (ref 0.0–13.0)
NEUT#: 4.6 10*3/uL (ref 1.5–6.5)
NEUT%: 53.3 % (ref 39.6–80.0)
Platelets: 316 10*3/uL (ref 145–400)
RBC: 4.68 10*6/uL (ref 3.70–5.32)
RDW: 13 % (ref 11.1–15.7)
WBC: 8.7 10*3/uL (ref 3.9–10.0)

## 2017-04-13 NOTE — Progress Notes (Signed)
Hematology and Oncology Follow Up Visit  Priscilla [name] [id number] 10-11-1950 67 y.o. 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 She is feeling quite fatigued and needing to sleep more throughout the day. It is affecting her quality of life. She has also noticed SOB with exertion and episodes of dizziness.  Her iron studies in October were stable. She has not had an infusion since June 2018.  No episodes of bleeding, no bruising or petechiae. No lymphadenopathy found on exam.  No fever, chills, n/v, cough, rash, chest pain, palpitations, abdominal pain or changes in bowel or bladder habits.  She has started a new bariatric multivitamin and this has caused her to have some constipation. She has cut back on this to once a day instead of 4 times a day.  The neuropathy in her feet seems to be a little worse. No swelling in her extremities.  No falls or syncopal episodes.  She has maintained a good appetite but admits that she needs to hydrated better. Her weight is stable.   ECOG Performance Status: 1 - Symptomatic but completely ambulatory  Medications:  Allergies as of 04/13/2017   No Known Allergies     Medication List        Accurate as of 04/13/17  4:14 PM. Always use your most recent med list.          albuterol 108 (90 Base) MCG/ACT inhaler Commonly known as:  PROVENTIL HFA;VENTOLIN HFA Inhale 2 puffs into the lungs every 6 (six) hours as needed for wheezing or shortness of breath.   aspirin 81 MG tablet Take 81 mg by mouth daily.   Biotin 5000 MCG Tabs Take 5,000 mcg by mouth daily.   calcium-vitamin D 250-100 MG-UNIT tablet Take 1 tablet by mouth 2 (two) times daily.   cyclobenzaprine 5 MG tablet Commonly known as:  FLEXERIL Take 1  tablet (5 mg total) by mouth 2 (two) times daily as needed for muscle spasms.   doxycycline 100 MG tablet Commonly known as:  VIBRA-TABS Take 1 tablet (100 mg total) by mouth 2 (two) times daily.   DULoxetine 60 MG capsule Commonly known as:  CYMBALTA TAKE 1 CAPSULE BY MOUTH EVERY DAY   FREESTYLE LITE test strip Generic drug:  glucose blood USE TO TEST BLOOD SUGAR TWICE A DAY   glucagon 1 MG injection Commonly known as:  GLUCAGON EMERGENCY Inject 1 mg into the muscle once as needed.   insulin detemir 100 UNIT/ML injection Commonly known as:  LEVEMIR Inject 0.3 mLs (30 Units total) into the skin at bedtime.   Insulin Pen Needle 32G X 6 MM Misc Commonly known as:  NOVOFINE Use with levemir pen as instructed   Insulin Syringe-Needle U-100 27G X 5/8" 1 ML Misc Commonly known as:  B-D INS SYR MICROFINE 1CC/27G Use 4 times daily prior to meals and at bedtime   Insulin Syringe-Needle U-100 31G X 5/16" 0.3 ML Misc Commonly known as:  B-D INSULIN SYRINGE Use 1x a day   levalbuterol 45 MCG/ACT inhaler Commonly known as:  XOPENEX HFA Inhale 1-2 puffs into the lungs every 6 (six) hours as needed for wheezing.   lisinopril 2.5 MG tablet Commonly known as:  PRINIVIL,ZESTRIL Take 1 tablet (2.5 mg total) by  mouth daily.   metFORMIN 1000 MG tablet Commonly known as:  GLUCOPHAGE TAKE 1 TABLET BY MOUTH 2 TIMES DAILY WITH A MEAL   metoprolol tartrate 25 MG tablet Commonly known as:  LOPRESSOR TAKE 1 TABLET BY MOUTH 2 TIMES DAILY   multivitamin tablet Take 1 tablet by mouth daily.   NOVOLOG FLEXPEN 100 UNIT/ML FlexPen Generic drug:  insulin aspart Inject 6-8 Units into the skin 3 times daily with meals. Plus sliding scale.   pravastatin 20 MG tablet Commonly known as:  PRAVACHOL Take 1 tablet (20 mg total) by mouth daily.   Vitamin D (Ergocalciferol) 50000 units Caps capsule Commonly known as:  DRISDOL Take 1 capsule (50,000 Units total) by mouth every 7 (seven) days.     zolpidem 5 MG tablet Commonly known as:  AMBIEN Take 1 tablet (5 mg total) by mouth at bedtime as needed. for sleep       Allergies: No Known Allergies  Past Medical History, Surgical history, Social history, and Family History were reviewed and updated.  Review of Systems: All other 10 point review of systems is negative.   Physical Exam:  weight is 168 lb (76.2 kg). Her oral temperature is 98.2 F (36.8 C). Her blood pressure is 133/79 and her pulse is 66. Her respiration is 20 and oxygen saturation is 98%.   Wt Readings from Last 3 Encounters:  04/13/17 168 lb (76.2 kg)  04/04/17 170 lb (77.1 kg)  03/05/17 166 lb 4 oz (75.4 kg)    Ocular: Sclerae unicteric, pupils equal, round and reactive to light Ear-nose-throat: Oropharynx clear, dentition fair Lymphatic: No cervical, supraclavicular or axillary adenopathy Lungs no rales or rhonchi, good excursion bilaterally Heart regular rate and rhythm, no murmur appreciated Abd soft, nontender, positive bowel sounds, no liver or spleen tip palpated on exam, no fluid wave  MSK no focal spinal tenderness, no joint edema Neuro: non-focal, well-oriented, appropriate affect Breasts: Deferred   Lab Results  Component Value Date   WBC 8.7 04/13/2017   HGB 14.0 04/13/2017   HCT 42.4 04/13/2017   MCV 91 04/13/2017   PLT 316 04/13/2017   Lab Results  Component Value Date   FERRITIN 93 01/12/2017   IRON 91 01/12/2017   TIBC 300 01/12/2017   UIBC 209 01/12/2017   IRONPCTSAT 30 01/12/2017   Lab Results  Component Value Date   RBC 4.68 04/13/2017   No results found for: KPAFRELGTCHN, LAMBDASER, KAPLAMBRATIO No results found for: IGGSERUM, IGA, IGMSERUM No results found for: Odetta Pink, SPEI   Chemistry      Component Value Date/Time   NA 141 10/13/2016 1503   K 4.9 10/13/2016 1503   CL 103 10/13/2016 1503   CO2 28 10/13/2016 1503   BUN 16 10/13/2016 1503    CREATININE 0.90 10/13/2016 1503      Component Value Date/Time   CALCIUM 9.5 10/13/2016 1503   ALKPHOS 72 10/13/2016 1503   AST 14 10/13/2016 1503   ALT 20 10/13/2016 1503   BILITOT 0.2 10/13/2016 1503      Impression and Plan: Priscilla [name] is a very pleasant 67 yo caucasian female with iron deficiency anemia secondary to malabsorption after gastric bypass. She is symptomatic at this time with fatigue, SOB with exertion and intermittent dizziness.  We will see what her iron studies show and bring her back in next week for an infusion if needed.  We also checked vitamin D and B 12 levels today as  well and results are pending.  We will see her back in another 8 weeks for follow-up and repeat lab work.  She will contact our office with any questions or conserns. We can certainly see her sooner if need be.   Priscilla Peace, NP 1/4/20194:14 PM

## 2017-04-14 LAB — IRON AND TIBC CHCC
Iron Saturation: 22 % (ref 15–55)
Iron: 65 ug/dL (ref 27–139)
Total Iron Binding Capacity: 291 ug/dL (ref 250–450)
UIBC: 226 ug/dL (ref 118–369)

## 2017-04-14 LAB — VITAMIN B12: VITAMIN B 12: 317 pg/mL (ref 232–1245)

## 2017-04-14 LAB — FERRITIN CHCC: Ferritin: 95 ng/mL (ref 15–150)

## 2017-04-14 LAB — VITAMIN D 25 HYDROXY (VIT D DEFICIENCY, FRACTURES): VIT D 25 HYDROXY: 29 ng/mL — AB (ref 30.0–100.0)

## 2017-04-18 ENCOUNTER — Telehealth: Payer: Self-pay | Admitting: *Deleted

## 2017-04-18 NOTE — Telephone Encounter (Addendum)
Patient is aware of results.   ----- Message from Eliezer Bottom, NP sent at 04/16/2017 12:36 PM EST ----- Regarding: labs Iron and B12 look good. Vit D low! Is she taking her 50,000 unit supplement once a week?  Priscilla [name]  ----- Message ----- From: Interface, Lab In Three Zero One Sent: 04/13/2017   3:03 PM To: Eliezer Bottom, NP

## 2017-04-25 ENCOUNTER — Ambulatory Visit (INDEPENDENT_AMBULATORY_CARE_PROVIDER_SITE_OTHER): Payer: Medicare Other | Admitting: Family Medicine

## 2017-04-25 ENCOUNTER — Encounter: Payer: Self-pay | Admitting: Family Medicine

## 2017-04-25 VITALS — BP 124/77 | HR 58 | Temp 98.2°F | Wt 170.0 lb

## 2017-04-25 DIAGNOSIS — F419 Anxiety disorder, unspecified: Secondary | ICD-10-CM

## 2017-04-25 DIAGNOSIS — Z9884 Bariatric surgery status: Secondary | ICD-10-CM | POA: Diagnosis not present

## 2017-04-25 DIAGNOSIS — E538 Deficiency of other specified B group vitamins: Secondary | ICD-10-CM

## 2017-04-25 DIAGNOSIS — I10 Essential (primary) hypertension: Secondary | ICD-10-CM

## 2017-04-25 DIAGNOSIS — F329 Major depressive disorder, single episode, unspecified: Secondary | ICD-10-CM | POA: Diagnosis not present

## 2017-04-25 DIAGNOSIS — R5383 Other fatigue: Secondary | ICD-10-CM | POA: Diagnosis not present

## 2017-04-25 LAB — TSH: TSH: 2.08 u[IU]/mL (ref 0.35–4.50)

## 2017-04-25 MED ORDER — DULOXETINE HCL 60 MG PO CPEP: ORAL_CAPSULE | ORAL | 0 refills | Status: DC

## 2017-04-25 MED ORDER — DULOXETINE HCL 30 MG PO CPEP: 30.0000 mg | ORAL_CAPSULE | Freq: Every day | ORAL | 0 refills | Status: DC

## 2017-04-25 NOTE — Progress Notes (Signed)
Priscilla [name] , [birth year], 67 y.o., female MRN: [id number] Patient Care Team    Relationship Specialty Notifications Start End  Ma Hillock, DO PCP - General Family Medicine  07/19/15    Comment: transfer to [place], Tonna Corner, MD Consulting Physician Orthopedic Surgery  08/06/15   Philemon Kingdom, MD Consulting Physician Internal Medicine  08/06/15    Comment: endocrine  Mcarthur Rossetti, MD Consulting Physician Orthopedic Surgery  08/06/15   Bo Merino, MD Consulting Physician Rheumatology  08/06/15   Volanda Napoleon, MD Consulting Physician Oncology  11/10/16   Magnus Sinning, MD Consulting Physician Physical Medicine and Rehabilitation  11/10/16   Dermatology, [facility]    11/10/16     Chief Complaint  Patient presents with  . Fatigue    pts complain of been fatigue and this has been going on for a long time     Subjective: Pt presents for an OV with complaints of fatigue that has occurred for > 3 months. Patient has a significant medical history of iron deficiency anemia, diabetes, insomnia, vitamin D deficiency and asthma. She also has had a gastric bypass surgery in the past. She is prescribed Flexeril and Ambien, which she states she rarely takes Flexeril. She is followed by hematology and her iron stores as of 04/13/2017 for now normal. Vitamin D at that time was 29, vitamin B-12 317. She denies any fever, swollen glands or unintentional weight loss. She reports she sometimes has mild night sweats but has been consistent with her diabetes. 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 She also states she is uncertain if it is her mood and if she is more depressed. She is prescribed Cymbalta.   Depression screen [facility] 2/9 04/25/2017 11/10/2016 10/24/2016 08/06/2015  Decreased Interest 0 2 0 0  Down, Depressed, Hopeless 3 2 0 0  PHQ - 2 Score 3 4 0 0  Altered sleeping 0 0 - -  Tired, decreased energy 3 0 - -  Change in  appetite 1 0 - -  Feeling bad or failure about yourself  0 0 - -  Trouble concentrating 0 0 - -  Moving slowly or fidgety/restless 0 0 - -  Suicidal thoughts 0 0 - -  PHQ-9 Score 7 4 - -  Difficult doing work/chores Somewhat difficult Not difficult at all - -    No Known Allergies Social History   Tobacco Use  . Smoking status: Never Smoker  . Smokeless tobacco: Never Used  Substance Use Topics  . Alcohol use: Yes    Comment: glass of wine-specially occasion   Past Medical History:  Diagnosis Date  . Anemia 12/14/2008  . Anxiety and depression 12/14/2008  . Asthma 11/12/2006  . CAD (coronary artery disease) 12/14/2008   hx transluminal coronary angioplasty  . Cataract   . Depression   . Diabetes mellitus with neuropathy ([place]) 12/14/2008  . Fibromyalgia 01/04/2007   sees rheuamtology  . 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 Hyperlipemia 01/21/2010  . Hypertension 12/14/2008  . Leg edema 12/14/2008  . 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 OSA (obstructive sleep apnea) 08/06/2008  . Osteoarthritis 12/14/2008  . Palpitations 11/12/2006   hx sinus tachy  . Psoriatic arthritis [facility])    sees rheumatology  . Rotator cuff injury    Past Surgical History:  Procedure Laterality Date  . ABDOMINAL HYSTERECTOMY    . CHOLECYSTECTOMY    . LAPAROSCOPIC  GASTRIC BANDING    . PTCA    . WRIST SURGERY     LEFT   Family History  Problem Relation Age of Onset  . Other Mother        ALS  . Arthritis Mother   . Stroke Father   . Alcohol abuse Father   . Diabetes Father   . Heart disease Father   . Early death Father 100  . Diabetes Sister   . Arthritis Sister   . Arthritis Maternal Aunt   . Diabetes Maternal Aunt   . Early death Maternal Aunt 29  . Diabetes Maternal Uncle   . Cancer Paternal Aunt   . COPD Paternal 65   . Heart disease Paternal Aunt   . Heart disease Paternal Uncle   . Alcohol abuse Paternal Grandfather   . Heart disease Paternal Grandfather   . Early death  Paternal Grandfather 61  . Stroke Paternal Grandfather   . Hypertension Son   . Autoimmune disease Daughter    Allergies as of 04/25/2017   No Known Allergies     Medication List        Accurate as of 04/25/17  9:09 AM. Always use your most recent med list.          albuterol 108 (90 Base) MCG/ACT inhaler Commonly known as:  PROVENTIL HFA;VENTOLIN HFA Inhale 2 puffs into the lungs every 6 (six) hours as needed for wheezing or shortness of breath.   aspirin 81 MG tablet Take 81 mg by mouth daily.   Biotin 5000 MCG Tabs Take 5,000 mcg by mouth daily.   calcium-vitamin D 250-100 MG-UNIT tablet Take 1 tablet by mouth 2 (two) times daily.   cyclobenzaprine 5 MG tablet Commonly known as:  FLEXERIL Take 1 tablet (5 mg total) by mouth 2 (two) times daily as needed for muscle spasms.   doxycycline 100 MG tablet Commonly known as:  VIBRA-TABS Take 1 tablet (100 mg total) by mouth 2 (two) times daily.   DULoxetine 60 MG capsule Commonly known as:  CYMBALTA TAKE 1 CAPSULE BY MOUTH EVERY DAY   FREESTYLE LITE test strip Generic drug:  glucose blood USE TO TEST BLOOD SUGAR TWICE A DAY   glucagon 1 MG injection Commonly known as:  GLUCAGON EMERGENCY Inject 1 mg into the muscle once as needed.   insulin detemir 100 UNIT/ML injection Commonly known as:  LEVEMIR Inject 0.3 mLs (30 Units total) into the skin at bedtime.   Insulin Pen Needle 32G X 6 MM Misc Commonly known as:  NOVOFINE Use with levemir pen as instructed   Insulin Syringe-Needle U-100 27G X 5/8" 1 ML Misc Commonly known as:  B-D INS SYR MICROFINE 1CC/27G Use 4 times daily prior to meals and at bedtime   Insulin Syringe-Needle U-100 31G X 5/16" 0.3 ML Misc Commonly known as:  B-D INSULIN SYRINGE Use 1x a day   levalbuterol 45 MCG/ACT inhaler Commonly known as:  XOPENEX HFA Inhale 1-2 puffs into the lungs every 6 (six) hours as needed for wheezing.   lisinopril 2.5 MG tablet Commonly known as:   PRINIVIL,ZESTRIL Take 1 tablet (2.5 mg total) by mouth daily.   metFORMIN 1000 MG tablet Commonly known as:  GLUCOPHAGE TAKE 1 TABLET BY MOUTH 2 TIMES DAILY WITH A MEAL   metoprolol tartrate 25 MG tablet Commonly known as:  LOPRESSOR TAKE 1 TABLET BY MOUTH 2 TIMES DAILY   multivitamin tablet Take 1 tablet by mouth daily.   NOVOLOG FLEXPEN 100  UNIT/ML FlexPen Generic drug:  insulin aspart Inject 6-8 Units into the skin 3 times daily with meals. Plus sliding scale.   pravastatin 20 MG tablet Commonly known as:  PRAVACHOL Take 1 tablet (20 mg total) by mouth daily.   Vitamin D (Ergocalciferol) 50000 units Caps capsule Commonly known as:  DRISDOL Take 1 capsule (50,000 Units total) by mouth every 7 (seven) days.   zolpidem 5 MG tablet Commonly known as:  AMBIEN Take 1 tablet (5 mg total) by mouth at bedtime as needed. for sleep       All past medical history, surgical history, allergies, family history, immunizations andmedications were updated in the EMR today and reviewed under the history and medication portions of their EMR.     ROS: Negative, with the exception of above mentioned in HPI   Objective:  BP 124/77 (BP Location: Left Arm, Patient Position: Sitting, Cuff Size: Normal)   Pulse (!) 58   Temp 98.2 F (36.8 C) (Oral)   Wt 170 lb (77.1 kg)   SpO2 100%   BMI 29.18 kg/m  Body mass index is 29.18 kg/m. Gen: Afebrile. No acute distress. Nontoxic in appearance, well developed, well nourished. Pleasant, Caucasian female. HENT: AT. [place].  MMM, no oral lesions. Bilateral nares without erythema or drainage. Throat without erythema or exudates. No cough, no hoarseness. Eyes:Pupils Equal Round Reactive to light, Extraocular movements intact,  Conjunctiva without redness, discharge or icterus. Neck/lymp/endocrine: Supple, no lymphadenopathy CV: RRR no murmur, no edema Chest: CTAB, no wheeze or crackles. Good air movement, normal resp effort.  Abd: Soft. NTND. BS  present.  Skin: No rashes, purpura or petechiae.  Neuro: Normal gait. PERLA. EOMi. Alert. Oriented x3  Psych: Normal affect, dress and demeanor. Normal speech. Normal thought content and judgment.  No exam data present No results found. No results found for this or any previous visit (from the past 24 hour(s)).  Assessment/Plan: DESSIRAE SCAROLA is a 67 y.o. female present for OV for  Other fatigue/Gastric bypass status for obesity - Vitamin deficiencies of the consideration of causes, given her malabsorption secondary to gastric bypass. I am stores are well supplemented. Encouraged her to start vitamin D and B 12 daily. - Patient follows with endocrine for her diabetes, last a1c in the system should well controlled blood sugars. - TSH- check thyroid as potential cause. B12 deficiency - Patient advised to start daily B-12 over-the-counter supplement. If sublingual solution available would be her best option. - Retest B-12 in 4 weeks, if not absorbing well secondary to gastric bypass would start B-12 injections for her. Essential hypertension - Stable. - TSH Anxiety/depression: - Considered as potential cause. She does endorse some depression signs and symptoms. Discussed options with her today, and she is willing to try an increase in her Cymbalta.  - Increased Cymbalta to a daily combined dose of 90 mg. - Follow-up in 4 weeks   Reviewed expectations re: course of current medical issues.  Discussed self-management of symptoms.  Outlined signs and symptoms indicating need for more acute intervention.  Patient verbalized understanding and all questions were answered.  Patient received an After-Visit Summary.    No orders of the defined types were placed in this encounter.    Note is dictated utilizing voice recognition software. Although note has been proof read prior to signing, occasional typographical errors still can be missed. If any questions arise, please do not  hesitate to call for verification.   electronically signed by:  Howard Pouch, DO  [facility] [place]

## 2017-04-25 NOTE — Patient Instructions (Addendum)
Start b12 1000 mcg daily (100 mg). Increase Cymbalta to 1 60 mg capsule and 1 30 mg capsule daily. Checking thyroid today and I will call you with results. Take prescribed weekly and a daily OTC Vit D 400-600.  Follow up 4 weeks.     If you still need the inhaler frequently, please be seen. This may be anxiety related.

## 2017-04-30 ENCOUNTER — Encounter: Payer: Self-pay | Admitting: Family Medicine

## 2017-04-30 ENCOUNTER — Ambulatory Visit: Payer: Medicare Other | Admitting: Internal Medicine

## 2017-05-09 ENCOUNTER — Other Ambulatory Visit: Payer: Self-pay | Admitting: Internal Medicine

## 2017-05-23 ENCOUNTER — Ambulatory Visit: Payer: Medicare Other | Admitting: Family Medicine

## 2017-06-08 ENCOUNTER — Other Ambulatory Visit: Payer: Medicare Other

## 2017-06-08 ENCOUNTER — Ambulatory Visit: Payer: Medicare Other | Admitting: Family

## 2017-06-11 ENCOUNTER — Other Ambulatory Visit: Payer: Self-pay | Admitting: Family Medicine

## 2017-06-13 ENCOUNTER — Encounter: Payer: Self-pay | Admitting: Internal Medicine

## 2017-06-13 ENCOUNTER — Ambulatory Visit (INDEPENDENT_AMBULATORY_CARE_PROVIDER_SITE_OTHER): Payer: Medicare Other | Admitting: Internal Medicine

## 2017-06-13 VITALS — BP 114/72 | HR 58 | Ht 64.0 in | Wt 170.0 lb

## 2017-06-13 DIAGNOSIS — E11319 Type 2 diabetes mellitus with unspecified diabetic retinopathy without macular edema: Secondary | ICD-10-CM

## 2017-06-13 DIAGNOSIS — Z794 Long term (current) use of insulin: Secondary | ICD-10-CM

## 2017-06-13 DIAGNOSIS — E1165 Type 2 diabetes mellitus with hyperglycemia: Secondary | ICD-10-CM | POA: Diagnosis not present

## 2017-06-13 DIAGNOSIS — E785 Hyperlipidemia, unspecified: Secondary | ICD-10-CM | POA: Diagnosis not present

## 2017-06-13 DIAGNOSIS — IMO0002 Reserved for concepts with insufficient information to code with codable children: Secondary | ICD-10-CM

## 2017-06-13 LAB — POCT GLYCOSYLATED HEMOGLOBIN (HGB A1C): HEMOGLOBIN A1C: 6.3

## 2017-06-13 NOTE — Progress Notes (Signed)
Patient ID: Priscilla [name], female   DOB: 1951-03-12, 67 y.o.   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 She previously saw Drs Dwyane Dee (distant past) and [place] (more recently). Last visit with me 7 months ago.  She has a history of lap band >> no large meals, but usually grazes, especially at night.    She gets iron infusions - last 01/2017.  She had URIs >> steroid inj 03/2017 >> sugars higher.  Last hemoglobin A1c was: 03/16/2017: HbA1c calculated from fructosamine: 5.6% 10/18/2015: HbA1c calculated from fructosamine: 5.7% 02/2015: HbA1c 7.4%, HbA1c calculated from fructosamine: 5.6% Lab Results  Component Value Date   HGBA1C 5.9 11/24/2016   HGBA1C 6.5 (H) 04/22/2016   HGBA1C 7.4 05/18/2015   She is on: - Metformin 1000 mg 2x a day with meals - Levemir 30 units at night - Mealtime Novolog:  - 4-6 units before a smaller meal - 8 -10 units before a regular meal - 12-15 units before a large meal  - Sliding scale Novolog: - 151-175: + 1 unit  - 176-200: + 2 units  - 201-225: + 3 units  - 226-250: + 4 units  - >250: + 5 units If sugars before the meal are 60 or lower, please do not take the Novolog dose. If sugars before the meal are 61-80, take only half of the Novolog dose.  She was on Victoza 2.5 mg daily >> stopped as she could not afford this.  Pt checks her sugars 2-3 times a day - reviewed log: - am: 103-136, 159 >> 88-124, 132 >> 78-118, 133 - 2h after b'fast: 95-144, 151 >> 105, 160 >> 96 - before lunch: 131, 149 >> 83-132 >> 79-119 - 2h after lunch: 145, 185 >> 105-153 >> 111-130 - before dinner: 116-147 >> 99-154, 187 >> 120-155, 171 - 2h after dinner:125-152, 270  >> 126-235 >> 133-190, 220 - bedtime: 118-206, 252 >> 105-231 >> 88-180 - nighttime: n/c Lowest sugar was 103 >> 83 >> 78; she has hypoglycemia  awareness at 70.  Highest sugar was 270 >> 235 >> 222.  Glucometer: Freestyle  -No CKD, last BUN/creatinine:  Lab Results  Component Value Date   BUN 16 10/13/2016   CREATININE 0.90 10/13/2016  ACR (12/2014): 6.9 On lisinopril 1.25 mg daily. -+ HL; last set of lipids: Lab Results  Component Value Date   CHOL 221 (H) 11/23/2016   HDL 58 11/23/2016   LDLCALC 143 (H) 11/23/2016   TRIG 102 11/23/2016   CHOLHDL 3.8 11/23/2016  She was off pravastatin for 6 months before lipid panel from 11/2016.  Now back on it. - last eye exam was in 08/2016. 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 - + Numbness and tingling in her feet. + B12.  Last TSH reviewed and this was normal:  Lab Results  Component Value Date   TSH 2.08 04/25/2017   ROS: Constitutional: no weight gain/no weight loss, no fatigue, no subjective hyperthermia, no subjective hypothermia Eyes: no blurry vision, no xerophthalmia ENT: no sore throat, no nodules palpated in throat, no dysphagia, no odynophagia, no hoarseness Cardiovascular: no CP/no SOB/no palpitations/no leg swelling Respiratory: no cough/no SOB/no wheezing Gastrointestinal: no N/no V/no D/no C/no acid reflux Musculoskeletal: no muscle aches/no joint aches Skin: no rashes, no hair loss Neurological:  no tremors/+ numbness/+ tingling/no dizziness  I reviewed pt's medications, allergies, PMH, social hx, family hx, and changes were documented in the history of present illness. Otherwise, unchanged from my initial visit note. Started C[place]ta for depression.  Past Medical History:  Diagnosis Date  . Anemia 12/14/2008  . Anxiety and depression 12/14/2008  . Asthma 11/12/2006  . CAD (coronary artery disease) 12/14/2008   hx transluminal coronary angioplasty  . Cataract   . Depression   . Diabetes mellitus with neuropathy ([place]) 12/14/2008  . Fibromyalgia 01/04/2007   sees rheuamtology  .  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 Hyperlipemia 01/21/2010  . Hypertension 12/14/2008  . Leg edema 12/14/2008  . 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 OSA (obstructive sleep apnea) 08/06/2008  . Osteoarthritis 12/14/2008  . Palpitations 11/12/2006   hx sinus tachy  . Psoriatic arthritis [facility])    sees rheumatology  . Rotator cuff injury    Past Surgical History:  Procedure Laterality Date  . ABDOMINAL HYSTERECTOMY    . CHOLECYSTECTOMY    . LAPAROSCOPIC GASTRIC BANDING    . PTCA    . WRIST SURGERY     LEFT   Social History   Social History  . Marital Status: Married    Spouse Name: N/A  . Number of Children: N/A   Occupational History  . Not on file.   Social History Main Topics  . Smoking status: Never Smoker   . Smokeless tobacco: Never Used  . Alcohol Use: Yes     Comment: glass of wine-specially occasion  . Drug Use: No   Current Outpatient Medications on File Prior to Visit  Medication Sig Dispense Refill  . albuterol (PROVENTIL HFA;VENTOLIN HFA) 108 (90 Base) MCG/ACT inhaler Inhale 2 puffs into the lungs every 6 (six) hours as needed for wheezing or shortness of breath. 1 Inhaler 1  . aspirin 81 MG tablet Take 81 mg by mouth daily.      . Biotin 5000 MCG TABS Take 5,000 mcg by mouth daily.     . calcium-vitamin D 250-100 MG-UNIT per tablet Take 1 tablet by mouth 2 (two) times daily.    . cyclobenzaprine (FLEXERIL) 5 MG tablet Take 1 tablet (5 mg total) by mouth 2 (two) times daily as needed for muscle spasms. 20 tablet 0  . DULoxetine (CYMBALTA) 30 MG capsule Take 1 capsule (30 mg total) by mouth daily. 90 capsule 0  . DULoxetine (CYMBALTA) 60 MG capsule TAKE 1 CAPSULE BY MOUTH EVERY DAY 90 capsule 0  . FREESTYLE LITE test strip USE TO TEST BLOOD SUGAR TWICE A DAY 350 each 4  . glucagon (GLUCAGON EMERGENCY) 1 MG injection Inject 1 mg into the muscle once as needed. 1 each 12  . insulin detemir (LEVEMIR) 100 UNIT/ML injection Inject 0.3 mLs (30 Units total) into  the skin at bedtime. 30 mL 3  . Insulin Pen Needle (NOVOFINE) 32G X 6 MM MISC Use with levemir pen as instructed 100 each 3  . Insulin Syringe-Needle U-100 (B-D INS SYR MICROFINE 1CC/27G) 27G X 5/8" 1 ML MISC Use 4 times daily prior to meals and at bedtime 120 each 6  . Insulin Syringe-Needle U-100 (B-D INSULIN SYRINGE) 31G X 5/16" 0.3 ML MISC Use 1x a day 100 each 3  . levalbuterol (XOPENEX HFA) 45 MCG/ACT inhaler Inhale 1-2 puffs into the lungs every 6 (six) hours as needed for wheezing. 1 Inhaler 2  . lisinopril (PRINIVIL,ZESTRIL) 2.5 MG tablet Take 1 tablet (2.5 mg total)  by mouth daily. (Patient taking differently: Take 2.5 mg by mouth daily. ) 30 tablet 5  . metFORMIN (GLUCOPHAGE) 1000 MG tablet TAKE 1 TABLET BY MOUTH 2 TIMES DAILY WITH A MEAL 60 tablet 2  . metoprolol tartrate (LOPRESSOR) 25 MG tablet TAKE 1 TABLET BY MOUTH 2 TIMES DAILY 180 tablet 0  . Multiple Vitamin (MULTIVITAMIN) tablet Take 1 tablet by mouth daily.     Marland Kitchen NOVOLOG FLEXPEN 100 UNIT/ML FlexPen Inject 6-8 Units into the skin 3 times daily with meals. Plus sliding scale. 15 mL 2  . pravastatin (PRAVACHOL) 20 MG tablet Take 1 tablet (20 mg total) by mouth daily. 90 tablet 3  . Vitamin D, Ergocalciferol, (DRISDOL) 50000 units CAPS capsule Take 1 capsule (50,000 Units total) by mouth every 7 (seven) days. 15 capsule 3  . zolpidem (AMBIEN) 5 MG tablet Take 1 tablet (5 mg total) by mouth at bedtime as needed. for sleep 30 tablet 2   No current facility-administered medications on file prior to visit.    No Known Allergies Family History  Problem Relation Age of Onset  . Other Mother        ALS  . Arthritis Mother   . Stroke Father   . Alcohol abuse Father   . Diabetes Father   . Heart disease Father   . Early death Father 28  . Diabetes Sister   . Arthritis Sister   . Arthritis Maternal Aunt   . Diabetes Maternal Aunt   . Early death Maternal Aunt 50  . Diabetes Maternal Uncle   . Cancer Paternal Aunt   . COPD  Paternal 40   . Heart disease Paternal Aunt   . Heart disease Paternal Uncle   . Alcohol abuse Paternal Grandfather   . Heart disease Paternal Grandfather   . Early death Paternal Grandfather 74  . Stroke Paternal Grandfather   . Hypertension Son   . Autoimmune disease Daughter    PE: BP 114/72   Pulse (!) 58   Ht 5\' 4"  (1.626 m)   Wt 170 lb (77.1 kg)   SpO2 98%   BMI 29.18 kg/m  Body mass index is 29.18 kg/m. Wt Readings from Last 3 Encounters:  06/13/17 170 lb (77.1 kg)  04/25/17 170 lb (77.1 kg)  04/13/17 168 lb (76.2 kg)   Constitutional: overweight, in NAD Eyes: PERRLA, EOMI, no exophthalmos ENT: moist mucous membranes, no thyromegaly, no cervical lymphadenopathy Cardiovascular: RRR, No MRG Respiratory: CTA B Gastrointestinal: abdomen soft, NT, ND, BS+ Musculoskeletal: no deformities, strength intact in all 4 Skin: moist, warm, no rashes Neurological: no tremor with outstretched hands, DTR normal in all 4  ASSESSMENT: 1. DM2, insulin-dependent, uncontrolled, with complications - Diabetic retinopathy and left eye - Peripheral neuropathy  2. HL  3. PN   PLAN:  1. Patient with long-standing, uncontrolled, diabetes, on metformin, and basal-bolus insulin regimen, returning after a longer absence.  Last visit 7 months ago.  At that time, her sugars after dinner were higher due to grazing at night.  I did advise her to inject a small amount of insulin if she had a snack at night but she was not doing so.  At last visit, we increased insulin with dinner to cover her snacks.  We also decrease her Levemir a little to avoid low blood sugars in the morning. - since last visit, she did well, with only few hyperglycemic spikes, but overall improved CBGs, even after dinner - no change in regimen needed - I  advised her to:  Patient Instructions  Please continue: - Metformin 1000 mg 2x a day with meals - Levemir 30 units at night - Mealtime Novolog:  - 4-6 units before a  smaller meal - 8-10 units before a regular meal - 12-15 units before a large meal  - Sliding scale Novolog: - 151-175: + 1 unit  - 176-200: + 2 units  - 201-225: + 3 units  - 226-250: + 4 units  - >250: + 5 units If sugars before the meal are 60 or lower, please do not take the Novolog dose. If sugars before the meal are 61-80, take only half of the Novolog dose.  Please try Alpha lipoic acid 600 mg 2x a day.  Please return in 3 months with your sugar log.   - today, HbA1c is 6.3% (great!) - continue checking sugars at different times of the day - check 3x a day, rotating checks - advised for yearly eye exams >> she is UTD - Return to clinic in 3 mo with sugar log   2. HL -Now back on pravastatin without side effects -Reviewed latest lipid panel from 11/2016 >> high LDL as she was off pravastatin x 6 before this was drawn  3. PN - 2/2 DM, likely - continues B12, Cymbalta - will add ALA 600 mg 2x a day  Philemon Kingdom, MD PhD [facility] Endocrinology

## 2017-06-13 NOTE — Patient Instructions (Addendum)
Please continue: - Metformin 1000 mg 2x a day with meals - Levemir 30 units at night - Mealtime Novolog:  - 4-6 units before a smaller meal - 8-10 units before a regular meal - 12-15 units before a large meal  - Sliding scale Novolog: - 151-175: + 1 unit  - 176-200: + 2 units  - 201-225: + 3 units  - 226-250: + 4 units  - >250: + 5 units If sugars before the meal are 60 or lower, please do not take the Novolog dose. If sugars before the meal are 61-80, take only half of the Novolog dose.  Please try Alpha lipoic acid 600 mg 2x a day.  Please return in 3 months with your sugar log.

## 2017-06-14 ENCOUNTER — Ambulatory Visit: Payer: Medicare Other | Admitting: Internal Medicine

## 2017-07-06 ENCOUNTER — Ambulatory Visit: Payer: Medicare Other | Admitting: Rheumatology

## 2017-07-07 ENCOUNTER — Other Ambulatory Visit: Payer: Self-pay | Admitting: Physician Assistant

## 2017-07-09 NOTE — Telephone Encounter (Signed)
Last Visit: 04/04/17 Next visit: 10/04/17  Okay to refill Lorrin Mais?

## 2017-07-11 ENCOUNTER — Telehealth: Payer: Self-pay | Admitting: Rheumatology

## 2017-07-11 NOTE — Telephone Encounter (Signed)
Tye Maryland, pharmacist from [employer] left a voicemail regarding the controlled substance prescription written by Hazel Sams.  The DEA number written on the script is coming back as being invalid.    We need a correct DEA number or a different physician's signature.  Please call me back at [phone].

## 2017-07-11 NOTE — Telephone Encounter (Signed)
Contacted Priscilla [name] at the pharmacy and she states it was an insurance issue not the [place] number and it has been resolved.

## 2017-08-01 ENCOUNTER — Telehealth (INDEPENDENT_AMBULATORY_CARE_PROVIDER_SITE_OTHER): Payer: Self-pay | Admitting: Physical Medicine and Rehabilitation

## 2017-08-01 NOTE — Telephone Encounter (Signed)
If it is clearly the same pain on the left side and I think the repeat injection be okay otherwise anything different than I would OV

## 2017-08-01 NOTE — Telephone Encounter (Signed)
Patient states that pain is more on the right side. Scheduled for an OV.

## 2017-08-07 ENCOUNTER — Other Ambulatory Visit: Payer: Self-pay | Admitting: Family Medicine

## 2017-08-07 ENCOUNTER — Other Ambulatory Visit: Payer: Self-pay | Admitting: Internal Medicine

## 2017-08-08 ENCOUNTER — Other Ambulatory Visit: Payer: Self-pay | Admitting: Physician Assistant

## 2017-08-08 NOTE — Telephone Encounter (Signed)
Patient's cymbalta was increased to a total of 90 mg in January.patient instructed at that time to return in 4 weeks for follow up. Patient has not scheduled an appt. Called and left patient a message to call and schedule an office visit so her medication can be dosed correctly. Pharmacy only requested 30 mg. Unsure what patient is actually taking at this time.

## 2017-08-08 NOTE — Telephone Encounter (Signed)
Last Visit: 04/04/17 Next visit: 10/04/17  Okay to refill Lorrin Mais?

## 2017-08-09 MED ORDER — DULOXETINE HCL 60 MG PO CPEP: ORAL_CAPSULE | ORAL | 0 refills | Status: DC

## 2017-08-09 NOTE — Telephone Encounter (Signed)
Patient notified she has an appt schedule for 08/10/17

## 2017-08-09 NOTE — Telephone Encounter (Signed)
Refilled the 30 and 60 mg doses. Only 30 mg requested however she was on 60 mg first, so she would not only be taking the 30 mg, therefore this is likely a pharmacy error on the request. She is on a combined 30+60 dose.  I do want pt contacted to verify dose and follow up to be scheduled 6 mos from her last appt . No additional refills will be provided w/out appt.

## 2017-08-10 ENCOUNTER — Ambulatory Visit (INDEPENDENT_AMBULATORY_CARE_PROVIDER_SITE_OTHER): Payer: Medicare Other | Admitting: Family Medicine

## 2017-08-10 ENCOUNTER — Encounter: Payer: Self-pay | Admitting: Family Medicine

## 2017-08-10 VITALS — BP 124/79 | HR 64 | Temp 97.5°F | Ht 64.0 in | Wt 169.2 lb

## 2017-08-10 DIAGNOSIS — E785 Hyperlipidemia, unspecified: Secondary | ICD-10-CM

## 2017-08-10 DIAGNOSIS — IMO0002 Reserved for concepts with insufficient information to code with codable children: Secondary | ICD-10-CM

## 2017-08-10 DIAGNOSIS — E11319 Type 2 diabetes mellitus with unspecified diabetic retinopathy without macular edema: Secondary | ICD-10-CM

## 2017-08-10 DIAGNOSIS — F419 Anxiety disorder, unspecified: Secondary | ICD-10-CM

## 2017-08-10 DIAGNOSIS — Z794 Long term (current) use of insulin: Secondary | ICD-10-CM

## 2017-08-10 DIAGNOSIS — E1165 Type 2 diabetes mellitus with hyperglycemia: Secondary | ICD-10-CM

## 2017-08-10 DIAGNOSIS — F329 Major depressive disorder, single episode, unspecified: Secondary | ICD-10-CM | POA: Diagnosis not present

## 2017-08-10 DIAGNOSIS — I10 Essential (primary) hypertension: Secondary | ICD-10-CM | POA: Diagnosis not present

## 2017-08-10 MED ORDER — DULOXETINE HCL 60 MG PO CPEP: ORAL_CAPSULE | ORAL | 0 refills | Status: DC

## 2017-08-10 MED ORDER — LISINOPRIL 2.5 MG PO TABS: 2.5000 mg | ORAL_TABLET | Freq: Every day | ORAL | 1 refills | Status: DC

## 2017-08-10 MED ORDER — METOPROLOL TARTRATE 25 MG PO TABS: 25.0000 mg | ORAL_TABLET | Freq: Two times a day (BID) | ORAL | 1 refills | Status: DC

## 2017-08-10 MED ORDER — DULOXETINE HCL 30 MG PO CPEP: ORAL_CAPSULE | ORAL | 1 refills | Status: DC

## 2017-08-10 NOTE — Patient Instructions (Signed)
Make your medicare wellness for 1 year and day  from last, but at least after 11/23/2017.

## 2017-08-10 NOTE — Progress Notes (Signed)
Patient ID: Priscilla [name], female   DOB: 01/12/1951, 67 y.o.   MRN: [id number]      Patient ID: Priscilla [name], female  DOB: 1950-09-05, 67 y.o.   MRN: [id number]  Chief Complaint  Patient presents with  . Follow-up    Anxiety/depression    Subjective:  Priscilla [name] is a 67 y.o. female  Hypertension/lipidemia: Pt reports compliance with metoprolol BID and lisinopril 2.5 mg daily. Blood pressures ranges at home are not checked. Patient denies chest pain, shortness of breath or lower extremity edema. Pt takes a daily baby ASA. Pt is taking pravastatin. BMP: 10/13/2016 GFR NL CBC: 04/13/2017 within normal limits Lipids: 11/23/2016 total cholesterol 221, LDL 143, HDL 58, triglycerides 102 Diet: low sodium.  Exercise: routinely.  RF: HTN, HLD, DM, FHx HD   Peripheral neuropathy/anxiety: Patient reports she is doing well on the increased dose of Cymbalta.  She is taking  combined dose of 30+60 mg daily equals 90 mg daily.  She reports she feels this has helped a great deal with her anxiety and depression.  Past Medical History:  Diagnosis Date  . Anemia 12/14/2008  . Anxiety and depression 12/14/2008  . Asthma 11/12/2006  . CAD (coronary artery disease) 12/14/2008   hx transluminal coronary angioplasty  . Cataract   . Depression   . Diabetes mellitus with neuropathy ([place]) 12/14/2008  . Fibromyalgia 01/04/2007   sees rheuamtology  . 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 Hyperlipemia 01/21/2010  . Hypertension 12/14/2008  . Leg edema 12/14/2008  . 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 OSA (obstructive sleep apnea) 08/06/2008  . Osteoarthritis 12/14/2008  . Palpitations 11/12/2006   hx sinus tachy  . Psoriatic arthritis [facility])    sees rheumatology  . Rotator cuff injury    No Known Allergies Past Surgical History:  Procedure Laterality Date  . ABDOMINAL HYSTERECTOMY    . CHOLECYSTECTOMY    . LAPAROSCOPIC GASTRIC BANDING    . PTCA    . WRIST SURGERY     LEFT   Family History   Problem Relation Age of Onset  . Other Mother        ALS  . Arthritis Mother   . Stroke Father   . Alcohol abuse Father   . Diabetes Father   . Heart disease Father   . Early death Father 64  . Diabetes Sister   . Arthritis Sister   . Arthritis Maternal Aunt   . Diabetes Maternal Aunt   . Early death Maternal Aunt 72  . Diabetes Maternal Uncle   . Cancer Paternal Aunt   . COPD Paternal 38   . Heart disease Paternal Aunt   . Heart disease Paternal Uncle   . Alcohol abuse Paternal Grandfather   . Heart disease Paternal Grandfather   . Early death Paternal Grandfather 49  . Stroke Paternal Grandfather   . Hypertension Son   . Autoimmune disease Daughter    Social History   Socioeconomic History  . Marital status: Married    Spouse name: Not on file  . Number of children: Not on file  . Years of education: Not on file  . Highest education level: Not on file  Occupational History  . Not on file  Social Needs  . Financial resource strain: Not on file  . Food insecurity:    Worry: Not on file    Inability: Not on file  . Transportation needs:    Medical: Not on file  Non-medical: Not on file  Tobacco Use  . Smoking status: Never Smoker  . Smokeless tobacco: Never Used  Substance and Sexual Activity  . Alcohol use: Yes    Comment: glass of wine-specially occasion  . Drug use: No  . Sexual activity: Never    Birth control/protection: None  Lifestyle  . Physical activity:    Days per week: Not on file    Minutes per session: Not on file  . Stress: Not on file  Relationships  . Social connections:    Talks on phone: Not on file    Gets together: Not on file    Attends religious service: Not on file    Active member of club or organization: Not on file    Attends meetings of clubs or organizations: Not on file    Relationship status: Not on file  . Intimate partner violence:    Fear of current or ex partner: Not on file    Emotionally abused: Not on file      Physically abused: Not on file    Forced sexual activity: Not on file  Other Topics Concern  . Not on file  Social History Narrative   Married, [name]. 2 children name Abe People and Anderson Malta.   Some college, retired Engineer, manufacturing.   Drinks caffeine.   Take a daily vitamin.   Wears her seatbelt, exercises 3 times a week.   Smoke detector in the home.   Firearms in the home.   Feels safe in her relationships.   ROS: Negative, with the exception of above mentioned in HPI  Objective: BP 124/79 (BP Location: Left Arm, Patient Position: Sitting, Cuff Size: Normal)   Pulse 64   Temp (!) 97.5 F (36.4 C) (Oral)   Ht 5\' 4"  (1.626 m)   Wt 169 lb 3.2 oz (76.7 kg)   SpO2 97%   BMI 29.04 kg/m  Gen: Afebrile. No acute distress.  HENT: AT. [place]. . MMM.  Eyes:Pupils Equal Round Reactive to light, Extraocular movements intact,  Conjunctiva without redness, discharge or icterus. CV: RRR no murmur, trace edema, +2/4 P posterior tibialis pulses Chest: CTAB, no wheeze or crackles Skin: no rashes, purpura or petechiae.  Neuro:  Normal gait. PERLA. EOMi. Alert. Oriented x3  Assessment/plan: Priscilla [name] is a 67 y.o. female present for  Essential hypertension/palpitations/HLD -Stable.  To new metoprolol and lisinopril.  Refills provided today. -Continue pravastatin - low sodium. Routine exercise.  - BMP/CBC/lipid UTD.-->  Due next visit. - con't baby ASA - monitor for elevated pressures > 140/90 and return to clinic if routinely above goal.  - f/u 6 months.   Anxiety and depression/neuropathy Patient much improved on the increased dose of Cymbalta.  Continue a total of 90 mg daily of Cymbalta.  Refills provided today. -Follow-up 6 months  Uncontrolled type 2 diabetes mellitus with retinopathy, with long-term current use of insulin ([place]) Managed by endocrinology, last A1c looked great.    Return in about 6 months (around 02/10/2018) for HTN/ anxiety.   Electronically  signed by: Howard Pouch, DO [place]

## 2017-08-13 ENCOUNTER — Encounter: Payer: Self-pay | Admitting: Family Medicine

## 2017-08-21 ENCOUNTER — Ambulatory Visit (INDEPENDENT_AMBULATORY_CARE_PROVIDER_SITE_OTHER): Payer: Medicare Other | Admitting: Physical Medicine and Rehabilitation

## 2017-09-04 ENCOUNTER — Other Ambulatory Visit: Payer: Self-pay | Admitting: Physician Assistant

## 2017-09-04 ENCOUNTER — Other Ambulatory Visit: Payer: Self-pay | Admitting: Family Medicine

## 2017-09-04 DIAGNOSIS — I10 Essential (primary) hypertension: Secondary | ICD-10-CM

## 2017-09-04 NOTE — Telephone Encounter (Signed)
Last visit: 04/04/2017 Next visit: 10/04/2017 Last fill: 08/08/2017  Okay to refill Ambien?

## 2017-09-07 ENCOUNTER — Inpatient Hospital Stay (HOSPITAL_BASED_OUTPATIENT_CLINIC_OR_DEPARTMENT_OTHER): Payer: Medicare Other | Admitting: Family

## 2017-09-07 ENCOUNTER — Encounter: Payer: Self-pay | Admitting: Family

## 2017-09-07 ENCOUNTER — Inpatient Hospital Stay: Payer: Medicare Other | Attending: Hematology & Oncology

## 2017-09-07 ENCOUNTER — Other Ambulatory Visit: Payer: Self-pay

## 2017-09-07 VITALS — BP 127/81 | HR 84 | Temp 98.5°F | Resp 16 | Wt 168.0 lb

## 2017-09-07 DIAGNOSIS — Z9884 Bariatric surgery status: Secondary | ICD-10-CM | POA: Insufficient documentation

## 2017-09-07 DIAGNOSIS — Z79899 Other long term (current) drug therapy: Secondary | ICD-10-CM

## 2017-09-07 DIAGNOSIS — R0609 Other forms of dyspnea: Secondary | ICD-10-CM | POA: Insufficient documentation

## 2017-09-07 DIAGNOSIS — K912 Postsurgical malabsorption, not elsewhere classified: Secondary | ICD-10-CM | POA: Diagnosis not present

## 2017-09-07 DIAGNOSIS — Z7982 Long term (current) use of aspirin: Secondary | ICD-10-CM | POA: Insufficient documentation

## 2017-09-07 DIAGNOSIS — R5383 Other fatigue: Secondary | ICD-10-CM

## 2017-09-07 DIAGNOSIS — R531 Weakness: Secondary | ICD-10-CM | POA: Insufficient documentation

## 2017-09-07 DIAGNOSIS — D508 Other iron deficiency anemias: Secondary | ICD-10-CM

## 2017-09-07 DIAGNOSIS — E559 Vitamin D deficiency, unspecified: Secondary | ICD-10-CM

## 2017-09-07 DIAGNOSIS — Z794 Long term (current) use of insulin: Secondary | ICD-10-CM | POA: Insufficient documentation

## 2017-09-07 DIAGNOSIS — D510 Vitamin B12 deficiency anemia due to intrinsic factor deficiency: Secondary | ICD-10-CM

## 2017-09-07 DIAGNOSIS — K909 Intestinal malabsorption, unspecified: Secondary | ICD-10-CM

## 2017-09-07 LAB — CBC WITH DIFFERENTIAL (CANCER CENTER ONLY)
Basophils Absolute: 0.1 10*3/uL (ref 0.0–0.1)
Basophils Relative: 1 %
EOS PCT: 6 %
Eosinophils Absolute: 0.5 10*3/uL (ref 0.0–0.5)
HCT: 42.7 % (ref 34.8–46.6)
Hemoglobin: 13.8 g/dL (ref 11.6–15.9)
LYMPHS ABS: 2.6 10*3/uL (ref 0.9–3.3)
LYMPHS PCT: 34 %
MCH: 29.1 pg (ref 26.0–34.0)
MCHC: 32.3 g/dL (ref 32.0–36.0)
MCV: 89.9 fL (ref 81.0–101.0)
MONO ABS: 0.6 10*3/uL (ref 0.1–0.9)
MONOS PCT: 8 %
Neutro Abs: 4.1 10*3/uL (ref 1.5–6.5)
Neutrophils Relative %: 51 %
PLATELETS: 330 10*3/uL (ref 145–400)
RBC: 4.75 MIL/uL (ref 3.70–5.32)
RDW: 13.2 % (ref 11.1–15.7)
WBC Count: 7.8 10*3/uL (ref 3.9–10.0)

## 2017-09-07 LAB — VITAMIN B12: Vitamin B-12: 808 pg/mL (ref 180–914)

## 2017-09-07 NOTE — Progress Notes (Signed)
Hematology and Oncology Follow Up Visit  Priscilla [name] [id number] [birth year] 67 y.o. 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 She is symptomatic with fatigue, weakness and SOB with over exertion.  She has had no episodes of bleeding, no bruising or petechiae.  No fever, chills, n/v, cough, rash, dizziness, chest pain, palpitations, abdominal pain or changes in bowel or bladder habits.  No lymphadenopathy noted on exam.  No swelling or tenderness in her extremities. The neuropathy in her feet is unchanged.  She states that her blood sugars have been controlled.  She has maintained a good appetite and is staying well hydrated. Her weight is stable.  She is hoping to stated exercising some.   ECOG Performance Status: 1 - Symptomatic but completely ambulatory  Medications:  Allergies as of 09/07/2017   No Known Allergies     Medication List        Accurate as of 09/07/17  4:09 PM. Always use your most recent med list.          albuterol 108 (90 Base) MCG/ACT inhaler Commonly known as:  PROVENTIL HFA;VENTOLIN HFA Inhale 2 puffs into the lungs every 6 (six) hours as needed for wheezing or shortness of breath.   aspirin 81 MG tablet Take 81 mg by mouth daily.   Biotin 5000 MCG Tabs Take 5,000 mcg by mouth daily.   calcium-vitamin D 250-100 MG-UNIT tablet Take 1 tablet by mouth 2 (two) times daily.   cyclobenzaprine 5 MG tablet Commonly known as:  FLEXERIL Take 1 tablet (5 mg total) by mouth 2 (two) times daily as needed for muscle spasms.   DULoxetine 30 MG capsule Commonly known as:  CYMBALTA TAKE 1 CAPSULE BY MOUTH EVERY DAY   DULoxetine 60 MG capsule Commonly known as:  CYMBALTA TAKE 1 CAPSULE BY MOUTH EVERY DAY   FREESTYLE LITE test  strip Generic drug:  glucose blood USE TO TEST BLOOD SUGAR TWICE A DAY   glucagon 1 MG injection Commonly known as:  GLUCAGON EMERGENCY Inject 1 mg into the muscle once as needed.   insulin detemir 100 UNIT/ML injection Commonly known as:  LEVEMIR Inject 0.3 mLs (30 Units total) into the skin at bedtime.   Insulin Pen Needle 32G X 6 MM Misc Commonly known as:  NOVOFINE Use with levemir pen as instructed   Insulin Syringe-Needle U-100 27G X 5/8" 1 ML Misc Commonly known as:  B-D INS SYR MICROFINE 1CC/27G Use 4 times daily prior to meals and at bedtime   Insulin Syringe-Needle U-100 31G X 5/16" 0.3 ML Misc Commonly known as:  B-D INSULIN SYRINGE Use 1x a day   levalbuterol 45 MCG/ACT inhaler Commonly known as:  XOPENEX HFA Inhale 1-2 puffs into the lungs every 6 (six) hours as needed for wheezing.   lisinopril 2.5 MG tablet Commonly known as:  PRINIVIL,ZESTRIL Take 1 tablet (2.5 mg total) by mouth daily.   metFORMIN 1000 MG tablet Commonly known as:  GLUCOPHAGE TAKE 1 TABLET BY MOUTH 2 TIMES DAILY WITH A MEAL   metoprolol tartrate 25 MG tablet Commonly known as:  LOPRESSOR Take 1 tablet (25 mg total) by mouth 2 (two) times daily.   multivitamin tablet Take 1 tablet by mouth daily.   NOVOLOG FLEXPEN 100 UNIT/ML FlexPen Generic drug:  insulin aspart Inject 6-8 Units into the skin 3 times daily with meals. Plus sliding scale.   pravastatin 20 MG tablet Commonly known as:  PRAVACHOL Take 1 tablet (20 mg total) by mouth daily.   VITAMIN B12 PO Place under the tongue.   Vitamin D (Ergocalciferol) 50000 units Caps capsule Commonly known as:  DRISDOL Take 1 capsule (50,000 Units total) by mouth every 7 (seven) days.   zolpidem 5 MG tablet Commonly known as:  AMBIEN TAKE 1 TABLET BY MOUTH AT BEDTIME AS NEEDED FOR SLEEP       Allergies: No Known Allergies  Past Medical History, Surgical history, Social history, and Family History were reviewed and  updated.  Review of Systems: All other 10 point review of systems is negative.   Physical Exam:  vitals were not taken for this visit.   Wt Readings from Last 3 Encounters:  08/10/17 169 lb 3.2 oz (76.7 kg)  06/13/17 170 lb (77.1 kg)  04/25/17 170 lb (77.1 kg)    Ocular: Sclerae unicteric, pupils equal, round and reactive to light Ear-nose-throat: Oropharynx clear, dentition fair Lymphatic: No cervical, supraclavicular or axillary adenopathy Lungs no rales or rhonchi, good excursion bilaterally Heart regular rate and rhythm, no murmur appreciated Abd soft, nontender, positive bowel sounds, no liver or spleen tip palpated on exam, no fluid wave  MSK no focal spinal tenderness, no joint edema Neuro: non-focal, well-oriented, appropriate affect Breasts: Deferred   Lab Results  Component Value Date   WBC 7.8 09/07/2017   HGB 13.8 09/07/2017   HCT 42.7 09/07/2017   MCV 89.9 09/07/2017   PLT 330 09/07/2017   Lab Results  Component Value Date   FERRITIN 95 04/13/2017   IRON 65 04/13/2017   TIBC 291 04/13/2017   UIBC 226 04/13/2017   IRONPCTSAT 22 04/13/2017   Lab Results  Component Value Date   RBC 4.75 09/07/2017   No results found for: KPAFRELGTCHN, LAMBDASER, KAPLAMBRATIO No results found for: IGGSERUM, IGA, IGMSERUM No results found for: Odetta Pink, SPEI   Chemistry      Component Value Date/Time   NA 141 10/13/2016 1503   K 4.9 10/13/2016 1503   CL 103 10/13/2016 1503   CO2 28 10/13/2016 1503   BUN 16 10/13/2016 1503   CREATININE 0.90 10/13/2016 1503      Component Value Date/Time   CALCIUM 9.5 10/13/2016 1503   ALKPHOS 72 10/13/2016 1503   AST 14 10/13/2016 1503   ALT 20 10/13/2016 1503   BILITOT 0.2 10/13/2016 1503      Impression and Plan: Priscilla [name] is a very pleasant 55 to caucasian female with iron deficiency anemia secondary to malabsorption after gastric bypass. She is symptomatic with  fatigued, weakness and SOB with exertion.  We will see what her iron studies show and bring her back in for infusion.  Vit D is pending.  We will plan to see her back in another 4 months for follow-up.  She will contact our office with any questions or concerns. We can certainly see her sooner if need be.   Laverna Peace, NP 5/31/20194:09 PM

## 2017-09-08 LAB — VITAMIN D 25 HYDROXY (VIT D DEFICIENCY, FRACTURES): Vit D, 25-Hydroxy: 44.4 ng/mL (ref 30.0–100.0)

## 2017-09-10 LAB — IRON AND TIBC
Iron: 64 ug/dL (ref 41–142)
Saturation Ratios: 22 % (ref 21–57)
TIBC: 298 ug/dL (ref 236–444)
UIBC: 234 ug/dL

## 2017-09-10 LAB — FERRITIN: Ferritin: 59 ng/mL (ref 9–269)

## 2017-09-19 DIAGNOSIS — H2513 Age-related nuclear cataract, bilateral: Secondary | ICD-10-CM | POA: Diagnosis not present

## 2017-09-19 DIAGNOSIS — E119 Type 2 diabetes mellitus without complications: Secondary | ICD-10-CM | POA: Diagnosis not present

## 2017-09-19 DIAGNOSIS — H524 Presbyopia: Secondary | ICD-10-CM | POA: Diagnosis not present

## 2017-09-19 DIAGNOSIS — H5203 Hypermetropia, bilateral: Secondary | ICD-10-CM | POA: Diagnosis not present

## 2017-09-19 DIAGNOSIS — H40022 Open angle with borderline findings, high risk, left eye: Secondary | ICD-10-CM | POA: Diagnosis not present

## 2017-09-19 LAB — HM DIABETES EYE EXAM

## 2017-09-20 ENCOUNTER — Encounter: Payer: Self-pay | Admitting: *Deleted

## 2017-10-01 ENCOUNTER — Other Ambulatory Visit: Payer: Self-pay | Admitting: Family

## 2017-10-01 DIAGNOSIS — E559 Vitamin D deficiency, unspecified: Secondary | ICD-10-CM

## 2017-10-01 NOTE — Progress Notes (Signed)
Office Visit Note  Patient: Priscilla [name]             Date of Birth: 12-09-1950           MRN: [id number]             PCP: Ma Hillock, DO Referring: Ma Hillock, DO Visit Date: 10/04/2017 Occupation: @GUAROCC @    Subjective:  Hand stiffness.   History of Present Illness: Priscilla [name] is a 67 y.o. female with history of psoriatic arthritis, osteoarthritis and psoriasis.  She has not had active psoriatic arthritis in many years.  She states she continues to have some stiffness in her hands but no joint swelling.  She is also noticed a rash on the nape of her neck she is not sure if it psoriasis lesion.  She continues to have a stiffness and discomfort in her cervical spine.  She is not having much discomfort in the lower back.  She continues to have some fatigue and some generalized discomfort from fibromyalgia.  Activities of Daily Living:  Patient reports morning stiffness for 1 hour.   Patient Reports nocturnal pain.  Difficulty dressing/grooming: Denies Difficulty climbing stairs: Reports Difficulty getting out of chair: Denies Difficulty using hands for taps, buttons, cutlery, and/or writing: Reports   Review of Systems  Constitutional: Positive for fatigue. Negative for night sweats, weight gain and weight loss.  HENT: Negative for mouth sores, trouble swallowing, trouble swallowing, mouth dryness and nose dryness.   Eyes: Positive for dryness. Negative for pain, redness and visual disturbance.  Respiratory: Negative for cough, shortness of breath and difficulty breathing.   Cardiovascular: Negative for chest pain, palpitations, hypertension, irregular heartbeat and swelling in legs/feet.  Gastrointestinal: Negative for abdominal pain, blood in stool, constipation and diarrhea.  Endocrine: Negative for increased urination.  Genitourinary: Negative for pelvic pain and vaginal dryness.  Musculoskeletal: Positive for arthralgias, joint pain and morning  stiffness. Negative for joint swelling, myalgias, muscle weakness, muscle tenderness and myalgias.  Skin: Positive for rash. Negative for color change, hair loss, skin tightness, ulcers and sensitivity to sunlight.  Allergic/Immunologic: Negative for susceptible to infections.  Neurological: Positive for dizziness. Negative for light-headedness, headaches, memory loss, night sweats and weakness.  Hematological: Negative for bruising/bleeding tendency and swollen glands.  Psychiatric/Behavioral: Positive for depressed mood. Negative for confusion and sleep disturbance. The patient is nervous/anxious.     PMFS History:  Patient Active Problem List   Diagnosis Date Noted  . Vitamin D deficiency 10/13/2016  . 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 Iron deficiency anemia 04/28/2016  . Other insomnia 03/14/2016  . DDD (degenerative disc disease), cervical 03/14/2016  . Spondylosis without myelopathy or radiculopathy, lumbar region 01/31/2016  . Medicare annual wellness visit, initial 08/06/2015  . Uncontrolled type 2 diabetes mellitus with retinopathy, with long-term current use of insulin (Priscilla [name]) 05/18/2015  . Anxiety and depression 06/25/2014  . Psoriatic arthritis - sees rheumatologist 02/20/2014  . Asthma 12/14/2008  . PERIPHERAL NEUROPATHY 08/06/2008  . Hyperlipemia 11/12/2006  . Essential hypertension 11/12/2006    Past Medical History:  Diagnosis Date  . Anemia 12/14/2008  . Anxiety and depression 12/14/2008  . Asthma 11/12/2006  . CAD (coronary artery disease) 12/14/2008   hx transluminal coronary angioplasty  . Cataract   . Depression   . Diabetes mellitus with neuropathy ([place]) 12/14/2008  . Fibromyalgia 01/04/2007   sees rheuamtology  . 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  Hyperlipemia 01/21/2010  . Hypertension 12/14/2008  . Leg edema 12/14/2008  . 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 OSA (obstructive sleep apnea) 08/06/2008  .  Osteoarthritis 12/14/2008  . Palpitations 11/12/2006   hx sinus tachy  . Psoriatic arthritis [facility])    sees rheumatology  . Rotator cuff injury     Family History  Problem Relation Age of Onset  . Arthritis Mother   . Alzheimer's disease Mother   . Stroke Father   . Alcohol abuse Father   . Diabetes Father   . Heart disease Father   . Early death Father 63  . Diabetes Sister   . Arthritis Sister   . COPD Sister   . Arthritis Maternal Aunt   . Diabetes Maternal Aunt   . Early death Maternal Aunt 14  . Diabetes Maternal Uncle   . Cancer Paternal Aunt   . COPD Paternal 27   . Heart disease Paternal Aunt   . Heart disease Paternal Uncle   . Alcohol abuse Paternal Grandfather   . Heart disease Paternal Grandfather   . Early death Paternal Grandfather 36  . Stroke Paternal Grandfather   . Hypertension Son   . Autoimmune disease Daughter    Past Surgical History:  Procedure Laterality Date  . ABDOMINAL HYSTERECTOMY    . CHOLECYSTECTOMY    . LAPAROSCOPIC GASTRIC BANDING    . PTCA    . WRIST SURGERY     LEFT   Social History   Social History Narrative   Married, [name]. 2 children name Abe People and Anderson Malta.   Some college, retired Engineer, manufacturing.   Drinks caffeine.   Take a daily vitamin.   Wears her seatbelt, exercises 3 times a week.   Smoke detector in the home.   Firearms in the home.   Feels safe in her relationships.     Objective: Vital Signs: BP 115/74 (BP Location: Left Arm, Patient Position: Sitting, Cuff Size: Normal)   Pulse 92   Resp 14   Ht 5\' 4"  (1.626 m)   Wt 169 lb (76.7 kg)   BMI 29.01 kg/m    Physical Exam  Constitutional: She is oriented to person, place, and time. She appears well-developed and well-nourished.  HENT:  Head: Normocephalic and atraumatic.  Eyes: Conjunctivae and EOM are normal.  Neck: Normal range of motion.  Cardiovascular: Normal rate, regular rhythm, normal heart sounds and intact distal pulses.    Pulmonary/Chest: Effort normal and breath sounds normal.  Abdominal: Soft. Bowel sounds are normal.  Lymphadenopathy:    She has no cervical adenopathy.  Neurological: She is alert and oriented to person, place, and time.  Skin: Skin is warm and dry. Capillary refill takes less than 2 seconds. Rash noted.  Mild erythema noted at the base of her hairline.  No scaling was noted.  Psychiatric: She has a normal mood and affect. Her behavior is normal.  Nursing note and vitals reviewed.    Musculoskeletal Exam: C-spine some limitation with range of motion.  Thoracic spine limited range of motion.  Shoulder joints elbow joints wrist joints in good range of motion with no synovitis.  She has mild DIP PIP thickening.  Hip joints knee joints ankles MTPs PIPs were in good range of motion with no synovitis.  She has some generalized hyperalgesia.  CDAI Exam: No CDAI exam completed.    Investigation: No additional findings. CBC Latest Ref Rng & Units 09/07/2017 04/13/2017 01/12/2017  WBC 3.9 - 10.0 K/uL 7.8 8.7 8.7  Hemoglobin 11.6 - 15.9 g/dL 13.8 14.0 14.8  Hematocrit 34.8 - 46.6 % 42.7 42.4 43.8  Platelets 145 - 400 K/uL 330 316 321   CMP Latest Ref Rng & Units 10/13/2016 08/31/2016 04/25/2016  Glucose 65 - 99 mg/dL 110(H) 126(H) -  BUN 8 - 27 mg/dL 16 16 -  Creatinine 0.57 - 1.00 mg/dL 0.90 0.77 -  Sodium 134 - 144 mmol/L 141 136 -  Potassium 3.5 - 5.2 mmol/L 4.9 4.5 -  Chloride 96 - 106 mmol/L 103 100 -  CO2 20 - 29 mmol/L 28 29 -  Calcium 8.7 - 10.3 mg/dL 9.5 9.5 9.3  Total Protein 6.0 - 8.5 g/dL 6.8 7.5 -  Total Bilirubin 0.0 - 1.2 mg/dL 0.2 0.3 -  Alkaline Phos 39 - 117 IU/L 72 74 -  AST 0 - 40 IU/L 14 15 -  ALT 0 - 32 IU/L 20 14 -    Imaging: No results found.  Speciality Comments: No specialty comments available.    Procedures:  No procedures performed Allergies: Patient has no known allergies.   Assessment / Plan:     Visit Diagnoses: Psoriatic arthritis (HCC)-patient  has no active synovitis.  Her disease seems to be in remission.  Patient states several years ago she is to have sacroiliitis and left knee joint inflammatory arthritis.  Psoriasis-she has no active lesions.  She has a rash at the hairline which is improved after using shampoo.  Primary osteoarthritis of both hands-joint protection muscle strengthening discussed.  DDD (degenerative disc disease), cervical-she continues to have some stiffness and discomfort range of motion of her C-spine.  I offered a handout on C-spine exercises.  She states she does have exercises at home.  Spondylosis without myelopathy or radiculopathy, lumbar region-she is not having much discomfort in her lumbar region.  Other fatigue-fatigue is still a concern.  Need for regular exercise and good sleep hygiene was discussed.  She states her PCP has checked her TSH, vitamin D and B12 levels which have been normal.  She is on vitamin D supplement.  Fibromyalgia - Well controlled with Cymbalta, Flexeril, and Ambien.  She takes Flexeril occasionally about twice a month.  She takes Ambien at nighttime for insomnia.  Other insomnia - Ambien 5 mg as needed at bedtime for sleep.  Side effects of Ambien were revised.  History of vitamin D deficiency-she is on vitamin D supplement.  Other medical problems are listed as follows:  History of anemia  History of hypertension  History of diabetes mellitus  History of sleep apnea  History of depression  History of anxiety  History of asthma  History of carpal tunnel syndrome  History of scoliosis  Gastric bypass status for obesity    Orders: No orders of the defined types were placed in this encounter.  No orders of the defined types were placed in this encounter.   Face-to-face time spent with patient was 30 minutes. Greater than 50% of time was spent in counseling and coordination of care.  Follow-Up Instructions: Return in about 6 months (around 04/05/2018)  for Osteoarthritis, PSA.   Bo Merino, MD  Note - This record has been created using Editor, commissioning.  Chart creation errors have been sought, but may not always  have been located. Such creation errors do not reflect on  the standard of medical care.

## 2017-10-04 ENCOUNTER — Encounter: Payer: Self-pay | Admitting: Rheumatology

## 2017-10-04 ENCOUNTER — Ambulatory Visit (INDEPENDENT_AMBULATORY_CARE_PROVIDER_SITE_OTHER): Payer: Medicare Other | Admitting: Rheumatology

## 2017-10-04 VITALS — BP 115/74 | HR 92 | Resp 14 | Ht 64.0 in | Wt 169.0 lb

## 2017-10-04 DIAGNOSIS — M797 Fibromyalgia: Secondary | ICD-10-CM

## 2017-10-04 DIAGNOSIS — L4050 Arthropathic psoriasis, unspecified: Secondary | ICD-10-CM

## 2017-10-04 DIAGNOSIS — M47816 Spondylosis without myelopathy or radiculopathy, lumbar region: Secondary | ICD-10-CM | POA: Diagnosis not present

## 2017-10-04 DIAGNOSIS — Z8659 Personal history of other mental and behavioral disorders: Secondary | ICD-10-CM

## 2017-10-04 DIAGNOSIS — Z8679 Personal history of other diseases of the circulatory system: Secondary | ICD-10-CM

## 2017-10-04 DIAGNOSIS — Z862 Personal history of diseases of the blood and blood-forming organs and certain disorders involving the immune mechanism: Secondary | ICD-10-CM | POA: Diagnosis not present

## 2017-10-04 DIAGNOSIS — M19042 Primary osteoarthritis, left hand: Secondary | ICD-10-CM

## 2017-10-04 DIAGNOSIS — Z8639 Personal history of other endocrine, nutritional and metabolic disease: Secondary | ICD-10-CM

## 2017-10-04 DIAGNOSIS — R5383 Other fatigue: Secondary | ICD-10-CM | POA: Diagnosis not present

## 2017-10-04 DIAGNOSIS — Z8739 Personal history of other diseases of the musculoskeletal system and connective tissue: Secondary | ICD-10-CM

## 2017-10-04 DIAGNOSIS — M5030 Other cervical disc degeneration, unspecified cervical region: Secondary | ICD-10-CM | POA: Diagnosis not present

## 2017-10-04 DIAGNOSIS — G4709 Other insomnia: Secondary | ICD-10-CM | POA: Diagnosis not present

## 2017-10-04 DIAGNOSIS — L409 Psoriasis, unspecified: Secondary | ICD-10-CM | POA: Diagnosis not present

## 2017-10-04 DIAGNOSIS — M19041 Primary osteoarthritis, right hand: Secondary | ICD-10-CM

## 2017-10-04 DIAGNOSIS — Z8669 Personal history of other diseases of the nervous system and sense organs: Secondary | ICD-10-CM

## 2017-10-04 DIAGNOSIS — Z9884 Bariatric surgery status: Secondary | ICD-10-CM

## 2017-10-04 DIAGNOSIS — Z8709 Personal history of other diseases of the respiratory system: Secondary | ICD-10-CM

## 2017-10-08 ENCOUNTER — Encounter: Payer: Self-pay | Admitting: Family Medicine

## 2017-10-08 ENCOUNTER — Other Ambulatory Visit: Payer: Self-pay | Admitting: Physician Assistant

## 2017-10-08 ENCOUNTER — Ambulatory Visit (INDEPENDENT_AMBULATORY_CARE_PROVIDER_SITE_OTHER): Payer: Medicare Other | Admitting: Family Medicine

## 2017-10-08 VITALS — BP 114/60 | HR 64 | Temp 98.6°F | Resp 16 | Ht 64.0 in | Wt 166.5 lb

## 2017-10-08 DIAGNOSIS — I889 Nonspecific lymphadenitis, unspecified: Secondary | ICD-10-CM | POA: Diagnosis not present

## 2017-10-08 MED ORDER — CEPHALEXIN 500 MG PO CAPS: 500.0000 mg | ORAL_CAPSULE | Freq: Three times a day (TID) | ORAL | 0 refills | Status: DC

## 2017-10-08 NOTE — Telephone Encounter (Signed)
Last Visit: 10/04/17 Next Visit: 04/16/18  Okay to refill Ambien?

## 2017-10-08 NOTE — Progress Notes (Signed)
OFFICE VISIT  10/08/2017   CC:  Chief Complaint  Patient presents with  . Neck Pain    right side, lymph nodes swollen?, tender, fatigue    HPI:    Patient is a 67 y.o. Caucasian female with insulin dependent DM and psoriatic arthritis who presents for neck pain. Onset of soreness and slight focal swelling under R ear 2-3 d/a, then gradually started extending down lateral neck, felt tender.  Some swelling in the area diffusely. No earache, no fevers, no ST, no URI sx's. No change in her chronic fatigue. No neck strain or trauma.   Past Medical History:  Diagnosis Date  . Anemia 12/14/2008  . Anxiety and depression 12/14/2008  . Asthma 11/12/2006  . CAD (coronary artery disease) 12/14/2008   hx transluminal coronary angioplasty  . Cataract   . Depression   . Diabetes mellitus with neuropathy ([place]) 12/14/2008  . Fibromyalgia 01/04/2007   sees rheuamtology  . 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 Hyperlipemia 01/21/2010  . Hypertension 12/14/2008  . Leg edema 12/14/2008  . 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 OSA (obstructive sleep apnea) 08/06/2008  . Osteoarthritis 12/14/2008  . Palpitations 11/12/2006   hx sinus tachy  . Psoriatic arthritis [facility])    sees rheumatology  . Rotator cuff injury     Past Surgical History:  Procedure Laterality Date  . ABDOMINAL HYSTERECTOMY    . CHOLECYSTECTOMY    . LAPAROSCOPIC GASTRIC BANDING    . PTCA    . WRIST SURGERY     LEFT    Outpatient Medications Prior to Visit  Medication Sig Dispense Refill  . albuterol (PROVENTIL HFA;VENTOLIN HFA) 108 (90 Base) MCG/ACT inhaler Inhale 2 puffs into the lungs every 6 (six) hours as needed for wheezing or shortness of breath. 1 Inhaler 1  . aspirin 81 MG tablet Take 81 mg by mouth daily.      . Biotin 5000 MCG TABS Take 5,000 mcg by mouth daily.     . calcium-vitamin D 250-100 MG-UNIT per tablet Take 1 tablet by mouth daily.     . Cyanocobalamin (VITAMIN B12 PO) Place under the tongue.    .  cyclobenzaprine (FLEXERIL) 5 MG tablet Take 1 tablet (5 mg total) by mouth 2 (two) times daily as needed for muscle spasms. 20 tablet 0  . DULoxetine (CYMBALTA) 30 MG capsule TAKE 1 CAPSULE BY MOUTH EVERY DAY 90 capsule 1  . DULoxetine (CYMBALTA) 60 MG capsule TAKE 1 CAPSULE BY MOUTH EVERY DAY 90 capsule 0  . FREESTYLE LITE test strip USE TO TEST BLOOD SUGAR TWICE A DAY 350 each 4  . insulin detemir (LEVEMIR) 100 UNIT/ML injection Inject 0.3 mLs (30 Units total) into the skin at bedtime. 30 mL 3  . Insulin Pen Needle (NOVOFINE) 32G X 6 MM MISC Use with levemir pen as instructed 100 each 3  . Insulin Syringe-Needle U-100 (B-D INS SYR MICROFINE 1CC/27G) 27G X 5/8" 1 ML MISC Use 4 times daily prior to meals and at bedtime 120 each 6  . Insulin Syringe-Needle U-100 (B-D INSULIN SYRINGE) 31G X 5/16" 0.3 ML MISC Use 1x a day 100 each 3  . lisinopril (PRINIVIL,ZESTRIL) 2.5 MG tablet Take 1 tablet (2.5 mg total) by mouth daily. 90 tablet 1  . metFORMIN (GLUCOPHAGE) 1000 MG tablet TAKE 1 TABLET BY MOUTH 2 TIMES DAILY WITH A MEAL 60 tablet 2  . metoprolol tartrate (LOPRESSOR) 25 MG tablet Take 1 tablet (25 mg total) by mouth 2 (two)  times daily. 180 tablet 1  . Multiple Vitamin (MULTIVITAMIN) tablet Take 1 tablet by mouth daily.     Marland Kitchen NOVOLOG FLEXPEN 100 UNIT/ML FlexPen Inject 6-8 Units into the skin 3 times daily with meals. Plus sliding scale. 15 mL 2  . pravastatin (PRAVACHOL) 20 MG tablet Take 1 tablet (20 mg total) by mouth daily. 90 tablet 3  . Vitamin D, Ergocalciferol, (DRISDOL) 50000 units CAPS capsule TAKE 1 CAPSULE BY MOUTH every 7 DAYS 15 capsule 3  . zolpidem (AMBIEN) 5 MG tablet Take 1 tablet (5 mg total) by mouth at bedtime as needed for insomnia. 30 tablet 0  . glucagon (GLUCAGON EMERGENCY) 1 MG injection Inject 1 mg into the muscle once as needed. (Patient not taking: Reported on 08/10/2017) 1 each 12  . levalbuterol (XOPENEX HFA) 45 MCG/ACT inhaler Inhale 1-2 puffs into the lungs every 6 (six)  hours as needed for wheezing. (Patient not taking: Reported on 10/04/2017) 1 Inhaler 2   No facility-administered medications prior to visit.     No Known Allergies  ROS As per HPI  PE: Blood pressure 114/60, pulse 64, temperature 98.6 F (37 C), temperature source Oral, resp. rate 16, height 5\' 4"  (1.626 m), weight 166 lb 8 oz (75.5 kg), SpO2 97 %. Gen: Alert, well appearing.  Patient is oriented to person, place, time, and situation. AFFECT: pleasant, lucid thought and speech. ENT: Ears: EACs clear, normal epithelium.  TMs with good light reflex and landmarks bilaterally.  Eyes: no injection, icteris, swelling, or exudate.  EOMI, PERRLA. Nose: no drainage or turbinate edema/swelling.  No injection or focal lesion.  Mouth: lips without lesion/swelling.  Oral mucosa pink and moist.  Dentition intact and without obvious caries or gingival swelling.  Oropharynx without erythema, exudate, or swelling.  Neck: no nodule or mass palpable, but she has tenderness from just inferior to her left ear down lateral neck soft tissues.  No rash.   Scalp: small area of psoriasis on L aspect of vertex region.  LABS:    Chemistry      Component Value Date/Time   NA 141 10/13/2016 1503   K 4.9 10/13/2016 1503   CL 103 10/13/2016 1503   CO2 28 10/13/2016 1503   BUN 16 10/13/2016 1503   CREATININE 0.90 10/13/2016 1503      Component Value Date/Time   CALCIUM 9.5 10/13/2016 1503   ALKPHOS 72 10/13/2016 1503   AST 14 10/13/2016 1503   ALT 20 10/13/2016 1503   BILITOT 0.2 10/13/2016 1503       IMPRESSION AND PLAN:  Left sided neck pain: suspect mild cervical lymphadenitis. Less likely reactive lymphadenopathy from small psoriatic lesion on back of scalp lately. Reassured pt. Cephalexin 500mg  tid x 7d. Signs/symptoms to call or return for were reviewed and pt expressed understanding.  An After Visit Summary was printed and given to the patient.  FOLLOW UP: Return if symptoms worsen or fail  to improve.  Signed:  Crissie Sickles, MD           10/08/2017

## 2017-10-17 ENCOUNTER — Telehealth: Payer: Self-pay | Admitting: Internal Medicine

## 2017-10-17 ENCOUNTER — Encounter: Payer: Self-pay | Admitting: Internal Medicine

## 2017-10-17 ENCOUNTER — Ambulatory Visit (INDEPENDENT_AMBULATORY_CARE_PROVIDER_SITE_OTHER): Payer: Medicare Other | Admitting: Internal Medicine

## 2017-10-17 VITALS — BP 116/78 | HR 66 | Ht 64.0 in | Wt 168.0 lb

## 2017-10-17 DIAGNOSIS — E1142 Type 2 diabetes mellitus with diabetic polyneuropathy: Secondary | ICD-10-CM | POA: Diagnosis not present

## 2017-10-17 DIAGNOSIS — E1165 Type 2 diabetes mellitus with hyperglycemia: Secondary | ICD-10-CM

## 2017-10-17 DIAGNOSIS — Z794 Long term (current) use of insulin: Secondary | ICD-10-CM | POA: Diagnosis not present

## 2017-10-17 DIAGNOSIS — IMO0002 Reserved for concepts with insufficient information to code with codable children: Secondary | ICD-10-CM

## 2017-10-17 DIAGNOSIS — E785 Hyperlipidemia, unspecified: Secondary | ICD-10-CM

## 2017-10-17 DIAGNOSIS — E11319 Type 2 diabetes mellitus with unspecified diabetic retinopathy without macular edema: Secondary | ICD-10-CM | POA: Diagnosis not present

## 2017-10-17 LAB — POCT GLYCOSYLATED HEMOGLOBIN (HGB A1C): HEMOGLOBIN A1C: 6 % — AB (ref 4.0–5.6)

## 2017-10-17 NOTE — Progress Notes (Signed)
Patient ID: Priscilla [name], female   DOB: 1950-11-20, 67 y.o.   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 She previously saw Drs Dwyane Dee (distant past) and [place] (more recently). Last visit with me 4 months ago.  Last hemoglobin A1c was: Lab Results  Component Value Date   HGBA1C 6.3 06/13/2017   HGBA1C 5.9 11/24/2016   HGBA1C 6.5 (H) 04/22/2016  03/16/2017: HbA1c calculated from fructosamine: 5.6% 10/18/2015: HbA1c calculated from fructosamine: 5.7% 02/2015: HbA1c 7.4%, HbA1c calculated from fructosamine: 5.6%  She is on: - Metformin 1000 mg 2x a day with meals - Levemir 30 units at night - Mealtime Novolog:  -4-6 units before a smaller meal -8-10 units before a regular meal -12-14 units before a large meal  - Sliding scale Novolog: - 151-175: + 1 unit  - 176-200: + 2 units  - 201-225: + 3 units  - 226-250: + 4 units  - >250: + 5 units If sugars before the meal are 60 or lower, please do not take the Novolog dose. If sugars before the meal are 61-80, take only half of the Novolog dose. She was on Victoza 2.5 mg daily >> stopped as she could not afford this.  Pt checks her sugars 2-3 times a day (per review of her log): - am: 88-124, 132 >> 78-118, 133 >> 82-130 - 2h after b'fast: 105, 160 >> 96 >> 120, 185 - before lunch: 83-132 >> 79-119 >> 120-123 - 2h after lunch: 105-153 >> 111-130 >> 120-135 - before dinner:99-154, 187 >> 120-155, 171 >> 106-150 - 2h after dinner:126-235 >> 133-190, 220 >> 102-165 - bedtime: 105-231 >> 88-180 >> 106-160 - nighttime: n/c >> 46-70 end of June - birthday cake - overbolused Lowest sugar was 78 >> 46; she has hypoglycemia awareness  in the 80s. Highest sugar was 222 >> 285 x1 (cake).  Glucometer: Freestyle  -No CKD, last BUN/creatinine:  Lab Results  Component Value Date   BUN 16  10/13/2016   CREATININE 0.90 10/13/2016  ACR (12/2014): 6.9 On  lisinopril 1.25 mg daily. -+ HL; last set of lipids: Lab Results  Component Value Date   CHOL 221 (H) 11/23/2016   HDL 58 11/23/2016   LDLCALC 143 (H) 11/23/2016   TRIG 102 11/23/2016   CHOLHDL 3.8 11/23/2016  She was off pravastatin at the last lipid panel, now back on it. - last eye exam was in 09/2017: No DR. 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 + new floaters (scotopsias) - + Numbness and tingling in her feet. + B12.  Last TSH was normal: Lab Results  Component Value Date   TSH 2.08 04/25/2017   She has a history of lap band >> no large meals, but usually grazes, especially at night.    ROS: Constitutional: no weight gain/no weight loss, no fatigue, no subjective hyperthermia, no subjective hypothermia Eyes: no blurry vision, no xerophthalmia ENT: no sore throat, no nodules palpated in throat, no dysphagia, no odynophagia, no hoarseness Cardiovascular: no CP/no SOB/no palpitations/no leg swelling Respiratory: no cough/no SOB/no wheezing Gastrointestinal: no N/no V/no D/no C/no acid reflux Musculoskeletal: no muscle aches/no joint aches Skin: no rashes, no hair loss Neurological: no tremors/+ numbness/+ tingling/no dizziness  I reviewed pt's medications, allergies, PMH, social hx, family hx, and changes were documented in the history of  present illness. Otherwise, unchanged from my initial visit note.  Past Medical History:  Diagnosis Date  . Anemia 12/14/2008  . Anxiety and depression 12/14/2008  . Asthma 11/12/2006  . CAD (coronary artery disease) 12/14/2008   hx transluminal coronary angioplasty  . Cataract   . Depression   . Diabetes mellitus with neuropathy ([place]) 12/14/2008  . Fibromyalgia 01/04/2007   sees rheuamtology  . 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 Hyperlipemia 01/21/2010  . Hypertension 12/14/2008  . Leg edema 12/14/2008  .  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 OSA (obstructive sleep apnea) 08/06/2008  . Osteoarthritis 12/14/2008  . Palpitations 11/12/2006   hx sinus tachy  . Psoriatic arthritis [facility])    sees rheumatology  . Rotator cuff injury    Past Surgical History:  Procedure Laterality Date  . ABDOMINAL HYSTERECTOMY    . CHOLECYSTECTOMY    . LAPAROSCOPIC GASTRIC BANDING    . PTCA    . WRIST SURGERY     LEFT   Social History   Social History  . Marital Status: Married    Spouse Name: N/A  . Number of Children: N/A   Occupational History  . Not on file.   Social History Main Topics  . Smoking status: Never Smoker   . Smokeless tobacco: Never Used  . Alcohol Use: Yes     Comment: glass of wine-specially occasion  . Drug Use: No   Current Outpatient Medications on File Prior to Visit  Medication Sig Dispense Refill  . albuterol (PROVENTIL HFA;VENTOLIN HFA) 108 (90 Base) MCG/ACT inhaler Inhale 2 puffs into the lungs every 6 (six) hours as needed for wheezing or shortness of breath. 1 Inhaler 1  . aspirin 81 MG tablet Take 81 mg by mouth daily.      . Biotin 5000 MCG TABS Take 5,000 mcg by mouth daily.     . calcium-vitamin D 250-100 MG-UNIT per tablet Take 1 tablet by mouth daily.     . cephALEXin (KEFLEX) 500 MG capsule Take 1 capsule (500 mg total) by mouth 3 (three) times daily. 21 capsule 0  . Cyanocobalamin (VITAMIN B12 PO) Place under the tongue.    . cyclobenzaprine (FLEXERIL) 5 MG tablet Take 1 tablet (5 mg total) by mouth 2 (two) times daily as needed for muscle spasms. 20 tablet 0  . DULoxetine (CYMBALTA) 30 MG capsule TAKE 1 CAPSULE BY MOUTH EVERY DAY 90 capsule 1  . DULoxetine (CYMBALTA) 60 MG capsule TAKE 1 CAPSULE BY MOUTH EVERY DAY 90 capsule 0  . FREESTYLE LITE test strip USE TO TEST BLOOD SUGAR TWICE A DAY 350 each 4  . glucagon (GLUCAGON EMERGENCY) 1 MG injection Inject 1 mg into the muscle once as needed. (Patient not taking: Reported on 08/10/2017) 1 each 12  . insulin detemir  (LEVEMIR) 100 UNIT/ML injection Inject 0.3 mLs (30 Units total) into the skin at bedtime. 30 mL 3  . Insulin Pen Needle (NOVOFINE) 32G X 6 MM MISC Use with levemir pen as instructed 100 each 3  . Insulin Syringe-Needle U-100 (B-D INS SYR MICROFINE 1CC/27G) 27G X 5/8" 1 ML MISC Use 4 times daily prior to meals and at bedtime 120 each 6  . Insulin Syringe-Needle U-100 (B-D INSULIN SYRINGE) 31G X 5/16" 0.3 ML MISC Use 1x a day 100 each 3  . lisinopril (PRINIVIL,ZESTRIL) 2.5 MG tablet Take 1 tablet (2.5 mg total) by mouth daily. 90 tablet 1  . metFORMIN (GLUCOPHAGE) 1000 MG tablet TAKE 1 TABLET BY  MOUTH 2 TIMES DAILY WITH A MEAL 60 tablet 2  . metoprolol tartrate (LOPRESSOR) 25 MG tablet Take 1 tablet (25 mg total) by mouth 2 (two) times daily. 180 tablet 1  . Multiple Vitamin (MULTIVITAMIN) tablet Take 1 tablet by mouth daily.     Marland Kitchen NOVOLOG FLEXPEN 100 UNIT/ML FlexPen Inject 6-8 Units into the skin 3 times daily with meals. Plus sliding scale. 15 mL 2  . pravastatin (PRAVACHOL) 20 MG tablet Take 1 tablet (20 mg total) by mouth daily. 90 tablet 3  . Vitamin D, Ergocalciferol, (DRISDOL) 50000 units CAPS capsule TAKE 1 CAPSULE BY MOUTH every 7 DAYS 15 capsule 3  . zolpidem (AMBIEN) 5 MG tablet Take 1 tablet (5 mg total) by mouth at bedtime as needed for insomnia. 30 tablet 0   No current facility-administered medications on file prior to visit.    No Known Allergies Family History  Problem Relation Age of Onset  . Arthritis Mother   . Alzheimer's disease Mother   . Stroke Father   . Alcohol abuse Father   . Diabetes Father   . Heart disease Father   . Early death Father 36  . Diabetes Sister   . Arthritis Sister   . COPD Sister   . Arthritis Maternal Aunt   . Diabetes Maternal Aunt   . Early death Maternal Aunt 36  . Diabetes Maternal Uncle   . Cancer Paternal Aunt   . COPD Paternal 75   . Heart disease Paternal Aunt   . Heart disease Paternal Uncle   . Alcohol abuse Paternal  Grandfather   . Heart disease Paternal Grandfather   . Early death Paternal Grandfather 9  . Stroke Paternal Grandfather   . Hypertension Son   . Autoimmune disease Daughter    PE: BP 116/78 (BP Location: Right Arm, Patient Position: Sitting, Cuff Size: Normal)   Pulse 66   Ht 5\' 4"  (1.626 m)   Wt 168 lb (76.2 kg)   SpO2 99%   BMI 28.84 kg/m  Body mass index is 28.84 kg/m. Wt Readings from Last 3 Encounters:  10/17/17 168 lb (76.2 kg)  10/08/17 166 lb 8 oz (75.5 kg)  10/04/17 169 lb (76.7 kg)   Constitutional: overweight, in NAD Eyes: PERRLA, EOMI, no exophthalmos ENT: moist mucous membranes, no thyromegaly, no cervical lymphadenopathy Cardiovascular: RRR, No MRG Respiratory: CTA B Gastrointestinal: abdomen soft, NT, ND, BS+ Musculoskeletal: no deformities, strength intact in all 4 Skin: moist, warm, no rashes Neurological: no tremor with outstretched hands, DTR normal in all 4  ASSESSMENT: 1. DM2, insulin-dependent, uncontrolled, with complications - Diabetic retinopathy and left eye - Peripheral neuropathy  2. HL  3. PN   PLAN:  1. Patient with long-standing, uncontrolled, type 2 diabetes, on metformin and basal-bolus insulin regimen with great control at last visit as reflected in an HbA1c of 6.3%.  At that time her sugars were mostly at goal, with only few hyperglycemic spikes.  We did not change her regimen then. - At this visit, her sugars are mostly at goal, with the exception of hyperglycemia/followed by hypoglycemia for 3 days in the middle of June around the time of her granddaughters birthday.  At that time, she was preparing a cake and could not have all of her bolused for eating parts of it.  Since then, the lowest blood sugar has been in the 80s.  Also, highest blood sugar in the 180s. - She also brings a very good blood sugar log which we  reviewed together. - I congratulated her for her very good diabetes control and commitment - I advised her to:   Patient Instructions  Please continue: - Metformin 1000 mg 2x a day with meals - Levemir 30 units at night - Mealtime Novolog:  -4-6 units before a smaller meal -8-10 units before a regular meal -12-15 units before a large meal  - Sliding scale Novolog: - 151-175: + 1 unit  - 176-200: + 2 units  - 201-225: + 3 units  - 226-250: + 4 units  - >250: + 5 units If sugars before the meal are 60 or lower, please do not take the Novolog dose. If sugars before the meal are 61-80, take only half of the Novolog dose.   Please come back for a follow-up appointment in 4 months.  - today, HbA1c is 6.0% (lower) - continue checking sugars at different times of the day - check 3x a day, rotating checks - advised for yearly eye exams >> she is UTD - She needs annual labs but has an appointment with PCP later this month for an annual physical exam - Return to clinic in 4 mo with sugar log   2. HL - Reviewed latest lipid panel from 11/2016: High LDL as she was off pravastatin for 6 months before this was drawn - Continues now on pravastatin in without side effects.  3. PN -Most likely secondary to diabetes -continues B12 vitamin and Cymbalta -at last visit, I also suggested alpha lipoic acid 600 mg twice a day.    Philemon Kingdom, MD PhD [facility] Endocrinology

## 2017-10-17 NOTE — Patient Instructions (Addendum)
Please continue: - Metformin 1000 mg 2x a day with meals - Levemir 30 units at night - Mealtime Novolog:  -4-6 units before a smaller meal -8-10 units before a regular meal -12-15 units before a large meal  - Sliding scale Novolog: - 151-175: + 1 unit  - 176-200: + 2 units  - 201-225: + 3 units  - 226-250: + 4 units  - >250: + 5 units If sugars before the meal are 60 or lower, please do not take the Novolog dose. If sugars before the meal are 61-80, take only half of the Novolog dose.  Please come back for a follow-up appointment in 4 months. 

## 2017-10-17 NOTE — Telephone Encounter (Signed)
FREESTYLE LITE test strip AND Syringes and pen needles sent to the  pharmacy  [facility], [place] [address]    She would like her NOVOLOG sent into   [id number] - [place], [place] - [address] AT [place]     insulin detemir (LEVEMIR) 100 UNIT/ML injection  [address], [place] - [place], [place] - [address]

## 2017-10-19 DIAGNOSIS — H43813 Vitreous degeneration, bilateral: Secondary | ICD-10-CM | POA: Diagnosis not present

## 2017-10-19 MED ORDER — INSULIN SYRINGE-NEEDLE U-100 27G X 5/8" 1 ML MISC
6 refills | Status: DC
Start: 2017-10-19 — End: 2020-06-07

## 2017-10-19 MED ORDER — GLUCOSE BLOOD VI STRP: ORAL_STRIP | 4 refills | Status: DC

## 2017-10-19 MED ORDER — INSULIN ASPART 100 UNIT/ML FLEXPEN: PEN_INJECTOR | SUBCUTANEOUS | 2 refills | Status: DC

## 2017-10-19 MED ORDER — INSULIN PEN NEEDLE 32G X 6 MM MISC: 1 refills | Status: DC

## 2017-10-19 MED ORDER — INSULIN DETEMIR 100 UNIT/ML ~~LOC~~ SOLN: 30.0000 [IU] | Freq: Every day | SUBCUTANEOUS | 3 refills | Status: DC

## 2017-10-19 NOTE — Addendum Note (Signed)
Addended by: Drucilla Schmidt on: 10/19/2017 09:05 AM   Modules accepted: Orders

## 2017-10-19 NOTE — Telephone Encounter (Signed)
Levemir was sent to both pharamacys but all medications sent

## 2017-10-23 ENCOUNTER — Other Ambulatory Visit: Payer: Self-pay

## 2017-10-23 ENCOUNTER — Telehealth: Payer: Self-pay | Admitting: Internal Medicine

## 2017-10-23 MED ORDER — ONETOUCH LANCETS MISC: 1 refills | Status: DC

## 2017-10-23 MED ORDER — GLUCOSE BLOOD VI STRP: ORAL_STRIP | 0 refills | Status: DC

## 2017-10-23 NOTE — Telephone Encounter (Signed)
Resent medications per pts request for a new meter supplies be sent

## 2017-10-23 NOTE — Telephone Encounter (Signed)
[facility] ph# [phone] has questions about several RX's. Please call them at the above number.

## 2017-10-29 ENCOUNTER — Other Ambulatory Visit: Payer: Self-pay | Admitting: Internal Medicine

## 2017-10-29 DIAGNOSIS — H40022 Open angle with borderline findings, high risk, left eye: Secondary | ICD-10-CM | POA: Diagnosis not present

## 2017-10-29 DIAGNOSIS — H43812 Vitreous degeneration, left eye: Secondary | ICD-10-CM | POA: Diagnosis not present

## 2017-10-29 DIAGNOSIS — H2513 Age-related nuclear cataract, bilateral: Secondary | ICD-10-CM | POA: Diagnosis not present

## 2017-10-29 DIAGNOSIS — E119 Type 2 diabetes mellitus without complications: Secondary | ICD-10-CM | POA: Diagnosis not present

## 2017-10-30 DIAGNOSIS — H43813 Vitreous degeneration, bilateral: Secondary | ICD-10-CM | POA: Diagnosis not present

## 2017-10-30 DIAGNOSIS — E113293 Type 2 diabetes mellitus with mild nonproliferative diabetic retinopathy without macular edema, bilateral: Secondary | ICD-10-CM | POA: Diagnosis not present

## 2017-10-30 DIAGNOSIS — H2513 Age-related nuclear cataract, bilateral: Secondary | ICD-10-CM | POA: Diagnosis not present

## 2017-11-07 ENCOUNTER — Other Ambulatory Visit: Payer: Self-pay | Admitting: Physician Assistant

## 2017-11-07 NOTE — Telephone Encounter (Signed)
Last visit: 10/04/2017 Next visit: 04/16/2018  Last fill: 10/08/2017  Okay to refill Ambien?

## 2017-11-26 NOTE — Progress Notes (Addendum)
Subjective:   Priscilla [name] is a 67 y.o. female who presents for Medicare Annual (Subsequent) preventive examination.  Review of Systems:  No ROS.  Medicare Wellness Visit. Additional risk factors are reflected in the social history.  Cardiac Risk Factors include: advanced age (>73men, >63 women);diabetes mellitus;dyslipidemia;hypertension;family history of premature cardiovascular disease   Sleep patterns: Sleeps 7-8 hours, up to void x 3. Ambien nightly.  Home Safety/Smoke Alarms: Feels safe in home. Smoke alarms in place.  Living environment; residence and Firearm Safety: Lives with husband and sister in 1 story home. Rail at door.  Seat Belt Safety/Bike Helmet: Wears seat belt.   Female:   Pap-N/A      Mammo- 02/05/2017, BI-RADS CATEGORY  1: Negative.      Dexa scan-08/30/2015, normal. Declines testing at this time.       CCS-Colonoscopy 10/26/2008, polyps. Recall 10 years.      Objective:     Vitals: BP 110/60 (BP Location: Left Arm, Patient Position: Sitting, Cuff Size: Normal)   Pulse 77   Ht 5\' 4"  (1.626 m)   Wt 166 lb 4 oz (75.4 kg)   SpO2 98%   BMI 28.54 kg/m   Body mass index is 28.54 kg/m.  Advanced Directives 11/27/2017 04/13/2017 01/12/2017 01/08/2017 11/10/2016 10/13/2016 08/31/2016  Does Patient Have a Medical Advance Directive? No No No No No Yes Yes  Type of Advance Directive - - - - - Press photographer;Living will [place];Living will  Would patient like information on creating a medical advance directive? No - Patient declined No - Patient declined - No - Patient declined Yes (MAU/Ambulatory/Procedural Areas - Information given) - -    Tobacco Social History   Tobacco Use  Smoking Status Never Smoker  Smokeless Tobacco Never Used     Counseling given: Not Answered    Past Medical History:  Diagnosis Date  . Anemia 12/14/2008  . Anxiety and depression 12/14/2008  . Asthma 11/12/2006  . CAD (coronary artery disease)  12/14/2008   hx transluminal coronary angioplasty  . Cataract   . Depression   . Diabetes mellitus with neuropathy ([place]) 12/14/2008  . Fibromyalgia 01/04/2007   sees rheuamtology  . 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 Hyperlipemia 01/21/2010  . Hypertension 12/14/2008  . Leg edema 12/14/2008  . 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 OSA (obstructive sleep apnea) 08/06/2008  . Osteoarthritis 12/14/2008  . Palpitations 11/12/2006   hx sinus tachy  . Psoriatic arthritis [facility])    sees rheumatology  . Rotator cuff injury    Past Surgical History:  Procedure Laterality Date  . ABDOMINAL HYSTERECTOMY    . CHOLECYSTECTOMY    . LAPAROSCOPIC GASTRIC BANDING    . PTCA    . WRIST SURGERY     LEFT   Family History  Problem Relation Age of Onset  . Arthritis Mother   . Alzheimer's disease Mother   . Stroke Father   . Alcohol abuse Father   . Diabetes Father   . Heart disease Father   . Early death Father 11  . Diabetes Sister   . Arthritis Sister   . COPD Sister   . Arthritis Maternal Aunt   . Diabetes Maternal Aunt   . Early death Maternal Aunt 5  . Diabetes Maternal Uncle   . Cancer Paternal Aunt   . COPD Paternal 60   . Heart disease Paternal Aunt   . Heart disease Paternal Uncle   . Alcohol  abuse Paternal Grandfather   . Heart disease Paternal Grandfather   . Early death Paternal Grandfather 58  . Stroke Paternal Grandfather   . Hypertension Son   . Autoimmune disease Daughter    Social History   Socioeconomic History  . Marital status: Married    Spouse name: Not on file  . Number of children: Not on file  . Years of education: Not on file  . Highest education level: Not on file  Occupational History  . Not on file  Social Needs  . Financial resource strain: Not on file  . Food insecurity:    Worry: Not on file    Inability: Not on file  . Transportation needs:    Medical: Not on file    Non-medical: Not on file  Tobacco Use  . Smoking status: Never  Smoker  . Smokeless tobacco: Never Used  Substance and Sexual Activity  . Alcohol use: Yes    Comment: glass of wine-special occasion  . Drug use: Never  . Sexual activity: Never    Birth control/protection: None  Lifestyle  . Physical activity:    Days per week: Not on file    Minutes per session: Not on file  . Stress: Not on file  Relationships  . Social connections:    Talks on phone: Not on file    Gets together: Not on file    Attends religious service: Not on file    Active member of club or organization: Not on file    Attends meetings of clubs or organizations: Not on file    Relationship status: Not on file  Other Topics Concern  . Not on file  Social History Narrative   Married, [name]. 2 children name Abe People and Anderson Malta.   Some college, retired Engineer, manufacturing.   Drinks caffeine.   Take a daily vitamin.   Wears her seatbelt, exercises 3 times a week.   Smoke detector in the home.   Firearms in the home.   Feels safe in her relationships.    Outpatient Encounter Medications as of 11/27/2017  Medication Sig  . albuterol (PROVENTIL HFA;VENTOLIN HFA) 108 (90 Base) MCG/ACT inhaler Inhale 2 puffs into the lungs every 6 (six) hours as needed for wheezing or shortness of breath.  Marland Kitchen aspirin 81 MG tablet Take 81 mg by mouth daily.    . Biotin 5000 MCG TABS Take 5,000 mcg by mouth daily.   . calcium-vitamin D 250-100 MG-UNIT per tablet Take 1 tablet by mouth daily.   . Cyanocobalamin (VITAMIN B12 PO) Place under the tongue.  . cyclobenzaprine (FLEXERIL) 5 MG tablet Take 1 tablet (5 mg total) by mouth 2 (two) times daily as needed for muscle spasms.  . DULoxetine (CYMBALTA) 30 MG capsule TAKE 1 CAPSULE BY MOUTH EVERY DAY  . DULoxetine (CYMBALTA) 60 MG capsule TAKE 1 CAPSULE BY MOUTH EVERY DAY  . glucose blood (ONETOUCH VERIO) test strip USE TO TEST BLOOD SUGAR TWICE A DAY DX: E11.319  . insulin aspart (NOVOLOG FLEXPEN) 100 UNIT/ML FlexPen Inject 8-15 Units into  the skin 3 times daily with meals. Plus sliding scale. DX: E11.319  . insulin detemir (LEVEMIR) 100 UNIT/ML injection Inject 0.3 mLs (30 Units total) into the skin at bedtime. DX: E11.319  . Insulin Pen Needle 32G X 6 MM MISC Use to inject Novolog 3 times daily DX: E11.319  . Insulin Syringe-Needle U-100 (B-D INS SYR MICROFINE 1CC/27G) 27G X 5/8" 1 ML MISC Use to inject levemir once  daily DX: E11.319  . lisinopril (PRINIVIL,ZESTRIL) 2.5 MG tablet Take 1 tablet (2.5 mg total) by mouth daily.  . metFORMIN (GLUCOPHAGE) 1000 MG tablet TAKE 1 TABLET BY MOUTH 2 TIMES DAILY WITH A MEAL  . metoprolol tartrate (LOPRESSOR) 25 MG tablet Take 1 tablet (25 mg total) by mouth 2 (two) times daily.  . Multiple Vitamin (MULTIVITAMIN) tablet Take 1 tablet by mouth daily.   . ONE TOUCH LANCETS MISC USE TO TEST BLOOD SUGAR TWICE A DAY DX: E11.319  . pravastatin (PRAVACHOL) 20 MG tablet Take 1 tablet (20 mg total) by mouth daily.  . Vitamin D, Ergocalciferol, (DRISDOL) 50000 units CAPS capsule TAKE 1 CAPSULE BY MOUTH every 7 DAYS  . zolpidem (AMBIEN) 5 MG tablet TAKE 1 TABLET BY MOUTH NIGHTLY AT BEDTIME AS NEEDED FOR INSOMNIA  . glucagon (GLUCAGON EMERGENCY) 1 MG injection Inject 1 mg into the muscle once as needed. (Patient not taking: Reported on 08/10/2017)  . [DISCONTINUED] cephALEXin (KEFLEX) 500 MG capsule Take 1 capsule (500 mg total) by mouth 3 (three) times daily.   No facility-administered encounter medications on file as of 11/27/2017.     Activities of Daily Living In your present state of health, do you have any difficulty performing the following activities: 11/27/2017  Hearing? N  Vision? N  Difficulty concentrating or making decisions? N  Walking or climbing stairs? N  Dressing or bathing? N  Doing errands, shopping? N  Preparing Food and eating ? N  Using the Toilet? N  In the past six months, have you accidently leaked urine? N  Do you have problems with loss of bowel control? N  Managing your  Medications? N  Managing your Finances? N  Housekeeping or managing your Housekeeping? N  Some recent data might be hidden    Patient Care Team: Ma Hillock, DO as PCP - General (Family Medicine) Marlou Sa, Tonna Corner, MD as Consulting Physician (Orthopedic Surgery) Philemon Kingdom, MD as Consulting Physician (Internal Medicine) Mcarthur Rossetti, MD as Consulting Physician (Orthopedic Surgery) Bo Merino, MD as Consulting Physician (Rheumatology) Volanda Napoleon, MD as Consulting Physician (Oncology) Magnus Sinning, MD as Consulting Physician (Physical Medicine and Rehabilitation) Dermatology, [facility]    Assessment:   This is a routine wellness examination for [place].  Exercise Activities and Dietary recommendations Exercise limited by: None identified   Diet (meal preparation, eat out, water intake, caffeinated beverages, dairy products, fruits and vegetables): Drinks water and occasional diet soda.  Breakfast: oatmeal/nuts; coffee Lunch: sandwich; yogurt Dinner: protein and vegetables      Goals      Patient Stated   . Patient states.  (pt-stated)     Improve nutrition, mental health (increasing social mtgs with friends) and increasing activity.       Other   . Patient Stated     Increase social activities and drink more water.        Fall Risk Fall Risk  11/27/2017 01/08/2017 11/10/2016 10/30/2016 10/24/2016  Falls in the past year? No No Yes Yes No  Comment - - - Emmi Telephone Survey: data to providers prior to load -  Number falls in past yr: - - 1 1 -  Comment - - - Emmi Telephone Survey Actual Response = 1 -  Injury with Fall? - - No No -  Risk for fall due to : - - Impaired balance/gait - -  Follow up - - Falls prevention discussed - -    Depression Screen PHQ 2/9 Scores 11/27/2017  08/10/2017 04/25/2017 11/10/2016  PHQ - 2 Score 1 2 3 4   PHQ- 9 Score 4 8 7 4      Cognitive Function MMSE - Mini Mental State Exam 11/27/2017  Orientation to  time 5  Orientation to Place 5  Registration 3  Attention/ Calculation 5  Recall 3  Language- name 2 objects 2  Language- repeat 1  Language- follow 3 step command 3  Language- read & follow direction 1  Write a sentence 1  Copy design 1  Total score 30        Immunization History  Administered Date(s) Administered  . Influenza Split 01/21/2013  . Influenza, High Dose Seasonal PF 02/04/2015, 02/09/2016  . Influenza,inj,Quad PF,6+ Mos 01/15/2014  . Influenza-Unspecified 01/26/2017  . Pneumococcal Conjugate-13 02/04/2015  . Pneumococcal Polysaccharide-23 10/31/2013  . Tdap 02/07/2013    Screening Tests Health Maintenance  Topic Date Due  . INFLUENZA VACCINE  11/08/2017  . FOOT EXAM  11/10/2017  . HEMOGLOBIN A1C  04/19/2018  . OPHTHALMOLOGY EXAM  09/20/2018  . COLONOSCOPY  10/27/2018  . PNA vac Low Risk Adult (2 of 2 - PPSV23) 11/01/2018  . MAMMOGRAM  02/06/2019  . TETANUS/TDAP  02/08/2023  . Hepatitis C Screening  Completed  . DEXA SCAN  Addressed   Diabetic Foot Exam - Simple   Simple Foot Form Diabetic Foot exam was performed with the following findings:  Yes 11/27/2017  9:35 AM  Visual Inspection Sensation Testing Intact to touch and monofilament testing bilaterally:  Yes Pulse Check Posterior Tibialis and Dorsalis pulse intact bilaterally:  Yes Comments         Plan:     Schedule mammogram in November.   Continue doing brain stimulating activities (puzzles, reading, adult coloring books, staying active) to keep memory sharp.   I have personally reviewed and noted the following in the patient's chart:   . Medical and social history . Use of alcohol, tobacco or illicit drugs  . Current medications and supplements . Functional ability and status . Nutritional status . Physical activity . Advanced directives . List of other physicians . Hospitalizations, surgeries, and ER visits in previous 12 months . Vitals . Screenings to include cognitive,  depression, and falls . Referrals and appointments  In addition, I have reviewed and discussed with patient certain preventive protocols, quality metrics, and best practice recommendations. A written personalized care plan for preventive services as well as general preventive health recommendations were provided to patient.     Gerilyn Nestle, RN  11/27/2017  PCP Notes: -PHQ9=4 -Declines DEXA at this time.  -F/U with PCP 02/11/18, fasting.   Medical screening examination/treatment/procedure(s) were performed by non-physician practitioner and as supervising physician I was immediately available for consultation/collaboration.  I agree with above assessment and plan.  Electronically Signed by: Howard Pouch, DO [place] [facility] [place]

## 2017-11-27 ENCOUNTER — Other Ambulatory Visit: Payer: Self-pay

## 2017-11-27 ENCOUNTER — Ambulatory Visit: Payer: Medicare Other | Admitting: Family Medicine

## 2017-11-27 ENCOUNTER — Ambulatory Visit (INDEPENDENT_AMBULATORY_CARE_PROVIDER_SITE_OTHER): Payer: Medicare Other

## 2017-11-27 VITALS — BP 110/60 | HR 77 | Ht 64.0 in | Wt 166.2 lb

## 2017-11-27 DIAGNOSIS — Z794 Long term (current) use of insulin: Secondary | ICD-10-CM

## 2017-11-27 DIAGNOSIS — E11319 Type 2 diabetes mellitus with unspecified diabetic retinopathy without macular edema: Secondary | ICD-10-CM | POA: Diagnosis not present

## 2017-11-27 DIAGNOSIS — Z0000 Encounter for general adult medical examination without abnormal findings: Secondary | ICD-10-CM

## 2017-11-27 DIAGNOSIS — E1165 Type 2 diabetes mellitus with hyperglycemia: Secondary | ICD-10-CM

## 2017-11-27 DIAGNOSIS — IMO0002 Reserved for concepts with insufficient information to code with codable children: Secondary | ICD-10-CM

## 2017-11-27 NOTE — Patient Instructions (Addendum)
Schedule mammogram in November.   Continue doing brain stimulating activities (puzzles, reading, adult coloring books, staying active) to keep memory sharp.   Health Maintenance, Female Adopting a healthy lifestyle and getting preventive care can go a long way to promote health and wellness. Talk with your health care provider about what schedule of regular examinations is right for you. This is a good chance for you to check in with your provider about disease prevention and staying healthy. In between checkups, there are plenty of things you can do on your own. Experts have done a lot of research about which lifestyle changes and preventive measures are most likely to keep you healthy. Ask your health care provider for more information. Weight and diet Eat a healthy diet  Be sure to include plenty of vegetables, fruits, low-fat dairy products, and lean protein.  Do not eat a lot of foods high in solid fats, added sugars, or salt.  Get regular exercise. This is one of the most important things you can do for your health. ? Most adults should exercise for at least 150 minutes each week. The exercise should increase your heart rate and make you sweat (moderate-intensity exercise). ? Most adults should also do strengthening exercises at least twice a week. This is in addition to the moderate-intensity exercise.  Maintain a healthy weight  Body mass index (BMI) is a measurement that can be used to identify possible weight problems. It estimates body fat based on height and weight. Your health care provider can help determine your BMI and help you achieve or maintain a healthy weight.  For females 27 years of age and older: ? A BMI below 18.5 is considered underweight. ? A BMI of 18.5 to 24.9 is normal. ? A BMI of 25 to 29.9 is considered overweight. ? A BMI of 30 and above is considered obese.  Watch levels of cholesterol and blood lipids  You should start having your blood tested for  lipids and cholesterol at 68 years of age, then have this test every 5 years.  You may need to have your cholesterol levels checked more often if: ? Your lipid or cholesterol levels are high. ? You are older than 68 years of age. ? You are at high risk for heart disease.  Cancer screening Lung Cancer  Lung cancer screening is recommended for adults 26-56 years old who are at high risk for lung cancer because of a history of smoking.  A yearly low-dose CT scan of the lungs is recommended for people who: ? Currently smoke. ? Have quit within the past 15 years. ? Have at least a 30-pack-year history of smoking. A pack year is smoking an average of one pack of cigarettes a day for 1 year.  Yearly screening should continue until it has been 15 years since you quit.  Yearly screening should stop if you develop a health problem that would prevent you from having lung cancer treatment.  Breast Cancer  Practice breast self-awareness. This means understanding how your breasts normally appear and feel.  It also means doing regular breast self-exams. Let your health care provider know about any changes, no matter how small.  If you are in your 20s or 30s, you should have a clinical breast exam (CBE) by a health care provider every 1-3 years as part of a regular health exam.  If you are 18 or older, have a CBE every year. Also consider having a breast X-ray (mammogram) every year.  If  you have a family history of breast cancer, talk to your health care provider about genetic screening.  If you are at high risk for breast cancer, talk to your health care provider about having an MRI and a mammogram every year.  Breast cancer gene (BRCA) assessment is recommended for women who have family members with BRCA-related cancers. BRCA-related cancers include: ? Breast. ? Ovarian. ? Tubal. ? Peritoneal cancers.  Results of the assessment will determine the need for genetic counseling and BRCA1 and  BRCA2 testing.  Cervical Cancer Your health care provider may recommend that you be screened regularly for cancer of the pelvic organs (ovaries, uterus, and vagina). This screening involves a pelvic examination, including checking for microscopic changes to the surface of your cervix (Pap test). You may be encouraged to have this screening done every 3 years, beginning at age 64.  For women ages 44-65, health care providers may recommend pelvic exams and Pap testing every 3 years, or they may recommend the Pap and pelvic exam, combined with testing for human papilloma virus (HPV), every 5 years. Some types of HPV increase your risk of cervical cancer. Testing for HPV may also be done on women of any age with unclear Pap test results.  Other health care providers may not recommend any screening for nonpregnant women who are considered low risk for pelvic cancer and who do not have symptoms. Ask your health care provider if a screening pelvic exam is right for you.  If you have had past treatment for cervical cancer or a condition that could lead to cancer, you need Pap tests and screening for cancer for at least 20 years after your treatment. If Pap tests have been discontinued, your risk factors (such as having a new sexual partner) need to be reassessed to determine if screening should resume. Some women have medical problems that increase the chance of getting cervical cancer. In these cases, your health care provider may recommend more frequent screening and Pap tests.  Colorectal Cancer  This type of cancer can be detected and often prevented.  Routine colorectal cancer screening usually begins at 68 years of age and continues through 68 years of age.  Your health care provider may recommend screening at an earlier age if you have risk factors for colon cancer.  Your health care provider may also recommend using home test kits to check for hidden blood in the stool.  A small camera at the  end of a tube can be used to examine your colon directly (sigmoidoscopy or colonoscopy). This is done to check for the earliest forms of colorectal cancer.  Routine screening usually begins at age 39.  Direct examination of the colon should be repeated every 5-10 years through 68 years of age. However, you may need to be screened more often if early forms of precancerous polyps or small growths are found.  Skin Cancer  Check your skin from head to toe regularly.  Tell your health care provider about any new moles or changes in moles, especially if there is a change in a mole's shape or color.  Also tell your health care provider if you have a mole that is larger than the size of a pencil eraser.  Always use sunscreen. Apply sunscreen liberally and repeatedly throughout the day.  Protect yourself by wearing long sleeves, pants, a wide-brimmed hat, and sunglasses whenever you are outside.  Heart disease, diabetes, and high blood pressure  High blood pressure causes heart disease and increases  the risk of stroke. High blood pressure is more likely to develop in: ? People who have blood pressure in the high end of the normal range (130-139/85-89 mm Hg). ? People who are overweight or obese. ? People who are African American.  If you are 13-65 years of age, have your blood pressure checked every 3-5 years. If you are 22 years of age or older, have your blood pressure checked every year. You should have your blood pressure measured twice-once when you are at a hospital or clinic, and once when you are not at a hospital or clinic. Record the average of the two measurements. To check your blood pressure when you are not at a hospital or clinic, you can use: ? An automated blood pressure machine at a pharmacy. ? A home blood pressure monitor.  If you are between 68 years and 19 years old, ask your health care provider if you should take aspirin to prevent strokes.  Have regular diabetes  screenings. This involves taking a blood sample to check your fasting blood sugar level. ? If you are at a normal weight and have a low risk for diabetes, have this test once every three years after 68 years of age. ? If you are overweight and have a high risk for diabetes, consider being tested at a younger age or more often. Preventing infection Hepatitis B  If you have a higher risk for hepatitis B, you should be screened for this virus. You are considered at high risk for hepatitis B if: ? You were born in a country where hepatitis B is common. Ask your health care provider which countries are considered high risk. ? Your parents were born in a high-risk country, and you have not been immunized against hepatitis B (hepatitis B vaccine). ? You have HIV or AIDS. ? You use needles to inject street drugs. ? You live with someone who has hepatitis B. ? You have had sex with someone who has hepatitis B. ? You get hemodialysis treatment. ? You take certain medicines for conditions, including cancer, organ transplantation, and autoimmune conditions.  Hepatitis C  Blood testing is recommended for: ? Everyone born from [birth year] through 1966. ? Anyone with known risk factors for hepatitis C.  Sexually transmitted infections (STIs)  You should be screened for sexually transmitted infections (STIs) including gonorrhea and chlamydia if: ? You are sexually active and are younger than 68 years of age. ? You are older than 68 years of age and your health care provider tells you that you are at risk for this type of infection. ? Your sexual activity has changed since you were last screened and you are at an increased risk for chlamydia or gonorrhea. Ask your health care provider if you are at risk.  If you do not have HIV, but are at risk, it may be recommended that you take a prescription medicine daily to prevent HIV infection. This is called pre-exposure prophylaxis (PrEP). You are considered at risk  if: ? You are sexually active and do not regularly use condoms or know the HIV status of your partner(s). ? You take drugs by injection. ? You are sexually active with a partner who has HIV.  Talk with your health care provider about whether you are at high risk of being infected with HIV. If you choose to begin PrEP, you should first be tested for HIV. You should then be tested every 3 months for as long as you are taking PrEP.  Pregnancy  If you are premenopausal and you may become pregnant, ask your health care provider about preconception counseling.  If you may become pregnant, take 400 to 800 micrograms (mcg) of folic acid every day.  If you want to prevent pregnancy, talk to your health care provider about birth control (contraception). Osteoporosis and menopause  Osteoporosis is a disease in which the bones lose minerals and strength with aging. This can result in serious bone fractures. Your risk for osteoporosis can be identified using a bone density scan.  If you are 14 years of age or older, or if you are at risk for osteoporosis and fractures, ask your health care provider if you should be screened.  Ask your health care provider whether you should take a calcium or vitamin D supplement to lower your risk for osteoporosis.  Menopause may have certain physical symptoms and risks.  Hormone replacement therapy may reduce some of these symptoms and risks. Talk to your health care provider about whether hormone replacement therapy is right for you. Follow these instructions at home:  Schedule regular health, dental, and eye exams.  Stay current with your immunizations.  Do not use any tobacco products including cigarettes, chewing tobacco, or electronic cigarettes.  If you are pregnant, do not drink alcohol.  If you are breastfeeding, limit how much and how often you drink alcohol.  Limit alcohol intake to no more than 1 drink per day for nonpregnant women. One drink  equals 12 ounces of beer, 5 ounces of wine, or 1 ounces of hard liquor.  Do not use street drugs.  Do not share needles.  Ask your health care provider for help if you need support or information about quitting drugs.  Tell your health care provider if you often feel depressed.  Tell your health care provider if you have ever been abused or do not feel safe at home. This information is not intended to replace advice given to you by your health care provider. Make sure you discuss any questions you have with your health care provider. Document Released: 10/10/2010 Document Revised: 09/02/2015 Document Reviewed: 12/29/2014 Elsevier Interactive Patient Education  [employer].

## 2017-11-29 ENCOUNTER — Other Ambulatory Visit: Payer: Medicare Other

## 2017-12-03 ENCOUNTER — Other Ambulatory Visit: Payer: Self-pay | Admitting: Family Medicine

## 2017-12-03 ENCOUNTER — Other Ambulatory Visit: Payer: Self-pay | Admitting: Physician Assistant

## 2017-12-03 NOTE — Telephone Encounter (Signed)
Last visit: 10/04/2017 Next visit: 04/16/2018  Last fill: 11/07/2017  Okay to refill Ambien?

## 2018-01-07 ENCOUNTER — Ambulatory Visit: Payer: Medicare Other | Admitting: Hematology & Oncology

## 2018-01-07 ENCOUNTER — Other Ambulatory Visit: Payer: Self-pay | Admitting: Physician Assistant

## 2018-01-07 ENCOUNTER — Other Ambulatory Visit: Payer: Medicare Other

## 2018-01-07 NOTE — Telephone Encounter (Signed)
Last visit: 10/04/2017 Next visit: 04/16/2018  Last fill: 12/03/17  Okay to refill Ambien?

## 2018-01-08 ENCOUNTER — Inpatient Hospital Stay (HOSPITAL_BASED_OUTPATIENT_CLINIC_OR_DEPARTMENT_OTHER): Payer: Medicare Other | Admitting: Family

## 2018-01-08 ENCOUNTER — Other Ambulatory Visit: Payer: Self-pay | Admitting: Family Medicine

## 2018-01-08 ENCOUNTER — Inpatient Hospital Stay: Payer: Medicare Other | Attending: Hematology & Oncology

## 2018-01-08 ENCOUNTER — Other Ambulatory Visit: Payer: Self-pay

## 2018-01-08 ENCOUNTER — Encounter: Payer: Self-pay | Admitting: Family

## 2018-01-08 VITALS — BP 131/77 | HR 62 | Temp 98.2°F | Resp 19 | Wt 168.4 lb

## 2018-01-08 DIAGNOSIS — Z7982 Long term (current) use of aspirin: Secondary | ICD-10-CM | POA: Diagnosis not present

## 2018-01-08 DIAGNOSIS — G629 Polyneuropathy, unspecified: Secondary | ICD-10-CM | POA: Diagnosis not present

## 2018-01-08 DIAGNOSIS — R5383 Other fatigue: Secondary | ICD-10-CM | POA: Diagnosis not present

## 2018-01-08 DIAGNOSIS — Z1231 Encounter for screening mammogram for malignant neoplasm of breast: Secondary | ICD-10-CM

## 2018-01-08 DIAGNOSIS — K909 Intestinal malabsorption, unspecified: Secondary | ICD-10-CM | POA: Insufficient documentation

## 2018-01-08 DIAGNOSIS — Z6379 Other stressful life events affecting family and household: Secondary | ICD-10-CM | POA: Insufficient documentation

## 2018-01-08 DIAGNOSIS — Z9884 Bariatric surgery status: Secondary | ICD-10-CM

## 2018-01-08 DIAGNOSIS — D508 Other iron deficiency anemias: Secondary | ICD-10-CM | POA: Diagnosis not present

## 2018-01-08 DIAGNOSIS — Z79899 Other long term (current) drug therapy: Secondary | ICD-10-CM | POA: Diagnosis not present

## 2018-01-08 DIAGNOSIS — D510 Vitamin B12 deficiency anemia due to intrinsic factor deficiency: Secondary | ICD-10-CM

## 2018-01-08 DIAGNOSIS — E119 Type 2 diabetes mellitus without complications: Secondary | ICD-10-CM | POA: Diagnosis not present

## 2018-01-08 DIAGNOSIS — G4700 Insomnia, unspecified: Secondary | ICD-10-CM | POA: Diagnosis not present

## 2018-01-08 DIAGNOSIS — Z794 Long term (current) use of insulin: Secondary | ICD-10-CM

## 2018-01-08 DIAGNOSIS — E559 Vitamin D deficiency, unspecified: Secondary | ICD-10-CM

## 2018-01-08 LAB — CBC WITH DIFFERENTIAL (CANCER CENTER ONLY)
BASOS ABS: 0.1 10*3/uL (ref 0.0–0.1)
BASOS PCT: 1 %
EOS PCT: 6 %
Eosinophils Absolute: 0.4 10*3/uL (ref 0.0–0.5)
HEMATOCRIT: 42.2 % (ref 34.8–46.6)
Hemoglobin: 13.5 g/dL (ref 11.6–15.9)
Lymphocytes Relative: 29 %
Lymphs Abs: 2 10*3/uL (ref 0.9–3.3)
MCH: 29 pg (ref 26.0–34.0)
MCHC: 32 g/dL (ref 32.0–36.0)
MCV: 90.8 fL (ref 81.0–101.0)
MONO ABS: 0.6 10*3/uL (ref 0.1–0.9)
MONOS PCT: 8 %
NEUTROS ABS: 3.8 10*3/uL (ref 1.5–6.5)
Neutrophils Relative %: 56 %
Platelet Count: 330 10*3/uL (ref 145–400)
RBC: 4.65 MIL/uL (ref 3.70–5.32)
RDW: 13.1 % (ref 11.1–15.7)
WBC Count: 6.9 10*3/uL (ref 3.9–10.0)

## 2018-01-08 LAB — IRON AND TIBC
IRON: 75 ug/dL (ref 41–142)
Saturation Ratios: 24 % (ref 21–57)
TIBC: 309 ug/dL (ref 236–444)
UIBC: 234 ug/dL

## 2018-01-08 LAB — FERRITIN: Ferritin: 39 ng/mL (ref 11–307)

## 2018-01-08 LAB — VITAMIN B12: Vitamin B-12: 1044 pg/mL — ABNORMAL HIGH (ref 180–914)

## 2018-01-08 NOTE — Progress Notes (Signed)
Hematology and Oncology Follow Up Visit  Priscilla [name] [id number] May 10, 1950 67 y.o. 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 She is doing well but still has some intermittent fatigue. She is under some stress at home caring for her sister with "memory issues" and now her husband is starting to have the same problem.  She is taking her Vitamin D and B 12 supplements as prescribed.  She is resting well at night with Ambien.  No fever, chills, n/v, cough, rash, dizziness, SOB, chest pain, palpitations, abdominal pain or changes in bowel or bladder habits.  No swelling or tenderness in her extremities. The neuropathy in her feet is stable.  No falls or syncopal episodes to report.  No lymphadenopathy noted on exam.  No episodes of bleeding, no bruising or petechiae.  She has maintained a good appetite but admits that she needs to better hydrated.  She states that her blood sugars have been well controlled and her most recent Hgb A1c was 6.0.   ECOG Performance Status: 1 - Symptomatic but completely ambulatory  Medications:  Allergies as of 01/08/2018   No Known Allergies     Medication List        Accurate as of 01/08/18  9:22 AM. Always use your most recent med list.          albuterol 108 (90 Base) MCG/ACT inhaler Commonly known as:  PROVENTIL HFA;VENTOLIN HFA Inhale 2 puffs into the lungs every 6 (six) hours as needed for wheezing or shortness of breath.   aspirin 81 MG tablet Take 81 mg by mouth daily.   Biotin 5000 MCG Tabs Take 5,000 mcg by mouth daily.   calcium-vitamin D 250-100 MG-UNIT tablet Take 1 tablet by mouth daily.   cyclobenzaprine 5 MG tablet Commonly known as:  FLEXERIL Take 1 tablet (5 mg total) by mouth 2 (two) times daily as needed  for muscle spasms.   DULoxetine 30 MG capsule Commonly known as:  CYMBALTA TAKE 1 CAPSULE BY MOUTH EVERY DAY   DULoxetine 60 MG capsule Commonly known as:  CYMBALTA TAKE 1 CAPSULE BY MOUTH EVERY DAY   glucagon 1 MG injection Inject 1 mg into the muscle once as needed.   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 Plus sliding scale. 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   metFORMIN 1000 MG tablet Commonly known as:  GLUCOPHAGE TAKE 1 TABLET BY MOUTH 2 TIMES DAILY WITH A MEAL   metoprolol tartrate 25 MG tablet Commonly known as:  LOPRESSOR Take 1 tablet (25 mg total) by mouth 2 (two) times daily.   multivitamin tablet Take 1 tablet by mouth daily.  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   Vitamin D (Ergocalciferol) 50000 units Caps capsule Commonly known as:  DRISDOL TAKE 1 CAPSULE BY MOUTH every 7 DAYS   zolpidem 5 MG tablet Commonly known as:  AMBIEN TAKE 1 TABLET BY MOUTH NIGHTLY AT BEDTIME AS NEEDED FOR INSOMNIA.       Allergies: No Known Allergies  Past Medical History, Surgical history, Social history, and Family History were reviewed and updated.  Review of Systems: All other 10 point review of systems is negative.   Physical Exam:   weight is 168 lb 6.4 oz (76.4 kg). Her oral temperature is 98.2 F (36.8 C). Her blood pressure is 131/77 and her pulse is 62. Her respiration is 19 and oxygen saturation is 100%.   Wt Readings from Last 3 Encounters:  01/08/18 168 lb 6.4 oz (76.4 kg)  11/27/17 166 lb 4 oz (75.4 kg)  10/17/17 168 lb (76.2 kg)    Ocular: Sclerae unicteric, pupils equal, round and reactive to light Ear-nose-throat: Oropharynx clear, dentition fair Lymphatic: No cervical, supraclavicular or axillary adenopathy Lungs no rales or rhonchi, good excursion bilaterally Heart regular rate and rhythm, no murmur appreciated Abd soft, nontender, positive bowel sounds, no liver or spleen tip palpated on exam, no fluid wave  MSK no focal spinal tenderness, no joint edema Neuro: non-focal, well-oriented, appropriate affect Breasts: Deferred   Lab Results  Component Value Date   WBC 6.9 01/08/2018   HGB 13.5 01/08/2018   HCT 42.2 01/08/2018   MCV 90.8 01/08/2018   PLT 330 01/08/2018   Lab Results  Component Value Date   FERRITIN 59 09/07/2017   IRON 64 09/07/2017   TIBC 298 09/07/2017   UIBC 234 09/07/2017   IRONPCTSAT 22 09/07/2017   Lab Results  Component Value Date   RBC 4.65 01/08/2018   No results found for: KPAFRELGTCHN, LAMBDASER, KAPLAMBRATIO No results found for: IGGSERUM, IGA, IGMSERUM No results found for: Odetta Pink, SPEI   Chemistry      Component Value Date/Time   NA 141 10/13/2016 1503   K 4.9 10/13/2016 1503   CL 103 10/13/2016 1503   CO2 28 10/13/2016 1503   BUN 16 10/13/2016 1503   CREATININE 0.90 10/13/2016 1503      Component Value Date/Time   CALCIUM 9.5 10/13/2016 1503   ALKPHOS 72 10/13/2016 1503   AST 14 10/13/2016 1503   ALT 20 10/13/2016 1503   BILITOT 0.2 10/13/2016 1503      Impression and Plan: Priscilla [name] is a very pleasant 67 yo caucasian female with iron deficiency anemia secondary to malabsorption  after lap band surgery. She is doing well but still having some fatigue.  We will see what her iron studies show and bring her back in for infusion if needed.  She would like a 6 month follow-up which is fine.  She will contact our office with any questions or concerns. We can certainly see her sooner if need be.   Laverna Peace, NP 10/1/20199:22 AM

## 2018-01-09 LAB — VITAMIN D 25 HYDROXY (VIT D DEFICIENCY, FRACTURES): Vit D, 25-Hydroxy: 64.1 ng/mL (ref 30.0–100.0)

## 2018-01-23 DIAGNOSIS — D224 Melanocytic nevi of scalp and neck: Secondary | ICD-10-CM | POA: Diagnosis not present

## 2018-01-23 DIAGNOSIS — D225 Melanocytic nevi of trunk: Secondary | ICD-10-CM | POA: Diagnosis not present

## 2018-01-23 DIAGNOSIS — L821 Other seborrheic keratosis: Secondary | ICD-10-CM | POA: Diagnosis not present

## 2018-01-23 DIAGNOSIS — D2262 Melanocytic nevi of left upper limb, including shoulder: Secondary | ICD-10-CM | POA: Diagnosis not present

## 2018-01-23 DIAGNOSIS — L905 Scar conditions and fibrosis of skin: Secondary | ICD-10-CM | POA: Diagnosis not present

## 2018-01-23 DIAGNOSIS — D1801 Hemangioma of skin and subcutaneous tissue: Secondary | ICD-10-CM | POA: Diagnosis not present

## 2018-01-23 DIAGNOSIS — L918 Other hypertrophic disorders of the skin: Secondary | ICD-10-CM | POA: Diagnosis not present

## 2018-01-23 DIAGNOSIS — D2372 Other benign neoplasm of skin of left lower limb, including hip: Secondary | ICD-10-CM | POA: Diagnosis not present

## 2018-01-30 ENCOUNTER — Other Ambulatory Visit: Payer: Self-pay | Admitting: Family Medicine

## 2018-01-30 ENCOUNTER — Other Ambulatory Visit: Payer: Self-pay | Admitting: Internal Medicine

## 2018-01-30 NOTE — Telephone Encounter (Signed)
Please advise on refill request

## 2018-02-09 ENCOUNTER — Other Ambulatory Visit: Payer: Self-pay | Admitting: Physician Assistant

## 2018-02-11 ENCOUNTER — Ambulatory Visit (INDEPENDENT_AMBULATORY_CARE_PROVIDER_SITE_OTHER): Payer: Medicare Other | Admitting: Family Medicine

## 2018-02-11 ENCOUNTER — Encounter: Payer: Self-pay | Admitting: Family Medicine

## 2018-02-11 VITALS — BP 120/78 | HR 71 | Temp 98.3°F | Resp 20 | Ht 64.0 in | Wt 167.5 lb

## 2018-02-11 DIAGNOSIS — Z23 Encounter for immunization: Secondary | ICD-10-CM | POA: Diagnosis not present

## 2018-02-11 DIAGNOSIS — E785 Hyperlipidemia, unspecified: Secondary | ICD-10-CM

## 2018-02-11 DIAGNOSIS — E663 Overweight: Secondary | ICD-10-CM | POA: Insufficient documentation

## 2018-02-11 DIAGNOSIS — E1142 Type 2 diabetes mellitus with diabetic polyneuropathy: Secondary | ICD-10-CM | POA: Diagnosis not present

## 2018-02-11 DIAGNOSIS — I10 Essential (primary) hypertension: Secondary | ICD-10-CM | POA: Diagnosis not present

## 2018-02-11 DIAGNOSIS — F419 Anxiety disorder, unspecified: Secondary | ICD-10-CM

## 2018-02-11 DIAGNOSIS — F329 Major depressive disorder, single episode, unspecified: Secondary | ICD-10-CM

## 2018-02-11 LAB — LIPID PANEL
CHOLESTEROL: 187 mg/dL (ref 0–200)
HDL: 61.4 mg/dL (ref >39.00)
LDL Cholesterol: 107 mg/dL — ABNORMAL HIGH (ref 0–99)
NONHDL: 125.14
TRIGLYCERIDES: 92 mg/dL (ref 0.0–149.0)
Total CHOL/HDL Ratio: 3
VLDL: 18.4 mg/dL (ref 0.0–40.0)

## 2018-02-11 LAB — COMPREHENSIVE METABOLIC PANEL
ALBUMIN: 4.2 g/dL (ref 3.5–5.2)
ALK PHOS: 53 U/L (ref 39–117)
ALT: 13 U/L (ref 0–35)
AST: 14 U/L (ref 0–37)
BILIRUBIN TOTAL: 0.4 mg/dL (ref 0.2–1.2)
BUN: 15 mg/dL (ref 6–23)
CALCIUM: 9.4 mg/dL (ref 8.4–10.5)
CHLORIDE: 101 meq/L (ref 96–112)
CO2: 31 mEq/L (ref 19–32)
Creatinine, Ser: 0.74 mg/dL (ref 0.40–1.20)
GFR: 82.83 mL/min (ref >60.00)
Glucose, Bld: 93 mg/dL (ref 70–99)
POTASSIUM: 5 meq/L (ref 3.5–5.1)
Sodium: 139 mEq/L (ref 135–145)
Total Protein: 6.7 g/dL (ref 6.0–8.3)

## 2018-02-11 MED ORDER — ALBUTEROL SULFATE HFA 108 (90 BASE) MCG/ACT IN AERS: 2.0000 | INHALATION_SPRAY | Freq: Four times a day (QID) | RESPIRATORY_TRACT | 1 refills | Status: DC | PRN

## 2018-02-11 MED ORDER — LISINOPRIL 2.5 MG PO TABS: 2.5000 mg | ORAL_TABLET | Freq: Every day | ORAL | 1 refills | Status: DC

## 2018-02-11 MED ORDER — DULOXETINE HCL 30 MG PO CPEP: ORAL_CAPSULE | ORAL | 1 refills | Status: DC

## 2018-02-11 MED ORDER — METOPROLOL TARTRATE 25 MG PO TABS: 25.0000 mg | ORAL_TABLET | Freq: Two times a day (BID) | ORAL | 1 refills | Status: DC

## 2018-02-11 MED ORDER — PRAVASTATIN SODIUM 20 MG PO TABS: 20.0000 mg | ORAL_TABLET | Freq: Every day | ORAL | 3 refills | Status: DC

## 2018-02-11 MED ORDER — DULOXETINE HCL 60 MG PO CPEP: ORAL_CAPSULE | ORAL | 1 refills | Status: DC

## 2018-02-11 NOTE — Telephone Encounter (Signed)
Ok to refill 

## 2018-02-11 NOTE — Patient Instructions (Addendum)
It was great to see you today. We will call with lab results.  Your BP looks great. I have refilled your meds.  F/u 6 mos.    Please help [place] help you:  We are honored you have chosen [facility] for your [facility] home. Below you will find basic instructions that you may need to access in the future. Please help [place] help you by reading the instructions, which cover many of the frequent questions we experience.   Prescription refills and request:  -In order to allow more efficient response time, please call your pharmacy for all refills. They will forward the request electronically to [place]. This allows for the quickest possible response. Request left on a nurse line can take longer to refill, since these are checked as time allows between office patients and other phone calls.  - refill request can take up to 3-5 working days to complete.  - If request is sent electronically and request is appropiate, it is usually completed in 1-2 business days.  - all patients will need to be seen routinely for all chronic medical conditions requiring prescription medications (see follow-up below). If you are overdue for follow up on your condition, you will be asked to make an appointment and we will call in enough medication to cover you until your appointment (up to 30 days).  - all controlled substances will require a face to face visit to request/refill.  - if you desire your prescriptions to go through a new pharmacy, and have an active script at original pharmacy, you will need to call your pharmacy and have scripts transferred to new pharmacy. This is completed between the pharmacy locations and not by your provider.    Results: If any images or labs were ordered, it can take up to 1 week to get results depending on the test ordered and the lab/facility running and resulting the test. - Normal or stable results, which do not need further discussion, may be released to your mychart immediately with  attached note to you. A call may not be generated for normal results. Please make certain to sign up for mychart. If you have questions on how to activate your mychart you can call the front office.  - If your results need further discussion, our office will attempt to contact you via phone, and if unable to reach you after 2 attempts, we will release your abnormal result to your mychart with instructions.  - All results will be automatically released in mychart after 1 week.  - Your provider will provide you with explanation and instruction on all relevant material in your results. Please keep in mind, results and labs may appear confusing or abnormal to the untrained eye, but it does not mean they are actually abnormal for you personally. If you have any questions about your results that are not covered, or you desire more detailed explanation than what was provided, you should make an appointment with your provider to do so.   Our office handles many outgoing and incoming calls daily. If we have not contacted you within 1 week about your results, please check your mychart to see if there is a message first and if not, then contact our office.  In helping with this matter, you help decrease call volume, and therefore allow [place] to be able to respond to patients needs more efficiently.   Acute office visits (sick visit):  An acute visit is intended for a new problem and are  scheduled in shorter time slots to allow schedule openings for patients with new problems. This is the appropriate visit to discuss a new problem. Problems will not be addressed by phone call or Echart message. Appointment is needed if requesting treatment. In order to provide you with excellent quality medical care with proper time for you to explain your problem, have an exam and receive treatment with instructions, these appointments should be limited to one new problem per visit. If you experience a new problem, in which you desire to  be addressed, please make an acute office visit, we save openings on the schedule to accommodate you. Please do not save your new problem for any other type of visit, let us take care of it properly and quickly for you.   Follow up visits:  Depending on your condition(s) your provider will need to see you routinely in order to provide you with quality care and prescribe medication(s). Most chronic conditions (Example: hypertension, Diabetes, depression/anxiety... etc), require visits a couple times a year. Your provider will instruct you on proper follow up for your personal medical conditions and history. Please make certain to make follow up appointments for your condition as instructed. Failing to do so could result in lapse in your medication treatment/refills. If you request a refill, and are overdue to be seen on a condition, we will always provide you with a 30 day script (once) to allow you time to schedule.    Medicare wellness (well visit): - we have a wonderful Nurse Maudie Mercury), that will meet with you and provide you will yearly medicare wellness visits. These visits should occur yearly (can not be scheduled less than 1 calendar year apart) and cover preventive health, immunizations, advance directives and screenings you are entitled to yearly through your medicare benefits. Do not miss out on your entitled benefits, this is when medicare will pay for these benefits to be ordered for you.  These are strongly encouraged by your provider and is the appropriate type of visit to make certain you are up to date with all preventive health benefits. If you have not had your medicare wellness exam in the last 12 months, please make certain to schedule one by calling the office and schedule your medicare wellness with Maudie Mercury as soon as possible.   Yearly physical (well visit):  - Adults are recommended to be seen yearly for physicals. Check with your insurance and date of your last physical, most insurances  require one calendar year between physicals. Physicals include all preventive health topics, screenings, medical exam and labs that are appropriate for gender/age and history. You may have fasting labs needed at this visit. This is a well visit (not a sick visit), new problems should not be covered during this visit (see acute visit).  - Pediatric patients are seen more frequently when they are younger. Your provider will advise you on well child visit timing that is appropriate for your their age. - This is not a medicare wellness visit. Medicare wellness exams do not have an exam portion to the visit. Some medicare companies allow for a physical, some do not allow a yearly physical. If your medicare allows a yearly physical you can schedule the medicare wellness with our nurse Maudie Mercury and have your physical with your provider after, on the same day. Please check with insurance for your full benefits.   Late Policy/No Shows:  - all new patients should arrive 15-30 minutes earlier than appointment to allow [place] time  to  obtain all personal demographics,  insurance information and for you to complete office paperwork. - All established patients should arrive 10-15 minutes earlier than appointment time to update all information and be checked in .  - In our best efforts to run on time, if you are late for your appointment you will be asked to either reschedule or if able, we will work you back into the schedule. There will be a wait time to work you back in the schedule,  depending on availability.  - If you are unable to make it to your appointment as scheduled, please call 24 hours ahead of time to allow [place] to fill the time slot with someone else who needs to be seen. If you do not cancel your appointment ahead of time, you may be charged a no show fee.

## 2018-02-11 NOTE — Telephone Encounter (Signed)
Last visit: 10/04/2017 Next visit: 04/16/2018  Okay to refill Ambien? 

## 2018-02-11 NOTE — Progress Notes (Signed)
Patient ID: Priscilla [name], female   DOB: 08/19/1950, 67 y.o.   MRN: [id number]      Patient ID: Priscilla [name], female  DOB: [birth year], 67 y.o.   MRN: [id number] Patient Care Team    Relationship Specialty Notifications Start End  Ma Hillock, DO PCP - General Family Medicine  07/19/15    Comment: transfer to [place], Tonna Corner, MD Consulting Physician Orthopedic Surgery  08/06/15   Philemon Kingdom, MD Consulting Physician Internal Medicine  08/06/15    Comment: endocrine  Mcarthur Rossetti, MD Consulting Physician Orthopedic Surgery  08/06/15   Bo Merino, MD Consulting Physician Rheumatology  08/06/15   Volanda Napoleon, MD Consulting Physician Oncology  11/10/16   Magnus Sinning, MD Consulting Physician Physical Medicine and Rehabilitation  11/10/16   Dermatology, [facility]    11/10/16    Comment: Dr. [place]    Chief Complaint  Patient presents with  . Depression  . Hypertension  . Anxiety  . Hyperlipidemia    Subjective:  Priscilla [name] is a 67 y.o. female  Hypertension/hyperlipidemia: Pt reports compliance with metoprolol BID and lisinopril 2.5 mg daily. Blood pressures ranges at home are not checked. Patient denies chest pain, shortness of breath, dizziness or lower extremity edema.   Pt takes a daily baby ASA. Pt is taking pravastatin. BMP: 10/13/2016 GFR NL CBC: 01/08/2018 within normal limits Lipids: 11/23/2016 total cholesterol 221, LDL 143, HDL 58, triglycerides 102 Tsh: 04/25/2017 WNL Diet: low sodium.  Exercise: routinely.  RF: HTN, HLD, DM, FHx HD  Peripheral neuropathy/anxiety: Patient reports she is doing well on the increased dose of Cymbalta.  She is taking  combined dose of 30+60 mg daily equals 90 mg daily.  She does feel it is helping.   Depression screen [facility] 2/9 02/11/2018 11/27/2017 08/10/2017 04/25/2017 11/10/2016  Decreased Interest 0 0 1 0 2  Down, Depressed, Hopeless 1 1 1 3 2   PHQ - 2 Score 1 1 2 3 4   Altered sleeping 0 1 1 0 0   Tired, decreased energy 1 1 2 3  0  Change in appetite 0 1 1 1  0  Feeling bad or failure about yourself  0 0 1 0 0  Trouble concentrating 0 0 1 0 0  Moving slowly or fidgety/restless 0 0 0 0 0  Suicidal thoughts 0 0 0 0 0  PHQ-9 Score 2 4 8 7 4   Difficult doing work/chores Not difficult at all Not difficult at all Not difficult at all Somewhat difficult Not difficult at all  Some recent data might be hidden   GAD 7 : Generalized Anxiety Score 02/11/2018 08/10/2017 04/25/2017  Nervous, Anxious, on Edge 1 1 3   Control/stop worrying 1 1 3   Worry too much - different things 1 1 1   Trouble relaxing 0 1 0  Restless 0 1 0  Easily annoyed or irritable 1 0 0  Afraid - awful might happen 1 0 0  Total GAD 7 Score 5 5 7   Anxiety Difficulty Somewhat difficult Not difficult at all Not difficult at all    Past Medical History:  Diagnosis Date  . Anemia 12/14/2008  . Anxiety and depression 12/14/2008  . Asthma 11/12/2006  . CAD (coronary artery disease) 12/14/2008   hx transluminal coronary angioplasty  . Cataract   . Depression   . Diabetes mellitus with neuropathy ([place]) 12/14/2008  . Fibromyalgia 01/04/2007   sees rheuamtology  . 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 Hyperlipemia  01/21/2010  . Hypertension 12/14/2008  . Leg edema 12/14/2008  . 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 OSA (obstructive sleep apnea) 08/06/2008  . Osteoarthritis 12/14/2008  . Palpitations 11/12/2006   hx sinus tachy  . Psoriatic arthritis [facility])    sees rheumatology  . Rotator cuff injury    No Known Allergies Past Surgical History:  Procedure Laterality Date  . ABDOMINAL HYSTERECTOMY    . CHOLECYSTECTOMY    . LAPAROSCOPIC GASTRIC BANDING    . PTCA    . WRIST SURGERY     LEFT   Family History  Problem Relation Age of Onset  . Arthritis Mother   . Alzheimer's disease Mother   . Stroke Father   . Alcohol abuse Father   . Diabetes Father   . Heart disease Father   . Early death Father 7  . Diabetes Sister     . Arthritis Sister   . COPD Sister   . Arthritis Maternal Aunt   . Diabetes Maternal Aunt   . Early death Maternal Aunt 59  . Diabetes Maternal Uncle   . Cancer Paternal Aunt   . COPD Paternal 85   . Heart disease Paternal Aunt   . Heart disease Paternal Uncle   . Alcohol abuse Paternal Grandfather   . Heart disease Paternal Grandfather   . Early death Paternal Grandfather 58  . Stroke Paternal Grandfather   . Hypertension Son   . Autoimmune disease Daughter    Social History   Socioeconomic History  . Marital status: Married    Spouse name: Not on file  . Number of children: Not on file  . Years of education: Not on file  . Highest education level: Not on file  Occupational History  . Not on file  Social Needs  . Financial resource strain: Not on file  . Food insecurity:    Worry: Not on file    Inability: Not on file  . Transportation needs:    Medical: Not on file    Non-medical: Not on file  Tobacco Use  . Smoking status: Never Smoker  . Smokeless tobacco: Never Used  Substance and Sexual Activity  . Alcohol use: Yes    Comment: glass of wine-special occasion  . Drug use: Never  . Sexual activity: Never    Birth control/protection: None  Lifestyle  . Physical activity:    Days per week: Not on file    Minutes per session: Not on file  . Stress: Not on file  Relationships  . Social connections:    Talks on phone: Not on file    Gets together: Not on file    Attends religious service: Not on file    Active member of club or organization: Not on file    Attends meetings of clubs or organizations: Not on file    Relationship status: Not on file  . Intimate partner violence:    Fear of current or ex partner: Not on file    Emotionally abused: Not on file    Physically abused: Not on file    Forced sexual activity: Not on file  Other Topics Concern  . Not on file  Social History Narrative   Married, [name]. 2 children name Abe People and Anderson [place].   Some  college, retired Engineer, manufacturing.   Drinks caffeine.   Take a daily vitamin.   Wears her seatbelt, exercises 3 times a week.   Smoke detector in the home.   Firearms in the  home.   Feels safe in her relationships.   ROS: Negative, with the exception of above mentioned in HPI  Objective: BP 120/78 (BP Location: Left Arm, Patient Position: Sitting, Cuff Size: Normal)   Pulse 71   Temp 98.3 F (36.8 C)   Resp 20   Ht 5\' 4"  (1.626 m)   Wt 167 lb 8 oz (76 kg)   SpO2 98%   BMI 28.75 kg/m  Gen: Afebrile. No acute distress. Nontoxic, pleasant overweight female.  HENT: AT. [place].  MMM.  Eyes:Pupils Equal Round Reactive to light, Extraocular movements intact,  Conjunctiva without redness, discharge or icterus. Neck/lymp/endocrine: Supple,no lymphadenopathy, no thyromegaly CV: RRR no murmur, no edema, +2/4 P posterior tibialis pulses Chest: CTAB, no wheeze or crackles Abd: Soft. overweight. NTND. BS present. no Masses palpated.  Skin: no rashes, purpura or petechiae.  Neuro:  Normal gait. PERLA. EOMi. Alert. Oriented x3 Psych: Normal affect, dress and demeanor. Normal speech. Normal thought content and judgment.  Assessment/plan: Priscilla [name] is a 67 y.o. female present for  Essential hypertension/palpitations/HLD - Stable.  - BP looks great. Continue/refilled Pravastatin, Metoprolol and lisinopril.  - CMP and lipids collected today.  - Continue pravastatin - low sodium. Routine exercise.  - updated labs today.   - con't baby ASA - monitor for elevated pressures > 140/90 and return to clinic if routinely above goal.  - f/u 6 months.   Anxiety and depression/neuropathy - stable.   Continue a total of 90 mg daily of Cymbalta.  Refills provided today. -Follow-up 6 months   type 2 diabetes mellitus with retinopathy, with long-term current use of insulin ([place]) Managed by endocrinology, last A1c looked great at 6.0.  Immunization due - Flu vaccine HIGH DOSE PF  (Fluzone High dose)    Return in about 6 months (around 08/12/2018) for HTN/HLD/anxiety.   Electronically signed by: Howard Pouch, DO [place]

## 2018-02-14 ENCOUNTER — Ambulatory Visit
Admission: RE | Admit: 2018-02-14 | Discharge: 2018-02-14 | Disposition: A | Discharge status: Home / Self Care | Payer: Medicare Other | Source: Ambulatory Visit | Attending: Family Medicine | Admitting: Family Medicine

## 2018-02-14 DIAGNOSIS — Z1231 Encounter for screening mammogram for malignant neoplasm of breast: Secondary | ICD-10-CM | POA: Diagnosis not present

## 2018-02-18 ENCOUNTER — Ambulatory Visit: Payer: Medicare Other | Admitting: Internal Medicine

## 2018-02-27 ENCOUNTER — Other Ambulatory Visit: Payer: Self-pay | Admitting: Family Medicine

## 2018-02-27 DIAGNOSIS — I10 Essential (primary) hypertension: Secondary | ICD-10-CM

## 2018-02-27 NOTE — Telephone Encounter (Signed)
Received refill request for pts lisinopril. This was prescribed for 90 d with a refill at her appt on 02/11/2018. She should not need this refills. Please make sure it wen to pharmacy and pt using appropriate script #.

## 2018-03-08 ENCOUNTER — Other Ambulatory Visit: Payer: Self-pay | Admitting: Endocrinology

## 2018-03-15 ENCOUNTER — Other Ambulatory Visit: Payer: Self-pay | Admitting: Physician Assistant

## 2018-03-15 NOTE — Telephone Encounter (Signed)
Last visit: 10/04/2017 Next visit: 04/16/2018  Okay to refill Ambien?

## 2018-03-28 ENCOUNTER — Other Ambulatory Visit: Payer: Self-pay | Admitting: Family Medicine

## 2018-03-28 DIAGNOSIS — F329 Major depressive disorder, single episode, unspecified: Secondary | ICD-10-CM

## 2018-03-28 DIAGNOSIS — F419 Anxiety disorder, unspecified: Principal | ICD-10-CM

## 2018-04-04 NOTE — Progress Notes (Signed)
Office Visit Note  Patient: Priscilla [name]             Date of Birth: [birth year]           MRN: [id number]             PCP: Ma Hillock, DO Referring: Ma Hillock, DO Visit Date: 04/16/2018 Occupation: @GUAROCC @  Subjective:  Pain in both hands.    History of Present Illness: Priscilla [name] is a 67 y.o. female with history of psoriatic arthritis osteoarthritis, degenerative disc disease and fibromyalgia syndrome.  She states she continues to have some discomfort in her hands.  She notices intermittent swelling in her hands and knee joints.  She denies any recent psoriasis.  Is been having increased pain from fibromyalgia.  She has been under a lot of stress.  She states she has been having some spasm in the trapezius area in the costochondral region.  Activities of Daily Living:  Patient reports morning stiffness for 30 minutes.   Patient Reports nocturnal pain.  Difficulty dressing/grooming: Denies Difficulty climbing stairs: Reports Difficulty getting out of chair: Denies Difficulty using hands for taps, buttons, cutlery, and/or writing: Reports  Review of Systems  Constitutional: Positive for fatigue. Negative for night sweats, weight gain and weight loss.  HENT: Positive for mouth dryness. Negative for mouth sores, trouble swallowing, trouble swallowing and nose dryness.   Eyes: Positive for dryness. Negative for pain, redness, itching and visual disturbance.  Respiratory: Negative for cough, shortness of breath, wheezing and difficulty breathing.   Cardiovascular: Negative for chest pain, palpitations, hypertension, irregular heartbeat and swelling in legs/feet.  Gastrointestinal: Negative for abdominal pain, blood in stool, constipation and diarrhea.  Endocrine: Negative for increased urination.  Genitourinary: Negative for painful urination, nocturia, pelvic pain and vaginal dryness.  Musculoskeletal: Positive for arthralgias, joint pain and morning stiffness.  Negative for joint swelling, myalgias, muscle weakness, muscle tenderness and myalgias.  Skin: Positive for hair loss. Negative for color change, rash, skin tightness, ulcers and sensitivity to sunlight.  Allergic/Immunologic: Negative for susceptible to infections.  Neurological: Positive for dizziness and weakness. Negative for light-headedness, headaches, memory loss and night sweats.  Hematological: Negative for bruising/bleeding tendency and swollen glands.  Psychiatric/Behavioral: Negative for depressed mood, confusion and sleep disturbance. The patient is not nervous/anxious.     PMFS History:  Patient Active Problem List   Diagnosis Date Noted  . Overweight (BMI 25.0-29.9) 02/11/2018  . Vitamin D deficiency 10/13/2016  . 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 Iron deficiency anemia 04/28/2016  . Other insomnia 03/14/2016  . DDD (degenerative disc disease), cervical 03/14/2016  . Spondylosis without myelopathy or radiculopathy, lumbar region 01/31/2016  . Medicare annual wellness visit, initial 08/06/2015  . Anxiety and depression 06/25/2014  . Psoriatic arthritis - sees rheumatologist 02/20/2014  . Asthma 12/14/2008  . Diabetic peripheral neuropathy associated with type 2 diabetes mellitus ([place]) 08/06/2008  . Hyperlipemia 11/12/2006  . Essential hypertension 11/12/2006    Past Medical History:  Diagnosis Date  . Anemia 12/14/2008  . Anxiety and depression 12/14/2008  . Asthma 11/12/2006  . CAD (coronary artery disease) 12/14/2008   hx transluminal coronary angioplasty  . Cataract   . Depression   . Diabetes mellitus with neuropathy ([place]) 12/14/2008  . Fibromyalgia 01/04/2007   sees rheuamtology  . 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 Hyperlipemia 01/21/2010  . Hypertension 12/14/2008  . Leg edema 12/14/2008  . Malabsorption  of iron 08/31/2016  . OSA (obstructive sleep apnea) 08/06/2008  . Osteoarthritis 12/14/2008  .  Palpitations 11/12/2006   hx sinus tachy  . Psoriatic arthritis [facility])    sees rheumatology  . Rotator cuff injury     Family History  Problem Relation Age of Onset  . Arthritis Mother   . Alzheimer's disease Mother   . Stroke Father   . Alcohol abuse Father   . Diabetes Father   . Heart disease Father   . Early death Father 45  . Diabetes Sister   . Arthritis Sister   . COPD Sister   . Arthritis Maternal Aunt   . Diabetes Maternal Aunt   . Early death Maternal Aunt 31  . Diabetes Maternal Uncle   . Cancer Paternal Aunt   . COPD Paternal 32   . Heart disease Paternal Aunt   . Heart disease Paternal Uncle   . Alcohol abuse Paternal Grandfather   . Heart disease Paternal Grandfather   . Early death Paternal Grandfather 75  . Stroke Paternal Grandfather   . Hypertension Son   . Autoimmune disease Daughter   . Breast cancer Neg Hx    Past Surgical History:  Procedure Laterality Date  . ABDOMINAL HYSTERECTOMY    . CHOLECYSTECTOMY    . LAPAROSCOPIC GASTRIC BANDING    . PTCA    . WRIST SURGERY     LEFT   Social History   Social History Narrative   Married, [name]. 2 children name Abe People and Anderson Malta.   Some college, retired Engineer, manufacturing.   Drinks caffeine.   Take a daily vitamin.   Wears her seatbelt, exercises 3 times a week.   Smoke detector in the home.   Firearms in the home.   Feels safe in her relationships.    Objective: Vital Signs: BP 121/75 (BP Location: Left Arm, Patient Position: Sitting, Cuff Size: Normal)   Pulse 80   Resp 13   Ht 5\' 4"  (1.626 m)   Wt 168 lb 6.4 oz (76.4 kg)   BMI 28.91 kg/m    Physical Exam Vitals signs and nursing note reviewed.  Constitutional:      Appearance: She is well-developed.  HENT:     Head: Normocephalic and atraumatic.  Eyes:     Conjunctiva/sclera: Conjunctivae normal.  Neck:     Musculoskeletal: Normal range of motion.  Cardiovascular:     Rate and Rhythm: Normal rate and regular rhythm.       Heart sounds: Normal heart sounds.  Pulmonary:     Effort: Pulmonary effort is normal.     Breath sounds: Normal breath sounds.  Abdominal:     General: Bowel sounds are normal.     Palpations: Abdomen is soft.  Lymphadenopathy:     Cervical: No cervical adenopathy.  Skin:    General: Skin is warm and dry.     Capillary Refill: Capillary refill takes less than 2 seconds.  Neurological:     Mental Status: She is alert and oriented to person, place, and time.  Psychiatric:        Behavior: Behavior normal.      Musculoskeletal Exam: C-spine limited range of motion with discomfort.  She had bilateral trapezius spasm.  Shoulder joints elbow joints wrist joints with good range of motion.  She had DIP and PIP thickening with no synovitis.  Hip joints are in good range of motion.  Knee joints ankles MTPs with good range of motion with no synovitis.  She has generalized hyperalgesia and positive tender points.   CDAI Exam: CDAI Score: Not documented Patient Global Assessment: Not documented; Provider Global Assessment: Not documented Swollen: Not documented; Tender: Not documented Joint Exam   Not documented   There is currently no information documented on the homunculus. Go to the Rheumatology activity and complete the homunculus joint exam.  Investigation: No additional findings.  Imaging: No results found.  Recent Labs: Lab Results  Component Value Date   WBC 6.9 01/08/2018   HGB 13.5 01/08/2018   PLT 330 01/08/2018   NA 139 02/11/2018   K 5.0 02/11/2018   CL 101 02/11/2018   CO2 31 02/11/2018   GLUCOSE 93 02/11/2018   BUN 15 02/11/2018   CREATININE 0.74 02/11/2018   BILITOT 0.4 02/11/2018   ALKPHOS 53 02/11/2018   AST 14 02/11/2018   ALT 13 02/11/2018   PROT 6.7 02/11/2018   ALBUMIN 4.2 02/11/2018   CALCIUM 9.4 02/11/2018   GFRAA 77 10/13/2016    Speciality Comments: No specialty comments available.  Procedures:  No procedures performed Allergies:  Patient has no known allergies.   Assessment / Plan:     Visit Diagnoses: Psoriatic arthritis (HCC)-in remission patient has no synovitis.  Psoriasis-she has no active psoriasis lesions.  Primary osteoarthritis of both hands-she has DIP and PIP thickening consistent with osteoarthritis.  DDD (degenerative disc disease), cervical-she continues to have chronic cervical pain and has bilateral trapezius spasm today.  She is requesting a muscle relaxer.  She states she has had for an old prescription of Flexeril which ran out.  After different treatment options were discussed she was given tizanidine 4 mg p.o. nightly total 30 tablets with no refills.  She will use it on PRN basis.  Side effects were discussed.  Spondylosis without myelopathy or radiculopathy, lumbar region-she has chronic lower back pain.  Fibromyalgia - Well controlled with Cymbalta,  and Ambien.  Other fatigue-related to fibromyalgia.  Other insomnia - Ambien 5 mg as needed at bedtime for sleep.  She states she is able to sleep with Ambien.  History of vitamin D deficiency-she is on supplement.  Other medical problems are listed as follows:  Gastric bypass status for obesity  History of anxiety  History of depression  History of hypertension  History of diabetes mellitus  History of anemia  History of sleep apnea  History of carpal tunnel syndrome  History of asthma  History of scoliosis   Orders: No orders of the defined types were placed in this encounter.  Meds ordered this encounter  Medications  . tiZANidine (ZANAFLEX) 4 MG tablet    Sig: Take 1 tablet (4 mg total) by mouth at bedtime as needed for muscle spasms.    Dispense:  30 tablet    Refill:  0      Follow-Up Instructions: Return in about 6 months (around 10/15/2018) for Osteoarthritis, FMS, PSA.   Bo Merino, MD  Note - This record has been created using Editor, commissioning.  Chart creation errors have been sought, but may not  always  have been located. Such creation errors do not reflect on  the standard of medical care.

## 2018-04-13 ENCOUNTER — Other Ambulatory Visit: Payer: Self-pay | Admitting: Physician Assistant

## 2018-04-15 NOTE — Telephone Encounter (Signed)
Last visit: 10/04/2017 Next visit: 04/16/2018  Okay to refill Ambien?

## 2018-04-16 ENCOUNTER — Encounter: Payer: Self-pay | Admitting: Rheumatology

## 2018-04-16 ENCOUNTER — Ambulatory Visit (INDEPENDENT_AMBULATORY_CARE_PROVIDER_SITE_OTHER): Payer: Medicare Other | Admitting: Rheumatology

## 2018-04-16 VITALS — BP 121/75 | HR 80 | Resp 13 | Ht 64.0 in | Wt 168.4 lb

## 2018-04-16 DIAGNOSIS — M19041 Primary osteoarthritis, right hand: Secondary | ICD-10-CM | POA: Diagnosis not present

## 2018-04-16 DIAGNOSIS — M797 Fibromyalgia: Secondary | ICD-10-CM

## 2018-04-16 DIAGNOSIS — Z8639 Personal history of other endocrine, nutritional and metabolic disease: Secondary | ICD-10-CM

## 2018-04-16 DIAGNOSIS — Z9884 Bariatric surgery status: Secondary | ICD-10-CM | POA: Diagnosis not present

## 2018-04-16 DIAGNOSIS — G4709 Other insomnia: Secondary | ICD-10-CM

## 2018-04-16 DIAGNOSIS — Z8669 Personal history of other diseases of the nervous system and sense organs: Secondary | ICD-10-CM

## 2018-04-16 DIAGNOSIS — L4050 Arthropathic psoriasis, unspecified: Secondary | ICD-10-CM

## 2018-04-16 DIAGNOSIS — M47816 Spondylosis without myelopathy or radiculopathy, lumbar region: Secondary | ICD-10-CM | POA: Diagnosis not present

## 2018-04-16 DIAGNOSIS — L409 Psoriasis, unspecified: Secondary | ICD-10-CM

## 2018-04-16 DIAGNOSIS — Z8679 Personal history of other diseases of the circulatory system: Secondary | ICD-10-CM

## 2018-04-16 DIAGNOSIS — Z8659 Personal history of other mental and behavioral disorders: Secondary | ICD-10-CM

## 2018-04-16 DIAGNOSIS — R5383 Other fatigue: Secondary | ICD-10-CM

## 2018-04-16 DIAGNOSIS — Z8739 Personal history of other diseases of the musculoskeletal system and connective tissue: Secondary | ICD-10-CM

## 2018-04-16 DIAGNOSIS — M5030 Other cervical disc degeneration, unspecified cervical region: Secondary | ICD-10-CM | POA: Diagnosis not present

## 2018-04-16 DIAGNOSIS — Z8709 Personal history of other diseases of the respiratory system: Secondary | ICD-10-CM

## 2018-04-16 DIAGNOSIS — M19042 Primary osteoarthritis, left hand: Secondary | ICD-10-CM

## 2018-04-16 DIAGNOSIS — Z862 Personal history of diseases of the blood and blood-forming organs and certain disorders involving the immune mechanism: Secondary | ICD-10-CM

## 2018-04-16 MED ORDER — TIZANIDINE HCL 4 MG PO TABS: 4.0000 mg | ORAL_TABLET | Freq: Every evening | ORAL | 0 refills | Status: DC | PRN

## 2018-05-01 ENCOUNTER — Other Ambulatory Visit: Payer: Self-pay | Admitting: Internal Medicine

## 2018-05-13 ENCOUNTER — Other Ambulatory Visit: Payer: Self-pay | Admitting: Physician Assistant

## 2018-05-13 NOTE — Telephone Encounter (Signed)
Last Visit: 04/16/18 Next visit: 10/15/18  Okay to refill Ambien? 

## 2018-05-17 ENCOUNTER — Ambulatory Visit (INDEPENDENT_AMBULATORY_CARE_PROVIDER_SITE_OTHER): Payer: Medicare Other | Admitting: Family Medicine

## 2018-05-17 ENCOUNTER — Encounter: Payer: Self-pay | Admitting: Family Medicine

## 2018-05-17 ENCOUNTER — Ambulatory Visit: Payer: Medicare Other | Admitting: Internal Medicine

## 2018-05-17 VITALS — BP 122/84 | HR 87 | Temp 98.0°F | Ht 64.0 in | Wt 163.8 lb

## 2018-05-17 DIAGNOSIS — Z794 Long term (current) use of insulin: Secondary | ICD-10-CM

## 2018-05-17 DIAGNOSIS — J0100 Acute maxillary sinusitis, unspecified: Secondary | ICD-10-CM | POA: Diagnosis not present

## 2018-05-17 DIAGNOSIS — E11319 Type 2 diabetes mellitus with unspecified diabetic retinopathy without macular edema: Secondary | ICD-10-CM | POA: Insufficient documentation

## 2018-05-17 MED ORDER — FLUTICASONE PROPIONATE 50 MCG/ACT NA SUSP: 2.0000 | Freq: Every day | NASAL | 6 refills | Status: DC

## 2018-05-17 MED ORDER — BETAMETHASONE SOD PHOS & ACET 6 (3-3) MG/ML IJ SUSP
6.0000 mg | Freq: Once | INTRAMUSCULAR | Status: AC
  Administered 2018-05-17: 6 mg via INTRAMUSCULAR

## 2018-05-17 NOTE — Progress Notes (Deleted)
Patient ID: Priscilla [name], female   DOB: 05-21-1950, 68 y.o.   MRN: [id number]  HPI: Priscilla [name] is a 68 y.o.-year-old female, returning for follow-up for DM2, dx in 1990s, insulin-dependent since ~2001, uncontrolled, with complications (DR OS, PN). She previously saw Drs Dwyane Dee (distant past) and [place] (more recently). Last visit with me 7 months ago.  Last hemoglobin A1c was: Lab Results  Component Value Date   HGBA1C 6.0 (A) 10/17/2017   HGBA1C 6.3 06/13/2017   HGBA1C 5.9 11/24/2016  03/16/2017: HbA1c calculated from fructosamine: 5.6% 10/18/2015: HbA1c calculated from fructosamine: 5.7% 02/2015: HbA1c 7.4%, HbA1c calculated from fructosamine: 5.6%  She is on: - Metformin 1000 mg 2x a day with meals - Levemir 30 units at night - Mealtime Novolog:  - 4-6units before a smaller meal - 8-10 units before a regular meal - 12-15 units before a large meal  - Sliding scale Novolog: - 151-175: + 1 unit  - 176-200: + 2 units  - 201-225: + 3 units  - 226-250: + 4 units  - >250: + 5 units If sugars before the meal are 60 or lower, please do not take the Novolog dose. If sugars before the meal are 61-80, take only half of the Novolog dose. She was on Victoza 2.5 mg daily >> stopped as she could not afford this.  Pt checks her sugars 2-3 times a day per review of her excellent log: - am:  78-118, 133 >> 82-130 - 2h after b'fast:96 >> 120, 185 - before lunch: 79-119 >> 120-123 - 2h after lunch:  111-130 >> 120-135 - before dinner: 120-155, 171 >> 106-150 - 2h after dinner: 133-190, 220 >> 102-165 - bedtime: 88-180 >> 106-160 - nighttime: n/c >> 46-70 end of June -  overbolused Lowest sugar was 46 >> ***; she has hypoglycemia awareness in the 70s. Highest sugar was 285 x1 (cake) >> ***.  Glucometer: Freestyle  -No CKD, last BUN/creatinine:  Lab Results  Component Value Date   BUN 15 02/11/2018   CREATININE 0.74 02/11/2018  ACR (12/2014): 6.9 On lisinopril 1.25 mg  daily. -+ HL; last set of lipids: Lab Results  Component Value Date   CHOL 187 02/11/2018   HDL 61.40 02/11/2018   LDLCALC 107 (H) 02/11/2018   TRIG 92.0 02/11/2018   CHOLHDL 3 02/11/2018  She is now back on pravastatin. - last eye exam was in 09/2017: No DR. 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  She has floaters. - + Numbness and tingling in her feet.  She takes B12.  Last TSH normal: Lab Results  Component Value Date   TSH 2.08 04/25/2017   She also has a history of lap band surgery>> no large meals for she usually grazes, especially at night  ROS: Constitutional: no weight gain/no weight loss, no fatigue, no subjective hyperthermia, no subjective hypothermia Eyes: no blurry vision, no xerophthalmia ENT: no sore throat, no nodules palpated in neck, no dysphagia, no odynophagia, no hoarseness Cardiovascular: no CP/no SOB/no palpitations/no leg swelling Respiratory: no cough/no SOB/no wheezing Gastrointestinal: no N/no V/no D/no C/no acid reflux Musculoskeletal: no muscle aches/no joint aches Skin: no rashes, no hair loss Neurological: no tremors/no numbness/no tingling/no dizziness  I reviewed pt's medications, allergies, PMH, social hx, family hx, and changes were documented in the history of present illness. Otherwise, unchanged from my initial visit note.  Past Medical History:  Diagnosis Date  . Anemia 12/14/2008  .  Anxiety and depression 12/14/2008  . Asthma 11/12/2006  . CAD (coronary artery disease) 12/14/2008   hx transluminal coronary angioplasty  . Cataract   . Depression   . Diabetes mellitus with neuropathy ([place]) 12/14/2008  . Fibromyalgia 01/04/2007   sees rheuamtology  . 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 Hyperlipemia 01/21/2010  . Hypertension 12/14/2008  . Leg edema 12/14/2008  . 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 OSA (obstructive sleep apnea) 08/06/2008  . Osteoarthritis 12/14/2008  . Palpitations  11/12/2006   hx sinus tachy  . Psoriatic arthritis [facility])    sees rheumatology  . Rotator cuff injury    Past Surgical History:  Procedure Laterality Date  . ABDOMINAL HYSTERECTOMY    . CHOLECYSTECTOMY    . LAPAROSCOPIC GASTRIC BANDING    . PTCA    . WRIST SURGERY     LEFT   Social History   Social History  . Marital Status: Married    Spouse Name: N/A  . Number of Children: N/A   Occupational History  . Not on file.   Social History Main Topics  . Smoking status: Never Smoker   . Smokeless tobacco: Never Used  . Alcohol Use: Yes     Comment: glass of wine-specially occasion  . Drug Use: No   Current Outpatient Medications on File Prior to Visit  Medication Sig Dispense Refill  . albuterol (PROVENTIL HFA;VENTOLIN HFA) 108 (90 Base) MCG/ACT inhaler Inhale 2 puffs into the lungs every 6 (six) hours as needed for wheezing or shortness of breath. 1 Inhaler 1  . aspirin 81 MG tablet Take 81 mg by mouth daily.      . Biotin 5000 MCG TABS Take 5,000 mcg by mouth daily.     . calcium-vitamin D 250-100 MG-UNIT per tablet Take 1 tablet by mouth daily.     . Cyanocobalamin (VITAMIN B12 PO) Place under the tongue.    . DULoxetine (CYMBALTA) 30 MG capsule TAKE 1 CAPSULE BY MOUTH EVERY DAY 90 capsule 1  . DULoxetine (CYMBALTA) 60 MG capsule TAKE 1 CAPSULE BY MOUTH EVERY DAY 90 capsule 1  . glucagon (GLUCAGON EMERGENCY) 1 MG injection Inject 1 mg into the muscle once as needed. 1 each 12  . glucose blood (ONETOUCH VERIO) test strip USE TO TEST BLOOD SUGAR TWICE A DAY DX: E11.319 200 each 0  . insulin detemir (LEVEMIR) 100 UNIT/ML injection Inject 0.3 mLs (30 Units total) into the skin at bedtime. DX: E11.319 30 mL 3  . Insulin Pen Needle 32G X 6 MM MISC Use to inject Novolog 3 times daily DX: E11.319 300 each 1  . Insulin Syringe-Needle U-100 (B-D INS SYR MICROFINE 1CC/27G) 27G X 5/8" 1 ML MISC Use to inject levemir once daily DX: E11.319 120 each 6  . lisinopril (PRINIVIL,ZESTRIL) 2.5 MG  tablet Take 1 tablet (2.5 mg total) by mouth daily. 90 tablet 1  . metFORMIN (GLUCOPHAGE) 1000 MG tablet TAKE 1 TABLET BY MOUTH 2 TIMES DAILY WITH MEALS 60 tablet 2  . metoprolol tartrate (LOPRESSOR) 25 MG tablet Take 1 tablet (25 mg total) by mouth 2 (two) times daily. 180 tablet 1  . Multiple Vitamin (MULTIVITAMIN) tablet Take 1 tablet by mouth daily.     Marland Kitchen NOVOLOG FLEXPEN 100 UNIT/ML FlexPen INJECT 6-8 UNITS SUBCUTANEOUSL THREE TIMES DAILY WITH MEALS PLUS SLIDING SCALE 15 mL 0  . ONE TOUCH LANCETS MISC USE TO TEST BLOOD SUGAR TWICE A DAY DX: E11.319 200 each 1  . pravastatin (PRAVACHOL)  20 MG tablet Take 1 tablet (20 mg total) by mouth daily. 90 tablet 3  . tiZANidine (ZANAFLEX) 4 MG tablet Take 1 tablet (4 mg total) by mouth at bedtime as needed for muscle spasms. 30 tablet 0  . Vitamin D, Ergocalciferol, (DRISDOL) 50000 units CAPS capsule TAKE 1 CAPSULE BY MOUTH every 7 DAYS 15 capsule 3  . zolpidem (AMBIEN) 5 MG tablet TAKE 1 TABLET BY MOUTH AT BEDTIME as needed for insomnia 30 tablet 0   No current facility-administered medications on file prior to visit.    No Known Allergies Family History  Problem Relation Age of Onset  . Arthritis Mother   . Alzheimer's disease Mother   . Stroke Father   . Alcohol abuse Father   . Diabetes Father   . Heart disease Father   . Early death Father 69  . Diabetes Sister   . Arthritis Sister   . COPD Sister   . Arthritis Maternal Aunt   . Diabetes Maternal Aunt   . Early death Maternal Aunt 24  . Diabetes Maternal Uncle   . Cancer Paternal Aunt   . COPD Paternal 72   . Heart disease Paternal Aunt   . Heart disease Paternal Uncle   . Alcohol abuse Paternal Grandfather   . Heart disease Paternal Grandfather   . Early death Paternal Grandfather 66  . Stroke Paternal Grandfather   . Hypertension Son   . Autoimmune disease Daughter   . Breast cancer Neg Hx    PE: There were no vitals taken for this visit. There is no height or weight on  file to calculate BMI. Wt Readings from Last 3 Encounters:  04/16/18 168 lb 6.4 oz (76.4 kg)  02/11/18 167 lb 8 oz (76 kg)  01/08/18 168 lb 6.4 oz (76.4 kg)   Constitutional: overweight, in NAD Eyes: PERRLA, EOMI, no exophthalmos ENT: moist mucous membranes, no thyromegaly, no cervical lymphadenopathy Cardiovascular: RRR, No MRG Respiratory: CTA B Gastrointestinal: abdomen soft, NT, ND, BS+ Musculoskeletal: no deformities, strength intact in all 4 Skin: moist, warm, no rashes Neurological: no tremor with outstretched hands, DTR normal in all 4  ASSESSMENT: 1. DM2, insulin-dependent, uncontrolled, with complications - Diabetic retinopathy left eye - Peripheral neuropathy  2. HL  3. PN   PLAN:  1. Patient with longstanding, uncontrolled, type 2 diabetes, on metformin, and basal-bolus insulin regimen with very good control.  At last visit, HbA1c was excellent, at 6.3%.  Sugars were mostly at goal with few exceptions: Hyperglycemia followed by hypoglycemia for a few days during the summer.  Afterwards, lowest sugar has been in the 80s.  Higher sugars in the 180s. -At this visit, we reviewed her very good blood sugar log and sugars remain at goal  - I advised her to:  Patient Instructions  Please continue: - Metformin 1000 mg 2x a day with meals - Levemir 30 units at night - Mealtime Novolog:  - 4-6 units before a smaller meal - 8-10 units before a regular meal - 12-15 units before a large meal  - Sliding scale Novolog: - 151-175: + 1 unit  - 176-200: + 2 units  - 201-225: + 3 units  - 226-250: + 4 units  - >250: + 5 units If sugars before the meal are 60 or lower, please do not take the Novolog dose. If sugars before the meal are 61-80, take only half of the Novolog dose.  Please come back for a follow-up appointment in 4 months.  -  today, HbA1c is 7%  - continue checking sugars at different times of the day - check 3-4x a day, rotating checks - advised for yearly eye  exams >> she is UTD - Return to clinic in 4 mo with sugar log    2. HL - Reviewed latest lipid panel from 02/2018, LDL slightly above goal Lab Results  Component Value Date   CHOL 187 02/11/2018   HDL 61.40 02/11/2018   LDLCALC 107 (H) 02/11/2018   TRIG 92.0 02/11/2018   CHOLHDL 3 02/11/2018  - Continues pravastatin without side effects.  3. PN -Most likely secondary to diabetes -Continues on B12 vitamin and Cymbalta -I also suggested alpha-lipoic acid 600 mg twice a day at previous visits  Philemon Kingdom, MD PhD [facility] Endocrinology

## 2018-05-17 NOTE — Progress Notes (Signed)
Patient: Priscilla [name] MRN: [id number] DOB: [birth year] PCP: Ma Hillock, DO     Subjective:  Chief Complaint  Patient presents with  . sinus pain/pressure    HPI: The patient is a 68 y.o. female who presents today for sinus pain and pressure. She states she had a cold on Tuesday night and then her teeth, jaw and lymph nodes became sore. She has a headache in the back of her eyes. She has had no fever/chills. She has 5 grandkids and keeps the 26 year old. This is her job and she wanted to make sure she was okay. The pain is more in her jaw/TMJ bilaterally. No pain with opening and closing her mouth, no pain with chewing. She does feel like her ears are stopped up. NO pain though and no drainage. She has a mild cough and needed her inhaler just a few times. She does have a hx of asthma. She does not smoke. She has not taken any otc medication.   Review of Systems  Constitutional: Positive for chills and fatigue. Negative for fever.  HENT: Positive for congestion, dental problem, ear pain, rhinorrhea and sore throat. Negative for postnasal drip, sinus pressure and sinus pain.        C/o b/l ears feeling "full"  Pain in b/l jaws, teeth hurt  Respiratory: Positive for cough. Negative for shortness of breath.   Cardiovascular: Negative for chest pain.  Gastrointestinal: Negative for abdominal pain and nausea.  Musculoskeletal: Positive for back pain and neck pain.  Neurological: Negative for dizziness and headaches.    Allergies Patient has No Known Allergies.  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  Surgical History Patient  has a past surgical history that includes Cholecystectomy; Abdominal hysterectomy; Laparoscopic gastric banding; Wrist surgery; and Mitral valve replacement.  Family History Pateint's family history includes Alcohol abuse in her father and paternal grandfather; Alzheimer's disease in her mother; Arthritis in her maternal aunt, mother, and sister; Autoimmune disease in her daughter; COPD in her paternal aunt and sister; Cancer in her paternal aunt; Diabetes in her father, maternal aunt, maternal uncle, and sister; Early death (age of onset: 52) in her maternal aunt; Early death (age of onset: 70) in her paternal grandfather; Early death (age of onset: 53) in her father; Heart disease in her father, paternal aunt, paternal grandfather, and paternal uncle; Hypertension in her son; Stroke in her father and paternal grandfather.  Social History Patient  reports that she has never smoked. She has never used smokeless tobacco. She reports current alcohol use. She reports that she does not use drugs.    Objective: Vitals:   05/17/18 1442  BP: 122/84  Pulse: 87  Temp: 98 F (36.7 C)  TempSrc: Oral  SpO2: 98%  Weight: 163 lb 12.8 oz (74.3 kg)  Height: 5\' 4"  (1.626 m)    Body mass index is 28.12 kg/m.  Physical Exam Vitals signs reviewed.  Constitutional:      Appearance: Normal appearance.  HENT:     Head:     Comments: No TTP over TM joints. ttp over masseter muscles. She also is ttp over her  maxillary sinuses.      Right Ear: Tympanic membrane, ear canal and external ear normal.     Left Ear: Tympanic membrane, ear canal and external ear normal.     Nose: Nose normal.     Comments: Right nasal turbinate moderately edematous     Mouth/Throat:     Mouth: Mucous membranes are moist.     Comments: Mild cobblestoning  on posterior pharynx  Neck:     Musculoskeletal: Normal range of motion and neck supple.  Cardiovascular:     Rate and Rhythm: Normal rate and regular rhythm.     Heart sounds: Normal heart sounds.  Pulmonary:     Effort: Pulmonary effort is normal.     Breath sounds: Normal breath sounds.  Abdominal:     General: Abdomen is flat. Bowel sounds are normal.     Palpations: Abdomen is soft.  Lymphadenopathy:     Cervical: No cervical adenopathy.  Skin:    Capillary Refill: Capillary refill takes less than 2 seconds.  Neurological:     Mental Status: She is alert.  Psychiatric:        Mood and Affect: Mood normal.        Behavior: Behavior normal.        Assessment/plan: 1. Acute non-recurrent maxillary sinusitis No signs of bacterial infection at this point. I want her to start flonase nightly and a cool mist humidifier. Will do low dose steroid shot in office today, but cautioned that her sugars may get a little high. If pain continues or does not get better by 7-10 days would see pcp. Also feel like she may have clenched jaw or grinded teeth as her masseter muscles are sore. Conservative therapy.  - betamethasone acetate-betamethasone sodium phosphate (CELESTONE) injection 6 mg   Return if symptoms worsen or fail to improve.  Orma Flaming, MD [place]   05/17/2018

## 2018-05-17 NOTE — Patient Instructions (Signed)
flonase nightly Cool mist humidifier tylenol prn   Sinusitis, Adult Sinusitis is soreness and swelling (inflammation) of your sinuses. Sinuses are hollow spaces in the bones around your face. They are located:  Around your eyes.  In the middle of your forehead.  Behind your nose.  In your cheekbones. Your sinuses and nasal passages are lined with a fluid called mucus. Mucus drains out of your sinuses. Swelling can trap mucus in your sinuses. This lets germs (bacteria, virus, or fungus) grow, which leads to infection. Most of the time, this condition is caused by a virus. What are the causes? This condition is caused by:  Allergies.  Asthma.  Germs.  Things that block your nose or sinuses.  Growths in the nose (nasal polyps).  Chemicals or irritants in the air.  Fungus (rare). What increases the risk? You are more likely to develop this condition if:  You have a weak body defense system (immune system).  You do a lot of swimming or diving.  You use nasal sprays too much.  You smoke. What are the signs or symptoms? The main symptoms of this condition are pain and a feeling of pressure around the sinuses. Other symptoms include:  Stuffy nose (congestion).  Runny nose (drainage).  Swelling and warmth in the sinuses.  Headache.  Toothache.  A cough that may get worse at night.  Mucus that collects in the throat or the back of the nose (postnasal drip).  Being unable to smell and taste.  Being very tired (fatigue).  A fever.  Sore throat.  Bad breath. How is this diagnosed? This condition is diagnosed based on:  Your symptoms.  Your medical history.  A physical exam.  Tests to find out if your condition is short-term (acute) or long-term (chronic). Your doctor may: ? Check your nose for growths (polyps). ? Check your sinuses using a tool that has a light (endoscope). ? Check for allergies or germs. ? Do imaging tests, such as an MRI or CT  scan. How is this treated? Treatment for this condition depends on the cause and whether it is short-term or long-term.  If caused by a virus, your symptoms should go away on their own within 10 days. You may be given medicines to relieve symptoms. They include: ? Medicines that shrink swollen tissue in the nose. ? Medicines that treat allergies (antihistamines). ? A spray that treats swelling of the nostrils. ? Rinses that help get rid of thick mucus in your nose (nasal saline washes).  If caused by bacteria, your doctor may wait to see if you will get better without treatment. You may be given antibiotic medicine if you have: ? A very bad infection. ? A weak body defense system.  If caused by growths in the nose, you may need to have surgery. Follow these instructions at home: Medicines  Take, use, or apply over-the-counter and prescription medicines only as told by your doctor. These may include nasal sprays.  If you were prescribed an antibiotic medicine, take it as told by your doctor. Do not stop taking the antibiotic even if you start to feel better. Hydrate and humidify   Drink enough water to keep your pee (urine) pale yellow.  Use a cool mist humidifier to keep the humidity level in your home above 50%.  Breathe in steam for 10-15 minutes, 3-4 times a day, or as told by your doctor. You can do this in the bathroom while a hot shower is running.  Try  not to spend time in cool or dry air. Rest  Rest as much as you can.  Sleep with your head raised (elevated).  Make sure you get enough sleep each night. General instructions   Put a warm, moist washcloth on your face 3-4 times a day, or as often as told by your doctor. This will help with discomfort.  Wash your hands often with soap and water. If there is no soap and water, use hand sanitizer.  Do not smoke. Avoid being around people who are smoking (secondhand smoke).  Keep all follow-up visits as told by your  doctor. This is important. Contact a doctor if:  You have a fever.  Your symptoms get worse.  Your symptoms do not get better within 10 days. Get help right away if:  You have a very bad headache.  You cannot stop throwing up (vomiting).  You have very bad pain or swelling around your face or eyes.  You have trouble seeing.  You feel confused.  Your neck is stiff.  You have trouble breathing. Summary  Sinusitis is swelling of your sinuses. Sinuses are hollow spaces in the bones around your face.  This condition is caused by tissues in your nose that become inflamed or swollen. This traps germs. These can lead to infection.  If you were prescribed an antibiotic medicine, take it as told by your doctor. Do not stop taking it even if you start to feel better.  Keep all follow-up visits as told by your doctor. This is important. This information is not intended to replace advice given to you by your health care provider. Make sure you discuss any questions you have with your health care provider. Document Released: 09/13/2007 Document Revised: 08/27/2017 Document Reviewed: 08/27/2017 Elsevier Interactive Patient Education  2019 [employer].

## 2018-05-22 NOTE — Progress Notes (Signed)
Patient ID: Priscilla [name], female   DOB: [birth year], 68 y.o.   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 She previously saw Drs Dwyane Dee (distant past) and [place] (more recently). Last visit with me 7 months ago.  Sugars were higher last week when she had a sinus inf. She also had steroid inj >> CBG reached 247.  Last hemoglobin A1c was: Lab Results  Component Value Date   HGBA1C 6.0 (A) 10/17/2017   HGBA1C 6.3 06/13/2017   HGBA1C 5.9 11/24/2016  03/16/2017: HbA1c calculated from fructosamine: 5.6% 10/18/2015: HbA1c calculated from fructosamine: 5.7% 02/2015: HbA1c 7.4%, HbA1c calculated from fructosamine: 5.6%  She is on: - Metformin 1000 mg 2x a day with meals - Levemir 30 units at night - Mealtime Novolog:  -4-6 units before a smaller meal -8-10 units before a regular meal -12-15 units before a large meal  - Sliding scale Novolog: - 151-175: + 1 unit  - 176-200: + 2 units  - 201-225: + 3 units  - 226-250: + 4 units  - >250: + 5 units If sugars before the meal are 60 or lower, please do not take the Novolog dose. If sugars before the meal are 61-80, take only half of the Novolog dose. She was on Victoza 2.5 mg daily >> stopped as she could not afford this.  Pt checks her sugars 2-3 times a day per review of her excellent log: - am:  78-118, 133 >> 82-130 >> 91-116, except when sick: 140-151 - 2h after b'fast:96 >> 120, 185 >> 109-178 - before lunch: 79-119 >> 120-123 >> 95-139 - 2h after lunch:  111-130 >> 120-135 >> 83-123 - before dinner: 120-155, 171 >> 106-150 >> 71, 112 but when sick: 148-247 - 2h after dinner: 133-190, 220 >> 102-165 >> 140-170, 229 when sick - bedtime: 88-180 >> 106-160 >> 108-156, 200 - nighttime: n/c >> 46-70 end of June -  overbolused >> 50 (?) Lowest sugar was 46 >> 50-at night - ? cause; she  has hypoglycemia awareness in the 70s. Highest sugar was 285 x1 (cake) >> 247.  Glucometer: Freestyle  -No CKD, last BUN/creatinine:  Lab Results  Component Value Date   BUN 15 02/11/2018   CREATININE 0.74 02/11/2018  ACR (12/2014): 6.9 On lisinopril 1.25 mg daily -+ HL; last set of lipids: Lab Results  Component Value Date   CHOL 187 02/11/2018   HDL 61.40 02/11/2018   LDLCALC 107 (H) 02/11/2018   TRIG 92.0 02/11/2018   CHOLHDL 3 02/11/2018  On pravastatin. - last eye exam was in 09/2017: No DR. 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  She does have floaters. - + Numbness and tingling in her feet, also burning.  On B12.  Last TSH normal: Lab Results  Component Value Date   TSH 2.08 04/25/2017   She also has a history of lap band surgery>> no large meals for she usually grazes, especially at night  ROS: Constitutional: no weight gain/no weight loss, + fatigue, no subjective hyperthermia, no subjective hypothermia, + nocturia Eyes: no blurry vision, no xerophthalmia ENT: no sore throat, no nodules palpated in neck, no dysphagia, no odynophagia, no hoarseness Cardiovascular: no CP/no SOB/no palpitations/no leg swelling Respiratory: no cough/no SOB/no wheezing Gastrointestinal: no N/no V/no D/no C/no acid reflux Musculoskeletal: no muscle aches/no joint aches  Skin: no rashes, no hair loss Neurological: no tremors/+ numbness/+ tingling/no dizziness  I reviewed pt's medications, allergies, PMH, social hx, family hx, and changes were documented in the history of present illness. Otherwise, unchanged from my initial visit note.   Past Medical History:  Diagnosis Date  . Anemia 12/14/2008  . Anxiety and depression 12/14/2008  . Asthma 11/12/2006  . CAD (coronary artery disease) 12/14/2008   hx transluminal coronary angioplasty  . Cataract   . Depression   . Diabetes mellitus with neuropathy ([place]) 12/14/2008  . Fibromyalgia  01/04/2007   sees rheuamtology  . 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 Hyperlipemia 01/21/2010  . Hypertension 12/14/2008  . Leg edema 12/14/2008  . 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 OSA (obstructive sleep apnea) 08/06/2008  . Osteoarthritis 12/14/2008  . Palpitations 11/12/2006   hx sinus tachy  . Psoriatic arthritis [facility])    sees rheumatology  . Rotator cuff injury    Past Surgical History:  Procedure Laterality Date  . ABDOMINAL HYSTERECTOMY    . CHOLECYSTECTOMY    . LAPAROSCOPIC GASTRIC BANDING    . PTCA    . WRIST SURGERY     LEFT   Social History   Social History  . Marital Status: Married    Spouse Name: N/A  . Number of Children: N/A   Occupational History  . Not on file.   Social History Main Topics  . Smoking status: Never Smoker   . Smokeless tobacco: Never Used  . Alcohol Use: Yes     Comment: glass of wine-specially occasion  . Drug Use: No   Current Outpatient Medications on File Prior to Visit  Medication Sig Dispense Refill  . albuterol (PROVENTIL HFA;VENTOLIN HFA) 108 (90 Base) MCG/ACT inhaler Inhale 2 puffs into the lungs every 6 (six) hours as needed for wheezing or shortness of breath. 1 Inhaler 1  . aspirin 81 MG tablet Take 81 mg by mouth daily.      . Biotin 5000 MCG TABS Take 5,000 mcg by mouth daily.     . calcium-vitamin D 250-100 MG-UNIT per tablet Take 1 tablet by mouth daily.     . Cyanocobalamin (VITAMIN B12 PO) Place under the tongue.    . DULoxetine (CYMBALTA) 30 MG capsule TAKE 1 CAPSULE BY MOUTH EVERY DAY 90 capsule 1  . DULoxetine (CYMBALTA) 60 MG capsule TAKE 1 CAPSULE BY MOUTH EVERY DAY 90 capsule 1  . fluticasone (FLONASE) 50 MCG/ACT nasal spray Place 2 sprays into both nostrils daily. 16 g 6  . glucagon (GLUCAGON EMERGENCY) 1 MG injection Inject 1 mg into the muscle once as needed. 1 each 12  . glucose blood (ONETOUCH VERIO) test strip USE TO TEST BLOOD SUGAR TWICE A DAY DX: E11.319 200 each 0  . insulin detemir (LEVEMIR)  100 UNIT/ML injection Inject 0.3 mLs (30 Units total) into the skin at bedtime. DX: E11.319 30 mL 3  . Insulin Pen Needle 32G X 6 MM MISC Use to inject Novolog 3 times daily DX: E11.319 300 each 1  . Insulin Syringe-Needle U-100 (B-D INS SYR MICROFINE 1CC/27G) 27G X 5/8" 1 ML MISC Use to inject levemir once daily DX: E11.319 120 each 6  . lisinopril (PRINIVIL,ZESTRIL) 2.5 MG tablet Take 1 tablet (2.5 mg total) by mouth daily. 90 tablet 1  . metFORMIN (GLUCOPHAGE) 1000 MG tablet TAKE 1 TABLET BY MOUTH 2 TIMES DAILY WITH MEALS 60 tablet 2  . metoprolol tartrate (LOPRESSOR) 25 MG tablet Take 1 tablet (  25 mg total) by mouth 2 (two) times daily. 180 tablet 1  . Multiple Vitamin (MULTIVITAMIN) tablet Take 1 tablet by mouth daily.     Marland Kitchen NOVOLOG FLEXPEN 100 UNIT/ML FlexPen INJECT 6-8 UNITS SUBCUTANEOUSL THREE TIMES DAILY WITH MEALS PLUS SLIDING SCALE 15 mL 0  . ONE TOUCH LANCETS MISC USE TO TEST BLOOD SUGAR TWICE A DAY DX: E11.319 200 each 1  . pravastatin (PRAVACHOL) 20 MG tablet Take 1 tablet (20 mg total) by mouth daily. 90 tablet 3  . tiZANidine (ZANAFLEX) 4 MG tablet Take 1 tablet (4 mg total) by mouth at bedtime as needed for muscle spasms. 30 tablet 0  . Vitamin D, Ergocalciferol, (DRISDOL) 50000 units CAPS capsule TAKE 1 CAPSULE BY MOUTH every 7 DAYS 15 capsule 3  . zolpidem (AMBIEN) 5 MG tablet TAKE 1 TABLET BY MOUTH AT BEDTIME as needed for insomnia 30 tablet 0   No current facility-administered medications on file prior to visit.    No Known Allergies Family History  Problem Relation Age of Onset  . Arthritis Mother   . Alzheimer's disease Mother   . Stroke Father   . Alcohol abuse Father   . Diabetes Father   . Heart disease Father   . Early death Father 44  . Diabetes Sister   . Arthritis Sister   . COPD Sister   . Arthritis Maternal Aunt   . Diabetes Maternal Aunt   . Early death Maternal Aunt 1  . Diabetes Maternal Uncle   . Cancer Paternal Aunt   . COPD Paternal 33   .  Heart disease Paternal Aunt   . Heart disease Paternal Uncle   . Alcohol abuse Paternal Grandfather   . Heart disease Paternal Grandfather   . Early death Paternal Grandfather 102  . Stroke Paternal Grandfather   . Hypertension Son   . Autoimmune disease Daughter   . Breast cancer Neg Hx    PE: LMP  (LMP Unknown)  There is no height or weight on file to calculate BMI. Wt Readings from Last 3 Encounters:  05/17/18 163 lb 12.8 oz (74.3 kg)  04/16/18 168 lb 6.4 oz (76.4 kg)  02/11/18 167 lb 8 oz (76 kg)   Constitutional: overweight, in NAD Eyes: PERRLA, EOMI, no exophthalmos ENT: moist mucous membranes, no thyromegaly, no cervical lymphadenopathy Cardiovascular: RRR, No MRG Respiratory: CTA B Gastrointestinal: abdomen soft, NT, ND, BS+ Musculoskeletal: no deformities, strength intact in all 4 Skin: moist, warm, no rashes Neurological: no tremor with outstretched hands, DTR normal in all 4  ASSESSMENT: 1. DM2, insulin-dependent, uncontrolled, with complications - Diabetic retinopathy left eye - Peripheral neuropathy  2. HL  3. PN   PLAN:  1. Patient with longstanding, uncontrolled, type 2 diabetes, on metformin, and basal-bolus insulin regimen with very good control.  At last visit, HbA1c was excellent, at 6.0%.  Sugars were mostly at goal with 3 exceptions.  She had several occasions of hyperglycemia followed by hypoglycemia during the summer, but afterwards, sugars have been 80s at the lowest.  Highest blood sugars in the 180s. -The above pattern continues now with most of her sugars being mostly at goal, with few exceptions above goal but with higher blood sugars when she was sick last week and especially after her steroid shot.  We discussed about how she should change her insulin doses if she has to take steroids again.  I advised her to increase her NovoLog, rather than Levemir.. -She tells me that this regimen became not  affordable to her after she entered the donut hole  earlier this winter.  We discussed about the possibility of using NPH and regular insulin when this happens next year.  I explained the differences between NPH and Levemir and also between the regular insulin and NovoLog.  I advised her to let me know if she cannot afford insulins so we can switch to the above regimen.  For now, she cannot afford them so we will continue with Levemir and NovoLog -Her sugars appear slightly lower after lunch.  I advised her that she may need to decrease the doses of insulin with this meal (she usually takes 6-8 units) if she continues to see her lower blood sugars after lunch. - I advised her to:  Patient Instructions  Please continue: - Metformin 1000 mg 2x a day with meals - Levemir 30 units at night - Mealtime Novolog:  -4-6 units before a smaller meal -8-10 units before a regular meal -12-15 units before a large meal  - Sliding scale Novolog: - 151-175: + 1 unit  - 176-200: + 2 units  - 201-225: + 3 units  - 226-250: + 4 units  - >250: + 5 units If sugars before the meal are 60 or lower, please do not take the Novolog dose. If sugars before the meal are 61-80, take only half of the Novolog dose.  Please come back for a follow-up appointment in 4 months.  - today, HbA1c is 6.4% (higher) - continue checking sugars at different times of the day - check 3-4x a day, rotating checks - advised for yearly eye exams >> she is UTD - Return to clinic in 4 mo with sugar log    2. HL -Reviewed latest lipid panel from 02/2018: LDL slightly above goal: Lab Results  Component Value Date   CHOL 187 02/11/2018   HDL 61.40 02/11/2018   LDLCALC 107 (H) 02/11/2018   TRIG 92.0 02/11/2018   CHOLHDL 3 02/11/2018  -Continues pravastatin without side effects.  3. PN -Most likely secondary to diabetes -Continues on B12 vitamin and Cymbalta -I again suggested alpha-lipoic acid as she does complain of burning in her feet  Philemon Kingdom, MD PhD [facility]  Endocrinology

## 2018-05-22 NOTE — Patient Instructions (Addendum)
Please continue: - Metformin 1000 mg 2x a day with meals - Levemir 30 units at night - Mealtime Novolog:  -4-6 units before a smaller meal -8-10 units before a regular meal -12-15 units before a large meal  - Sliding scale Novolog: - 151-175: + 1 unit  - 176-200: + 2 units  - 201-225: + 3 units  - 226-250: + 4 units  - >250: + 5 units If sugars before the meal are 60 or lower, please do not take the Novolog dose. If sugars before the meal are 61-80, take only half of the Novolog dose.  Try to start Alpha lipoic acid 600 mg 2x a day.  Please come back for a follow-up appointment in 4 months.

## 2018-05-23 ENCOUNTER — Ambulatory Visit (INDEPENDENT_AMBULATORY_CARE_PROVIDER_SITE_OTHER): Payer: Medicare Other | Admitting: Internal Medicine

## 2018-05-23 ENCOUNTER — Encounter: Payer: Self-pay | Admitting: Internal Medicine

## 2018-05-23 VITALS — BP 120/70 | HR 72 | Ht 64.0 in | Wt 165.0 lb

## 2018-05-23 DIAGNOSIS — E1142 Type 2 diabetes mellitus with diabetic polyneuropathy: Secondary | ICD-10-CM

## 2018-05-23 DIAGNOSIS — E785 Hyperlipidemia, unspecified: Secondary | ICD-10-CM

## 2018-05-23 DIAGNOSIS — Z794 Long term (current) use of insulin: Secondary | ICD-10-CM | POA: Diagnosis not present

## 2018-05-23 DIAGNOSIS — E11319 Type 2 diabetes mellitus with unspecified diabetic retinopathy without macular edema: Secondary | ICD-10-CM

## 2018-05-23 LAB — POCT GLYCOSYLATED HEMOGLOBIN (HGB A1C): Hemoglobin A1C: 6.4 % — AB (ref 4.0–5.6)

## 2018-05-23 NOTE — Addendum Note (Signed)
Addended by: Cardell Peach I on: 05/23/2018 03:03 PM   Modules accepted: Orders

## 2018-06-10 ENCOUNTER — Other Ambulatory Visit: Payer: Self-pay | Admitting: Rheumatology

## 2018-06-14 ENCOUNTER — Other Ambulatory Visit: Payer: Self-pay | Admitting: Physician Assistant

## 2018-06-14 NOTE — Telephone Encounter (Signed)
Last Visit: 04/16/18 Next visit: 10/15/18  Okay to refill Ambien? 

## 2018-06-29 ENCOUNTER — Other Ambulatory Visit: Payer: Self-pay | Admitting: Internal Medicine

## 2018-07-10 ENCOUNTER — Ambulatory Visit: Payer: Medicare Other | Admitting: Family

## 2018-07-10 ENCOUNTER — Other Ambulatory Visit: Payer: Medicare Other

## 2018-07-12 ENCOUNTER — Other Ambulatory Visit: Payer: Self-pay | Admitting: Physician Assistant

## 2018-07-12 NOTE — Telephone Encounter (Signed)
Last Visit: 04/16/18 Next visit: 10/15/18  Okay to refill Ambien?

## 2018-08-01 ENCOUNTER — Other Ambulatory Visit: Payer: Self-pay | Admitting: Family Medicine

## 2018-08-01 ENCOUNTER — Other Ambulatory Visit: Payer: Self-pay | Admitting: Internal Medicine

## 2018-08-01 DIAGNOSIS — F329 Major depressive disorder, single episode, unspecified: Secondary | ICD-10-CM

## 2018-08-01 DIAGNOSIS — F419 Anxiety disorder, unspecified: Principal | ICD-10-CM

## 2018-08-02 NOTE — Telephone Encounter (Signed)
I have received a refill request for patient's metoprolol and Cymbalta.  She has an appointment first week in May on her chronic medical conditions. -If she does not have enough medicine to last her till her appointment, we can get her scheduled virtually sooner so that she does not run out of her medications. -She has enough medicines and would prefer to wait till her appointment that is okay to as long as she has enough medicines.  Attempt to schedule her today if possible.

## 2018-08-02 NOTE — Telephone Encounter (Signed)
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 She could not do visit today but stated she may be able to move it to Monday. Pt will call back to schedule if she needs appt sooner.

## 2018-08-13 ENCOUNTER — Other Ambulatory Visit: Payer: Self-pay | Admitting: Physician Assistant

## 2018-08-13 ENCOUNTER — Ambulatory Visit: Payer: Medicare Other | Admitting: Family Medicine

## 2018-08-13 ENCOUNTER — Encounter: Payer: Self-pay | Admitting: Family Medicine

## 2018-08-13 ENCOUNTER — Ambulatory Visit (INDEPENDENT_AMBULATORY_CARE_PROVIDER_SITE_OTHER): Payer: Medicare Other | Admitting: Family Medicine

## 2018-08-13 ENCOUNTER — Other Ambulatory Visit: Payer: Self-pay

## 2018-08-13 VITALS — BP 125/73 | HR 64 | Ht 64.0 in | Wt 167.2 lb

## 2018-08-13 DIAGNOSIS — F419 Anxiety disorder, unspecified: Secondary | ICD-10-CM

## 2018-08-13 DIAGNOSIS — I10 Essential (primary) hypertension: Secondary | ICD-10-CM | POA: Diagnosis not present

## 2018-08-13 DIAGNOSIS — F329 Major depressive disorder, single episode, unspecified: Secondary | ICD-10-CM | POA: Diagnosis not present

## 2018-08-13 MED ORDER — LISINOPRIL 2.5 MG PO TABS: 2.5000 mg | ORAL_TABLET | Freq: Every day | ORAL | 1 refills | Status: DC

## 2018-08-13 MED ORDER — METOPROLOL TARTRATE 25 MG PO TABS: 25.0000 mg | ORAL_TABLET | Freq: Two times a day (BID) | ORAL | 1 refills | Status: DC

## 2018-08-13 MED ORDER — DULOXETINE HCL 60 MG PO CPEP: ORAL_CAPSULE | ORAL | 1 refills | Status: DC

## 2018-08-13 MED ORDER — DULOXETINE HCL 30 MG PO CPEP: ORAL_CAPSULE | ORAL | 1 refills | Status: DC

## 2018-08-13 NOTE — Progress Notes (Signed)
VIRTUAL VISIT VIA VIDEO  I connected with Priscilla [name] on 08/13/18 at 11:00 AM EDT by a video enabled telemedicine application and verified that I am speaking with the correct person using two identifiers. Location patient: Home Location provider: [facility], Office Persons participating in the virtual visit: Patient, Dr. Raoul Pitch and R.Baker, LPN  I discussed the limitations of evaluation and management by telemedicine and the availability of in person appointments. The patient expressed understanding and agreed to proceed.   SUBJECTIVE Chief Complaint  Patient presents with  . Hypertension    Needs refills on all medications. Pt is doin well with no complaints   . Anxiety  . Depression    HPI:  Hypertension/hyperlipidemia: Pt reports  compliance with metoprolol BID and lisinopril 2.5 mg daily. Blood pressures ranges at home are not checked. Patient denies chest pain, shortness of breath, dizziness or lower extremity edema.   Pt takes a daily baby ASA. Pt is taking pravastatin. BMP: 02/21/2018 GFR NL CBC: 01/08/2018 within normal limits Lipids: 02/11/2018 good Tsh: 04/25/2017 WNL Diet: low sodium.  Exercise: routinely.  RF: HTN, HLD, DM, FHx HD  Peripheral neuropathy/anxiety: Patient reports she is doing very well on Cymbalta grams total dose.  She does feel it is helping. A1c 6.4 05/23/2018. Vit d and b12 levels normal 01/2018.  ROS: See pertinent positives and negatives per HPI.  Patient Active Problem List   Diagnosis Date Noted  . Type 2 diabetes mellitus with retinopathy, with long-term current use of insulin ([place]) 05/17/2018  . Overweight (BMI 25.0-29.9) 02/11/2018  . Vitamin D deficiency 10/13/2016  . 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 Iron deficiency anemia 04/28/2016  . Other insomnia 03/14/2016  . DDD (degenerative disc disease), cervical 03/14/2016  . Spondylosis without myelopathy or radiculopathy,  lumbar region 01/31/2016  . Medicare annual wellness visit, initial 08/06/2015  . Anxiety and depression 06/25/2014  . Psoriatic arthritis - sees rheumatologist 02/20/2014  . Asthma 12/14/2008  . Diabetic peripheral neuropathy associated with type 2 diabetes mellitus ([place]) 08/06/2008  . Hyperlipemia 11/12/2006  . Essential hypertension 11/12/2006    Social History   Tobacco Use  . Smoking status: Never Smoker  . Smokeless tobacco: Never Used  Substance Use Topics  . Alcohol use: Yes    Comment: glass of wine-special occasion    Current Outpatient Medications:  .  albuterol (PROVENTIL HFA;VENTOLIN HFA) 108 (90 Base) MCG/ACT inhaler, Inhale 2 puffs into the lungs every 6 (six) hours as needed for wheezing or shortness of breath., Disp: 1 Inhaler, Rfl: 1 .  aspirin 81 MG tablet, Take 81 mg by mouth daily.  , Disp: , Rfl:  .  Biotin 5000 MCG TABS, Take 5,000 mcg by mouth daily. , Disp: , Rfl:  .  calcium-vitamin D 250-100 MG-UNIT per tablet, Take 1 tablet by mouth daily. , Disp: , Rfl:  .  Cyanocobalamin (VITAMIN B12 PO), Place under the tongue., Disp: , Rfl:  .  DULoxetine (CYMBALTA) 30 MG capsule, TAKE 1 CAPSULE BY MOUTH EVERY DAY, Disp: 90 capsule, Rfl: 1 .  DULoxetine (CYMBALTA) 60 MG capsule, TAKE 1 CAPSULE BY MOUTH EVERY DAY, Disp: 90 capsule, Rfl: 1 .  fluticasone (FLONASE) 50 MCG/ACT nasal spray, Place 2 sprays into both nostrils daily., Disp: 16 g, Rfl: 6 .  glucagon (GLUCAGON EMERGENCY) 1 MG injection, Inject 1 mg into the muscle once as needed., Disp: 1 each, Rfl: 12 .  glucose blood (ONETOUCH VERIO)  test strip, USE TO TEST BLOOD SUGAR TWICE A DAY DX: E11.319, Disp: 200 each, Rfl: 0 .  insulin detemir (LEVEMIR) 100 UNIT/ML injection, Inject 0.3 mLs (30 Units total) into the skin at bedtime. DX: E11.319, Disp: 30 mL, Rfl: 3 .  Insulin Pen Needle 32G X 6 MM MISC, Use to inject Novolog 3 times daily DX: E11.319, Disp: 300 each, Rfl: 1 .  Insulin Syringe-Needle U-100 (B-D INS SYR  MICROFINE 1CC/27G) 27G X 5/8" 1 ML MISC, Use to inject levemir once daily DX: E11.319, Disp: 120 each, Rfl: 6 .  lisinopril (PRINIVIL,ZESTRIL) 2.5 MG tablet, Take 1 tablet (2.5 mg total) by mouth daily., Disp: 90 tablet, Rfl: 1 .  metFORMIN (GLUCOPHAGE) 1000 MG tablet, TAKE 1 TABLET BY MOUTH 2 TIMES DAILY WITH MEALS, Disp: 60 tablet, Rfl: 2 .  metoprolol tartrate (LOPRESSOR) 25 MG tablet, Take 1 tablet (25 mg total) by mouth 2 (two) times daily., Disp: 180 tablet, Rfl: 1 .  Multiple Vitamin (MULTIVITAMIN) tablet, Take 1 tablet by mouth daily. , Disp: , Rfl:  .  NOVOLOG FLEXPEN 100 UNIT/ML FlexPen, INJECT 6 TO 8 UNITS UNDER THE SKIN THREE TIMES DAILY WITH MEALS, PLUS SLIDING SCALE, Disp: 15 mL, Rfl: 0 .  ONE TOUCH LANCETS MISC, USE TO TEST BLOOD SUGAR TWICE A DAY DX: E11.319, Disp: 200 each, Rfl: 1 .  pravastatin (PRAVACHOL) 20 MG tablet, Take 1 tablet (20 mg total) by mouth daily., Disp: 90 tablet, Rfl: 3 .  tiZANidine (ZANAFLEX) 4 MG tablet, Take 1 tablet (4 mg total) by mouth at bedtime as needed for muscle spasms., Disp: 30 tablet, Rfl: 0 .  Vitamin D, Ergocalciferol, (DRISDOL) 50000 units CAPS capsule, TAKE 1 CAPSULE BY MOUTH every 7 DAYS, Disp: 15 capsule, Rfl: 3 .  zolpidem (AMBIEN) 5 MG tablet, TAKE 1 TABLET BY MOUTH AT BEDTIME as needed for insomnia, Disp: 30 tablet, Rfl: 0  No Known Allergies  OBJECTIVE: BP 125/73   Pulse 64   Ht 5\' 4"  (1.626 m)   Wt 167 lb 4 oz (75.9 kg)   LMP  (LMP Unknown)   BMI 28.71 kg/m  Gen: No acute distress. Nontoxic in appearance.  HENT: AT. [place].  MMM.  Eyes:Pupils Equal Round Reactive to light, Extraocular movements intact,  Conjunctiva without redness, discharge or icterus. CV: No edema Chest: Cough or shortness of breath not present Neuro:  Normal gait. Alert. Oriented x3  Psych: Normal affect, dress and demeanor. Normal speech. Normal thought content and judgment.  ASSESSMENT AND PLAN: Priscilla [name] is a 68 y.o. female present for  Essential  hypertension/palpitations/HLD -Stable.  Refills provided today on metoprolol, lisinopril and pravastatin. - Continue current regimen. - low sodium. Routine exercise.  - con't baby ASA - monitor for elevated pressures > 140/90 and return to clinic if routinely above goal.  - f/u 6 months.   Anxiety and depression/neuropathy -Stable.    Continue a total of 90 mg daily of Cymbalta.  Refills provided today. -Follow-up 6 months   type 2 diabetes mellitus with retinopathy, with long-term current use of insulin ([place]) Managed by endocrinology, last A1c looked good at 6.4 in February.  > 15 minutes spent with patient, >50% of time spent face to face    Howard Pouch, DO 08/13/2018

## 2018-08-13 NOTE — Telephone Encounter (Signed)
Ok to refill.  Imprivata is not working.

## 2018-08-13 NOTE — Telephone Encounter (Signed)
Last Visit: 04/16/2018 Next Visit: 10/15/2018  Last fill: 07/15/2018  Okay to refill Lorrin Mais?

## 2018-08-14 ENCOUNTER — Encounter: Payer: Self-pay | Admitting: Family Medicine

## 2018-08-27 ENCOUNTER — Other Ambulatory Visit: Payer: Self-pay | Admitting: Family Medicine

## 2018-08-27 DIAGNOSIS — I10 Essential (primary) hypertension: Secondary | ICD-10-CM

## 2018-08-28 NOTE — Telephone Encounter (Signed)
Tried to contact pharmacy and they are closed. Will try again.

## 2018-08-28 NOTE — Telephone Encounter (Signed)
Pharmacy was called and they have medication on file with refills.

## 2018-08-28 NOTE — Telephone Encounter (Signed)
Received refill request for patient's lisinopril.  This was refilled for 6 months total after her office visit on May 5.  Please make sure pharmacy received medication refill.

## 2018-09-06 ENCOUNTER — Telehealth: Payer: Self-pay | Admitting: Family

## 2018-09-06 NOTE — Telephone Encounter (Signed)
Received call to resch patient appt to 6/11 at 0830

## 2018-09-10 ENCOUNTER — Other Ambulatory Visit: Payer: Self-pay | Admitting: Physician Assistant

## 2018-09-11 NOTE — Telephone Encounter (Signed)
Last Visit: 04/16/2018 Next Visit: 10/15/2018  Okay to refill Ambien?

## 2018-09-19 ENCOUNTER — Telehealth: Payer: Self-pay | Admitting: Family

## 2018-09-19 ENCOUNTER — Other Ambulatory Visit: Payer: Self-pay

## 2018-09-19 ENCOUNTER — Inpatient Hospital Stay: Payer: Medicare Other | Attending: Family

## 2018-09-19 ENCOUNTER — Inpatient Hospital Stay (HOSPITAL_BASED_OUTPATIENT_CLINIC_OR_DEPARTMENT_OTHER): Payer: Medicare Other | Admitting: Family

## 2018-09-19 DIAGNOSIS — D510 Vitamin B12 deficiency anemia due to intrinsic factor deficiency: Secondary | ICD-10-CM

## 2018-09-19 DIAGNOSIS — Z9884 Bariatric surgery status: Secondary | ICD-10-CM

## 2018-09-19 DIAGNOSIS — Z794 Long term (current) use of insulin: Secondary | ICD-10-CM

## 2018-09-19 DIAGNOSIS — G629 Polyneuropathy, unspecified: Secondary | ICD-10-CM | POA: Insufficient documentation

## 2018-09-19 DIAGNOSIS — E559 Vitamin D deficiency, unspecified: Secondary | ICD-10-CM

## 2018-09-19 DIAGNOSIS — K909 Intestinal malabsorption, unspecified: Secondary | ICD-10-CM

## 2018-09-19 DIAGNOSIS — D508 Other iron deficiency anemias: Secondary | ICD-10-CM

## 2018-09-19 DIAGNOSIS — Z7982 Long term (current) use of aspirin: Secondary | ICD-10-CM | POA: Diagnosis not present

## 2018-09-19 DIAGNOSIS — Z79899 Other long term (current) drug therapy: Secondary | ICD-10-CM

## 2018-09-19 LAB — CBC WITH DIFFERENTIAL (CANCER CENTER ONLY)
Abs Immature Granulocytes: 0.02 10*3/uL (ref 0.00–0.07)
Basophils Absolute: 0.1 10*3/uL (ref 0.0–0.1)
Basophils Relative: 1 %
Eosinophils Absolute: 0.3 10*3/uL (ref 0.0–0.5)
Eosinophils Relative: 4 %
HCT: 42.4 % (ref 36.0–46.0)
Hemoglobin: 13.4 g/dL (ref 12.0–15.0)
Immature Granulocytes: 0 %
Lymphocytes Relative: 33 %
Lymphs Abs: 2.1 10*3/uL (ref 0.7–4.0)
MCH: 28.9 pg (ref 26.0–34.0)
MCHC: 31.6 g/dL (ref 30.0–36.0)
MCV: 91.6 fL (ref 80.0–100.0)
Monocytes Absolute: 0.6 10*3/uL (ref 0.1–1.0)
Monocytes Relative: 10 %
Neutro Abs: 3.2 10*3/uL (ref 1.7–7.7)
Neutrophils Relative %: 52 %
Platelet Count: 333 10*3/uL (ref 150–400)
RBC: 4.63 MIL/uL (ref 3.87–5.11)
RDW: 12.8 % (ref 11.5–15.5)
WBC Count: 6.2 10*3/uL (ref 4.0–10.5)
nRBC: 0 % (ref 0.0–0.2)

## 2018-09-19 LAB — IRON AND TIBC
Iron: 60 ug/dL (ref 41–142)
Saturation Ratios: 18 % — ABNORMAL LOW (ref 21–57)
TIBC: 335 ug/dL (ref 236–444)
UIBC: 275 ug/dL (ref 120–384)

## 2018-09-19 LAB — VITAMIN B12: Vitamin B-12: 1181 pg/mL — ABNORMAL HIGH (ref 180–914)

## 2018-09-19 LAB — FERRITIN: Ferritin: 26 ng/mL (ref 11–307)

## 2018-09-19 NOTE — Progress Notes (Signed)
Hematology and Oncology Follow Up Visit  SHAINDEL [name] [id number] 1950-07-04 68 y.o. 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 is here today for follow-up. She is doing well but still notes some fatigue.  She is staying busy caring for her sweet grandson. She also enjoys walking her dog for exercise. She will be glad when the gym opens back up.  She has had no episodes of bleeding, no bruising or petechiae.  No fever, chills, n/v, cough, rash, SOB, chest pain palpitations, abdominal pain or changes in bowel or bladder habits.  She will occasionally have dizziness if she stands too quickly.  No falls or syncopal episodes to report.  No swelling or tenderness in her extremities.  The neuropathy in her feet is unchanged.  No lymphadenopathy noted on exam.  He has maintained a good appetite and is staying well hydrated. Her weight is stable.   ECOG Performance Status: 1 - Symptomatic but completely ambulatory  Medications:  Allergies as of 09/19/2018   No Known Allergies     Medication List       Accurate as of September 19, 2018  8:55 AM. If you have any questions, ask your nurse or doctor.        albuterol 108 (90 Base) MCG/ACT inhaler Commonly known as: VENTOLIN HFA Inhale 2 puffs into the lungs every 6 (six) hours as needed for wheezing or shortness of breath.   aspirin 81 MG tablet Take 81 mg by mouth daily.   Biotin 5000 MCG Tabs Take 5,000 mcg by mouth daily.   calcium-vitamin D 250-100 MG-UNIT tablet Take 1 tablet by mouth daily.   DULoxetine 60 MG capsule Commonly known as: CYMBALTA TAKE 1 CAPSULE BY MOUTH EVERY DAY   DULoxetine 30 MG capsule Commonly known as: CYMBALTA TAKE 1 CAPSULE BY MOUTH EVERY DAY   fluticasone 50 MCG/ACT nasal spray Commonly known as: FLONASE Place 2 sprays into both nostrils daily.    glucagon 1 MG injection Commonly known as: Glucagon Emergency Inject 1 mg into the muscle once as needed.   glucose blood test strip Commonly known as: OneTouch Verio USE TO TEST BLOOD SUGAR TWICE A DAY DX: E11.319   insulin detemir 100 UNIT/ML injection Commonly known as: Levemir Inject 0.3 mLs (30 Units total) into the skin at bedtime. DX: E11.319   Insulin Pen Needle 32G X 6 MM Misc Use to inject Novolog 3 times daily DX: E11.319   Insulin Syringe-Needle U-100 27G X 5/8" 1 ML Misc Commonly known as: B-D INS SYR MICROFINE 1CC/27G Use to inject levemir once daily DX: E11.319   lisinopril 2.5 MG tablet Commonly known as: ZESTRIL Take 1 tablet (2.5 mg total) by mouth daily.   metFORMIN 1000 MG tablet Commonly known as: GLUCOPHAGE TAKE 1 TABLET BY MOUTH 2 TIMES DAILY WITH MEALS   metoprolol tartrate 25 MG tablet Commonly known as: LOPRESSOR Take 1 tablet (25 mg total) by mouth 2 (two) times daily.   multivitamin tablet Take 1 tablet by mouth daily.   NovoLOG FlexPen 100 UNIT/ML FlexPen Generic drug: insulin aspart INJECT 6 TO 8 UNITS UNDER THE SKIN THREE TIMES DAILY WITH MEALS, PLUS SLIDING SCALE   ONE TOUCH LANCETS Misc USE TO TEST BLOOD SUGAR TWICE A DAY DX: E11.319   pravastatin 20 MG tablet Commonly known as: PRAVACHOL Take 1 tablet (20 mg  total) by mouth daily.   tiZANidine 4 MG tablet Commonly known as: Zanaflex Take 1 tablet (4 mg total) by mouth at bedtime as needed for muscle spasms.   VITAMIN B12 PO Place under the tongue.   Vitamin D (Ergocalciferol) 1.25 MG (50000 UT) Caps capsule Commonly known as: DRISDOL TAKE 1 CAPSULE BY MOUTH every 7 DAYS   zolpidem 5 MG tablet Commonly known as: AMBIEN TAKE 1 TABLET BY MOUTH AT BEDTIME as needed for insomnia       Allergies: No Known Allergies  Past Medical History, Surgical history, Social history, and Family History were reviewed and updated.  Review of Systems: All other 10 point review of  systems is negative.   Physical Exam:  vitals were not taken for this visit.   Wt Readings from Last 3 Encounters:  08/13/18 167 lb 4 oz (75.9 kg)  05/23/18 165 lb (74.8 kg)  05/17/18 163 lb 12.8 oz (74.3 kg)    Ocular: Sclerae unicteric, pupils equal, round and reactive to light Ear-nose-throat: Oropharynx clear, dentition fair Lymphatic: No cervical or supraclavicular adenopathy Lungs no rales or rhonchi, good excursion bilaterally Heart regular rate and rhythm, no murmur appreciated Abd soft, nontender, positive bowel sounds, no liver or spleen tip aplpated on exam, no fluid wave  MSK no focal spinal tenderness, no joint edema Neuro: non-focal, well-oriented, appropriate affect Breasts: Deferred   Lab Results  Component Value Date   WBC 6.2 09/19/2018   HGB 13.4 09/19/2018   HCT 42.4 09/19/2018   MCV 91.6 09/19/2018   PLT 333 09/19/2018   Lab Results  Component Value Date   FERRITIN 39 01/08/2018   IRON 75 01/08/2018   TIBC 309 01/08/2018   UIBC 234 01/08/2018   IRONPCTSAT 24 01/08/2018   Lab Results  Component Value Date   RBC 4.63 09/19/2018   No results found for: KPAFRELGTCHN, LAMBDASER, KAPLAMBRATIO No results found for: IGGSERUM, IGA, IGMSERUM No results found for: Odetta Pink, SPEI   Chemistry      Component Value Date/Time   NA 139 02/11/2018 0830   NA 141 10/13/2016 1503   K 5.0 02/11/2018 0830   K 4.9 10/13/2016 1503   CL 101 02/11/2018 0830   CL 103 10/13/2016 1503   CO2 31 02/11/2018 0830   CO2 28 10/13/2016 1503   BUN 15 02/11/2018 0830   BUN 16 10/13/2016 1503   CREATININE 0.74 02/11/2018 0830   CREATININE 0.90 10/13/2016 1503      Component Value Date/Time   CALCIUM 9.4 02/11/2018 0830   CALCIUM 9.5 10/13/2016 1503   ALKPHOS 53 02/11/2018 0830   ALKPHOS 72 10/13/2016 1503   AST 14 02/11/2018 0830   AST 14 10/13/2016 1503   ALT 13 02/11/2018 0830   ALT 20 10/13/2016 1503    BILITOT 0.4 02/11/2018 0830   BILITOT 0.2 10/13/2016 1503       Impression and Plan: Priscilla [name] is a very pleasant 68 yo caucasian female with iron deficiency anemia secondary to malabsorption after lap band surgery.  We will see what her iron studies show and bring her back in for infusion if needed.  If these remain stable we will have her come back in 8 months for follow-up.  She will contact our office with any questions or concerns. We can certainly see her sooner if need be.   Laverna Peace, NP 6/11/20208:55 AM

## 2018-09-19 NOTE — Telephone Encounter (Signed)
Appointments scheduled per 6/11 los

## 2018-09-20 ENCOUNTER — Ambulatory Visit (INDEPENDENT_AMBULATORY_CARE_PROVIDER_SITE_OTHER): Payer: Medicare Other

## 2018-09-20 ENCOUNTER — Other Ambulatory Visit: Payer: Self-pay

## 2018-09-20 DIAGNOSIS — E11319 Type 2 diabetes mellitus with unspecified diabetic retinopathy without macular edema: Secondary | ICD-10-CM

## 2018-09-20 DIAGNOSIS — Z794 Long term (current) use of insulin: Secondary | ICD-10-CM | POA: Diagnosis not present

## 2018-09-20 LAB — VITAMIN D 25 HYDROXY (VIT D DEFICIENCY, FRACTURES): Vit D, 25-Hydroxy: 75.9 ng/mL (ref 30.0–100.0)

## 2018-09-20 LAB — POCT GLYCOSYLATED HEMOGLOBIN (HGB A1C): Hemoglobin A1C: 6.1 % — AB (ref 4.0–5.6)

## 2018-09-20 NOTE — Progress Notes (Signed)
Patient here for A1c test.

## 2018-09-23 ENCOUNTER — Other Ambulatory Visit: Payer: Self-pay | Admitting: Family Medicine

## 2018-09-23 DIAGNOSIS — F32A Depression, unspecified: Secondary | ICD-10-CM

## 2018-09-23 DIAGNOSIS — F329 Major depressive disorder, single episode, unspecified: Secondary | ICD-10-CM

## 2018-09-23 NOTE — Telephone Encounter (Signed)
Please call patient. I have received refill request for her 30 mg Cymbalta medication.  This was refilled last month when I saw her and she had 6 months of medication called in for her at that time.  Please make sure pharmacy received medication.  Please make sure patient is calling in the medication and not an old prescription number thank you.

## 2018-09-23 NOTE — Telephone Encounter (Signed)
Pt requested old RX number which cannot be deleted from the system. Pt has refills and this has been filled

## 2018-09-24 ENCOUNTER — Ambulatory Visit (INDEPENDENT_AMBULATORY_CARE_PROVIDER_SITE_OTHER): Payer: Medicare Other | Admitting: Internal Medicine

## 2018-09-24 ENCOUNTER — Telehealth: Payer: Self-pay | Admitting: Family

## 2018-09-24 ENCOUNTER — Other Ambulatory Visit: Payer: Self-pay

## 2018-09-24 ENCOUNTER — Encounter: Payer: Self-pay | Admitting: Internal Medicine

## 2018-09-24 DIAGNOSIS — E1142 Type 2 diabetes mellitus with diabetic polyneuropathy: Secondary | ICD-10-CM

## 2018-09-24 DIAGNOSIS — Z794 Long term (current) use of insulin: Secondary | ICD-10-CM

## 2018-09-24 DIAGNOSIS — E785 Hyperlipidemia, unspecified: Secondary | ICD-10-CM

## 2018-09-24 DIAGNOSIS — E11319 Type 2 diabetes mellitus with unspecified diabetic retinopathy without macular edema: Secondary | ICD-10-CM | POA: Diagnosis not present

## 2018-09-24 NOTE — Progress Notes (Signed)
Patient ID: Priscilla [name], female   DOB: 1950/12/30, 68 y.o.   MRN: [id number]  Patient location: Home My location: Office  Referring Provider: Ma Hillock, DO  I connected with the patient on 09/24/18 at 10:24 AM EDT by a video enabled telemedicine application and verified that I am speaking with the correct person.   I discussed the limitations of evaluation and management by telemedicine and the availability of in person appointments. The patient expressed understanding and agreed to proceed.   Details of the encounter are shown below.  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 She previously saw Drs Dwyane Dee (distant past) and [place] (more recently). Last visit with me 4 months ago.  Last hemoglobin A1c was: Lab Results  Component Value Date   HGBA1C 6.1 (A) 09/20/2018   HGBA1C 6.4 (A) 05/23/2018   HGBA1C 6.0 (A) 10/17/2017  03/16/2017: HbA1c calculated from fructosamine: 5.6% 10/18/2015: HbA1c calculated from fructosamine: 5.7% 02/2015: HbA1c 7.4%, HbA1c calculated from fructosamine: 5.6%  She is on: Please continue: - Metformin 1000 mg 2x a day with meals - Levemir 30 units at night - Mealtime Novolog:  -4-6 units before a smaller meal -8-10 units before a regular meal -12-15 units before a large meal  - Sliding scale Novolog: - 151-175: + 1 unit  - 176-200: + 2 units  - 201-225: + 3 units  - 226-250: + 4 units  - >250: + 5 units If sugars before the meal are 60 or lower, please do not take the Novolog dose. If sugars before the meal are 61-80, take only half of the Novolog dose. She was on Victoza 2.5 mg daily >> stopped as she could not afford this.  Pt checks her sugars 2-3x a day: - am:  82-130 >> [phone] >> 100-130 - 2h after b'fast: 96 >> 120, 185 >> 109-178 >> 120s - before lunch: 79-119 >> 120-123 >> 95-139 >>  n/c - 2h after lunch:  120-135 >> 83-123 >> 130s - before dinner: 106-150 >> 71, 112,148-247 >> 100-130 - 2h after dinner: 102-165 >> 140-170, 229 >> 170s, occas.220 - bedtime: 106-160 >> 108-156, 200 >> 58 (took insulin, could not eat) - nighttime: 46-70 >> 50 (?) >> 50s x1 (? Cause) Lowest sugar was 46 >> 50-at night >> 50s; she has hypoglycemia awareness in the 70s. Highest sugar was 285 x1 (cake) >> 247.  Glucometer: Freestyle  -No CKD last BUN/creatinine:  Lab Results  Component Value Date   BUN 15 02/11/2018   CREATININE 0.74 02/11/2018  ACR (12/2014): 6.9 On lisinopril 1.25 mg daily -+ HL; last set of lipids: Lab Results  Component Value Date   CHOL 187 02/11/2018   HDL 61.40 02/11/2018   LDLCALC 107 (H) 02/11/2018   TRIG 92.0 02/11/2018   CHOLHDL 3 02/11/2018  On pravastatin. - last eye exam was in 09/2017: No DR. 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  She has floaters.  She has another appointment coming up this month. - + Numbness and tingling in her feet, + also burning.  Takes a B12 supplement.  I recommended alpha lipoic acid but she did not take this due to the large size of the pill.  Last TSH reviewed, and this was normal: Lab Results  Component Value Date   TSH 2.08 04/25/2017   She  also has a history of lap band surgery >> she does not eat large meals but she usually grazes, especially at night.  ROS: Constitutional: no weight gain/no weight loss, + fatigue, no subjective hyperthermia, no subjective hypothermia Eyes: no blurry vision, no xerophthalmia ENT: no sore throat, no nodules palpated in neck, no dysphagia, no odynophagia, no hoarseness Cardiovascular: no CP/no SOB/no palpitations/no leg swelling Respiratory: no cough/no SOB/no wheezing Gastrointestinal: no N/no V/no D/no C/no acid reflux Musculoskeletal: no muscle aches/no joint aches Skin: no rashes, no hair loss Neurological: no tremors/+  numbness/+ tingling/no dizziness  I reviewed pt's medications, allergies, PMH, social hx, family hx, and changes were documented in the history of present illness. Otherwise, unchanged from my initial visit note.  Past Medical History:  Diagnosis Date  . Anemia 12/14/2008  . Anxiety and depression 12/14/2008  . Asthma 11/12/2006  . CAD (coronary artery disease) 12/14/2008   hx transluminal coronary angioplasty  . Cataract   . Depression   . Diabetes mellitus with neuropathy ([place]) 12/14/2008  . Fibromyalgia 01/04/2007   sees rheuamtology  . 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 Hyperlipemia 01/21/2010  . Hypertension 12/14/2008  . Leg edema 12/14/2008  . 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 OSA (obstructive sleep apnea) 08/06/2008  . Osteoarthritis 12/14/2008  . Palpitations 11/12/2006   hx sinus tachy  . Psoriatic arthritis [facility])    sees rheumatology  . Rotator cuff injury    Past Surgical History:  Procedure Laterality Date  . ABDOMINAL HYSTERECTOMY    . CHOLECYSTECTOMY    . LAPAROSCOPIC GASTRIC BANDING    . PTCA    . WRIST SURGERY     LEFT   Social History   Social History  . Marital Status: Married    Spouse Name: N/A  . Number of Children: N/A   Occupational History  . Not on file.   Social History Main Topics  . Smoking status: Never Smoker   . Smokeless tobacco: Never Used  . Alcohol Use: Yes     Comment: glass of wine-specially occasion  . Drug Use: No   Current Outpatient Medications on File Prior to Visit  Medication Sig Dispense Refill  . albuterol (PROVENTIL HFA;VENTOLIN HFA) 108 (90 Base) MCG/ACT inhaler Inhale 2 puffs into the lungs every 6 (six) hours as needed for wheezing or shortness of breath. 1 Inhaler 1  . aspirin 81 MG tablet Take 81 mg by mouth daily.      . Biotin 5000 MCG TABS Take 5,000 mcg by mouth daily.     . calcium-vitamin D 250-100 MG-UNIT per tablet Take 1 tablet by mouth daily.     . Cyanocobalamin (VITAMIN B12 PO) Place under the  tongue.    . DULoxetine (CYMBALTA) 30 MG capsule TAKE 1 CAPSULE BY MOUTH EVERY DAY 90 capsule 1  . DULoxetine (CYMBALTA) 60 MG capsule TAKE 1 CAPSULE BY MOUTH EVERY DAY 90 capsule 1  . fluticasone (FLONASE) 50 MCG/ACT nasal spray Place 2 sprays into both nostrils daily. 16 g 6  . glucagon (GLUCAGON EMERGENCY) 1 MG injection Inject 1 mg into the muscle once as needed. 1 each 12  . glucose blood (ONETOUCH VERIO) test strip USE TO TEST BLOOD SUGAR TWICE A DAY DX: E11.319 200 each 0  . insulin detemir (LEVEMIR) 100 UNIT/ML injection Inject 0.3 mLs (30 Units total) into the skin at bedtime. DX: E11.319 30 mL 3  . Insulin Pen Needle 32G X 6 MM MISC Use to inject Novolog 3  times daily DX: E11.319 300 each 1  . Insulin Syringe-Needle U-100 (B-D INS SYR MICROFINE 1CC/27G) 27G X 5/8" 1 ML MISC Use to inject levemir once daily DX: E11.319 120 each 6  . lisinopril (ZESTRIL) 2.5 MG tablet Take 1 tablet (2.5 mg total) by mouth daily. 90 tablet 1  . metFORMIN (GLUCOPHAGE) 1000 MG tablet TAKE 1 TABLET BY MOUTH 2 TIMES DAILY WITH MEALS 60 tablet 2  . metoprolol tartrate (LOPRESSOR) 25 MG tablet Take 1 tablet (25 mg total) by mouth 2 (two) times daily. 180 tablet 1  . Multiple Vitamin (MULTIVITAMIN) tablet Take 1 tablet by mouth daily.     Marland Kitchen NOVOLOG FLEXPEN 100 UNIT/ML FlexPen INJECT 6 TO 8 UNITS UNDER THE SKIN THREE TIMES DAILY WITH MEALS, PLUS SLIDING SCALE 15 mL 0  . ONE TOUCH LANCETS MISC USE TO TEST BLOOD SUGAR TWICE A DAY DX: E11.319 200 each 1  . pravastatin (PRAVACHOL) 20 MG tablet Take 1 tablet (20 mg total) by mouth daily. 90 tablet 3  . tiZANidine (ZANAFLEX) 4 MG tablet Take 1 tablet (4 mg total) by mouth at bedtime as needed for muscle spasms. 30 tablet 0  . Vitamin D, Ergocalciferol, (DRISDOL) 50000 units CAPS capsule TAKE 1 CAPSULE BY MOUTH every 7 DAYS 15 capsule 3  . zolpidem (AMBIEN) 5 MG tablet TAKE 1 TABLET BY MOUTH AT BEDTIME as needed for insomnia 30 tablet 0   No current facility-administered  medications on file prior to visit.    No Known Allergies Family History  Problem Relation Age of Onset  . Arthritis Mother   . Alzheimer's disease Mother   . Stroke Father   . Alcohol abuse Father   . Diabetes Father   . Heart disease Father   . Early death Father 87  . Diabetes Sister   . Arthritis Sister   . COPD Sister   . Arthritis Maternal Aunt   . Diabetes Maternal Aunt   . Early death Maternal Aunt 43  . Diabetes Maternal Uncle   . Cancer Paternal Aunt   . COPD Paternal 67   . Heart disease Paternal Aunt   . Heart disease Paternal Uncle   . Alcohol abuse Paternal Grandfather   . Heart disease Paternal Grandfather   . Early death Paternal Grandfather 47  . Stroke Paternal Grandfather   . Hypertension Son   . Autoimmune disease Daughter   . Breast cancer Neg Hx    PE: LMP  (LMP Unknown)  There is no height or weight on file to calculate BMI. Wt Readings from Last 3 Encounters:  08/13/18 167 lb 4 oz (75.9 kg)  05/23/18 165 lb (74.8 kg)  05/17/18 163 lb 12.8 oz (74.3 kg)   Constitutional:  in NAD  The physical exam was not performed (virtual visit).   ASSESSMENT: 1. DM2, insulin-dependent, uncontrolled, with complications - Diabetic retinopathy left eye - Peripheral neuropathy  2. HL  3. PN   PLAN:  1. Patient with longstanding, uncontrolled, type 2 diabetes, on metformin and basal/bolus insulin regimen, with still fairly good control.  Most of her sugars remain at goal, with the exception evening CBGs are higher.  We discussed that she may need to use a higher dose of NovoLog with dinner, but I would prefer to be less strict with her NovoLog in the evening since she occasionally can drop her sugars overnight without an explanation.  Since last visit, she had 1 low blood sugar in the 50s during the night, unclear why.  We did not make changes in her regimen but I advised her that if she has more low blood sugars at night, will need to decrease the dose of  Levemir at night.  She will let me know. -At last visit, sugars were higher after she was sick and also got steroid shot, with an HbA1c of 6.4%.  Few days ago, she had a repeat HbA1c and it was better, at 6.0%. -We discussed in the past that if her Levemir/NovoLog regimen becomes unaffordable, we may need to switch to NPH/regular insulin regimen, if she enters the donut hole. - I advised her to:  Patient Instructions  Please continue: - Metformin 1000 mg 2x a day with meals - Levemir 30 units at night - Mealtime Novolog:  -4-6 units before a smaller meal -8-10 units before a regular meal -12-15 units before a large meal  - Sliding scale Novolog: - 151-175: + 1 unit  - 176-200: + 2 units  - 201-225: + 3 units  - 226-250: + 4 units  - >250: + 5 units If sugars before the meal are 60 or lower, please do not take the Novolog dose. If sugars before the meal are 61-80, take only half of the Novolog dose.  Please come back for a follow-up appointment in 4 months.  - continue checking sugars at different times of the day - check 3-4x a day, rotating checks - advised for yearly eye exams >> she is UTD - Return to clinic in 4 mo with sugar log    2. HL - Reviewed latest lipid panel from 02/2018: LDL slightly above goal, rest of the fractions are at goal Lab Results  Component Value Date   CHOL 187 02/11/2018   HDL 61.40 02/11/2018   LDLCALC 107 (H) 02/11/2018   TRIG 92.0 02/11/2018   CHOLHDL 3 02/11/2018  - Continues the statin without side effects.  3. PN -Likely secondary to diabetes -Continues on B12 vitamin and Cymbalta -I again suggested alpha-lipoic acid but she could not take it since this was a large pill.  Philemon Kingdom, MD PhD [facility] Endocrinology

## 2018-09-24 NOTE — Telephone Encounter (Signed)
Called and lmvm for patient with date/time of appointment per 6/16 sch msg

## 2018-09-24 NOTE — Patient Instructions (Addendum)
Please continue: - Metformin 1000 mg 2x a day with meals - Levemir 30 units at night - Mealtime Novolog:  -4-6 units before a smaller meal -8-10 units before a regular meal -12-15 units before a large meal  - Sliding scale Novolog: - 151-175: + 1 unit  - 176-200: + 2 units  - 201-225: + 3 units  - 226-250: + 4 units  - >250: + 5 units If sugars before the meal are 60 or lower, please do not take the Novolog dose. If sugars before the meal are 61-80, take only half of the Novolog dose.  Please come back for a follow-up appointment in 4 months.

## 2018-09-27 ENCOUNTER — Other Ambulatory Visit: Payer: Self-pay

## 2018-09-27 ENCOUNTER — Inpatient Hospital Stay: Payer: Medicare Other

## 2018-09-27 VITALS — BP 134/95 | HR 65 | Temp 98.8°F | Resp 16

## 2018-09-27 DIAGNOSIS — K909 Intestinal malabsorption, unspecified: Secondary | ICD-10-CM | POA: Diagnosis not present

## 2018-09-27 DIAGNOSIS — D510 Vitamin B12 deficiency anemia due to intrinsic factor deficiency: Secondary | ICD-10-CM

## 2018-09-27 DIAGNOSIS — G629 Polyneuropathy, unspecified: Secondary | ICD-10-CM | POA: Diagnosis not present

## 2018-09-27 DIAGNOSIS — D508 Other iron deficiency anemias: Secondary | ICD-10-CM | POA: Diagnosis not present

## 2018-09-27 DIAGNOSIS — Z7982 Long term (current) use of aspirin: Secondary | ICD-10-CM | POA: Diagnosis not present

## 2018-09-27 DIAGNOSIS — Z9884 Bariatric surgery status: Secondary | ICD-10-CM | POA: Diagnosis not present

## 2018-09-27 DIAGNOSIS — Z79899 Other long term (current) drug therapy: Secondary | ICD-10-CM | POA: Diagnosis not present

## 2018-09-27 MED ORDER — SODIUM CHLORIDE 0.9 % IV SOLN
INTRAVENOUS | Status: DC
  Administered 2018-09-27: 09:00:00 via INTRAVENOUS
  Filled 2018-09-27: qty 250

## 2018-09-27 MED ORDER — SODIUM CHLORIDE 0.9 % IV SOLN
510.0000 mg | Freq: Once | INTRAVENOUS | Status: AC
  Administered 2018-09-27: 510 mg via INTRAVENOUS
  Filled 2018-09-27: qty 17

## 2018-09-27 NOTE — Patient Instructions (Signed)

## 2018-10-02 NOTE — Progress Notes (Signed)
Office Visit Note  Patient: Priscilla [name]             Date of Birth: Oct 13, 1950           MRN: [id number]             PCP: Ma Hillock, DO Referring: Ma Hillock, DO Visit Date: 10/15/2018 Occupation: @GUAROCC @  Subjective:  Trapezius muscle tension and tenderness   History of Present Illness: Priscilla [name] is a 68 y.o. female with history of psoriatic arthritis and fibromyalgia.  Her psoriatic arthritis continues to be in remission.  She has not had any recent flares.  She states that she is having some increased discomfort and stiffness in her right hand.  She denies any joint swelling.  She states about 2 weeks ago she threw a birthday party for her granddaughter and was cutting out about her shapes which she feels exacerbated her right hand pain.  She states that the pain has progressively been improving.  She denies any Achilles denies or plantar fasciitis.  She denies any active psoriasis at this time.  She denies any SI joint pain.  She does have chronic lower back pain due to stenosis.  She has chronic neck pain and trapezius muscle spasms and muscle tension bilaterally.  She tried taking Zanaflex but found it to be ineffective and is discontinued.  She has chronic fatigue but states that overall it is improved since retiring.  She continues to take Ambien 5 mg 1 tablet by mouth at bedtime to help her sleep at night.  She states she sleeps for 5 hours uninterrupted nightly.  She continues to take Cymbalta 30 mg by mouth in the morning and 60 mg by mouth in the evening.   Activities of Daily Living:  Patient reports morning stiffness for 30 minutes.   Patient Reports nocturnal pain.  Difficulty dressing/grooming: Denies Difficulty climbing stairs: Denies Difficulty getting out of chair: Denies Difficulty using hands for taps, buttons, cutlery, and/or writing: Reports  Review of Systems  Constitutional: Positive for fatigue.  HENT: Negative for mouth sores, mouth  dryness and nose dryness.   Eyes: Positive for dryness. Negative for pain and visual disturbance.  Respiratory: Negative for cough, hemoptysis, shortness of breath and difficulty breathing.   Cardiovascular: Negative for chest pain, palpitations, hypertension and swelling in legs/feet.  Gastrointestinal: Negative for blood in stool, constipation and diarrhea.  Endocrine: Negative for increased urination.  Genitourinary: Negative for painful urination.  Musculoskeletal: Positive for arthralgias, joint pain, joint swelling and morning stiffness. Negative for myalgias, muscle weakness, muscle tenderness and myalgias.  Skin: Positive for hair loss. Negative for color change, pallor, rash, nodules/bumps, skin tightness, ulcers and sensitivity to sunlight.  Allergic/Immunologic: Negative for susceptible to infections.  Neurological: Positive for numbness and weakness. Negative for dizziness and headaches.  Hematological: Negative for swollen glands.  Psychiatric/Behavioral: Positive for sleep disturbance. Negative for depressed mood. The patient is not nervous/anxious.     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   Gastric bypass status for obesity 08/31/2016   Iron deficiency anemia 04/28/2016   Other insomnia 03/14/2016   DDD (degenerative disc disease), cervical 03/14/2016   Spondylosis without myelopathy or radiculopathy, lumbar region 01/31/2016   Medicare annual wellness visit, initial  08/06/2015   Anxiety and depression 06/25/2014   Psoriatic arthritis - sees rheumatologist 02/20/2014   Asthma 12/14/2008   Diabetic peripheral neuropathy associated with type 2 diabetes mellitus ([place]) 08/06/2008   Hyperlipemia 11/12/2006   Essential  hypertension 11/12/2006    Past Medical History:  Diagnosis Date   Anemia 12/14/2008   Anxiety and depression 12/14/2008   Asthma 11/12/2006   CAD (coronary artery disease) 12/14/2008   hx transluminal coronary angioplasty   Cataract    Depression    Diabetes mellitus with neuropathy ([place]) 12/14/2008   Fibromyalgia 01/04/2007   sees rheuamtology   Gastric bypass status for obesity 08/31/2016   Hyperlipemia 01/21/2010   Hypertension 12/14/2008   Leg edema 12/14/2008   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   OSA (obstructive sleep apnea) 08/06/2008   Osteoarthritis 12/14/2008   Palpitations 11/12/2006   hx sinus tachy   Psoriatic arthritis ([place])    sees rheumatology   Rotator cuff injury     Family History  Problem Relation Age of Onset   Arthritis Mother    Alzheimer's disease Mother    Stroke Father    Alcohol abuse Father    Diabetes Father    Heart disease Father    Early death Father 99   Diabetes Sister    Arthritis Sister    COPD Sister    Arthritis Maternal Aunt    Diabetes Maternal Aunt    Early death Maternal Aunt 50   Diabetes Maternal Uncle    Cancer Paternal Aunt    COPD Paternal Aunt    Heart disease Paternal Aunt    Heart disease Paternal Uncle    Alcohol abuse Paternal Grandfather    Heart disease Paternal Grandfather    Early death Paternal Grandfather 73   Stroke Paternal Grandfather    Hypertension Son    Autoimmune disease Daughter    Breast cancer Neg Hx    Past Surgical History:  Procedure Laterality Date   ABDOMINAL HYSTERECTOMY     CHOLECYSTECTOMY     LAPAROSCOPIC GASTRIC BANDING     PTCA     WRIST SURGERY     LEFT   Social History   Social History Narrative   Married, [name]. 2 children name Abe People and Anderson Malta.   Some college, retired Engineer, manufacturing.   Drinks caffeine.   Take a daily vitamin.   Wears her seatbelt, exercises 3 times a week.   Smoke detector in the home.   Firearms in the  home.   Feels safe in her relationships.   Immunization History  Administered Date(s) Administered   Influenza Split 01/21/2013   Influenza, High Dose Seasonal PF 02/04/2015, 02/09/2016, 02/11/2018   Influenza,inj,Quad PF,6+ Mos 01/15/2014   Influenza-Unspecified 01/26/2017   Pneumococcal Conjugate-13 02/04/2015   Pneumococcal Polysaccharide-23 10/31/2013   Tdap 02/07/2013     Objective: Vital Signs: BP 105/73 (BP Location: Left Arm, Patient Position: Sitting, Cuff Size: Normal)    Pulse 79    Resp 14    Ht 5\' 4"  (1.626 m)    Wt 169 lb 6.4 oz (76.8 kg)    LMP  (LMP Unknown)    BMI 29.08 kg/m    Physical Exam Vitals signs and nursing note reviewed.  Constitutional:      Appearance: She is well-developed.  HENT:     Head: Normocephalic and atraumatic.  Eyes:     Conjunctiva/sclera: Conjunctivae normal.  Neck:     Musculoskeletal: Normal range of motion.  Cardiovascular:  Rate and Rhythm: Normal rate and regular rhythm.     Heart sounds: Normal heart sounds.  Pulmonary:     Effort: Pulmonary effort is normal.     Breath sounds: Normal breath sounds.  Abdominal:     General: Bowel sounds are normal.     Palpations: Abdomen is soft.  Lymphadenopathy:     Cervical: No cervical adenopathy.  Skin:    General: Skin is warm and dry.     Capillary Refill: Capillary refill takes less than 2 seconds.  Neurological:     Mental Status: She is alert and oriented to person, place, and time.  Psychiatric:        Behavior: Behavior normal.      Musculoskeletal Exam: C-spine limited range of motion.  Trapezius muscle tension and muscle tenderness bilaterally.  Thoracic and lumbar spine good range of motion.  No midline spinal tenderness.  No SI joint tenderness.  Shoulder joints, elbow joints, wrist joints, MCPs, PIPs, DIPs good range of motion no synovitis.  She has PIP and DIP synovial thickening consistent with osteoarthritis of bilateral hands.  She has bilateral CMC joint  synovial thickening.  Hip joints, knee joints, ankle joints, MTPs, PIPs and DIPs good range of motion no synovitis.  No warmth or effusion bilateral knee joints.  No tenderness or swelling of ankle joints.  No Achilles tendinitis or plantar fasciitis.  CDAI Exam: CDAI Score: -- Patient Global: --; Provider Global: -- Swollen: --; Tender: -- Joint Exam   No joint exam has been documented for this visit   There is currently no information documented on the homunculus. Go to the Rheumatology activity and complete the homunculus joint exam.  Investigation: No additional findings.  Imaging: No results found.  Recent Labs: Lab Results  Component Value Date   WBC 6.2 09/19/2018   HGB 13.4 09/19/2018   PLT 333 09/19/2018   NA 139 02/11/2018   K 5.0 02/11/2018   CL 101 02/11/2018   CO2 31 02/11/2018   GLUCOSE 93 02/11/2018   BUN 15 02/11/2018   CREATININE 0.74 02/11/2018   BILITOT 0.4 02/11/2018   ALKPHOS 53 02/11/2018   AST 14 02/11/2018   ALT 13 02/11/2018   PROT 6.7 02/11/2018   ALBUMIN 4.2 02/11/2018   CALCIUM 9.4 02/11/2018   GFRAA 77 10/13/2016    Speciality Comments: No specialty comments available.  Procedures:  No procedures performed Allergies: Patient has no known allergies.   Assessment / Plan:     Visit Diagnoses: Psoriatic arthritis ([place]) - In remission.  She is not on any immunosuppressive agents at this time.  She has no synovitis or dactylitis on exam.  She has no psoriasis at this time.  She has no Achilles tendinitis or plantar fasciitis.  She has no SI joint tenderness on exam.  She was advised to notify us if she develops increased joint pain or joint swelling.  She will follow-up in the office in 6 months.  Psoriasis - She has no psoriasis at this time.  Primary osteoarthritis of both hands -She has PIP and DIP synovial thickening consistent with osteoarthritis of bilateral hands.  She has bilateral CMC joint synovial thickening.  She has right CMC  joint tenderness on exam today.  About 2 weeks ago she threw her granddaughter birthday party and was cutting out objects which exacerbated her right hand pain.  We discussed using Voltaren gel topically as needed for pain relief.  Joint protection and muscle strengthening were discussed.  DDD (  degenerative disc disease), cervical - She has limited range of motion with discomfort.  She has chronic neck pain and stiffness.  She has trapezius muscle tension and muscle spasms.  She is given her a prescription for Robaxin 500 mg 1 tablet by mouth as needed for muscle spasms.  She declined trigger point injections today but was advised to notify us if she would like to schedule these injections in the future.  Fibromyalgia -She continues have generalized muscle aches and muscle tenderness due to fibromyalgia.  She experiences trapezius muscle spasms and muscle tension daily.  She has tried taking Zanaflex 4 mg by mouth at bedtime to help her muscle spasms but has found it ineffective and is discontinued.  She previously found Flexeril effective but discontinued due to being prescribed Ambien 5 mg by mouth at bedtime.  She is given a prescription for Robaxin 500 mg 1 tablet by mouth as needed for muscle spasms.  She was advised to notify us if Robaxin is effective.  She continues to have chronic fatigue related to insomnia.  A prescription refill of Ambien was sent to the pharmacy yesterday.  We discussed importance of regular exercise and good sleep hygiene.  She will continue taking Cymbalta 30 mg by mouth in the morning and 60 mg by mouth in the evening.  Spondylosis without myelopathy or radiculopathy, lumbar region - Chronic pain.  She has had facet joint injections in the past.  She has intermittent discomfort in her lower back especially if she lifts heavy objects.  Other insomnia - She takes Ambien 5 mg as needed at bedtime for sleep.  A refill of Ambien was sent to the pharmacy yesterday.  She has about 5  hours of uninterrupted sleep at night.  We discussed the importance of good sleep hygiene.  Other fatigue: Chronic.  Overall her fatigue has improved since retiring.  She continues to get iron infusions and follows up with Dr. Marin Olp on a regular basis.  History of scoliosis   Other medical conditions are listed as follows:   History of diabetes mellitus  History of carpal tunnel syndrome  History of depression   History of anxiety   History of sleep apnea   History of asthma   History of vitamin D deficiency   History of anemia: She had an iron infusion a few weeks ago.   History of hypertension   Gastric bypass status for obesity   Orders: No orders of the defined types were placed in this encounter.  Meds ordered this encounter  Medications   methocarbamol (ROBAXIN) 500 MG tablet    Sig: Take 1 tablet (500 mg total) by mouth daily as needed for muscle spasms.    Dispense:  30 tablet    Refill:  0    Face-to-face time spent with patient was 30 minutes. Greater than 50% of time was spent in counseling and coordination of care.  Follow-Up Instructions: Return in 6 months (on 04/17/2019) for Psoriatic arthritis, Fibromyalgia.   Ofilia Neas, PA-C   I examined and evaluated the patient with Hazel Sams PA.  She had no active psoriatic arthritis or psoriasis on my examination.  She continues to have some pain from underlying osteoarthritis and fibromyalgia.  The plan of care was discussed as noted above.  Bo Merino, MD  Note - This record has been created using Editor, commissioning.  Chart creation errors have been sought, but may not always  have been located. Such creation errors do not reflect  on  the standard of medical care.

## 2018-10-04 ENCOUNTER — Ambulatory Visit (INDEPENDENT_AMBULATORY_CARE_PROVIDER_SITE_OTHER): Payer: Medicare Other | Admitting: Family Medicine

## 2018-10-04 ENCOUNTER — Other Ambulatory Visit: Payer: Self-pay

## 2018-10-04 ENCOUNTER — Encounter: Payer: Self-pay | Admitting: Family Medicine

## 2018-10-04 VITALS — BP 108/69 | HR 73 | Temp 97.4°F | Resp 16 | Ht 64.0 in | Wt 166.4 lb

## 2018-10-04 DIAGNOSIS — E1142 Type 2 diabetes mellitus with diabetic polyneuropathy: Secondary | ICD-10-CM

## 2018-10-04 DIAGNOSIS — R300 Dysuria: Secondary | ICD-10-CM | POA: Diagnosis not present

## 2018-10-04 DIAGNOSIS — R350 Frequency of micturition: Secondary | ICD-10-CM | POA: Diagnosis not present

## 2018-10-04 LAB — POC URINALSYSI DIPSTICK (AUTOMATED)
Bilirubin, UA: NEGATIVE
Blood, UA: NEGATIVE
Glucose, UA: NEGATIVE
Ketones, UA: NEGATIVE
Leukocytes, UA: NEGATIVE
Nitrite, UA: NEGATIVE
Protein, UA: NEGATIVE
Spec Grav, UA: 1.015 (ref 1.010–1.025)
Urobilinogen, UA: 0.2 E.U./dL
pH, UA: 6 (ref 5.0–8.0)

## 2018-10-04 MED ORDER — CEPHALEXIN 500 MG PO CAPS: 500.0000 mg | ORAL_CAPSULE | Freq: Four times a day (QID) | ORAL | 0 refills | Status: DC

## 2018-10-04 NOTE — Progress Notes (Signed)
Priscilla [name] , 1951/01/18, 68 y.o., female MRN: [id number] Patient Care Team    Relationship Specialty Notifications Start End  Ma Hillock, DO PCP - General Family Medicine  07/19/15    Comment: transfer to [place], Tonna Corner, MD Consulting Physician Orthopedic Surgery  08/06/15   Philemon Kingdom, MD Consulting Physician Internal Medicine  08/06/15    Comment: endocrine  Mcarthur Rossetti, MD Consulting Physician Orthopedic Surgery  08/06/15   Bo Merino, MD Consulting Physician Rheumatology  08/06/15   Volanda Napoleon, MD Consulting Physician Oncology  11/10/16   Magnus Sinning, MD Consulting Physician Physical Medicine and Rehabilitation  11/10/16   Dermatology, [facility]    11/10/16    Comment: Dr. Martin[place]    Chief Complaint  Patient presents with  . Urinary Tract Infection    Burning with urination and urgency x3 days      Subjective: Pt presents for an OV with complaints of burning with urination, urinary frequency and urgency of 3 days duration.  She denies vaginal discharge, fever, chills, nausea or low back pain.  She states she has been trying to increase her water consumption. She does endorse eating more of the little tangerines recently.  Otherwise no other dietary changes she is aware of.  She has had E. coli UTIs in the past-resistant to Cipro.  Patient also is a diabetic and has flat feet.  She is interested in a referral to podiatry for her diabetic foot exams and consider orthotics.  Depression screen [facility] 2/9 08/13/2018 02/11/2018 11/27/2017 08/10/2017 04/25/2017  Decreased Interest 0 0 0 1 0  Down, Depressed, Hopeless 0 1 1 1 3   PHQ - 2 Score 0 1 1 2 3   Altered sleeping 0 0 1 1 0  Tired, decreased energy 1 1 1 2 3   Change in appetite 0 0 1 1 1   Feeling bad or failure about yourself  0 0 0 1 0  Trouble concentrating 0 0 0 1 0  Moving slowly or fidgety/restless 0 0 0 0 0  Suicidal thoughts 0 0 0 0 0  PHQ-9 Score 1 2 4 8 7   Difficult doing  work/chores Not difficult at all Not difficult at all Not difficult at all Not difficult at all Somewhat difficult  Some recent data might be hidden    No Known Allergies Social History   Social History Narrative   Married, [name]. 2 children name Abe People and Anderson Malta.   Some college, retired Engineer, manufacturing.   Drinks caffeine.   Take a daily vitamin.   Wears her seatbelt, exercises 3 times a week.   Smoke detector in the home.   Firearms in the home.   Feels safe in her relationships.   Past Medical History:  Diagnosis Date  . Anemia 12/14/2008  . Anxiety and depression 12/14/2008  . Asthma 11/12/2006  . CAD (coronary artery disease) 12/14/2008   hx transluminal coronary angioplasty  . Cataract   . Depression   . Diabetes mellitus with neuropathy ([place]) 12/14/2008  . Fibromyalgia 01/04/2007   sees rheuamtology  . 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 Hyperlipemia 01/21/2010  . Hypertension 12/14/2008  . Leg edema 12/14/2008  . 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 OSA (obstructive sleep apnea) 08/06/2008  . Osteoarthritis 12/14/2008  . Palpitations 11/12/2006   hx sinus tachy  . Psoriatic arthritis [facility])    sees rheumatology  . Rotator cuff injury    Past Surgical History:  Procedure  Laterality Date  . ABDOMINAL HYSTERECTOMY    . CHOLECYSTECTOMY    . LAPAROSCOPIC GASTRIC BANDING    . PTCA    . WRIST SURGERY     LEFT   Family History  Problem Relation Age of Onset  . Arthritis Mother   . Alzheimer's disease Mother   . Stroke Father   . Alcohol abuse Father   . Diabetes Father   . Heart disease Father   . Early death Father 75  . Diabetes Sister   . Arthritis Sister   . COPD Sister   . Arthritis Maternal Aunt   . Diabetes Maternal Aunt   . Early death Maternal Aunt 38  . Diabetes Maternal Uncle   . Cancer Paternal Aunt   . COPD Paternal 28   . Heart disease Paternal Aunt   . Heart disease Paternal Uncle   . Alcohol abuse Paternal Grandfather   .  Heart disease Paternal Grandfather   . Early death Paternal Grandfather 59  . Stroke Paternal Grandfather   . Hypertension Son   . Autoimmune disease Daughter   . Breast cancer Neg Hx    Allergies as of 10/04/2018   No Known Allergies     Medication List       Accurate as of October 04, 2018 11:59 AM. If you have any questions, ask your nurse or doctor.        albuterol 108 (90 Base) MCG/ACT inhaler Commonly known as: VENTOLIN HFA Inhale 2 puffs into the lungs every 6 (six) hours as needed for wheezing or shortness of breath.   aspirin 81 MG tablet Take 81 mg by mouth daily.   Biotin 5000 MCG Tabs Take 5,000 mcg by mouth daily.   calcium-vitamin D 250-100 MG-UNIT tablet Take 1 tablet by mouth daily.   DULoxetine 60 MG capsule Commonly known as: CYMBALTA TAKE 1 CAPSULE BY MOUTH EVERY DAY   DULoxetine 30 MG capsule Commonly known as: CYMBALTA TAKE 1 CAPSULE BY MOUTH EVERY DAY   fluticasone 50 MCG/ACT nasal spray Commonly known as: FLONASE Place 2 sprays into both nostrils daily.   glucagon 1 MG injection Commonly known as: Glucagon Emergency Inject 1 mg into the muscle once as needed.   glucose blood test strip Commonly known as: OneTouch Verio USE TO TEST BLOOD SUGAR TWICE A DAY DX: E11.319   insulin detemir 100 UNIT/ML injection Commonly known as: Levemir Inject 0.3 mLs (30 Units total) into the skin at bedtime. DX: E11.319   Insulin Pen Needle 32G X 6 MM Misc Use to inject Novolog 3 times daily DX: E11.319   Insulin Syringe-Needle U-100 27G X 5/8" 1 ML Misc Commonly known as: B-D INS SYR MICROFINE 1CC/27G Use to inject levemir once daily DX: E11.319   lisinopril 2.5 MG tablet Commonly known as: ZESTRIL Take 1 tablet (2.5 mg total) by mouth daily.   metFORMIN 1000 MG tablet Commonly known as: GLUCOPHAGE TAKE 1 TABLET BY MOUTH 2 TIMES DAILY WITH MEALS   metoprolol tartrate 25 MG tablet Commonly known as: LOPRESSOR Take 1 tablet (25 mg total) by mouth  2 (two) times daily.   multivitamin tablet Take 1 tablet by mouth daily.   NovoLOG FlexPen 100 UNIT/ML FlexPen Generic drug: insulin aspart INJECT 6 TO 8 UNITS UNDER THE SKIN THREE TIMES DAILY WITH MEALS, PLUS SLIDING SCALE   ONE TOUCH LANCETS Misc USE TO TEST BLOOD SUGAR TWICE A DAY DX: E11.319   pravastatin 20 MG tablet Commonly known as: PRAVACHOL Take 1  tablet (20 mg total) by mouth daily.   tiZANidine 4 MG tablet Commonly known as: Zanaflex Take 1 tablet (4 mg total) by mouth at bedtime as needed for muscle spasms.   VITAMIN B12 PO Place under the tongue.   Vitamin D (Ergocalciferol) 1.25 MG (50000 UT) Caps capsule Commonly known as: DRISDOL TAKE 1 CAPSULE BY MOUTH every 7 DAYS   zolpidem 5 MG tablet Commonly known as: AMBIEN TAKE 1 TABLET BY MOUTH AT BEDTIME as needed for insomnia       All past medical history, surgical history, allergies, family history, immunizations andmedications were updated in the EMR today and reviewed under the history and medication portions of their EMR.     ROS: Negative, with the exception of above mentioned in HPI   Objective:  BP 108/69 (BP Location: Right Arm, Patient Position: Sitting, Cuff Size: Normal)   Pulse 73   Temp (!) 97.4 F (36.3 C) (Temporal)   Resp 16   Ht 5\' 4"  (1.626 m)   Wt 166 lb 6 oz (75.5 kg)   LMP  (LMP Unknown)   SpO2 98%   BMI 28.56 kg/m  Body mass index is 28.56 kg/m. Gen: Afebrile. No acute distress. Nontoxic in appearance, well developed, well nourished.  HENT: AT. [place].MMM Abd: Soft.. NTND. BS present.  No masses palpated. No rebound or guarding. MSK: No CVA tenderness Neuro:  Normal gait. PERLA. EOMi. Alert. Oriented x3   No exam data present No results found. Results for orders placed or performed in visit on 10/04/18 (from the past 24 hour(s))  POCT Urinalysis Dipstick (Automated)     Status: None   Collection Time: 10/04/18 11:50 AM  Result Value Ref Range   Color, UA yellow     Clarity, UA clear    Glucose, UA Negative Negative   Bilirubin, UA negative    Ketones, UA negative    Spec Grav, UA 1.015 1.010 - 1.025   Blood, UA negative    pH, UA 6.0 5.0 - 8.0   Protein, UA Negative Negative   Urobilinogen, UA 0.2 0.2 or 1.0 E.U./dL   Nitrite, UA negative    Leukocytes, UA Negative Negative    Assessment/Plan: LONA SIX is a 68 y.o. female present for OV for  Dysuria/Urinary frequency -Patient with symptoms of UTI consistent with prior UTIs.  Her point-of-care urine does not appear infectious.  However we will send a urine culture to be complete.  Printed prescription for Keflex 4 times daily x7 days in the event she worsens over the weekend and needs to start the medication.  Otherwise she will wait for urine culture before starting medication. - POCT Urinalysis Dipstick (Automated) - Urine Culture - common bladder irritants list also provided to patient to review  Diabetes: -Patient has diabetes and would like podiatry referral for her diabetic foot exam.  This was placed for her today.  Reviewed expectations re: course of current medical issues.  Discussed self-management of symptoms.  Outlined signs and symptoms indicating need for more acute intervention.  Patient verbalized understanding and all questions were answered.  Patient received an After-Visit Summary.    Orders Placed This Encounter  Procedures  . POCT Urinalysis Dipstick (Automated)     Note is dictated utilizing voice recognition software. Although note has been proof read prior to signing, occasional typographical errors still can be missed. If any questions arise, please do not hesitate to call for verification.   electronically signed by:  Howard Pouch, DO  [facility] [place]

## 2018-10-04 NOTE — Patient Instructions (Signed)
Script provided in case you worsen over the weekend- otherwise wait until we get culture back Hydrate.  Read common bladder irritants list.   I will place referral to podiatrist for you.

## 2018-10-06 LAB — URINE CULTURE
MICRO NUMBER:: 613423
SPECIMEN QUALITY:: ADEQUATE

## 2018-10-07 ENCOUNTER — Telehealth: Payer: Self-pay | Admitting: Family Medicine

## 2018-10-07 NOTE — Telephone Encounter (Signed)
Please inform patient the following information: Her ur-cx was positive for significant infection. The medication prescribed will treat infection. If she has not started it yet, she should start and take until completed per label.

## 2018-10-07 NOTE — Telephone Encounter (Signed)
Pt was called and started abx yesterday. She was given results and verbalized understanding

## 2018-10-11 ENCOUNTER — Other Ambulatory Visit: Payer: Self-pay | Admitting: Physician Assistant

## 2018-10-14 NOTE — Telephone Encounter (Signed)
Last Visit:04/16/2018 Next Visit:10/15/2018  Okay to refill Ambien?

## 2018-10-15 ENCOUNTER — Other Ambulatory Visit: Payer: Self-pay

## 2018-10-15 ENCOUNTER — Encounter: Payer: Self-pay | Admitting: Rheumatology

## 2018-10-15 ENCOUNTER — Ambulatory Visit (INDEPENDENT_AMBULATORY_CARE_PROVIDER_SITE_OTHER): Payer: Medicare Other | Admitting: Rheumatology

## 2018-10-15 VITALS — BP 105/73 | HR 79 | Resp 14 | Ht 64.0 in | Wt 169.4 lb

## 2018-10-15 DIAGNOSIS — Z8659 Personal history of other mental and behavioral disorders: Secondary | ICD-10-CM

## 2018-10-15 DIAGNOSIS — L4050 Arthropathic psoriasis, unspecified: Secondary | ICD-10-CM | POA: Diagnosis not present

## 2018-10-15 DIAGNOSIS — R5383 Other fatigue: Secondary | ICD-10-CM

## 2018-10-15 DIAGNOSIS — M47816 Spondylosis without myelopathy or radiculopathy, lumbar region: Secondary | ICD-10-CM | POA: Diagnosis not present

## 2018-10-15 DIAGNOSIS — M797 Fibromyalgia: Secondary | ICD-10-CM

## 2018-10-15 DIAGNOSIS — G4709 Other insomnia: Secondary | ICD-10-CM

## 2018-10-15 DIAGNOSIS — Z8639 Personal history of other endocrine, nutritional and metabolic disease: Secondary | ICD-10-CM

## 2018-10-15 DIAGNOSIS — M19041 Primary osteoarthritis, right hand: Secondary | ICD-10-CM | POA: Diagnosis not present

## 2018-10-15 DIAGNOSIS — L409 Psoriasis, unspecified: Secondary | ICD-10-CM

## 2018-10-15 DIAGNOSIS — Z8669 Personal history of other diseases of the nervous system and sense organs: Secondary | ICD-10-CM | POA: Diagnosis not present

## 2018-10-15 DIAGNOSIS — Z862 Personal history of diseases of the blood and blood-forming organs and certain disorders involving the immune mechanism: Secondary | ICD-10-CM

## 2018-10-15 DIAGNOSIS — Z8679 Personal history of other diseases of the circulatory system: Secondary | ICD-10-CM

## 2018-10-15 DIAGNOSIS — Z9884 Bariatric surgery status: Secondary | ICD-10-CM

## 2018-10-15 DIAGNOSIS — M19042 Primary osteoarthritis, left hand: Secondary | ICD-10-CM

## 2018-10-15 DIAGNOSIS — Z8739 Personal history of other diseases of the musculoskeletal system and connective tissue: Secondary | ICD-10-CM

## 2018-10-15 DIAGNOSIS — Z8709 Personal history of other diseases of the respiratory system: Secondary | ICD-10-CM

## 2018-10-15 DIAGNOSIS — M5030 Other cervical disc degeneration, unspecified cervical region: Secondary | ICD-10-CM

## 2018-10-15 MED ORDER — METHOCARBAMOL 500 MG PO TABS: 500.0000 mg | ORAL_TABLET | Freq: Every day | ORAL | 0 refills | Status: DC | PRN

## 2018-10-29 ENCOUNTER — Other Ambulatory Visit: Payer: Self-pay | Admitting: Family

## 2018-10-29 ENCOUNTER — Other Ambulatory Visit: Payer: Self-pay | Admitting: Internal Medicine

## 2018-10-29 DIAGNOSIS — E559 Vitamin D deficiency, unspecified: Secondary | ICD-10-CM

## 2018-11-05 ENCOUNTER — Ambulatory Visit: Payer: Medicare Other | Admitting: Physical Medicine and Rehabilitation

## 2018-11-11 ENCOUNTER — Other Ambulatory Visit: Payer: Self-pay | Admitting: Physician Assistant

## 2018-11-11 NOTE — Telephone Encounter (Signed)
Last Visit: 10/15/2018 Next Visit: message sent to the front desk to schedule.   Last fill of ambien: 10/14/2018 #30   Okay to refill robaxin and ambien?

## 2018-11-12 DIAGNOSIS — H5203 Hypermetropia, bilateral: Secondary | ICD-10-CM | POA: Diagnosis not present

## 2018-11-12 DIAGNOSIS — H40022 Open angle with borderline findings, high risk, left eye: Secondary | ICD-10-CM | POA: Diagnosis not present

## 2018-11-12 DIAGNOSIS — H43812 Vitreous degeneration, left eye: Secondary | ICD-10-CM | POA: Diagnosis not present

## 2018-11-12 DIAGNOSIS — E113293 Type 2 diabetes mellitus with mild nonproliferative diabetic retinopathy without macular edema, bilateral: Secondary | ICD-10-CM | POA: Diagnosis not present

## 2018-11-12 DIAGNOSIS — H524 Presbyopia: Secondary | ICD-10-CM | POA: Diagnosis not present

## 2018-11-12 DIAGNOSIS — H2513 Age-related nuclear cataract, bilateral: Secondary | ICD-10-CM | POA: Diagnosis not present

## 2018-11-12 LAB — HM DIABETES EYE EXAM

## 2018-11-13 ENCOUNTER — Other Ambulatory Visit: Payer: Self-pay

## 2018-11-13 ENCOUNTER — Ambulatory Visit (INDEPENDENT_AMBULATORY_CARE_PROVIDER_SITE_OTHER): Payer: Medicare Other | Admitting: Physical Medicine and Rehabilitation

## 2018-11-13 ENCOUNTER — Encounter: Payer: Self-pay | Admitting: Physical Medicine and Rehabilitation

## 2018-11-13 VITALS — BP 133/84 | HR 62 | Ht 64.0 in | Wt 180.0 lb

## 2018-11-13 DIAGNOSIS — G8929 Other chronic pain: Secondary | ICD-10-CM

## 2018-11-13 DIAGNOSIS — M25552 Pain in left hip: Secondary | ICD-10-CM | POA: Diagnosis not present

## 2018-11-13 DIAGNOSIS — M48062 Spinal stenosis, lumbar region with neurogenic claudication: Secondary | ICD-10-CM | POA: Diagnosis not present

## 2018-11-13 DIAGNOSIS — M47816 Spondylosis without myelopathy or radiculopathy, lumbar region: Secondary | ICD-10-CM | POA: Diagnosis not present

## 2018-11-13 DIAGNOSIS — M545 Low back pain: Secondary | ICD-10-CM | POA: Diagnosis not present

## 2018-11-13 NOTE — Progress Notes (Signed)
 .  Numeric Pain Rating Scale and Functional Assessment Average Pain 5 Pain Right Now 0 My pain is intermittent and sharp Pain is worse with: bending and some activites Pain improves with: rest   In the last MONTH (on 0-10 scale) has pain interfered with the following?  1. General activity like being  able to carry out your everyday physical activities such as walking, climbing stairs, carrying groceries, or moving a chair?  Rating(5)  2. Relation with others like being able to carry out your usual social activities and roles such as  activities at home, at work and in your community. Rating(5)  3. Enjoyment of life such that you have  been bothered by emotional problems such as feeling anxious, depressed or irritable?  Rating(1)

## 2018-11-14 ENCOUNTER — Encounter: Payer: Self-pay | Admitting: Physical Medicine and Rehabilitation

## 2018-11-14 NOTE — Progress Notes (Signed)
Priscilla [name] - 68 y.o. female MRN [id number]  Date of birth: 1950-04-14  Office Visit Note: Visit Date: 11/13/2018 PCP: Ma Hillock, DO Referred by: Ma Hillock, DO  Subjective: Chief Complaint  Patient presents with  . Lower Back - Pain  . Left Hip - Pain   HPI: Priscilla [name] is a 68 y.o. female who comes in today For reevaluation management of low back pain particularly left more than right with referral into the buttocks and hip laterally but no groin pain.  We have seen the patient in the past and she did well with facet joint injection at L4-5 and L5-S1.  Prior MRI findings show pretty significant facet arthropathy with some listhesis but no real focal nerve compression although there is some stenosis.  The last time we saw her was in 2018 and she has been doing fairly well with no other new medical conditions or trauma etc.  She is had no red flag complaints of bowel or bladder issues or fevers night sweats or chills.  No unintended weight loss.  She reports about 3 months ago of just worsening symptoms that have progressively gotten worse and are very limiting now in her activities of daily living.  She rates her average pain is a 5 out of 10 with intermittent sharp pain.  She reports with bending and standing she will get a sharp left-sided pain that catches and takes her breath away.  In general if she stands and walks for a long time she gets referral pain in the left hip and buttock region.  Not much down the leg or past the knee.  No paresthesias.  No focal weakness.  No prior history of lumbar surgery.  Review of Systems  Constitutional: Negative for chills, fever, malaise/fatigue and weight loss.  HENT: Negative for hearing loss and sinus pain.   Eyes: Negative for blurred vision, double vision and photophobia.  Respiratory: Negative for cough and shortness of breath.   Cardiovascular: Negative for chest pain, palpitations and leg swelling.  Gastrointestinal:  Negative for abdominal pain, nausea and vomiting.  Genitourinary: Negative for flank pain.  Musculoskeletal: Positive for back pain and joint pain. Negative for myalgias.  Skin: Negative for itching and rash.  Neurological: Negative for tremors, focal weakness and weakness.  Endo/Heme/Allergies: Negative.   Psychiatric/Behavioral: Negative for depression.  All other systems reviewed and are negative.  Otherwise per HPI.  Assessment & Plan: Visit Diagnoses:  1. Chronic bilateral low back pain without sciatica   2. Spondylosis without myelopathy or radiculopathy, lumbar region   3. Spinal stenosis of lumbar region with neurogenic claudication   4. Pain in left hip     Plan: Findings:  Chronic history of intermittent severe mostly axial low back pain some referral in the hip areas but not hip related.  Exam is most consistent with facet mediated low back pain with pain with extension and rotation facet loading.  Clinically appears she gets some claudication symptoms into the buttock region but nothing in the legs.  MRI from 2016 shows severe facet arthropathy at L4-5 with resultant narrowing of the canal.  No real red flag complaints other than worsening pain over the last several months and x-ray imaging was not identified to be needed.  I think she would do well with facet joint block at L4-5 bilaterally.  She has had bilateral pain.  Left is more than right.  Depending on those results could look at transforaminal epidural injection at  L4 for the stenosis.  We discussed briefly surgical consideration.  She is 7 in otherwise good health.  1 level treatment may be something that would help her long-term depending on how she does.  She is been doing great with conservative treatment with intermittent injections thus far.  She will continue with current medication.  She has had physical therapy in the past not recently.  She does see a chiropractor I believe.    Meds & Orders: No orders of the  defined types were placed in this encounter.  No orders of the defined types were placed in this encounter.   Follow-up: Return for Bilateral L4-5 facet joint injection.   Procedures: No procedures performed  No notes on file   Clinical History: L3-L4: Eccentric left mild broad-based disc bulge. Moderate bilateral facet arthropathy. Mild spinal stenosis. No evidence of neural foraminal stenosis.  L4-L5: Moderate broad-based disc bulge with a posterior annular fissure. Severe bilateral facet arthropathy with ligamentum flavum infolding resulting in severe spinal stenosis and bilateral lateral recess stenosis. No evidence of neural foraminal stenosis.  L5-S1: Mild broad-based disc bulge. Moderate bilateral facet arthropathy. No evidence of neural foraminal stenosis. No central canal stenosis.  IMPRESSION: 1. Lumbar spine spondylosis most severe at L4-5. 2. At L4-5 there is a moderate broad-based disc bulge with a posterior annular fissure. Severe bilateral facet arthropathy with ligamentum flavum infolding resulting in severe spinal stenosis and bilateral lateral recess stenosis.   Electronically Signed   By: Kathreen Devoid   On: 10/31/2014 16:53   She reports that she has never smoked. She has never used smokeless tobacco.  Recent Labs    05/23/18 1503 09/20/18 1411  HGBA1C 6.4* 6.1*    Objective:  VS:  HT:5\' 4"  (162.6 cm)   WT:180 lb (81.6 kg)  BMI:30.88    BP:133/84  HR:62bpm  TEMP: ( )  RESP:  Physical Exam Vitals signs and nursing note reviewed.  Constitutional:      General: She is not in acute distress.    Appearance: Normal appearance. She is well-developed. She is not ill-appearing.  HENT:     Head: Normocephalic and atraumatic.  Eyes:     Conjunctiva/sclera: Conjunctivae normal.     Pupils: Pupils are equal, round, and reactive to light.  Cardiovascular:     Rate and Rhythm: Normal rate.     Pulses: Normal pulses.  Pulmonary:     Effort:  Pulmonary effort is normal.  Musculoskeletal:     Right lower leg: No edema.     Left lower leg: No edema.     Comments: Patient ambulates without aid but walks with a forward flexed lumbar spine.  She has pretty exquisite pain with extension rotation to the left more than right and facet loading.  No focal trigger points noted.  No pain with hip rotation.  Good distal strength without clonus.  Skin:    General: Skin is warm and dry.     Findings: No erythema or rash.  Neurological:     General: No focal deficit present.     Mental Status: She is alert and oriented to person, place, and time.     Sensory: No sensory deficit.     Motor: No abnormal muscle tone.     Coordination: Coordination normal.     Gait: Gait normal.  Psychiatric:        Mood and Affect: Mood normal.        Behavior: Behavior normal.  Ortho Exam Imaging: No results found.  Past Medical/Family/Surgical/Social History: Medications & Allergies reviewed per EMR, new medications updated. Patient Active Problem List   Diagnosis Date Noted  . Type 2 diabetes mellitus with retinopathy, with long-term current use of insulin ([place]) 05/17/2018  . Overweight (BMI 25.0-29.9) 02/11/2018  . Vitamin D deficiency 10/13/2016  . 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 Iron deficiency anemia 04/28/2016  . Other insomnia 03/14/2016  . DDD (degenerative disc disease), cervical 03/14/2016  . Spondylosis without myelopathy or radiculopathy, lumbar region 01/31/2016  . Medicare annual wellness visit, initial 08/06/2015  . Anxiety and depression 06/25/2014  . Psoriatic arthritis - sees rheumatologist 02/20/2014  . Asthma 12/14/2008  . Diabetic peripheral neuropathy associated with type 2 diabetes mellitus ([place]) 08/06/2008  . Hyperlipemia 11/12/2006  . Essential hypertension 11/12/2006   Past Medical History:  Diagnosis Date  . Anemia 12/14/2008  . Anxiety and depression 12/14/2008   . Asthma 11/12/2006  . CAD (coronary artery disease) 12/14/2008   hx transluminal coronary angioplasty  . Cataract   . Depression   . Diabetes mellitus with neuropathy ([place]) 12/14/2008  . Fibromyalgia 01/04/2007   sees rheuamtology  . 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 Hyperlipemia 01/21/2010  . Hypertension 12/14/2008  . Leg edema 12/14/2008  . 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 OSA (obstructive sleep apnea) 08/06/2008  . Osteoarthritis 12/14/2008  . Palpitations 11/12/2006   hx sinus tachy  . Psoriatic arthritis [facility])    sees rheumatology  . Rotator cuff injury    Family History  Problem Relation Age of Onset  . Arthritis Mother   . Alzheimer's disease Mother   . Stroke Father   . Alcohol abuse Father   . Diabetes Father   . Heart disease Father   . Early death Father 10  . Diabetes Sister   . Arthritis Sister   . COPD Sister   . Arthritis Maternal Aunt   . Diabetes Maternal Aunt   . Early death Maternal Aunt 14  . Diabetes Maternal Uncle   . Cancer Paternal Aunt   . COPD Paternal 55   . Heart disease Paternal Aunt   . Heart disease Paternal Uncle   . Alcohol abuse Paternal Grandfather   . Heart disease Paternal Grandfather   . Early death Paternal Grandfather 53  . Stroke Paternal Grandfather   . Hypertension Son   . Autoimmune disease Daughter   . Breast cancer Neg Hx    Past Surgical History:  Procedure Laterality Date  . ABDOMINAL HYSTERECTOMY    . CHOLECYSTECTOMY    . LAPAROSCOPIC GASTRIC BANDING    . PTCA    . WRIST SURGERY     LEFT   Social History   Occupational History  . Not on file  Tobacco Use  . Smoking status: Never Smoker  . Smokeless tobacco: Never Used  Substance and Sexual Activity  . Alcohol use: Yes    Comment: glass of wine-special occasion  . Drug use: Never  . Sexual activity: Never    Birth control/protection: None

## 2018-11-15 ENCOUNTER — Encounter: Payer: Self-pay | Admitting: Family Medicine

## 2018-11-15 ENCOUNTER — Telehealth: Payer: Self-pay | Admitting: Rheumatology

## 2018-11-15 NOTE — Telephone Encounter (Signed)
[facility] for patient to call and schedule follow-up appointment in January 2021.

## 2018-11-15 NOTE — Telephone Encounter (Signed)
-----   Message from [place] sent at 11/11/2018 10:22 AM EDT ----- Please call to schedule follow up. Patient is due January 2021. Thanks!

## 2018-11-18 ENCOUNTER — Ambulatory Visit: Payer: Self-pay

## 2018-11-18 ENCOUNTER — Ambulatory Visit (INDEPENDENT_AMBULATORY_CARE_PROVIDER_SITE_OTHER): Payer: Medicare Other | Admitting: Physical Medicine and Rehabilitation

## 2018-11-18 ENCOUNTER — Encounter: Payer: Self-pay | Admitting: Physical Medicine and Rehabilitation

## 2018-11-18 VITALS — BP 123/70 | HR 65

## 2018-11-18 DIAGNOSIS — M47816 Spondylosis without myelopathy or radiculopathy, lumbar region: Secondary | ICD-10-CM

## 2018-11-18 MED ORDER — METHYLPREDNISOLONE ACETATE 80 MG/ML IJ SUSP
80.0000 mg | Freq: Once | INTRAMUSCULAR | Status: AC
  Administered 2018-11-18: 80 mg

## 2018-11-18 NOTE — Progress Notes (Signed)
.  Numeric Pain Rating Scale and Functional Assessment Average Pain 6   In the last MONTH (on 0-10 scale) has pain interfered with the following?  1. General activity like being  able to carry out your everyday physical activities such as walking, climbing stairs, carrying groceries, or moving a chair?  Rating(5)   +Driver, -BT, -Dye Allergies.  

## 2018-11-19 ENCOUNTER — Encounter: Payer: Self-pay | Admitting: Podiatry

## 2018-11-19 ENCOUNTER — Ambulatory Visit (INDEPENDENT_AMBULATORY_CARE_PROVIDER_SITE_OTHER): Payer: Medicare Other | Admitting: Podiatry

## 2018-11-19 ENCOUNTER — Other Ambulatory Visit: Payer: Self-pay

## 2018-11-19 ENCOUNTER — Ambulatory Visit (INDEPENDENT_AMBULATORY_CARE_PROVIDER_SITE_OTHER): Payer: Medicare Other

## 2018-11-19 VITALS — BP 127/78 | HR 66 | Temp 97.3°F | Resp 16

## 2018-11-19 DIAGNOSIS — M778 Other enthesopathies, not elsewhere classified: Secondary | ICD-10-CM

## 2018-11-19 DIAGNOSIS — E1142 Type 2 diabetes mellitus with diabetic polyneuropathy: Secondary | ICD-10-CM

## 2018-11-19 DIAGNOSIS — L603 Nail dystrophy: Secondary | ICD-10-CM | POA: Diagnosis not present

## 2018-11-19 DIAGNOSIS — B351 Tinea unguium: Secondary | ICD-10-CM | POA: Diagnosis not present

## 2018-11-19 DIAGNOSIS — M779 Enthesopathy, unspecified: Secondary | ICD-10-CM

## 2018-11-19 MED ORDER — GABAPENTIN 100 MG PO CAPS: 100.0000 mg | ORAL_CAPSULE | Freq: Every day | ORAL | 1 refills | Status: DC

## 2018-11-19 NOTE — Progress Notes (Signed)
[name] - 68 y.o. female MRN [id number]  Date of birth: Dec 23, 1950  Office Visit Note: Visit Date: 11/18/2018 PCP: Ma Hillock, DO Referred by: Ma Hillock, DO  Subjective: Chief Complaint  Patient presents with  . Lower Back - Pain   HPI:  Priscilla [name] is a 68 y.o. female who comes in today For planned bilateral L4-5 facet joint blocks.  Please see our prior evaluation and management note for further details and justification.  ROS Otherwise per HPI.  Assessment & Plan: Visit Diagnoses:  1. Spondylosis without myelopathy or radiculopathy, lumbar region     Plan: No additional findings.   Meds & Orders:  Meds ordered this encounter  Medications  . methylPREDNISolone acetate (DEPO-MEDROL) injection 80 mg    Orders Placed This Encounter  Procedures  . Facet Injection  . XR C-ARM NO REPORT    Follow-up: Return if symptoms worsen or fail to improve.   Procedures: No procedures performed  Lumbar Facet Joint Intra-Articular Injection(s) with Fluoroscopic Guidance  Patient: Priscilla [name]      Date of Birth: 15-Jul-1950 MRN: [id number] PCP: Ma Hillock, DO      Visit Date: 11/18/2018   Universal Protocol:    Date/Time: 11/18/2018  Consent Given By: the patient  Position: PRONE   Additional Comments: Vital signs were monitored before and after the procedure. Patient was prepped and draped in the usual sterile fashion. The correct patient, procedure, and site was verified.   Injection Procedure Details:  Procedure Site One Meds Administered:  Meds ordered this encounter  Medications  . methylPREDNISolone acetate (DEPO-MEDROL) injection 80 mg     Laterality: Bilateral  Location/Site:  L4-L5  Needle size: 22 guage  Needle type: Spinal  Needle Placement: Articular  Findings:  -Comments: Excellent flow of contrast producing a partial arthrogram.  Procedure Details: The fluoroscope beam is vertically oriented in AP, and  the inferior recess is visualized beneath the lower pole of the inferior apophyseal process, which represents the target point for needle insertion. When direct visualization is difficult the target point is located at the medial projection of the vertebral pedicle. The region overlying each aforementioned target is locally anesthetized with a 1 to 2 ml. volume of 1% Lidocaine without Epinephrine.   The spinal needle was inserted into each of the above mentioned facet joints using biplanar fluoroscopic guidance. A 0.25 to 0.5 ml. volume of Isovue-250 was injected and a partial facet joint arthrogram was obtained. A single spot film was obtained of the resulting arthrogram.    One to 1.25 ml of the steroid/anesthetic solution was then injected into each of the facet joints noted above.   Additional Comments:  The patient tolerated the procedure well Dressing: 2 x 2 sterile gauze and Band-Aid    Post-procedure details: Patient was observed during the procedure. Post-procedure instructions were reviewed.  Patient left the clinic in stable condition.    Clinical History: L3-L4: Eccentric left mild broad-based disc bulge. Moderate bilateral facet arthropathy. Mild spinal stenosis. No evidence of neural foraminal stenosis.  L4-L5: Moderate broad-based disc bulge with a posterior annular fissure. Severe bilateral facet arthropathy with ligamentum flavum infolding resulting in severe spinal stenosis and bilateral lateral recess stenosis. No evidence of neural foraminal stenosis.  L5-S1: Mild broad-based disc bulge. Moderate bilateral facet arthropathy. No evidence of neural foraminal stenosis. No central canal stenosis.  IMPRESSION: 1. Lumbar spine spondylosis most severe at L4-5. 2. At L4-5 there is a moderate broad-based  disc bulge with a posterior annular fissure. Severe bilateral facet arthropathy with ligamentum flavum infolding resulting in severe spinal stenosis and bilateral  lateral recess stenosis.   Electronically Signed   By: Kathreen Devoid   On: 10/31/2014 16:53     Objective:  VS:  HT:    WT:   BMI:     BP:123/70  HR:65bpm  TEMP: ( )  RESP:  Physical Exam  Ortho Exam Imaging: Xr C-arm No Report  Result Date: 11/18/2018 Please see Notes tab for imaging impression.

## 2018-11-19 NOTE — Procedures (Signed)
Lumbar Facet Joint Intra-Articular Injection(s) with Fluoroscopic Guidance  Patient: Priscilla [name]      Date of Birth: [birth year] MRN: [id number] PCP: Ma Hillock, DO      Visit Date: 11/18/2018   Universal Protocol:    Date/Time: 11/18/2018  Consent Given By: the patient  Position: PRONE   Additional Comments: Vital signs were monitored before and after the procedure. Patient was prepped and draped in the usual sterile fashion. The correct patient, procedure, and site was verified.   Injection Procedure Details:  Procedure Site One Meds Administered:  Meds ordered this encounter  Medications  . methylPREDNISolone acetate (DEPO-MEDROL) injection 80 mg     Laterality: Bilateral  Location/Site:  L4-L5  Needle size: 22 guage  Needle type: Spinal  Needle Placement: Articular  Findings:  -Comments: Excellent flow of contrast producing a partial arthrogram.  Procedure Details: The fluoroscope beam is vertically oriented in AP, and the inferior recess is visualized beneath the lower pole of the inferior apophyseal process, which represents the target point for needle insertion. When direct visualization is difficult the target point is located at the medial projection of the vertebral pedicle. The region overlying each aforementioned target is locally anesthetized with a 1 to 2 ml. volume of 1% Lidocaine without Epinephrine.   The spinal needle was inserted into each of the above mentioned facet joints using biplanar fluoroscopic guidance. A 0.25 to 0.5 ml. volume of Isovue-250 was injected and a partial facet joint arthrogram was obtained. A single spot film was obtained of the resulting arthrogram.    One to 1.25 ml of the steroid/anesthetic solution was then injected into each of the facet joints noted above.   Additional Comments:  The patient tolerated the procedure well Dressing: 2 x 2 sterile gauze and Band-Aid    Post-procedure details: Patient was  observed during the procedure. Post-procedure instructions were reviewed.  Patient left the clinic in stable condition.

## 2018-11-20 NOTE — Progress Notes (Signed)
Subjective:  Patient ID: Priscilla [name], female    DOB: [birth year],  MRN: [id number] HPI Chief Complaint  Patient presents with  . Diabetes    Diabetic Foot Exam - last a1c was 6.4, weird "gravel-like sensations on the bottoms, numbness, flat feet, toe sensitivity, leg length difference, nail fungus right foot  . New Patient (Initial Visit)    68 y.o. female presents with the above complaint.   ROS: Denies fever chills nausea vomiting muscle aches pains calf pain back pain chest pain shortness of breath.  Past Medical History:  Diagnosis Date  . Anemia 12/14/2008  . Anxiety and depression 12/14/2008  . Asthma 11/12/2006  . CAD (coronary artery disease) 12/14/2008   hx transluminal coronary angioplasty  . Cataract   . Depression   . Diabetes mellitus with neuropathy ([place]) 12/14/2008  . Fibromyalgia 01/04/2007   sees rheuamtology  . 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 Hyperlipemia 01/21/2010  . Hypertension 12/14/2008  . Leg edema 12/14/2008  . 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 OSA (obstructive sleep apnea) 08/06/2008  . Osteoarthritis 12/14/2008  . Palpitations 11/12/2006   hx sinus tachy  . Psoriatic arthritis [facility])    sees rheumatology  . Rotator cuff injury    Past Surgical History:  Procedure Laterality Date  . ABDOMINAL HYSTERECTOMY    . CHOLECYSTECTOMY    . LAPAROSCOPIC GASTRIC BANDING    . PTCA    . WRIST SURGERY     LEFT    Current Outpatient Medications:  .  albuterol (PROVENTIL HFA;VENTOLIN HFA) 108 (90 Base) MCG/ACT inhaler, Inhale 2 puffs into the lungs every 6 (six) hours as needed for wheezing or shortness of breath., Disp: 1 Inhaler, Rfl: 1 .  aspirin 81 MG tablet, Take 81 mg by mouth daily.  , Disp: , Rfl:  .  Biotin 5000 MCG TABS, Take 5,000 mcg by mouth daily. , Disp: , Rfl:  .  calcium-vitamin D 250-100 MG-UNIT per tablet, Take 1 tablet by mouth daily. , Disp: , Rfl:  .  Cyanocobalamin (VITAMIN B12 PO), Place under the tongue., Disp: , Rfl:  .   DULoxetine (CYMBALTA) 30 MG capsule, TAKE 1 CAPSULE BY MOUTH EVERY DAY, Disp: 90 capsule, Rfl: 1 .  DULoxetine (CYMBALTA) 60 MG capsule, TAKE 1 CAPSULE BY MOUTH EVERY DAY, Disp: 90 capsule, Rfl: 1 .  fluticasone (FLONASE) 50 MCG/ACT nasal spray, Place 2 sprays into both nostrils daily., Disp: 16 g, Rfl: 6 .  gabapentin (NEURONTIN) 100 MG capsule, Take 1 capsule (100 mg total) by mouth at bedtime., Disp: 30 capsule, Rfl: 1 .  glucagon (GLUCAGON EMERGENCY) 1 MG injection, Inject 1 mg into the muscle once as needed., Disp: 1 each, Rfl: 12 .  glucose blood (ONETOUCH VERIO) test strip, USE TO TEST BLOOD SUGAR TWICE A DAY DX: E11.319, Disp: 200 each, Rfl: 0 .  Insulin Pen Needle 32G X 6 MM MISC, Use to inject Novolog 3 times daily DX: E11.319, Disp: 300 each, Rfl: 1 .  Insulin Syringe-Needle U-100 (B-D INS SYR MICROFINE 1CC/27G) 27G X 5/8" 1 ML MISC, Use to inject levemir once daily DX: E11.319, Disp: 120 each, Rfl: 6 .  LEVEMIR 100 UNIT/ML injection, INJECT 0.3 ML (30 UNITS) INTO THE SKIN AT BEDTIME, Disp: 30 mL, Rfl: 3 .  lisinopril (ZESTRIL) 2.5 MG tablet, Take 1 tablet (2.5 mg total) by mouth daily., Disp: 90 tablet, Rfl: 1 .  metFORMIN (GLUCOPHAGE) 1000 MG tablet, TAKE 1 TABLET BY MOUTH 2 TIMES DAILY WITH  MEALS, Disp: 60 tablet, Rfl: 2 .  methocarbamol (ROBAXIN) 500 MG tablet, Take 1 tablet (500 mg total) by mouth daily as needed for muscle spasms., Disp: 30 tablet, Rfl: 0 .  metoprolol tartrate (LOPRESSOR) 25 MG tablet, Take 1 tablet (25 mg total) by mouth 2 (two) times daily., Disp: 180 tablet, Rfl: 1 .  Multiple Vitamin (MULTIVITAMIN) tablet, Take 1 tablet by mouth daily. , Disp: , Rfl:  .  NOVOLOG FLEXPEN 100 UNIT/ML FlexPen, INJECT 6 TO 8 UNITS UNDER THE SKIN THREE TIMES DAILY WITH MEALS, PLUS SLIDING SCALE, Disp: 15 mL, Rfl: 0 .  ONE TOUCH LANCETS MISC, USE TO TEST BLOOD SUGAR TWICE A DAY DX: E11.319, Disp: 200 each, Rfl: 1 .  pravastatin (PRAVACHOL) 20 MG tablet, Take 1 tablet (20 mg total) by  mouth daily., Disp: 90 tablet, Rfl: 3 .  Vitamin D, Ergocalciferol, (DRISDOL) 1.25 MG (50000 UT) CAPS capsule, TAKE 1 CAPSULE BY MOUTH every 7 DAYS, Disp: 15 capsule, Rfl: 3 .  zolpidem (AMBIEN) 5 MG tablet, TAKE 1 TABLET BY MOUTH AT BEDTIME as needed for insomnia, Disp: 30 tablet, Rfl: 0  No Known Allergies Review of Systems Objective:   Vitals:   11/19/18 1018  BP: 127/78  Pulse: 66  Resp: 16  Temp: (!) 97.3 F (36.3 C)    General: Well developed, nourished, in no acute distress, alert and oriented x3   Dermatological: Skin is warm, dry and supple bilateral. Nails x 10 are well maintained; remaining integument appears unremarkable at this time. There are no open sores, no preulcerative lesions, no rash or signs of infection present.  Vascular: Dorsalis Pedis artery and Posterior Tibial artery pedal pulses are 2/4 bilateral with immedate capillary fill time. Pedal hair growth present. No varicosities and no lower extremity edema present bilateral.   Neruologic: Grossly intact via light touch bilateral. Vibratory intact via tuning fork bilateral. Protective threshold with Semmes Wienstein monofilament intact to all pedal sites bilateral. Patellar and Achilles deep tendon reflexes 2+ bilateral. No Babinski or clonus noted bilateral.   Musculoskeletal: No gross boney pedal deformities bilateral. No pain, crepitus, or limitation noted with foot and ankle range of motion bilateral. Muscular strength 5/5 in all groups tested bilateral.  Gait: Unassisted, Nonantalgic.    Radiographs:  Radiographs do not demonstrate any type of acute issues.  Assessment & Plan:   Assessment: Capsulitis of the second metatarsal phalangeal joint diabetic peripheral neuropathy painful elongated toenails possible nail dystrophy rule out onychomycosis.  Plan: Start her on 100 mg of gabapentin at nighttime.  We are also going get her scheduled with [facility] for diabetic shoes I also injected around the second  metatarsal phalangeal joint for capsulitis right foot and also took samples of the toenails today to be sent for pathologic evaluation.  We will follow-up with her in 1 month     Niaja Stickley T. [place], [place]

## 2018-11-26 ENCOUNTER — Other Ambulatory Visit: Payer: Self-pay | Admitting: Podiatry

## 2018-11-27 ENCOUNTER — Other Ambulatory Visit: Payer: Medicare Other | Admitting: Orthotics

## 2018-12-02 ENCOUNTER — Encounter: Payer: Medicare Other | Admitting: Physical Medicine and Rehabilitation

## 2018-12-02 ENCOUNTER — Ambulatory Visit: Payer: Medicare Other

## 2018-12-03 ENCOUNTER — Ambulatory Visit (INDEPENDENT_AMBULATORY_CARE_PROVIDER_SITE_OTHER): Payer: Medicare Other

## 2018-12-03 ENCOUNTER — Other Ambulatory Visit: Payer: Self-pay

## 2018-12-03 VITALS — BP 110/60 | HR 77 | Ht 64.0 in | Wt 167.1 lb

## 2018-12-03 DIAGNOSIS — Z23 Encounter for immunization: Secondary | ICD-10-CM | POA: Diagnosis not present

## 2018-12-03 DIAGNOSIS — Z0000 Encounter for general adult medical examination without abnormal findings: Secondary | ICD-10-CM

## 2018-12-03 DIAGNOSIS — Z1211 Encounter for screening for malignant neoplasm of colon: Secondary | ICD-10-CM

## 2018-12-03 DIAGNOSIS — E663 Overweight: Secondary | ICD-10-CM | POA: Diagnosis not present

## 2018-12-03 DIAGNOSIS — Z1239 Encounter for other screening for malignant neoplasm of breast: Secondary | ICD-10-CM

## 2018-12-03 MED ORDER — SHINGRIX 50 MCG/0.5ML IM SUSR: 0.5000 mL | Freq: Once | INTRAMUSCULAR | 1 refills | Status: AC

## 2018-12-03 NOTE — Patient Instructions (Addendum)
Shingles vaccine at [facility].   Complete Cologuard kit.   Schedule mammogram after 02/15/2019.   Continue doing brain stimulating activities (puzzles, reading, adult coloring books, staying active) to keep memory sharp.     Fall Prevention in the Home, Adult Falls can cause injuries. They can happen to people of all ages. There are many things you can do to make your home safe and to help prevent falls. Ask for help when making these changes, if needed. What actions can I take to prevent falls? General Instructions  Use good lighting in all rooms. Replace any light bulbs that burn out.  Turn on the lights when you go into a dark area. Use night-lights.  Keep items that you use often in easy-to-reach places. Lower the shelves around your home if necessary.  Set up your furniture so you have a clear path. Avoid moving your furniture around.  Do not have throw rugs and other things on the floor that can make you trip.  Avoid walking on wet floors.  If any of your floors are uneven, fix them.  Add color or contrast paint or tape to clearly mark and help you see: ? Any grab bars or handrails. ? First and last steps of stairways. ? Where the edge of each step is.  If you use a stepladder: ? Make sure that it is fully opened. Do not climb a closed stepladder. ? Make sure that both sides of the stepladder are locked into place. ? Ask someone to hold the stepladder for you while you use it.  If there are any pets around you, be aware of where they are. What can I do in the bathroom?      Keep the floor dry. Clean up any water that spills onto the floor as soon as it happens.  Remove soap buildup in the tub or shower regularly.  Use non-skid mats or decals on the floor of the tub or shower.  Attach bath mats securely with double-sided, non-slip rug tape.  If you need to sit down in the shower, use a plastic, non-slip stool.  Install grab bars by the toilet and in the tub  and shower. Do not use towel bars as grab bars. What can I do in the bedroom?  Make sure that you have a light by your bed that is easy to reach.  Do not use any sheets or blankets that are too big for your bed. They should not hang down onto the floor.  Have a firm chair that has side arms. You can use this for support while you get dressed. What can I do in the kitchen?  Clean up any spills right away.  If you need to reach something above you, use a strong step stool that has a grab bar.  Keep electrical cords out of the way.  Do not use floor polish or wax that makes floors slippery. If you must use wax, use non-skid floor wax. What can I do with my stairs?  Do not leave any items on the stairs.  Make sure that you have a light switch at the top of the stairs and the bottom of the stairs. If you do not have them, ask someone to add them for you.  Make sure that there are handrails on both sides of the stairs, and use them. Fix handrails that are broken or loose. Make sure that handrails are as long as the stairways.  Install non-slip stair treads on all  stairs in your home.  Avoid having throw rugs at the top or bottom of the stairs. If you do have throw rugs, attach them to the floor with carpet tape.  Choose a carpet that does not hide the edge of the steps on the stairway.  Check any carpeting to make sure that it is firmly attached to the stairs. Fix any carpet that is loose or worn. What can I do on the outside of my home?  Use bright outdoor lighting.  Regularly fix the edges of walkways and driveways and fix any cracks.  Remove anything that might make you trip as you walk through a door, such as a raised step or threshold.  Trim any bushes or trees on the path to your home.  Regularly check to see if handrails are loose or broken. Make sure that both sides of any steps have handrails.  Install guardrails along the edges of any raised decks and porches.  Clear  walking paths of anything that might make someone trip, such as tools or rocks.  Have any leaves, snow, or ice cleared regularly.  Use sand or salt on walking paths during winter.  Clean up any spills in your garage right away. This includes grease or oil spills. What other actions can I take?  Wear shoes that: ? Have a low heel. Do not wear high heels. ? Have rubber bottoms. ? Are comfortable and fit you well. ? Are closed at the toe. Do not wear open-toe sandals.  Use tools that help you move around (mobility aids) if they are needed. These include: ? Canes. ? Walkers. ? Scooters. ? Crutches.  Review your medicines with your doctor. Some medicines can make you feel dizzy. This can increase your chance of falling. Ask your doctor what other things you can do to help prevent falls. Where to find more information  Centers for Disease Control and Prevention, [employer]ADI: https://garcia.biz/  [employer] on Aging: BrainJudge.co.uk Contact a doctor if:  You are afraid of falling at home.  You feel weak, drowsy, or dizzy at home.  You fall at home. Summary  There are many simple things that you can do to make your home safe and to help prevent falls.  Ways to make your home safe include removing tripping hazards and installing grab bars in the bathroom.  Ask for help when making these changes in your home. This information is not intended to replace advice given to you by your health care provider. Make sure you discuss any questions you have with your health care provider. Document Released: 01/21/2009 Document Revised: 07/18/2018 Document Reviewed: 11/09/2016 Elsevier Patient Education  2020 [place] Maintenance, Female Adopting a healthy lifestyle and getting preventive care are important in promoting health and wellness. Ask your health care provider about:  The right schedule for you to have regular tests and exams.  Things you can do on  your own to prevent diseases and keep yourself healthy. What should I know about diet, weight, and exercise? Eat a healthy diet   Eat a diet that includes plenty of vegetables, fruits, low-fat dairy products, and lean protein.  Do not eat a lot of foods that are high in solid fats, added sugars, or sodium. Maintain a healthy weight Body mass index (BMI) is used to identify weight problems. It estimates body fat based on height and weight. Your health care provider can help determine your BMI and help you achieve or maintain a healthy weight.  Get regular exercise Get regular exercise. This is one of the most important things you can do for your health. Most adults should:  Exercise for at least 150 minutes each week. The exercise should increase your heart rate and make you sweat (moderate-intensity exercise).  Do strengthening exercises at least twice a week. This is in addition to the moderate-intensity exercise.  Spend less time sitting. Even light physical activity can be beneficial. Watch cholesterol and blood lipids Have your blood tested for lipids and cholesterol at 69 years of age, then have this test every 5 years. Have your cholesterol levels checked more often if:  Your lipid or cholesterol levels are high.  You are older than 69 years of age.  You are at high risk for heart disease. What should I know about cancer screening? Depending on your health history and family history, you may need to have cancer screening at various ages. This may include screening for:  Breast cancer.  Cervical cancer.  Colorectal cancer.  Skin cancer.  Lung cancer. What should I know about heart disease, diabetes, and high blood pressure? Blood pressure and heart disease  High blood pressure causes heart disease and increases the risk of stroke. This is more likely to develop in people who have high blood pressure readings, are of African descent, or are overweight.  Have your blood  pressure checked: ? Every 3-5 years if you are 59-46 years of age. ? Every year if you are 1 years old or older. Diabetes Have regular diabetes screenings. This checks your fasting blood sugar level. Have the screening done:  Once every three years after age 69 if you are at a normal weight and have a low risk for diabetes.  More often and at a younger age if you are overweight or have a high risk for diabetes. What should I know about preventing infection? Hepatitis B If you have a higher risk for hepatitis B, you should be screened for this virus. Talk with your health care provider to find out if you are at risk for hepatitis B infection. Hepatitis C Testing is recommended for:  Everyone born from [birth year] through 1966.  Anyone with known risk factors for hepatitis C. Sexually transmitted infections (STIs)  Get screened for STIs, including gonorrhea and chlamydia, if: ? You are sexually active and are younger than 69 years of age. ? You are older than 70 years of age and your health care provider tells you that you are at risk for this type of infection. ? Your sexual activity has changed since you were last screened, and you are at increased risk for chlamydia or gonorrhea. Ask your health care provider if you are at risk.  Ask your health care provider about whether you are at high risk for HIV. Your health care provider may recommend a prescription medicine to help prevent HIV infection. If you choose to take medicine to prevent HIV, you should first get tested for HIV. You should then be tested every 3 months for as long as you are taking the medicine. Pregnancy  If you are about to stop having your period (premenopausal) and you may become pregnant, seek counseling before you get pregnant.  Take 400 to 800 micrograms (mcg) of folic acid every day if you become pregnant.  Ask for birth control (contraception) if you want to prevent pregnancy. Osteoporosis and menopause  Osteoporosis is a disease in which the bones lose minerals and strength with aging. This can result in bone  fractures. If you are 52 years old or older, or if you are at risk for osteoporosis and fractures, ask your health care provider if you should:  Be screened for bone loss.  Take a calcium or vitamin D supplement to lower your risk of fractures.  Be given hormone replacement therapy (HRT) to treat symptoms of menopause. Follow these instructions at home: Lifestyle  Do not use any products that contain nicotine or tobacco, such as cigarettes, e-cigarettes, and chewing tobacco. If you need help quitting, ask your health care provider.  Do not use street drugs.  Do not share needles.  Ask your health care provider for help if you need support or information about quitting drugs. Alcohol use  Do not drink alcohol if: ? Your health care provider tells you not to drink. ? You are pregnant, may be pregnant, or are planning to become pregnant.  If you drink alcohol: ? Limit how much you use to 0-1 drink a day. ? Limit intake if you are breastfeeding.  Be aware of how much alcohol is in your drink. In the [place] one drink equals one 12 oz bottle of beer (355 mL), one 5 oz glass of wine (148 mL), or one 1 oz glass of hard liquor (44 mL). General instructions  Schedule regular health, dental, and eye exams.  Stay current with your vaccines.  Tell your health care provider if: ? You often feel depressed. ? You have ever been abused or do not feel safe at home. Summary  Adopting a healthy lifestyle and getting preventive care are important in promoting health and wellness.  Follow your health care provider's instructions about healthy diet, exercising, and getting tested or screened for diseases.  Follow your health care provider's instructions on monitoring your cholesterol and blood pressure. This information is not intended to replace advice given to you by your health care  provider. Make sure you discuss any questions you have with your health care provider. Document Released: 10/10/2010 Document Revised: 03/20/2018 Document Reviewed: 03/20/2018 Elsevier Patient Education  2020 [employer].

## 2018-12-03 NOTE — Progress Notes (Addendum)
Subjective:   VAEDA WESTALL is a 68 y.o. female who presents for Medicare Annual (Subsequent) preventive examination.  Review of Systems:  No ROS.  Medicare Wellness Visit.  See social history for additional risk factors.  Cardiac Risk Factors include: advanced age (>39mn, >>53women);diabetes mellitus;hypertension;obesity (BMI >30kg/m2);dyslipidemia;family history of premature cardiovascular disease Sleep patterns: Sleeps 4-5 hours. Up to void several times.  Home Safety/Smoke Alarms: Feels safe in home. Smoke alarms in place.  Living environment; residence and Firearm Safety: Lives with husband and sister in 1 story home. Rail at door. Seat Belt Safety/Bike Helmet: Wears seat belt.   Female:   Pap-N/A      Mammo-02/14/2018, BI-RADS CATEGORY  1: Negative.  Ordered today.    Dexa scan-08/30/2015, normal.        CCS-Colonoscopy 10/26/2008, normal. Recall 10 years. Cologuard ordered.      Objective:     Vitals: BP 110/60 (BP Location: Left Arm, Patient Position: Sitting, Cuff Size: Normal)   Pulse 77   Ht '5\' 4"'  (1.626 m)   Wt 167 lb 2 oz (75.8 kg)   LMP  (LMP Unknown)   SpO2 97%   BMI 28.69 kg/m   Body mass index is 28.69 kg/m.  Advanced Directives 12/03/2018 01/08/2018 11/27/2017 04/13/2017 01/12/2017 01/08/2017 11/10/2016  Does Patient Have a Medical Advance Directive? No Yes No No No No No  Type of Advance Directive - Healthcare Power of [place] - - - - -  [place]? - No - copy requested - - - - -  Would patient like information on creating a medical advance directive? No - Patient declined - No - Patient declined No - Patient declined - No - Patient declined Yes (MAU/Ambulatory/Procedural Areas - Information given)    Tobacco Social History   Tobacco Use  Smoking Status Never Smoker  Smokeless Tobacco Never Used     Counseling given: Not Answered    Past Medical History:  Diagnosis Date  . Anemia 12/14/2008  .  Anxiety and depression 12/14/2008  . Asthma 11/12/2006  . CAD (coronary artery disease) 12/14/2008   hx transluminal coronary angioplasty  . Cataract   . Depression   . [place] 12/14/[place]  . Fibromyalgia 01/04/2007   sees rheuamtology  . 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 Hyperlipemia 01/21/2010  . Hypertension 12/14/2008  . Leg edema 12/14/2008  . 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 OSA (obstructive sleep apnea) 08/06/2008  . Osteoarthritis 12/14/2008  . Palpitations 11/12/2006   hx sinus tachy  . Psoriatic arthritis ([facility]    sees rheumatology  . Rotator cuff injury    Past Surgical History:  Procedure Laterality Date  . ABDOMINAL HYSTERECTOMY    . CHOLECYSTECTOMY    . LAPAROSCOPIC GASTRIC BANDING    . PTCA    . WRIST SURGERY     LEFT   Family History  Problem Relation Age of Onset  . Arthritis Mother   . Alzheimer's disease Mother   . Stroke Father   . Alcohol abuse Father   . Diabetes Father   . Heart disease Father   . Early death Father 637 . Diabetes Sister   . Arthritis Sister   . COPD Sister   . Arthritis Maternal Aunt   . Diabetes Maternal Aunt   . Early death Maternal Aunt 536 . Diabetes Maternal Uncle   . Cancer Paternal Aunt   . COPD Paternal A21  .  Heart disease Paternal Aunt   . Heart disease Paternal Uncle   . Alcohol abuse Paternal Grandfather   . Heart disease Paternal Grandfather   . Early death Paternal Grandfather 10  . Stroke Paternal Grandfather   . Hypertension Son   . Autoimmune disease Daughter   . Breast cancer Neg Hx    Social History   Socioeconomic History  . Marital status: Married    Spouse name: Not on file  . Number of children: Not on file  . Years of education: Not on file  . Highest education level: Not on file  Occupational History  . Not on file  Social Needs  . Financial resource strain: Not on file  . Food insecurity    Worry: Not on file    Inability: Not on file  .  Transportation needs    Medical: Not on file    Non-medical: Not on file  Tobacco Use  . Smoking status: Never Smoker  . Smokeless tobacco: Never Used  Substance and Sexual Activity  . Alcohol use: Yes    Comment: glass of wine-special occasion  . Drug use: Never  . Sexual activity: Never    Birth control/protection: None  Lifestyle  . Physical activity    Days per week: Not on file    Minutes per session: Not on file  . Stress: Not on file  Relationships  . Social Herbalist on phone: Not on file    Gets together: Not on file    Attends religious service: Not on file    Active member of club or organization: Not on file    Attends meetings of clubs or organizations: Not on file    Relationship status: Not on file  Other Topics Concern  . Not on file  Social History Narrative   Married, [name]. 2 children name Abe People and Anderson Malta.   Some college, retired Engineer, manufacturing.   Drinks caffeine.   Take a daily vitamin.   Wears her seatbelt, exercises 3 times a week.   Smoke detector in the home.   Firearms in the home.   Feels safe in her relationships.    Outpatient Encounter Medications as of 12/03/2018  Medication Sig  . albuterol (PROVENTIL HFA;VENTOLIN HFA) 108 (90 Base) MCG/ACT inhaler Inhale 2 puffs into the lungs every 6 (six) hours as needed for wheezing or shortness of breath.  Marland Kitchen aspirin 81 MG tablet Take 81 mg by mouth daily.    . Biotin 5000 MCG TABS Take 5,000 mcg by mouth daily.   . calcium-vitamin D 250-100 MG-UNIT per tablet Take 1 tablet by mouth daily.   . Cyanocobalamin (VITAMIN B12 PO) Place under the tongue.  . DULoxetine (CYMBALTA) 30 MG capsule TAKE 1 CAPSULE BY MOUTH EVERY DAY  . DULoxetine (CYMBALTA) 60 MG capsule TAKE 1 CAPSULE BY MOUTH EVERY DAY  . fluticasone (FLONASE) 50 MCG/ACT nasal spray Place 2 sprays into both nostrils daily.  Marland Kitchen gabapentin (NEURONTIN) 100 MG capsule TAKE 1 CAPSULE BY MOUTH NIGHTLY AT BEDTIME  . glucose  blood (ONETOUCH VERIO) test strip USE TO TEST BLOOD SUGAR TWICE A DAY DX: E11.319  . Insulin Pen Needle 32G X 6 MM MISC Use to inject Novolog 3 times daily DX: E11.319  . Insulin Syringe-Needle U-100 (B-D INS SYR MICROFINE 1CC/27G) 27G X 5/8" 1 ML MISC Use to inject levemir once daily DX: E11.319  . LEVEMIR 100 UNIT/ML injection INJECT 0.3 ML (30 UNITS) INTO THE SKIN AT  BEDTIME  . lisinopril (ZESTRIL) 2.5 MG tablet Take 1 tablet (2.5 mg total) by mouth daily.  . metFORMIN (GLUCOPHAGE) 1000 MG tablet TAKE 1 TABLET BY MOUTH 2 TIMES DAILY WITH MEALS  . methocarbamol (ROBAXIN) 500 MG tablet Take 1 tablet (500 mg total) by mouth daily as needed for muscle spasms.  . metoprolol tartrate (LOPRESSOR) 25 MG tablet Take 1 tablet (25 mg total) by mouth 2 (two) times daily.  . Multiple Vitamin (MULTIVITAMIN) tablet Take 1 tablet by mouth daily.   Marland Kitchen NOVOLOG FLEXPEN 100 UNIT/ML FlexPen INJECT 6 TO 8 UNITS UNDER THE SKIN THREE TIMES DAILY WITH MEALS, PLUS SLIDING SCALE  . ONE TOUCH LANCETS MISC USE TO TEST BLOOD SUGAR TWICE A DAY DX: E11.319  . pravastatin (PRAVACHOL) 20 MG tablet Take 1 tablet (20 mg total) by mouth daily.  . Vitamin D, Ergocalciferol, (DRISDOL) 1.25 MG (50000 UT) CAPS capsule TAKE 1 CAPSULE BY MOUTH every 7 DAYS  . zolpidem (AMBIEN) 5 MG tablet TAKE 1 TABLET BY MOUTH AT BEDTIME as needed for insomnia  . glucagon (GLUCAGON EMERGENCY) 1 MG injection Inject 1 mg into the muscle once as needed. (Patient not taking: Reported on 12/03/2018)  . Zoster Vaccine Adjuvanted [facility]) injection Inject 0.5 mLs into the muscle once for 1 dose.   No facility-administered encounter medications on file as of 12/03/2018.     Activities of Daily Living In your present state of health, do you have any difficulty performing the following activities: 12/03/2018  Hearing? N  Vision? N  Difficulty concentrating or making decisions? N  Walking or climbing stairs? N  Dressing or bathing? N  Doing errands,  shopping? N  Preparing Food and eating ? N  Using the Toilet? N  In the past six months, have you accidently leaked urine? N  Do you have problems with loss of bowel control? N  Managing your Medications? N  Managing your Finances? N  Housekeeping or managing your Housekeeping? N  Some recent data might be hidden    Patient Care Team: Ma Hillock, DO as PCP - General (Family Medicine) Marlou Sa, Tonna Corner, MD as Consulting Physician (Orthopedic Surgery) Philemon Kingdom, MD as Consulting Physician (Internal Medicine) Mcarthur Rossetti, MD as Consulting Physician (Orthopedic Surgery) Bo Merino, MD as Consulting Physician (Rheumatology) Volanda Napoleon, MD as Consulting Physician (Oncology) Magnus Sinning, MD as Consulting Physician (Physical Medicine and Rehabilitation) Dermatology, [facility], Romilda Garret, [place] as Consulting Physician (Podiatry)    Assessment:   This is a routine wellness examination for [place].  Exercise Activities and Dietary recommendations Current Exercise Habits: The patient does not participate in regular exercise at present(Keeps 80 year old grandchild daily), Exercise limited by: None identified   Diet (meal preparation, eat out, water intake, caffeinated beverages, dairy products, fruits and vegetables): Drinks green tea with honey; occasional diet soda.   Breakfast: greek yogurt with almonds/granola; coffee Lunch: leftovers Dinner: protein and veggies.   Goals      Patient Stated   . Patient states.  (pt-stated)     Improve nutrition, mental health (increasing social mtgs with friends) and increasing activity.       Other   . Patient Stated     Increase social activities and drink more water.     . Patient Stated     Increase craft activities to improve mind.       Fall Risk Fall Risk  12/03/2018 11/27/2017 01/08/2017 11/10/2016 10/30/2016  Falls in the past year? 0 No  No Yes Yes  Comment - - - - Emmi Telephone Survey: data  to providers prior to load  Number falls in past yr: 0 - - 1 1  Comment - - - - Emmi Telephone Survey Actual Response = 1  Injury with Fall? 0 - - No No  Risk for fall due to : - - - Impaired balance/gait -  Follow up Falls prevention discussed - - Falls prevention discussed -    Depression Screen PHQ 2/9 Scores 12/03/2018 08/13/2018 02/11/2018 11/27/2017  PHQ - 2 Score 0 0 1 1  PHQ- 9 Score - '1 2 4     ' Cognitive Function MMSE - Mini Mental State Exam 12/03/2018 11/27/2017  Orientation to time 5 5  Orientation to Place 5 5  Registration 3 3  Attention/ Calculation 5 5  Recall 3 3  Language- name 2 objects 2 2  Language- repeat 1 1  Language- follow 3 step command 3 3  Language- read & follow direction 1 1  Write a sentence 1 1  Copy design 1 1  Total score 30 30        Immunization History  Administered Date(s) Administered  . Fluad Quad(high Dose 65+) 12/03/2018  . Influenza Split 01/21/2013  . Influenza, High Dose Seasonal PF 02/04/2015, 02/09/2016, 02/11/2018  . Influenza,inj,Quad PF,6+ Mos 01/15/2014  . Influenza-Unspecified 01/26/2017  . Pneumococcal Conjugate-13 02/04/2015  . Pneumococcal Polysaccharide-23 10/31/2013  . Tdap 02/07/2013      Screening Tests Health Maintenance  Topic Date Due  . Fecal DNA (Cologuard)  08/18/1999  . PNA vac Low Risk Adult (2 of 2 - PPSV23) 11/01/2018  . HEMOGLOBIN A1C  03/22/2019  . OPHTHALMOLOGY EXAM  11/12/2019  . FOOT EXAM  11/19/2019  . MAMMOGRAM  02/15/2020  . TETANUS/TDAP  02/08/2023  . INFLUENZA VACCINE  Completed  . Hepatitis C Screening  Completed  . DEXA SCAN  Addressed        Plan:    Shingles vaccine at [facility].   Complete Cologuard kit.   Schedule mammogram after 02/15/2019.   Continue doing brain stimulating activities (puzzles, reading, adult coloring books, staying active) to keep memory sharp.   I have personally reviewed and noted the following in the patient's chart:   . Medical and social  history . Use of alcohol, tobacco or illicit drugs  . Current medications and supplements . Functional ability and status . Nutritional status . Physical activity . Advanced directives . List of other physicians . Hospitalizations, surgeries, and ER visits in previous 12 months . Vitals . Screenings to include cognitive, depression, and falls . Referrals and appointments  In addition, I have reviewed and discussed with patient certain preventive protocols, quality metrics, and best practice recommendations. A written personalized care plan for preventive services as well as general preventive health recommendations were provided to patient.     Gerilyn Nestle, RN  12/03/2018   F/U with PCP 02/2019 Medical screening examination/treatment/procedure(s) were performed by non-physician practitioner and as supervising physician I was immediately available for consultation/collaboration.  I agree with above assessment and plan.  Electronically Signed by: Howard Pouch, DO [place] [facility] [place]

## 2018-12-05 ENCOUNTER — Ambulatory Visit: Payer: Medicare Other | Admitting: Orthotics

## 2018-12-05 ENCOUNTER — Other Ambulatory Visit: Payer: Self-pay

## 2018-12-05 DIAGNOSIS — M2042 Other hammer toe(s) (acquired), left foot: Secondary | ICD-10-CM

## 2018-12-05 DIAGNOSIS — E1142 Type 2 diabetes mellitus with diabetic polyneuropathy: Secondary | ICD-10-CM

## 2018-12-05 DIAGNOSIS — M2041 Other hammer toe(s) (acquired), right foot: Secondary | ICD-10-CM

## 2018-12-05 NOTE — Progress Notes (Signed)

## 2018-12-06 NOTE — Telephone Encounter (Signed)
LMOM for patient to call and schedule follow-up appointment in January 2021. °

## 2018-12-09 ENCOUNTER — Other Ambulatory Visit: Payer: Self-pay

## 2018-12-09 ENCOUNTER — Encounter: Payer: Self-pay | Admitting: Family Medicine

## 2018-12-09 ENCOUNTER — Ambulatory Visit (INDEPENDENT_AMBULATORY_CARE_PROVIDER_SITE_OTHER): Payer: Medicare Other | Admitting: Family Medicine

## 2018-12-09 VITALS — Ht 64.0 in

## 2018-12-09 DIAGNOSIS — J45901 Unspecified asthma with (acute) exacerbation: Secondary | ICD-10-CM

## 2018-12-09 DIAGNOSIS — R0602 Shortness of breath: Secondary | ICD-10-CM

## 2018-12-09 NOTE — Progress Notes (Signed)
VIRTUAL VISIT VIA VIDEO  I connected with Priscilla [name] on 12/09/18 at  4:00 PM EDT by a video enabled telemedicine application and verified that I am speaking with the correct person using two identifiers. Location patient: Home Location provider: [facility], Office Persons participating in the virtual visit: Patient, Dr. Raoul Pitch and R.Baker, LPN  I discussed the limitations of evaluation and management by telemedicine and the availability of in person appointments. The patient expressed understanding and agreed to proceed.   SUBJECTIVE Chief Complaint  Patient presents with  . Asthma    x3 weeks. Pt has had cough with getting worse the past 5 days. Pt has been using inhaler. She has a sore spot on right side of the chest    HPI: Priscilla [name] is a 68 y.o. presents today to discuss her shortness of breath which has been worse for the past 3 weeks.   She has been using her albuterol inhaler a few times a day which seems to be helping.  She states she has a soreness in the right side of her lungs that can radiate to her back.  She endorses the shortness of breath seems to be worse when she sitting more so than when she is up and moving.  SHe feels like she cannot get a full breath and she has been yawning more.  She denies wheezing.  But her daughter states that she has been wheezing.  She does have mild dizziness chronically.  She did travel to the beach 1 month ago.  No known exposure to coronavirus.  No other family members have been ill.  She denies any lower extremity swelling, redness or tenderness.  ROS: See pertinent positives and negatives per HPI.  Patient Active Problem List   Diagnosis Date Noted  . Type 2 diabetes mellitus with retinopathy, with long-term current use of insulin ([place]) 05/17/2018  . Overweight (BMI 25.0-29.9) 02/11/2018  . Vitamin D deficiency 10/13/2016  . 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  Iron deficiency anemia 04/28/2016  . Other insomnia 03/14/2016  . DDD (degenerative disc disease), cervical 03/14/2016  . Spondylosis without myelopathy or radiculopathy, lumbar region 01/31/2016  . Medicare annual wellness visit, initial 08/06/2015  . Anxiety and depression 06/25/2014  . Psoriatic arthritis - sees rheumatologist 02/20/2014  . Asthma 12/14/2008  . Diabetic peripheral neuropathy associated with type 2 diabetes mellitus ([place]) 08/06/2008  . Hyperlipemia 11/12/2006  . Essential hypertension 11/12/2006    Social History   Tobacco Use  . Smoking status: Never Smoker  . Smokeless tobacco: Never Used  Substance Use Topics  . Alcohol use: Yes    Comment: glass of wine-special occasion    Current Outpatient Medications:  .  albuterol (PROVENTIL HFA;VENTOLIN HFA) 108 (90 Base) MCG/ACT inhaler, Inhale 2 puffs into the lungs every 6 (six) hours as needed for wheezing or shortness of breath., Disp: 1 Inhaler, Rfl: 1 .  aspirin 81 MG tablet, Take 81 mg by mouth daily.  , Disp: , Rfl:  .  Biotin 5000 MCG TABS, Take 5,000 mcg by mouth daily. , Disp: , Rfl:  .  calcium-vitamin D 250-100 MG-UNIT per tablet, Take 1 tablet by mouth daily. , Disp: , Rfl:  .  Cyanocobalamin (VITAMIN B12 PO), Place under the tongue., Disp: , Rfl:  .  DULoxetine (CYMBALTA) 30 MG capsule, TAKE 1 CAPSULE BY MOUTH EVERY DAY, Disp: 90 capsule, Rfl: 1 .  DULoxetine (CYMBALTA) 60  MG capsule, TAKE 1 CAPSULE BY MOUTH EVERY DAY, Disp: 90 capsule, Rfl: 1 .  fluticasone (FLONASE) 50 MCG/ACT nasal spray, Place 2 sprays into both nostrils daily., Disp: 16 g, Rfl: 6 .  gabapentin (NEURONTIN) 100 MG capsule, TAKE 1 CAPSULE BY MOUTH NIGHTLY AT BEDTIME, Disp: 30 capsule, Rfl: 1 .  glucagon (GLUCAGON EMERGENCY) 1 MG injection, Inject 1 mg into the muscle once as needed., Disp: 1 each, Rfl: 12 .  glucose blood (ONETOUCH VERIO) test strip, USE TO TEST BLOOD SUGAR TWICE A DAY DX: E11.319, Disp: 200 each, Rfl: 0 .  Insulin Pen  Needle 32G X 6 MM MISC, Use to inject Novolog 3 times daily DX: E11.319, Disp: 300 each, Rfl: 1 .  Insulin Syringe-Needle U-100 (B-D INS SYR MICROFINE 1CC/27G) 27G X 5/8" 1 ML MISC, Use to inject levemir once daily DX: E11.319, Disp: 120 each, Rfl: 6 .  LEVEMIR 100 UNIT/ML injection, INJECT 0.3 ML (30 UNITS) INTO THE SKIN AT BEDTIME, Disp: 30 mL, Rfl: 3 .  lisinopril (ZESTRIL) 2.5 MG tablet, Take 1 tablet (2.5 mg total) by mouth daily., Disp: 90 tablet, Rfl: 1 .  metFORMIN (GLUCOPHAGE) 1000 MG tablet, TAKE 1 TABLET BY MOUTH 2 TIMES DAILY WITH MEALS, Disp: 60 tablet, Rfl: 2 .  methocarbamol (ROBAXIN) 500 MG tablet, Take 1 tablet (500 mg total) by mouth daily as needed for muscle spasms., Disp: 30 tablet, Rfl: 0 .  metoprolol tartrate (LOPRESSOR) 25 MG tablet, Take 1 tablet (25 mg total) by mouth 2 (two) times daily., Disp: 180 tablet, Rfl: 1 .  Multiple Vitamin (MULTIVITAMIN) tablet, Take 1 tablet by mouth daily. , Disp: , Rfl:  .  NOVOLOG FLEXPEN 100 UNIT/ML FlexPen, INJECT 6 TO 8 UNITS UNDER THE SKIN THREE TIMES DAILY WITH MEALS, PLUS SLIDING SCALE, Disp: 15 mL, Rfl: 0 .  ONE TOUCH LANCETS MISC, USE TO TEST BLOOD SUGAR TWICE A DAY DX: E11.319, Disp: 200 each, Rfl: 1 .  pravastatin (PRAVACHOL) 20 MG tablet, Take 1 tablet (20 mg total) by mouth daily., Disp: 90 tablet, Rfl: 3 .  Vitamin D, Ergocalciferol, (DRISDOL) 1.25 MG (50000 UT) CAPS capsule, TAKE 1 CAPSULE BY MOUTH every 7 DAYS, Disp: 15 capsule, Rfl: 3 .  zolpidem (AMBIEN) 5 MG tablet, TAKE 1 TABLET BY MOUTH AT BEDTIME as needed for insomnia, Disp: 30 tablet, Rfl: 0  No Known Allergies  OBJECTIVE: Ht 5\' 4"  (1.626 m)   LMP  (LMP Unknown)   BMI 28.69 kg/m  Gen: No acute distress. Nontoxic in appearance.  HENT: AT. [place].  MMM.  Eyes:Pupils Equal Round Reactive to light, Extraocular movements intact,  Conjunctiva without redness, discharge or icterus. CV: no edema Chest: Cough not present.  Talking in full sentences without shortness of  breath. Skin: No rashes, purpura or petechiae.  Neuro:  Normal gait. Alert. Oriented x3  Psych: Normal affect, dress and demeanor. Normal speech. Normal thought content and judgment.  ASSESSMENT AND PLAN: Priscilla [name] is a 68 y.o. female present for  Asthma with acute exacerbation, unspecified asthma severity, unspecified whether persistent/shortness of breath Possible asthma flare since it is responding to albuterol inhalers.  However would like to collect labs and chest x-ray along with novel coronavirus testing giving her signs and symptoms.  She traveled just prior to the onset of her symptoms.  Also should rule out PE. - CBC w/Diff; Future - Novel Coronavirus, NAA ([facility]) - DG Chest 2 View; Future - D-Dimer, Quantitative; Future -Discussed treatment plan with her today  and will have her collect novel coronavirus labs, and then obtain chest x-ray and labs at our [place] office.  Once I get results back from the chest x-ray, we will move forward with initiating appropriate treatment.  Patient was encouraged to report to the emergency room if shortness of breath worsens during this time.  Patient reports understanding of plan.  > 25 minutes spent with patient, >50% of time spent face to face    Howard Pouch, DO 12/09/2018

## 2018-12-10 ENCOUNTER — Other Ambulatory Visit (INDEPENDENT_AMBULATORY_CARE_PROVIDER_SITE_OTHER): Payer: Medicare Other

## 2018-12-10 ENCOUNTER — Telehealth: Payer: Self-pay | Admitting: Family Medicine

## 2018-12-10 ENCOUNTER — Ambulatory Visit (INDEPENDENT_AMBULATORY_CARE_PROVIDER_SITE_OTHER)
Admission: RE | Admit: 2018-12-10 | Discharge: 2018-12-10 | Disposition: A | Discharge status: Home / Self Care | Payer: Medicare Other | Source: Ambulatory Visit | Attending: Family Medicine | Admitting: Family Medicine

## 2018-12-10 ENCOUNTER — Other Ambulatory Visit: Payer: Self-pay | Admitting: Emergency Medicine

## 2018-12-10 DIAGNOSIS — Z20822 Contact with and (suspected) exposure to covid-19: Secondary | ICD-10-CM

## 2018-12-10 DIAGNOSIS — R6889 Other general symptoms and signs: Secondary | ICD-10-CM | POA: Diagnosis not present

## 2018-12-10 DIAGNOSIS — R0602 Shortness of breath: Secondary | ICD-10-CM | POA: Diagnosis not present

## 2018-12-10 LAB — CBC WITH DIFFERENTIAL/PLATELET
Basophils Absolute: 0.1 10*3/uL (ref 0.0–0.1)
Basophils Relative: 0.9 % (ref 0.0–3.0)
Eosinophils Absolute: 0.3 10*3/uL (ref 0.0–0.7)
Eosinophils Relative: 3.4 % (ref 0.0–5.0)
HCT: 43.3 % (ref 36.0–46.0)
Hemoglobin: 14.5 g/dL (ref 12.0–15.0)
Lymphocytes Relative: 26.7 % (ref 12.0–46.0)
Lymphs Abs: 2.1 10*3/uL (ref 0.7–4.0)
MCHC: 33.4 g/dL (ref 30.0–36.0)
MCV: 89.5 fl (ref 78.0–100.0)
Monocytes Absolute: 0.7 10*3/uL (ref 0.1–1.0)
Monocytes Relative: 8.2 % (ref 3.0–12.0)
Neutro Abs: 4.8 10*3/uL (ref 1.4–7.7)
Neutrophils Relative %: 60.8 % (ref 43.0–77.0)
Platelets: 302 10*3/uL (ref 150.0–400.0)
RBC: 4.84 Mil/uL (ref 3.87–5.11)
RDW: 13.1 % (ref 11.5–15.5)
WBC: 8 10*3/uL (ref 4.0–10.5)

## 2018-12-10 MED ORDER — AZITHROMYCIN 250 MG PO TABS: ORAL_TABLET | ORAL | 0 refills | Status: DC

## 2018-12-10 MED ORDER — PREDNISONE 50 MG PO TABS: 50.0000 mg | ORAL_TABLET | Freq: Every day | ORAL | 0 refills | Status: DC

## 2018-12-10 NOTE — Telephone Encounter (Signed)
Pt was called and given results. She verbalized understanding  

## 2018-12-10 NOTE — Telephone Encounter (Signed)
Please inform patient the following information: Her cxr is clear and her CBC/blood counts are normal. This makes covid a less likely cause- however can not completely rule out until results received.  Her D-Dimer- which is the test to rule out blood clot is also pending and probably will not be back today.    Since cbc and cxr looks reassuring- will start treatment with short course of prednisone and z-pack>> assumed tx for bronchitis or asthma flare. Continue use of albuterol inhaler every 4 hours if needed.  If symptoms worsen before other results are in - please go to ED and do not wait on labs.

## 2018-12-11 LAB — D-DIMER, QUANTITATIVE: D-Dimer, Quant: 0.44 mcg/mL FEU (ref <0.50)

## 2018-12-12 ENCOUNTER — Other Ambulatory Visit: Payer: Self-pay | Admitting: Internal Medicine

## 2018-12-12 LAB — NOVEL CORONAVIRUS, NAA: SARS-CoV-2, NAA: NOT DETECTED

## 2018-12-13 ENCOUNTER — Other Ambulatory Visit: Payer: Self-pay | Admitting: Physician Assistant

## 2018-12-13 NOTE — Telephone Encounter (Signed)
Please schedule patient for a follow up visit. Patient due January 2021. Thanks! 

## 2018-12-13 NOTE — Telephone Encounter (Signed)
Last Visit: 10/15/18  Next Visit: due January 2021. Message sent to the front to schedule  Okay to refill Ambien?

## 2018-12-13 NOTE — Telephone Encounter (Signed)
I LMOM for patient to call, and schedule her next follow up appt with Dr. Estanislado Pandy in January 2021.

## 2018-12-19 ENCOUNTER — Encounter: Payer: Self-pay | Admitting: Family Medicine

## 2018-12-19 ENCOUNTER — Telehealth: Payer: Self-pay

## 2018-12-19 NOTE — Telephone Encounter (Signed)
Pts heart rate is going up to 95-120 with small amount of activity. She said it comes in spells and she is getting sweats and nervousness feeling. Started Sunday happening Sunday. Finished with steroid and has not used inhaler since Friday. Denies chest pain and that has cleared up. Denies fever. Pt has slight SOB with activity but has not needed inhaler and thinks she is still recovering from last week.  Pt was advised if she had chest pain, SOB, increased heart rate that didn't subside after rest to call 911 or go to nearest ED.    Please advise. No appts open tomorrow.

## 2018-12-19 NOTE — Telephone Encounter (Signed)
This can be left over symptoms from illness or even side effect from the steroid. If she would like to be seen virtually tomorrow we can work her in at 845. Otherwise, I would give herself a few more days to get over the illness and if still not improved after the weekend we will need to see her in person for follow up

## 2018-12-19 NOTE — Telephone Encounter (Signed)
Called patient and lvm for her to call me back

## 2018-12-19 NOTE — Telephone Encounter (Signed)
Pt was called and given information. She will wait until after the weekend to see if she has improvement

## 2018-12-26 ENCOUNTER — Encounter: Payer: Self-pay | Admitting: Podiatry

## 2018-12-26 ENCOUNTER — Ambulatory Visit (INDEPENDENT_AMBULATORY_CARE_PROVIDER_SITE_OTHER): Payer: Medicare Other | Admitting: Podiatry

## 2018-12-26 ENCOUNTER — Other Ambulatory Visit: Payer: Self-pay

## 2018-12-26 DIAGNOSIS — L603 Nail dystrophy: Secondary | ICD-10-CM | POA: Diagnosis not present

## 2018-12-26 DIAGNOSIS — Z79899 Other long term (current) drug therapy: Secondary | ICD-10-CM | POA: Diagnosis not present

## 2018-12-26 MED ORDER — TERBINAFINE HCL 250 MG PO TABS: 250.0000 mg | ORAL_TABLET | Freq: Every day | ORAL | 0 refills | Status: DC

## 2018-12-26 NOTE — Progress Notes (Signed)
She presents today for follow-up of her nail pathology.  Objective: No pathology report does demonstrate typical T rubrum onychomycosis.  Assessment: Onychomycosis.  Plan: Discussed etiology pathology conservative versus surgical therapies.  At this point we discussed topical therapies laser therapy and oral therapy.  She understands that with oral therapy we will have to do liver enzymes and she is amendable to this.  She does understand that she is taking statin drug which may interfere with the medication and we will monitor that closely.  At this point we reck requested a requisition for blood work for liver profile.  I also wrote a prescription for 30 days of Lamisil discussed the pros and cons of the use of this Lamisil I will follow-up with her in 1 month.  She will call with questions or concerns or any side effects.

## 2018-12-30 DIAGNOSIS — Z1211 Encounter for screening for malignant neoplasm of colon: Secondary | ICD-10-CM | POA: Diagnosis not present

## 2018-12-31 DIAGNOSIS — Z79899 Other long term (current) drug therapy: Secondary | ICD-10-CM | POA: Diagnosis not present

## 2019-01-01 ENCOUNTER — Telehealth: Payer: Self-pay

## 2019-01-01 ENCOUNTER — Telehealth: Payer: Self-pay | Admitting: *Deleted

## 2019-01-01 LAB — HEPATIC FUNCTION PANEL
AG Ratio: 1.7 (calc) (ref 1.0–2.5)
ALT: 11 U/L (ref 6–29)
AST: 11 U/L (ref 10–35)
Albumin: 4 g/dL (ref 3.6–5.1)
Alkaline phosphatase (APISO): 52 U/L (ref 37–153)
Bilirubin, Direct: 0.1 mg/dL (ref 0.0–0.2)
Globulin: 2.4 g/dL (calc) (ref 1.9–3.7)
Indirect Bilirubin: 0.2 mg/dL (calc) (ref 0.2–1.2)
Total Bilirubin: 0.3 mg/dL (ref 0.2–1.2)
Total Protein: 6.4 g/dL (ref 6.1–8.1)

## 2019-01-01 MED ORDER — TERBINAFINE HCL 250 MG PO TABS: 250.0000 mg | ORAL_TABLET | Freq: Every day | ORAL | 0 refills | Status: DC

## 2019-01-01 NOTE — Telephone Encounter (Signed)
Received Fax from [facility] requesting signature for approval for therapeutic shoes/inserts. Most recent DM exam placed with forms. Placed on Dr Raoul Pitch desk for review.

## 2019-01-01 NOTE — Telephone Encounter (Signed)
Faxed form and sent to scan.

## 2019-01-01 NOTE — Telephone Encounter (Signed)
-----   Message from Garrel Ridgel, [place] sent at 01/01/2019 12:27 PM EDT ----- Blood work looks perfect and may continue medication.

## 2019-01-01 NOTE — Telephone Encounter (Signed)
Completed form and returned to satff

## 2019-01-03 LAB — COLOGUARD: Cologuard: NEGATIVE

## 2019-01-06 ENCOUNTER — Other Ambulatory Visit: Payer: Self-pay

## 2019-01-06 DIAGNOSIS — Z1211 Encounter for screening for malignant neoplasm of colon: Secondary | ICD-10-CM

## 2019-01-10 ENCOUNTER — Other Ambulatory Visit: Payer: Medicare Other | Admitting: Orthotics

## 2019-01-10 ENCOUNTER — Other Ambulatory Visit: Payer: Self-pay

## 2019-01-10 ENCOUNTER — Encounter: Payer: Self-pay | Admitting: Gastroenterology

## 2019-01-10 DIAGNOSIS — E1142 Type 2 diabetes mellitus with diabetic polyneuropathy: Secondary | ICD-10-CM | POA: Diagnosis not present

## 2019-01-10 DIAGNOSIS — M2041 Other hammer toe(s) (acquired), right foot: Secondary | ICD-10-CM | POA: Diagnosis not present

## 2019-01-10 DIAGNOSIS — M2042 Other hammer toe(s) (acquired), left foot: Secondary | ICD-10-CM | POA: Diagnosis not present

## 2019-01-11 ENCOUNTER — Other Ambulatory Visit: Payer: Self-pay | Admitting: Physician Assistant

## 2019-01-13 NOTE — Telephone Encounter (Signed)
Last Visit: 10/15/18  Next Visit: 04/23/19  Okay to refill Ambien?

## 2019-01-20 ENCOUNTER — Telehealth: Payer: Self-pay | Admitting: *Deleted

## 2019-01-20 NOTE — Telephone Encounter (Signed)
Pt asked if her blood work needed to be done prior to her appt and if it would be back in time for the appt.

## 2019-01-20 NOTE — Telephone Encounter (Signed)
Left message informing pt the blood work ordered 12/26/2018 had been finaled 12/31/2018 and she would benefit from not having blood work drawn, until it was decided she needed to continue the medication.

## 2019-01-23 ENCOUNTER — Other Ambulatory Visit: Payer: Self-pay

## 2019-01-23 ENCOUNTER — Encounter: Payer: Self-pay | Admitting: Podiatry

## 2019-01-23 ENCOUNTER — Ambulatory Visit (INDEPENDENT_AMBULATORY_CARE_PROVIDER_SITE_OTHER): Payer: Medicare Other | Admitting: Podiatry

## 2019-01-23 DIAGNOSIS — Z79899 Other long term (current) drug therapy: Secondary | ICD-10-CM | POA: Diagnosis not present

## 2019-01-23 DIAGNOSIS — L603 Nail dystrophy: Secondary | ICD-10-CM | POA: Diagnosis not present

## 2019-01-23 MED ORDER — TERBINAFINE HCL 250 MG PO TABS: 250.0000 mg | ORAL_TABLET | Freq: Every day | ORAL | 0 refills | Status: DC

## 2019-01-23 NOTE — Progress Notes (Signed)
She presents today for follow-up of her first 30 days of Lamisil therapy.  She denies fever chills nausea vomiting muscle aches pains itching or any rashes developing.  States that the toenails have not changed a lot yet but the bottom of her feet seem to be drier.  Objective: Vital signs are stable alert and oriented x3.  Pulses are palpable.  There is no erythema edema cellulitis drainage or odor.  Be healing very nicely.  Assessment: Long-term therapy of onychomycosis with Lamisil.  Plan: At this point requested another liver profile and send over another 90 days worth of Lamisil.  Should she have any questions or concerns regarding this she will notify us immediately.  Otherwise we will follow up with her in 4 months

## 2019-01-24 DIAGNOSIS — Z79899 Other long term (current) drug therapy: Secondary | ICD-10-CM | POA: Diagnosis not present

## 2019-01-25 LAB — HEPATIC FUNCTION PANEL
AG Ratio: 1.7 (calc) (ref 1.0–2.5)
ALT: 17 U/L (ref 6–29)
AST: 15 U/L (ref 10–35)
Albumin: 4.1 g/dL (ref 3.6–5.1)
Alkaline phosphatase (APISO): 56 U/L (ref 37–153)
Bilirubin, Direct: 0.1 mg/dL (ref 0.0–0.2)
Globulin: 2.4 g/dL (calc) (ref 1.9–3.7)
Indirect Bilirubin: 0.4 mg/dL (calc) (ref 0.2–1.2)
Total Bilirubin: 0.5 mg/dL (ref 0.2–1.2)
Total Protein: 6.5 g/dL (ref 6.1–8.1)

## 2019-02-03 ENCOUNTER — Telehealth: Payer: Self-pay | Admitting: *Deleted

## 2019-02-03 NOTE — Telephone Encounter (Signed)
Left message informing pt of Dr. Stephenie Acres review of results.

## 2019-02-03 NOTE — Telephone Encounter (Signed)
-----   Message from Garrel Ridgel, [place] sent at 02/03/2019 12:28 PM EDT ----- Blood work looks good may continue medication.

## 2019-02-11 ENCOUNTER — Other Ambulatory Visit: Payer: Self-pay | Admitting: Family Medicine

## 2019-02-11 ENCOUNTER — Other Ambulatory Visit: Payer: Self-pay | Admitting: Physician Assistant

## 2019-02-11 ENCOUNTER — Other Ambulatory Visit: Payer: Self-pay | Admitting: Podiatry

## 2019-02-11 DIAGNOSIS — F419 Anxiety disorder, unspecified: Secondary | ICD-10-CM

## 2019-02-11 DIAGNOSIS — E785 Hyperlipidemia, unspecified: Secondary | ICD-10-CM

## 2019-02-11 DIAGNOSIS — F329 Major depressive disorder, single episode, unspecified: Secondary | ICD-10-CM

## 2019-02-11 NOTE — Telephone Encounter (Signed)
Last Visit: 10/15/18  Next Visit: 04/23/19  Okay to refill Ambien?

## 2019-02-13 ENCOUNTER — Other Ambulatory Visit: Payer: Self-pay

## 2019-02-13 ENCOUNTER — Ambulatory Visit (INDEPENDENT_AMBULATORY_CARE_PROVIDER_SITE_OTHER): Payer: Medicare Other | Admitting: Internal Medicine

## 2019-02-13 ENCOUNTER — Encounter: Payer: Self-pay | Admitting: Internal Medicine

## 2019-02-13 VITALS — BP 118/60 | HR 79 | Ht 64.0 in | Wt 169.0 lb

## 2019-02-13 DIAGNOSIS — E1142 Type 2 diabetes mellitus with diabetic polyneuropathy: Secondary | ICD-10-CM | POA: Diagnosis not present

## 2019-02-13 DIAGNOSIS — Z794 Long term (current) use of insulin: Secondary | ICD-10-CM | POA: Diagnosis not present

## 2019-02-13 DIAGNOSIS — E11319 Type 2 diabetes mellitus with unspecified diabetic retinopathy without macular edema: Secondary | ICD-10-CM

## 2019-02-13 DIAGNOSIS — E785 Hyperlipidemia, unspecified: Secondary | ICD-10-CM

## 2019-02-13 LAB — POCT GLYCOSYLATED HEMOGLOBIN (HGB A1C): Hemoglobin A1C: 6 % — AB (ref 4.0–5.6)

## 2019-02-13 MED ORDER — METFORMIN HCL 1000 MG PO TABS: ORAL_TABLET | ORAL | 2 refills | Status: DC

## 2019-02-13 MED ORDER — DEXCOM G6 RECEIVER DEVI: 1.0000 | Freq: Once | 0 refills | Status: AC

## 2019-02-13 MED ORDER — DEXCOM G6 TRANSMITTER MISC: 1.0000 | 3 refills | Status: DC

## 2019-02-13 MED ORDER — DEXCOM G6 SENSOR MISC: 1.0000 | 3 refills | Status: AC

## 2019-02-13 NOTE — Addendum Note (Signed)
Addended by: Cardell Peach I on: 02/13/2019 04:34 PM   Modules accepted: Orders

## 2019-02-13 NOTE — Patient Instructions (Addendum)
Please continue: - Metformin 1000 mg 2x a day with meals - Mealtime Novolog:  -4-6 units before a smaller meal -8-10 units before a regular meal -12(-14) units before a large meal  - Sliding scale Novolog: - 151-175: + 1 unit  - 176-200: + 2 units  - 201-225: + 3 units  - 226-250: + 4 units  - >250: + 5 units If sugars before the meal are 60 or lower, please do not take the Novolog dose. If sugars before the meal are 61-80, take only half of the Novolog dose.  Please split Levemir into 15 units 2x a day.  Try to get the Dexcom G6 CGM.  Please come back for a follow-up appointment in 4 months.

## 2019-02-13 NOTE — Progress Notes (Signed)
Patient ID: Priscilla [name], female   DOB: 05-28-1950, 68 y.o.   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 She previously saw Drs Dwyane Dee (distant past) and [place] (more recently). Last visit with me 5 months ago.  At this visit, she continues to complain of lows at night, increased from before.  Latest HbA1c was: Lab Results  Component Value Date   HGBA1C 6.1 (A) 09/20/2018   HGBA1C 6.4 (A) 05/23/2018   HGBA1C 6.0 (A) 10/17/2017  03/16/2017: HbA1c calculated from fructosamine: 5.6% 10/18/2015: HbA1c calculated from fructosamine: 5.7% 02/2015: HbA1c 7.4%, HbA1c calculated from fructosamine: 5.6%  She is on: - Metformin 1000 mg 2x a day with meals - Levemir 30 units at night - Mealtime Novolog:  -4-6 units before a smaller meal -8-10 units before a regular meal -12-14 units before a large meal  - Sliding scale Novolog: - 151-175: + 1 unit  - 176-200: + 2 units  - 201-225: + 3 units  - 226-250: + 4 units  - >250: + 5 units If sugars before the meal are 60 or lower, please do not take the Novolog dose. If sugars before the meal are 61-80, take only half of the Novolog dose.  She was on Victoza 2.5 mg daily >> stopped as she could not afford this.  Pt checks her sugars 4x a day: - am:  82-130 >> 91-116,140-151 >> 100-130 >> 78-120 - 2h after b'fast: 120, 185 >> 109-178 >> 120s >> 119-164, 179 - before lunch: 79-119 >> 120-123 >> 95-139 >> n/c >> 103-143 - 2h after lunch:  120-135 >> 83-123 >> 130s >> 145-170 - before dinner: 71, 112,148-247 >> 100-130 >> 83-110, 153 - 2h after dinner: 140-170, 229 >> 170s, occas.220 >> 61-224 - bedtime: 106-160 >> 108-156, 200 >> 58  >> 57-175 - nighttime: 46-70 >> 50 (?) >> 50s x1 (? Cause) >> 42-58, even 4 times a week! Lowest sugar was 46 >> 50-at night >> 50s >> 42; she has hypoglycemia  awareness in the 70s.  She does have a glucagon kit at home. Highest sugar was 285 x1 (cake) >> 247 >> 224.  Glucometer: Freestyle  -No CKD; last BUN/creatinine:  Lab Results  Component Value Date   BUN 15 02/11/2018   CREATININE 0.74 02/11/2018  ACR (12/2014): 6.9 On lisinopril 1.25 mg daily -+ HL; last set of lipids: Lab Results  Component Value Date   CHOL 187 02/11/2018   HDL 61.40 02/11/2018   LDLCALC 107 (H) 02/11/2018   TRIG 92.0 02/11/2018   CHOLHDL 3 02/11/2018  On pravastatin. 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  She has floaters. -+ improved Numbness and tingling in her feet, and also improve burning in her feet.  She takes a B12 supplement.  I recommended alpha lipoic acid but she did not take this due to the large size of the pill. She sees the podiatrist. On Neurontin 100 mg daily.  Latest TSH was normal: Lab Results  Component Value Date   TSH 2.08 04/25/2017   She also has a history of lap band surgery >> she does not usually eat large meals but she usually grazes, especially at night.  ROS: Constitutional: + weight gain/no weight loss, no fatigue, no subjective hyperthermia,  no subjective hypothermia Eyes: no blurry vision, no xerophthalmia ENT: no sore throat, no nodules palpated in neck, no dysphagia, no odynophagia, no hoarseness Cardiovascular: no CP/no SOB/no palpitations/no leg swelling Respiratory: no cough/no SOB/no wheezing Gastrointestinal: no N/no V/no D/no C/no acid reflux Musculoskeletal: no muscle aches/no joint aches Skin: no rashes, no hair loss Neurological: no tremors/+ numbness/+ tingling/no dizziness  I reviewed pt's medications, allergies, PMH, social hx, family hx, and changes were documented in the history of present illness. Otherwise, unchanged from my initial visit note.  Past Medical History:  Diagnosis Date  . Anemia 12/14/2008  .  Anxiety and depression 12/14/2008  . Asthma 11/12/2006  . CAD (coronary artery disease) 12/14/2008   hx transluminal coronary angioplasty  . Cataract   . Depression   . Diabetes mellitus with neuropathy ([place]) 12/14/2008  . Fibromyalgia 01/04/2007   sees rheuamtology  . 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 Hyperlipemia 01/21/2010  . Hypertension 12/14/2008  . Leg edema 12/14/2008  . 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 OSA (obstructive sleep apnea) 08/06/2008  . Osteoarthritis 12/14/2008  . Palpitations 11/12/2006   hx sinus tachy  . Psoriatic arthritis [facility])    sees rheumatology  . Rotator cuff injury    Past Surgical History:  Procedure Laterality Date  . ABDOMINAL HYSTERECTOMY    . CHOLECYSTECTOMY    . LAPAROSCOPIC GASTRIC BANDING    . PTCA    . WRIST SURGERY     LEFT   Social History   Social History  . Marital Status: Married    Spouse Name: N/A  . Number of Children: N/A   Occupational History  . Not on file.   Social History Main Topics  . Smoking status: Never Smoker   . Smokeless tobacco: Never Used  . Alcohol Use: Yes     Comment: glass of wine-specially occasion  . Drug Use: No   Current Outpatient Medications on File Prior to Visit  Medication Sig Dispense Refill  . albuterol (PROVENTIL HFA;VENTOLIN HFA) 108 (90 Base) MCG/ACT inhaler Inhale 2 puffs into the lungs every 6 (six) hours as needed for wheezing or shortness of breath. 1 Inhaler 1  . aspirin 81 MG tablet Take 81 mg by mouth daily.      Marland Kitchen azithromycin (ZITHROMAX) 250 MG tablet 500 mg day 1 , then 250 mg QD 6 tablet 0  . Biotin 5000 MCG TABS Take 5,000 mcg by mouth daily.     . calcium-vitamin D 250-100 MG-UNIT per tablet Take 1 tablet by mouth daily.     . Cyanocobalamin (VITAMIN B12 PO) Place under the tongue.    . DULoxetine (CYMBALTA) 30 MG capsule TAKE 1 CAPSULE BY MOUTH EVERY DAY 90 capsule 1  . DULoxetine (CYMBALTA) 60 MG capsule TAKE 1 CAPSULE BY MOUTH EVERY DAY 16 capsule 0  . fluticasone  (FLONASE) 50 MCG/ACT nasal spray Place 2 sprays into both nostrils daily. 16 g 6  . gabapentin (NEURONTIN) 100 MG capsule TAKE 1 CAPSULE BY MOUTH NIGHTLY AT BEDTIME 30 capsule 1  . glucagon (GLUCAGON EMERGENCY) 1 MG injection Inject 1 mg into the muscle once as needed. 1 each 12  . glucose blood (ONETOUCH VERIO) test strip USE TO TEST BLOOD SUGAR TWICE A DAY DX: E11.319 200 each 0  . Insulin Pen Needle (NOVOFINE) 32G X 6 MM MISC USE TO INJECT NOVOLOG THREE TIMES DAILY 300 each 1  . Insulin Syringe-Needle U-100 (B-D INS SYR MICROFINE 1CC/27G) 27G X 5/8" 1 ML MISC  Use to inject levemir once daily DX: E11.319 120 each 6  . LEVEMIR 100 UNIT/ML injection INJECT 0.3 ML (30 UNITS) INTO THE SKIN AT BEDTIME 30 mL 3  . lisinopril (ZESTRIL) 2.5 MG tablet Take 1 tablet (2.5 mg total) by mouth daily. 90 tablet 1  . metFORMIN (GLUCOPHAGE) 1000 MG tablet TAKE 1 TABLET BY MOUTH 2 TIMES DAILY WITH MEALS 60 tablet 2  . methocarbamol (ROBAXIN) 500 MG tablet Take 1 tablet (500 mg total) by mouth daily as needed for muscle spasms. 30 tablet 0  . metoprolol tartrate (LOPRESSOR) 25 MG tablet TAKE 1 TABLET BY MOUTH 2 TIMES DAILY 32 tablet 0  . Multiple Vitamin (MULTIVITAMIN) tablet Take 1 tablet by mouth daily.     Marland Kitchen NOVOLOG FLEXPEN 100 UNIT/ML FlexPen INJECT 6 TO 8 UNITS UNDER THE SKIN THREE TIMES DAILY WITH MEALS, PLUS SLIDING SCALE 15 mL 0  . ONE TOUCH LANCETS MISC USE TO TEST BLOOD SUGAR TWICE A DAY DX: E11.319 200 each 1  . pravastatin (PRAVACHOL) 20 MG tablet TAKE 1 TABLET BY MOUTH EVERY DAY 16 tablet 0  . predniSONE (DELTASONE) 50 MG tablet Take 1 tablet (50 mg total) by mouth daily with breakfast. 5 tablet 0  . terbinafine (LAMISIL) 250 MG tablet Take 1 tablet (250 mg total) by mouth daily. 90 tablet 0  . Vitamin D, Ergocalciferol, (DRISDOL) 1.25 MG (50000 UT) CAPS capsule TAKE 1 CAPSULE BY MOUTH every 7 DAYS 15 capsule 3  . zolpidem (AMBIEN) 5 MG tablet TAKE 1 TABLET BY MOUTH AT BEDTIME AS NEEDED FOR INSOMNIA 30  tablet 0   No current facility-administered medications on file prior to visit.    No Known Allergies Family History  Problem Relation Age of Onset  . Arthritis Mother   . Alzheimer's disease Mother   . Stroke Father   . Alcohol abuse Father   . Diabetes Father   . Heart disease Father   . Early death Father 68  . Diabetes Sister   . Arthritis Sister   . COPD Sister   . Arthritis Maternal Aunt   . Diabetes Maternal Aunt   . Early death Maternal Aunt 13  . Diabetes Maternal Uncle   . Cancer Paternal Aunt   . COPD Paternal 40   . Heart disease Paternal Aunt   . Heart disease Paternal Uncle   . Alcohol abuse Paternal Grandfather   . Heart disease Paternal Grandfather   . Early death Paternal Grandfather 69  . Stroke Paternal Grandfather   . Hypertension Son   . Autoimmune disease Daughter   . Breast cancer Neg Hx    PE: BP 118/60   Pulse 79   Ht '5\' 4"'  (1.626 m)   Wt 169 lb (76.7 kg)   LMP  (LMP Unknown)   SpO2 98%   BMI 29.01 kg/m  Body mass index is 29.01 kg/m. Wt Readings from Last 3 Encounters:  02/13/19 169 lb (76.7 kg)  12/03/18 167 lb 2 oz (75.8 kg)  11/13/18 180 lb (81.6 kg)   Constitutional: overweight, in NAD Eyes: PERRLA, EOMI, no exophthalmos ENT: moist mucous membranes, no thyromegaly, no cervical lymphadenopathy Cardiovascular: RRR, No MRG Respiratory: CTA B Gastrointestinal: abdomen soft, NT, ND, BS+ Musculoskeletal: no deformities, strength intact in all 4 Skin: moist, warm, no rashes Neurological: no tremor with outstretched hands, DTR normal in all 4  ASSESSMENT: 1. DM2, insulin-dependent, uncontrolled, with complications - Diabetic retinopathy left eye - resolved - Peripheral neuropathy  2. HL  3.  PN   PLAN:  1. Patient with longstanding, uncontrolled, type 2 diabetes, on metformin and basal-bolus insulin regimen, with fairly good control.  Her sugars are usually at goal with the exception of evening CBGs, which are higher.  We are  less strict with her NovoLog in the evenings and she occasionally can drop her sugars overnight without an explanation.   At this visit, her nocturnal hypoglycemia has intensified.  Her sugars at bedtime may not necessarily be low but she frequently wakes up at night with sugars in the 40s and 50s.  These are very worrisome.  At this visit, we will split the Levemir into 2 doses, we discussed about not taking more than 10 units of NovoLog at dinnertime but I also recommended a CGM.  I sent a prescription for Dexcom G6 CGM to the pharmacy.  This should be covered by Medicare especially in the setting of her significant hypoglycemia episodes. -Before last in person visit, her HbA1c was better, at 6%.  We could not check her HbA1c at last visit since this was a virtual appointment. - I advised her to:  Patient Instructions  Please continue: - Metformin 1000 mg 2x a day with meals - Mealtime Novolog:  -4-6 units before a smaller meal -8-10 units before a regular meal -12(-14) units before a large meal  - Sliding scale Novolog: - 151-175: + 1 unit  - 176-200: + 2 units  - 201-225: + 3 units  - 226-250: + 4 units  - >250: + 5 units If sugars before the meal are 60 or lower, please do not take the Novolog dose. If sugars before the meal are 61-80, take only half of the Novolog dose.  Please split Levemir into 15 units 2x a day.  Try to get the Dexcom G6 CGM.  Please come back for a follow-up appointment in 4 months.  - we checked her HbA1c: 6.0% (lower) - advised to check sugars at different times of the day - 4x a day, rotating check times - advised for yearly eye exams >> she is UTD -She is up-to-date with flu shot - return to clinic in 4 months    2. HL -Reviewed latest lipid panel from 02/2018: LDL slightly above goal, the rest of the fractions at goal Lab Results  Component Value Date   CHOL 187 02/11/2018   HDL 61.40 02/11/2018   LDLCALC 107 (H) 02/11/2018   TRIG 92.0 02/11/2018    CHOLHDL 3 02/11/2018  -Continues the statin without side effects -She has appointment with PCP in 3 weeks for annual physical exam  3. PN -Likely secondary to diabetes -Continues B12 and Cymbalta and she was also started on Neurontin by podiatry.  She is on the low dose and this helps. -I suggested alpha-lipoic acid in the past but she could not take it due to the large size of the pill  Philemon Kingdom, MD PhD [facility] Endocrinology

## 2019-02-21 ENCOUNTER — Telehealth: Payer: Self-pay

## 2019-02-21 NOTE — Telephone Encounter (Signed)
Forms for Dexcom CGM Certificate of Medical Neceessity filled out, signed by Dr. Cruzita Lederer and faxed to [facility] with confirmation.

## 2019-02-26 ENCOUNTER — Ambulatory Visit: Payer: Medicare Other | Admitting: Family Medicine

## 2019-02-26 ENCOUNTER — Telehealth: Payer: Self-pay

## 2019-02-26 ENCOUNTER — Other Ambulatory Visit: Payer: Self-pay

## 2019-02-26 NOTE — Telephone Encounter (Signed)
Forms received from [facility] to supply patient with CGM. It has been filled out yesterday and I have faxed it 5 times. Each time I fax, it does not go through. This is a problem with [facility] so I spoke with patient and advised her of this and she agrees to go with another service.  Patient is on Medicare so I will send in the form to [facility].

## 2019-03-07 ENCOUNTER — Other Ambulatory Visit: Payer: Self-pay | Admitting: Family Medicine

## 2019-03-07 DIAGNOSIS — E785 Hyperlipidemia, unspecified: Secondary | ICD-10-CM

## 2019-03-07 DIAGNOSIS — F419 Anxiety disorder, unspecified: Secondary | ICD-10-CM

## 2019-03-07 DIAGNOSIS — I10 Essential (primary) hypertension: Secondary | ICD-10-CM

## 2019-03-07 DIAGNOSIS — F329 Major depressive disorder, single episode, unspecified: Secondary | ICD-10-CM

## 2019-03-11 NOTE — Telephone Encounter (Signed)
Pt was called and message was left to call back and schedule appt and we would be unable to get refill until she came for appt. . Pt was scheduled at the time the short supply of medication was called in and the patient cancelled that appt.

## 2019-03-11 NOTE — Telephone Encounter (Signed)
Please call patient. I have received refill request and pt is due for 6 mos follow up. Please schedule ASAP.  Requested: Pravastatin, metoprolol, cymbalta and lisinopril.  It appears she was given a few weeks script 11/3 by Glendale Chard- she should have been scheduled also at that time. Please ensure she has enough meds to get her to an appt and get her scheduled ASAP for follow up.  I did refilled the statin, others need follow up  thanks.

## 2019-03-14 ENCOUNTER — Encounter: Payer: Self-pay | Admitting: Family Medicine

## 2019-03-14 ENCOUNTER — Ambulatory Visit (INDEPENDENT_AMBULATORY_CARE_PROVIDER_SITE_OTHER): Payer: Medicare Other | Admitting: Family Medicine

## 2019-03-14 ENCOUNTER — Other Ambulatory Visit: Payer: Self-pay

## 2019-03-14 VITALS — BP 124/82 | HR 80 | Ht 64.0 in | Wt 168.0 lb

## 2019-03-14 DIAGNOSIS — F329 Major depressive disorder, single episode, unspecified: Secondary | ICD-10-CM

## 2019-03-14 DIAGNOSIS — E663 Overweight: Secondary | ICD-10-CM | POA: Diagnosis not present

## 2019-03-14 DIAGNOSIS — F32A Depression, unspecified: Secondary | ICD-10-CM

## 2019-03-14 DIAGNOSIS — I10 Essential (primary) hypertension: Secondary | ICD-10-CM | POA: Diagnosis not present

## 2019-03-14 DIAGNOSIS — E785 Hyperlipidemia, unspecified: Secondary | ICD-10-CM | POA: Diagnosis not present

## 2019-03-14 DIAGNOSIS — F419 Anxiety disorder, unspecified: Secondary | ICD-10-CM

## 2019-03-14 DIAGNOSIS — E559 Vitamin D deficiency, unspecified: Secondary | ICD-10-CM

## 2019-03-14 DIAGNOSIS — Z794 Long term (current) use of insulin: Secondary | ICD-10-CM

## 2019-03-14 DIAGNOSIS — E11319 Type 2 diabetes mellitus with unspecified diabetic retinopathy without macular edema: Secondary | ICD-10-CM | POA: Diagnosis not present

## 2019-03-14 DIAGNOSIS — E1142 Type 2 diabetes mellitus with diabetic polyneuropathy: Secondary | ICD-10-CM | POA: Diagnosis not present

## 2019-03-14 MED ORDER — METOPROLOL TARTRATE 25 MG PO TABS: 25.0000 mg | ORAL_TABLET | Freq: Two times a day (BID) | ORAL | 1 refills | Status: DC

## 2019-03-14 MED ORDER — DULOXETINE HCL 60 MG PO CPEP: 60.0000 mg | ORAL_CAPSULE | Freq: Every day | ORAL | 1 refills | Status: DC

## 2019-03-14 MED ORDER — LISINOPRIL 2.5 MG PO TABS: 2.5000 mg | ORAL_TABLET | Freq: Every day | ORAL | 1 refills | Status: DC

## 2019-03-14 MED ORDER — DULOXETINE HCL 30 MG PO CPEP: ORAL_CAPSULE | ORAL | 1 refills | Status: DC

## 2019-03-14 NOTE — Progress Notes (Signed)
VIRTUAL VISIT VIA VIDEO  I connected with Priscilla [name] on 03/14/2019 at 11:00 AM EST by a video enabled telemedicine application and verified that I am speaking with the correct person using two identifiers. Location patient: Home Location provider: [facility], Office Persons participating in the virtual visit: Patient, Dr. Raoul Pitch and R.Baker, LPN  I discussed the limitations of evaluation and management by telemedicine and the availability of in person appointments. The patient expressed understanding and agreed to proceed.   SUBJECTIVE Chief Complaint  Patient presents with  . Hypertension    Needs refills on medications. Was unable to get labs from Endocronologist and was hoping to have that lab work added to her regular labs     HPI:  Hypertension/hyperlipidemia: Pt reports  compliance with metoprolol BID and lisinopril 2.5 mg daily. Blood pressures ranges at home are not checked. Patient denies chest pain, shortness of breath, dizziness or lower extremity edema.  Pt takes a daily baby ASA. Pt is taking pravastatin. BMP: 02/21/2018 GFR NL CBC: 01/08/2018 within normal limits Lipids: 02/11/2018 good Tsh: 04/25/2017 WNL Diet: low sodium.  Exercise: routinely.  RF: HTN, HLD, DM, FHx HD  Peripheral neuropathy/anxiety: Patient reports she is doing very well.  She is taking a total of Cymbalta 90 mg daily.    Diabetes has been well controlled and she is actually having some hypoglycemic events in which she is seeing her endocrinologist for. Vit d and b12 levels normal 01/2018.  ROS: See pertinent positives and negatives per HPI.  Depression screen [facility] 2/9 03/14/2019 12/03/2018 08/13/2018 02/11/2018 11/27/2017  Decreased Interest 0 0 0 0 0  Down, Depressed, Hopeless 0 0 0 1 1  PHQ - 2 Score 0 0 0 1 1  Altered sleeping 0 - 0 0 1  Tired, decreased energy 1 - 1 1 1   Change in appetite 0 - 0 0 1  Feeling bad or failure about yourself  0 - 0 0 0  Trouble concentrating 0 - 0  0 0  Moving slowly or fidgety/restless 0 - 0 0 0  Suicidal thoughts 0 - 0 0 0  PHQ-9 Score 1 - 1 2 4   Difficult doing work/chores Not difficult at all - Not difficult at all Not difficult at all Not difficult at all  Some recent data might be hidden   GAD 7 : Generalized Anxiety Score 03/14/2019 08/13/2018 02/11/2018 08/10/2017  Nervous, Anxious, on Edge 0 2 1 1   Control/stop worrying 0 0 1 1  Worry too much - different things 0 3 1 1   Trouble relaxing 0 0 0 1  Restless 0 0 0 1  Easily annoyed or irritable 0 0 1 0  Afraid - awful might happen 0 0 1 0  Total GAD 7 Score 0 5 5 5   Anxiety Difficulty Not difficult at all Not difficult at all Somewhat difficult Not difficult at all    Patient Active Problem List   Diagnosis Date Noted  . Type 2 diabetes mellitus with retinopathy, with long-term current use of insulin ([place]) 05/17/2018  . Overweight (BMI 25.0-29.9) 02/11/2018  . Vitamin D deficiency 10/13/2016  . 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 Iron deficiency anemia 04/28/2016  . Other insomnia 03/14/2016  . DDD (degenerative disc disease), cervical 03/14/2016  . Spondylosis without myelopathy or radiculopathy, lumbar region 01/31/2016  . Medicare annual wellness visit, initial 08/06/2015  . Anxiety and depression 06/25/2014  . Psoriatic arthritis -  sees rheumatologist 02/20/2014  . Asthma 12/14/2008  . Diabetic peripheral neuropathy associated with type 2 diabetes mellitus ([place]) 08/06/2008  . Hyperlipemia 11/12/2006  . Essential hypertension 11/12/2006    Social History   Tobacco Use  . Smoking status: Never Smoker  . Smokeless tobacco: Never Used  Substance Use Topics  . Alcohol use: Yes    Comment: glass of wine-special occasion    Current Outpatient Medications:  .  albuterol (PROVENTIL HFA;VENTOLIN HFA) 108 (90 Base) MCG/ACT inhaler, Inhale 2 puffs into the lungs every 6 (six) hours as needed for wheezing or shortness of  breath., Disp: 1 Inhaler, Rfl: 1 .  aspirin 81 MG tablet, Take 81 mg by mouth daily.  , Disp: , Rfl:  .  Biotin 5000 MCG TABS, Take 5,000 mcg by mouth daily. , Disp: , Rfl:  .  calcium-vitamin D 250-100 MG-UNIT per tablet, Take 1 tablet by mouth daily. , Disp: , Rfl:  .  Continuous Blood Gluc Transmit (DEXCOM G6 TRANSMITTER) MISC, 1 Device by Does not apply route every 3 (three) months., Disp: 1 each, Rfl: 3 .  Cyanocobalamin (VITAMIN B12 PO), Place under the tongue., Disp: , Rfl:  .  DULoxetine (CYMBALTA) 30 MG capsule, TAKE 1 CAPSULE BY MOUTH EVERY DAY, Disp: 90 capsule, Rfl: 1 .  DULoxetine (CYMBALTA) 60 MG capsule, Take 1 capsule (60 mg total) by mouth daily., Disp: 90 capsule, Rfl: 1 .  fluticasone (FLONASE) 50 MCG/ACT nasal spray, Place 2 sprays into both nostrils daily., Disp: 16 g, Rfl: 6 .  gabapentin (NEURONTIN) 100 MG capsule, TAKE 1 CAPSULE BY MOUTH NIGHTLY AT BEDTIME, Disp: 30 capsule, Rfl: 1 .  glucagon (GLUCAGON EMERGENCY) 1 MG injection, Inject 1 mg into the muscle once as needed., Disp: 1 each, Rfl: 12 .  glucose blood (ONETOUCH VERIO) test strip, USE TO TEST BLOOD SUGAR TWICE A DAY DX: E11.319, Disp: 200 each, Rfl: 0 .  Insulin Pen Needle (NOVOFINE) 32G X 6 MM MISC, USE TO INJECT NOVOLOG THREE TIMES DAILY, Disp: 300 each, Rfl: 1 .  Insulin Syringe-Needle U-100 (B-D INS SYR MICROFINE 1CC/27G) 27G X 5/8" 1 ML MISC, Use to inject levemir once daily DX: E11.319, Disp: 120 each, Rfl: 6 .  LEVEMIR 100 UNIT/ML injection, INJECT 0.3 ML (30 UNITS) INTO THE SKIN AT BEDTIME, Disp: 30 mL, Rfl: 3 .  lisinopril (ZESTRIL) 2.5 MG tablet, Take 1 tablet (2.5 mg total) by mouth daily., Disp: 90 tablet, Rfl: 1 .  metFORMIN (GLUCOPHAGE) 1000 MG tablet, TAKE 1 TABLET BY MOUTH 2 TIMES DAILY WITH MEALS, Disp: 60 tablet, Rfl: 2 .  methocarbamol (ROBAXIN) 500 MG tablet, Take 1 tablet (500 mg total) by mouth daily as needed for muscle spasms., Disp: 30 tablet, Rfl: 0 .  metoprolol tartrate (LOPRESSOR) 25 MG  tablet, Take 1 tablet (25 mg total) by mouth 2 (two) times daily., Disp: 180 tablet, Rfl: 1 .  Multiple Vitamin (MULTIVITAMIN) tablet, Take 1 tablet by mouth daily. , Disp: , Rfl:  .  NOVOLOG FLEXPEN 100 UNIT/ML FlexPen, INJECT 6 TO 8 UNITS UNDER THE SKIN THREE TIMES DAILY WITH MEALS, PLUS SLIDING SCALE, Disp: 15 mL, Rfl: 0 .  ONE TOUCH LANCETS MISC, USE TO TEST BLOOD SUGAR TWICE A DAY DX: E11.319, Disp: 200 each, Rfl: 1 .  pravastatin (PRAVACHOL) 20 MG tablet, TAKE 1 TABLET BY MOUTH EVERY DAY, Disp: 90 tablet, Rfl: 3 .  terbinafine (LAMISIL) 250 MG tablet, Take 1 tablet (250 mg total) by mouth daily., Disp: 90 tablet,  Rfl: 0 .  Vitamin D, Ergocalciferol, (DRISDOL) 1.25 MG (50000 UT) CAPS capsule, TAKE 1 CAPSULE BY MOUTH every 7 DAYS, Disp: 15 capsule, Rfl: 3 .  zolpidem (AMBIEN) 5 MG tablet, TAKE 1 TABLET BY MOUTH NIGHTLY AT BEDTIME AS NEEDED FOR INSOMNIA, Disp: 30 tablet, Rfl: 0  No Known Allergies  OBJECTIVE: BP 124/82   Pulse 80   Ht 5\' 4"  (1.626 m)   Wt 168 lb (76.2 kg)   LMP  (LMP Unknown)   BMI 28.84 kg/m  Gen: Afebrile. No acute distress.  HENT: AT. [place]. Eyes:Pupils Equal Round Reactive to light, Extraocular movements intact,  Conjunctiva without redness, discharge or icterus. Chest: No cough or shortness of breath Neuro:   Alert. Oriented x3  Psych: Normal affect, dress and demeanor. Normal speech. Normal thought content and judgment..     ASSESSMENT AND PLAN: Priscilla [name] is a 68 y.o. female present for  Essential hypertension/palpitations/HLD/overweight - stable- reported. Refills  provided today on metoprolol, lisinopril and pravastatin. -Lipid panel, TSH and T4 collected today. - Continue current regimen. - low sodium. Routine exercise.  - con't baby ASA - monitor for elevated pressures > 140/90 and return to clinic if routinely above goal.  - f/u 6 months.   Vitamin D deficiency: Vitamin D levels collected today.  Anxiety and depression/neuropathy  -Stable.  Continue a total of 90 mg daily of Cymbalta.    Refills provided today -Follow-up 6 months   type 2 diabetes mellitus with retinopathy, with long-term current use of insulin ([place]) Managed by endocrinology, last A1c looked good last visit and they are working together for hypoglycemic events.   Orders Placed This Encounter  Procedures  . Basic Metabolic Panel (BMET)  . TSH  . T4, free  . Lipid panel  . Vitamin D (25 hydroxy)   > 25 minutes spent with patient, >50% of time spent face to face     Howard Pouch, DO 03/18/2019

## 2019-03-15 ENCOUNTER — Other Ambulatory Visit: Payer: Self-pay | Admitting: Physician Assistant

## 2019-03-17 NOTE — Telephone Encounter (Signed)
Last Visit: 10/15/2018  Next Visit: 04/23/2019  Last fill: 02/11/2019   Okay to refill Lorrin Mais?

## 2019-03-18 ENCOUNTER — Encounter: Payer: Self-pay | Admitting: Family Medicine

## 2019-03-19 ENCOUNTER — Ambulatory Visit (INDEPENDENT_AMBULATORY_CARE_PROVIDER_SITE_OTHER): Payer: Medicare Other | Admitting: Family Medicine

## 2019-03-19 ENCOUNTER — Other Ambulatory Visit: Payer: Self-pay

## 2019-03-19 DIAGNOSIS — E785 Hyperlipidemia, unspecified: Secondary | ICD-10-CM

## 2019-03-19 DIAGNOSIS — E559 Vitamin D deficiency, unspecified: Secondary | ICD-10-CM | POA: Diagnosis not present

## 2019-03-19 DIAGNOSIS — I10 Essential (primary) hypertension: Secondary | ICD-10-CM | POA: Diagnosis not present

## 2019-03-19 LAB — T4, FREE: Free T4: 1.44 ng/dL (ref 0.60–1.60)

## 2019-03-19 LAB — LIPID PANEL
Cholesterol: 185 mg/dL (ref 0–200)
HDL: 58.3 mg/dL (ref >39.00)
LDL Cholesterol: 111 mg/dL — ABNORMAL HIGH (ref 0–99)
NonHDL: 126.62
Total CHOL/HDL Ratio: 3
Triglycerides: 80 mg/dL (ref 0.0–149.0)
VLDL: 16 mg/dL (ref 0.0–40.0)

## 2019-03-19 LAB — BASIC METABOLIC PANEL
BUN: 15 mg/dL (ref 6–23)
CO2: 32 mEq/L (ref 19–32)
Calcium: 9.6 mg/dL (ref 8.4–10.5)
Chloride: 102 mEq/L (ref 96–112)
Creatinine, Ser: 0.77 mg/dL (ref 0.40–1.20)
GFR: 74.2 mL/min (ref >60.00)
Glucose, Bld: 124 mg/dL — ABNORMAL HIGH (ref 70–99)
Potassium: 4.7 mEq/L (ref 3.5–5.1)
Sodium: 139 mEq/L (ref 135–145)

## 2019-03-19 LAB — TSH: TSH: 1.97 u[IU]/mL (ref 0.35–4.50)

## 2019-03-19 LAB — VITAMIN D 25 HYDROXY (VIT D DEFICIENCY, FRACTURES): VITD: 73.56 ng/mL (ref 30.00–100.00)

## 2019-03-24 ENCOUNTER — Other Ambulatory Visit: Payer: Self-pay

## 2019-03-24 MED ORDER — NOVOLOG FLEXPEN 100 UNIT/ML ~~LOC~~ SOPN: PEN_INJECTOR | SUBCUTANEOUS | 2 refills | Status: DC

## 2019-03-26 ENCOUNTER — Other Ambulatory Visit: Payer: Self-pay | Admitting: Family Medicine

## 2019-03-26 DIAGNOSIS — F329 Major depressive disorder, single episode, unspecified: Secondary | ICD-10-CM

## 2019-03-26 DIAGNOSIS — F419 Anxiety disorder, unspecified: Secondary | ICD-10-CM

## 2019-03-31 ENCOUNTER — Other Ambulatory Visit: Payer: Self-pay | Admitting: Family Medicine

## 2019-03-31 DIAGNOSIS — F329 Major depressive disorder, single episode, unspecified: Secondary | ICD-10-CM

## 2019-03-31 DIAGNOSIS — F32A Depression, unspecified: Secondary | ICD-10-CM

## 2019-04-15 ENCOUNTER — Other Ambulatory Visit: Payer: Self-pay | Admitting: Physician Assistant

## 2019-04-15 NOTE — Telephone Encounter (Signed)
Last Visit: 10/15/2018  Next Visit: 04/23/2019  Last fill: 03/17/19  Okay to refill Lorrin Mais?

## 2019-04-18 ENCOUNTER — Encounter: Payer: Self-pay | Admitting: Internal Medicine

## 2019-04-21 NOTE — Progress Notes (Signed)
Virtual Visit via Telephone Note  I connected with Priscilla [name] on 04/23/19 at  3:30 PM EST by telephone and verified that I am speaking with the correct person using two identifiers.  Location: Patient: Home  Provider: Clinic  This service was conducted via virtual visit.  The patient was located at home. I was located in my office.  Consent was obtained prior to the virtual visit and is aware of possible charges through their insurance for this visit.  The patient is an established patient.  Dr. Estanislado Pandy, MD conducted the virtual visit and Hazel Sams, PA-C acted as scribe during the service.  Office staff helped with scheduling follow up visits after the service was conducted.     I discussed the limitations, risks, security and privacy concerns of performing an evaluation and management service by telephone and the availability of in person appointments. I also discussed with the patient that there may be a patient responsible charge related to this service. The patient expressed understanding and agreed to proceed.  CC: Thoracic pain  History of Present Illness: Patient is a 69 year old female with a past medical history of psoriatic arthritis, DDD, and fibromyalgia.  She denies any recent psoriatic arthritis flares.  She experiences pain and stiffness in both hands in the morning, which resolved with hand exercises.   She denies any joint swelling. She denies any achilles tendonitis or plantar fasciitis.  She denies any active psoriasis at this time.  She has chronic neck pain and stiffness. She denies any symptoms of radiculopathy.  She states she had a bout of costochondritis several months ago, and she continues to have pain in the right scapular region and mid thoracic region.  She takes tylenol as needed for pain relief.  She has tried taking robaxin and zanaflex in the past, which were not effective.  She takes cymbalta 30 mg in the morning and 60 mg at night.  She has chronic fatigue  related to underlying anemia and insomnia.  She takes ambien 5 mg po at bedtime for insomnia.    Review of Systems  Constitutional: Positive for malaise/fatigue. Negative for fever.  HENT: Negative for ear pain.   Eyes: Negative for photophobia, pain, discharge and redness.  Respiratory: Negative for cough, shortness of breath and wheezing.   Cardiovascular: Negative for chest pain and palpitations.  Gastrointestinal: Negative for blood in stool, constipation and diarrhea.  Genitourinary: Negative for dysuria and urgency.  Musculoskeletal: Positive for back pain, joint pain, myalgias and neck pain.  Skin: Negative for rash.  Neurological: Negative for headaches.  Endo/Heme/Allergies: Does not bruise/bleed easily.  Psychiatric/Behavioral: Negative for depression. The patient has insomnia. The patient is not nervous/anxious.       Observations/Objective: Physical Exam  Constitutional: She is oriented to person, place, and time.  Neurological: She is alert and oriented to person, place, and time.  Psychiatric: Mood, memory, affect and judgment normal.   Patient reports morning stiffness for 1 hour.   Patient reports nocturnal pain.  Difficulty dressing/grooming: Denies Difficulty climbing stairs: Reports Difficulty getting out of chair: Denies Difficulty using hands for taps, buttons, cutlery, and/or writing: Reports    Assessment and Plan: Visit Diagnoses: Psoriatic arthritis ([place]) - In remission. She has not had any recent psoriatic arthritis flares.  She has no joint inflammation at this time.  She experiences discomfort and stiffness in both hands in the morning, which resolves with hand exercises.  She denies any achilles tendonitis or plantar fasciitis. She  denies any SI joint pain.  She has no active psoriasis at this time.  She is not taking any immunosuppressive agents at this time.  She was advised to notify us if she develops increased joint pain or joint swelling.  She will  follow up in 6 months.   Psoriasis - She has no psoriasis at this time.  Primary osteoarthritis of both hands -She experiences pain and stiffness in both hands. She performs hand exercises, which resolve the joint stiffness.  Joint protection and muscle strengthening were discussed.   DDD (degenerative disc disease), cervical - She has chronic neck pain and stiffness.   Fibromyalgia -She has generalized muscle aches and muscle tenderness due to fibromyalgia.  She had a bout of costochondritis several months ago, and she continues to have discomfort in the right scapular and mid thoracic region.  She has been taking tylenol as needed for pain relief.  She has not found Robaxin or zanaflex to be effective, so she is no longer taking either medication.  She has chronic fatigue related to underlying anemia and fatigue.  She was encouraged to stay active and exercise on a regular basis.   Pain in thoracic spine: She had a bout of costochondritis several months ago and continues to have residual discomfort in the mid thoracic region and right scapular region. She was advised to notify us if she would like to return for a thoracic spine x-ray the future. She plans on continuing to take tylenol as needed for pain relief.  Spondylosis without myelopathy or radiculopathy, lumbar region - Chronic pain.   Other insomnia - She takes Ambien 5 mg as needed at bedtime for sleep.   Other fatigue: Chronic.   History of scoliosis   Other medical conditions are listed as follows:   History of diabetes mellitus  History of carpal tunnel syndrome  History of depression   History of anxiety   History of sleep apnea   History of asthma   History of vitamin D deficiency   History of anemia: She had an iron infusion a few weeks ago.   History of hypertension   Gastric bypass status for obesity   Follow Up Instructions: She will follow up in 6 months.   I discussed the assessment  and treatment plan with the patient. The patient was provided an opportunity to ask questions and all were answered. The patient agreed with the plan and demonstrated an understanding of the instructions.   The patient was advised to call back or seek an in-person evaluation if the symptoms worsen or if the condition fails to improve as anticipated.  I provided 25 minutes of non-face-to-face time during this encounter.   Bo Merino, MD   Scribed by-  Hazel Sams, PA-C

## 2019-04-22 ENCOUNTER — Other Ambulatory Visit: Payer: Self-pay

## 2019-04-23 ENCOUNTER — Telehealth (INDEPENDENT_AMBULATORY_CARE_PROVIDER_SITE_OTHER): Payer: Medicare Other | Admitting: Rheumatology

## 2019-04-23 ENCOUNTER — Encounter: Payer: Self-pay | Admitting: Rheumatology

## 2019-04-23 DIAGNOSIS — Z8739 Personal history of other diseases of the musculoskeletal system and connective tissue: Secondary | ICD-10-CM

## 2019-04-23 DIAGNOSIS — Z862 Personal history of diseases of the blood and blood-forming organs and certain disorders involving the immune mechanism: Secondary | ICD-10-CM

## 2019-04-23 DIAGNOSIS — G4709 Other insomnia: Secondary | ICD-10-CM | POA: Diagnosis not present

## 2019-04-23 DIAGNOSIS — M19041 Primary osteoarthritis, right hand: Secondary | ICD-10-CM

## 2019-04-23 DIAGNOSIS — L409 Psoriasis, unspecified: Secondary | ICD-10-CM | POA: Diagnosis not present

## 2019-04-23 DIAGNOSIS — Z8659 Personal history of other mental and behavioral disorders: Secondary | ICD-10-CM | POA: Diagnosis not present

## 2019-04-23 DIAGNOSIS — Z8709 Personal history of other diseases of the respiratory system: Secondary | ICD-10-CM

## 2019-04-23 DIAGNOSIS — L4050 Arthropathic psoriasis, unspecified: Secondary | ICD-10-CM | POA: Diagnosis not present

## 2019-04-23 DIAGNOSIS — M546 Pain in thoracic spine: Secondary | ICD-10-CM

## 2019-04-23 DIAGNOSIS — Z9884 Bariatric surgery status: Secondary | ICD-10-CM

## 2019-04-23 DIAGNOSIS — Z8639 Personal history of other endocrine, nutritional and metabolic disease: Secondary | ICD-10-CM | POA: Diagnosis not present

## 2019-04-23 DIAGNOSIS — R5383 Other fatigue: Secondary | ICD-10-CM | POA: Diagnosis not present

## 2019-04-23 DIAGNOSIS — M797 Fibromyalgia: Secondary | ICD-10-CM

## 2019-04-23 DIAGNOSIS — Z8679 Personal history of other diseases of the circulatory system: Secondary | ICD-10-CM

## 2019-04-23 DIAGNOSIS — Z8669 Personal history of other diseases of the nervous system and sense organs: Secondary | ICD-10-CM

## 2019-04-23 DIAGNOSIS — M19042 Primary osteoarthritis, left hand: Secondary | ICD-10-CM

## 2019-04-23 DIAGNOSIS — M5030 Other cervical disc degeneration, unspecified cervical region: Secondary | ICD-10-CM

## 2019-04-23 DIAGNOSIS — M47816 Spondylosis without myelopathy or radiculopathy, lumbar region: Secondary | ICD-10-CM

## 2019-04-24 ENCOUNTER — Encounter: Payer: Self-pay | Admitting: Internal Medicine

## 2019-04-24 ENCOUNTER — Ambulatory Visit (INDEPENDENT_AMBULATORY_CARE_PROVIDER_SITE_OTHER): Payer: Medicare Other | Admitting: Internal Medicine

## 2019-04-24 VITALS — BP 118/62 | HR 79 | Ht 64.0 in | Wt 165.0 lb

## 2019-04-24 DIAGNOSIS — Z794 Long term (current) use of insulin: Secondary | ICD-10-CM | POA: Diagnosis not present

## 2019-04-24 DIAGNOSIS — E11319 Type 2 diabetes mellitus with unspecified diabetic retinopathy without macular edema: Secondary | ICD-10-CM

## 2019-04-24 LAB — POCT GLYCOSYLATED HEMOGLOBIN (HGB A1C): Hemoglobin A1C: 5.9 % — AB (ref 4.0–5.6)

## 2019-04-24 NOTE — Patient Instructions (Signed)
Please continue: - Metformin 1000 mg 2x a day with meals   - Mealtime Novolog 4-8 units but move this 15 min before dinner  Please decrease: - Levemir to 6 units at bedtime  Please come back for a follow-up appointment in 3 months.

## 2019-04-24 NOTE — Progress Notes (Addendum)
Patient ID: Priscilla [name], female   DOB: 1950-07-28, 69 y.o.   MRN: [id number]  This visit occurred during the SARS-CoV-2 public health emergency.  Safety protocols were in place, including screening questions prior to the visit, additional usage of staff PPE, and extensive cleaning of exam room while observing appropriate contact time as indicated for disinfecting solutions.    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 She previously saw Drs Dwyane Dee (distant past) and [place] (more recently). Last visit with me 2 months ago.  She started the [employer] 2 CGM 5 to 6 weeks ago.  Since last visit, her sugars improved to the point of lows so she had to reduce her insulin doses. She continues to have low blood sugars.  Reviewed HbA1c levels: Lab Results  Component Value Date   HGBA1C 5.9 (A) 04/24/2019   HGBA1C 6.0 (A) 02/13/2019   HGBA1C 6.1 (A) 09/20/2018   HGBA1C 6.4 (A) 05/23/2018   HGBA1C 6.0 (A) 10/17/2017   HGBA1C 6.3 06/13/2017   HGBA1C 5.9 11/24/2016   HGBA1C 6.5 (H) 04/22/2016   HGBA1C 7.4 05/18/2015   HGBA1C 7.3 (A) 12/10/2014   HGBA1C 8.0 (H) 06/05/2014   HGBA1C 7.6 (H) 09/26/2013   HGBA1C 7.3 (H) 02/07/2013   HGBA1C 7.0 (H) 06/28/2012   HGBA1C 7.6 (H) 04/21/2011  03/16/2017: HbA1c calculated from fructosamine: 5.6% 10/18/2015: HbA1c calculated from fructosamine: 5.7% 02/2015: HbA1c 7.4%, HbA1c calculated from fructosamine: 5.6%  She is on: - Metformin 1000 mg 2x a day with meals - Levemir 30 >> 15 units 2x a day >> 15 units >> 10 units at bedtime - Mealtime Novolog: 4-8 with dinner (at the start of the meal or right after she starts)    If sugars before the meal are 60 or lower, please do not take the Novolog dose. If sugars before the meal are 61-80, take only half of the Novolog dose.  She was on Victoza 2.5 mg daily >>  stopped as she could not afford this.  Pt checks her sugars 4 times a day:   Previously: - am:  82-130 >> 91-116,140-151 >> 100-130 >> 78-120 - 2h after b'fast: 120, 185 >> 109-178 >> 120s >> 119-164, 179 - before lunch: 79-119 >> 120-123 >> 95-139 >> n/c >> 103-143 - 2h after lunch:  120-135 >> 83-123 >> 130s >> 145-170 - before dinner: 71, 112,148-247 >> 100-130 >> 83-110, 153 - 2h after dinner: 140-170, 229 >> 170s, occas.220 >> 61-224 - bedtime: 106-160 >> 108-156, 200 >> 58  >> 57-175 - nighttime: 46-70 >> 50 (?) >> 50s x1 (? Cause) >> 42-58, even 4 times a week! Lowest sugar was 46 >> 50-at night >> 50s >> 42; she has hypoglycemia awareness in the in the 70s.  She has a glucagon pen at home.. Highest sugar was 285 x1 (cake) >> 247 >> 224.  Glucometer: Freestyle  -No CKD; last BUN/creatinine:  Lab Results  Component Value Date   BUN 15 03/19/2019   CREATININE 0.77 03/19/2019  ACR (12/2014): 6.9 On lisinopril 1.25 mg daily -+ HL; last set of lipids: Lab Results  Component Value Date   CHOL 185 03/19/2019   HDL 58.30 03/19/2019   LDLCALC 111 (H) 03/19/2019   TRIG 80.0 03/19/2019   CHOLHDL 3 03/19/2019  On pravastatin. 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  She has floaters. -+ Improved numbness and tingling in her feet, and also improve burning in her feet.  She takes a B12 supplement.  I recommended alpha lipoic acid but she did not take this due to the large size of the pill. She sees the podiatrist.  On Neurontin 100 mg daily.  Latest TSH was normal: Lab Results  Component Value Date   TSH 1.97 03/19/2019   She also has a history of lap band surgery >> she does not usually eat large meals but she usually grazes, especially at night.  ROS: Constitutional: no weight gain/+ mild weight loss, no fatigue, no subjective hyperthermia, no subjective hypothermia Eyes: no blurry  vision, no xerophthalmia ENT: no sore throat, no nodules palpated in neck, no dysphagia, no odynophagia, no hoarseness Cardiovascular: no CP/no SOB/no palpitations/no leg swelling Respiratory: no cough/no SOB/no wheezing Gastrointestinal: no N/no V/no D/no C/no acid reflux Musculoskeletal: no muscle aches/no joint aches Skin: no rashes, no hair loss Neurological: no tremors/+ numbness/+ tingling/no dizziness  I reviewed pt's medications, allergies, PMH, social hx, family hx, and changes were documented in the history of present illness. Otherwise, unchanged from my initial visit note.  Past Medical History:  Diagnosis Date  . Anemia 12/14/2008  . Anxiety and depression 12/14/2008  . Asthma 11/12/2006  . CAD (coronary artery disease) 12/14/2008   hx transluminal coronary angioplasty  . Cataract   . Depression   . Diabetes mellitus with neuropathy ([place]) 12/14/2008  . Fibromyalgia 01/04/2007   sees rheuamtology  . 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 Hyperlipemia 01/21/2010  . Hypertension 12/14/2008  . Leg edema 12/14/2008  . 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 OSA (obstructive sleep apnea) 08/06/2008  . Osteoarthritis 12/14/2008  . Palpitations 11/12/2006   hx sinus tachy  . Psoriatic arthritis [facility])    sees rheumatology  . Rotator cuff injury    Past Surgical History:  Procedure Laterality Date  . ABDOMINAL HYSTERECTOMY    . CHOLECYSTECTOMY    . LAPAROSCOPIC GASTRIC BANDING    . PTCA    . WRIST SURGERY     LEFT   Social History   Social History  . Marital Status: Married    Spouse Name: N/A  . Number of Children: N/A   Occupational History  . Not on file.   Social History Main Topics  . Smoking status: Never Smoker   . Smokeless tobacco: Never Used  . Alcohol Use: Yes     Comment: glass of wine-specially occasion  . Drug Use: No   Current Outpatient Medications on File Prior to Visit  Medication Sig Dispense Refill  . albuterol (PROVENTIL HFA;VENTOLIN HFA) 108 (90  Base) MCG/ACT inhaler Inhale 2 puffs into the lungs every 6 (six) hours as needed for wheezing or shortness of breath. 1 Inhaler 1  . aspirin 81 MG tablet Take 81 mg by mouth daily.      . Biotin 5000 MCG TABS Take 5,000 mcg by mouth daily.     . calcium-vitamin D 250-100 MG-UNIT per tablet Take 1 tablet by mouth daily.     . Continuous Blood Gluc Transmit (DEXCOM G6 TRANSMITTER) MISC 1 Device by Does not apply route every 3 (three) months. 1 each 3  . Cyanocobalamin (VITAMIN B12 PO) Place under the tongue.    . DULoxetine (CYMBALTA) 30 MG capsule TAKE 1 CAPSULE BY MOUTH EVERY DAY 90 capsule 1  . DULoxetine (CYMBALTA) 60  MG capsule Take 1 capsule (60 mg total) by mouth daily. 90 capsule 1  . fluticasone (FLONASE) 50 MCG/ACT nasal spray Place 2 sprays into both nostrils daily. (Patient taking differently: Place 2 sprays into both nostrils as needed. ) 16 g 6  . gabapentin (NEURONTIN) 100 MG capsule TAKE 1 CAPSULE BY MOUTH NIGHTLY AT BEDTIME (Patient taking differently: at bedtime as needed. ) 30 capsule 1  . glucagon (GLUCAGON EMERGENCY) 1 MG injection Inject 1 mg into the muscle once as needed. 1 each 12  . glucose blood (ONETOUCH VERIO) test strip USE TO TEST BLOOD SUGAR TWICE A DAY DX: E11.319 200 each 0  . insulin aspart (NOVOLOG FLEXPEN) 100 UNIT/ML FlexPen INJECT 6 TO 8 UNITS UNDER THE SKIN THREE TIMES DAILY WITH MEALS, PLUS SLIDING SCALE 15 mL 2  . Insulin Pen Needle (NOVOFINE) 32G X 6 MM MISC USE TO INJECT NOVOLOG THREE TIMES DAILY 300 each 1  . Insulin Syringe-Needle U-100 (B-D INS SYR MICROFINE 1CC/27G) 27G X 5/8" 1 ML MISC Use to inject levemir once daily DX: E11.319 120 each 6  . LEVEMIR 100 UNIT/ML injection INJECT 0.3 ML (30 UNITS) INTO THE SKIN AT BEDTIME 30 mL 3  . lisinopril (ZESTRIL) 2.5 MG tablet Take 1 tablet (2.5 mg total) by mouth daily. 90 tablet 1  . metFORMIN (GLUCOPHAGE) 1000 MG tablet TAKE 1 TABLET BY MOUTH 2 TIMES DAILY WITH MEALS 60 tablet 2  . methocarbamol (ROBAXIN)  500 MG tablet Take 1 tablet (500 mg total) by mouth daily as needed for muscle spasms. (Patient not taking: Reported on 04/23/2019) 30 tablet 0  . metoprolol tartrate (LOPRESSOR) 25 MG tablet Take 1 tablet (25 mg total) by mouth 2 (two) times daily. 180 tablet 1  . Multiple Vitamin (MULTIVITAMIN) tablet Take 1 tablet by mouth daily.     . ONE TOUCH LANCETS MISC USE TO TEST BLOOD SUGAR TWICE A DAY DX: E11.319 200 each 1  . pravastatin (PRAVACHOL) 20 MG tablet TAKE 1 TABLET BY MOUTH EVERY DAY 90 tablet 3  . terbinafine (LAMISIL) 250 MG tablet Take 1 tablet (250 mg total) by mouth daily. 90 tablet 0  . Vitamin D, Ergocalciferol, (DRISDOL) 1.25 MG (50000 UT) CAPS capsule TAKE 1 CAPSULE BY MOUTH every 7 DAYS 15 capsule 3  . zolpidem (AMBIEN) 5 MG tablet TAKE 1 TABLET BY MOUTH NIGHTLY AT BEDTIME AS NEEDED FOR INSOMNIA 30 tablet 0   No current facility-administered medications on file prior to visit.   No Known Allergies Family History  Problem Relation Age of Onset  . Arthritis Mother   . Alzheimer's disease Mother   . Stroke Father   . Alcohol abuse Father   . Diabetes Father   . Heart disease Father   . Early death Father 41  . Diabetes Sister   . Arthritis Sister   . COPD Sister   . Arthritis Maternal Aunt   . Diabetes Maternal Aunt   . Early death Maternal Aunt 37  . Diabetes Maternal Uncle   . Cancer Paternal Aunt   . COPD Paternal 21   . Heart disease Paternal Aunt   . Heart disease Paternal Uncle   . Alcohol abuse Paternal Grandfather   . Heart disease Paternal Grandfather   . Early death Paternal Grandfather 34  . Stroke Paternal Grandfather   . Hypertension Son   . Autoimmune disease Daughter   . Breast cancer Neg Hx    PE: BP 118/62   Pulse 79   Ht  5\' 4"  (1.626 m)   Wt 165 lb (74.8 kg)   LMP  (LMP Unknown)   SpO2 97%   BMI 28.32 kg/m  Body mass index is 28.32 kg/m. Wt Readings from Last 3 Encounters:  04/24/19 165 lb (74.8 kg)  03/14/19 168 lb (76.2 kg)    02/13/19 169 lb (76.7 kg)   Constitutional: overweight, in NAD Eyes: PERRLA, EOMI, no exophthalmos ENT: moist mucous membranes, no thyromegaly, no cervical lymphadenopathy Cardiovascular: RRR, No MRG Respiratory: CTA B Gastrointestinal: abdomen soft, NT, ND, BS+ Musculoskeletal: no deformities, strength intact in all 4 Skin: moist, warm, no rashes Neurological: no tremor with outstretched hands, DTR normal in all 4  ASSESSMENT: 1. DM2, insulin-dependent, uncontrolled, with complications - Diabetic retinopathy left eye - resolved - Peripheral neuropathy  2. HL  3. PN   PLAN:  1. Patient with longstanding, uncontrolled, type 2 diabetes, on Metformin and basal-bolus insulin regimen, with good control, improved in the last 2 years. After her gastric bypass surgery, she is grazing and not having large meals, so we are less strict with her NovoLog doses in the evening. However, since last visit, she developed low blood sugars and we had to reduce the dose of both her Levemir and NovoLog. Despite the reductions, she is still dropping her sugars especially at night. -At this visit, we reviewed together her CGM downloads (we will scan these). It appears that her sugars are slightly higher after dinner, but still well into the normal range but they do drop afterwards. Upon questioning, she is taking her dinnertime NovoLog (she does not take NovoLog with the rest of the meals) either at the start of the meal or after she starts eating, which I think contributes to her blood sugar dropping postprandially. We discussed about moving the boluses 15 minutes before dinner. To avoid further blood sugar drops at night, I advised her to decrease the Levemir further, to only 6 units. I am wondering whether we can eliminate insulin completely and today, we will check her insulin production and antipancreatic antibodies: Orders Placed This Encounter  Procedures  . Anti-islet cell antibody  . ZNT8 Antibodies   . C-peptide  . Glucose, fasting  . Glutamic acid decarboxylase auto abs  . POCT HgB A1C  - I advised her to:  Patient Instructions  Please continue: - Metformin 1000 mg 2x a day with meals   - Mealtime Novolog 4-8 units but move this 15 min before dinner  Please decrease: - Levemir to 6 units at bedtime  Please come back for a follow-up appointment in 3 months.  - we checked her HbA1c: 5.9% (excellent) - advised to check sugars at different times of the day - x a day, rotating check times - advised for yearly eye exams >> she is UTD - return to clinic in 3 months   2. HL -R reviewed latest lipid panel from last month: LDL above target, the rest of the fractions at goal Lab Results  Component Value Date   CHOL 185 03/19/2019   HDL 58.30 03/19/2019   LDLCALC 111 (H) 03/19/2019   TRIG 80.0 03/19/2019   CHOLHDL 3 03/19/2019  -Continues the statin without side effects  3. PN -Secondary to diabetes -Continues B12 and Cymbalta and she was also started on Neurontin by podiatry.  She feels that this is helping -I suggested alpha-lipoic acid in the past but she could not take it due to the large size of the pill  Component  Latest Ref Rng & Units 04/24/2019  Hemoglobin A1C     4.0 - 5.6 % 5.9 (A)  Islet Cell Ab     Neg:<1:1 Negative  ZNT8 Antibodies     U/mL <15  C-Peptide     0.80 - 3.85 ng/mL 2.35  Glucose, Plasma     65 - 99 mg/dL 130 (H)  Glutamic Acid Decarb Ab     <5 IU/mL <5  No insulin deficiency or pancreatic autoimmunity.  Philemon Kingdom, MD PhD [facility]

## 2019-04-26 LAB — GLUCOSE, FASTING: Glucose, Plasma: 130 mg/dL — ABNORMAL HIGH (ref 65–99)

## 2019-04-26 LAB — GLUTAMIC ACID DECARBOXYLASE AUTO ABS: Glutamic Acid Decarb Ab: <5 IU/mL (ref <5)

## 2019-04-26 LAB — C-PEPTIDE: C-Peptide: 2.35 ng/mL (ref 0.80–3.85)

## 2019-04-29 ENCOUNTER — Encounter: Payer: Self-pay | Admitting: Rheumatology

## 2019-04-30 ENCOUNTER — Other Ambulatory Visit: Payer: Self-pay

## 2019-04-30 MED ORDER — METFORMIN HCL 1000 MG PO TABS: ORAL_TABLET | ORAL | 1 refills | Status: DC

## 2019-05-01 ENCOUNTER — Ambulatory Visit
Admission: RE | Admit: 2019-05-01 | Discharge: 2019-05-01 | Disposition: A | Discharge status: Home / Self Care | Payer: Medicare Other | Source: Ambulatory Visit | Attending: Family Medicine | Admitting: Family Medicine

## 2019-05-01 ENCOUNTER — Other Ambulatory Visit: Payer: Self-pay

## 2019-05-01 DIAGNOSIS — Z1239 Encounter for other screening for malignant neoplasm of breast: Secondary | ICD-10-CM

## 2019-05-01 DIAGNOSIS — Z1231 Encounter for screening mammogram for malignant neoplasm of breast: Secondary | ICD-10-CM | POA: Diagnosis not present

## 2019-05-01 LAB — ZNT8 ANTIBODIES: ZNT8 Antibodies: <15 U/mL

## 2019-05-01 LAB — ANTI-ISLET CELL ANTIBODY: Islet Cell Ab: NEGATIVE

## 2019-05-05 ENCOUNTER — Telehealth: Payer: Self-pay | Admitting: Rheumatology

## 2019-05-05 NOTE — Telephone Encounter (Deleted)
I LMOM for patient to call, and schedule a follow up appointment for July 2021. Per Dr. Estanislado Pandy, patient does not need a follow up appointment until then.

## 2019-05-05 NOTE — Telephone Encounter (Signed)
-----   Message from Shona Needles, RT sent at 04/23/2019  2:28 PM EST ----- Regarding: 6 MONTH F/U

## 2019-05-05 NOTE — Telephone Encounter (Signed)
I LMOM for patient to call, and schedule a follow up appointment in 6 months (around 10/31/2019.)

## 2019-05-14 ENCOUNTER — Other Ambulatory Visit: Payer: Self-pay | Admitting: Physician Assistant

## 2019-05-14 NOTE — Telephone Encounter (Signed)
Last Visit: 04/23/2019 telemedicine  Next Visit: 06/04/2019  Last fill: 04/15/2019   Okay to refill Lorrin Mais?

## 2019-05-22 ENCOUNTER — Other Ambulatory Visit: Payer: Self-pay

## 2019-05-22 ENCOUNTER — Inpatient Hospital Stay: Payer: Medicare Other | Attending: Hematology & Oncology

## 2019-05-22 ENCOUNTER — Inpatient Hospital Stay (HOSPITAL_BASED_OUTPATIENT_CLINIC_OR_DEPARTMENT_OTHER): Payer: Medicare Other | Admitting: Hematology & Oncology

## 2019-05-22 ENCOUNTER — Telehealth: Payer: Self-pay | Admitting: Hematology & Oncology

## 2019-05-22 ENCOUNTER — Encounter: Payer: Self-pay | Admitting: Hematology & Oncology

## 2019-05-22 VITALS — BP 140/90 | HR 72 | Temp 96.2°F | Resp 18 | Wt 165.0 lb

## 2019-05-22 DIAGNOSIS — E559 Vitamin D deficiency, unspecified: Secondary | ICD-10-CM

## 2019-05-22 DIAGNOSIS — D508 Other iron deficiency anemias: Secondary | ICD-10-CM | POA: Diagnosis not present

## 2019-05-22 DIAGNOSIS — K909 Intestinal malabsorption, unspecified: Secondary | ICD-10-CM | POA: Diagnosis not present

## 2019-05-22 DIAGNOSIS — Z9884 Bariatric surgery status: Secondary | ICD-10-CM | POA: Diagnosis not present

## 2019-05-22 DIAGNOSIS — Z79899 Other long term (current) drug therapy: Secondary | ICD-10-CM | POA: Diagnosis not present

## 2019-05-22 DIAGNOSIS — D510 Vitamin B12 deficiency anemia due to intrinsic factor deficiency: Secondary | ICD-10-CM

## 2019-05-22 LAB — CBC WITH DIFFERENTIAL (CANCER CENTER ONLY)
Abs Immature Granulocytes: 0.01 10*3/uL (ref 0.00–0.07)
Basophils Absolute: 0.1 10*3/uL (ref 0.0–0.1)
Basophils Relative: 1 %
Eosinophils Absolute: 0.3 10*3/uL (ref 0.0–0.5)
Eosinophils Relative: 5 %
HCT: 43.1 % (ref 36.0–46.0)
Hemoglobin: 13.8 g/dL (ref 12.0–15.0)
Immature Granulocytes: 0 %
Lymphocytes Relative: 26 %
Lymphs Abs: 1.5 10*3/uL (ref 0.7–4.0)
MCH: 29 pg (ref 26.0–34.0)
MCHC: 32 g/dL (ref 30.0–36.0)
MCV: 90.5 fL (ref 80.0–100.0)
Monocytes Absolute: 0.5 10*3/uL (ref 0.1–1.0)
Monocytes Relative: 9 %
Neutro Abs: 3.5 10*3/uL (ref 1.7–7.7)
Neutrophils Relative %: 59 %
Platelet Count: 315 10*3/uL (ref 150–400)
RBC: 4.76 MIL/uL (ref 3.87–5.11)
RDW: 12.4 % (ref 11.5–15.5)
WBC Count: 6 10*3/uL (ref 4.0–10.5)
nRBC: 0 % (ref 0.0–0.2)

## 2019-05-22 LAB — VITAMIN D 25 HYDROXY (VIT D DEFICIENCY, FRACTURES): Vit D, 25-Hydroxy: 80.93 ng/mL (ref 30–100)

## 2019-05-22 LAB — VITAMIN B12: Vitamin B-12: 1321 pg/mL — ABNORMAL HIGH (ref 180–914)

## 2019-05-22 NOTE — Progress Notes (Signed)
Hematology and Oncology Follow Up Visit  Priscilla [name] [id number] 1950-06-25 69 y.o. 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  We last saw her back in June 2020.  Since then, she been doing pretty well.  She is doing a lot of her time is watching her 56-year-old grandson.  Back in June we last saw her, her ferritin was 26 with an iron saturation of 18%.  She did get a dose of IV iron.  Her blood sugars have been fluctuating.  She now has a CGM that she uses.  This hopefully will be able to keep her blood sugars a little bit more stable.  She has had no bleeding.  She has had no change in bowel or bladder habits.  There has been no cough.  She has been very cautious with the coronavirus.  Overall, her performance status is ECOG 1.  Medications:  Allergies as of 05/22/2019   No Known Allergies     Medication List       Accurate as of May 22, 2019  9:03 AM. If you have any questions, ask your nurse or doctor.        STOP taking these medications   NovoFine 32G X 6 MM Misc Generic drug: Insulin Pen Needle Stopped by: Volanda Napoleon, MD   ONE TOUCH LANCETS Misc Stopped by: Volanda Napoleon, MD     TAKE these medications   albuterol 108 (90 Base) MCG/ACT inhaler Commonly known as: VENTOLIN HFA Inhale 2 puffs into the lungs every 6 (six) hours as needed for wheezing or shortness of breath.   aspirin 81 MG tablet Take 81 mg by mouth daily.   Biotin 5000 MCG Tabs Take 5,000 mcg by mouth daily.   calcium-vitamin D 250-100 MG-UNIT tablet Take 1 tablet by mouth daily.   Dexcom G6 Transmitter Misc 1 Device by Does not apply route every 3 (three) months.   DULoxetine 60 MG capsule Commonly known as: CYMBALTA Take 1 capsule (60 mg total) by mouth daily.     DULoxetine 30 MG capsule Commonly known as: CYMBALTA TAKE 1 CAPSULE BY MOUTH EVERY DAY   fluticasone 50 MCG/ACT nasal spray Commonly known as: FLONASE Place 2 sprays into both nostrils daily. What changed:   when to take this  reasons to take this   gabapentin 100 MG capsule Commonly known as: NEURONTIN TAKE 1 CAPSULE BY MOUTH NIGHTLY AT BEDTIME What changed: See the new instructions.   glucagon 1 MG injection Inject 1 mg into the muscle once as needed.   glucose blood test strip Commonly known as: OneTouch Verio USE TO TEST BLOOD SUGAR TWICE A DAY DX: E11.319   Insulin Syringe-Needle U-100 27G X 5/8" 1 ML Misc Commonly known as: B-D INS SYR MICROFINE 1CC/27G Use to inject levemir once daily DX: E11.319   Levemir 100 UNIT/ML injection Generic drug: insulin detemir INJECT 0.3 ML (30 UNITS) INTO THE SKIN AT BEDTIME   lisinopril 2.5 MG tablet Commonly known as: ZESTRIL Take 1 tablet (2.5 mg total) by mouth daily.   metFORMIN 1000 MG tablet Commonly known as: GLUCOPHAGE TAKE 1 TABLET BY MOUTH 2 TIMES DAILY WITH MEALS   methocarbamol 500 MG tablet Commonly known as: ROBAXIN Take 1 tablet (500 mg total) by mouth daily as needed  for muscle spasms.   metoprolol tartrate 25 MG tablet Commonly known as: LOPRESSOR Take 1 tablet (25 mg total) by mouth 2 (two) times daily.   multivitamin tablet Take 1 tablet by mouth daily.   NovoLOG FlexPen 100 UNIT/ML FlexPen Generic drug: insulin aspart INJECT 6 TO 8 UNITS UNDER THE SKIN THREE TIMES DAILY WITH MEALS, PLUS SLIDING SCALE   pravastatin 20 MG tablet Commonly known as: PRAVACHOL TAKE 1 TABLET BY MOUTH EVERY DAY   terbinafine 250 MG tablet Commonly known as: LamISIL Take 1 tablet (250 mg total) by mouth daily.   VITAMIN B12 PO Place under the tongue.   Vitamin D (Ergocalciferol) 1.25 MG (50000 UNIT) Caps capsule Commonly known as: DRISDOL TAKE 1 CAPSULE BY MOUTH every 7 DAYS   zolpidem 5 MG tablet Commonly  known as: AMBIEN TAKE 1 TABLET BY MOUTH NIGHTLY AT BEDTIME AS NEEDED FOR INSOMNIA       Allergies: No Known Allergies  Past Medical History, Surgical history, Social history, and Family History were reviewed and updated.  Review of Systems: Review of Systems  Constitutional: Negative.   HENT: Negative.   Eyes: Negative.   Respiratory: Negative.   Cardiovascular: Negative.   Gastrointestinal: Negative.   Genitourinary: Negative.   Musculoskeletal: Negative.   Skin: Negative.   Neurological: Negative.   Endo/Heme/Allergies: Negative.   Psychiatric/Behavioral: Negative.      Physical Exam:  weight is 165 lb (74.8 kg). Her temporal temperature is 96.2 F (35.7 C) (abnormal). Her blood pressure is 140/90 and her pulse is 72. Her respiration is 18 and oxygen saturation is 97%.   Wt Readings from Last 3 Encounters:  05/22/19 165 lb (74.8 kg)  04/24/19 165 lb (74.8 kg)  03/14/19 168 lb (76.2 kg)    Physical Exam Vitals reviewed.  HENT:     Head: Normocephalic and atraumatic.  Eyes:     Pupils: Pupils are equal, round, and reactive to light.  Cardiovascular:     Rate and Rhythm: Normal rate and regular rhythm.     Heart sounds: Normal heart sounds.  Pulmonary:     Effort: Pulmonary effort is normal.     Breath sounds: Normal breath sounds.  Abdominal:     General: Bowel sounds are normal.     Palpations: Abdomen is soft.  Musculoskeletal:        General: No tenderness or deformity. Normal range of motion.     Cervical back: Normal range of motion.  Lymphadenopathy:     Cervical: No cervical adenopathy.  Skin:    General: Skin is warm and dry.     Findings: No erythema or rash.  Neurological:     Mental Status: She is alert and oriented to person, place, and time.  Psychiatric:        Behavior: Behavior normal.        Thought Content: Thought content normal.        Judgment: Judgment normal.      Lab Results  Component Value Date   WBC 6.0 05/22/2019    HGB 13.8 05/22/2019   HCT 43.1 05/22/2019   MCV 90.5 05/22/2019   PLT 315 05/22/2019   Lab Results  Component Value Date   FERRITIN 26 09/19/2018   IRON 60 09/19/2018   TIBC 335 09/19/2018   UIBC 275 09/19/2018   IRONPCTSAT 18 (L) 09/19/2018   Lab Results  Component Value Date   RBC 4.76 05/22/2019   No results found for: KPAFRELGTCHN, LAMBDASER, KAPLAMBRATIO No results  found for: Kandis Cocking, IGMSERUM No results found for: Odetta Pink, SPEI   Chemistry      Component Value Date/Time   NA 139 03/19/2019 0936   NA 141 10/13/2016 1503   K 4.7 03/19/2019 0936   K 4.9 10/13/2016 1503   CL 102 03/19/2019 0936   CL 103 10/13/2016 1503   CO2 32 03/19/2019 0936   CO2 28 10/13/2016 1503   BUN 15 03/19/2019 0936   BUN 16 10/13/2016 1503   CREATININE 0.77 03/19/2019 0936   CREATININE 0.90 10/13/2016 1503      Component Value Date/Time   CALCIUM 9.6 03/19/2019 0936   CALCIUM 9.5 10/13/2016 1503   ALKPHOS 53 02/11/2018 0830   ALKPHOS 72 10/13/2016 1503   AST 15 01/24/2019 1558   AST 14 10/13/2016 1503   ALT 17 01/24/2019 1558   ALT 20 10/13/2016 1503   BILITOT 0.5 01/24/2019 1558   BILITOT 0.2 10/13/2016 1503       Impression and Plan: Ms. Dabdoub is a very pleasant 69yo caucasian female with iron deficiency anemia secondary to malabsorption after lap band surgery.   I would had believe that her iron studies should be okay right now.  Her MCV is all right.  We will plan to get her back to see [place] in another 6 months.  I think this is reasonable.  If her iron studies are stable when we see her back, we may think about going once a year to see her.   Volanda Napoleon, MD 2/11/20219:03 AM

## 2019-05-22 NOTE — Telephone Encounter (Signed)
Appointments scheduled calendar printed per 2/11 los 

## 2019-05-23 LAB — IRON AND TIBC
Iron: 83 ug/dL (ref 41–142)
Saturation Ratios: 30 % (ref 21–57)
TIBC: 276 ug/dL (ref 236–444)
UIBC: 193 ug/dL (ref 120–384)

## 2019-05-23 LAB — FERRITIN: Ferritin: 96 ng/mL (ref 11–307)

## 2019-05-24 DIAGNOSIS — Z23 Encounter for immunization: Secondary | ICD-10-CM | POA: Diagnosis not present

## 2019-05-29 ENCOUNTER — Ambulatory Visit: Payer: Medicare Other | Admitting: Podiatry

## 2019-05-30 NOTE — Progress Notes (Signed)
Office Visit Note  Patient: Priscilla [name]             Date of Birth: [birth year]           MRN: [id number]             PCP: Ma Hillock, DO Referring: Ma Hillock, DO Visit Date: 06/04/2019 Occupation: @GUAROCC @  Subjective:  Scalp psoriasis  History of Present Illness: Priscilla [name] is a 69 y.o. female with history of psoriatic arthritis, osteoarthritis, fibromyalgia and DDD.  She is not taking any immunosuppressive agents at this time.  She states that 1 week ago she started noticing patches of psoriasis on her scalp.  She states that she has not had a flareup of psoriasis in over a decade.  She tried using over-the-counter shampoo 1 time but did not notice any improvement.  Her scalp has been very itchy.  She denies any other patches of psoriasis on her body.  She continues to have intermittent arthralgias but denies any joint swelling.  She has intermittent lower back pain and sees Dr. Ernestina Patches for injections on occasion.  She has been experiencing increased neck pain and stiffness.  She denies any symptoms of radiculopathy.  She continues have generalized muscle aches and muscle tenderness due to fibromyalgia.  She is taking Cymbalta 90 mg daily.  She takes Robaxin 500 mg 1 tablet daily as needed for muscle spasms.  She continues take Ambien 5 mg 1 tablet by mouth at bedtime for insomnia.  She has been sleeping well at night.  Activities of Daily Living:  Patient reports morning stiffness for 1 hour.   Patient Reports nocturnal pain.  Difficulty dressing/grooming: Denies Difficulty climbing stairs: Reports Difficulty getting out of chair: Denies Difficulty using hands for taps, buttons, cutlery, and/or writing: Reports  Review of Systems  Constitutional: Positive for fatigue.  HENT: Positive for nose dryness. Negative for mouth sores and mouth dryness.   Eyes: Positive for dryness. Negative for pain and visual disturbance.  Respiratory: Negative for cough, hemoptysis,  shortness of breath and difficulty breathing.   Cardiovascular: Negative for chest pain, palpitations, hypertension and swelling in legs/feet.  Gastrointestinal: Negative for blood in stool, constipation and diarrhea.  Endocrine: Negative for increased urination.  Genitourinary: Negative for difficulty urinating and painful urination.  Musculoskeletal: Positive for arthralgias, joint pain and morning stiffness. Negative for joint swelling, myalgias, muscle weakness, muscle tenderness and myalgias.  Skin: Positive for rash. Negative for color change, pallor, hair loss, nodules/bumps, skin tightness, ulcers and sensitivity to sunlight.       scalp  Allergic/Immunologic: Negative for susceptible to infections.  Neurological: Positive for tremors. Negative for numbness, headaches, memory loss and weakness.  Hematological: Negative for bruising/bleeding tendency and swollen glands.  Psychiatric/Behavioral: Negative for depressed mood, confusion and sleep disturbance. The patient is not nervous/anxious.     PMFS History:  Patient Active Problem List   Diagnosis Date Noted  . Type 2 diabetes mellitus with retinopathy, with long-term current use of insulin ([place]) 05/17/2018  . Overweight (BMI 25.0-29.9) 02/11/2018  . Vitamin D deficiency 10/13/2016  . 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 Iron deficiency anemia 04/28/2016  . Other insomnia 03/14/2016  . DDD (degenerative disc disease), cervical 03/14/2016  . Spondylosis without myelopathy or radiculopathy, lumbar region 01/31/2016  . Medicare annual wellness visit, initial 08/06/2015  . Anxiety and depression 06/25/2014  . Psoriatic arthritis - sees rheumatologist 02/20/2014  . Asthma  12/14/2008  . Diabetic peripheral neuropathy associated with type 2 diabetes mellitus ([place]) 08/06/2008  . Hyperlipemia 11/12/2006  . Essential hypertension 11/12/2006    Past Medical History:  Diagnosis Date  .  Anemia 12/14/2008  . Anxiety and depression 12/14/2008  . Asthma 11/12/2006  . CAD (coronary artery disease) 12/14/2008   hx transluminal coronary angioplasty  . Cataract   . Depression   . Diabetes mellitus with neuropathy ([place]) 12/14/2008  . Fibromyalgia 01/04/2007   sees rheuamtology  . 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 Hyperlipemia 01/21/2010  . Hypertension 12/14/2008  . Leg edema 12/14/2008  . 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 OSA (obstructive sleep apnea) 08/06/2008  . Osteoarthritis 12/14/2008  . Palpitations 11/12/2006   hx sinus tachy  . Psoriatic arthritis [facility])    sees rheumatology  . Rotator cuff injury     Family History  Problem Relation Age of Onset  . Arthritis Mother   . Alzheimer's disease Mother   . Stroke Father   . Alcohol abuse Father   . Diabetes Father   . Heart disease Father   . Early death Father 50  . Diabetes Sister   . Arthritis Sister   . COPD Sister   . Arthritis Maternal Aunt   . Diabetes Maternal Aunt   . Early death Maternal Aunt 76  . Diabetes Maternal Uncle   . Cancer Paternal Aunt   . COPD Paternal 24   . Heart disease Paternal Aunt   . Heart disease Paternal Uncle   . Alcohol abuse Paternal Grandfather   . Heart disease Paternal Grandfather   . Early death Paternal Grandfather 72  . Stroke Paternal Grandfather   . Hypertension Son   . Autoimmune disease Daughter   . Breast cancer Neg Hx    Past Surgical History:  Procedure Laterality Date  . ABDOMINAL HYSTERECTOMY    . CHOLECYSTECTOMY    . LAPAROSCOPIC GASTRIC BANDING    . PTCA    . WRIST SURGERY     LEFT   Social History   Social History Narrative   Married, [name]. 2 children name Abe People and Anderson Malta.   Some college, retired Engineer, manufacturing.   Drinks caffeine.   Take a daily vitamin.   Wears her seatbelt, exercises 3 times a week.   Smoke detector in the home.   Firearms in the home.   Feels safe in her relationships.   Immunization History    Administered Date(s) Administered  . Fluad Quad(high Dose 65+) 12/03/2018  . Influenza Split 01/21/2013  . Influenza, High Dose Seasonal PF 02/04/2015, 02/09/2016, 02/11/2018  . Influenza,inj,Quad PF,6+ Mos 01/15/2014  . Influenza-Unspecified 01/26/2017  . Pneumococcal Conjugate-13 02/04/2015  . Pneumococcal Polysaccharide-23 10/31/2013  . Tdap 02/07/2013     Objective: Vital Signs: BP 115/72 (BP Location: Right Arm, Patient Position: Sitting, Cuff Size: Normal)   Pulse 71   Resp 14   Ht 5\' 4"  (1.626 m)   Wt 166 lb (75.3 kg)   LMP  (LMP Unknown)   BMI 28.49 kg/m    Physical Exam Vitals and nursing note reviewed.  Constitutional:      Appearance: She is well-developed.  HENT:     Head: Normocephalic and atraumatic.  Eyes:     Conjunctiva/sclera: Conjunctivae normal.  Pulmonary:     Effort: Pulmonary effort is normal.  Abdominal:     General: Bowel sounds are normal.     Palpations: Abdomen is soft.  Musculoskeletal:  Cervical back: Normal range of motion.  Lymphadenopathy:     Cervical: No cervical adenopathy.  Skin:    General: Skin is warm and dry.     Capillary Refill: Capillary refill takes less than 2 seconds.  Neurological:     Mental Status: She is alert and oriented to person, place, and time.  Psychiatric:        Behavior: Behavior normal.      Musculoskeletal Exam: C-spine very limited range of motion.  Thoracic kyphosis noted.  No midline spinal tenderness.  No SI joint tenderness.  Shoulder joints have good range of motion with no discomfort.  Elbow joints, wrist joints, MCPs, PIPs and DIPs good range of motion with no synovitis.  She has PIP and DIP synovial thickening consistent with osteoarthritis of both hands.  She has tenderness of the right first DIP joint.  Hip joints have good range of motion with no discomfort.  Knee joints have good range of motion no warmth or effusion.  Ankle joints have good range of motion no tenderness or inflammation.   No Achilles tendinitis or plantar fasciitis.  CDAI Exam: CDAI Score: -- Patient Global: --; Provider Global: -- Swollen: --; Tender: -- Joint Exam 06/04/2019   No joint exam has been documented for this visit   There is currently no information documented on the homunculus. Go to the Rheumatology activity and complete the homunculus joint exam.  Investigation: No additional findings.  Imaging: XR Cervical Spine 2 or 3 views  Result Date: 06/04/2019 Multilevel spondylosis was noted.  C6-C7 significant narrowing and anterior spurring was noted.  C4-5 and C5-C6 mild spondylolisthesis was noted.  Facet joint arthropathy was noted. Impression: These findings are consistent with spondylosis and facet joint arthropathy.   Recent Labs: Lab Results  Component Value Date   WBC 6.0 05/22/2019   HGB 13.8 05/22/2019   PLT 315 05/22/2019   NA 139 03/19/2019   K 4.7 03/19/2019   CL 102 03/19/2019   CO2 32 03/19/2019   GLUCOSE 124 (H) 03/19/2019   BUN 15 03/19/2019   CREATININE 0.77 03/19/2019   BILITOT 0.5 01/24/2019   ALKPHOS 53 02/11/2018   AST 15 01/24/2019   ALT 17 01/24/2019   PROT 6.5 01/24/2019   ALBUMIN 4.2 02/11/2018   CALCIUM 9.6 03/19/2019   GFRAA 77 10/13/2016    Speciality Comments: No specialty comments available.  Procedures:  No procedures performed Allergies: Patient has no known allergies.   Assessment / Plan:     Visit Diagnoses: Psoriatic arthritis ([place]) - In remission: She has no synovitis or dactylitis on exam.  She has not had any recent psoriatic arthritis flares.  She is not taking any immunosuppressive agents since she has been in remission.  She is not having any plantar fasciitis, Achilles tendinitis, or SI joint pain.  1 week ago she noticed patches of scalp psoriasis returning.  She has not had a psoriasis flare in over a decade.  A prescription for clobetasol shampoo will be sent to the pharmacy.  She was advised to notify us if she notices new  patches of psoriasis developing.  She will also notify us if she develops increased joint pain or joint swelling.  She will follow-up in the office in 66months.  Psoriasis: She has scattered patches of psoriasis on her scalp today.  She started having a flare 1 week ago.  She denies any change in hair products recently.  She tried using an over-the-counter shampoo 1 time but did  not notice any improvement.  A prescription for clobetasol scalp solution was sent to the pharmacy today. She has no other patches of psoriasis on exam.  No nail pitting or nail dystrophy was noted.  Primary osteoarthritis of both hands: She has PIP and DIP synovial thickening consistent osteoarthritis of both hands.  She has tenderness of the right first DIP joint.  No synovitis was noted.  She has complete fist formation bilaterally.  Joint protection and muscle strengthening were discussed.  DDD (degenerative disc disease), cervical: She has been experiencing increased neck pain and stiffness.  She has no symptoms of radiculopathy.  She has very limited range of motion exam today.  X-rays of the C-spine were obtained.  Neck pain -She has been experiencing increased neck pain and stiffness.  She has no symptoms of radiculopathy at this time.  She has very limited range of motion with flexion extension as well as lateral rotation bilaterally.  X-rays of the C-spine were obtained today.  She was given a handout of neck exercises to perform.  Plan: XR Cervical Spine 2 or 3 views  Spondylosis without myelopathy or radiculopathy, lumbar region: She experiences intermittent lower back pain.  She is followed by Dr. Ernestina Patches and has had injections in the past.  She is not experiencing any symptoms of radiculopathy at this time.  No midline spinal tenderness.  She has a prescription for Robaxin 500 mg 1 tablet daily as needed for muscle spasms.  She has not found Robaxin to be very effective and has not taken it recently.  Fibromyalgia:  She continues with generalized muscle aches muscle tenderness due to fibromyalgia.  She is clinically doing well on Cymbalta 30 mg 1 capsule in the morning and 60 mg capsule in the evening.  She takes Robaxin 500 mg 1 tablet daily as needed for muscle spasms.  She continues to have chronic fatigue secondary to insomnia.  She is been sleeping well at night taking Ambien 5 mg 1 tablet by mouth at bedtime.  Discussed importance of regular exercise and good sleep hygiene.  Other insomnia - She takes Ambien 5 mg as needed at bedtime for insomnia.   Other fatigue: She continues to have chronic fatigue.  She has been sleeping well at night after taking Ambien 5 mg 1 tablet by mouth at bedtime.  We discussed the importance of regular exercise.  History of scoliosis: She experiences chronic lower back pain.  History of carpal tunnel syndrome: Asymptomatic at this time.  Other medical conditions are listed as follows:  History of diabetes mellitus  History of anemia  History of sleep apnea  History of anxiety  History of depression  History of asthma  History of vitamin D deficiency  Gastric bypass status for obesity    Orders: Orders Placed This Encounter  Procedures  . XR Cervical Spine 2 or 3 views   Meds ordered this encounter  Medications  . clobetasol (TEMOVATE) 0.05 % external solution    Sig: Apply 1 application topically 2 (two) times daily.    Dispense:  50 mL    Refill:  0    Face-to-face time spent with patient was 30 minutes. Greater than 50% of time was spent in counseling and coordination of care.  Follow-Up Instructions: Return in about 3 months (around 09/01/2019) for Psoriatic arthritis, Osteoarthritis, Fibromyalgia.   Priscilla Neas, PA-C   I examined and evaluated the patient with Priscilla Sams PA.  Patient has been having neck stiffness and discomfort in  her cervical spine.  X-ray obtained today showed facet joint arthropathy and C6-C7 narrowing.  She has also  loss of lordosis.  I compared the x-rays to the CT scan findings back in 2018.  There was no progression in the disease process based on the comparison.  She was given a handout on neck exercises.  She also had rash on her scalp which looks like excoriation marks and possible psoriasis.  She was given clobetasol topical solution to use on the rash.  The plan of care was discussed as noted above.  Bo Merino, MD  Note - This record has been created using Editor, commissioning.  Chart creation errors have been sought, but may not always  have been located. Such creation errors do not reflect on  the standard of medical care.

## 2019-06-04 ENCOUNTER — Ambulatory Visit: Payer: Self-pay

## 2019-06-04 ENCOUNTER — Ambulatory Visit (INDEPENDENT_AMBULATORY_CARE_PROVIDER_SITE_OTHER): Payer: Medicare Other | Admitting: Rheumatology

## 2019-06-04 ENCOUNTER — Encounter: Payer: Self-pay | Admitting: Rheumatology

## 2019-06-04 ENCOUNTER — Other Ambulatory Visit: Payer: Self-pay

## 2019-06-04 ENCOUNTER — Telehealth: Payer: Self-pay | Admitting: *Deleted

## 2019-06-04 VITALS — BP 115/72 | HR 71 | Resp 14 | Ht 64.0 in | Wt 166.0 lb

## 2019-06-04 DIAGNOSIS — Z862 Personal history of diseases of the blood and blood-forming organs and certain disorders involving the immune mechanism: Secondary | ICD-10-CM | POA: Diagnosis not present

## 2019-06-04 DIAGNOSIS — L409 Psoriasis, unspecified: Secondary | ICD-10-CM

## 2019-06-04 DIAGNOSIS — Z9884 Bariatric surgery status: Secondary | ICD-10-CM

## 2019-06-04 DIAGNOSIS — M542 Cervicalgia: Secondary | ICD-10-CM | POA: Diagnosis not present

## 2019-06-04 DIAGNOSIS — Z8669 Personal history of other diseases of the nervous system and sense organs: Secondary | ICD-10-CM | POA: Diagnosis not present

## 2019-06-04 DIAGNOSIS — G4709 Other insomnia: Secondary | ICD-10-CM | POA: Diagnosis not present

## 2019-06-04 DIAGNOSIS — Z8639 Personal history of other endocrine, nutritional and metabolic disease: Secondary | ICD-10-CM

## 2019-06-04 DIAGNOSIS — M47816 Spondylosis without myelopathy or radiculopathy, lumbar region: Secondary | ICD-10-CM

## 2019-06-04 DIAGNOSIS — M5030 Other cervical disc degeneration, unspecified cervical region: Secondary | ICD-10-CM

## 2019-06-04 DIAGNOSIS — M797 Fibromyalgia: Secondary | ICD-10-CM

## 2019-06-04 DIAGNOSIS — Z8709 Personal history of other diseases of the respiratory system: Secondary | ICD-10-CM

## 2019-06-04 DIAGNOSIS — R5383 Other fatigue: Secondary | ICD-10-CM

## 2019-06-04 DIAGNOSIS — M19041 Primary osteoarthritis, right hand: Secondary | ICD-10-CM | POA: Diagnosis not present

## 2019-06-04 DIAGNOSIS — Z8739 Personal history of other diseases of the musculoskeletal system and connective tissue: Secondary | ICD-10-CM

## 2019-06-04 DIAGNOSIS — Z8659 Personal history of other mental and behavioral disorders: Secondary | ICD-10-CM

## 2019-06-04 DIAGNOSIS — L4050 Arthropathic psoriasis, unspecified: Secondary | ICD-10-CM

## 2019-06-04 DIAGNOSIS — M19042 Primary osteoarthritis, left hand: Secondary | ICD-10-CM

## 2019-06-04 MED ORDER — CLOBETASOL PROPIONATE 0.05 % EX SOLN: 1.0000 "application " | Freq: Two times a day (BID) | CUTANEOUS | 0 refills | Status: DC

## 2019-06-04 NOTE — Telephone Encounter (Signed)
Submitted a Prior Authorization request to [facility] for Clobetasol Propionate 0.05 Solution via Cover My Meds. Will update once we receive a response.

## 2019-06-04 NOTE — Patient Instructions (Signed)

## 2019-06-05 MED ORDER — CLOBETASOL PROPIONATE 0.05 % EX SHAM: MEDICATED_SHAMPOO | CUTANEOUS | 0 refills | Status: DC

## 2019-06-05 NOTE — Telephone Encounter (Signed)
Received a fax regarding Prior Authorization from [place] for Clobetasol Propionate Solution. Authorization has been DENIED because patient need to try covered alternative. Per Hazel Sams, PA-C send in prescription for Clobetasol Shampoo.

## 2019-06-05 NOTE — Addendum Note (Signed)
Addended by: Carole Binning on: 06/05/2019 09:08 AM   Modules accepted: Orders

## 2019-06-09 ENCOUNTER — Telehealth: Payer: Self-pay | Admitting: Rheumatology

## 2019-06-09 NOTE — Telephone Encounter (Signed)
Attempted to contact the patient. Unable to leave a message, voicemail not set up.

## 2019-06-09 NOTE — Telephone Encounter (Signed)
Patient called stating the pharmacy needs additional information from Dr. Estanislado Pandy before they are able to fill her prescription for medicated shampoo.  Patient is requesting a return call.

## 2019-06-10 NOTE — Telephone Encounter (Signed)
Patient advised the clobetasol solution was denied. patient advised that the clobetasol shampoo has been sent to the pharmacy and we have not been asked for a prior authorization for it.

## 2019-06-11 ENCOUNTER — Telehealth: Payer: Self-pay | Admitting: Rheumatology

## 2019-06-11 NOTE — Telephone Encounter (Signed)
Margarita Grizzle from [place] left a voicemail regarding patient's two prescriptions of Clobetasol shampoo and solution and checking on the status of prior authorizations for each of the prescriptions.  They are still not going through and patient asked that we call.   Please call back at [phone]

## 2019-06-11 NOTE — Telephone Encounter (Signed)
Spoke with the Priscilla [name] and advised we have not received a prior authorization on the Clobetasol shampoo. Priscilla [name] the Prior Authorization on the Clobetasol Solution has been denied. Priscilla [name] states she will resend the PA for the shampoo.

## 2019-06-12 ENCOUNTER — Other Ambulatory Visit: Payer: Self-pay | Admitting: Physician Assistant

## 2019-06-12 NOTE — Telephone Encounter (Signed)
Last Visit: 06/04/19 Next Visit: 09/03/19  Okay to refill Ambien?

## 2019-06-16 DIAGNOSIS — L218 Other seborrheic dermatitis: Secondary | ICD-10-CM | POA: Diagnosis not present

## 2019-06-16 DIAGNOSIS — L298 Other pruritus: Secondary | ICD-10-CM | POA: Diagnosis not present

## 2019-06-16 DIAGNOSIS — L570 Actinic keratosis: Secondary | ICD-10-CM | POA: Diagnosis not present

## 2019-06-17 ENCOUNTER — Ambulatory Visit: Payer: Medicare Other | Admitting: Internal Medicine

## 2019-06-21 DIAGNOSIS — Z23 Encounter for immunization: Secondary | ICD-10-CM | POA: Diagnosis not present

## 2019-06-24 ENCOUNTER — Other Ambulatory Visit: Payer: Self-pay

## 2019-06-24 ENCOUNTER — Ambulatory Visit (INDEPENDENT_AMBULATORY_CARE_PROVIDER_SITE_OTHER): Payer: Medicare Other | Admitting: Family Medicine

## 2019-06-24 ENCOUNTER — Encounter: Payer: Self-pay | Admitting: Family Medicine

## 2019-06-24 VITALS — Ht 64.0 in

## 2019-06-24 DIAGNOSIS — R8290 Unspecified abnormal findings in urine: Secondary | ICD-10-CM | POA: Diagnosis not present

## 2019-06-24 DIAGNOSIS — R300 Dysuria: Secondary | ICD-10-CM | POA: Diagnosis not present

## 2019-06-24 LAB — POC URINALSYSI DIPSTICK (AUTOMATED)
Blood, UA: NEGATIVE
Glucose, UA: NEGATIVE
Ketones, UA: NEGATIVE
Leukocytes, UA: NEGATIVE
Nitrite, UA: NEGATIVE
Protein, UA: POSITIVE — AB
Spec Grav, UA: 1.025 (ref 1.010–1.025)
Urobilinogen, UA: 0.2 E.U./dL
pH, UA: 6 (ref 5.0–8.0)

## 2019-06-24 MED ORDER — CEPHALEXIN 500 MG PO CAPS: 500.0000 mg | ORAL_CAPSULE | Freq: Four times a day (QID) | ORAL | 0 refills | Status: DC

## 2019-06-24 NOTE — Progress Notes (Signed)
VIRTUAL VISIT VIA VIDEO  I connected with Priscilla [name] on 06/24/19 at  2:30 PM EDT by elemedicine application and verified that I am speaking with the correct person using two identifiers. Location patient: Home Location provider: [facility], Office Persons participating in the virtual visit: Patient, Dr. Raoul Pitch and R.Baker, LPN  I discussed the limitations of evaluation and management by telemedicine and the availability of in person appointments. The patient expressed understanding and agreed to proceed.   SUBJECTIVE Chief Complaint  Patient presents with  . Urinary Frequency    Pt has had urinary frequency and discomfort with urination since Sunday.     HPI: Priscilla [name] is a 69 y.o. female present for urinary frequency with mild dysuria present for 3 days.  Patient reports a mild chill as well, however she did just have her second Covid vaccine the night prior and it may have been secondary to her vaccination.  She is eating and drinking well denies nausea or vomiting.  Last UTI was June 2020-Proteus resistant to [place] only.  Prior to that she had E. coli resistant to Cipro.  She responded well to Keflex for her last UTI.  ROS: See pertinent positives and negatives per HPI.  Patient Active Problem List   Diagnosis Date Noted  . Type 2 diabetes mellitus with retinopathy, with long-term current use of insulin ([place]) 05/17/2018  . Overweight (BMI 25.0-29.9) 02/11/2018  . Vitamin D deficiency 10/13/2016  . 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 Iron deficiency anemia 04/28/2016  . Other insomnia 03/14/2016  . DDD (degenerative disc disease), cervical 03/14/2016  . Spondylosis without myelopathy or radiculopathy, lumbar region 01/31/2016  . Medicare annual wellness visit, initial 08/06/2015  . Anxiety and depression 06/25/2014  . Psoriatic arthritis - sees rheumatologist 02/20/2014  . Asthma 12/14/2008  .  Diabetic peripheral neuropathy associated with type 2 diabetes mellitus ([place]) 08/06/2008  . Hyperlipemia 11/12/2006  . Essential hypertension 11/12/2006    Social History   Tobacco Use  . Smoking status: Never Smoker  . Smokeless tobacco: Never Used  Substance Use Topics  . Alcohol use: Yes    Comment: glass of wine-special occasion    Current Outpatient Medications:  .  albuterol (PROVENTIL HFA;VENTOLIN HFA) 108 (90 Base) MCG/ACT inhaler, Inhale 2 puffs into the lungs every 6 (six) hours as needed for wheezing or shortness of breath., Disp: 1 Inhaler, Rfl: 1 .  aspirin 81 MG tablet, Take 81 mg by mouth daily.  , Disp: , Rfl:  .  Biotin 5000 MCG TABS, Take 5,000 mcg by mouth daily. , Disp: , Rfl:  .  calcium-vitamin D 250-100 MG-UNIT per tablet, Take 1 tablet by mouth daily. , Disp: , Rfl:  .  clobetasol (TEMOVATE) 0.05 % external solution, Apply 1 application topically 2 (two) times daily., Disp: 50 mL, Rfl: 0 .  Clobetasol Propionate 0.05 % shampoo, Apply to affected area twice daily., Disp: 118 mL, Rfl: 0 .  Cyanocobalamin (VITAMIN B12 PO), Place under the tongue., Disp: , Rfl:  .  DULoxetine (CYMBALTA) 30 MG capsule, TAKE 1 CAPSULE BY MOUTH EVERY DAY, Disp: 90 capsule, Rfl: 1 .  DULoxetine (CYMBALTA) 60 MG capsule, Take 1 capsule (60 mg total) by mouth daily., Disp: 90 capsule, Rfl: 1 .  fluocinonide (LIDEX) 0.05 % external solution, apply 1 ML ON THE SKIN 2 TIMES DAILY AND apply TO SCALP FOR 1 TO 2 WEEKS AS NEEDED FOR  flares, Disp: , Rfl:  .  fluticasone (FLONASE) 50 MCG/ACT nasal spray, Place 2 sprays into both nostrils daily. (Patient taking differently: Place 2 sprays into both nostrils as needed. ), Disp: 16 g, Rfl: 6 .  gabapentin (NEURONTIN) 100 MG capsule, TAKE 1 CAPSULE BY MOUTH NIGHTLY AT BEDTIME (Patient taking differently: at bedtime as needed. ), Disp: 30 capsule, Rfl: 1 .  glucose blood (ONETOUCH VERIO) test strip, USE TO TEST BLOOD SUGAR TWICE A DAY DX: E11.319, Disp:  200 each, Rfl: 0 .  insulin aspart (NOVOLOG FLEXPEN) 100 UNIT/ML FlexPen, INJECT 6 TO 8 UNITS UNDER THE SKIN THREE TIMES DAILY WITH MEALS, PLUS SLIDING SCALE (Patient taking differently: INJECT 2 TO 4 UNITS UNDER THE SKIN THREE TIMES DAILY WITH MEALS, PLUS SLIDING SCALE), Disp: 15 mL, Rfl: 2 .  Insulin Syringe-Needle U-100 (B-D INS SYR MICROFINE 1CC/27G) 27G X 5/8" 1 ML MISC, Use to inject levemir once daily DX: E11.319, Disp: 120 each, Rfl: 6 .  LEVEMIR 100 UNIT/ML injection, INJECT 0.3 ML (30 UNITS) INTO THE SKIN AT BEDTIME (Patient taking differently: 15 Units at bedtime. ), Disp: 30 mL, Rfl: 3 .  lisinopril (ZESTRIL) 2.5 MG tablet, Take 1 tablet (2.5 mg total) by mouth daily., Disp: 90 tablet, Rfl: 1 .  metFORMIN (GLUCOPHAGE) 1000 MG tablet, TAKE 1 TABLET BY MOUTH 2 TIMES DAILY WITH MEALS, Disp: 180 tablet, Rfl: 1 .  methocarbamol (ROBAXIN) 500 MG tablet, Take 1 tablet (500 mg total) by mouth daily as needed for muscle spasms., Disp: 30 tablet, Rfl: 0 .  metoprolol tartrate (LOPRESSOR) 25 MG tablet, Take 1 tablet (25 mg total) by mouth 2 (two) times daily., Disp: 180 tablet, Rfl: 1 .  Multiple Vitamin (MULTIVITAMIN) tablet, Take 1 tablet by mouth daily. , Disp: , Rfl:  .  pravastatin (PRAVACHOL) 20 MG tablet, TAKE 1 TABLET BY MOUTH EVERY DAY, Disp: 90 tablet, Rfl: 3 .  terbinafine (LAMISIL) 250 MG tablet, Take 1 tablet (250 mg total) by mouth daily., Disp: 90 tablet, Rfl: 0 .  Vitamin D, Ergocalciferol, (DRISDOL) 1.25 MG (50000 UT) CAPS capsule, TAKE 1 CAPSULE BY MOUTH every 7 DAYS, Disp: 15 capsule, Rfl: 3 .  zolpidem (AMBIEN) 5 MG tablet, TAKE 1 TABLET BY MOUTH AT BEDTIME AS NEEDED FOR insomnia, Disp: 30 tablet, Rfl: 0 .  cephALEXin (KEFLEX) 500 MG capsule, Take 1 capsule (500 mg total) by mouth 4 (four) times daily., Disp: 28 capsule, Rfl: 0  No Known Allergies  OBJECTIVE: Ht 5\' 4"  (1.626 m)   LMP  (LMP Unknown)   BMI 28.49 kg/m  Gen: No acute distress. Nontoxic in appearance.  HENT:  AT. [place].  MMM.  Eyes:Pupils Equal Round Reactive to light, Extraocular movements intact,  Conjunctiva without redness, discharge or icterus. Neuro: Normal gait. Alert. Oriented x3  Psych: Normal affect and demeanor. Normal speech. Normal thought content and judgment.  Results for orders placed or performed in visit on 06/24/19 (from the past 24 hour(s))  POCT Urinalysis Dipstick (Automated)     Status: Abnormal   Collection Time: 06/24/19  8:42 AM  Result Value Ref Range   Color, UA yellow    Clarity, UA cloudy    Glucose, UA Negative Negative   Bilirubin, UA 1+    Ketones, UA negative    Spec Grav, UA 1.025 1.010 - 1.025   Blood, UA negative    pH, UA 6.0 5.0 - 8.0   Protein, UA Positive (A) Negative   Urobilinogen, UA 0.2 0.2 or 1.0  E.U./dL   Nitrite, UA negative    Leukocytes, UA Negative Negative     ASSESSMENT AND PLAN: Priscilla [name] is a 69 y.o. female present for  Dysuria/abnormal urine Rest.  Hydrate. Prescribed Keflex x7 days.  Will send for urine culture. Point-of-care urine today is not extremely impressive for UTI.  However her prior UTI did not show much in the way of a point-of-care urine either and was positive. - POCT Urinalysis Dipstick (Automated) - Urine Culture Follow-up as needed    Howard Pouch, DO 06/24/2019   As needed  Orders Placed This Encounter  Procedures  . Urine Culture  . POCT Urinalysis Dipstick (Automated)   Meds ordered this encounter  Medications  . cephALEXin (KEFLEX) 500 MG capsule    Sig: Take 1 capsule (500 mg total) by mouth 4 (four) times daily.    Dispense:  28 capsule    Refill:  0   Referral Orders  No referral(s) requested today

## 2019-06-26 LAB — URINE CULTURE
MICRO NUMBER:: 10256889
SPECIMEN QUALITY:: ADEQUATE

## 2019-07-01 ENCOUNTER — Ambulatory Visit (INDEPENDENT_AMBULATORY_CARE_PROVIDER_SITE_OTHER): Payer: Medicare Other | Admitting: Podiatry

## 2019-07-01 ENCOUNTER — Other Ambulatory Visit: Payer: Self-pay

## 2019-07-01 ENCOUNTER — Encounter: Payer: Self-pay | Admitting: Podiatry

## 2019-07-01 VITALS — Temp 97.9°F

## 2019-07-01 DIAGNOSIS — L603 Nail dystrophy: Secondary | ICD-10-CM

## 2019-07-01 MED ORDER — TERBINAFINE HCL 250 MG PO TABS: 250.0000 mg | ORAL_TABLET | Freq: Every day | ORAL | 0 refills | Status: DC

## 2019-07-01 MED ORDER — GABAPENTIN 100 MG PO CAPS: 100.0000 mg | ORAL_CAPSULE | Freq: Every day | ORAL | 0 refills | Status: DC

## 2019-07-01 NOTE — Progress Notes (Signed)
She presents today for follow-up of her Lamisil therapy and her gabapentin.  She states that she is doing really well with both of these and her toenails seem to be growing out with exception of the fourth and fifth toes of the right foot she denies fever chills nausea vomiting muscle aches pains itching or rashes.  Objective: Vital signs are stable alert oriented x3 nails appear to be resolving fungus with the exception of the fourth and fifth digits of the right foot.  Assessment: Slowly resolving onychomycosis and well-controlled neuropathy with gabapentin.  Plan: Renew her gabapentin today also sent that to her regular pharmacy.  Refilled Lamisil 250 mg tablets 1 p.o. every other day and sent this to [facility] in [place].  Follow-up with her in 3 months.

## 2019-07-02 NOTE — Telephone Encounter (Signed)
Received notification from [facility] regarding a prior authorization for Clobetasol Propionate Shampoo. Authorization has been APPROVED from 04/02/2019 to 06/30/2020.

## 2019-07-04 ENCOUNTER — Telehealth: Payer: Self-pay | Admitting: *Deleted

## 2019-07-04 NOTE — Telephone Encounter (Signed)
Patient advised we have received a drug utilization from [facility].  Patient is on low dose Ambien 5 mg PRN. Patient advised to take very sparingly.   Patient states she takes it every night. Patient states she does not sleep otherwise. Patient states used to be on Flexeril and Xanax as well. Patient states she intends to take the Ambien every night.

## 2019-07-11 ENCOUNTER — Other Ambulatory Visit: Payer: Self-pay | Admitting: Physician Assistant

## 2019-07-14 NOTE — Telephone Encounter (Signed)
Last Visit: 06/04/19 Next Visit: 09/03/19  Last Fill 06/12/19  Okay to refill Ambien?

## 2019-08-12 ENCOUNTER — Other Ambulatory Visit: Payer: Self-pay | Admitting: Physician Assistant

## 2019-08-12 NOTE — Telephone Encounter (Signed)
Last Visit: 06/04/2019 Next Visit: 09/03/2019  Last fill: 07/14/2019   Okay to refill Lorrin Mais?

## 2019-08-18 DIAGNOSIS — L281 Prurigo nodularis: Secondary | ICD-10-CM | POA: Diagnosis not present

## 2019-08-18 DIAGNOSIS — L218 Other seborrheic dermatitis: Secondary | ICD-10-CM | POA: Diagnosis not present

## 2019-08-21 ENCOUNTER — Other Ambulatory Visit: Payer: Self-pay

## 2019-08-22 ENCOUNTER — Telehealth: Payer: Self-pay | Admitting: *Deleted

## 2019-08-22 NOTE — Telephone Encounter (Signed)
Received an alert from Insurance regarding Ambien and Gabapentin. Reviewed by Hazel Sams, PA-C  Per Lovena Le: Please notify the patient we have received a second alert about use of Ambien and Gabapentin increasing the risk of respiration depression. Please advise the patient to only take Ambien as needed.   Spoke with patient and advised  we have received a second alert about use of Ambien and Gabapentin increasing the risk of respiration depression. Advised patient to only take Ambien as needed. Patient states she needs to take Ambien nightly to sleep and states she will discontinue Gabapentin.

## 2019-08-22 NOTE — Progress Notes (Signed)
Office Visit Note  Patient: Priscilla [name]             Date of Birth: 1951/03/17           MRN: [id number]             PCP: Ma Hillock, DO Referring: Ma Hillock, DO Visit Date: 09/03/2019 Occupation: @GUAROCC @  Subjective:  Fatigue   History of Present Illness: DAYANIS YUILLE is a 69 y.o. female with history of psoriatic arthritis, osteoarthritis, and fibromyalgia.  Patient is not taking any immunosuppressive agents for management of psoriatic arthritis due to being in remission.  She has not had any recent psoriatic arthritis flares.  She states she experiences intermittent SI joint pain and receives cortisone injections by Dr. Ernestina Patches.  She has an upcoming appointment on 09/22/2019 with Dr. Ernestina Patches.  She denies any Achilles tendinitis or plantar fasciitis.  She denies any active psoriasis at this time.  She states that the psoriasis on her scalp resolved after using a topical solution prescribed by her dermatologist.  She denies any increased joint pain or joint swelling recently.  She continues to have generalized myalgias and muscle tenderness due to underlying fibromyalgia.  She has significant fatigue as well as difficulty falling asleep at night.  She is taking Ambien 5 mg 1 tablet by mouth at bedtime for insomnia.  She is only been sleeping about 4 hours per night.  Patient reports that she was previously on Cymbalta 90 mg daily but there was a mixup with refills and she is only been taking 60 mg a day.  She has noticed some mood changes on the reduced dose but has not noticed any increased pain.   Activities of Daily Living:  Patient reports morning stiffness for 45-60 minutes.   Patient Reports nocturnal pain.  Difficulty dressing/grooming: Denies Difficulty climbing stairs: Denies Difficulty getting out of chair: Denies Difficulty using hands for taps, buttons, cutlery, and/or writing: Reports  Review of Systems  Constitutional: Positive for fatigue.  HENT:  Negative for mouth sores, mouth dryness and nose dryness.   Eyes: Positive for dryness. Negative for pain and itching.  Respiratory: Negative for shortness of breath and difficulty breathing.   Cardiovascular: Negative for chest pain and palpitations.  Gastrointestinal: Negative for blood in stool, constipation and diarrhea.  Endocrine: Negative for increased urination.  Genitourinary: Negative for difficulty urinating.  Musculoskeletal: Positive for arthralgias, joint pain, myalgias, morning stiffness, muscle tenderness and myalgias. Negative for joint swelling and muscle weakness.  Skin: Positive for color change. Negative for rash and redness.  Allergic/Immunologic: Negative for susceptible to infections.  Neurological: Positive for numbness. Negative for dizziness, headaches, memory loss and weakness.  Hematological: Negative for bruising/bleeding tendency.  Psychiatric/Behavioral: Positive for sleep disturbance. Negative for confusion.    PMFS History:  Patient Active Problem List   Diagnosis Date Noted  . Type 2 diabetes mellitus with retinopathy, with long-term current use of insulin ([place]) 05/17/2018  . Overweight (BMI 25.0-29.9) 02/11/2018  . Vitamin D deficiency 10/13/2016  . 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 Iron deficiency anemia 04/28/2016  . Other insomnia 03/14/2016  . DDD (degenerative disc disease), cervical 03/14/2016  . Spondylosis without myelopathy or radiculopathy, lumbar region 01/31/2016  . Medicare annual wellness visit, initial 08/06/2015  . Anxiety and depression 06/25/2014  . Psoriatic arthritis - sees rheumatologist 02/20/2014  . Asthma 12/14/2008  . Diabetic peripheral neuropathy associated with type 2 diabetes mellitus (  [place]) 08/06/2008  . Hyperlipemia 11/12/2006  . Essential hypertension 11/12/2006    Past Medical History:  Diagnosis Date  . Anemia 12/14/2008  . Anxiety and depression 12/14/2008  .  Asthma 11/12/2006  . CAD (coronary artery disease) 12/14/2008   hx transluminal coronary angioplasty  . Cataract   . Depression   . Diabetes mellitus with neuropathy ([place]) 12/14/2008  . Fibromyalgia 01/04/2007   sees rheuamtology  . 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 Hyperlipemia 01/21/2010  . Hypertension 12/14/2008  . Leg edema 12/14/2008  . 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 OSA (obstructive sleep apnea) 08/06/2008  . Osteoarthritis 12/14/2008  . Palpitations 11/12/2006   hx sinus tachy  . Psoriatic arthritis [facility])    sees rheumatology  . Rotator cuff injury     Family History  Problem Relation Age of Onset  . Arthritis Mother   . Alzheimer's disease Mother   . Stroke Father   . Alcohol abuse Father   . Diabetes Father   . Heart disease Father   . Early death Father 42  . Diabetes Sister   . Arthritis Sister   . COPD Sister   . Arthritis Maternal Aunt   . Diabetes Maternal Aunt   . Early death Maternal Aunt 67  . Diabetes Maternal Uncle   . Cancer Paternal Aunt   . COPD Paternal 52   . Heart disease Paternal Aunt   . Heart disease Paternal Uncle   . Alcohol abuse Paternal Grandfather   . Heart disease Paternal Grandfather   . Early death Paternal Grandfather 27  . Stroke Paternal Grandfather   . Hypertension Son   . Autoimmune disease Daughter   . Breast cancer Neg Hx    Past Surgical History:  Procedure Laterality Date  . ABDOMINAL HYSTERECTOMY    . CHOLECYSTECTOMY    . LAPAROSCOPIC GASTRIC BANDING    . PTCA    . WRIST SURGERY     LEFT   Social History   Social History Narrative   Married, Priscilla [name]. 2 children name Priscilla [name] and Priscilla [name].   Some college, retired Engineer, manufacturing.   Drinks caffeine.   Take a daily vitamin.   Wears her seatbelt, exercises 3 times a week.   Smoke detector in the home.   Firearms in the home.   Feels safe in her relationships.   Immunization History  Administered Date(s) Administered  . Fluad Quad(high Dose  65+) 12/03/2018  . Influenza Split 01/21/2013  . Influenza, High Dose Seasonal PF 02/04/2015, 02/09/2016, 02/11/2018  . Influenza,inj,Quad PF,6+ Mos 01/15/2014  . Influenza-Unspecified 01/26/2017  . Moderna SARS-COVID-2 Vaccination 05/24/2019, 06/21/2019  . Pneumococcal Conjugate-13 02/04/2015  . Pneumococcal Polysaccharide-23 10/31/2013  . Tdap 02/07/2013     Objective: Vital Signs: BP 122/77 (BP Location: Right Arm, Patient Position: Sitting, Cuff Size: Normal)   Pulse 75   Resp 16   Ht 5\' 4"  (1.626 m)   Wt 163 lb 12.8 oz (74.3 kg)   LMP  (LMP Unknown)   BMI 28.12 kg/m    Physical Exam Vitals and nursing note reviewed.  Constitutional:      Appearance: She is well-developed.  HENT:     Head: Normocephalic and atraumatic.  Eyes:     Conjunctiva/sclera: Conjunctivae normal.  Pulmonary:     Effort: Pulmonary effort is normal.  Abdominal:     General: Bowel sounds are normal.     Palpations: Abdomen is soft.  Musculoskeletal:     Cervical back: Normal range  of motion.  Lymphadenopathy:     Cervical: No cervical adenopathy.  Skin:    General: Skin is warm and dry.     Capillary Refill: Capillary refill takes less than 2 seconds.  Neurological:     Mental Status: She is alert and oriented to person, place, and time.  Psychiatric:        Behavior: Behavior normal.      Musculoskeletal Exam: C-spine limited ROM with discomfort. Thoracic kyphosis noted. Lumbar spine good ROM. Trapezius muscle tension and tenderness bilaterally. Right shoulder limited active and passive ROM with discomfort.  Left shoulder has full ROM with no discomfort. Elbow joints, wrist joints, MCPs, PIPs, and DIPs good ROM with no synovitis.  PIP and DIP thickening consistent with osteoarthritis of both hands. Hip joints have good ROM with no discomfort.  Knee joints good ROM with no warmth or effusion.  Ankle joint have good ROM with no tenderness or inflammation.   CDAI Exam: CDAI Score: -- Patient  Global: --; Provider Global: -- Swollen: --; Tender: -- Joint Exam 09/03/2019   No joint exam has been documented for this visit   There is currently no information documented on the homunculus. Go to the Rheumatology activity and complete the homunculus joint exam.  Investigation: No additional findings.  Imaging: No results found.  Recent Labs: Lab Results  Component Value Date   WBC 6.0 05/22/2019   HGB 13.8 05/22/2019   PLT 315 05/22/2019   NA 139 03/19/2019   K 4.7 03/19/2019   CL 102 03/19/2019   CO2 32 03/19/2019   GLUCOSE 124 (H) 03/19/2019   BUN 15 03/19/2019   CREATININE 0.77 03/19/2019   BILITOT 0.5 01/24/2019   ALKPHOS 53 02/11/2018   AST 15 01/24/2019   ALT 17 01/24/2019   PROT 6.5 01/24/2019   ALBUMIN 4.2 02/11/2018   CALCIUM 9.6 03/19/2019   GFRAA 77 10/13/2016    Speciality Comments: No specialty comments available.  Procedures:  No procedures performed Allergies: Patient has no known allergies.   Assessment / Plan:     Visit Diagnoses: Psoriatic arthritis ([place]) - In remission: She has no synovitis or dactylitis on exam.  She has not had any recent psoriatic arthritis flares.  She is not taking any immunosuppressive agents at this time.  Her psoriatic arthritis has been in remission.  She experiences intermittent arthralgias and myalgias due to underlying fibromyalgia as well as osteoarthritis but has not had any inflammation.  She is not experiencing any Achilles tendinitis or plantar fasciitis.  She experiences intermittent SI joint pain bilaterally, but does not have any tenderness on examination today.  She sees Dr. Ernestina Patches twice a year for facet joint injections.  She has an upcoming appointment on 09/22/2019.  She has no active psoriasis at this time.  She does not require immunosuppressive therapy at this time.  She was advised to notify us if she develops increased joint pain or joint swelling.  She will follow-up in the office in 6 months.    Psoriasis: No active psoriasis at this time.   Primary osteoarthritis of both hands:   DDD (degenerative disc disease), cervical: She has limited ROM of the C-spine.  Trapezius muscle tension and tenderness bilaterally.  She was encouraged to perform neck exercises.   Spondylosis without myelopathy or radiculopathy, lumbar region - She is followed by Dr. Ernestina Patches and has had facet joint injections in the past.  She has upcoming appointment with Dr. Ernestina Patches on 09/22/2019.  Fibromyalgia - She  has generalized hyperalgesia and positive tender points on exam.  She continues to have frequent fibromyalgia flares.  She is having trapezius muscle tension and muscle tenderness at this time.  She continues to experience intermittent lower back pain.  She was previously taking Cymbalta 90 mg by mouth daily but according to the patient there was a mixup with the refill and she has only been taking 60 mg daily.  We discussed increasing the dose to 90 mg daily with her PCP to help with her depression and anxiety.  She has been under increased stress recently which has been worsening her discomfort and fatigue.  She has had significant difficulty sleeping at night and is only sleeping 4 hours.  She takes Ambien 5 mg by mouth at bedtime but the reduced dose has not been very effective for her.  We discussed the importance of regular exercise and good sleep hygiene.  We also discussed a referral to physical therapy/aquatic therapy but she declined at this time.  Other fatigue - She has chronic fatigue secondary to insomnia.  The importance of regular exercise and good sleep hygiene were discussed.   Other insomnia: She has been having significant difficulty falling asleep as well as staying asleep.  She is been experiencing increased nocturnal pain which has been worsening her insomnia.  She takes Ambien 5 mg 1 tablet by mouth at bedtime to help her sleep.  We discussed the importance of good sleep hygiene.  Other  medical conditions are listed as follows:  Gastric bypass status for obesity  History of diabetes mellitus  History of carpal tunnel syndrome  History of scoliosis  History of depression  History of anxiety  History of anemia  History of sleep apnea  History of asthma  History of vitamin D deficiency  Orders: No orders of the defined types were placed in this encounter.  No orders of the defined types were placed in this encounter.     Follow-Up Instructions: Return in about 6 months (around 03/05/2020) for Psoriatic arthritis, Fibromyalgia.   Ofilia Neas, PA-C  Note - This record has been created using Dragon software.  Chart creation errors have been sought, but may not always  have been located. Such creation errors do not reflect on  the standard of medical care.

## 2019-08-25 ENCOUNTER — Encounter: Payer: Self-pay | Admitting: Internal Medicine

## 2019-08-25 ENCOUNTER — Other Ambulatory Visit: Payer: Self-pay

## 2019-08-25 ENCOUNTER — Ambulatory Visit (INDEPENDENT_AMBULATORY_CARE_PROVIDER_SITE_OTHER): Payer: Medicare Other | Admitting: Internal Medicine

## 2019-08-25 VITALS — BP 110/60 | HR 67 | Ht 64.0 in | Wt 164.0 lb

## 2019-08-25 DIAGNOSIS — E785 Hyperlipidemia, unspecified: Secondary | ICD-10-CM

## 2019-08-25 DIAGNOSIS — E11319 Type 2 diabetes mellitus with unspecified diabetic retinopathy without macular edema: Secondary | ICD-10-CM | POA: Diagnosis not present

## 2019-08-25 DIAGNOSIS — E1142 Type 2 diabetes mellitus with diabetic polyneuropathy: Secondary | ICD-10-CM | POA: Diagnosis not present

## 2019-08-25 DIAGNOSIS — Z794 Long term (current) use of insulin: Secondary | ICD-10-CM | POA: Diagnosis not present

## 2019-08-25 LAB — POCT GLYCOSYLATED HEMOGLOBIN (HGB A1C): Hemoglobin A1C: 6.3 % — AB (ref 4.0–5.6)

## 2019-08-25 MED ORDER — INSULIN DETEMIR 100 UNIT/ML ~~LOC~~ SOLN: 15.0000 [IU] | Freq: Every day | SUBCUTANEOUS | 3 refills | Status: DC

## 2019-08-25 NOTE — Addendum Note (Signed)
Addended by: Cardell Peach I on: 08/25/2019 03:35 PM   Modules accepted: Orders

## 2019-08-25 NOTE — Patient Instructions (Addendum)
Please continue: - Metformin 1000 mg 2x a day with meals - Novolog 4-8 units 15 minutes before a larger dinner  Please decrease: - Levemir to 15 units at bedtime  Please come back for a follow-up appointment in 3 months.

## 2019-08-25 NOTE — Progress Notes (Signed)
Patient ID: Priscilla [name], female   DOB: [birth year], 69 y.o.   MRN: [id number]  This visit occurred during the SARS-CoV-2 public health emergency.  Safety protocols were in place, including screening questions prior to the visit, additional usage of staff PPE, and extensive cleaning of exam room while observing appropriate contact time as indicated for disinfecting solutions.   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 She previously saw Drs Dwyane Dee (distant past) and [place] (more recently). Last visit with me 4 months ago.  She has a lot of stress at home due to her husband losing his memory.  Reviewed HbA1c levels: Lab Results  Component Value Date   HGBA1C 5.9 (A) 04/24/2019   HGBA1C 6.0 (A) 02/13/2019   HGBA1C 6.1 (A) 09/20/2018   HGBA1C 6.4 (A) 05/23/2018   HGBA1C 6.0 (A) 10/17/2017   HGBA1C 6.3 06/13/2017   HGBA1C 5.9 11/24/2016   HGBA1C 6.5 (H) 04/22/2016   HGBA1C 7.4 05/18/2015   HGBA1C 7.3 (A) 12/10/2014   HGBA1C 8.0 (H) 06/05/2014   HGBA1C 7.6 (H) 09/26/2013   HGBA1C 7.3 (H) 02/07/2013   HGBA1C 7.0 (H) 06/28/2012   HGBA1C 7.6 (H) 04/21/2011  03/16/2017: HbA1c calculated from fructosamine: 5.6% 10/18/2015: HbA1c calculated from fructosamine: 5.7% 02/2015: HbA1c 7.4%, HbA1c calculated from fructosamine: 5.6%  She is on: - Metformin 1000 mg 2x a day with meals - Levemir 30 >> 15 units 2x a day >> 15 units >> 10 >> 18 units at bedtime - Mealtime Novolog: 4-8 with dinner (at the start of the meal or right after she starts)    If sugars before the meal are 60 or lower, please do not take the Novolog dose. If sugars before the meal are 61-80, take only half of the Novolog dose. She was on Victoza 2.5 mg daily >> stopped as she could not afford this. On Pt.checks her sugars more than 4 times a day with his CGM.  Freestyle libre CGM  parameters: - Average:120 - % active CGM time: 95% of the time - Glucose variability 23.7% (target < or = to 36%) - time in range:  - very low (<54): 0% - low (54-69): 0% - normal range (70-180): 97% - high sugars (181-250): 3% - very high sugars (>250): 0%    Previously:   Lowest sugar was 46 >> 50-at night >> 50s >> 42 >> 60; she has hypoglycemia awareness in the in the 70s.   Highest sugar was 285 x1 (cake) >> 247 >> 224 >> 200.  Glucometer: Freestyle  -No CKD; last BUN/creatinine:  Lab Results  Component Value Date   BUN 15 03/19/2019   CREATININE 0.77 03/19/2019  ACR (12/2014): 6.9 On lisinopril 1.25 mg daily -+ HL; last set of lipids: Lab Results  Component Value Date   CHOL 185 03/19/2019   HDL 58.30 03/19/2019   LDLCALC 111 (H) 03/19/2019   TRIG 80.0 03/19/2019   CHOLHDL 3 03/19/2019  On pravastatin. 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  She has floaters. -+ Improved numbness, tingling, burning in feet.  She takes a B12 supplement.  I recommended alpha lipoic acid but she did not take this due to the large size of the pill.  She was started on Neurontin 100 mg daily by podiatry.  Her  latest TSH was normal: Lab Results  Component Value Date   TSH 1.97 03/19/2019   She also has a history of lap band surgery >> she does not usually eat large meals but she usually grazes, especially at night.  ROS: Constitutional: no weight gain/no weight loss, no fatigue, no subjective hyperthermia, no subjective hypothermia Eyes: no blurry vision, no xerophthalmia ENT: no sore throat, no nodules palpated in neck, no dysphagia, no odynophagia, no hoarseness Cardiovascular: no CP/no SOB/no palpitations/no leg swelling Respiratory: no cough/no SOB/no wheezing Gastrointestinal: no N/no V/no D/no C/no acid reflux Musculoskeletal: no muscle aches/no joint aches Skin: no rashes, no hair  loss Neurological: no tremors/+ numbness/+ tingling/no dizziness  I reviewed pt's medications, allergies, PMH, social hx, family hx, and changes were documented in the history of present illness. Otherwise, unchanged from my initial visit note.  Past Medical History:  Diagnosis Date  . Anemia 12/14/2008  . Anxiety and depression 12/14/2008  . Asthma 11/12/2006  . CAD (coronary artery disease) 12/14/2008   hx transluminal coronary angioplasty  . Cataract   . Depression   . Diabetes mellitus with neuropathy ([place]) 12/14/2008  . Fibromyalgia 01/04/2007   sees rheuamtology  . 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 Hyperlipemia 01/21/2010  . Hypertension 12/14/2008  . Leg edema 12/14/2008  . 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 OSA (obstructive sleep apnea) 08/06/2008  . Osteoarthritis 12/14/2008  . Palpitations 11/12/2006   hx sinus tachy  . Psoriatic arthritis [facility])    sees rheumatology  . Rotator cuff injury    Past Surgical History:  Procedure Laterality Date  . ABDOMINAL HYSTERECTOMY    . CHOLECYSTECTOMY    . LAPAROSCOPIC GASTRIC BANDING    . PTCA    . WRIST SURGERY     LEFT   Social History   Social History  . Marital Status: Married    Spouse Name: N/A  . Number of Children: N/A   Occupational History  . Not on file.   Social History Main Topics  . Smoking status: Never Smoker   . Smokeless tobacco: Never Used  . Alcohol Use: Yes     Comment: glass of wine-specially occasion  . Drug Use: No   Current Outpatient Medications on File Prior to Visit  Medication Sig Dispense Refill  . albuterol (PROVENTIL HFA;VENTOLIN HFA) 108 (90 Base) MCG/ACT inhaler Inhale 2 puffs into the lungs every 6 (six) hours as needed for wheezing or shortness of breath. 1 Inhaler 1  . aspirin 81 MG tablet Take 81 mg by mouth daily.      . Biotin 5000 MCG TABS Take 5,000 mcg by mouth daily.     . calcium-vitamin D 250-100 MG-UNIT per tablet Take 1 tablet by mouth daily.     . cephALEXin (KEFLEX)  500 MG capsule Take 1 capsule (500 mg total) by mouth 4 (four) times daily. 28 capsule 0  . clobetasol (TEMOVATE) 0.05 % external solution Apply 1 application topically 2 (two) times daily. 50 mL 0  . Clobetasol Propionate 0.05 % shampoo Apply to affected area twice daily. 118 mL 0  . Cyanocobalamin (VITAMIN B12 PO) Place under the tongue.    . DULoxetine (CYMBALTA) 30 MG capsule TAKE 1 CAPSULE BY MOUTH EVERY DAY 90 capsule 1  . DULoxetine (CYMBALTA) 60 MG capsule Take 1 capsule (60 mg total) by mouth daily. 90 capsule 1  . fluocinonide (LIDEX) 0.05 % external solution apply 1 ML ON THE SKIN 2 TIMES DAILY AND apply TO  SCALP FOR 1 TO 2 WEEKS AS NEEDED FOR flares    . fluticasone (FLONASE) 50 MCG/ACT nasal spray Place 2 sprays into both nostrils daily. (Patient taking differently: Place 2 sprays into both nostrils as needed. ) 16 g 6  . gabapentin (NEURONTIN) 100 MG capsule Take 1 capsule (100 mg total) by mouth at bedtime. 90 capsule 0  . glucose blood (ONETOUCH VERIO) test strip USE TO TEST BLOOD SUGAR TWICE A DAY DX: E11.319 200 each 0  . insulin aspart (NOVOLOG FLEXPEN) 100 UNIT/ML FlexPen INJECT 6 TO 8 UNITS UNDER THE SKIN THREE TIMES DAILY WITH MEALS, PLUS SLIDING SCALE (Patient taking differently: INJECT 2 TO 4 UNITS UNDER THE SKIN THREE TIMES DAILY WITH MEALS, PLUS SLIDING SCALE) 15 mL 2  . Insulin Syringe-Needle U-100 (B-D INS SYR MICROFINE 1CC/27G) 27G X 5/8" 1 ML MISC Use to inject levemir once daily DX: E11.319 120 each 6  . LEVEMIR 100 UNIT/ML injection INJECT 0.3 ML (30 UNITS) INTO THE SKIN AT BEDTIME (Patient taking differently: 15 Units at bedtime. ) 30 mL 3  . lisinopril (ZESTRIL) 2.5 MG tablet Take 1 tablet (2.5 mg total) by mouth daily. 90 tablet 1  . metFORMIN (GLUCOPHAGE) 1000 MG tablet TAKE 1 TABLET BY MOUTH 2 TIMES DAILY WITH MEALS 180 tablet 1  . methocarbamol (ROBAXIN) 500 MG tablet Take 1 tablet (500 mg total) by mouth daily as needed for muscle spasms. 30 tablet 0  .  metoprolol tartrate (LOPRESSOR) 25 MG tablet Take 1 tablet (25 mg total) by mouth 2 (two) times daily. 180 tablet 1  . Multiple Vitamin (MULTIVITAMIN) tablet Take 1 tablet by mouth daily.     . pravastatin (PRAVACHOL) 20 MG tablet TAKE 1 TABLET BY MOUTH EVERY DAY 90 tablet 3  . terbinafine (LAMISIL) 250 MG tablet Take 1 tablet (250 mg total) by mouth daily. 30 tablet 0  . Vitamin D, Ergocalciferol, (DRISDOL) 1.25 MG (50000 UT) CAPS capsule TAKE 1 CAPSULE BY MOUTH every 7 DAYS 15 capsule 3  . zolpidem (AMBIEN) 5 MG tablet TAKE 1 TABLET BY MOUTH AT BEDTIME AS NEEDED FOR INSOMNIA 30 tablet 0   No current facility-administered medications on file prior to visit.   No Known Allergies Family History  Problem Relation Age of Onset  . Arthritis Mother   . Alzheimer's disease Mother   . Stroke Father   . Alcohol abuse Father   . Diabetes Father   . Heart disease Father   . Early death Father 3  . Diabetes Sister   . Arthritis Sister   . COPD Sister   . Arthritis Maternal Aunt   . Diabetes Maternal Aunt   . Early death Maternal Aunt 60  . Diabetes Maternal Uncle   . Cancer Paternal Aunt   . COPD Paternal 15   . Heart disease Paternal Aunt   . Heart disease Paternal Uncle   . Alcohol abuse Paternal Grandfather   . Heart disease Paternal Grandfather   . Early death Paternal Grandfather 16  . Stroke Paternal Grandfather   . Hypertension Son   . Autoimmune disease Daughter   . Breast cancer Neg Hx    PE: BP 110/60   Pulse 67   Ht 5\' 4"  (1.626 m)   Wt 164 lb (74.4 kg)   LMP  (LMP Unknown)   SpO2 (!) 67%   BMI 28.15 kg/m  Body mass index is 28.15 kg/m. Wt Readings from Last 3 Encounters:  08/25/19 164 lb (74.4 kg)  06/04/19  166 lb (75.3 kg)  05/22/19 165 lb (74.8 kg)   Constitutional: overweight, in NAD Eyes: PERRLA, EOMI, no exophthalmos ENT: moist mucous membranes, no thyromegaly, no cervical lymphadenopathy Cardiovascular: RRR, No MRG Respiratory: CTA  B Gastrointestinal: abdomen soft, NT, ND, BS+ Musculoskeletal: no deformities, strength intact in all 4 Skin: moist, warm, no rashes Neurological: no tremor with outstretched hands, DTR normal in all 4  ASSESSMENT: 1. DM2, insulin-dependent, uncontrolled, with complications - Diabetic retinopathy left eye - resolved - Peripheral neuropathy  Component     Latest Ref Rng & Units 04/24/2019  Hemoglobin A1C     4.0 - 5.6 % 5.9 (A)  Islet Cell Ab     Neg:<1:1 Negative  ZNT8 Antibodies     U/mL <15  C-Peptide     0.80 - 3.85 ng/mL 2.35  Glucose, Plasma     65 - 99 mg/dL 130 (H)  Glutamic Acid Decarb Ab     <5 IU/mL <5  No insulin deficiency or pancreatic autoimmunity.  2. HL  3. PN   PLAN:  1. Patient with longstanding, previously uncontrolled, type 2 diabetes, on Metformin and basal-bolus insulin regimen, with improved control in the last 2 years.  After her gastric bypass surgery, she was grading and not having large meals.  We reduced her insulin doses and she is now using a very low dose of basal insulin, only 6 units.  At last visit we checked her for type 1 diabetes to make sure we can stop insulin if needed in the future.  The tests were negative. -At this visit, she tells me that she did not decrease the dose of Levemir to 60 units, and in fact, she takes 18 units.  She may have sugars in the 60s overnight as checked by her freestyle libre 2 CGM.  We discussed that whenever her CGM alerts her, to check her blood sugars to make sure these are real lows and not erroneous readings.  However, for now, I also advised her to decrease the dose of Levemir to only 15 units at bedtime.  Her sugars are slightly higher after meals, and, upon questioning, she is not taking NovoLog before dinner with large meals.  We discussed that she needs to do that.  Otherwise, I do not feel we need to change her regimen for now. - I advised her to:  Patient Instructions  Please continue: - Metformin 1000  mg 2x a day with meals - Novolog 4-8 units 15 minutes before a larger dinner  Please decrease: - Levemir to 15 units at bedtime  Please come back for a follow-up appointment in 3 months.  - we checked her HbA1c: 6.3% (higher) - advised to check sugars at different times of the day - 4x a day, rotating check times - advised for yearly eye exams >> she is UTD - return to clinic in 3-4 months   2. HL -Reviewed latest lipid panel from 03/2019: LDL above goal, the rest of the fractions at goal Lab Results  Component Value Date   CHOL 185 03/19/2019   HDL 58.30 03/19/2019   LDLCALC 111 (H) 03/19/2019   TRIG 80.0 03/19/2019   CHOLHDL 3 03/19/2019  -Continues the statin without side effects  3. PN -Due to diabetes -Continues on B12 and Cymbalta and she was also started on Neurontin by podiatry.  These are helping. -I suggested alpha-lipoic acid in the past but she could not take it due to the large size of the pill  Philemon Kingdom, MD PhD [facility]

## 2019-08-25 NOTE — Telephone Encounter (Signed)
Ok

## 2019-08-26 NOTE — Telephone Encounter (Signed)
Pt is scheduled for 09/22/19 with a driver.

## 2019-09-03 ENCOUNTER — Other Ambulatory Visit: Payer: Self-pay

## 2019-09-03 ENCOUNTER — Encounter: Payer: Self-pay | Admitting: Rheumatology

## 2019-09-03 ENCOUNTER — Ambulatory Visit (INDEPENDENT_AMBULATORY_CARE_PROVIDER_SITE_OTHER): Payer: Medicare Other | Admitting: Physician Assistant

## 2019-09-03 VITALS — BP 122/77 | HR 75 | Resp 16 | Ht 64.0 in | Wt 163.8 lb

## 2019-09-03 DIAGNOSIS — Z8639 Personal history of other endocrine, nutritional and metabolic disease: Secondary | ICD-10-CM

## 2019-09-03 DIAGNOSIS — Z8739 Personal history of other diseases of the musculoskeletal system and connective tissue: Secondary | ICD-10-CM

## 2019-09-03 DIAGNOSIS — R5383 Other fatigue: Secondary | ICD-10-CM

## 2019-09-03 DIAGNOSIS — M47816 Spondylosis without myelopathy or radiculopathy, lumbar region: Secondary | ICD-10-CM | POA: Diagnosis not present

## 2019-09-03 DIAGNOSIS — M19042 Primary osteoarthritis, left hand: Secondary | ICD-10-CM

## 2019-09-03 DIAGNOSIS — Z8669 Personal history of other diseases of the nervous system and sense organs: Secondary | ICD-10-CM

## 2019-09-03 DIAGNOSIS — L4050 Arthropathic psoriasis, unspecified: Secondary | ICD-10-CM | POA: Diagnosis not present

## 2019-09-03 DIAGNOSIS — M19041 Primary osteoarthritis, right hand: Secondary | ICD-10-CM

## 2019-09-03 DIAGNOSIS — Z9884 Bariatric surgery status: Secondary | ICD-10-CM

## 2019-09-03 DIAGNOSIS — L409 Psoriasis, unspecified: Secondary | ICD-10-CM

## 2019-09-03 DIAGNOSIS — G4709 Other insomnia: Secondary | ICD-10-CM

## 2019-09-03 DIAGNOSIS — M5030 Other cervical disc degeneration, unspecified cervical region: Secondary | ICD-10-CM | POA: Diagnosis not present

## 2019-09-03 DIAGNOSIS — Z862 Personal history of diseases of the blood and blood-forming organs and certain disorders involving the immune mechanism: Secondary | ICD-10-CM

## 2019-09-03 DIAGNOSIS — M797 Fibromyalgia: Secondary | ICD-10-CM | POA: Diagnosis not present

## 2019-09-03 DIAGNOSIS — Z8659 Personal history of other mental and behavioral disorders: Secondary | ICD-10-CM

## 2019-09-03 DIAGNOSIS — Z8709 Personal history of other diseases of the respiratory system: Secondary | ICD-10-CM

## 2019-09-08 ENCOUNTER — Other Ambulatory Visit: Payer: Self-pay | Admitting: Podiatry

## 2019-09-10 ENCOUNTER — Telehealth: Payer: Self-pay | Admitting: Podiatry

## 2019-09-10 NOTE — Telephone Encounter (Signed)
Pt called and would like a refill of lyrca sent to [facility] on [address]  [place]

## 2019-09-11 ENCOUNTER — Other Ambulatory Visit: Payer: Self-pay | Admitting: Podiatry

## 2019-09-11 ENCOUNTER — Other Ambulatory Visit: Payer: Self-pay | Admitting: Physician Assistant

## 2019-09-11 NOTE — Telephone Encounter (Signed)
Last visit: 09/03/2019 Next visit: 02/25/2020  Last Fill: 08/12/2019  Okay to refill Ambien?

## 2019-09-11 NOTE — Telephone Encounter (Signed)
I spoke with pt, and asked if she had taken 90 doses of Lamisil and she stated yes but Dr. Milinda Pointer had wanted her to take Lamisil another month 1 tablet every other day. I reviewed the clinicals and he had ordered Lamisil every other day #30 and for pt to follow up in 3 months. I explained that he had wanted her to take 30 doses Lamisil every other day, but not for 3 more months, just to follow up in 3 months from 07/01/2019. I asked pt if she had take 30 doses of the Lamisil every other day and she stated she had and I told her then we just needed her to reschedule in June. I transferred pt to [place].

## 2019-09-12 ENCOUNTER — Telehealth: Payer: Self-pay | Admitting: Rheumatology

## 2019-09-12 ENCOUNTER — Telehealth: Payer: Self-pay

## 2019-09-12 DIAGNOSIS — F32A Depression, unspecified: Secondary | ICD-10-CM

## 2019-09-12 MED ORDER — DULOXETINE HCL 60 MG PO CPEP: 60.0000 mg | ORAL_CAPSULE | Freq: Every day | ORAL | 1 refills | Status: DC

## 2019-09-12 MED ORDER — DULOXETINE HCL 30 MG PO CPEP: ORAL_CAPSULE | ORAL | 1 refills | Status: DC

## 2019-09-12 MED ORDER — ZOLPIDEM TARTRATE 5 MG PO TABS: ORAL_TABLET | ORAL | 0 refills | Status: DC

## 2019-09-12 NOTE — Telephone Encounter (Signed)
Ok to send in cymbalta 90 mg daily.  Discontinue the 60 mg capsule on her medication list please.

## 2019-09-12 NOTE — Telephone Encounter (Signed)
Patient she would like to increase her Cymbalta back to 90 mg daily. Cancelled Ambien at [facility]

## 2019-09-12 NOTE — Telephone Encounter (Signed)
Last visit: 09/03/2019 Next visit: 02/25/2020  Last Fill: 08/12/2019   Per office note on 09/03/2019: Cymbalta 90 mg by mouth daily but according to the patient there was a mixup with the refill and she has only been taking 60 mg daily.   Patient would like to change pharmacies and [facility] sent to prescription refill for Ambien on 09/11/2019. Okay to cancel at [facility] and send to [facility]? Okay to refill Cymbalta and at which dose?

## 2019-09-12 NOTE — Telephone Encounter (Signed)
Ok to refill ambien at [facility] if [facility].   Please ask the patient if she would like to increase the dose of cymbalta back to 90 mg? I think she would benefit from returning to her original dose of 90 mg daily. Ok to refill once she decides which dose she would prefer to stay on.

## 2019-09-12 NOTE — Telephone Encounter (Signed)
Patient called in to change pharmacy and needs a medication refill     lisinopril (ZESTRIL) 2.5 MG tablet  metoprolol tartrate (LOPRESSOR) 25 MG tablet    [id number] - [place], [place] - [address] AT [address] [place]

## 2019-09-12 NOTE — Telephone Encounter (Signed)
Patient called requesting prescription refills of Duloxetine and Zolpidem to be sent to her NEW PHARMACY:  [facility] at [address]   Patient states she is out of Duloxetine and only has 2 tablets remaining of her Zolpidem.

## 2019-09-15 ENCOUNTER — Telehealth: Payer: Self-pay | Admitting: Rheumatology

## 2019-09-15 ENCOUNTER — Telehealth: Payer: Self-pay

## 2019-09-15 ENCOUNTER — Other Ambulatory Visit: Payer: Self-pay | Admitting: Family Medicine

## 2019-09-15 DIAGNOSIS — I10 Essential (primary) hypertension: Secondary | ICD-10-CM

## 2019-09-15 MED ORDER — METOPROLOL TARTRATE 25 MG PO TABS: 25.0000 mg | ORAL_TABLET | Freq: Two times a day (BID) | ORAL | 0 refills | Status: DC

## 2019-09-15 MED ORDER — LISINOPRIL 2.5 MG PO TABS: 2.5000 mg | ORAL_TABLET | Freq: Every day | ORAL | 0 refills | Status: DC

## 2019-09-15 NOTE — Telephone Encounter (Signed)
Patient refill request  lisinopril (ZESTRIL) 2.5 MG tablet [[id number]]   metoprolol tartrate (LOPRESSOR) 25 MG tablet [[id number]]   DULoxetine (CYMBALTA) 30 MG capsule [[id number]]   [id number] - [place], [place] - [address] AT [place]

## 2019-09-15 NOTE — Telephone Encounter (Signed)
Pt was called and told Rheumatology had refilled her medication, Cymbalta, and that should be at the pharmacy. Pt was sent short supply of BP meds and appt was scheduled for 6 month [facility].

## 2019-09-15 NOTE — Telephone Encounter (Signed)
Patient request refill on generic Ambien sent to [facility] on [address]. Patient is completely out.

## 2019-09-15 NOTE — Telephone Encounter (Signed)
Per [facility] pharmacy, prescription was received and placed on patient's profile. They will fill rx for patient today. Advised patient and she is aware.

## 2019-09-22 ENCOUNTER — Encounter: Payer: Self-pay | Admitting: Physical Medicine and Rehabilitation

## 2019-09-22 ENCOUNTER — Other Ambulatory Visit: Payer: Self-pay

## 2019-09-22 ENCOUNTER — Ambulatory Visit (INDEPENDENT_AMBULATORY_CARE_PROVIDER_SITE_OTHER): Payer: Medicare Other | Admitting: Physical Medicine and Rehabilitation

## 2019-09-22 ENCOUNTER — Ambulatory Visit: Payer: Self-pay

## 2019-09-22 VITALS — BP 120/63 | HR 79

## 2019-09-22 DIAGNOSIS — M47816 Spondylosis without myelopathy or radiculopathy, lumbar region: Secondary | ICD-10-CM | POA: Diagnosis not present

## 2019-09-22 MED ORDER — METHYLPREDNISOLONE ACETATE 80 MG/ML IJ SUSP
80.0000 mg | Freq: Once | INTRAMUSCULAR | Status: AC
  Administered 2019-09-22: 80 mg

## 2019-09-22 NOTE — Progress Notes (Signed)
Pt states pain in the center of the lower back radiates into both buttocks. Pt states last injection 11/18/2018 helped out a lot. Pt states pain returned around February 2021. Bending backwards and standing in one spot makes pain worse. Heating pad helps with pain.   .Numeric Pain Rating Scale and Functional Assessment Average Pain 4   In the last MONTH (on 0-10 scale) has pain interfered with the following?  1. General activity like being  able to carry out your everyday physical activities such as walking, climbing stairs, carrying groceries, or moving a chair?  Rating(5)   +Driver, -BT, -Dye Allergies.

## 2019-09-23 ENCOUNTER — Ambulatory Visit (INDEPENDENT_AMBULATORY_CARE_PROVIDER_SITE_OTHER): Payer: Medicare Other | Admitting: Family Medicine

## 2019-09-23 ENCOUNTER — Encounter: Payer: Self-pay | Admitting: Family Medicine

## 2019-09-23 DIAGNOSIS — I10 Essential (primary) hypertension: Secondary | ICD-10-CM | POA: Diagnosis not present

## 2019-09-23 DIAGNOSIS — E785 Hyperlipidemia, unspecified: Secondary | ICD-10-CM | POA: Diagnosis not present

## 2019-09-23 MED ORDER — PRAVASTATIN SODIUM 20 MG PO TABS: 20.0000 mg | ORAL_TABLET | Freq: Every day | ORAL | 11 refills | Status: DC

## 2019-09-23 MED ORDER — METOPROLOL TARTRATE 25 MG PO TABS: 25.0000 mg | ORAL_TABLET | Freq: Two times a day (BID) | ORAL | 5 refills | Status: DC

## 2019-09-23 MED ORDER — SERTRALINE HCL 50 MG PO TABS
50.0000 mg | ORAL_TABLET | Freq: Every day | ORAL | 5 refills | Status: DC
Start: 2019-09-23 — End: 2020-03-30

## 2019-09-23 MED ORDER — LISINOPRIL 2.5 MG PO TABS: 2.5000 mg | ORAL_TABLET | Freq: Every day | ORAL | 5 refills | Status: DC

## 2019-09-23 NOTE — Progress Notes (Signed)
MEYLI BOICE - 69 y.o. female MRN [id number]  Date of birth: 1950/06/24  Office Visit Note: Visit Date: 09/22/2019 PCP: Ma Hillock, DO Referred by: Ma Hillock, DO  Subjective: No chief complaint on file.  HPI:  CLEOLA PERRYMAN is a 69 y.o. female who comes in today for planned repeat bilateral L4-L5 lumbar facet/medial branch block with fluoroscopic guidance.  The patient has failed conservative care includinBg home exercise, medications, time and activity modification.  This injection will be diagnostic and hopefully therapeutic.  Please see requesting physician notes for further details and justification.   Patient received more than 50% pain relief from prior injection.  Last injection was August 2020.  Patient represents 1 of those difficult cases where intra-articular injection probably makes more sense than ablation at this point.  Her complicated spine history is that she does have lumbar stenosis but no radicular leg pain.  She does have diabetes with history of polyneuropathy.  She does get some tingling in the feet obviously and burning at times but it is not her main complaint.  Diagnostically facet joint injections at L4-5 gave her great relief and she did well for about 6 months.  She has had slowly progressive symptoms since that time with no red flag complaints or no new trauma.  MRI reviewed with images and spine model.  MRI reviewed in the note below.     ROS Otherwise per HPI.  Assessment & Plan: Visit Diagnoses:  1. Spondylosis without myelopathy or radiculopathy, lumbar region     Plan: No additional findings.   Meds & Orders:  Meds ordered this encounter  Medications  . methylPREDNISolone acetate (DEPO-MEDROL) injection 80 mg    Orders Placed This Encounter  Procedures  . Facet Injection  . XR C-ARM NO REPORT    Follow-up: Return if symptoms worsen or fail to improve.   Procedures: No procedures performed  Lumbar Facet Joint  Intra-Articular Injection(s) with Fluoroscopic Guidance  Patient: DESHANA ROMINGER      Date of Birth: 11-20-1950 MRN: [id number] PCP: Ma Hillock, DO      Visit Date: 09/22/2019   Universal Protocol:    Date/Time: 09/22/2019  Consent Given By: the patient  Position: PRONE   Additional Comments: Vital signs were monitored before and after the procedure. Patient was prepped and draped in the usual sterile fashion. The correct patient, procedure, and site was verified.   Injection Procedure Details:  Procedure Site One Meds Administered:  Meds ordered this encounter  Medications  . methylPREDNISolone acetate (DEPO-MEDROL) injection 80 mg     Laterality: Bilateral  Location/Site:  L4-L5  Needle size: 22 guage  Needle type: Spinal  Needle Placement: Articular  Findings:  -Comments: Excellent flow of contrast producing a partial arthrogram.  Procedure Details: The fluoroscope beam is vertically oriented in AP, and the inferior recess is visualized beneath the lower pole of the inferior apophyseal process, which represents the target point for needle insertion. When direct visualization is difficult the target point is located at the medial projection of the vertebral pedicle. The region overlying each aforementioned target is locally anesthetized with a 1 to 2 ml. volume of 1% Lidocaine without Epinephrine.   The spinal needle was inserted into each of the above mentioned facet joints using biplanar fluoroscopic guidance. A 0.25 to 0.5 ml. volume of Isovue-250 was injected and a partial facet joint arthrogram was obtained. A single spot film was obtained of the resulting arthrogram.  One to 1.25 ml of the steroid/anesthetic solution was then injected into each of the facet joints noted above.   Additional Comments:  The patient tolerated the procedure well Dressing: 2 x 2 sterile gauze and Band-Aid    Post-procedure details: Patient was observed during the  procedure. Post-procedure instructions were reviewed.  Patient left the clinic in stable condition.     Clinical History: L3-L4: Eccentric left mild broad-based disc bulge. Moderate bilateral facet arthropathy. Mild spinal stenosis. No evidence of neural foraminal stenosis.  L4-L5: Moderate broad-based disc bulge with a posterior annular fissure. Severe bilateral facet arthropathy with ligamentum flavum infolding resulting in severe spinal stenosis and bilateral lateral recess stenosis. No evidence of neural foraminal stenosis.  L5-S1: Mild broad-based disc bulge. Moderate bilateral facet arthropathy. No evidence of neural foraminal stenosis. No central canal stenosis.  IMPRESSION: 1. Lumbar spine spondylosis most severe at L4-5. 2. At L4-5 there is a moderate broad-based disc bulge with a posterior annular fissure. Severe bilateral facet arthropathy with ligamentum flavum infolding resulting in severe spinal stenosis and bilateral lateral recess stenosis.   Electronically Signed   By: Kathreen Devoid   On: 10/31/2014 16:53     Objective:  VS:  HT:    WT:   BMI:     BP:120/63  HR:79bpm  TEMP: ( )  RESP:  Physical Exam Constitutional:      General: She is not in acute distress.    Appearance: Normal appearance. She is not ill-appearing.  HENT:     Head: Normocephalic and atraumatic.     Right Ear: External ear normal.     Left Ear: External ear normal.  Eyes:     Extraocular Movements: Extraocular movements intact.  Cardiovascular:     Rate and Rhythm: Normal rate.     Pulses: Normal pulses.  Musculoskeletal:     Right lower leg: No edema.     Left lower leg: No edema.     Comments: Patient has good distal strength with no pain over the greater trochanters.  No clonus or focal weakness.  Exam shows concordant low back pain with facet joint loading and extension.  Skin:    Findings: No erythema, lesion or rash.  Neurological:     General: No focal deficit  present.     Mental Status: She is alert and oriented to person, place, and time.     Sensory: No sensory deficit.     Motor: No weakness or abnormal muscle tone.     Coordination: Coordination normal.  Psychiatric:        Mood and Affect: Mood normal.        Behavior: Behavior normal.      Imaging: XR C-ARM NO REPORT  Result Date: 09/22/2019 Please see Notes tab for imaging impression.

## 2019-09-23 NOTE — Procedures (Signed)
Lumbar Facet Joint Intra-Articular Injection(s) with Fluoroscopic Guidance  Patient: Priscilla [name]      Date of Birth: [birth year] MRN: [id number] PCP: Ma Hillock, DO      Visit Date: 09/22/2019   Universal Protocol:    Date/Time: 09/22/2019  Consent Given By: the patient  Position: PRONE   Additional Comments: Vital signs were monitored before and after the procedure. Patient was prepped and draped in the usual sterile fashion. The correct patient, procedure, and site was verified.   Injection Procedure Details:  Procedure Site One Meds Administered:  Meds ordered this encounter  Medications  . methylPREDNISolone acetate (DEPO-MEDROL) injection 80 mg     Laterality: Bilateral  Location/Site:  L4-L5  Needle size: 22 guage  Needle type: Spinal  Needle Placement: Articular  Findings:  -Comments: Excellent flow of contrast producing a partial arthrogram.  Procedure Details: The fluoroscope beam is vertically oriented in AP, and the inferior recess is visualized beneath the lower pole of the inferior apophyseal process, which represents the target point for needle insertion. When direct visualization is difficult the target point is located at the medial projection of the vertebral pedicle. The region overlying each aforementioned target is locally anesthetized with a 1 to 2 ml. volume of 1% Lidocaine without Epinephrine.   The spinal needle was inserted into each of the above mentioned facet joints using biplanar fluoroscopic guidance. A 0.25 to 0.5 ml. volume of Isovue-250 was injected and a partial facet joint arthrogram was obtained. A single spot film was obtained of the resulting arthrogram.    One to 1.25 ml of the steroid/anesthetic solution was then injected into each of the facet joints noted above.   Additional Comments:  The patient tolerated the procedure well Dressing: 2 x 2 sterile gauze and Band-Aid    Post-procedure details: Patient was  observed during the procedure. Post-procedure instructions were reviewed.  Patient left the clinic in stable condition.

## 2019-09-23 NOTE — Progress Notes (Signed)
Patient Care Team    Relationship Specialty Notifications Start End  Ma Hillock, DO PCP - General Family Medicine  07/19/15    Comment: transfer to [place], Tonna Corner, MD Consulting Physician Orthopedic Surgery  08/06/15   Philemon Kingdom, MD Consulting Physician Internal Medicine  08/06/15    Comment: endocrine  Mcarthur Rossetti, MD Consulting Physician Orthopedic Surgery  08/06/15   Bo Merino, MD Consulting Physician Rheumatology  08/06/15   Volanda Napoleon, MD Consulting Physician Oncology  11/10/16   Magnus Sinning, MD Consulting Physician Physical Medicine and Rehabilitation  11/10/16   Dermatology, [facility]    11/10/16    Comment: Dr. Martinique  Hyatt, Romilda Garret, DPM Consulting Physician Podiatry  12/03/18      SUBJECTIVE Chief Complaint  Patient presents with  . Hypertension    Needs refills   . Depression  . Anxiety    HPI: Priscilla [name] is a 69 y.o. female present for cmc Hypertension/hyperlipidemia: Pt reports  compliance with metoprolol BID and lisinopril 2.5 mg daily. Blood pressures ranges at home are not checked. Patient denies chest pain, shortness of breath, dizziness or lower extremity edema.  Pt takes a daily baby ASA. Pt is taking pravastatin. Labs UTD Diet: low sodium.  Exercise: routinely.  RF: HTN, HLD, DM, FHx HD  Peripheral neuropathy/anxiety: Patient reports the Cymbalta 60 mg a day was helpful with her neuropathy.  Seems to be some confusion surrounding her Cymbalta prescription through her pharmacy.  I have been writing her Cymbalta for a total dose of 90 mg for quite some time, however a refill was sent to her rheumatologist accidentally.  Patient is tearful today discussing events surrounding the rheumatology appointment and her increased anxiety.  She now has been taking Cymbalta 60 mg only for the last 2 weeks.  This dose does help with her rheumatological conditions, but her anxiety has increased not only because she has been  without the 30 mg addition (90 mg total), but secondary to memory changes that are occurring with her husband.  ROS: See pertinent positives and negatives per HPI.  Depression screen [facility] 2/9 09/23/2019 03/14/2019 12/03/2018 08/13/2018 02/11/2018  Decreased Interest 0 0 0 0 0  Down, Depressed, Hopeless 3 0 0 0 1  PHQ - 2 Score 3 0 0 0 1  Altered sleeping 3 0 - 0 0  Tired, decreased energy 3 1 - 1 1  Change in appetite 0 0 - 0 0  Feeling bad or failure about yourself  0 0 - 0 0  Trouble concentrating 0 0 - 0 0  Moving slowly or fidgety/restless 0 0 - 0 0  Suicidal thoughts 0 0 - 0 0  PHQ-9 Score 9 1 - 1 2  Difficult doing work/chores Somewhat difficult Not difficult at all - Not difficult at all Not difficult at all  Some recent data might be hidden   GAD 7 : Generalized Anxiety Score 09/23/2019 03/14/2019 08/13/2018 02/11/2018  Nervous, Anxious, on Edge 3 0 2 1  Control/stop worrying 3 0 0 1  Worry too much - different things 3 0 3 1  Trouble relaxing 0 0 0 0  Restless 0 0 0 0  Easily annoyed or irritable 0 0 0 1  Afraid - awful might happen 0 0 0 1  Total GAD 7 Score 9 0 5 5  Anxiety Difficulty Not difficult at all Not difficult at all Not difficult at all Somewhat difficult    Patient Active  Problem List   Diagnosis Date Noted  . Type 2 diabetes mellitus with retinopathy, with long-term current use of insulin ([place]) 05/17/2018  . Overweight (BMI 25.0-29.9) 02/11/2018  . Vitamin D deficiency 10/13/2016  . 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 Iron deficiency anemia 04/28/2016  . Other insomnia 03/14/2016  . DDD (degenerative disc disease), cervical 03/14/2016  . Spondylosis without myelopathy or radiculopathy, lumbar region 01/31/2016  . Medicare annual wellness visit, initial 08/06/2015  . Anxiety and depression 06/25/2014  . Psoriatic arthritis - sees rheumatologist 02/20/2014  . Asthma 12/14/2008  . Diabetic peripheral neuropathy  associated with type 2 diabetes mellitus ([place]) 08/06/2008  . Hyperlipemia 11/12/2006  . Essential hypertension 11/12/2006    Social History   Tobacco Use  . Smoking status: Never Smoker  . Smokeless tobacco: Never Used  Substance Use Topics  . Alcohol use: Yes    Comment: glass of wine-special occasion    Current Outpatient Medications:  .  albuterol (PROVENTIL HFA;VENTOLIN HFA) 108 (90 Base) MCG/ACT inhaler, Inhale 2 puffs into the lungs every 6 (six) hours as needed for wheezing or shortness of breath., Disp: 1 Inhaler, Rfl: 1 .  aspirin 81 MG tablet, Take 81 mg by mouth daily.  , Disp: , Rfl:  .  Biotin 5000 MCG TABS, Take 5,000 mcg by mouth daily. , Disp: , Rfl:  .  calcium-vitamin D 250-100 MG-UNIT per tablet, Take 1 tablet by mouth daily. , Disp: , Rfl:  .  Cyanocobalamin (VITAMIN B12 PO), Place under the tongue., Disp: , Rfl:  .  DULoxetine (CYMBALTA) 60 MG capsule, Take 1 capsule (60 mg total) by mouth daily. Take with 30 mg to equal 90 mg daily., Disp: 90 capsule, Rfl: 1 .  insulin aspart (NOVOLOG FLEXPEN) 100 UNIT/ML FlexPen, INJECT 6 TO 8 UNITS UNDER THE SKIN THREE TIMES DAILY WITH MEALS, PLUS SLIDING SCALE (Patient taking differently: INJECT 2 TO 4 UNITS UNDER THE SKIN THREE TIMES DAILY WITH MEALS, PLUS SLIDING SCALE), Disp: 15 mL, Rfl: 2 .  insulin detemir (LEVEMIR) 100 UNIT/ML injection, Inject 0.15 mLs (15 Units total) into the skin at bedtime., Disp: 30 mL, Rfl: 3 .  Insulin Syringe-Needle U-100 (B-D INS SYR MICROFINE 1CC/27G) 27G X 5/8" 1 ML MISC, Use to inject levemir once daily DX: E11.319, Disp: 120 each, Rfl: 6 .  lisinopril (ZESTRIL) 2.5 MG tablet, Take 1 tablet (2.5 mg total) by mouth daily., Disp: 30 tablet, Rfl: 5 .  metFORMIN (GLUCOPHAGE) 1000 MG tablet, TAKE 1 TABLET BY MOUTH 2 TIMES DAILY WITH MEALS, Disp: 180 tablet, Rfl: 1 .  metoprolol tartrate (LOPRESSOR) 25 MG tablet, Take 1 tablet (25 mg total) by mouth 2 (two) times daily., Disp: 60 tablet, Rfl: 5 .   Multiple Vitamin (MULTIVITAMIN) tablet, Take 1 tablet by mouth daily. , Disp: , Rfl:  .  pravastatin (PRAVACHOL) 20 MG tablet, Take 1 tablet (20 mg total) by mouth daily., Disp: 30 tablet, Rfl: 11 .  terbinafine (LAMISIL) 250 MG tablet, Take 1 tablet (250 mg total) by mouth daily., Disp: 30 tablet, Rfl: 0 .  Vitamin D, Ergocalciferol, (DRISDOL) 1.25 MG (50000 UT) CAPS capsule, TAKE 1 CAPSULE BY MOUTH every 7 DAYS, Disp: 15 capsule, Rfl: 3 .  zolpidem (AMBIEN) 5 MG tablet, Take one tablet by mouth at bedtime as needed for insomnia, Disp: 30 tablet, Rfl: 0 .  glucose blood (ONETOUCH VERIO) test strip, USE TO TEST BLOOD SUGAR TWICE A DAY DX: E11.319 (  Patient not taking: Reported on 09/23/2019), Disp: 200 each, Rfl: 0 .  sertraline (ZOLOFT) 50 MG tablet, Take 1 tablet (50 mg total) by mouth daily., Disp: 30 tablet, Rfl: 5  No Known Allergies  OBJECTIVE: BP 109/73 (BP Location: Right Arm, Patient Position: Sitting, Cuff Size: Normal)   Pulse 68   Temp 97.7 F (36.5 C) (Temporal)   Resp 17   Ht 5\' 4"  (1.626 m)   Wt 161 lb 6 oz (73.2 kg)   LMP  (LMP Unknown)   SpO2 98%   BMI 27.70 kg/m  Gen: Afebrile. No acute distress. Nontoxic. pleasant female.  HENT: AT. [place]. No cough or hoarseness.  Eyes:Pupils Equal Round Reactive to light, Extraocular movements intact,  Conjunctiva without redness, discharge or icterus. Neck/lymp/endocrine: Supple,no lymphadenopathy, no thyromegaly CV: RRR no murmur, no edema Chest: CTAB, no wheeze or crackles Neuro:  Normal gait. PERLA. EOMi. Alert. Oriented x3  Psych: Normal affect, dress and demeanor. Normal speech. Normal thought content and judgment.   ASSESSMENT AND PLAN: YAILENE BADIA is a 69 y.o. female present for  Essential hypertension/palpitations/HLD/overweight - stable.  - continue metoprolol - continue lisinopril.  - continue pravastatin -Lipid panel, TSH and T4 UTD 03/2019 - low sodium. Routine exercise.  - con't baby ASA - monitor for  elevated pressures > 140/90 and return to clinic if routinely above goal.  - f/u 6 months.   Vitamin D deficiency: Vitamin D levels 03/2019> normal.   Anxiety and depression/neuropathy Worsening anxiety.  Lengthy discussion today surrounding her Cymbalta dosing.  As well as, the increased stress and worry surrounding her husband's health. She will continue the Cymbalta 60 mg daily-supplied by her rheumatology office >for her rheumatological conditions.  I will be happy to take this prescription over to avoid any further confusion, if necessary, in the future. Discontinued the Cymbalta 30 mg.  She has not picked this up as of yet, and she will not be picking this up. To help her with depression anxiety coverage she will start Zoloft 25-50 mg.  Tapering instructions provided to her. -Follow-up 5.5 months-sooner if depression anxiety is not adequately controlled.  type 2 diabetes mellitus with retinopathy, with long-term current use of insulin ([place]) Managed by endocrinology, last A1c looked good last visit and they are working together for hypoglycemic events.   No orders of the defined types were placed in this encounter.  Meds ordered this encounter  Medications  . lisinopril (ZESTRIL) 2.5 MG tablet    Sig: Take 1 tablet (2.5 mg total) by mouth daily.    Dispense:  30 tablet    Refill:  5  . metoprolol tartrate (LOPRESSOR) 25 MG tablet    Sig: Take 1 tablet (25 mg total) by mouth 2 (two) times daily.    Dispense:  60 tablet    Refill:  5  . pravastatin (PRAVACHOL) 20 MG tablet    Sig: Take 1 tablet (20 mg total) by mouth daily.    Dispense:  30 tablet    Refill:  11  . sertraline (ZOLOFT) 50 MG tablet    Sig: Take 1 tablet (50 mg total) by mouth daily.    Dispense:  30 tablet    Refill:  5   Referral Orders  No referral(s) requested today      Priscilla Pouch, DO 09/23/2019

## 2019-09-23 NOTE — Patient Instructions (Signed)
Continue Cymbalta 60 mg (now prescribed by your rheumatologist). Since there seems to be some confusion on the cymbalta- do not restart the extra 30 mg a day.  Start zoloft 25 mg (which is 1/2 tab) for 7 days, then increase to full tab.   BP looks good. I have refilled your medications.  If you need anything do not hesitate to call.

## 2019-09-29 ENCOUNTER — Other Ambulatory Visit: Payer: Self-pay | Admitting: Family Medicine

## 2019-09-29 DIAGNOSIS — I10 Essential (primary) hypertension: Secondary | ICD-10-CM

## 2019-10-14 ENCOUNTER — Ambulatory Visit: Payer: Medicare Other | Admitting: Podiatry

## 2019-10-14 ENCOUNTER — Other Ambulatory Visit: Payer: Self-pay | Admitting: *Deleted

## 2019-10-14 MED ORDER — ZOLPIDEM TARTRATE 5 MG PO TABS: ORAL_TABLET | ORAL | 0 refills | Status: DC

## 2019-10-14 NOTE — Telephone Encounter (Signed)
Refill request received via fax  Last visit: 09/03/2019 Next visit: 02/25/2020  Last Fill: 09/12/2019  Okay to refill Ambien?

## 2019-10-16 ENCOUNTER — Telehealth (INDEPENDENT_AMBULATORY_CARE_PROVIDER_SITE_OTHER): Payer: Medicare Other | Admitting: Family Medicine

## 2019-10-16 ENCOUNTER — Encounter: Payer: Self-pay | Admitting: Family Medicine

## 2019-10-16 DIAGNOSIS — J9801 Acute bronchospasm: Secondary | ICD-10-CM | POA: Diagnosis not present

## 2019-10-16 DIAGNOSIS — R0600 Dyspnea, unspecified: Secondary | ICD-10-CM | POA: Diagnosis not present

## 2019-10-16 DIAGNOSIS — R05 Cough: Secondary | ICD-10-CM

## 2019-10-16 DIAGNOSIS — Z20822 Contact with and (suspected) exposure to covid-19: Secondary | ICD-10-CM | POA: Diagnosis not present

## 2019-10-16 DIAGNOSIS — R509 Fever, unspecified: Secondary | ICD-10-CM | POA: Diagnosis not present

## 2019-10-16 DIAGNOSIS — Z794 Long term (current) use of insulin: Secondary | ICD-10-CM

## 2019-10-16 DIAGNOSIS — E11319 Type 2 diabetes mellitus with unspecified diabetic retinopathy without macular edema: Secondary | ICD-10-CM | POA: Diagnosis not present

## 2019-10-16 DIAGNOSIS — R059 Cough, unspecified: Secondary | ICD-10-CM

## 2019-10-16 DIAGNOSIS — J45901 Unspecified asthma with (acute) exacerbation: Secondary | ICD-10-CM

## 2019-10-16 DIAGNOSIS — J209 Acute bronchitis, unspecified: Secondary | ICD-10-CM | POA: Diagnosis not present

## 2019-10-16 MED ORDER — PREDNISONE 20 MG PO TABS: 40.0000 mg | ORAL_TABLET | Freq: Every day | ORAL | 0 refills | Status: DC

## 2019-10-16 MED ORDER — DOXYCYCLINE HYCLATE 100 MG PO TABS: 100.0000 mg | ORAL_TABLET | Freq: Two times a day (BID) | ORAL | 0 refills | Status: DC

## 2019-10-16 NOTE — Progress Notes (Signed)
Virtual Visit via Video Note  I connected with Rai  on 10/16/19 at 11:20 AM EDT by a video enabled telemedicine application and verified that I am speaking with the correct person using two identifiers.  Location patient: home, [place] Location provider:work or home office Persons participating in the virtual visit: patient, provider, husband  I discussed the limitations of evaluation and management by telemedicine and the availability of in person appointments. The patient expressed understanding and agreed to proceed.   HPI:  Acute visit for a cough: -this started about 7 days ago after going to [place] with family -multiple family members with similar symptoms -symptoms include cough, nasal congestion, sweating, malaise, some body aches, NVD -O2 sats have been around 93 - usually is 95 or over for her -has a fever today 100.6 -feeling a little worse today with HA and fever and mild SOB and increase mucus production -she used her albuterol which resolved the SOB -daughter and granddaughter with similar symptoms and they had negative covid tests -denies NVD, SOB not responding to albuterol, inability to get out of bed -patient is fully vaccinated for covid19  with moderna -reports has required prednisone in the past with resp illness, but prefers to avoid if possible due to BS issues when she takes it   ROS: See pertinent positives and negatives per HPI.  Past Medical History:  Diagnosis Date  . Anemia 12/14/2008  . Anxiety and depression 12/14/2008  . Asthma 11/12/2006  . CAD (coronary artery disease) 12/14/2008   hx transluminal coronary angioplasty  . Cataract   . Depression   . Diabetes mellitus with neuropathy ([place]) 12/14/2008  . Fibromyalgia 01/04/2007   sees rheuamtology  . 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 Hyperlipemia 01/21/2010  . Hypertension 12/14/2008  . Leg edema 12/14/2008  . 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 OSA (obstructive sleep apnea) 08/06/2008  .  Osteoarthritis 12/14/2008  . Palpitations 11/12/2006   hx sinus tachy  . Psoriatic arthritis [facility])    sees rheumatology  . Rotator cuff injury     Past Surgical History:  Procedure Laterality Date  . ABDOMINAL HYSTERECTOMY    . CHOLECYSTECTOMY    . LAPAROSCOPIC GASTRIC BANDING    . PTCA    . WRIST SURGERY     LEFT    Family History  Problem Relation Age of Onset  . Arthritis Mother   . Alzheimer's disease Mother   . Stroke Father   . Alcohol abuse Father   . Diabetes Father   . Heart disease Father   . Early death Father 69  . Diabetes Sister   . Arthritis Sister   . COPD Sister   . Arthritis Maternal Aunt   . Diabetes Maternal Aunt   . Early death Maternal Aunt 23  . Diabetes Maternal Uncle   . Cancer Paternal Aunt   . COPD Paternal 59   . Heart disease Paternal Aunt   . Heart disease Paternal Uncle   . Alcohol abuse Paternal Grandfather   . Heart disease Paternal Grandfather   . Early death Paternal Grandfather 8  . Stroke Paternal Grandfather   . Hypertension Son   . Autoimmune disease Daughter   . Breast cancer Neg Hx     SOCIAL HX: see hpi   Current Outpatient Medications:  .  albuterol (PROVENTIL HFA;VENTOLIN HFA) 108 (90 Base) MCG/ACT inhaler, Inhale 2 puffs into the lungs every 6 (six) hours as needed for wheezing or shortness of breath., Disp: 1 Inhaler,  Rfl: 1 .  aspirin 81 MG tablet, Take 81 mg by mouth daily.  , Disp: , Rfl:  .  Biotin 5000 MCG TABS, Take 5,000 mcg by mouth daily. , Disp: , Rfl:  .  calcium-vitamin D 250-100 MG-UNIT per tablet, Take 1 tablet by mouth daily. , Disp: , Rfl:  .  Cyanocobalamin (VITAMIN B12 PO), Place under the tongue., Disp: , Rfl:  .  DULoxetine (CYMBALTA) 60 MG capsule, Take 1 capsule (60 mg total) by mouth daily. Take with 30 mg to equal 90 mg daily., Disp: 90 capsule, Rfl: 1 .  glucose blood (ONETOUCH VERIO) test strip, USE TO TEST BLOOD SUGAR TWICE A DAY DX: E11.319, Disp: 200 each, Rfl: 0 .  insulin aspart  (NOVOLOG FLEXPEN) 100 UNIT/ML FlexPen, INJECT 6 TO 8 UNITS UNDER THE SKIN THREE TIMES DAILY WITH MEALS, PLUS SLIDING SCALE (Patient taking differently: INJECT 2 TO 4 UNITS UNDER THE SKIN THREE TIMES DAILY WITH MEALS, PLUS SLIDING SCALE), Disp: 15 mL, Rfl: 2 .  insulin detemir (LEVEMIR) 100 UNIT/ML injection, Inject 0.15 mLs (15 Units total) into the skin at bedtime., Disp: 30 mL, Rfl: 3 .  Insulin Syringe-Needle U-100 (B-D INS SYR MICROFINE 1CC/27G) 27G X 5/8" 1 ML MISC, Use to inject levemir once daily DX: E11.319, Disp: 120 each, Rfl: 6 .  lisinopril (ZESTRIL) 2.5 MG tablet, Take 1 tablet (2.5 mg total) by mouth daily., Disp: 30 tablet, Rfl: 5 .  metFORMIN (GLUCOPHAGE) 1000 MG tablet, TAKE 1 TABLET BY MOUTH 2 TIMES DAILY WITH MEALS, Disp: 180 tablet, Rfl: 1 .  metoprolol tartrate (LOPRESSOR) 25 MG tablet, Take 1 tablet (25 mg total) by mouth 2 (two) times daily., Disp: 60 tablet, Rfl: 5 .  Multiple Vitamin (MULTIVITAMIN) tablet, Take 1 tablet by mouth daily. , Disp: , Rfl:  .  pravastatin (PRAVACHOL) 20 MG tablet, Take 1 tablet (20 mg total) by mouth daily., Disp: 30 tablet, Rfl: 11 .  sertraline (ZOLOFT) 50 MG tablet, Take 1 tablet (50 mg total) by mouth daily., Disp: 30 tablet, Rfl: 5 .  Vitamin D, Ergocalciferol, (DRISDOL) 1.25 MG (50000 UT) CAPS capsule, TAKE 1 CAPSULE BY MOUTH every 7 DAYS, Disp: 15 capsule, Rfl: 3 .  zolpidem (AMBIEN) 5 MG tablet, Take one tablet by mouth at bedtime as needed for insomnia, Disp: 30 tablet, Rfl: 0 .  doxycycline (VIBRA-TABS) 100 MG tablet, Take 1 tablet (100 mg total) by mouth 2 (two) times daily., Disp: 14 tablet, Rfl: 0 .  predniSONE (DELTASONE) 20 MG tablet, Take 2 tablets (40 mg total) by mouth daily with breakfast., Disp: 8 tablet, Rfl: 0  EXAM:  VITALS per patient if applicable: temp 161.0, O2 93-94  GENERAL: alert, oriented, appears well and in no acute distress  HEENT: atraumatic, conjunttiva clear, no obvious abnormalities on inspection of  external nose and ears  NECK: normal movements of the head and neck  LUNGS: on inspection no signs of respiratory distress, breathing rate appears normal, no obvious gross SOB, gasping or wheezing  CV: no obvious cyanosis  MS: moves all visible extremities without noticeable abnormality  PSYCH/NEURO: pleasant and cooperative, no obvious depression or anxiety, speech and thought processing grossly intact  ASSESSMENT AND PLAN:  Discussed the following assessment and plan:  Cough  Dyspnea, unspecified type  Fever, unspecified fever cause  Type 2 diabetes mellitus with retinopathy, with long-term current use of insulin, macular edema presence unspecified, unspecified laterality, unspecified retinopathy severity ([place])  Asthma with acute exacerbation, unspecified asthma severity, unspecified  whether persistent  -we discussed possible serious and likely etiologies, options for evaluation and workup, limitations of telemedicine visit vs in person visit, treatment, treatment risks and precautions. Pt prefers to treat via telemedicine empirically rather then risking or undertaking an in person visit at this moment. Give duration of symptoms and worsening, advised inperson care or treatment with abx and asthma tx. She prefers to try treatment with doxy 100mg  bidx7 days, alb prn and prednisone if alb does not resolve symptoms or is requiring an increased frequency. She agrees to monitor BS and contact PCP or schedule telemedicine visit if high BS. Discussed limitations and risks of COVID19 testing and options for testing. Husband is worried about breakthrough case. Advised less likely given fully vaccinated and several other family members tested negative. Advised of testing options, treatment, isolation when sick, precautions in case posistive. Patient agrees to seek prompt in person care if worsening, new symptoms arise, or if is not improving with treatment.   I discussed the assessment and  treatment plan with the patient. The patient was provided an opportunity to ask questions and all were answered. The patient agreed with the plan and demonstrated an understanding of the instructions.   The patient was advised to call back or seek an in-person evaluation if the symptoms worsen or if the condition fails to improve as anticipated.   Lucretia Kern, DO

## 2019-11-08 ENCOUNTER — Other Ambulatory Visit: Payer: Self-pay | Admitting: Family Medicine

## 2019-11-08 DIAGNOSIS — I10 Essential (primary) hypertension: Secondary | ICD-10-CM

## 2019-11-10 ENCOUNTER — Other Ambulatory Visit: Payer: Self-pay | Admitting: Family

## 2019-11-10 DIAGNOSIS — E559 Vitamin D deficiency, unspecified: Secondary | ICD-10-CM

## 2019-11-11 ENCOUNTER — Telehealth: Payer: Self-pay | Admitting: Physical Medicine and Rehabilitation

## 2019-11-11 NOTE — Telephone Encounter (Signed)
Patient called requesting a call back for right hip pain appt. Please call patient concerning this matter. Patient phone number is [phone].

## 2019-11-12 NOTE — Telephone Encounter (Signed)
Ok or could try SI joint if not beneficial

## 2019-11-12 NOTE — Telephone Encounter (Signed)
Patient states that pain is different than what she has felt before. Scheduled for OV.

## 2019-11-12 NOTE — Telephone Encounter (Signed)
History of bilateral L4-5 facet injections. Last was 09/22/19. Please advise.

## 2019-11-13 ENCOUNTER — Other Ambulatory Visit: Payer: Self-pay | Admitting: Rheumatology

## 2019-11-13 NOTE — Telephone Encounter (Signed)
Last visit: 09/03/2019 Next visit: 02/25/2020  Last Fill: 10/14/2019  Okay to refill Ambien?

## 2019-11-18 ENCOUNTER — Other Ambulatory Visit: Payer: Self-pay | Admitting: Internal Medicine

## 2019-11-19 ENCOUNTER — Inpatient Hospital Stay: Payer: Medicare Other | Attending: Hematology & Oncology

## 2019-11-19 ENCOUNTER — Other Ambulatory Visit: Payer: Self-pay

## 2019-11-19 ENCOUNTER — Inpatient Hospital Stay (HOSPITAL_BASED_OUTPATIENT_CLINIC_OR_DEPARTMENT_OTHER): Payer: Medicare Other | Admitting: Family

## 2019-11-19 ENCOUNTER — Encounter: Payer: Self-pay | Admitting: Family

## 2019-11-19 VITALS — BP 111/57 | HR 65 | Temp 98.4°F | Resp 18 | Ht 64.0 in | Wt 157.4 lb

## 2019-11-19 DIAGNOSIS — G629 Polyneuropathy, unspecified: Secondary | ICD-10-CM | POA: Diagnosis not present

## 2019-11-19 DIAGNOSIS — D508 Other iron deficiency anemias: Secondary | ICD-10-CM

## 2019-11-19 DIAGNOSIS — Z7952 Long term (current) use of systemic steroids: Secondary | ICD-10-CM | POA: Diagnosis not present

## 2019-11-19 DIAGNOSIS — Z79899 Other long term (current) drug therapy: Secondary | ICD-10-CM | POA: Diagnosis not present

## 2019-11-19 DIAGNOSIS — K909 Intestinal malabsorption, unspecified: Secondary | ICD-10-CM

## 2019-11-19 DIAGNOSIS — Z7982 Long term (current) use of aspirin: Secondary | ICD-10-CM | POA: Diagnosis not present

## 2019-11-19 LAB — RETICULOCYTES
Immature Retic Fract: 6.9 % (ref 2.3–15.9)
RBC.: 4.49 MIL/uL (ref 3.87–5.11)
Retic Count, Absolute: 64.7 10*3/uL (ref 19.0–186.0)
Retic Ct Pct: 1.4 % (ref 0.4–3.1)

## 2019-11-19 LAB — CBC WITH DIFFERENTIAL (CANCER CENTER ONLY)
Abs Immature Granulocytes: 0.1 10*3/uL — ABNORMAL HIGH (ref 0.00–0.07)
Basophils Absolute: 0.1 10*3/uL (ref 0.0–0.1)
Basophils Relative: 1 %
Eosinophils Absolute: 0.4 10*3/uL (ref 0.0–0.5)
Eosinophils Relative: 6 %
HCT: 40.6 % (ref 36.0–46.0)
Hemoglobin: 13 g/dL (ref 12.0–15.0)
Immature Granulocytes: 1 %
Lymphocytes Relative: 26 %
Lymphs Abs: 2 10*3/uL (ref 0.7–4.0)
MCH: 29.1 pg (ref 26.0–34.0)
MCHC: 32 g/dL (ref 30.0–36.0)
MCV: 90.8 fL (ref 80.0–100.0)
Monocytes Absolute: 0.7 10*3/uL (ref 0.1–1.0)
Monocytes Relative: 9 %
Neutro Abs: 4.3 10*3/uL (ref 1.7–7.7)
Neutrophils Relative %: 57 %
Platelet Count: 336 10*3/uL (ref 150–400)
RBC: 4.47 MIL/uL (ref 3.87–5.11)
RDW: 13.1 % (ref 11.5–15.5)
WBC Count: 7.6 10*3/uL (ref 4.0–10.5)
nRBC: 0 % (ref 0.0–0.2)

## 2019-11-19 LAB — CMP (CANCER CENTER ONLY)
ALT: 12 U/L (ref 0–44)
AST: 13 U/L — ABNORMAL LOW (ref 15–41)
Albumin: 4.3 g/dL (ref 3.5–5.0)
Alkaline Phosphatase: 47 U/L (ref 38–126)
Anion gap: 7 (ref 5–15)
BUN: 20 mg/dL (ref 8–23)
CO2: 30 mmol/L (ref 22–32)
Calcium: 9.6 mg/dL (ref 8.9–10.3)
Chloride: 103 mmol/L (ref 98–111)
Creatinine: 0.84 mg/dL (ref 0.44–1.00)
GFR, Est AFR Am: >60 mL/min (ref >60)
GFR, Estimated: >60 mL/min (ref >60)
Glucose, Bld: 160 mg/dL — ABNORMAL HIGH (ref 70–99)
Potassium: 4.9 mmol/L (ref 3.5–5.1)
Sodium: 140 mmol/L (ref 135–145)
Total Bilirubin: 0.3 mg/dL (ref 0.3–1.2)
Total Protein: 6.5 g/dL (ref 6.5–8.1)

## 2019-11-19 NOTE — Progress Notes (Signed)
Hematology and Oncology Follow Up Visit  Priscilla [name] [id number] 1950-05-27 69 y.o. 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 She is doing well but states that she still notes fatigue around 2 pm in the afternoon each day. She is staying busy with her sweet family and baby sitting her grandson.  She has not noted any blood loss. No bruising or petechiae.  No fever, chills, n/v, cough, rash, dizziness, SOB, chest pain, palpitations, abdominal pain or changes in bowel or bladder habits.  No tenderness in her extremities at this time.  She has puffiness in her feet at the end of the day but this disappears once she propr up her feet.  The neuropathy in her hands and feet is described as stable.  No falls or syncopal episodes to report.  Her blood sugars are mush more controlled with her CGM device.  She has a good appetite and is staying well hydrated. Her weight is stable.   ECOG Performance Status: 1 - Symptomatic but completely ambulatory  Medications:  Allergies as of 11/19/2019   No Known Allergies     Medication List       Accurate as of November 19, 2019  2:57 PM. If you have any questions, ask your nurse or doctor.        albuterol 108 (90 Base) MCG/ACT inhaler Commonly known as: VENTOLIN HFA Inhale 2 puffs into the lungs every 6 (six) hours as needed for wheezing or shortness of breath.   aspirin 81 MG tablet Take 81 mg by mouth daily.   Biotin 5000 MCG Tabs Take 5,000 mcg by mouth daily.   calcium-vitamin D 250-100 MG-UNIT tablet Take 1 tablet by mouth daily.   doxycycline 100 MG tablet Commonly known as: VIBRA-TABS Take 1 tablet (100 mg total) by mouth 2 (two) times daily.   DULoxetine 60 MG capsule Commonly known as: CYMBALTA Take 1 capsule (60 mg total) by  mouth daily. Take with 30 mg to equal 90 mg daily.   glucose blood test strip Commonly known as: OneTouch Verio USE TO TEST BLOOD SUGAR TWICE A DAY DX: E11.319   insulin detemir 100 UNIT/ML injection Commonly known as: Levemir Inject 0.15 mLs (15 Units total) into the skin at bedtime.   Insulin Syringe-Needle U-100 27G X 5/8" 1 ML Misc Commonly known as: B-D INS SYR MICROFINE 1CC/27G Use to inject levemir once daily DX: E11.319   lisinopril 2.5 MG tablet Commonly known as: ZESTRIL Take 1 tablet (2.5 mg total) by mouth daily.   metFORMIN 1000 MG tablet Commonly known as: GLUCOPHAGE TAKE 1 TABLET BY MOUTH TWICE DAILY WITH MEALS   metoprolol tartrate 25 MG tablet Commonly known as: LOPRESSOR Take 1 tablet (25 mg total) by mouth 2 (two) times daily.   multivitamin tablet Take 1 tablet by mouth daily.   NovoLOG FlexPen 100 UNIT/ML FlexPen Generic drug: insulin aspart INJECT 6 TO 8 UNITS UNDER THE SKIN THREE TIMES DAILY WITH MEALS, PLUS SLIDING SCALE What changed: additional instructions   pravastatin 20 MG tablet Commonly known as: PRAVACHOL Take 1 tablet (20 mg total) by mouth daily.   predniSONE 20 MG tablet Commonly known as: DELTASONE Take 2 tablets (40 mg total) by mouth daily with  breakfast.   sertraline 50 MG tablet Commonly known as: ZOLOFT Take 1 tablet (50 mg total) by mouth daily.   VITAMIN B12 PO Place under the tongue.   Vitamin D (Ergocalciferol) 1.25 MG (50000 UNIT) Caps capsule Commonly known as: DRISDOL TAKE 1 CAPSULE BY MOUTH EVERY 7 DAYS   zolpidem 5 MG tablet Commonly known as: AMBIEN TAKE 1 TABLET BY MOUTH AT BEDTIME AS NEEDED FOR INSOMNIA       Allergies: No Known Allergies  Past Medical History, Surgical history, Social history, and Family History were reviewed and updated.  Review of Systems: All other 10 point review of systems is negative.   Physical Exam:  vitals were not taken for this visit.   Wt Readings from Last 3  Encounters:  09/23/19 161 lb 6 oz (73.2 kg)  09/03/19 163 lb 12.8 oz (74.3 kg)  08/25/19 164 lb (74.4 kg)    Ocular: Sclerae unicteric, pupils equal, round and reactive to light Ear-nose-throat: Oropharynx clear, dentition fair Lymphatic: No cervical or supraclavicular adenopathy Lungs no rales or rhonchi, good excursion bilaterally Heart regular rate and rhythm, no murmur appreciated Abd soft, nontender, positive bowel sounds, no liver or spleen tip palpated on exam, no fluid wave  MSK no focal spinal tenderness, no joint edema Neuro: non-focal, well-oriented, appropriate affect Breasts: Deferred   Lab Results  Component Value Date   WBC 7.6 11/19/2019   HGB 13.0 11/19/2019   HCT 40.6 11/19/2019   MCV 90.8 11/19/2019   PLT 336 11/19/2019   Lab Results  Component Value Date   FERRITIN 96 05/22/2019   IRON 83 05/22/2019   TIBC 276 05/22/2019   UIBC 193 05/22/2019   IRONPCTSAT 30 05/22/2019   Lab Results  Component Value Date   RETICCTPCT 1.4 11/19/2019   RBC 4.49 11/19/2019   No results found for: KPAFRELGTCHN, LAMBDASER, KAPLAMBRATIO No results found for: Kandis Cocking, IGMSERUM No results found for: Odetta Pink, SPEI   Chemistry      Component Value Date/Time   NA 139 03/19/2019 0936   NA 141 10/13/2016 1503   K 4.7 03/19/2019 0936   K 4.9 10/13/2016 1503   CL 102 03/19/2019 0936   CL 103 10/13/2016 1503   CO2 32 03/19/2019 0936   CO2 28 10/13/2016 1503   BUN 15 03/19/2019 0936   BUN 16 10/13/2016 1503   CREATININE 0.77 03/19/2019 0936   CREATININE 0.90 10/13/2016 1503      Component Value Date/Time   CALCIUM 9.6 03/19/2019 0936   CALCIUM 9.5 10/13/2016 1503   ALKPHOS 53 02/11/2018 0830   ALKPHOS 72 10/13/2016 1503   AST 15 01/24/2019 1558   AST 14 10/13/2016 1503   ALT 17 01/24/2019 1558   ALT 20 10/13/2016 1503   BILITOT 0.5 01/24/2019 1558   BILITOT 0.2 10/13/2016 1503      Impression and  Plan: Priscilla [name] is a very pleasant 69 yo caucasian female with iron deficiency anemia secondary to malabsorption after lap band surgery.             We will see what her iron studies look like and replace if needed.  We will plan to see her again in another 6 months. She can contact our office with any questions or concerns.   Laverna Peace, NP 8/11/20212:57 PM

## 2019-11-20 LAB — IRON AND TIBC
Iron: 58 ug/dL (ref 41–142)
Saturation Ratios: 20 % — ABNORMAL LOW (ref 21–57)
TIBC: 288 ug/dL (ref 236–444)
UIBC: 229 ug/dL (ref 120–384)

## 2019-11-20 LAB — FERRITIN: Ferritin: 98 ng/mL (ref 11–307)

## 2019-11-25 ENCOUNTER — Inpatient Hospital Stay: Payer: Medicare Other

## 2019-11-25 DIAGNOSIS — H40022 Open angle with borderline findings, high risk, left eye: Secondary | ICD-10-CM | POA: Diagnosis not present

## 2019-11-25 DIAGNOSIS — H2513 Age-related nuclear cataract, bilateral: Secondary | ICD-10-CM | POA: Diagnosis not present

## 2019-11-25 DIAGNOSIS — H43812 Vitreous degeneration, left eye: Secondary | ICD-10-CM | POA: Diagnosis not present

## 2019-11-25 DIAGNOSIS — H524 Presbyopia: Secondary | ICD-10-CM | POA: Diagnosis not present

## 2019-11-25 DIAGNOSIS — H5203 Hypermetropia, bilateral: Secondary | ICD-10-CM | POA: Diagnosis not present

## 2019-11-25 DIAGNOSIS — E113293 Type 2 diabetes mellitus with mild nonproliferative diabetic retinopathy without macular edema, bilateral: Secondary | ICD-10-CM | POA: Diagnosis not present

## 2019-11-25 LAB — HM DIABETES EYE EXAM

## 2019-11-26 ENCOUNTER — Ambulatory Visit (INDEPENDENT_AMBULATORY_CARE_PROVIDER_SITE_OTHER): Payer: Medicare Other | Admitting: Physical Medicine and Rehabilitation

## 2019-11-26 ENCOUNTER — Telehealth: Payer: Self-pay

## 2019-11-26 ENCOUNTER — Other Ambulatory Visit: Payer: Self-pay | Admitting: Family

## 2019-11-26 ENCOUNTER — Encounter: Payer: Self-pay | Admitting: Physical Medicine and Rehabilitation

## 2019-11-26 ENCOUNTER — Other Ambulatory Visit: Payer: Self-pay

## 2019-11-26 ENCOUNTER — Ambulatory Visit: Payer: Self-pay

## 2019-11-26 VITALS — BP 109/68 | HR 67

## 2019-11-26 DIAGNOSIS — M545 Low back pain, unspecified: Secondary | ICD-10-CM

## 2019-11-26 DIAGNOSIS — G8929 Other chronic pain: Secondary | ICD-10-CM | POA: Diagnosis not present

## 2019-11-26 DIAGNOSIS — M47816 Spondylosis without myelopathy or radiculopathy, lumbar region: Secondary | ICD-10-CM | POA: Diagnosis not present

## 2019-11-26 DIAGNOSIS — M25551 Pain in right hip: Secondary | ICD-10-CM | POA: Diagnosis not present

## 2019-11-26 NOTE — Telephone Encounter (Signed)
[facility] eye exam abstracted

## 2019-11-26 NOTE — Progress Notes (Signed)
Right lateral hip pain. Worse when first standing up or with walking. Hurts when lying on the other side. Does not hurt with sitting. Worse at end of day. Numeric Pain Rating Scale and Functional Assessment Average Pain 5   In the last MONTH (on 0-10 scale) has pain interfered with the following?  1. General activity like being  able to carry out your everyday physical activities such as walking, climbing stairs, carrying groceries, or moving a chair?  Rating(7)

## 2019-11-27 ENCOUNTER — Inpatient Hospital Stay: Payer: Medicare Other

## 2019-11-27 ENCOUNTER — Encounter: Payer: Self-pay | Admitting: Physical Medicine and Rehabilitation

## 2019-11-27 ENCOUNTER — Encounter: Payer: Self-pay | Admitting: Family Medicine

## 2019-11-27 VITALS — BP 104/60 | HR 70 | Temp 99.6°F | Resp 17

## 2019-11-27 DIAGNOSIS — D509 Iron deficiency anemia, unspecified: Secondary | ICD-10-CM

## 2019-11-27 DIAGNOSIS — Z9884 Bariatric surgery status: Secondary | ICD-10-CM

## 2019-11-27 DIAGNOSIS — G629 Polyneuropathy, unspecified: Secondary | ICD-10-CM | POA: Diagnosis not present

## 2019-11-27 DIAGNOSIS — K909 Intestinal malabsorption, unspecified: Secondary | ICD-10-CM | POA: Diagnosis not present

## 2019-11-27 DIAGNOSIS — M25551 Pain in right hip: Secondary | ICD-10-CM | POA: Insufficient documentation

## 2019-11-27 DIAGNOSIS — Z79899 Other long term (current) drug therapy: Secondary | ICD-10-CM | POA: Diagnosis not present

## 2019-11-27 DIAGNOSIS — Z7982 Long term (current) use of aspirin: Secondary | ICD-10-CM | POA: Diagnosis not present

## 2019-11-27 DIAGNOSIS — D508 Other iron deficiency anemias: Secondary | ICD-10-CM | POA: Diagnosis not present

## 2019-11-27 DIAGNOSIS — M25559 Pain in unspecified hip: Secondary | ICD-10-CM | POA: Insufficient documentation

## 2019-11-27 DIAGNOSIS — Z7952 Long term (current) use of systemic steroids: Secondary | ICD-10-CM | POA: Diagnosis not present

## 2019-11-27 MED ORDER — SODIUM CHLORIDE 0.9 % IV SOLN
510.0000 mg | Freq: Once | INTRAVENOUS | Status: AC
  Administered 2019-11-27: 510 mg via INTRAVENOUS
  Filled 2019-11-27: qty 17

## 2019-11-27 MED ORDER — BUPIVACAINE HCL 0.25 % IJ SOLN
4.0000 mL | INTRAMUSCULAR | Status: AC | PRN
  Administered 2019-11-26: 4 mL via INTRA_ARTICULAR

## 2019-11-27 MED ORDER — TRIAMCINOLONE ACETONIDE 40 MG/ML IJ SUSP
60.0000 mg | INTRAMUSCULAR | Status: AC | PRN
  Administered 2019-11-26: 60 mg via INTRA_ARTICULAR

## 2019-11-27 MED ORDER — SODIUM CHLORIDE 0.9 % IV SOLN
INTRAVENOUS | Status: DC
  Filled 2019-11-27: qty 250

## 2019-11-27 NOTE — Progress Notes (Signed)
ALIANI CACCAVALE - 69 y.o. female MRN [id number]  Date of birth: [birth year]  Office Visit Note: Visit Date: 11/26/2019 PCP: Ma Hillock, DO Referred by: Ma Hillock, DO  Subjective: Chief Complaint  Patient presents with  . Right Hip - Pain   HPI: Priscilla [name] is a 69 y.o. female who comes in today For evaluation and management of 2 distinct problems.  #1 is several months of worsening right hip pain.  Secondary problem is chronic severe low back pain which has been better with diagnostic facet blocks and continues to bother her on a daily basis.  In terms of her right hip pain she does not note any specific trauma.  She reports recent worsening of anterior lateral hip pain no real referral into the groin although it can be the anterior upper part of the thigh.  Some pain posteriorly.  She does not get any radicular pain past the knee or paresthesias.  Nothing on the left.  No history of hip arthritis.  She reports that the pain actually hurts on the right side if she lays on her left side.  She also reports worsening pain going from sit to stand.  She rates her pain as a 6 out of 10 and this is a new area despite using medications and trying the do activity modification.  Her case is complicated by diabetes with peripheral polyneuropathy as well as psoriatic arthritis and anxiety.  In terms of her axial back pain she does gets pain with standing she does have imaging consistent with arthritis.  She has failed conservative care including medications and physical therapy.  This is been ongoing for years.  She is done well with intermittent facet joint blocks and medial branch blocks.  She does meet all the criteria at this point for pursuing radiofrequency ablation of the lower facet joints.  Last injection was in June and this was L4-5.  She actually gets quite a bit of relief from the blocks.  She gets more than 80% relief.  She says she still doing fairly well with the back pain that  is her hip pain is really bothering her the most at this point.  She has been followed in the past by Dr. Anderson Malta from an orthopedic standpoint.  She still continues to see Dr. Cy Blamer for psoriatic arthritis.  Review of Systems  Musculoskeletal: Positive for back pain and joint pain.  All other systems reviewed and are negative.  Otherwise per HPI.  Assessment & Plan: Visit Diagnoses:  1. Pain in right hip   2. Spondylosis without myelopathy or radiculopathy, lumbar region   3. Chronic bilateral low back pain without sciatica     Plan: Findings:  1.  Right anterior lateral hip pain which is somewhat confusing from a clinical diagnostic standpoint.  It has some features consistent with a bursitis but no real pain over the bursa to palpation.  She does not have a lot of pain when I internally rotate her but when she tries to internally rotate actively it really gives her a lot of grief in this anterior lateral aspect of the hip.  It is worse going from sit to stand.  I reviewed x-rays from the past that did show partial hip pictures that did not show severe arthritis.  She could have some type of labral issue or other problems is not showing up on x-ray.  I do feel like this is more hip related than back  related.  We are going to complete a diagnostic anesthetic arthrogram today with fluoroscopic guidance.  Depending on relief will probably have her see Dr. Anderson Malta or potentially look at MRI imaging of the hip.  If it does not seem to help then we would probably regroup either with local injection or regrouping with physical therapy.  Alternative diagnoses would be pain of the tensor fascia lata of some sort of sprain strain.  Could be radicular pain but I do not think it just behaves that way.  As of note we did complete the injection today and she actually had outstanding relief during the diagnostic anesthetic phase of it.  2.  For her chronic long-term axial low back pain she  is a candidate for radiofrequency ablation.  She meets all the criteria typically set out for that.  Its been chronic more than 3 months in fact it has been years.  She has x-ray and MRI imaging of facet arthritis without any other causes.  She has no radicular complaints.  She has had double diagnostic blocks with more than 70% relief of her back pain.  She has had failed conservative care with medications at home exercise and physical therapy.  When her back pain is more of an issue we could look at radiofrequency ablation.  This would be of the L4-5 facet joint with fluoroscopic guidance.      Meds & Orders: No orders of the defined types were placed in this encounter.   Orders Placed This Encounter  Procedures  . Large Joint Inj: R hip joint  . XR C-ARM NO REPORT    Follow-up: Return if symptoms worsen or fail to improve.   Procedures: Large Joint Inj: R hip joint on 11/26/2019 11:00 AM Indications: pain and diagnostic evaluation Details: 22 G needle, anterior approach  Arthrogram: Yes  Medications: 4 mL bupivacaine 0.25 %; 60 mg triamcinolone acetonide 40 MG/ML Outcome: tolerated well, no immediate complications  Arthrogram demonstrated excellent flow of contrast throughout the joint surface without extravasation or obvious defect.  The patient had relief of symptoms during the anesthetic phase of the injection.  Procedure, treatment alternatives, risks and benefits explained, specific risks discussed. Consent was given by the patient. Immediately prior to procedure a time out was called to verify the correct patient, procedure, equipment, support staff and site/side marked as required. Patient was prepped and draped in the usual sterile fashion.      No notes on file   Clinical History: L3-L4: Eccentric left mild broad-based disc bulge. Moderate bilateral facet arthropathy. Mild spinal stenosis. No evidence of neural foraminal stenosis.  L4-L5: Moderate broad-based disc  bulge with a posterior annular fissure. Severe bilateral facet arthropathy with ligamentum flavum infolding resulting in severe spinal stenosis and bilateral lateral recess stenosis. No evidence of neural foraminal stenosis.  L5-S1: Mild broad-based disc bulge. Moderate bilateral facet arthropathy. No evidence of neural foraminal stenosis. No central canal stenosis.  IMPRESSION: 1. Lumbar spine spondylosis most severe at L4-5. 2. At L4-5 there is a moderate broad-based disc bulge with a posterior annular fissure. Severe bilateral facet arthropathy with ligamentum flavum infolding resulting in severe spinal stenosis and bilateral lateral recess stenosis.   Electronically Signed   By: Kathreen Devoid   On: 10/31/2014 16:53   She reports that she has never smoked. She has never used smokeless tobacco.  Recent Labs    02/13/19 1633 04/24/19 1541 08/25/19 1534  HGBA1C 6.0* 5.9* 6.3*    Objective:  VS:  HT:    WT:   BMI:     BP:109/68  HR:67bpm  TEMP: ( )  RESP:  Physical Exam Vitals and nursing note reviewed.  Constitutional:      General: She is not in acute distress.    Appearance: Normal appearance. She is well-developed. She is not ill-appearing.  HENT:     Head: Normocephalic and atraumatic.  Eyes:     Conjunctiva/sclera: Conjunctivae normal.     Pupils: Pupils are equal, round, and reactive to light.  Cardiovascular:     Rate and Rhythm: Normal rate.     Pulses: Normal pulses.  Pulmonary:     Effort: Pulmonary effort is normal.  Musculoskeletal:     Right lower leg: No edema.     Left lower leg: No edema.     Comments: Patient somewhat slow to rise from a seated position to full extension.  There is concordant low back pain with facet loading and lumbar spine extension rotation.  There are no definitive trigger points but the patient is somewhat tender across the lower back and PSIS.  There is not much pain with passive hip rotation but there is pain with  active hip rotation on the right internally.  She is unable to internally rotate as much on the right as the left.  There really is no pain over the greater trochanter.  Some pain over the tensor fascia lata.  Skin:    General: Skin is warm and dry.     Findings: No erythema or rash.  Neurological:     General: No focal deficit present.     Mental Status: She is alert and oriented to person, place, and time.     Sensory: No sensory deficit.     Motor: No weakness or abnormal muscle tone.     Coordination: Coordination normal.     Gait: Gait abnormal.  Psychiatric:        Mood and Affect: Mood normal.        Behavior: Behavior normal.     Ortho Exam  Imaging: XR C-ARM NO REPORT  Result Date: 11/26/2019 Please see Notes tab for imaging impression.   Past Medical/Family/Surgical/Social History: Medications & Allergies reviewed per EMR, new medications updated. Patient Active Problem List   Diagnosis Date Noted  . Type 2 diabetes mellitus with retinopathy, with long-term current use of insulin ([place]) 05/17/2018  . Overweight (BMI 25.0-29.9) 02/11/2018  . Vitamin D deficiency 10/13/2016  . 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 Iron deficiency anemia 04/28/2016  . Other insomnia 03/14/2016  . DDD (degenerative disc disease), cervical 03/14/2016  . Spondylosis without myelopathy or radiculopathy, lumbar region 01/31/2016  . Medicare annual wellness visit, initial 08/06/2015  . Anxiety and depression 06/25/2014  . Psoriatic arthritis - sees rheumatologist 02/20/2014  . Asthma 12/14/2008  . Diabetic peripheral neuropathy associated with type 2 diabetes mellitus ([place]) 08/06/2008  . Hyperlipemia 11/12/2006  . Essential hypertension 11/12/2006   Past Medical History:  Diagnosis Date  . Anemia 12/14/2008  . Anxiety and depression 12/14/2008  . Asthma 11/12/2006  . CAD (coronary artery disease) 12/14/2008   hx transluminal coronary angioplasty   . Cataract   . Depression   . Diabetes mellitus with neuropathy ([place]) 12/14/2008  . Fibromyalgia 01/04/2007   sees rheuamtology  . 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 Hyperlipemia 01/21/2010  . Hypertension 12/14/2008  . Leg edema 12/14/2008  . Malabsorption of  iron 08/31/2016  . OSA (obstructive sleep apnea) 08/06/2008  . Osteoarthritis 12/14/2008  . Palpitations 11/12/2006   hx sinus tachy  . Psoriatic arthritis [facility])    sees rheumatology  . Rotator cuff injury    Family History  Problem Relation Age of Onset  . Arthritis Mother   . Alzheimer's disease Mother   . Stroke Father   . Alcohol abuse Father   . Diabetes Father   . Heart disease Father   . Early death Father 54  . Diabetes Sister   . Arthritis Sister   . COPD Sister   . Arthritis Maternal Aunt   . Diabetes Maternal Aunt   . Early death Maternal Aunt 8  . Diabetes Maternal Uncle   . Cancer Paternal Aunt   . COPD Paternal 25   . Heart disease Paternal Aunt   . Heart disease Paternal Uncle   . Alcohol abuse Paternal Grandfather   . Heart disease Paternal Grandfather   . Early death Paternal Grandfather 33  . Stroke Paternal Grandfather   . Hypertension Son   . Autoimmune disease Daughter   . Breast cancer Neg Hx    Past Surgical History:  Procedure Laterality Date  . ABDOMINAL HYSTERECTOMY    . CHOLECYSTECTOMY    . LAPAROSCOPIC GASTRIC BANDING    . PTCA    . WRIST SURGERY     LEFT   Social History   Occupational History  . Not on file  Tobacco Use  . Smoking status: Never Smoker  . Smokeless tobacco: Never Used  Vaping Use  . Vaping Use: Never used  Substance and Sexual Activity  . Alcohol use: Yes    Comment: glass of wine-special occasion  . Drug use: Never  . Sexual activity: Never    Birth control/protection: None

## 2019-11-27 NOTE — Patient Instructions (Signed)

## 2019-12-12 ENCOUNTER — Other Ambulatory Visit: Payer: Self-pay | Admitting: Rheumatology

## 2019-12-12 NOTE — Telephone Encounter (Signed)
Last visit: 09/03/2019 Next visit: 02/25/2020  Last Fill:11/13/2019  Okay to refill Ambien?

## 2019-12-16 ENCOUNTER — Ambulatory Visit: Payer: Medicare Other | Admitting: Podiatry

## 2019-12-23 ENCOUNTER — Telehealth: Payer: Self-pay | Admitting: Physical Medicine and Rehabilitation

## 2019-12-23 ENCOUNTER — Ambulatory Visit: Payer: Medicare Other

## 2019-12-23 DIAGNOSIS — M25559 Pain in unspecified hip: Secondary | ICD-10-CM

## 2019-12-23 DIAGNOSIS — M25551 Pain in right hip: Secondary | ICD-10-CM

## 2019-12-23 NOTE — Telephone Encounter (Signed)
Please advise 

## 2019-12-23 NOTE — Telephone Encounter (Signed)
Pt called wanting to update [place] on her injection from 11/26/19; pt states she slowly noticed her hip increasing in pain over time and pt states her pain levels are currently 5/10,10 being the worst. Pt would like a CB in regards to the plan for her now.  [phone]

## 2019-12-23 NOTE — Telephone Encounter (Signed)
MRI hip vs f/u Dr. Marlou Sa

## 2019-12-24 NOTE — Telephone Encounter (Signed)
MRI ordered and patient notified. She will call us when this is scheduled so we can schedule her a follow up with either Dr. Ernestina Patches or Dr. Marlou Sa,

## 2019-12-30 ENCOUNTER — Encounter: Payer: Self-pay | Admitting: Internal Medicine

## 2019-12-30 ENCOUNTER — Ambulatory Visit (INDEPENDENT_AMBULATORY_CARE_PROVIDER_SITE_OTHER): Payer: Medicare Other | Admitting: Internal Medicine

## 2019-12-30 ENCOUNTER — Other Ambulatory Visit: Payer: Self-pay

## 2019-12-30 VITALS — BP 110/70 | HR 68 | Ht 64.0 in | Wt 155.0 lb

## 2019-12-30 DIAGNOSIS — E11319 Type 2 diabetes mellitus with unspecified diabetic retinopathy without macular edema: Secondary | ICD-10-CM | POA: Diagnosis not present

## 2019-12-30 DIAGNOSIS — Z794 Long term (current) use of insulin: Secondary | ICD-10-CM

## 2019-12-30 DIAGNOSIS — E1142 Type 2 diabetes mellitus with diabetic polyneuropathy: Secondary | ICD-10-CM | POA: Diagnosis not present

## 2019-12-30 DIAGNOSIS — E785 Hyperlipidemia, unspecified: Secondary | ICD-10-CM | POA: Diagnosis not present

## 2019-12-30 LAB — POCT GLYCOSYLATED HEMOGLOBIN (HGB A1C): Hemoglobin A1C: 6.3 % — AB (ref 4.0–5.6)

## 2019-12-30 MED ORDER — NOVOLOG FLEXPEN 100 UNIT/ML ~~LOC~~ SOPN: 3.0000 [IU] | PEN_INJECTOR | Freq: Three times a day (TID) | SUBCUTANEOUS | 3 refills | Status: DC

## 2019-12-30 MED ORDER — METFORMIN HCL 1000 MG PO TABS
ORAL_TABLET | ORAL | 3 refills | Status: DC
Start: 2019-12-30 — End: 2021-01-24

## 2019-12-30 NOTE — Patient Instructions (Addendum)
Please continue: - Metformin 1000 mg 2x a day with meals - Novolog 3-6 units 15 minutes before a larger dinner - Levemir 15 units at bedtime  Please check with your insurance if they cover: - Basaglar, Lantus, Semglee, Levemir, Toujeo, Tresiba (long acting) - Humalog, Lyumjev, Novolog, FiAsp, Apidra (rapid acting)  Please come back for a follow-up appointment in 4 months.

## 2019-12-30 NOTE — Addendum Note (Signed)
Addended by: Cardell Peach I on: 12/30/2019 04:02 PM   Modules accepted: Orders

## 2019-12-30 NOTE — Progress Notes (Signed)
Patient ID: Priscilla [name], female   DOB: Jun 22, 1950, 69 y.o.   MRN: [id number]  This visit occurred during the SARS-CoV-2 public health emergency.  Safety protocols were in place, including screening questions prior to the visit, additional usage of staff PPE, and extensive cleaning of exam room while observing appropriate contact time as indicated for disinfecting solutions.   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 She previously saw Drs Priscilla [name] (distant past) and Priscilla [name] (more recently). Last visit with me 70 months ago.  She continues to have a lot of stress at home due to her husband losing his memory.  He has vascular dementia.  3 weeks ago she got a hip steroid inj. Sugars did increase for 3 to 4 days.  She had an iron infusion 11/27/2019.  Reviewed HbA1c levels: Lab Results  Component Value Date   HGBA1C 6.3 (A) 08/25/2019   HGBA1C 5.9 (A) 04/24/2019   HGBA1C 6.0 (A) 02/13/2019   HGBA1C 6.1 (A) 09/20/2018   HGBA1C 6.4 (A) 05/23/2018   HGBA1C 6.0 (A) 10/17/2017   HGBA1C 6.3 06/13/2017   HGBA1C 5.9 11/24/2016   HGBA1C 6.5 (H) 04/22/2016   HGBA1C 7.4 05/18/2015   HGBA1C 7.3 (A) 12/10/2014   HGBA1C 8.0 (H) 06/05/2014   HGBA1C 7.6 (H) 09/26/2013   HGBA1C 7.3 (H) 02/07/2013   HGBA1C 7.0 (H) 06/28/2012  03/16/2017: HbA1c calculated from fructosamine: 5.6% 10/18/2015: HbA1c calculated from fructosamine: 5.7% 02/2015: HbA1c 7.4%, HbA1c calculated from fructosamine: 5.6%  She is on: - Metformin 1000 mg 2x a day with meals - Levemir 30 >> 15 units 2x a day >> 15 units >> 10 >> 18 >> 15 units at bedtime - Mealtime Novolog: 4 to 8 units with dinner (at the start of the meal) >> 3-6 units 15-30 min before    If sugars before the meal are 60 or lower, please do not take the Novolog dose. If sugars before the meal are 61-80, take only half of the  Novolog dose. She was on Victoza 2.5 mg daily >> stopped as she could not afford this.  Pt.checks her sugars more than 4 times a day with her CGM.  Freestyle libre CGM parameters: - Average:120 >> 128 - % active CGM time: 95% >> 96% of the time - Glucose variability 23.7%  >> 20.6% (target < or = to 36%) - time in range:  - very low (<54): 0% >> 0% - low (54-69): 0% >> 0% - normal range (70-180): 97% >> 95% - high sugars (181-250): 3% >> 5% - very high sugars (>250): 0% >> 0%    Previously:   Previously:   Lowest sugar was 46 >> 50-at night >> 50s >> 42 >> 60; she has hypoglycemia awareness in the in the 70s.   Highest sugar was 285 x1 (cake) >> 247 >> 224 >> 200.  Glucometer: Freestyle  -No CKD; last BUN/creatinine:  Lab Results  Component Value Date   BUN 20 11/19/2019   CREATININE 0.84 11/19/2019  ACR (12/2014): 6.9 On lisinopril 1.25 mg daily -+ HL; last set of lipids: Lab Results  Component Value Date   CHOL 185 03/19/2019   HDL 58.30 03/19/2019   LDLCALC 111 (H) 03/19/2019   TRIG 80.0 03/19/2019   CHOLHDL 3 03/19/2019  On pravastatin. 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  She has floaters. -+ Improved numbness, tingling, burning in her feet.  On B12.  I recommended alpha lipoic acid but she did not take this due to the large size of the pill.  Started Neurontin 100 mg daily per podiatry.  Her latest TSH was normal: Lab Results  Component Value Date   TSH 1.97 03/19/2019   She also has a history of lap band surgery >> she does not usually eat large meals but she usually grazes, especially at night  ROS: Constitutional: no weight gain/no weight loss, no fatigue, no subjective hyperthermia, no subjective hypothermia Eyes: no blurry vision, no xerophthalmia ENT: no sore throat, no nodules palpated in neck, no dysphagia, no odynophagia, no hoarseness Cardiovascular: no CP/no SOB/no  palpitations/no leg swelling Respiratory: no cough/no SOB/no wheezing Gastrointestinal: no N/no V/no D/no C/no acid reflux Musculoskeletal: no muscle aches/no joint aches Skin: no rashes, no hair loss Neurological: no tremors/+ numbness/+ tingling/no dizziness  I reviewed pt's medications, allergies, PMH, social hx, family hx, and changes were documented in the history of present illness. Otherwise, unchanged from my initial visit note.  Past Medical History:  Diagnosis Date  . Anemia 12/14/2008  . Anxiety and depression 12/14/2008  . Asthma 11/12/2006  . CAD (coronary artery disease) 12/14/2008   hx transluminal coronary angioplasty  . Cataract   . Depression   . Diabetes mellitus with neuropathy ([place]) 12/14/2008  . Fibromyalgia 01/04/2007   sees rheuamtology  . 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 Hyperlipemia 01/21/2010  . Hypertension 12/14/2008  . Leg edema 12/14/2008  . 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 OSA (obstructive sleep apnea) 08/06/2008  . Osteoarthritis 12/14/2008  . Palpitations 11/12/2006   hx sinus tachy  . Psoriatic arthritis [facility])    sees rheumatology  . Rotator cuff injury    Past Surgical History:  Procedure Laterality Date  . ABDOMINAL HYSTERECTOMY    . CHOLECYSTECTOMY    . LAPAROSCOPIC GASTRIC BANDING    . PTCA    . WRIST SURGERY     LEFT   Social History   Social History  . Marital Status: Married    Spouse Name: N/A  . Number of Children: N/A   Occupational History  . Not on file.   Social History Main Topics  . Smoking status: Never Smoker   . Smokeless tobacco: Never Used  . Alcohol Use: Yes     Comment: glass of wine-specially occasion  . Drug Use: No   Current Outpatient Medications on File Prior to Visit  Medication Sig Dispense Refill  . albuterol (PROVENTIL HFA;VENTOLIN HFA) 108 (90 Base) MCG/ACT inhaler Inhale 2 puffs into the lungs every 6 (six) hours as needed for wheezing or shortness of breath. 1 Inhaler 1  . aspirin 81 MG  tablet Take 81 mg by mouth daily.      . Biotin 5000 MCG TABS Take 5,000 mcg by mouth daily.     . calcium-vitamin D 250-100 MG-UNIT per tablet Take 1 tablet by mouth daily.     . Cyanocobalamin (VITAMIN B12 PO) Place under the tongue.    . DULoxetine (CYMBALTA) 60 MG capsule Take 1 capsule (60 mg total) by mouth daily. Take with 30 mg to equal 90 mg daily. 90 capsule 1  . glucose blood (ONETOUCH VERIO) test strip USE TO TEST BLOOD SUGAR TWICE A DAY DX: E11.319 200 each 0  . insulin detemir (LEVEMIR) 100 UNIT/ML injection Inject 0.15 mLs (15 Units  total) into the skin at bedtime. 30 mL 3  . Insulin Syringe-Needle U-100 (B-D INS SYR MICROFINE 1CC/27G) 27G X 5/8" 1 ML MISC Use to inject levemir once daily DX: E11.319 120 each 6  . lisinopril (ZESTRIL) 2.5 MG tablet Take 1 tablet (2.5 mg total) by mouth daily. 30 tablet 5  . metFORMIN (GLUCOPHAGE) 1000 MG tablet TAKE 1 TABLET BY MOUTH TWICE DAILY WITH MEALS 108 tablet 1  . metoprolol tartrate (LOPRESSOR) 25 MG tablet Take 1 tablet (25 mg total) by mouth 2 (two) times daily. 60 tablet 5  . Multiple Vitamin (MULTIVITAMIN) tablet Take 1 tablet by mouth daily.     . pravastatin (PRAVACHOL) 20 MG tablet Take 1 tablet (20 mg total) by mouth daily. 30 tablet 11  . sertraline (ZOLOFT) 50 MG tablet Take 1 tablet (50 mg total) by mouth daily. 30 tablet 5  . Vitamin D, Ergocalciferol, (DRISDOL) 1.25 MG (50000 UNIT) CAPS capsule TAKE 1 CAPSULE BY MOUTH EVERY 7 DAYS 4 capsule 6  . zolpidem (AMBIEN) 5 MG tablet TAKE 1 TABLET BY MOUTH AT BEDTIME AS NEEDED FOR INSOMNIA 30 tablet 0   No current facility-administered medications on file prior to visit.   No Known Allergies Family History  Problem Relation Age of Onset  . Arthritis Mother   . Alzheimer's disease Mother   . Stroke Father   . Alcohol abuse Father   . Diabetes Father   . Heart disease Father   . Early death Father 59  . Diabetes Sister   . Arthritis Sister   . COPD Sister   . Arthritis  Maternal Aunt   . Diabetes Maternal Aunt   . Early death Maternal Aunt 109  . Diabetes Maternal Uncle   . Cancer Paternal Aunt   . COPD Paternal 9   . Heart disease Paternal Aunt   . Heart disease Paternal Uncle   . Alcohol abuse Paternal Grandfather   . Heart disease Paternal Grandfather   . Early death Paternal Grandfather 48  . Stroke Paternal Grandfather   . Hypertension Son   . Autoimmune disease Daughter   . Breast cancer Neg Hx    PE: BP 110/70   Pulse 68   Ht 5\' 4"  (1.626 m)   Wt 155 lb (70.3 kg)   LMP  (LMP Unknown)   SpO2 97%   BMI 26.61 kg/m  Body mass index is 26.61 kg/m. Wt Readings from Last 3 Encounters:  12/30/19 155 lb (70.3 kg)  11/19/19 157 lb 6.4 oz (71.4 kg)  09/23/19 161 lb 6 oz (73.2 kg)   Constitutional: Slightly overweight, in NAD Eyes: PERRLA, EOMI, no exophthalmos ENT: moist mucous membranes, no thyromegaly, no cervical lymphadenopathy Cardiovascular: RRR, No MRG Respiratory: CTA B Gastrointestinal: abdomen soft, NT, ND, BS+ Musculoskeletal: no deformities, strength intact in all 4 Skin: moist, warm, no rashes Neurological: no tremor with outstretched hands, DTR normal in all 4  ASSESSMENT: 1. DM2, insulin-dependent, uncontrolled, with complications - Diabetic retinopathy left eye - resolved - Peripheral neuropathy  Component     Latest Ref Rng & Units 04/24/2019  Hemoglobin A1C     4.0 - 5.6 % 5.9 (A)  Islet Cell Ab     Neg:<1:1 Negative  ZNT8 Antibodies     U/mL <15  C-Peptide     0.80 - 3.85 ng/mL 2.35  Glucose, Plasma     65 - 99 mg/dL 130 (H)  Glutamic Acid Decarb Ab     <5 IU/mL <5  No  insulin deficiency or pancreatic autoimmunity.  2. HL  3. PN   PLAN:  1. Patient with longstanding, previously uncontrolled type 2 diabetes, on Metformin and basal-bolus insulin regimen, with improved control in the last 2.5 years.  After her gastric bypass surgery, she was grazing and not having large meals.  We will reduce her  insulin doses.  We did check her for type 1 diabetes in the past and the tests were negative.  At last visit, she was still taking a higher dose of Levemir with sugars in the sixties overnight as checked by her freestyle libre two CGM.  We discussed about reducing this to 15 units at bedtime.  Her sugars were slightly higher after meals and, upon questioning, she was not taking NovoLog before larger dinners and I advised her to start doing so.  We did not change her regimen otherwise.  HbA1c at that time was slightly higher, but still at goal:  6.3%. CGM interpretation: -At today's visit, we reviewed together her CGM tracings.  It appears that 95% of her values are in range, a slight decrease from 97% from last visit.  She has 5% of blood sugars between 181 and 250, again, a slight increase from 3% at last visit.  The changes are most likely related to decreasing the Lantus dose by 3 units.  She is very insulin sensitive.  She also describes using lower doses of NovoLog especially with dinner to avoid low blood sugars at night.  However, per review of the CGM tracings, this is not a problem that occurs right now, and the lowest blood sugars overnight have been in the 60s as checked by the sensor.  Therefore, for now, I feel that we can continue the same dose of Levemir.  Regarding the dose of NovoLog, review of the sensor data, does not show a significant postprandial increase in her blood sugars so we can also continue with these doses. -She complains that her insulin is expensive so I advised her to check with her insurance to see which analogs are covered and let me know so we can switch to a more affordable regimen.  For now, she agrees to continue with the current regimen. - I advised her to:  Patient Instructions  Please continue: - Metformin 1000 mg 2x a day with meals - Novolog 3-6 units 15 minutes before a larger dinner - Levemir 15 units at bedtime  Please check with your insurance if they  cover: - Basaglar, Lantus, Semglee, Levemir, Toujeo, Tresiba (long acting) - Humalog, Lyumjev, Novolog, FiAsp, Apidra (rapid acting)  Please come back for a follow-up appointment in 4 months.  - we checked her HbA1c: 6.3% (stable) - advised to check sugars at different times of the day - 4x a day, rotating check times - advised for yearly eye exams >> she is UTD - return to clinic in 4 months   2. HL -Reviewed latest lipid panel from 03/2019: LDL slightly high, the rest of the fraction is at goal Lab Results  Component Value Date   CHOL 185 03/19/2019   HDL 58.30 03/19/2019   LDLCALC 111 (H) 03/19/2019   TRIG 80.0 03/19/2019   CHOLHDL 3 03/19/2019  -Continues the statin without side effects  3. PN -2/2 diabetes -Stable -She continues on B12, Cymbalta and also added Neurontin per podiatry.  These are helping. -She could not swallow the alpha-lipoic acid that I suggested in the past, states this was a large tablet.  Philemon Kingdom, MD PhD  [facility]

## 2019-12-31 ENCOUNTER — Ambulatory Visit: Payer: Medicare Other

## 2020-01-06 ENCOUNTER — Ambulatory Visit: Payer: Medicare Other | Admitting: Podiatry

## 2020-01-07 ENCOUNTER — Other Ambulatory Visit: Payer: Self-pay | Admitting: *Deleted

## 2020-01-07 MED ORDER — ZOLPIDEM TARTRATE 5 MG PO TABS: 5.0000 mg | ORAL_TABLET | Freq: Every evening | ORAL | 0 refills | Status: DC | PRN

## 2020-01-07 NOTE — Telephone Encounter (Signed)
Last visit: 09/03/2019 Next visit: 02/25/2020  Last Fill: 12/12/2019  Okay to refill Ambien?

## 2020-01-13 NOTE — Progress Notes (Signed)
Subjective:   Priscilla [name] is a 69 y.o. female who presents for Medicare Annual (Subsequent) preventive examination.  I connected with [place] today by telephone and verified that I am speaking with the correct person using two identifiers. Location patient: home Location provider: work Persons participating in the virtual visit: patient, Marine scientist.    I discussed the limitations, risks, security and privacy concerns of performing an evaluation and management service by telephone and the availability of in person appointments. I also discussed with the patient that there may be a patient responsible charge related to this service. The patient expressed understanding and verbally consented to this telephonic visit.    Interactive audio and video telecommunications were attempted between this provider and patient, however failed, due to patient having technical difficulties OR patient did not have access to video capability.  We continued and completed visit with audio only.  Some vital signs may be absent or patient reported.   Time Spent with patient on telephone encounter: 25 minutes  Review of Systems     Cardiac Risk Factors include: advanced age (>36men, >48 women);dyslipidemia;hypertension;diabetes mellitus;sedentary lifestyle     Objective:    Today's Vitals   01/14/20 0942  Weight: 155 lb (70.3 kg)  Height: 5\' 4"  (1.626 m)  PainSc: 5    Body mass index is 26.61 kg/m.  Advanced Directives 01/14/2020 11/19/2019 12/03/2018 01/08/2018 11/27/2017 04/13/2017 01/12/2017  Does Patient Have a Medical Advance Directive? No No No Yes No No No  Type of Advance Directive - - Public librarian;Living will - - -  Copy of [place] in Chart? - - - No - copy requested - - -  Would patient like information on creating a medical advance directive? Yes (MAU/Ambulatory/Procedural Areas - Information given) No - Patient declined No - Patient declined - No - Patient  declined No - Patient declined -    Current Medications (verified) Outpatient Encounter Medications as of 01/14/2020  Medication Sig  . albuterol (PROVENTIL HFA;VENTOLIN HFA) 108 (90 Base) MCG/ACT inhaler Inhale 2 puffs into the lungs every 6 (six) hours as needed for wheezing or shortness of breath.  Marland Kitchen aspirin 81 MG tablet Take 81 mg by mouth daily.    . Biotin 5000 MCG TABS Take 5,000 mcg by mouth daily.   . calcium-vitamin D 250-100 MG-UNIT per tablet Take 1 tablet by mouth daily.   . Cyanocobalamin (VITAMIN B12 PO) Place under the tongue.  . DULoxetine (CYMBALTA) 60 MG capsule Take 1 capsule (60 mg total) by mouth daily. Take with 30 mg to equal 90 mg daily.  Marland Kitchen glucose blood (ONETOUCH VERIO) test strip USE TO TEST BLOOD SUGAR TWICE A DAY DX: E11.319  . insulin aspart (NOVOLOG FLEXPEN) 100 UNIT/ML FlexPen Inject 3-8 Units into the skin 3 (three) times daily with meals.  . insulin detemir (LEVEMIR) 100 UNIT/ML injection Inject 0.15 mLs (15 Units total) into the skin at bedtime.  . Insulin Syringe-Needle U-100 (B-D INS SYR MICROFINE 1CC/27G) 27G X 5/8" 1 ML MISC Use to inject levemir once daily DX: E11.319  . lisinopril (ZESTRIL) 2.5 MG tablet Take 1 tablet (2.5 mg total) by mouth daily.  . metFORMIN (GLUCOPHAGE) 1000 MG tablet TAKE 1 TABLET BY MOUTH TWICE DAILY WITH MEALS  . metoprolol tartrate (LOPRESSOR) 25 MG tablet Take 1 tablet (25 mg total) by mouth 2 (two) times daily.  . Multiple Vitamin (MULTIVITAMIN) tablet Take 1 tablet by mouth daily.   . pravastatin (PRAVACHOL) 20  MG tablet Take 1 tablet (20 mg total) by mouth daily.  . sertraline (ZOLOFT) 50 MG tablet Take 1 tablet (50 mg total) by mouth daily.  . Vitamin D, Ergocalciferol, (DRISDOL) 1.25 MG (50000 UNIT) CAPS capsule TAKE 1 CAPSULE BY MOUTH EVERY 7 DAYS  . zolpidem (AMBIEN) 5 MG tablet Take 1 tablet (5 mg total) by mouth at bedtime as needed for sleep.   No facility-administered encounter medications on file as of 01/14/2020.      Allergies (verified) Patient has no known allergies.   History: Past Medical History:  Diagnosis Date  . Anemia 12/14/2008  . Anxiety and depression 12/14/2008  . Asthma 11/12/2006  . CAD (coronary artery disease) 12/14/2008   hx transluminal coronary angioplasty  . Cataract   . Depression   . Diabetes mellitus with neuropathy ([place]) 12/14/2008  . Fibromyalgia 01/04/2007   sees rheuamtology  . 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 Hyperlipemia 01/21/2010  . Hypertension 12/14/2008  . Leg edema 12/14/2008  . 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 OSA (obstructive sleep apnea) 08/06/2008  . Osteoarthritis 12/14/2008  . Palpitations 11/12/2006   hx sinus tachy  . Psoriatic arthritis [facility])    sees rheumatology  . Rotator cuff injury    Past Surgical History:  Procedure Laterality Date  . ABDOMINAL HYSTERECTOMY    . CHOLECYSTECTOMY    . LAPAROSCOPIC GASTRIC BANDING    . PTCA    . WRIST SURGERY     LEFT   Family History  Problem Relation Age of Onset  . Arthritis Mother   . Alzheimer's disease Mother   . Stroke Father   . Alcohol abuse Father   . Diabetes Father   . Heart disease Father   . Early death Father 86  . Diabetes Sister   . Arthritis Sister   . COPD Sister   . Arthritis Maternal Aunt   . Diabetes Maternal Aunt   . Early death Maternal Aunt 51  . Diabetes Maternal Uncle   . Cancer Paternal Aunt   . COPD Paternal 46   . Heart disease Paternal Aunt   . Heart disease Paternal Uncle   . Alcohol abuse Paternal Grandfather   . Heart disease Paternal Grandfather   . Early death Paternal Grandfather 22  . Stroke Paternal Grandfather   . Hypertension Son   . Autoimmune disease Daughter   . Breast cancer Neg Hx    Social History   Socioeconomic History  . Marital status: Married    Spouse name: Not on file  . Number of children: Not on file  . Years of education: Not on file  . Highest education level: Not on file  Occupational History  . Occupation:  retired  Tobacco Use  . Smoking status: Never Smoker  . Smokeless tobacco: Never Used  Vaping Use  . Vaping Use: Never used  Substance and Sexual Activity  . Alcohol use: Yes    Comment: glass of wine-special occasion  . Drug use: Never  . Sexual activity: Never    Birth control/protection: None  Other Topics Concern  . Not on file  Social History Narrative   Married, [name]. 2 children name Abe People and Anderson Malta.   Some college, retired Engineer, manufacturing.   Drinks caffeine.   Take a daily vitamin.   Wears her seatbelt, exercises 3 times a week.   Smoke detector in the home.   Firearms in the home.   Feels safe in her relationships.   Social  Determinants of Health   Financial Resource Strain: Low Risk   . Difficulty of Paying Living Expenses: Not hard at all  Food Insecurity: No Food Insecurity  . Worried About Charity fundraiser in the Last Year: Never true  . Ran Out of Food in the Last Year: Never true  Transportation Needs: No Transportation Needs  . Lack of Transportation (Medical): No  . Lack of Transportation (Non-Medical): No  Physical Activity: Inactive  . Days of Exercise per Week: 0 days  . Minutes of Exercise per Session: 0 min  Stress: No Stress Concern Present  . Feeling of Stress : Only a little  Social Connections: Moderately Isolated  . Frequency of Communication with Friends and Family: More than three times a week  . Frequency of Social Gatherings with Friends and Family: Once a week  . Attends Religious Services: Never  . Active Member of Clubs or Organizations: No  . Attends Archivist Meetings: Never  . Marital Status: Married    Tobacco Counseling Counseling given: Not Answered   Clinical Intake:  Pre-visit preparation completed: Yes  Pain : 0-10 Pain Score: 5  Pain Type: Chronic pain Pain Location: Hip Pain Orientation: Right Pain Onset: More than a month ago Pain Frequency: Intermittent     Nutritional  Status: BMI 25 -29 Overweight Nutritional Risks: None Diabetes: Yes CBG done?: No Did pt. bring in CBG monitor from home?: No (phone visit)  How often do you need to have someone help you when you read instructions, pamphlets, or other written materials from your doctor or pharmacy?: 1 - Never What is the last grade level you completed in school?: college Interpreter Needed?: No Diabetes:  Is the patient diabetic?  Yes  If diabetic, was a CBG obtained today?  No  Did the patient bring in their glucometer from home?  No phone visit How often do you monitor your CBG's? Continuous glucose monitor.   Financial Strains and Diabetes Management:  Are you having any financial strains with the device, your supplies or your medication? No .  Does the patient want to be seen by Chronic Care Management for management of their diabetes?  No  Would the patient like to be referred to a Nutritionist or for Diabetic Management?  No   Diabetic Exams:  Diabetic Eye Exam: Completed 12/30/2019.  Diabetic Foot Exam:  Pt has been advised about the importance in completing this exam. To be completed by PCP    Information entered by :: Caroleen Hamman LPN   Activities of Daily Living In your present state of health, do you have any difficulty performing the following activities: 01/14/2020  Hearing? N  Vision? N  Difficulty concentrating or making decisions? N  Walking or climbing stairs? N  Dressing or bathing? N  Doing errands, shopping? N  Preparing Food and eating ? N  Using the Toilet? N  In the past six months, have you accidently leaked urine? N  Do you have problems with loss of bowel control? N  Managing your Medications? N  Managing your Finances? N  Housekeeping or managing your Housekeeping? N  Some recent data might be hidden    Patient Care Team: Ma Hillock, DO as PCP - General (Family Medicine) Marlou Sa, Tonna Corner, MD as Consulting Physician (Orthopedic Surgery) Philemon Kingdom, MD as Consulting Physician (Internal Medicine) Mcarthur Rossetti, MD as Consulting Physician (Orthopedic Surgery) Bo Merino, MD as Consulting Physician (Rheumatology) Volanda Napoleon, MD as Consulting Physician (  Oncology) Magnus Sinning, MD as Consulting Physician (Physical Medicine and Rehabilitation) Dermatology, [facility], [place], [place] as Consulting Physician (Podiatry)  Indicate any recent Medical Services you may have received from other than [facility] providers in the past year (date may be approximate).     Assessment:   This is a routine wellness examination for Govan.  Hearing/Vision screen  Hearing Screening   125Hz  250Hz  500Hz  1000Hz  2000Hz  3000Hz  4000Hz  6000Hz  8000Hz   Right ear:           Left ear:           Comments: No issues  Vision Screening Comments: Wears glasses  Dietary issues and exercise activities discussed: Current Exercise Habits: The patient does not participate in regular exercise at present, Exercise limited by: orthopedic condition(s)  Goals      Patient Stated   .  Patient states.  (pt-stated)      Improve nutrition, mental health (increasing social mtgs with friends) and increasing activity.       Other   .  Patient Stated      Increase social activities and drink more water.       Depression Screen PHQ 2/9 Scores 01/14/2020 09/23/2019 03/14/2019 12/03/2018 08/13/2018 02/11/2018 11/27/2017  PHQ - 2 Score 3 3 0 0 0 1 1  PHQ- 9 Score 5 9 1  - 1 2 4    Patient states she has discussed her depression symptoms with PCP & does not feel that she needs to be seen or have any changes made to her meds at this time.   Fall Risk Fall Risk  01/14/2020 12/03/2018 11/27/2017 01/08/2017 11/10/2016  Falls in the past year? 0 0 No No Yes  Comment - - - - -  Number falls in past yr: 0 0 - - 1  Comment - - - - -  Injury with Fall? 0 0 - - No  Risk for fall due to : - - - - Impaired balance/gait  Follow up Falls prevention discussed Falls  prevention discussed - - Falls prevention discussed    Any stairs in or around the home? Yes  If so, are there any without handrails? No  Home free of loose throw rugs in walkways, pet beds, electrical cords, etc? Yes  Adequate lighting in your home to reduce risk of falls? Yes   ASSISTIVE DEVICES UTILIZED TO PREVENT FALLS:  Life alert? No  Use of a cane, walker or w/c? Yes cane occasionally Grab bars in the bathroom? Yes  Shower chair or bench in shower? No  Elevated toilet seat or a handicapped toilet? No   TIMED UP AND GO:  Was the test performed? No . Phone visit   Cognitive Function:No cognitive impairment noted. MMSE - Mini Mental State Exam 12/03/2018 11/27/2017  Orientation to time 5 5  Orientation to Place 5 5  Registration 3 3  Attention/ Calculation 5 5  Recall 3 3  Language- name 2 objects 2 2  Language- repeat 1 1  Language- follow 3 step command 3 3  Language- read & follow direction 1 1  Write a sentence 1 1  Copy design 1 1  Total score 30 30        Immunizations Immunization History  Administered Date(s) Administered  . Fluad Quad(high Dose 65+) 12/03/2018  . Influenza Split 01/21/2013  . Influenza, High Dose Seasonal PF 02/04/2015, 02/09/2016, 02/11/2018  . Influenza,inj,Quad PF,6+ Mos 01/15/2014  . Influenza-Unspecified 01/26/2017  . Moderna SARS-COVID-2 Vaccination 05/24/2019, 06/21/2019  .  Pneumococcal Conjugate-13 02/04/2015  . Pneumococcal Polysaccharide-23 10/31/2013  . Tdap 02/07/2013    TDAP status: Up to date   Flu vaccine status: Due- Patient plans to get vaccine soon.  Pneumococcal vaccine status:Pneumovax-23 due. Patient plans to get vaccine with flu shot soon.  Covid-19 vaccine status: Completed vaccines  Qualifies for Shingles Vaccine? Yes   Zostavax completed No   Shingrix Completed?: No.    Education has been provided regarding the importance of this vaccine. Patient has been advised to call insurance company to determine  out of pocket expense if they have not yet received this vaccine. Advised may also receive vaccine at local pharmacy or Health Dept. Verbalized acceptance and understanding.  Screening Tests Health Maintenance  Topic Date Due  . PNA vac Low Risk Adult (2 of 2 - PPSV23) 11/01/2018  . INFLUENZA VACCINE  11/09/2019  . FOOT EXAM  11/19/2019  . HEMOGLOBIN A1C  06/28/2020  . OPHTHALMOLOGY EXAM  12/29/2020  . MAMMOGRAM  04/30/2021  . Fecal DNA (Cologuard)  12/29/2021  . TETANUS/TDAP  02/08/2023  . DEXA SCAN  Completed  . COVID-19 Vaccine  Completed  . Hepatitis C Screening  Completed    Health Maintenance  Health Maintenance Due  Topic Date Due  . PNA vac Low Risk Adult (2 of 2 - PPSV23) 11/01/2018  . INFLUENZA VACCINE  11/09/2019  . FOOT EXAM  11/19/2019    Colorectal cancer screening: Completed Cologuard 12/30/2018. Repeat every 3 years   Mammogram status: Completed 05/01/2019. Repeat every year   Bone Density Status: Patient would like to wait & have done with mammogram in January.  Lung Cancer Screening: (Low Dose CT Chest recommended if Age 8-80 years, 30 pack-year currently smoking OR have quit w/in 15years.) does not qualify.     Additional Screening:  Hepatitis C Screening:Completed 08/25/2015  Vision Screening: Recommended annual ophthalmology exams for early detection of glaucoma and other disorders of the eye. Is the patient up to date with their annual eye exam?  Yes  Who is the provider or what is the name of the office in which the patient attends annual eye exams? [facility]   Dental Screening: Recommended annual dental exams for proper oral hygiene  Community Resource Referral / Chronic Care Management: CRR required this visit?  No   CCM required this visit?  No      Plan:     I have personally reviewed and noted the following in the patient's chart:   . Medical and social history . Use of alcohol, tobacco or illicit drugs  . Current  medications and supplements . Functional ability and status . Nutritional status . Physical activity . Advanced directives . List of other physicians . Hospitalizations, surgeries, and ER visits in previous 12 months . Vitals . Screenings to include cognitive, depression, and falls . Referrals and appointments  In addition, I have reviewed and discussed with patient certain preventive protocols, quality metrics, and best practice recommendations. A written personalized care plan for preventive services as well as general preventive health recommendations were provided to patient.    Due to this being a telephonic visit, the after visit summary with patients personalized plan was offered to patient via mail or my-chart. Patient would like to access on my-chart.   Marta Antu, LPN   01/10/7252  Nurse Health Advisor  Nurse Notes: None

## 2020-01-14 ENCOUNTER — Ambulatory Visit
Admission: RE | Admit: 2020-01-14 | Discharge: 2020-01-14 | Disposition: A | Discharge status: Home / Self Care | Payer: Medicare Other | Source: Ambulatory Visit | Attending: Physical Medicine and Rehabilitation | Admitting: Physical Medicine and Rehabilitation

## 2020-01-14 ENCOUNTER — Telehealth: Payer: Self-pay

## 2020-01-14 ENCOUNTER — Ambulatory Visit (INDEPENDENT_AMBULATORY_CARE_PROVIDER_SITE_OTHER): Payer: Medicare Other

## 2020-01-14 ENCOUNTER — Other Ambulatory Visit: Payer: Self-pay

## 2020-01-14 VITALS — Ht 64.0 in | Wt 155.0 lb

## 2020-01-14 DIAGNOSIS — M25551 Pain in right hip: Secondary | ICD-10-CM

## 2020-01-14 DIAGNOSIS — Z0000 Encounter for general adult medical examination without abnormal findings: Secondary | ICD-10-CM

## 2020-01-14 NOTE — Telephone Encounter (Signed)
Patient called to see if Dr. Estanislado Pandy could write her prescription of Zolpidem for 12 months.  Patient states [facility] told her it would be less expensive if the prescription was written for a 12 month supply.

## 2020-01-14 NOTE — Telephone Encounter (Signed)
Patient advised Dr. Estanislado Pandy will not write an Ambien prescription for a 12 month supply. Patient advised Dr. Estanislado Pandy will fill it for a 30 day supply as she is now. Patient asked about having to pay for it for 3 months out of the year. Patient advised she will need to talk to the pharmacy and her insurance company regarding that. Patient expressed understanding.

## 2020-01-14 NOTE — Patient Instructions (Signed)
Priscilla [name] , Thank you for taking time to complete your Medicare Wellness Visit. I appreciate your ongoing commitment to your health goals. Please review the following plan we discussed and let me know if I can assist you in the future.   Screening recommendations/referrals: Colonoscopy: Completed Cologuard 12/30/2018-Due 12/29/2021 Mammogram: Completed 05/01/2019- Due 04/30/2020 Bone Density: Per our discussion today, you would like to have done with mammogram in January. Recommended yearly ophthalmology/optometry visit for glaucoma screening and checkup Recommended yearly dental visit for hygiene and checkup  Vaccinations: Influenza vaccine: Due-May obtain at our office or your local pharmacy. Pneumococcal vaccine: Pneumovax-23 due-May obtain at our office or your local pharmacy. Tdap vaccine: Up to Date-Due-02/08/2023 Shingles vaccine: Discuss with pharmacy Covid-19:Completed vaccines  Advanced directives: Information mailed today.  Conditions/risks identified: See problem list  Next appointment: Follow up in one year for your annual wellness visit 01/19/2021 @ 9:45   Preventive Care 70 Years and Older, Female Preventive care refers to lifestyle choices and visits with your health care provider that can promote health and wellness. What does preventive care include?  A yearly physical exam. This is also called an annual well check.  Dental exams once or twice a year.  Routine eye exams. Ask your health care provider how often you should have your eyes checked.  Personal lifestyle choices, including:  Daily care of your teeth and gums.  Regular physical activity.  Eating a healthy diet.  Avoiding tobacco and drug use.  Limiting alcohol use.  Practicing safe sex.  Taking low-dose aspirin every day.  Taking vitamin and mineral supplements as recommended by your health care provider. What happens during an annual well check? The services and screenings done by your  health care provider during your annual well check will depend on your age, overall health, lifestyle risk factors, and family history of disease. Counseling  Your health care provider may ask you questions about your:  Alcohol use.  Tobacco use.  Drug use.  Emotional well-being.  Home and relationship well-being.  Sexual activity.  Eating habits.  History of falls.  Memory and ability to understand (cognition).  Work and work Statistician.  Reproductive health. Screening  You may have the following tests or measurements:  Height, weight, and BMI.  Blood pressure.  Lipid and cholesterol levels. These may be checked every 5 years, or more frequently if you are over 70 years old.  Skin check.  Lung cancer screening. You may have this screening every year starting at age 70 if you have a 30-pack-year history of smoking and currently smoke or have quit within the past 15 years.  Fecal occult blood test (FOBT) of the stool. You may have this test every year starting at age 70.  Flexible sigmoidoscopy or colonoscopy. You may have a sigmoidoscopy every 5 years or a colonoscopy every 10 years starting at age 70.  Hepatitis C blood test.  Hepatitis B blood test.  Sexually transmitted disease (STD) testing.  Diabetes screening. This is done by checking your blood sugar (glucose) after you have not eaten for a while (fasting). You may have this done every 1-3 years.  Bone density scan. This is done to screen for osteoporosis. You may have this done starting at age 70.  Mammogram. This may be done every 1-2 years. Talk to your health care provider about how often you should have regular mammograms. Talk with your health care provider about your test results, treatment options, and if necessary, the need for more tests.  Vaccines  Your health care provider may recommend certain vaccines, such as:  Influenza vaccine. This is recommended every year.  Tetanus, diphtheria, and  acellular pertussis (Tdap, Td) vaccine. You may need a Td booster every 10 years.  Zoster vaccine. You may need this after age 70.  Pneumococcal 13-valent conjugate (PCV13) vaccine. One dose is recommended after age 70.  Pneumococcal polysaccharide (PPSV23) vaccine. One dose is recommended after age 70. Talk to your health care provider about which screenings and vaccines you need and how often you need them. This information is not intended to replace advice given to you by your health care provider. Make sure you discuss any questions you have with your health care provider. Document Released: 04/23/2015 Document Revised: 12/15/2015 Document Reviewed: 01/26/2015 Elsevier Interactive Patient Education  2017 [place] Prevention in the Home Falls can cause injuries. They can happen to people of all ages. There are many things you can do to make your home safe and to help prevent falls. What can I do on the outside of my home?  Regularly fix the edges of walkways and driveways and fix any cracks.  Remove anything that might make you trip as you walk through a door, such as a raised step or threshold.  Trim any bushes or trees on the path to your home.  Use bright outdoor lighting.  Clear any walking paths of anything that might make someone trip, such as rocks or tools.  Regularly check to see if handrails are loose or broken. Make sure that both sides of any steps have handrails.  Any raised decks and porches should have guardrails on the edges.  Have any leaves, snow, or ice cleared regularly.  Use sand or salt on walking paths during winter.  Clean up any spills in your garage right away. This includes oil or grease spills. What can I do in the bathroom?  Use night lights.  Install grab bars by the toilet and in the tub and shower. Do not use towel bars as grab bars.  Use non-skid mats or decals in the tub or shower.  If you need to sit down in the shower, use  a plastic, non-slip stool.  Keep the floor dry. Clean up any water that spills on the floor as soon as it happens.  Remove soap buildup in the tub or shower regularly.  Attach bath mats securely with double-sided non-slip rug tape.  Do not have throw rugs and other things on the floor that can make you trip. What can I do in the bedroom?  Use night lights.  Make sure that you have a light by your bed that is easy to reach.  Do not use any sheets or blankets that are too big for your bed. They should not hang down onto the floor.  Have a firm chair that has side arms. You can use this for support while you get dressed.  Do not have throw rugs and other things on the floor that can make you trip. What can I do in the kitchen?  Clean up any spills right away.  Avoid walking on wet floors.  Keep items that you use a lot in easy-to-reach places.  If you need to reach something above you, use a strong step stool that has a grab bar.  Keep electrical cords out of the way.  Do not use floor polish or wax that makes floors slippery. If you must use wax, use non-skid floor wax.  Do not have throw rugs and other things on the floor that can make you trip. What can I do with my stairs?  Do not leave any items on the stairs.  Make sure that there are handrails on both sides of the stairs and use them. Fix handrails that are broken or loose. Make sure that handrails are as long as the stairways.  Check any carpeting to make sure that it is firmly attached to the stairs. Fix any carpet that is loose or worn.  Avoid having throw rugs at the top or bottom of the stairs. If you do have throw rugs, attach them to the floor with carpet tape.  Make sure that you have a light switch at the top of the stairs and the bottom of the stairs. If you do not have them, ask someone to add them for you. What else can I do to help prevent falls?  Wear shoes that:  Do not have high heels.  Have  rubber bottoms.  Are comfortable and fit you well.  Are closed at the toe. Do not wear sandals.  If you use a stepladder:  Make sure that it is fully opened. Do not climb a closed stepladder.  Make sure that both sides of the stepladder are locked into place.  Ask someone to hold it for you, if possible.  Clearly mark and make sure that you can see:  Any grab bars or handrails.  First and last steps.  Where the edge of each step is.  Use tools that help you move around (mobility aids) if they are needed. These include:  Canes.  Walkers.  Scooters.  Crutches.  Turn on the lights when you go into a dark area. Replace any light bulbs as soon as they burn out.  Set up your furniture so you have a clear path. Avoid moving your furniture around.  If any of your floors are uneven, fix them.  If there are any pets around you, be aware of where they are.  Review your medicines with your doctor. Some medicines can make you feel dizzy. This can increase your chance of falling. Ask your doctor what other things that you can do to help prevent falls. This information is not intended to replace advice given to you by your health care provider. Make sure you discuss any questions you have with your health care provider. Document Released: 01/21/2009 Document Revised: 09/02/2015 Document Reviewed: 05/01/2014 Elsevier Interactive Patient Education  2017 [employer].

## 2020-01-16 ENCOUNTER — Telehealth: Payer: Self-pay | Admitting: Physical Medicine and Rehabilitation

## 2020-01-16 ENCOUNTER — Telehealth: Payer: Self-pay

## 2020-01-16 DIAGNOSIS — E2839 Other primary ovarian failure: Secondary | ICD-10-CM

## 2020-01-16 NOTE — Telephone Encounter (Signed)
-----   Message from Magnus Sinning, MD sent at 01/14/2020 11:44 AM EDT ----- Regarding: hip MRI f/up MRI of the hip is pretty unremarkable except there is some bursitis of the trochanter area.  Options include greater trochanteric bursa injection with fluoroscopic guidance versus radiofrequency ablation of the lower lumbar spine please see her last note.

## 2020-01-16 NOTE — Telephone Encounter (Signed)
Last Dexa on 08/30/2015. Was instructed to continue daily supplements of Ca/Vit d.

## 2020-01-16 NOTE — Telephone Encounter (Signed)
Called patient to advise per Dr. Romona Curls note regarding MRI of right hip. She does want to try a bursa injection. Scheduled for 10/28.

## 2020-01-16 NOTE — Telephone Encounter (Signed)
Patient needs order for Bone Scan, she specifically asked to make it was entered so that Medicare will pay for it.  She is scheduled to have mammogram done in late January. Appt for bone scan on the same day and same place.  Patient is aware of Dr. Raoul Pitch not in office and will not return to office until 01/21/20.  No call back needed.

## 2020-01-20 ENCOUNTER — Other Ambulatory Visit: Payer: Self-pay

## 2020-01-20 ENCOUNTER — Encounter: Payer: Self-pay | Admitting: Podiatry

## 2020-01-20 ENCOUNTER — Ambulatory Visit (INDEPENDENT_AMBULATORY_CARE_PROVIDER_SITE_OTHER): Payer: Medicare Other | Admitting: Podiatry

## 2020-01-20 DIAGNOSIS — Q828 Other specified congenital malformations of skin: Secondary | ICD-10-CM | POA: Diagnosis not present

## 2020-01-20 DIAGNOSIS — L603 Nail dystrophy: Secondary | ICD-10-CM

## 2020-01-20 MED ORDER — TERBINAFINE HCL 250 MG PO TABS: 250.0000 mg | ORAL_TABLET | Freq: Every day | ORAL | 0 refills | Status: DC

## 2020-01-20 NOTE — Progress Notes (Signed)
She presents today for follow-up of her fungus.  She states that she has not been back to be treated with Lamisil for quite some time because of Covid and being sick and having canceled.  She is also complaining of a painful area between the third and fourth toes of the left foot.  She is also stating that her rheumatologist has discontinued her gabapentin so that she can take Ambien.  Objective: Vital signs are stable she is alert and oriented x3 she still has onychomycosis.  Pulses remain palpable no open lesions or wounds still has a reactive hyperkeratotic lesion lateral aspect third toe left  Assessment: Pain in limb secondary to onychomycosis long-term therapy with Lamisil will begin again today.  Debrided reactive hyperkeratotic tissue to toe.  Plan: Debrided to hyperkeratotic tissue of the toe and start her back on Lamisil 250 mg tablets #91 p.o. daily

## 2020-01-29 ENCOUNTER — Ambulatory Visit: Payer: Medicare Other | Admitting: Orthopedic Surgery

## 2020-01-29 ENCOUNTER — Ambulatory Visit (INDEPENDENT_AMBULATORY_CARE_PROVIDER_SITE_OTHER): Payer: Medicare Other

## 2020-01-29 ENCOUNTER — Other Ambulatory Visit: Payer: Self-pay

## 2020-01-29 DIAGNOSIS — Z23 Encounter for immunization: Secondary | ICD-10-CM | POA: Diagnosis not present

## 2020-01-29 NOTE — Addendum Note (Signed)
Addended by: Octaviano Glow on: 01/29/2020 10:26 AM   Modules accepted: Orders

## 2020-02-02 ENCOUNTER — Telehealth: Payer: Self-pay | Admitting: Podiatry

## 2020-02-02 MED ORDER — TERBINAFINE HCL 250 MG PO TABS
250.0000 mg | ORAL_TABLET | Freq: Every day | ORAL | 0 refills | Status: DC
Start: 2020-02-02 — End: 2020-05-25

## 2020-02-02 NOTE — Addendum Note (Signed)
Addended by: Rip Harbour on: 02/02/2020 05:08 PM   Modules accepted: Orders

## 2020-02-02 NOTE — Telephone Encounter (Signed)
Initial 90 day Rx sent over to [facility]- made changes and sent 90 day lamisil to [facility] on [address] as requested

## 2020-02-02 NOTE — Telephone Encounter (Signed)
Pt called stating she would like the terbinafine (LAMISIL) 250 MG to be sent to the [place]. Specific [facility] is unspecified. I left pt a vm to see which [facility] she would like it to be sent to. Will update once she calls back.

## 2020-02-02 NOTE — Telephone Encounter (Signed)
Check on the lamisil please thanks

## 2020-02-05 ENCOUNTER — Ambulatory Visit: Payer: Medicare Other

## 2020-02-05 ENCOUNTER — Encounter: Payer: Self-pay | Admitting: Physical Medicine and Rehabilitation

## 2020-02-05 ENCOUNTER — Ambulatory Visit: Payer: Self-pay

## 2020-02-05 ENCOUNTER — Ambulatory Visit (INDEPENDENT_AMBULATORY_CARE_PROVIDER_SITE_OTHER): Payer: Medicare Other | Admitting: Physical Medicine and Rehabilitation

## 2020-02-05 ENCOUNTER — Other Ambulatory Visit: Payer: Self-pay

## 2020-02-05 DIAGNOSIS — M47816 Spondylosis without myelopathy or radiculopathy, lumbar region: Secondary | ICD-10-CM

## 2020-02-05 DIAGNOSIS — M25551 Pain in right hip: Secondary | ICD-10-CM | POA: Diagnosis not present

## 2020-02-05 DIAGNOSIS — M4316 Spondylolisthesis, lumbar region: Secondary | ICD-10-CM | POA: Diagnosis not present

## 2020-02-05 DIAGNOSIS — M48061 Spinal stenosis, lumbar region without neurogenic claudication: Secondary | ICD-10-CM

## 2020-02-05 DIAGNOSIS — L4050 Arthropathic psoriasis, unspecified: Secondary | ICD-10-CM | POA: Diagnosis not present

## 2020-02-05 NOTE — Progress Notes (Signed)
   Numeric Pain Rating Scale and Functional Assessment Average Pain 4   In the last MONTH (on 0-10 scale) has pain interfered with the following?  1. General activity like being  able to carry out your everyday physical activities such as walking, climbing stairs, carrying groceries, or moving a chair?  Rating(7)

## 2020-02-06 ENCOUNTER — Encounter: Payer: Self-pay | Admitting: Physical Medicine and Rehabilitation

## 2020-02-09 DIAGNOSIS — Z23 Encounter for immunization: Secondary | ICD-10-CM | POA: Diagnosis not present

## 2020-02-11 ENCOUNTER — Telehealth: Payer: Self-pay | Admitting: Internal Medicine

## 2020-02-11 ENCOUNTER — Other Ambulatory Visit: Payer: Self-pay

## 2020-02-11 MED ORDER — FREESTYLE LIBRE 2 SENSOR MISC: 3 refills | Status: DC

## 2020-02-11 NOTE — Progress Notes (Deleted)
Office Visit Note  Patient: Priscilla [name]             Date of Birth: September 13, 1950           MRN: [id number]             PCP: Ma Hillock, DO Referring: Ma Hillock, DO Visit Date: 02/25/2020 Occupation: @GUAROCC @  Subjective:  No chief complaint on file.   History of Present Illness: Priscilla [name] is a 69 y.o. female ***   Activities of Daily Living:  Patient reports morning stiffness for *** {minute/hour:19697}.   Patient {ACTIONS;DENIES/REPORTS:21021675::"Denies"} nocturnal pain.  Difficulty dressing/grooming: {ACTIONS;DENIES/REPORTS:21021675::"Denies"} Difficulty climbing stairs: {ACTIONS;DENIES/REPORTS:21021675::"Denies"} Difficulty getting out of chair: {ACTIONS;DENIES/REPORTS:21021675::"Denies"} Difficulty using hands for taps, buttons, cutlery, and/or writing: {ACTIONS;DENIES/REPORTS:21021675::"Denies"}  No Rheumatology ROS completed.   PMFS History:  Patient Active Problem List   Diagnosis Date Noted  . Hip pain 11/27/2019  . Type 2 diabetes mellitus with retinopathy, with long-term current use of insulin ([place]) 05/17/2018  . Overweight (BMI 25.0-29.9) 02/11/2018  . Vitamin D deficiency 10/13/2016  . 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 Iron deficiency anemia 04/28/2016  . Other insomnia 03/14/2016  . DDD (degenerative disc disease), cervical 03/14/2016  . Spondylosis without myelopathy or radiculopathy, lumbar region 01/31/2016  . Medicare annual wellness visit, initial 08/06/2015  . Anxiety and depression 06/25/2014  . Psoriatic arthritis - sees rheumatologist 02/20/2014  . Asthma 12/14/2008  . Diabetic peripheral neuropathy associated with type 2 diabetes mellitus ([place]) 08/06/2008  . Hyperlipemia 11/12/2006  . Essential hypertension 11/12/2006    Past Medical History:  Diagnosis Date  . Anemia 12/14/2008  . Anxiety and depression 12/14/2008  . Asthma 11/12/2006  . CAD (coronary artery disease)  12/14/2008   hx transluminal coronary angioplasty  . Cataract   . Depression   . Diabetes mellitus with neuropathy ([place]) 12/14/2008  . Fibromyalgia 01/04/2007   sees rheuamtology  . 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 Hyperlipemia 01/21/2010  . Hypertension 12/14/2008  . Leg edema 12/14/2008  . 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 OSA (obstructive sleep apnea) 08/06/2008  . Osteoarthritis 12/14/2008  . Palpitations 11/12/2006   hx sinus tachy  . Psoriatic arthritis [facility])    sees rheumatology  . Rotator cuff injury     Family History  Problem Relation Age of Onset  . Arthritis Mother   . Alzheimer's disease Mother   . Stroke Father   . Alcohol abuse Father   . Diabetes Father   . Heart disease Father   . Early death Father 71  . Diabetes Sister   . Arthritis Sister   . COPD Sister   . Arthritis Maternal Aunt   . Diabetes Maternal Aunt   . Early death Maternal Aunt 61  . Diabetes Maternal Uncle   . Cancer Paternal Aunt   . COPD Paternal 27   . Heart disease Paternal Aunt   . Heart disease Paternal Uncle   . Alcohol abuse Paternal Grandfather   . Heart disease Paternal Grandfather   . Early death Paternal Grandfather 16  . Stroke Paternal Grandfather   . Hypertension Son   . Autoimmune disease Daughter   . Breast cancer Neg Hx    Past Surgical History:  Procedure Laterality Date  . ABDOMINAL HYSTERECTOMY    . CHOLECYSTECTOMY    . LAPAROSCOPIC GASTRIC BANDING    . PTCA    . WRIST SURGERY     LEFT  Social History   Social History Narrative   Married, [name]. 2 children name Abe People and Anderson Malta.   Some college, retired Engineer, manufacturing.   Drinks caffeine.   Take a daily vitamin.   Wears her seatbelt, exercises 3 times a week.   Smoke detector in the home.   Firearms in the home.   Feels safe in her relationships.   Immunization History  Administered Date(s) Administered  . Fluad Quad(high Dose 65+) 12/03/2018, 01/29/2020  . Influenza Split  01/21/2013  . Influenza, High Dose Seasonal PF 02/04/2015, 02/09/2016, 02/11/2018  . Influenza,inj,Quad PF,6+ Mos 01/15/2014  . Influenza-Unspecified 01/26/2017  . Moderna SARS-COVID-2 Vaccination 05/24/2019, 06/21/2019  . Pneumococcal Conjugate-13 02/04/2015  . Pneumococcal Polysaccharide-23 10/31/2013, 01/29/2020  . Tdap 02/07/2013     Objective: Vital Signs: LMP  (LMP Unknown)    Physical Exam   Musculoskeletal Exam: ***  CDAI Exam: CDAI Score: -- Patient Global: --; Provider Global: -- Swollen: --; Tender: -- Joint Exam 02/25/2020   No joint exam has been documented for this visit   There is currently no information documented on the homunculus. Go to the Rheumatology activity and complete the homunculus joint exam.  Investigation: No additional findings.  Imaging: MR HIP RIGHT WO CONTRAST  Result Date: 01/14/2020 CLINICAL DATA:  Right hip pain for 10 months.  No known injury. EXAM: MR OF THE RIGHT HIP WITHOUT CONTRAST TECHNIQUE: Multiplanar, multisequence MR imaging was performed. No intravenous contrast was administered. COMPARISON:  Plain films right hip 01/23/2016. FINDINGS: Bones: No fracture, stress change or worrisome lesion is identified. There is no subchondral edema or cyst formation about either hip. No avascular necrosis of the femoral heads. Marrow signal is diffusely heterogeneous with an appearance most suggestive reactivation as can be seen in obesity. Articular cartilage and labrum Articular cartilage:  Thinned and degenerated without focal defect. Labrum:  Intact. Joint or bursal effusion Joint effusion:  Negative. Bursae: There is a small volume of fluid in the right trochanteric bursa. Muscles and tendons Muscles and tendons: Intact. Mild gluteus medius and right hamstring tendinosis noted. Other findings Miscellaneous: Imaged intrapelvic contents demonstrate postoperative change of hysterectomy. No acute or focal abnormality is identified. IMPRESSION: Mild  appearing right hip osteoarthritis. Small volume of fluid in the right trochanteric bursa compatible with bursitis. Mild appearing right gluteus medius and hamstring tendinosis without tear. Electronically Signed   By: Inge Rise M.D.   On: 01/14/2020 10:43   XR C-ARM NO REPORT  Result Date: 02/05/2020 Please see Notes tab for imaging impression.   Recent Labs: Lab Results  Component Value Date   WBC 7.6 11/19/2019   HGB 13.0 11/19/2019   PLT 336 11/19/2019   NA 140 11/19/2019   K 4.9 11/19/2019   CL 103 11/19/2019   CO2 30 11/19/2019   GLUCOSE 160 (H) 11/19/2019   BUN 20 11/19/2019   CREATININE 0.84 11/19/2019   BILITOT 0.3 11/19/2019   ALKPHOS 47 11/19/2019   AST 13 (L) 11/19/2019   ALT 12 11/19/2019   PROT 6.5 11/19/2019   ALBUMIN 4.3 11/19/2019   CALCIUM 9.6 11/19/2019   GFRAA >60 11/19/2019    Speciality Comments: No specialty comments available.  Procedures:  No procedures performed Allergies: Patient has no known allergies.   Assessment / Plan:     Visit Diagnoses: No diagnosis found.  Orders: No orders of the defined types were placed in this encounter.  No orders of the defined types were placed in this encounter.   Face-to-face time  spent with patient was *** minutes. Greater than 50% of time was spent in counseling and coordination of care.  Follow-Up Instructions: No follow-ups on file.   Earnestine Mealing, CMA  Note - This record has been created using Editor, commissioning.  Chart creation errors have been sought, but may not always  have been located. Such creation errors do not reflect on  the standard of medical care.

## 2020-02-11 NOTE — Telephone Encounter (Signed)
RX mailed to this facility-  Since unable to get anyone on the phone for a fax number., and not on website.

## 2020-02-11 NOTE — Telephone Encounter (Signed)
Patient called to request a new RX with refills for [employer] be sent to [facility] in [place] (supplier), Ph# [phone], Web address: [email or web address]

## 2020-02-13 ENCOUNTER — Telehealth (INDEPENDENT_AMBULATORY_CARE_PROVIDER_SITE_OTHER): Payer: Medicare Other | Admitting: Family Medicine

## 2020-02-13 ENCOUNTER — Other Ambulatory Visit: Payer: Self-pay

## 2020-02-13 ENCOUNTER — Encounter: Payer: Self-pay | Admitting: Family Medicine

## 2020-02-13 VITALS — BP 100/59 | HR 87 | Temp 98.2°F

## 2020-02-13 DIAGNOSIS — R112 Nausea with vomiting, unspecified: Secondary | ICD-10-CM

## 2020-02-13 DIAGNOSIS — E860 Dehydration: Secondary | ICD-10-CM | POA: Diagnosis not present

## 2020-02-13 DIAGNOSIS — R197 Diarrhea, unspecified: Secondary | ICD-10-CM | POA: Diagnosis not present

## 2020-02-13 DIAGNOSIS — R5081 Fever presenting with conditions classified elsewhere: Secondary | ICD-10-CM | POA: Diagnosis not present

## 2020-02-13 MED ORDER — PROMETHAZINE HCL 12.5 MG PO TABS: ORAL_TABLET | ORAL | 0 refills | Status: DC

## 2020-02-13 NOTE — Progress Notes (Signed)
Virtual Visit via Video Note  I connected with pt on 02/13/20 at  4:00 PM EDT by a video enabled telemedicine application and verified that I am speaking with the correct person using two identifiers.  Location patient: home Location provider:work or home office Persons participating in the virtual visit: patient, provider  I discussed the limitations of evaluation and management by telemedicine and the availability of in person appointments. The patient expressed understanding and agreed to proceed.  Telemedicine visit is a necessity given the COVID-19 restrictions in place at the current time.  HPI: 69 y/o WF being seen today for diarrhea. Acute onset 3 d/a diarrhea, fatigue, lightheaded, fever, bad HA.  Some vomiting the first day but nothing since.  Drinking gatorade/water.  Not taking any antidiarrhea meds.  Some brief crampy abd pains. Still 10-12 bm's per day, small volume w/out blood or mucous. Lots of sweating, feeling very tired. Her glucoses have been up some, into high 100s.  Husband with same sx's and got covid test: NEG Pt did not get tested. No other infectious contacts. No recent abx.  ROS: no fevers, no CP, no SOB, no wheezing, no cough, no dizziness no rashes, no melena/hematochezia.  No polyuria or polydipsia. No focal weakness, paresthesias, or tremors.  No acute vision or hearing abnormalities. No palpitations.     Past Medical History:  Diagnosis Date  . Anemia 12/14/2008  . Anxiety and depression 12/14/2008  . Asthma 11/12/2006  . CAD (coronary artery disease) 12/14/2008   hx transluminal coronary angioplasty  . Cataract   . Depression   . Diabetes mellitus with neuropathy ([place]) 12/14/2008  . Fibromyalgia 01/04/2007   sees rheuamtology  . 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 Hyperlipemia 01/21/2010  . Hypertension 12/14/2008  . Leg edema 12/14/2008  . 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 OSA (obstructive sleep apnea) 08/06/2008  . Osteoarthritis 12/14/2008   . Palpitations 11/12/2006   hx sinus tachy  . Psoriatic arthritis [facility])    sees rheumatology  . Rotator cuff injury     Past Surgical History:  Procedure Laterality Date  . ABDOMINAL HYSTERECTOMY    . CHOLECYSTECTOMY    . LAPAROSCOPIC GASTRIC BANDING    . PTCA    . WRIST SURGERY     LEFT     Current Outpatient Medications:  .  aspirin 81 MG tablet, Take 81 mg by mouth daily.  , Disp: , Rfl:  .  Biotin 5000 MCG TABS, Take 5,000 mcg by mouth daily. , Disp: , Rfl:  .  calcium-vitamin D 250-100 MG-UNIT per tablet, Take 1 tablet by mouth daily. , Disp: , Rfl:  .  Continuous Blood Gluc Sensor (FREESTYLE LIBRE 2 SENSOR) MISC, Use sensors with CGM to check sugars., Disp: 6 each, Rfl: 3 .  Cyanocobalamin (VITAMIN B12 PO), Place under the tongue., Disp: , Rfl:  .  DULoxetine (CYMBALTA) 60 MG capsule, Take 1 capsule (60 mg total) by mouth daily. Take with 30 mg to equal 90 mg daily., Disp: 90 capsule, Rfl: 1 .  glucose blood (ONETOUCH VERIO) test strip, USE TO TEST BLOOD SUGAR TWICE A DAY DX: E11.319, Disp: 200 each, Rfl: 0 .  insulin aspart (NOVOLOG FLEXPEN) 100 UNIT/ML FlexPen, Inject 3-8 Units into the skin 3 (three) times daily with meals., Disp: 30 mL, Rfl: 3 .  insulin detemir (LEVEMIR) 100 UNIT/ML injection, Inject 0.15 mLs (15 Units total) into the skin at bedtime., Disp: 30 mL, Rfl: 3 .  Insulin Syringe-Needle U-100 (B-D  INS SYR MICROFINE 1CC/27G) 27G X 5/8" 1 ML MISC, Use to inject levemir once daily DX: E11.319, Disp: 120 each, Rfl: 6 .  lisinopril (ZESTRIL) 2.5 MG tablet, Take 1 tablet (2.5 mg total) by mouth daily., Disp: 30 tablet, Rfl: 5 .  metFORMIN (GLUCOPHAGE) 1000 MG tablet, TAKE 1 TABLET BY MOUTH TWICE DAILY WITH MEALS, Disp: 180 tablet, Rfl: 3 .  metoprolol tartrate (LOPRESSOR) 25 MG tablet, Take 1 tablet (25 mg total) by mouth 2 (two) times daily., Disp: 60 tablet, Rfl: 5 .  Multiple Vitamin (MULTIVITAMIN) tablet, Take 1 tablet by mouth daily. , Disp: , Rfl:  .   pravastatin (PRAVACHOL) 20 MG tablet, Take 1 tablet (20 mg total) by mouth daily., Disp: 30 tablet, Rfl: 11 .  sertraline (ZOLOFT) 50 MG tablet, Take 1 tablet (50 mg total) by mouth daily., Disp: 30 tablet, Rfl: 5 .  terbinafine (LAMISIL) 250 MG tablet, Take 1 tablet (250 mg total) by mouth daily., Disp: 90 tablet, Rfl: 0 .  Vitamin D, Ergocalciferol, (DRISDOL) 1.25 MG (50000 UNIT) CAPS capsule, TAKE 1 CAPSULE BY MOUTH EVERY 7 DAYS, Disp: 4 capsule, Rfl: 6 .  zolpidem (AMBIEN) 5 MG tablet, Take 1 tablet (5 mg total) by mouth at bedtime as needed for sleep., Disp: 30 tablet, Rfl: 0 .  albuterol (PROVENTIL HFA;VENTOLIN HFA) 108 (90 Base) MCG/ACT inhaler, Inhale 2 puffs into the lungs every 6 (six) hours as needed for wheezing or shortness of breath. (Patient not taking: Reported on 02/13/2020), Disp: 1 Inhaler, Rfl: 1  EXAM:  VITALS per patient if applicable:  Vitals with BMI 02/13/2020 01/14/2020 12/30/2019  Height - 5\' 4"  5\' 4"   Weight - 155 lbs 155 lbs  BMI - 76.16 07.37  Systolic 106 - 269  Diastolic 59 - 70  Pulse 87 - 68     GENERAL: alert, oriented, appears well and in no acute distress  HEENT: atraumatic, conjunttiva clear, no obvious abnormalities on inspection of external nose and ears  NECK: normal movements of the head and neck  LUNGS: on inspection no signs of respiratory distress, breathing rate appears normal, no obvious gross SOB, gasping or wheezing  CV: no obvious cyanosis  MS: moves all visible extremities without noticeable abnormality  PSYCH/NEURO: pleasant and cooperative, no obvious depression or anxiety, speech and thought processing grossly intact  LABS: none today    Chemistry      Component Value Date/Time   NA 140 11/19/2019 1443   NA 141 10/13/2016 1503   K 4.9 11/19/2019 1443   K 4.9 10/13/2016 1503   CL 103 11/19/2019 1443   CL 103 10/13/2016 1503   CO2 30 11/19/2019 1443   CO2 28 10/13/2016 1503   BUN 20 11/19/2019 1443   BUN 16 10/13/2016  1503   CREATININE 0.84 11/19/2019 1443   CREATININE 0.90 10/13/2016 1503      Component Value Date/Time   CALCIUM 9.6 11/19/2019 1443   CALCIUM 9.5 10/13/2016 1503   ALKPHOS 47 11/19/2019 1443   ALKPHOS 72 10/13/2016 1503   AST 13 (L) 11/19/2019 1443   ALT 12 11/19/2019 1443   BILITOT 0.3 11/19/2019 1443     Lab Results  Component Value Date   WBC 7.6 11/19/2019   HGB 13.0 11/19/2019   HCT 40.6 11/19/2019   MCV 90.8 11/19/2019   PLT 336 11/19/2019   Lab Results  Component Value Date   HGBA1C 6.3 (A) 12/30/2019   Lab Results  Component Value Date   TSH 1.97  03/19/2019    ASSESSMENT AND PLAN:  Discussed the following assessment and plan:  Acute gastroenteritis, mild dehydration. Discussed use of imodium, 1 dose bid prn. Rx'd phenergan 12.5mg , 1-2 q6h prn nausea, #10, no RF. Continue oral rehydration, bland diet. Hold metformin, terbinafine, and pravachol until feeling well again. Permissive hyperglycemia at this point, as long as <200----continue her chronic/routine insulin dosing.  -we discussed possible serious and likely etiologies, options for evaluation and workup, limitations of telemedicine visit vs in person visit, treatment, treatment risks and precautions. Pt prefers to treat via telemedicine empirically rather than in person at this moment.    I discussed the assessment and treatment plan with the patient. The patient was provided an opportunity to ask questions and all were answered. The patient agreed with the plan and demonstrated an understanding of the instructions.   F/u: if not improving signif in 2 d  Signed:  Crissie Sickles, MD           02/13/2020

## 2020-02-18 ENCOUNTER — Other Ambulatory Visit: Payer: Self-pay | Admitting: Rheumatology

## 2020-02-19 NOTE — Telephone Encounter (Signed)
Last visit: 09/03/2019 Next visit: 02/25/2020  Last Fill: 01/07/2020  Okay to refill Ambien?

## 2020-02-25 ENCOUNTER — Ambulatory Visit: Payer: Medicare Other | Admitting: Rheumatology

## 2020-02-25 DIAGNOSIS — M797 Fibromyalgia: Secondary | ICD-10-CM

## 2020-02-25 DIAGNOSIS — Z8669 Personal history of other diseases of the nervous system and sense organs: Secondary | ICD-10-CM

## 2020-02-25 DIAGNOSIS — M5030 Other cervical disc degeneration, unspecified cervical region: Secondary | ICD-10-CM

## 2020-02-25 DIAGNOSIS — Z862 Personal history of diseases of the blood and blood-forming organs and certain disorders involving the immune mechanism: Secondary | ICD-10-CM

## 2020-02-25 DIAGNOSIS — Z8709 Personal history of other diseases of the respiratory system: Secondary | ICD-10-CM

## 2020-02-25 DIAGNOSIS — M47816 Spondylosis without myelopathy or radiculopathy, lumbar region: Secondary | ICD-10-CM

## 2020-02-25 DIAGNOSIS — M19041 Primary osteoarthritis, right hand: Secondary | ICD-10-CM

## 2020-02-25 DIAGNOSIS — Z8639 Personal history of other endocrine, nutritional and metabolic disease: Secondary | ICD-10-CM

## 2020-02-25 DIAGNOSIS — Z9884 Bariatric surgery status: Secondary | ICD-10-CM

## 2020-02-25 DIAGNOSIS — R5383 Other fatigue: Secondary | ICD-10-CM

## 2020-02-25 DIAGNOSIS — Z8659 Personal history of other mental and behavioral disorders: Secondary | ICD-10-CM

## 2020-02-25 DIAGNOSIS — Z8739 Personal history of other diseases of the musculoskeletal system and connective tissue: Secondary | ICD-10-CM

## 2020-02-25 DIAGNOSIS — L409 Psoriasis, unspecified: Secondary | ICD-10-CM

## 2020-02-25 DIAGNOSIS — L4050 Arthropathic psoriasis, unspecified: Secondary | ICD-10-CM

## 2020-02-25 DIAGNOSIS — G4709 Other insomnia: Secondary | ICD-10-CM

## 2020-02-26 ENCOUNTER — Other Ambulatory Visit: Payer: Self-pay | Admitting: Physician Assistant

## 2020-02-26 DIAGNOSIS — F32A Depression, unspecified: Secondary | ICD-10-CM

## 2020-02-26 DIAGNOSIS — F419 Anxiety disorder, unspecified: Secondary | ICD-10-CM

## 2020-02-27 NOTE — Telephone Encounter (Signed)
Please schedule patient for a follow up visit. Patient due November 2021. Thanks!

## 2020-02-27 NOTE — Telephone Encounter (Signed)
Last visit: 09/03/2019 Next visit: due November 2021. Message sent to the front to schedule patient.  Okay to refill per Dr. Estanislado Pandy

## 2020-03-10 ENCOUNTER — Telehealth: Payer: Self-pay | Admitting: *Deleted

## 2020-03-10 NOTE — Telephone Encounter (Signed)
Please advise patient that Ambien is not approved by her insurance.  She may look at some other treatment options to her PCP.

## 2020-03-10 NOTE — Telephone Encounter (Signed)
Attempted to contact the patient and left message for patient to call the office.  

## 2020-03-10 NOTE — Telephone Encounter (Signed)
Received a fax regarding Prior Authorization from [facility] for Zolpidem. Authorization has been DENIED.

## 2020-03-11 NOTE — Progress Notes (Signed)
Office Visit Note  Patient: Priscilla [name]             Date of Birth: 02/04/1951           MRN: [id number]             PCP: Ma Hillock, DO Referring: Ma Hillock, DO Visit Date: 03/25/2020 Occupation: @GUAROCC @  Subjective:  Pain in hands and feet.   History of Present Illness: Priscilla [name] is a 69 y.o. female history of psoriatic arthritis, psoriasis, osteoarthritis and degenerative disc disease.  She denies any flare of psoriatic arthritis.  She has no active psoriasis lesions.  She states she continues to have pain and discomfort in her bilateral hands and her bilateral feet.  She has not noticed any joint swelling.  She also continues to have lower back pain for which she gets injections by Dr. Ernestina Patches.  She states she recently had injection for right trochanteric bursitis and right hip joint.  She states her pain was so severe that she was using a walker.  She has had some pain relief.  She is also going through difficult time due to her husband's health.  She has been taking Cymbalta and Zoloft was added due to increased anxiety and depression.  Her bone density is pending at this point.  Activities of Daily Living:  Patient reports morning stiffness for 1 hour.   Patient Reports nocturnal pain.  Difficulty dressing/grooming: Denies Difficulty climbing stairs: Denies Difficulty getting out of chair: Denies Difficulty using hands for taps, buttons, cutlery, and/or writing: Reports  Review of Systems  Constitutional: Positive for fatigue.  HENT: Positive for mouth dryness. Negative for mouth sores and nose dryness.   Eyes: Positive for dryness. Negative for pain and itching.  Respiratory: Negative for difficulty breathing.   Cardiovascular: Negative for chest pain and palpitations.  Gastrointestinal: Negative for blood in stool, constipation and diarrhea.  Endocrine: Negative for increased urination.  Genitourinary: Positive for involuntary urination. Negative  for difficulty urinating.  Musculoskeletal: Positive for arthralgias, joint pain, myalgias, morning stiffness, muscle tenderness and myalgias. Negative for joint swelling.  Skin: Negative for color change, rash and redness.  Allergic/Immunologic: Negative for susceptible to infections.  Neurological: Positive for dizziness, numbness and weakness. Negative for headaches and memory loss.  Hematological: Negative for bruising/bleeding tendency.  Psychiatric/Behavioral: Positive for depressed mood and sleep disturbance. Negative for confusion. The patient is nervous/anxious.     PMFS History:  Patient Active Problem List   Diagnosis Date Noted  . Hip pain 11/27/2019  . Type 2 diabetes mellitus with retinopathy, with long-term current use of insulin ([place]) 05/17/2018  . Overweight (BMI 25.0-29.9) 02/11/2018  . Vitamin D deficiency 10/13/2016  . 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 Iron deficiency anemia 04/28/2016  . Other insomnia 03/14/2016  . DDD (degenerative disc disease), cervical 03/14/2016  . Spondylosis without myelopathy or radiculopathy, lumbar region 01/31/2016  . Medicare annual wellness visit, initial 08/06/2015  . Anxiety and depression 06/25/2014  . Psoriatic arthritis - sees rheumatologist 02/20/2014  . Asthma 12/14/2008  . Diabetic peripheral neuropathy associated with type 2 diabetes mellitus ([place]) 08/06/2008  . Hyperlipemia 11/12/2006  . Essential hypertension 11/12/2006    Past Medical History:  Diagnosis Date  . Anemia 12/14/2008  . Anxiety and depression 12/14/2008  . Asthma 11/12/2006  . CAD (coronary artery disease) 12/14/2008   hx transluminal coronary angioplasty  . Cataract   . Depression   .  Diabetes mellitus with neuropathy ([place]) 12/14/2008  . Fibromyalgia 01/04/2007   sees rheuamtology  . 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 Hyperlipemia 01/21/2010  . Hypertension 12/14/2008  . Leg edema 12/14/2008  .  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 OSA (obstructive sleep apnea) 08/06/2008  . Osteoarthritis 12/14/2008  . Palpitations 11/12/2006   hx sinus tachy  . Psoriatic arthritis [facility])    sees rheumatology  . Rotator cuff injury     Family History  Problem Relation Age of Onset  . Arthritis Mother   . Alzheimer's disease Mother   . Stroke Father   . Alcohol abuse Father   . Diabetes Father   . Heart disease Father   . Early death Father 25  . Diabetes Sister   . Arthritis Sister   . COPD Sister   . Arthritis Maternal Aunt   . Diabetes Maternal Aunt   . Early death Maternal Aunt 62  . Diabetes Maternal Uncle   . Cancer Paternal Aunt   . COPD Paternal 47   . Heart disease Paternal Aunt   . Heart disease Paternal Uncle   . Alcohol abuse Paternal Grandfather   . Heart disease Paternal Grandfather   . Early death Paternal Grandfather 55  . Stroke Paternal Grandfather   . Hypertension Son   . Autoimmune disease Daughter   . Breast cancer Neg Hx    Past Surgical History:  Procedure Laterality Date  . ABDOMINAL HYSTERECTOMY    . CHOLECYSTECTOMY    . LAPAROSCOPIC GASTRIC BANDING    . PTCA    . WRIST SURGERY     LEFT   Social History   Social History Narrative   Married, [name]. 2 children name Abe People and Anderson Malta.   Some college, retired Engineer, manufacturing.   Drinks caffeine.   Take a daily vitamin.   Wears her seatbelt, exercises 3 times a week.   Smoke detector in the home.   Firearms in the home.   Feels safe in her relationships.   Immunization History  Administered Date(s) Administered  . Fluad Quad(high Dose 65+) 12/03/2018, 01/29/2020  . Influenza Split 01/21/2013  . Influenza, High Dose Seasonal PF 02/04/2015, 02/09/2016, 02/11/2018  . Influenza,inj,Quad PF,6+ Mos 01/15/2014  . Influenza-Unspecified 01/26/2017  . Moderna Sars-Covid-2 Vaccination 05/24/2019, 06/21/2019, 12/22/2019  . Pneumococcal Conjugate-13 02/04/2015  . Pneumococcal Polysaccharide-23  10/31/2013, 01/29/2020  . Tdap 02/07/2013     Objective: Vital Signs: BP 128/82 (BP Location: Right Arm, Patient Position: Sitting, Cuff Size: Normal)   Pulse 74   Resp 15   Ht 5\' 4"  (1.626 m)   Wt 152 lb 9.6 oz (69.2 kg)   LMP  (LMP Unknown)   BMI 26.19 kg/m    Physical Exam Vitals and nursing note reviewed.  Constitutional:      Appearance: She is well-developed and well-nourished.  HENT:     Head: Normocephalic and atraumatic.  Eyes:     Extraocular Movements: EOM normal.     Conjunctiva/sclera: Conjunctivae normal.  Cardiovascular:     Rate and Rhythm: Normal rate and regular rhythm.     Pulses: Intact distal pulses.     Heart sounds: Normal heart sounds.  Pulmonary:     Effort: Pulmonary effort is normal.     Breath sounds: Normal breath sounds.  Abdominal:     General: Bowel sounds are normal.     Palpations: Abdomen is soft.  Musculoskeletal:     Cervical back: Normal range of motion.  Lymphadenopathy:  Cervical: No cervical adenopathy.  Skin:    General: Skin is warm and dry.     Capillary Refill: Capillary refill takes less than 2 seconds.  Neurological:     Mental Status: She is alert and oriented to person, place, and time.  Psychiatric:        Mood and Affect: Mood and affect normal.        Behavior: Behavior normal.      Musculoskeletal Exam: C-spine was in good range of motion. She has limited painful range of motion for lumbar spine. Shoulder joints, elbow joints, wrist joints with good range of motion. She has PIP DIP and CMC thickening with no synovitis. She had some discomfort range of motion of her right hip joint. She tenderness on palpation of her right trochanteric bursa. Knee joints with good range of motion. She had tenderness over MTP joints but no synovitis was noted. She has bilateral pes planus.  CDAI Exam: CDAI Score: -- Patient Global: --; Provider Global: -- Swollen: --; Tender: -- Joint Exam 03/25/2020   No joint exam has been  documented for this visit   There is currently no information documented on the homunculus. Go to the Rheumatology activity and complete the homunculus joint exam.  Investigation: No additional findings.  Imaging: No results found.  Recent Labs: Lab Results  Component Value Date   WBC 7.6 11/19/2019   HGB 13.0 11/19/2019   PLT 336 11/19/2019   NA 140 11/19/2019   K 4.9 11/19/2019   CL 103 11/19/2019   CO2 30 11/19/2019   GLUCOSE 160 (H) 11/19/2019   BUN 20 11/19/2019   CREATININE 0.84 11/19/2019   BILITOT 0.3 11/19/2019   ALKPHOS 47 11/19/2019   AST 13 (L) 11/19/2019   ALT 12 11/19/2019   PROT 6.5 11/19/2019   ALBUMIN 4.3 11/19/2019   CALCIUM 9.6 11/19/2019   GFRAA >60 11/19/2019    Speciality Comments: No specialty comments available.  Procedures:  No procedures performed Allergies: Patient has no known allergies.   Assessment / Plan:     Visit Diagnoses: Psoriatic arthritis ([place]) - In remission: Patient had no synovitis on my examination today.  Psoriasis-she has no active psoriasis lesions.  Primary osteoarthritis of both hands-joint protection muscle strengthening was discussed.  Trochanteric bursitis, right hip-she had cortisone injection by Dr. Ernestina Patches in the past per patient. She had tenderness over right trochanteric bursa. IT band stretches were demonstrated in the office and a handout was given.  Pain in both feet-she complains of discomfort in her feet. She has lost fat pad and also has pes planus. Proper fitting shoes with insoles were discussed. Stretching exercises for the feet were discussed.  DDD (degenerative disc disease), cervical  Spondylosis without myelopathy or radiculopathy, lumbar region - She is followed by Dr. Ernestina Patches and has had facet joint injections in the past.  Fibromyalgia -she is on cymbalta 60mg  by mouth daily. She states the pain is manageable.  Other insomnia - Ambien 5 mg 1 tablet by mouth at bedtime. She is getting about 4  hours of sleep at night. She is satisfied with the time of sleep she is getting.  Other fatigue-related to insomnia and fibromyalgia.  Gastric bypass status for obesity  History of diabetes mellitus  History of depression  History of scoliosis  History of anemia  History of sleep apnea  History of vitamin D deficiency  History of anxiety  History of asthma  Osteoporosis screening-I do not see a DEXA scan in  the system. I would recommend her to get a DEXA scan. I noticed that there is an order placed in the chart already.  Orders: No orders of the defined types were placed in this encounter.  No orders of the defined types were placed in this encounter.  .  Follow-Up Instructions: Return in about 6 months (around 09/23/2020) for Psoriatic arthritis, Osteoarthritis.   Bo Merino, MD  Note - This record has been created using Editor, commissioning.  Chart creation errors have been sought, but may not always  have been located. Such creation errors do not reflect on  the standard of medical care.

## 2020-03-18 ENCOUNTER — Other Ambulatory Visit: Payer: Self-pay | Admitting: Family Medicine

## 2020-03-25 ENCOUNTER — Encounter: Payer: Self-pay | Admitting: Rheumatology

## 2020-03-25 ENCOUNTER — Other Ambulatory Visit: Payer: Self-pay

## 2020-03-25 ENCOUNTER — Ambulatory Visit (INDEPENDENT_AMBULATORY_CARE_PROVIDER_SITE_OTHER): Payer: Medicare Other | Admitting: Rheumatology

## 2020-03-25 VITALS — BP 128/82 | HR 74 | Resp 15 | Ht 64.0 in | Wt 152.6 lb

## 2020-03-25 DIAGNOSIS — Z8669 Personal history of other diseases of the nervous system and sense organs: Secondary | ICD-10-CM

## 2020-03-25 DIAGNOSIS — L4050 Arthropathic psoriasis, unspecified: Secondary | ICD-10-CM

## 2020-03-25 DIAGNOSIS — M79671 Pain in right foot: Secondary | ICD-10-CM | POA: Diagnosis not present

## 2020-03-25 DIAGNOSIS — M47816 Spondylosis without myelopathy or radiculopathy, lumbar region: Secondary | ICD-10-CM

## 2020-03-25 DIAGNOSIS — Z13820 Encounter for screening for osteoporosis: Secondary | ICD-10-CM

## 2020-03-25 DIAGNOSIS — M797 Fibromyalgia: Secondary | ICD-10-CM

## 2020-03-25 DIAGNOSIS — Z8709 Personal history of other diseases of the respiratory system: Secondary | ICD-10-CM

## 2020-03-25 DIAGNOSIS — M19042 Primary osteoarthritis, left hand: Secondary | ICD-10-CM

## 2020-03-25 DIAGNOSIS — M5030 Other cervical disc degeneration, unspecified cervical region: Secondary | ICD-10-CM

## 2020-03-25 DIAGNOSIS — Z8739 Personal history of other diseases of the musculoskeletal system and connective tissue: Secondary | ICD-10-CM

## 2020-03-25 DIAGNOSIS — M7061 Trochanteric bursitis, right hip: Secondary | ICD-10-CM | POA: Diagnosis not present

## 2020-03-25 DIAGNOSIS — Z9884 Bariatric surgery status: Secondary | ICD-10-CM

## 2020-03-25 DIAGNOSIS — Z8639 Personal history of other endocrine, nutritional and metabolic disease: Secondary | ICD-10-CM

## 2020-03-25 DIAGNOSIS — R5383 Other fatigue: Secondary | ICD-10-CM | POA: Diagnosis not present

## 2020-03-25 DIAGNOSIS — L409 Psoriasis, unspecified: Secondary | ICD-10-CM

## 2020-03-25 DIAGNOSIS — Z8659 Personal history of other mental and behavioral disorders: Secondary | ICD-10-CM

## 2020-03-25 DIAGNOSIS — G4709 Other insomnia: Secondary | ICD-10-CM | POA: Diagnosis not present

## 2020-03-25 DIAGNOSIS — M19041 Primary osteoarthritis, right hand: Secondary | ICD-10-CM

## 2020-03-25 DIAGNOSIS — M79672 Pain in left foot: Secondary | ICD-10-CM

## 2020-03-25 DIAGNOSIS — Z862 Personal history of diseases of the blood and blood-forming organs and certain disorders involving the immune mechanism: Secondary | ICD-10-CM

## 2020-03-25 NOTE — Patient Instructions (Signed)
Iliotibial Band Syndrome Rehab Ask your health care provider which exercises are safe for you. Do exercises exactly as told by your health care provider and adjust them as directed. It is normal to feel mild stretching, pulling, tightness, or discomfort as you do these exercises. Stop right away if you feel sudden pain or your pain gets significantly worse. Do not begin these exercises until told by your health care provider. Stretching and range-of-motion exercises These exercises warm up your muscles and joints and improve the movement and flexibility of your hip and pelvis. Quadriceps stretch, prone  1. Lie on your abdomen on a firm surface, such as a bed or padded floor (prone position). 2. Bend your left / right knee and reach back to hold your ankle or pant leg. If you cannot reach your ankle or pant leg, loop a belt around your foot and grab the belt instead. 3. Gently pull your heel toward your buttocks. Your knee should not slide out to the side. You should feel a stretch in the front of your thigh and knee (quadriceps). 4. Hold this position for __________ seconds. Repeat __________ times. Complete this exercise __________ times a day. Iliotibial band stretch An iliotibial band is a strong band of muscle tissue that runs from the outer side of your hip to the outer side of your thigh and knee. 1. Lie on your side with your left / right leg in the top position. 2. Bend both of your knees and grab your left / right ankle. Stretch out your bottom arm to help you balance. 3. Slowly bring your top knee back so your thigh goes behind your trunk. 4. Slowly lower your top leg toward the floor until you feel a gentle stretch on the outside of your left / right hip and thigh. If you do not feel a stretch and your knee will not fall farther, place the heel of your other foot on top of your knee and pull your knee down toward the floor with your foot. 5. Hold this position for __________  seconds. Repeat __________ times. Complete this exercise __________ times a day. Strengthening exercises These exercises build strength and endurance in your hip and pelvis. Endurance is the ability to use your muscles for a long time, even after they get tired. Straight leg raises, side-lying This exercise strengthens the muscles that rotate the leg at the hip and move it away from your body (hip abductors). 1. Lie on your side with your left / right leg in the top position. Lie so your head, shoulder, hip, and knee line up. You may bend your bottom knee to help you balance. 2. Roll your hips slightly forward so your hips are stacked directly over each other and your left / right knee is facing forward. 3. Tense the muscles in your outer thigh and lift your top leg 4-6 inches (10-15 cm). 4. Hold this position for __________ seconds. 5. Slowly return to the starting position. Let your muscles relax completely before doing another repetition. Repeat __________ times. Complete this exercise __________ times a day. Leg raises, prone This exercise strengthens the muscles that move the hips (hip extensors). 1. Lie on your abdomen on your bed or a firm surface. You can put a pillow under your hips if that is more comfortable for your lower back. 2. Bend your left / right knee so your foot is straight up in the air. 3. Squeeze your buttocks muscles and lift your left / right thigh   off the bed. Do not let your back arch. 4. Tense your thigh muscle as hard as you can without increasing any knee pain. 5. Hold this position for __________ seconds. 6. Slowly lower your leg to the starting position and allow it to relax completely. Repeat __________ times. Complete this exercise __________ times a day. Hip hike 1. Stand sideways on a bottom step. Stand on your left / right leg with your other foot unsupported next to the step. You can hold on to the railing or wall for balance if needed. 2. Keep your knees  straight and your torso square. Then lift your left / right hip up toward the ceiling. 3. Slowly let your left / right hip lower toward the floor, past the starting position. Your foot should get closer to the floor. Do not lean or bend your knees. Repeat __________ times. Complete this exercise __________ times a day. This information is not intended to replace advice given to you by your health care provider. Make sure you discuss any questions you have with your health care provider. Document Revised: 07/18/2018 Document Reviewed: 01/16/2018 [employer]vier Patient Education  2020 [employer]  

## 2020-03-29 ENCOUNTER — Encounter: Payer: Self-pay | Admitting: Physical Medicine and Rehabilitation

## 2020-03-29 MED ORDER — BUPIVACAINE HCL 0.25 % IJ SOLN
4.0000 mL | INTRAMUSCULAR | Status: AC | PRN
  Administered 2020-02-05: 10:00:00 4 mL via INTRA_ARTICULAR

## 2020-03-29 MED ORDER — TRIAMCINOLONE ACETONIDE 40 MG/ML IJ SUSP
60.0000 mg | INTRAMUSCULAR | Status: AC | PRN
  Administered 2020-02-05: 10:00:00 60 mg via INTRA_ARTICULAR

## 2020-03-29 NOTE — Progress Notes (Signed)
Priscilla [name] - 69 y.o. female MRN [id number]  Date of birth: 1950/06/27  Office Visit Note: Visit Date: 02/05/2020 PCP: Ma Hillock, DO Referred by: Ma Hillock, DO  Subjective: No chief complaint on file.  HPI:  Priscilla [name] is a 69 y.o. female who comes in today For evaluation management of chronic worsening severe back and hip pain on the right.  Long history of both low back pain and hip and lateral right thigh pain.  She is followed by Dr. Quentin Mulling in rheumatology for psoriatic arthritis.  She is also followed by Dr. Jodi Marble.  Her case is complicated by type 2 diabetes.  We have completed multiple facet joint blocks over the years with good relief of her back pain.  More recently she has had issues in the right hip particular going sit to stand getting in and out of the chair and out of the car.  We had completed bursa injection in the past with decent relief at one point.  Last facet joint injection did give her relief.  MRI of the lumbar spine from 2016 does show severe facet arthropathy at L4-5 with listhesis and severe stenosis.  She is never really had radicular leg pain down the legs.  No paresthesia.  Case again complicated by peripheral polyneuropathy.  Nonetheless the last time we saw her we decided to complete MRI of the hip given the fact that x-ray imaging showed very mild changes of the hip.  She comes in today for review of MRI of the hip.  This does show some mild arthritic changes as well as bursitis and tendinitis nothing severe.  Her pain today is consistent more though with the hip mechanical issue than her back.  Again no radicular pain no focal weakness no new trauma.  Review of Systems  Musculoskeletal: Positive for back pain and joint pain.  All other systems reviewed and are negative.  Otherwise per HPI.  Assessment & Plan: Visit Diagnoses:    ICD-10-CM   1. Pain in right hip  M25.551 XR C-ARM NO REPORT  2. Spinal stenosis of  lumbar region without neurogenic claudication  M48.061   3. Spondylosis without myelopathy or radiculopathy, lumbar region  M47.816   4. Spondylolisthesis of lumbar region  M43.16   5. Psoriatic arthritis - sees rheumatologist  L40.50     Plan: Findings:  Chronic worsening severe low back pain with history of lumbar spondylosis severe arthropathy at L4-5 with listhesis and severe stenosis.  In the past facet joint blocks were very beneficial for her and she never really even needed to pursue ablation.  She has never really had neurogenic claudication symptoms even though she has severe stenosis.  Over the last several months has had hip pain that was hard to decide if this was more of her spine versus her hip.  We did complete intra-articular hip injection today diagnostically and she did get relief during this injection during the anesthetic phase.  MRI of the hip had been performed.  We will see how she does.  Consider L4 transforaminal epidural steroid injection.    Meds & Orders: No orders of the defined types were placed in this encounter.   Orders Placed This Encounter  Procedures  . Large Joint Inj  . XR C-ARM NO REPORT    Follow-up: No follow-ups on file.   Procedures: Large Joint Inj: R hip joint on 02/05/2020 10:17 AM Indications: diagnostic evaluation and pain Details: 22 G  3.5 in needle, fluoroscopy-guided anterior approach  Arthrogram: No  Medications: 4 mL bupivacaine 0.25 %; 60 mg triamcinolone acetonide 40 MG/ML Outcome: tolerated well, no immediate complications  There was excellent flow of contrast producing a partial arthrogram of the hip. The patient did have relief of symptoms during the anesthetic phase of the injection. Procedure, treatment alternatives, risks and benefits explained, specific risks discussed. Consent was given by the patient. Immediately prior to procedure a time out was called to verify the correct patient, procedure, equipment, support staff and  site/side marked as required. Patient was prepped and draped in the usual sterile fashion.          Clinical History: MR OF THE RIGHT HIP WITHOUT CONTRAST  TECHNIQUE: Multiplanar, multisequence MR imaging was performed. No intravenous contrast was administered.  COMPARISON:  Plain films right hip 01/23/2016.  FINDINGS: Bones: No fracture, stress change or worrisome lesion is identified. There is no subchondral edema or cyst formation about either hip. No avascular necrosis of the femoral heads. Marrow signal is diffusely heterogeneous with an appearance most suggestive reactivation as can be seen in obesity.  Articular cartilage and labrum  Articular cartilage:  Thinned and degenerated without focal defect.  Labrum:  Intact.  Joint or bursal effusion  Joint effusion:  Negative.  Bursae: There is a small volume of fluid in the right trochanteric bursa.  Muscles and tendons  Muscles and tendons: Intact. Mild gluteus medius and right hamstring tendinosis noted.  Other findings  Miscellaneous: Imaged intrapelvic contents demonstrate postoperative change of hysterectomy. No acute or focal abnormality is identified.  IMPRESSION: Mild appearing right hip osteoarthritis.  Small volume of fluid in the right trochanteric bursa compatible with bursitis.  Mild appearing right gluteus medius and hamstring tendinosis without tear.   Electronically Signed   By: Inge Rise M.D.   On: 01/14/2020 10:43 ------     L3-L4: Eccentric left mild broad-based disc bulge. Moderate  bilateral facet arthropathy. Mild spinal stenosis. No evidence of  neural foraminal stenosis.    L4-L5: Moderate broad-based disc bulge with a posterior annular  fissure. Severe bilateral facet arthropathy with ligamentum flavum  infolding resulting in severe spinal stenosis and bilateral lateral  recess stenosis. No evidence of neural foraminal stenosis.    L5-S1:  Mild broad-based disc bulge. Moderate bilateral facet  arthropathy. No evidence of neural foraminal stenosis. No central  canal stenosis.    IMPRESSION:  1. Lumbar spine spondylosis most severe at L4-5.  2. At L4-5 there is a moderate broad-based disc bulge with a  posterior annular fissure. Severe bilateral facet arthropathy with  ligamentum flavum infolding resulting in severe spinal stenosis and  bilateral lateral recess stenosis.      Electronically Signed  By: Kathreen Devoid  On: 10/31/2014 16:53     Objective:  VS:  HT:    WT:   BMI:     BP:   HR: bpm  TEMP: ( )  RESP:  Physical Exam Constitutional:      General: She is not in acute distress.    Appearance: Normal appearance. She is not ill-appearing.  HENT:     Head: Normocephalic and atraumatic.     Right Ear: External ear normal.     Left Ear: External ear normal.  Eyes:     Extraocular Movements: Extraocular movements intact.  Cardiovascular:     Rate and Rhythm: Normal rate.     Pulses: Normal pulses.  Musculoskeletal:     Right lower leg: No  edema.     Left lower leg: No edema.     Comments: Patient has good distal strength with no pain over the greater trochanters.  No clonus or focal weakness.  Pain in the right hip at end ranges of external rotation more than internal rotation.  Negative Fortin finger test.  Skin:    Findings: No erythema, lesion or rash.  Neurological:     General: No focal deficit present.     Mental Status: She is alert and oriented to person, place, and time.     Sensory: No sensory deficit.     Motor: No weakness or abnormal muscle tone.     Coordination: Coordination normal.  Psychiatric:        Mood and Affect: Mood normal.        Behavior: Behavior normal.      Imaging: No results found.

## 2020-03-30 ENCOUNTER — Other Ambulatory Visit: Payer: Self-pay | Admitting: Family Medicine

## 2020-04-01 ENCOUNTER — Other Ambulatory Visit: Payer: Self-pay | Admitting: Rheumatology

## 2020-04-01 NOTE — Telephone Encounter (Signed)
Last Visit: 03/25/2020 Next Visit: 09/23/2020  Last Fill: 02/19/2020  Okay to refill Ambien?

## 2020-04-07 ENCOUNTER — Encounter: Payer: Self-pay | Admitting: Internal Medicine

## 2020-04-08 ENCOUNTER — Other Ambulatory Visit: Payer: Self-pay | Admitting: Family Medicine

## 2020-04-12 ENCOUNTER — Other Ambulatory Visit: Payer: Self-pay | Admitting: Family Medicine

## 2020-04-13 ENCOUNTER — Other Ambulatory Visit: Payer: Self-pay | Admitting: Family Medicine

## 2020-04-13 DIAGNOSIS — Z1231 Encounter for screening mammogram for malignant neoplasm of breast: Secondary | ICD-10-CM

## 2020-04-13 NOTE — Telephone Encounter (Signed)
Pt scheduled in Feb. Pt has enough medication to make it to appt.

## 2020-04-13 NOTE — Telephone Encounter (Signed)
Please call pt: I have received a refill request on her metoprolol. We have refilled for 30 d to allow her time to schedule her overdue 6 mos appt. Please schedule her ASAP for [facility] to avoid her running out of medications.   Thanks

## 2020-04-30 ENCOUNTER — Other Ambulatory Visit: Payer: Self-pay | Admitting: Family Medicine

## 2020-04-30 DIAGNOSIS — I10 Essential (primary) hypertension: Secondary | ICD-10-CM

## 2020-05-04 ENCOUNTER — Ambulatory Visit: Payer: Medicare Other | Admitting: Internal Medicine

## 2020-05-11 ENCOUNTER — Other Ambulatory Visit: Payer: Self-pay

## 2020-05-11 ENCOUNTER — Other Ambulatory Visit: Payer: Self-pay | Admitting: Family Medicine

## 2020-05-11 ENCOUNTER — Encounter: Payer: Self-pay | Admitting: Family Medicine

## 2020-05-11 ENCOUNTER — Ambulatory Visit (INDEPENDENT_AMBULATORY_CARE_PROVIDER_SITE_OTHER): Payer: Medicare Other | Admitting: Family Medicine

## 2020-05-11 VITALS — BP 113/74 | HR 57 | Temp 98.2°F | Ht 64.0 in | Wt 156.0 lb

## 2020-05-11 DIAGNOSIS — Z794 Long term (current) use of insulin: Secondary | ICD-10-CM

## 2020-05-11 DIAGNOSIS — E663 Overweight: Secondary | ICD-10-CM | POA: Diagnosis not present

## 2020-05-11 DIAGNOSIS — F32A Depression, unspecified: Secondary | ICD-10-CM | POA: Diagnosis not present

## 2020-05-11 DIAGNOSIS — E559 Vitamin D deficiency, unspecified: Secondary | ICD-10-CM

## 2020-05-11 DIAGNOSIS — E1142 Type 2 diabetes mellitus with diabetic polyneuropathy: Secondary | ICD-10-CM | POA: Diagnosis not present

## 2020-05-11 DIAGNOSIS — I10 Essential (primary) hypertension: Secondary | ICD-10-CM | POA: Diagnosis not present

## 2020-05-11 DIAGNOSIS — F419 Anxiety disorder, unspecified: Secondary | ICD-10-CM

## 2020-05-11 DIAGNOSIS — R209 Unspecified disturbances of skin sensation: Secondary | ICD-10-CM | POA: Diagnosis not present

## 2020-05-11 DIAGNOSIS — M47816 Spondylosis without myelopathy or radiculopathy, lumbar region: Secondary | ICD-10-CM

## 2020-05-11 DIAGNOSIS — E785 Hyperlipidemia, unspecified: Secondary | ICD-10-CM | POA: Diagnosis not present

## 2020-05-11 DIAGNOSIS — M79606 Pain in leg, unspecified: Secondary | ICD-10-CM

## 2020-05-11 DIAGNOSIS — E11319 Type 2 diabetes mellitus with unspecified diabetic retinopathy without macular edema: Secondary | ICD-10-CM | POA: Diagnosis not present

## 2020-05-11 LAB — COMPREHENSIVE METABOLIC PANEL
ALT: 14 U/L (ref 0–35)
AST: 12 U/L (ref 0–37)
Albumin: 4.2 g/dL (ref 3.5–5.2)
Alkaline Phosphatase: 53 U/L (ref 39–117)
BUN: 23 mg/dL (ref 6–23)
CO2: 33 mEq/L — ABNORMAL HIGH (ref 19–32)
Calcium: 9.8 mg/dL (ref 8.4–10.5)
Chloride: 99 mEq/L (ref 96–112)
Creatinine, Ser: 0.71 mg/dL (ref 0.40–1.20)
GFR: 85.96 mL/min (ref >60.00)
Glucose, Bld: 102 mg/dL — ABNORMAL HIGH (ref 70–99)
Potassium: 4.8 mEq/L (ref 3.5–5.1)
Sodium: 137 mEq/L (ref 135–145)
Total Bilirubin: 0.3 mg/dL (ref 0.2–1.2)
Total Protein: 6.7 g/dL (ref 6.0–8.3)

## 2020-05-11 LAB — LIPID PANEL
Cholesterol: 202 mg/dL — ABNORMAL HIGH (ref 0–200)
HDL: 71.3 mg/dL (ref >39.00)
LDL Cholesterol: 115 mg/dL — ABNORMAL HIGH (ref 0–99)
NonHDL: 130.97
Total CHOL/HDL Ratio: 3
Triglycerides: 79 mg/dL (ref 0.0–149.0)
VLDL: 15.8 mg/dL (ref 0.0–40.0)

## 2020-05-11 LAB — VITAMIN D 25 HYDROXY (VIT D DEFICIENCY, FRACTURES): VITD: 69.99 ng/mL (ref 30.00–100.00)

## 2020-05-11 LAB — TSH: TSH: 2.93 u[IU]/mL (ref 0.35–4.50)

## 2020-05-11 MED ORDER — PRAVASTATIN SODIUM 20 MG PO TABS: 20.0000 mg | ORAL_TABLET | Freq: Every day | ORAL | 11 refills | Status: DC

## 2020-05-11 MED ORDER — METOPROLOL TARTRATE 25 MG PO TABS: ORAL_TABLET | ORAL | 5 refills | Status: DC

## 2020-05-11 MED ORDER — DULOXETINE HCL 60 MG PO CPEP: 60.0000 mg | ORAL_CAPSULE | Freq: Every day | ORAL | 5 refills | Status: DC

## 2020-05-11 MED ORDER — LISINOPRIL 2.5 MG PO TABS
ORAL_TABLET | ORAL | 1 refills | Status: DC
Start: 2020-05-11 — End: 2020-10-15

## 2020-05-11 MED ORDER — SERTRALINE HCL 100 MG PO TABS: 100.0000 mg | ORAL_TABLET | Freq: Every day | ORAL | 5 refills | Status: DC

## 2020-05-11 MED ORDER — PRAVASTATIN SODIUM 40 MG PO TABS: 40.0000 mg | ORAL_TABLET | Freq: Every day | ORAL | 11 refills | Status: DC

## 2020-05-11 NOTE — Progress Notes (Signed)
Patient Care Team    Relationship Specialty Notifications Start End  Ma Hillock, DO PCP - General Family Medicine  07/19/15    Comment: transfer to [place], Tonna Corner, MD Consulting Physician Orthopedic Surgery  08/06/15   Philemon Kingdom, MD Consulting Physician Internal Medicine  08/06/15    Comment: endocrine  Mcarthur Rossetti, MD Consulting Physician Orthopedic Surgery  08/06/15   Bo Merino, MD Consulting Physician Rheumatology  08/06/15   Volanda Napoleon, MD Consulting Physician Oncology  11/10/16   Magnus Sinning, MD Consulting Physician Physical Medicine and Rehabilitation  11/10/16   Dermatology, [facility]    11/10/16    Comment: Dr. [place]  Hyatt, Romilda Garret, DPM Consulting Physician Podiatry  12/03/18      SUBJECTIVE Chief Complaint  Patient presents with  . Follow-up    [facility]; pt is not fasting     HPI: Priscilla [name] is a 70 y.o. female present for cmc Hypertension/hyperlipidemia: Pt reports  compliance with metoprolol 25 mg BID and lisinopril 2.5 mg daily. Blood pressures ranges at home are not checked.Patient denies chest pain, shortness of breath, dizziness or lower extremity edema.  Patient does endorse having more frequent cold cough, bluish feet.  She states if she is up and walking her feet will be warmer or if she is in bed under covers her feet will become warm and pink, otherwise her feet are always cold and have a bluish hue to them. Pt takes a daily baby ASA. Pt is taking pravastatin. Labs UTD Diet: low sodium.  Exercise: routinely.  RF: HTN, HLD, DM, FHx HD  Peripheral neuropathy/anxiety: Patient reports compliance with  Cymbalta 60 mg a day and zoloft 50 mg which was added last visit. She reports the Cymbalta has been  helpful with her neuropathy.  She reports the added Zoloft was very helpful but thinks she could use a increase in dose today.   ROS: See pertinent positives and negatives per HPI.  Depression screen [facility] 2/9 01/14/2020  09/23/2019 03/14/2019 12/03/2018 08/13/2018  Decreased Interest 0 0 0 0 0  Down, Depressed, Hopeless 3 3 0 0 0  PHQ - 2 Score 3 3 0 0 0  Altered sleeping 0 3 0 - 0  Tired, decreased energy _0 - 1  Change in appetite 0 0 0 - 0  Feeling bad or failure about yourself  0 0 0 - 0  Trouble concentrating 1 0 0 - 0  Moving slowly or fidgety/restless 0 0 0 - 0  Suicidal thoughts 0 0 0 - 0  PHQ-9 Score _1 - 1  Difficult doing work/chores Somewhat difficult Somewhat difficult Not difficult at all - Not difficult at all  Some recent data might be hidden   GAD 7 : Generalized Anxiety Score 09/23/2019 03/14/2019 08/13/2018 02/11/2018  Nervous, Anxious, on Edge 3 0 2 1  Control/stop worrying 3 0 0 1  Worry too much - different things 3 0 3 1  Trouble relaxing 0 0 0 0  Restless 0 0 0 0  Easily annoyed or irritable 0 0 0 1  Afraid - awful might happen 0 0 0 1  Total GAD 7 Score 9 0 5 5  Anxiety Difficulty Not difficult at all Not difficult at all Not difficult at all Somewhat difficult    Patient Active Problem List   Diagnosis Date Noted  . Hip pain 11/27/2019  . Type 2 diabetes mellitus with retinopathy, with long-term current use  of insulin ([place]) 05/17/2018  . Overweight (BMI 25.0-29.9) 02/11/2018  . Vitamin D deficiency 10/13/2016  . 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 Iron deficiency anemia 04/28/2016  . Other insomnia 03/14/2016  . DDD (degenerative disc disease), cervical 03/14/2016  . Spondylosis without myelopathy or radiculopathy, lumbar region 01/31/2016  . Anxiety and depression 06/25/2014  . Psoriatic arthritis - sees rheumatologist 02/20/2014  . Asthma 12/14/2008  . Diabetic peripheral neuropathy associated with type 2 diabetes mellitus ([place]) 08/06/2008  . Hyperlipemia 11/12/2006  . Essential hypertension 11/12/2006    Social History   Tobacco Use  . Smoking status: Never Smoker  . Smokeless tobacco: Never Used  Substance Use  Topics  . Alcohol use: Yes    Comment: glass of wine-special occasion    Current Outpatient Medications:  .  albuterol (PROVENTIL HFA;VENTOLIN HFA) 108 (90 Base) MCG/ACT inhaler, Inhale 2 puffs into the lungs every 6 (six) hours as needed for wheezing or shortness of breath., Disp: 1 Inhaler, Rfl: 1 .  aspirin 81 MG tablet, Take 81 mg by mouth daily., Disp: , Rfl:  .  Biotin 5000 MCG TABS, Take 5,000 mcg by mouth daily. , Disp: , Rfl:  .  calcium-vitamin D 250-100 MG-UNIT per tablet, Take 1 tablet by mouth daily. , Disp: , Rfl:  .  Continuous Blood Gluc Sensor (FREESTYLE LIBRE 2 SENSOR) MISC, Use sensors with CGM to check sugars., Disp: 6 each, Rfl: 3 .  Cyanocobalamin (VITAMIN B12 PO), Place under the tongue., Disp: , Rfl:  .  glucose blood (ONETOUCH VERIO) test strip, USE TO TEST BLOOD SUGAR TWICE A DAY DX: E11.319, Disp: 200 each, Rfl: 0 .  insulin aspart (NOVOLOG FLEXPEN) 100 UNIT/ML FlexPen, Inject 3-8 Units into the skin 3 (three) times daily with meals., Disp: 30 mL, Rfl: 3 .  insulin detemir (LEVEMIR) 100 UNIT/ML injection, Inject 0.15 mLs (15 Units total) into the skin at bedtime., Disp: 30 mL, Rfl: 3 .  Insulin Syringe-Needle U-100 (B-D INS SYR MICROFINE 1CC/27G) 27G X 5/8" 1 ML MISC, Use to inject levemir once daily DX: E11.319, Disp: 120 each, Rfl: 6 .  metFORMIN (GLUCOPHAGE) 1000 MG tablet, TAKE 1 TABLET BY MOUTH TWICE DAILY WITH MEALS, Disp: 180 tablet, Rfl: 3 .  Multiple Vitamin (MULTIVITAMIN) tablet, Take 1 tablet by mouth daily. , Disp: , Rfl:  .  terbinafine (LAMISIL) 250 MG tablet, Take 1 tablet (250 mg total) by mouth daily., Disp: 90 tablet, Rfl: 0 .  Vitamin D, Ergocalciferol, (DRISDOL) 1.25 MG (50000 UNIT) CAPS capsule, TAKE 1 CAPSULE BY MOUTH EVERY 7 DAYS, Disp: 4 capsule, Rfl: 6 .  zolpidem (AMBIEN) 5 MG tablet, TAKE 1 TABLET(5 MG) BY MOUTH AT BEDTIME AS NEEDED FOR SLEEP, Disp: 30 tablet, Rfl: 2 .  DULoxetine (CYMBALTA) 60 MG capsule, Take 1 capsule (60 mg total) by  mouth daily., Disp: 30 capsule, Rfl: 5 .  lisinopril (ZESTRIL) 2.5 MG tablet, TAKE 1 TABLET(2.5 MG) BY MOUTH DAILY, Disp: 90 tablet, Rfl: 1 .  metoprolol tartrate (LOPRESSOR) 25 MG tablet, TAKE 1 TABLET(25 MG) BY MOUTH TWICE DAILY, Disp: 60 tablet, Rfl: 5 .  pravastatin (PRAVACHOL) 20 MG tablet, Take 1 tablet (20 mg total) by mouth daily., Disp: 30 tablet, Rfl: 11 .  sertraline (ZOLOFT) 100 MG tablet, Take 1 tablet (100 mg total) by mouth daily., Disp: 30 tablet, Rfl: 5  No Known Allergies  OBJECTIVE: BP 113/74   Pulse (!) 57   Temp 98.2 F (  36.8 C) (Oral)   Ht _0  (1.626 m)   Wt 156 lb (70.8 kg)   LMP  (LMP Unknown)   SpO2 100%   BMI 26.78 kg/m  Gen: Afebrile. No acute distress. Very pleasant female.  HENT: AT. [place] Eyes:Pupils Equal Round Reactive to light, Extraocular movements intact,  Conjunctiva without redness, discharge or icterus. Neck/lymp/endocrine: Supple,no lymphadenopathy, no thyromegaly CV: RRR no murmur, no edema Chest: CTAB, no wheeze or crackles NTI:[id number] cold extremities,  +2/4 PT right, +1 left PT Skin: no rashes, purpura or petechiae.  Neuro:  Normal gait. PERLA. EOMi. Alert. Oriented x3 Psych: Normal affect, dress and demeanor. Normal speech. Normal thought content and judgment.   ASSESSMENT AND PLAN: Priscilla [name] is a 70 y.o. female present for  Essential hypertension/palpitations/HLD/overweight -Stable. -Continue metoprolol 25 mg daily - continue  Lisinopril 2.5 mg daily - continue pravastatin - low sodium. Routine exercise.  - con't baby ASA - monitor for elevated pressures > 140/90 and return to clinic if routinely above goal.  - f/u 5.5 months.   Peripheral artery disease: Discussed peripheral artery disease with her today in detail.  She was also provided with written education on peripheral artery disease.  She is on a statin medication and taking a baby aspirin.  She has well-controlled diabetes.  Will obtain ABIs and consider  referral if appropriate.  Vitamin D deficiency: Vitamin D levels   Anxiety and depression/neuropathy Improved, but still could use somatic control Continue Cymbalta 60 mg daily> this provider took over the prescription for her today. Increase Zoloft 100 mg.   -Follow-up 5.5 months-sooner if depression anxiety is not adequately controlled.  type 2 diabetes mellitus with retinopathy, with long-term current use of insulin ([employer]) Managed by endocrinology, last A1c looked good last visit and they are working together for hypoglycemic events.  Reports she has an upcoming appointment.   Orders Placed This Encounter  Procedures  . TSH  . Comp Met (CMET)  . Lipid panel  . Vitamin D (25 hydroxy)  . VAS [place] ABI WITH/WO TBI  . VAS [place] LOWER EXTREMITY ARTERIAL DUPLEX   Meds ordered this encounter  Medications  . pravastatin (PRAVACHOL) 20 MG tablet    Sig: Take 1 tablet (20 mg total) by mouth daily.    Dispense:  30 tablet    Refill:  11  . metoprolol tartrate (LOPRESSOR) 25 MG tablet    Sig: TAKE 1 TABLET(25 MG) BY MOUTH TWICE DAILY    Dispense:  60 tablet    Refill:  5    1 mo supply only. Pt is due for appt prior to any additional refills.  Marland Kitchen lisinopril (ZESTRIL) 2.5 MG tablet    Sig: TAKE 1 TABLET(2.5 MG) BY MOUTH DAILY    Dispense:  90 tablet    Refill:  1    **Patient requests 90 days supply**  . DULoxetine (CYMBALTA) 60 MG capsule    Sig: Take 1 capsule (60 mg total) by mouth daily.    Dispense:  30 capsule    Refill:  5    DC prior scripts for cymbalta by all providers. Taking over management, pt desires 30 days at a time.  . sertraline (ZOLOFT) 100 MG tablet    Sig: Take 1 tablet (100 mg total) by mouth daily.    Dispense:  30 tablet    Refill:  5   Referral Orders  No referral(s) requested today      Howard Pouch, DO 05/11/2020

## 2020-05-11 NOTE — Patient Instructions (Addendum)
Great to see you today.  I have refilled your medications.  We will call you with lab results.   I will order the study for your leg circulation and they will call you to schedule.    Peripheral Vascular Disease  Peripheral vascular disease (PVD) is a disease of the blood vessels that carry blood from the heart to the rest of the body. PVD is also called peripheral artery disease (PAD) or poor circulation. PVD affects most of the body. But it affects the legs and feet the most. PVD can lead to acute limb ischemia. This happens when there is a sudden stop of blood flow to an arm or leg. This is a medical emergency. What are the causes? The most common cause of PVD is a buildup of a fatty substance (plaque) inside your arteries. This decreases blood flow. Plaque can break off and block blood in a smaller artery. This can lead to acute limb ischemia. Other common causes of PVD include:  Blood clots inside the blood vessels.  Injuries to blood vessels.  Irritation and swelling of blood vessels.  Sudden tightening of the blood vessel (spasms). What increases the risk?  A family history of PVD.  Medical conditions, including: ? High cholesterol. ? Diabetes. ? High blood pressure. ? Heart disease. ? Past problems with blood clots. ? Past injury, such as burns or a broken bone.  Other conditions, such as: ? Buerger's disease. This is caused by swollen or irritated blood vessels in your hands and feet. ? Arthritis. ? Birth defects that affect the arteries in your legs. ? Kidney disease.  Using tobacco or nicotine products.  Not getting enough exercise.  Being very overweight (obese).  Being 5 years old or older. What are the signs or symptoms?  Cramps in your butt, legs, and feet.  Pain and weakness in your legs when you are active that goes away when you rest.  Leg pain when at rest.  Leg numbness, tingling, or weakness.  Coldness in a leg or foot, especially when  compared with the other leg or foot.  Skin or hair changes. These can include: ? Hair loss. ? Shiny skin. ? Pale or bluish skin. ? Thick toenails.  Being unable to get or keep an erection.  Tiredness (fatigue).  Weak pulse or no pulse in the feet.  Wounds and sores on the toes, feet, or legs. These take longer to heal. How is this treated? Underlying causes are treated first. Other conditions, like diabetes, high cholesterol, and blood pressure, are also treated. Treatment may include:  Lifestyle changes, such as: ? Quitting smoking. ? Getting regular exercise. ? Having a diet low in fat and cholesterol. ? Not drinking alcohol.  Taking medicines, such as: ? Blood thinners. ? Medicines to improve blood flow. ? Medicines to improve your blood cholesterol.  Procedures to: ? Open the arteries and restore blood flow. ? Insert a small mesh tube (stent) to keep a blocked vessel open. ? Create a new path for blood to flow to the body (peripheral bypass). ? Remove dead tissue from a wound. ? Remove an affected leg or arm. Follow these instructions at home: Medicines  Take over-the-counter and prescription medicines only as told by your doctor.  If you are taking blood thinners: ? Talk with your doctor before you take any medicines that have aspirin, or NSAIDs, such as ibuprofen. ? Take medicines exactly as told. Take them at the same time each day. ? Avoid doing things  that could hurt or bruise you. Take action to prevent falls. ? Wear an alert bracelet or carry a card that shows you are taking blood thinners. Lifestyle  Get regular exercise. Ask your doctor about how to stay active.  Talk with your doctor about keeping a healthy weight. If needed, ask about losing weight.  Eat a diet that is low in fat and cholesterol. If you need help, talk with your doctor.  Do not drink alcohol.  Do not smoke or use any products that contain nicotine or tobacco. If you need help  quitting, ask your doctor.      General instructions  Take good care of your feet. To do this: ? Wear shoes that fit well and feel good. ? Check your feet often for any cuts or sores.  Get a flu shot (influenza vaccine) each year.  Keep all follow-up visits. Where to find more information  Society for Vascular Surgery: vascular.org  American Heart Association: heart.org  National Heart, Lung, and Blood Institute: https://www.[place].biz/ Contact a doctor if:  You have cramps in your legs when you walk.  You have leg pain when you rest.  Your leg or foot feels cold.  Your skin changes.  You cannot get or keep an erection.  You have cuts or sores on your legs or feet that do not heal. Get help right away if:  You have sudden changes in the color and feeling of your arms or legs, such as: ? Your arm or leg turns cold, numb, and blue. ? Your arm or leg becomes red, warm, swollen, painful, or numb.  You have any signs of a stroke. "BE FAST" is an easy way to remember the main warning signs: ? B - Balance. Dizziness, sudden trouble walking, or loss of balance. ? E - Eyes. Trouble seeing or a change in how you see. ? F - Face. Sudden weakness or loss of feeling of the face. The face or eyelid may droop on one side. ? A - Arms. Weakness or loss of feeling in an arm. This happens all of a sudden and most often on one side of the body. ? S - Speech. Sudden trouble speaking, slurred speech, or trouble understanding what people say. ? T - Time. Time to call emergency services. Write down what time symptoms started.  You have other signs of a stroke, such as: ? A sudden, very bad headache with no known cause. ? Feeling like you may vomit (nausea). ? Vomiting. ? A seizure.  You have chest pain or trouble breathing. These symptoms may be an emergency. Get help right away. Call your local emergency services (911 in the [place]  Do not wait to see if the symptoms will go away.  Do not  drive yourself to the hospital. Summary  Peripheral vascular disease (PVD) is a disease of the blood vessels.  PVD affects the legs and feet the most.  Symptoms may include leg pain or leg numbness, tingling, and weakness.  Treatment may include lifestyle changes, medicines, and procedures. This information is not intended to replace advice given to you by your health care provider. Make sure you discuss any questions you have with your health care provider. Document Revised: 09/29/2019 Document Reviewed: 09/29/2019 Elsevier Patient Education  [place] Index Test Why am I having this test? The ankle-brachial index (ABI) test is used to diagnose peripheral vascular disease (PVD). PVD is also known as peripheral arterial disease (PAD). PVD is the  blocking or hardening of the arteries anywhere within the circulatory system beyond the heart. It is a form of cardiovascular disease. PVD is caused by:  Cholesterol deposits in your blood vessels (atherosclerosis). This is the most common cause of this condition.  Inflammation in the blood vessels.  Blood clots in the vessels. Cholesterol deposits cause arteries to narrow. Decreased delivery of oxygen to your tissues causes muscle pain, cramping, and fatigue that occurs during exercise and is relieved by rest. This is called intermittent claudication. PVD means that there may also be buildup of cholesterol:  In your heart. This raises the risk of heart attacks.  In your brain. This raises the risk of strokes. What is being tested? The ankle-brachial index test measures the blood flow in your arms and legs. The blood flow will show if blood vessels in your legs have been narrowed by cholesterol deposits. How do I prepare for this test?  Wear loose, comfortable clothing with shoes that are easy to remove.  Do not use any products that contain nicotine or tobacco for at least 30 minutes before the test. These  products include cigarettes, chewing tobacco, and vaping devices, such as e-cigarettes.  Avoid caffeine, alcohol, and heavy activity an hour before the test. What happens during the test?  You will lie down in a resting position with your legs and arms at heart level.  Your health care provider will use a blood pressure machine and a small ultrasound device (Doppler) to measure the systolic pressures on your upper arms and ankles. Systolic pressure is the pressure inside your arteries when your heart pumps.  Systolic pressures will be taken several times, and at several points, on both the ankle and the arm. Your health care provider may also measure your toe.  Your health care provider will divide the highest systolic pressure of the ankle by the highest systolic pressure of the arm. The result is the ankle-brachial pressure ratio, or ABI.  Sometimes this test will be repeated after you have exercised on a treadmill for 5 minutes. You may have leg pain during the exercise portion of the test if you suffer from PVD. If the index number drops after exercise, this may show that PVD is present.   How are the results reported? Your test results will be reported as a value that shows the ratio of your ankle pressure to your arm pressure (ABI ratio). Your health care provider will compare your results to normal ranges that were established after testing a large group of people (reference ranges). These ranges may vary among labs and hospitals. For this test, an abnormal reading is typically considered, which is an ABI ratio of 0.9 or less. What do the results mean? An ABI ratio that is below the reference range is considered abnormal and may indicate PVD in the legs. Talk with your health care provider about what your results mean. Questions to ask your health care provider Ask your health care provider, or the department that is doing the test:  When will my results be ready?  How will I get my  results?  What are my treatment options?  What other tests do I need?  What are my next steps? Summary  The ankle-brachial index (ABI) test is used to diagnose peripheral vascular disease (PVD). PVD is also known as peripheral arterial disease (PAD).  The ABI test measures the blood flow in your arms and legs.  The highest systolic pressure of the ankle is divided by  the highest systolic pressure of the arm. The result is the ABI ratio.  An ABI ratio of 0.9 or less is considered abnormal and may indicate PVD in the legs. This information is not intended to replace advice given to you by your health care provider. Make sure you discuss any questions you have with your health care provider. Document Revised: 09/29/2019 Document Reviewed: 09/29/2019 Elsevier Patient Education  [place].

## 2020-05-13 ENCOUNTER — Other Ambulatory Visit: Payer: Self-pay | Admitting: Family Medicine

## 2020-05-13 DIAGNOSIS — R209 Unspecified disturbances of skin sensation: Secondary | ICD-10-CM

## 2020-05-13 DIAGNOSIS — M79606 Pain in leg, unspecified: Secondary | ICD-10-CM

## 2020-05-21 ENCOUNTER — Inpatient Hospital Stay: Payer: Medicare Other

## 2020-05-21 ENCOUNTER — Inpatient Hospital Stay: Payer: Medicare Other | Admitting: Family

## 2020-05-25 ENCOUNTER — Ambulatory Visit (INDEPENDENT_AMBULATORY_CARE_PROVIDER_SITE_OTHER): Payer: Medicare Other | Admitting: Podiatry

## 2020-05-25 ENCOUNTER — Other Ambulatory Visit: Payer: Self-pay

## 2020-05-25 ENCOUNTER — Encounter: Payer: Self-pay | Admitting: Podiatry

## 2020-05-25 DIAGNOSIS — L603 Nail dystrophy: Secondary | ICD-10-CM | POA: Diagnosis not present

## 2020-05-25 MED ORDER — TERBINAFINE HCL 250 MG PO TABS: 250.0000 mg | ORAL_TABLET | Freq: Every day | ORAL | 0 refills | Status: DC

## 2020-05-25 NOTE — Progress Notes (Signed)
She presents today for follow-up of her onychomycosis states that I do still think these nails are going to get any better she refers to the lateral nails of the right foot states there still was somewhat tender.  Objective: Vital signs are stable she is alert oriented x3 pulses are palpable.  The majority of her nails are clearing very nicely better she does have some dystrophic nails on the third and fourth of the right foot.  These are moderately tender on palpation I debrided them for her today.  Assessment: Long-term therapy with Lamisil.  Plan: At this point I am going to roomette recommend that she continue Lamisil 1 tablet every other day until the other nails are completely grown out.  I will follow-up with her in 3 months if not improved by then I would just recommend not continuing therapy.

## 2020-05-27 ENCOUNTER — Telehealth: Payer: Self-pay

## 2020-05-27 ENCOUNTER — Other Ambulatory Visit: Payer: Self-pay

## 2020-05-27 ENCOUNTER — Ambulatory Visit (HOSPITAL_COMMUNITY)
Admission: RE | Admit: 2020-05-27 | Discharge: 2020-05-27 | Disposition: A | Discharge status: Home / Self Care | Payer: Medicare Other | Source: Ambulatory Visit | Attending: Cardiology | Admitting: Cardiology

## 2020-05-27 DIAGNOSIS — M79606 Pain in leg, unspecified: Secondary | ICD-10-CM | POA: Diagnosis not present

## 2020-05-27 DIAGNOSIS — E785 Hyperlipidemia, unspecified: Secondary | ICD-10-CM | POA: Diagnosis not present

## 2020-05-27 DIAGNOSIS — R209 Unspecified disturbances of skin sensation: Secondary | ICD-10-CM | POA: Diagnosis not present

## 2020-05-27 DIAGNOSIS — Z794 Long term (current) use of insulin: Secondary | ICD-10-CM | POA: Diagnosis not present

## 2020-05-27 DIAGNOSIS — E1142 Type 2 diabetes mellitus with diabetic polyneuropathy: Secondary | ICD-10-CM

## 2020-05-27 DIAGNOSIS — I10 Essential (primary) hypertension: Secondary | ICD-10-CM

## 2020-05-27 DIAGNOSIS — E11319 Type 2 diabetes mellitus with unspecified diabetic retinopathy without macular edema: Secondary | ICD-10-CM | POA: Insufficient documentation

## 2020-05-27 NOTE — Telephone Encounter (Signed)
Pt called in to r/s an appt that she had to cx      Priscilla [name]

## 2020-05-28 ENCOUNTER — Encounter: Payer: Self-pay | Admitting: Family Medicine

## 2020-05-28 DIAGNOSIS — R0989 Other specified symptoms and signs involving the circulatory and respiratory systems: Secondary | ICD-10-CM | POA: Insufficient documentation

## 2020-06-03 ENCOUNTER — Encounter: Payer: Self-pay | Admitting: Family

## 2020-06-03 ENCOUNTER — Inpatient Hospital Stay: Payer: Medicare Other | Attending: Family

## 2020-06-03 ENCOUNTER — Other Ambulatory Visit: Payer: Self-pay

## 2020-06-03 ENCOUNTER — Inpatient Hospital Stay (HOSPITAL_BASED_OUTPATIENT_CLINIC_OR_DEPARTMENT_OTHER): Payer: Medicare Other | Admitting: Family

## 2020-06-03 ENCOUNTER — Telehealth: Payer: Self-pay

## 2020-06-03 VITALS — BP 128/68 | HR 66 | Temp 98.7°F | Resp 17 | Ht 64.0 in | Wt 156.0 lb

## 2020-06-03 DIAGNOSIS — D508 Other iron deficiency anemias: Secondary | ICD-10-CM | POA: Insufficient documentation

## 2020-06-03 DIAGNOSIS — K909 Intestinal malabsorption, unspecified: Secondary | ICD-10-CM

## 2020-06-03 DIAGNOSIS — Z9884 Bariatric surgery status: Secondary | ICD-10-CM | POA: Diagnosis not present

## 2020-06-03 LAB — CBC WITH DIFFERENTIAL (CANCER CENTER ONLY)
Abs Immature Granulocytes: 0.02 10*3/uL (ref 0.00–0.07)
Basophils Absolute: 0.1 10*3/uL (ref 0.0–0.1)
Basophils Relative: 1 %
Eosinophils Absolute: 0.3 10*3/uL (ref 0.0–0.5)
Eosinophils Relative: 4 %
HCT: 42.2 % (ref 36.0–46.0)
Hemoglobin: 13.6 g/dL (ref 12.0–15.0)
Immature Granulocytes: 0 %
Lymphocytes Relative: 25 %
Lymphs Abs: 1.8 10*3/uL (ref 0.7–4.0)
MCH: 29.2 pg (ref 26.0–34.0)
MCHC: 32.2 g/dL (ref 30.0–36.0)
MCV: 90.8 fL (ref 80.0–100.0)
Monocytes Absolute: 0.6 10*3/uL (ref 0.1–1.0)
Monocytes Relative: 9 %
Neutro Abs: 4.4 10*3/uL (ref 1.7–7.7)
Neutrophils Relative %: 61 %
Platelet Count: 293 10*3/uL (ref 150–400)
RBC: 4.65 MIL/uL (ref 3.87–5.11)
RDW: 12.2 % (ref 11.5–15.5)
WBC Count: 7.2 10*3/uL (ref 4.0–10.5)
nRBC: 0 % (ref 0.0–0.2)

## 2020-06-03 LAB — CMP (CANCER CENTER ONLY)
ALT: 10 U/L (ref 0–44)
AST: 11 U/L — ABNORMAL LOW (ref 15–41)
Albumin: 4.3 g/dL (ref 3.5–5.0)
Alkaline Phosphatase: 48 U/L (ref 38–126)
Anion gap: 5 (ref 5–15)
BUN: 25 mg/dL — ABNORMAL HIGH (ref 8–23)
CO2: 33 mmol/L — ABNORMAL HIGH (ref 22–32)
Calcium: 9.8 mg/dL (ref 8.9–10.3)
Chloride: 101 mmol/L (ref 98–111)
Creatinine: 0.84 mg/dL (ref 0.44–1.00)
GFR, Estimated: >60 mL/min (ref >60)
Glucose, Bld: 102 mg/dL — ABNORMAL HIGH (ref 70–99)
Potassium: 4.8 mmol/L (ref 3.5–5.1)
Sodium: 139 mmol/L (ref 135–145)
Total Bilirubin: 0.3 mg/dL (ref 0.3–1.2)
Total Protein: 6.7 g/dL (ref 6.5–8.1)

## 2020-06-03 LAB — FERRITIN: Ferritin: 173 ng/mL (ref 11–307)

## 2020-06-03 LAB — RETICULOCYTES
Immature Retic Fract: 7.3 % (ref 2.3–15.9)
RBC.: 4.61 MIL/uL (ref 3.87–5.11)
Retic Count, Absolute: 60.9 10*3/uL (ref 19.0–186.0)
Retic Ct Pct: 1.3 % (ref 0.4–3.1)

## 2020-06-03 LAB — IRON AND TIBC
Iron: 78 ug/dL (ref 41–142)
Saturation Ratios: 27 % (ref 21–57)
TIBC: 285 ug/dL (ref 236–444)
UIBC: 207 ug/dL (ref 120–384)

## 2020-06-03 NOTE — Telephone Encounter (Signed)
appts made and printed for pt per 06/03/20 los    anne 

## 2020-06-03 NOTE — Progress Notes (Signed)
Hematology and Oncology Follow Up Visit  Priscilla [name] [id number] 01/06/1951 70 y.o. 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 She is doing fairly well but still notes fatigue around mid day.  She is staying busy caring for Priscilla [name] and Priscilla [name]. This can be a hard job at times for Priscilla.  She has not noted any blood loss. No abnormal bruising, no petechiae.  No fever, chills, n/v, cough, rash, SOB, chest pain, palpitations, abdominal pain or changes in bowel or bladder habits.  She has occasional dizziness if she stands too quickly.  No falls or syncope to report.  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  She goes periodically for steroid injections in the lower back and right hip which she states are helpful.  She has maintained a good appetite and is doing Priscilla best to stay well hydrated. Priscilla weight is stable at 156 lbs.   ECOG Performance Status: 1 - Symptomatic but completely ambulatory  Medications:  Allergies as of 06/03/2020   No Known Allergies     Medication List       Accurate as of June 03, 2020  8:57 AM. If you have any questions, ask your nurse or doctor.        albuterol 108 (90 Base) MCG/ACT inhaler Commonly known as: VENTOLIN HFA Inhale 2 puffs into the lungs every 6 (six) hours as needed for wheezing or shortness of breath.   aspirin 81 MG tablet Take 81 mg by mouth daily.   Biotin 5000 MCG Tabs Take 5,000 mcg by mouth daily.   calcium-vitamin D 250-100 MG-UNIT tablet Take 1 tablet by mouth daily.   DULoxetine 60 MG capsule Commonly known as: CYMBALTA Take 1 capsule (60 mg total) by mouth daily.   FreeStyle Libre 2 Sensor Misc Use sensors with CGM to check sugars.   glucose blood test strip Commonly known as: OneTouch  Verio USE TO TEST BLOOD SUGAR TWICE A DAY DX: E11.319   insulin detemir 100 UNIT/ML injection Commonly known as: Levemir Inject 0.15 mLs (15 Units total) into the skin at bedtime.   Insulin Syringe-Needle U-100 27G X 5/8" 1 ML Misc Commonly known as: B-D INS SYR MICROFINE 1CC/27G Use to inject levemir once daily DX: E11.319   lisinopril 2.5 MG tablet Commonly known as: ZESTRIL TAKE 1 TABLET(2.5 MG) BY MOUTH DAILY   metFORMIN 1000 MG tablet Commonly known as: GLUCOPHAGE TAKE 1 TABLET BY MOUTH TWICE DAILY WITH MEALS   metoprolol tartrate 25 MG tablet Commonly known as: LOPRESSOR TAKE 1 TABLET(25 MG) BY MOUTH TWICE DAILY   multivitamin tablet Take 1 tablet by mouth daily.   NovoLOG FlexPen 100 UNIT/ML FlexPen Generic drug: insulin aspart Inject 3-8 Units into the skin 3 (three) times daily with meals.   pravastatin 40 MG tablet Commonly known as: PRAVACHOL Take 1 tablet (40 mg total) by mouth daily.   sertraline 100 MG tablet Commonly known as: ZOLOFT Take 1 tablet (100 mg total) by mouth daily.   terbinafine 250 MG tablet Commonly known as: LamISIL Take 1 tablet (250 mg total) by mouth daily.   VITAMIN B12 PO Place under the tongue.   Vitamin D (Ergocalciferol) 1.25 MG (50000 UNIT) Caps capsule Commonly known as: DRISDOL TAKE 1 CAPSULE BY MOUTH EVERY 7 DAYS   zolpidem  5 MG tablet Commonly known as: AMBIEN TAKE 1 TABLET(5 MG) BY MOUTH AT BEDTIME AS NEEDED FOR SLEEP       Allergies: No Known Allergies  Past Medical History, Surgical history, Social history, and Family History were reviewed and updated.  Review of Systems: All other 10 point review of systems is negative.   Physical Exam:  height is 5\' 4"  (1.626 m) and weight is 156 lb (70.8 kg). Priscilla oral temperature is 98.7 F (37.1 C). Priscilla blood pressure is 128/68 and Priscilla pulse is 66. Priscilla respiration is 17 and oxygen saturation is 100%.   Wt Readings from Last 3 Encounters:  06/03/20 156 lb (70.8 kg)   05/11/20 156 lb (70.8 kg)  03/25/20 152 lb 9.6 oz (69.2 kg)    Ocular: Sclerae unicteric, pupils equal, round and reactive to light Ear-nose-throat: Oropharynx clear, dentition fair Lymphatic: No cervical or supraclavicular adenopathy Lungs no rales or rhonchi, good excursion bilaterally Heart regular rate and rhythm, no murmur appreciated Abd soft, nontender, positive bowel sounds MSK no focal spinal tenderness, no joint edema Neuro: non-focal, well-oriented, appropriate affect Breasts: Deferred   Lab Results  Component Value Date   WBC 7.2 06/03/2020   HGB 13.6 06/03/2020   HCT 42.2 06/03/2020   MCV 90.8 06/03/2020   PLT 293 06/03/2020   Lab Results  Component Value Date   FERRITIN 98 11/19/2019   IRON 58 11/19/2019   TIBC 288 11/19/2019   UIBC 229 11/19/2019   IRONPCTSAT 20 (L) 11/19/2019   Lab Results  Component Value Date   RETICCTPCT 1.3 06/03/2020   RBC 4.61 06/03/2020   No results found for: KPAFRELGTCHN, LAMBDASER, KAPLAMBRATIO No results found for: IGGSERUM, IGA, IGMSERUM No results found for: Odetta Pink, SPEI   Chemistry      Component Value Date/Time   NA 139 06/03/2020 0759   NA 141 10/13/2016 1503   K 4.8 06/03/2020 0759   K 4.9 10/13/2016 1503   CL 101 06/03/2020 0759   CL 103 10/13/2016 1503   CO2 33 (H) 06/03/2020 0759   CO2 28 10/13/2016 1503   BUN 25 (H) 06/03/2020 0759   BUN 16 10/13/2016 1503   CREATININE 0.84 06/03/2020 0759   CREATININE 0.90 10/13/2016 1503      Component Value Date/Time   CALCIUM 9.8 06/03/2020 0759   CALCIUM 9.5 10/13/2016 1503   ALKPHOS 48 06/03/2020 0759   ALKPHOS 72 10/13/2016 1503   AST 11 (L) 06/03/2020 0759   ALT 10 06/03/2020 0759   BILITOT 0.3 06/03/2020 0759       Impression and Plan: Priscilla [name] is a very pleasant 70 yo caucasian female with iron deficiency anemia secondary to malabsorption after lap band surgery. Iron studies are pending.  We will replace if needed.  Follow-up in 6 months.  She can contact our office with any questions or concerns.   Laverna Peace, NP 2/24/20228:57 AM

## 2020-06-07 ENCOUNTER — Other Ambulatory Visit: Payer: Self-pay

## 2020-06-07 ENCOUNTER — Encounter: Payer: Self-pay | Admitting: Internal Medicine

## 2020-06-07 ENCOUNTER — Other Ambulatory Visit: Payer: Self-pay | Admitting: Family

## 2020-06-07 ENCOUNTER — Ambulatory Visit (INDEPENDENT_AMBULATORY_CARE_PROVIDER_SITE_OTHER): Payer: Medicare Other | Admitting: Internal Medicine

## 2020-06-07 VITALS — BP 120/82 | HR 64 | Ht 64.0 in | Wt 159.2 lb

## 2020-06-07 DIAGNOSIS — E785 Hyperlipidemia, unspecified: Secondary | ICD-10-CM

## 2020-06-07 DIAGNOSIS — Z794 Long term (current) use of insulin: Secondary | ICD-10-CM

## 2020-06-07 DIAGNOSIS — E11319 Type 2 diabetes mellitus with unspecified diabetic retinopathy without macular edema: Secondary | ICD-10-CM | POA: Diagnosis not present

## 2020-06-07 DIAGNOSIS — E1142 Type 2 diabetes mellitus with diabetic polyneuropathy: Secondary | ICD-10-CM | POA: Diagnosis not present

## 2020-06-07 DIAGNOSIS — E559 Vitamin D deficiency, unspecified: Secondary | ICD-10-CM

## 2020-06-07 LAB — POCT GLYCOSYLATED HEMOGLOBIN (HGB A1C): Hemoglobin A1C: 6.2 % — AB (ref 4.0–5.6)

## 2020-06-07 MED ORDER — INSULIN DETEMIR 100 UNIT/ML ~~LOC~~ SOLN: 13.0000 [IU] | Freq: Every day | SUBCUTANEOUS | 3 refills | Status: DC

## 2020-06-07 MED ORDER — ONETOUCH VERIO VI STRP: ORAL_STRIP | 11 refills | Status: DC

## 2020-06-07 MED ORDER — "BD INSULIN SYRINGE MICROFINE 27G X 5/8"" 1 ML MISC": 3 refills | Status: DC

## 2020-06-07 NOTE — Progress Notes (Signed)
Patient ID: Priscilla [name], female   DOB: [birth year], 70 y.o.   MRN: [id number]  This visit occurred during the SARS-CoV-2 public health emergency.  Safety protocols were in place, including screening questions prior to the visit, additional usage of staff PPE, and extensive cleaning of exam room while observing appropriate contact time as indicated for disinfecting solutions.   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 She previously saw Drs Dwyane Dee (distant past) and [place] (more recently). Last visit with me 5 months ago.  She continues to have a lot of stress at home due to her husband having vascular dementia.  She had iron infusions in the past.  She contacted me with low blood sugars in 03/2020.  Reviewed HbA1c levels: Lab Results  Component Value Date   HGBA1C 6.3 (A) 12/30/2019   HGBA1C 6.3 (A) 08/25/2019   HGBA1C 5.9 (A) 04/24/2019   HGBA1C 6.0 (A) 02/13/2019   HGBA1C 6.1 (A) 09/20/2018   HGBA1C 6.4 (A) 05/23/2018   HGBA1C 6.0 (A) 10/17/2017   HGBA1C 6.3 06/13/2017   HGBA1C 5.9 11/24/2016   HGBA1C 6.5 (H) 04/22/2016   HGBA1C 7.4 05/18/2015   HGBA1C 7.3 (A) 12/10/2014   HGBA1C 8.0 (H) 06/05/2014   HGBA1C 7.6 (H) 09/26/2013   HGBA1C 7.3 (H) 02/07/2013  03/16/2017: HbA1c calculated from fructosamine: 5.6% 10/18/2015: HbA1c calculated from fructosamine: 5.7% 02/2015: HbA1c 7.4%, HbA1c calculated from fructosamine: 5.6%  She is on: - Metformin 1000 mg 2x a day with meals - Levemir 30 >> 15 units 2x a day >> 15 units >> 10 >> 18 >> 15 units at bedtime - Mealtime Novolog: 4 to 8 units with dinner (at the start of the meal) >> 3-4 units 15-30 min before    If sugars before the meal are 60 or lower, please do not take the Novolog dose. If sugars before the meal are 61-80, take only half of the Novolog dose. She was on Victoza 2.5 mg daily  >> stopped as she could not afford this.  Pt.checks her sugars more than 4 times a day with her CGM.  Freestyle libre CGM parameters: - Average:120 >> 128 >> 126 - % active CGM time: 95% >> 96% >> 79% of the time - Glucose variability 23.7%  >> 20.6% >> 23.9% (target < or = to 36%) - time in range:  - very low (<54): 0% >> 0% >> 0% - low (54-69): 0% >> 0% >> 0% - normal range (70-180): 97% >> 95% >> 94% - high sugars (181-250): 3% >> 5% >> 6% - very high sugars (>250): 0% >> 0% >> 0%    Previously:   Previously:   Lowest sugar was42 >> 60; she has hypoglycemia awareness in the in the 70s. Highest sugar was 285 x1 (cake) ... >> 200.  Glucometer: Freestyle  -No CKD; last BUN/creatinine:  Lab Results  Component Value Date   BUN 25 (H) 06/03/2020   CREATININE 0.84 06/03/2020  ACR (12/2014): 6.9 On lisinopril 1.25 mg daily -+ HL; last set of lipids: Lab Results  Component Value Date   CHOL 202 (H) 05/11/2020   HDL 71.30 05/11/2020   LDLCALC 115 (H) 05/11/2020   TRIG 79.0 05/11/2020   CHOLHDL 3 05/11/2020  On atorvastatin 20. 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  She has floaters. -+ numbness, tingling, burning in her feet - stable.  On B12.  I recommended alpha lipoic acid but she did not take this due to the large size of the pill.  Before last visit she started Neurontin 100 mg daily per podiatry, now off 2/2 being also on Ambien  Latest TSH normal: Lab Results  Component Value Date   TSH 2.93 05/11/2020   She has a history of lap band surgery >> she does not usually eat large meals but she usually grazes, especially at night  ROS: Constitutional: no weight gain/no weight loss, no fatigue, no subjective hyperthermia, no subjective hypothermia Eyes: no blurry vision, no xerophthalmia ENT: no sore throat, no nodules palpated in neck, no dysphagia, no odynophagia, no  hoarseness Cardiovascular: no CP/no SOB/no palpitations/no leg swelling Respiratory: no cough/no SOB/no wheezing Gastrointestinal: no N/no V/no D/no C/no acid reflux Musculoskeletal: no muscle aches/no joint aches Skin: no rashes, no hair loss Neurological: no tremors/+ numbness/+ tingling/no dizziness  I reviewed pt's medications, allergies, PMH, social hx, family hx, and changes were documented in the history of present illness. Otherwise, unchanged from my initial visit note.  Past Medical History:  Diagnosis Date  . Anemia 12/14/2008  . Anxiety and depression 12/14/2008  . Asthma 11/12/2006  . CAD (coronary artery disease) 12/14/2008   hx transluminal coronary angioplasty  . Cataract   . Depression   . Diabetes mellitus with neuropathy ([place]) 12/14/2008  . Fibromyalgia 01/04/2007   sees rheuamtology  . 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 Hyperlipemia 01/21/2010  . Hypertension 12/14/2008  . Leg edema 12/14/2008  . 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 OSA (obstructive sleep apnea) 08/06/2008  . Osteoarthritis 12/14/2008  . Palpitations 11/12/2006   hx sinus tachy  . Psoriatic arthritis [facility])    sees rheumatology  . Rotator cuff injury    Past Surgical History:  Procedure Laterality Date  . ABDOMINAL HYSTERECTOMY    . CHOLECYSTECTOMY    . LAPAROSCOPIC GASTRIC BANDING    . PTCA    . WRIST SURGERY     LEFT   Social History   Social History  . Marital Status: Married    Spouse Name: N/A  . Number of Children: N/A   Occupational History  . Not on file.   Social History Main Topics  . Smoking status: Never Smoker   . Smokeless tobacco: Never Used  . Alcohol Use: Yes     Comment: glass of wine-specially occasion  . Drug Use: No   Current Outpatient Medications on File Prior to Visit  Medication Sig Dispense Refill  . albuterol (PROVENTIL HFA;VENTOLIN HFA) 108 (90 Base) MCG/ACT inhaler Inhale 2 puffs into the lungs every 6 (six) hours as needed for wheezing or shortness  of breath. 1 Inhaler 1  . aspirin 81 MG tablet Take 81 mg by mouth daily.    . Biotin 5000 MCG TABS Take 5,000 mcg by mouth daily.     . calcium-vitamin D 250-100 MG-UNIT per tablet Take 1 tablet by mouth daily.     . Continuous Blood Gluc Sensor (FREESTYLE LIBRE 2 SENSOR) MISC Use sensors with CGM to check sugars. 6 each 3  . Cyanocobalamin (VITAMIN B12 PO) Place under the tongue.    . DULoxetine (CYMBALTA) 60 MG capsule Take 1 capsule (60 mg total) by mouth daily. 30 capsule 5  . glucose blood (ONETOUCH VERIO) test strip USE TO TEST BLOOD SUGAR TWICE A DAY DX: E11.319 200 each 0  .  insulin aspart (NOVOLOG FLEXPEN) 100 UNIT/ML FlexPen Inject 3-8 Units into the skin 3 (three) times daily with meals. 30 mL 3  . insulin detemir (LEVEMIR) 100 UNIT/ML injection Inject 0.15 mLs (15 Units total) into the skin at bedtime. 30 mL 3  . Insulin Syringe-Needle U-100 (B-D INS SYR MICROFINE 1CC/27G) 27G X 5/8" 1 ML MISC Use to inject levemir once daily DX: E11.319 120 each 6  . lisinopril (ZESTRIL) 2.5 MG tablet TAKE 1 TABLET(2.5 MG) BY MOUTH DAILY 90 tablet 1  . metFORMIN (GLUCOPHAGE) 1000 MG tablet TAKE 1 TABLET BY MOUTH TWICE DAILY WITH MEALS 180 tablet 3  . metoprolol tartrate (LOPRESSOR) 25 MG tablet TAKE 1 TABLET(25 MG) BY MOUTH TWICE DAILY 60 tablet 5  . Multiple Vitamin (MULTIVITAMIN) tablet Take 1 tablet by mouth daily.     . pravastatin (PRAVACHOL) 40 MG tablet Take 1 tablet (40 mg total) by mouth daily. 30 tablet 11  . sertraline (ZOLOFT) 100 MG tablet Take 1 tablet (100 mg total) by mouth daily. 30 tablet 5  . terbinafine (LAMISIL) 250 MG tablet Take 1 tablet (250 mg total) by mouth daily. 30 tablet 0  . Vitamin D, Ergocalciferol, (DRISDOL) 1.25 MG (50000 UNIT) CAPS capsule TAKE 1 CAPSULE BY MOUTH EVERY 7 DAYS 4 capsule 6  . zolpidem (AMBIEN) 5 MG tablet TAKE 1 TABLET(5 MG) BY MOUTH AT BEDTIME AS NEEDED FOR SLEEP 30 tablet 2   No current facility-administered medications on file prior to visit.    No Known Allergies Family History  Problem Relation Age of Onset  . Arthritis Mother   . Alzheimer's disease Mother   . Stroke Father   . Alcohol abuse Father   . Diabetes Father   . Heart disease Father   . Early death Father 22  . Diabetes Sister   . Arthritis Sister   . COPD Sister   . Arthritis Maternal Aunt   . Diabetes Maternal Aunt   . Early death Maternal Aunt 84  . Diabetes Maternal Uncle   . Cancer Paternal Aunt   . COPD Paternal 10   . Heart disease Paternal Aunt   . Heart disease Paternal Uncle   . Alcohol abuse Paternal Grandfather   . Heart disease Paternal Grandfather   . Early death Paternal Grandfather 49  . Stroke Paternal Grandfather   . Hypertension Son   . Autoimmune disease Daughter   . Breast cancer Neg Hx    PE: LMP  (LMP Unknown)  There is no height or weight on file to calculate BMI. Wt Readings from Last 3 Encounters:  06/03/20 156 lb (70.8 kg)  05/11/20 156 lb (70.8 kg)  03/25/20 152 lb 9.6 oz (69.2 kg)   Constitutional: Normal weight, in NAD Eyes: PERRLA, EOMI, no exophthalmos ENT: moist mucous membranes, no thyromegaly, no cervical lymphadenopathy Cardiovascular: RRR, No MRG Respiratory: CTA B Gastrointestinal: abdomen soft, NT, ND, BS+ Musculoskeletal: no deformities, strength intact in all 4 Skin: moist, warm, no rashes Neurological: no tremor with outstretched hands, DTR normal in all 4  ASSESSMENT: 1. DM2, insulin-dependent, uncontrolled, with complications - Diabetic retinopathy left eye - resolved - Peripheral neuropathy  Component     Latest Ref Rng & Units 04/24/2019  Hemoglobin A1C     4.0 - 5.6 % 5.9 (A)  Islet Cell Ab     Neg:<1:1 Negative  ZNT8 Antibodies     U/mL <15  C-Peptide     0.80 - 3.85 ng/mL 2.35  Glucose, Plasma  65 - 99 mg/dL 130 (H)  Glutamic Acid Decarb Ab     <5 IU/mL <5  No insulin deficiency or pancreatic autoimmunity.  2. HL  3. PN   PLAN:  1. Patient with longstanding,  previously uncontrolled type 2 diabetes, on Metformin and basal-bolus insulin regimen, with improved control in the last few years.  After the gastric bypass surgery, she was grazing and not having large meals and we reduced her insulin doses.  At last visit, 95% of her glucose levels were in target range per CGM analysis and we did not change her regimen at that time.  She was complaining that her insulin was expensive and I advised her to check with her insurance with other analogs are covered.  HbA1c then was excellent, stable 6.3%.  At this visit, she tells me that after she changed her pharmacy, her insulins are affordable. CGM interpretation: -At today's visit, we reviewed her CGM downloads: It appears that 94% of values are in target range (goal >70%), while6% are higher than 180 (goal <25%), and 0% are lower than 70 (goal <4%).  The calculated average blood sugar is 126.  The projected HbA1c for the next 3 months (GMI) is 6.3%. -Reviewing the CGM trends, it appears that her blood sugars are excellent at night, in the lower normal interval they increase slightly after breakfast but still well within target and they were also increasing after dinner, but then decreasing overnight.  Upon questioning, she tells me that she is mostly injecting NovoLog before dinner, not before the rest of the meals.  Also, many times she skips the NovoLog dose if sugars are lower than 120.  At this visit we will then reduce the dose of Levemir slightly so she can inject the NovoLog consistently.  We can also move Levemir in the morning, if needed, at next visit. - I advised her to:  Patient Instructions  Please continue: - Metformin 1000 mg 2x a day with meals - Novolog 3-5 units 15 minutes before meals  Please decrease: - Levemir to 13 units at bedtime  Please come back for a follow-up appointment in 4 months.  - we checked her HbA1c: 6.2% (slightly better) - advised to check sugars at different times of the day  - 4x a day, rotating check times - advised for yearly eye exams >> she is UTD - return to clinic in 4 months   2. HL -Reviewed latest lipid panel from earlier this month: LDL above target, the rest of the fractions at goal: Lab Results  Component Value Date   CHOL 202 (H) 05/11/2020   HDL 71.30 05/11/2020   LDLCALC 115 (H) 05/11/2020   TRIG 79.0 05/11/2020   CHOLHDL 3 05/11/2020  -Continues pravastatin 20 without side effects  3. PN -Related to diabetes -Stable - She could not take alpha-lipoic acid as suggested in the past, as this was a large tablet. -She continues on B12, Cymbalta, and Neurontin per podiatry.  These are helping.  However, she seldom takes  Neurontin anymore -She could not sw as she was advised not to take it along with Ambien.  I advised her to maybe skip Ambien some nights that she can take the Neurontin.    Philemon Kingdom, MD PhD [facility] Endocrinology

## 2020-06-07 NOTE — Patient Instructions (Addendum)
Please continue: - Metformin 1000 mg 2x a day with meals - Novolog 3-5 units 15 minutes before meals  Please decrease: - Levemir to 13 units at bedtime  Please come back for a follow-up appointment in 4 months.

## 2020-06-29 DIAGNOSIS — L820 Inflamed seborrheic keratosis: Secondary | ICD-10-CM | POA: Diagnosis not present

## 2020-06-29 DIAGNOSIS — D2339 Other benign neoplasm of skin of other parts of face: Secondary | ICD-10-CM | POA: Diagnosis not present

## 2020-06-29 DIAGNOSIS — L814 Other melanin hyperpigmentation: Secondary | ICD-10-CM | POA: Diagnosis not present

## 2020-06-29 DIAGNOSIS — D1801 Hemangioma of skin and subcutaneous tissue: Secondary | ICD-10-CM | POA: Diagnosis not present

## 2020-06-29 DIAGNOSIS — L821 Other seborrheic keratosis: Secondary | ICD-10-CM | POA: Diagnosis not present

## 2020-06-29 DIAGNOSIS — D2239 Melanocytic nevi of other parts of face: Secondary | ICD-10-CM | POA: Diagnosis not present

## 2020-06-29 DIAGNOSIS — L218 Other seborrheic dermatitis: Secondary | ICD-10-CM | POA: Diagnosis not present

## 2020-06-29 DIAGNOSIS — L720 Epidermal cyst: Secondary | ICD-10-CM | POA: Diagnosis not present

## 2020-07-02 ENCOUNTER — Other Ambulatory Visit: Payer: Self-pay | Admitting: Rheumatology

## 2020-07-02 NOTE — Telephone Encounter (Signed)
Next Visit: 09/23/2020  Last Visit: 03/25/2020  Last Fill: 04/01/2020  Dx: Other insomnia   Current Dose per office note on 03/25/2020,  Ambien 5 mg 1 tablet by mouth at bedtime.   Okay to refill Ambien?

## 2020-07-06 ENCOUNTER — Telehealth: Payer: Self-pay

## 2020-07-06 NOTE — Telephone Encounter (Signed)
Last OV notes routed to [phone] via Epic

## 2020-07-08 ENCOUNTER — Telehealth: Payer: Self-pay | Admitting: Internal Medicine

## 2020-07-08 DIAGNOSIS — E11319 Type 2 diabetes mellitus with unspecified diabetic retinopathy without macular edema: Secondary | ICD-10-CM

## 2020-07-08 MED ORDER — ONETOUCH VERIO VI STRP: ORAL_STRIP | 3 refills | Status: DC

## 2020-07-08 NOTE — Telephone Encounter (Signed)
MEDICATION: One Touch Verio Test Strips  PHARMACY:  [facility] on [address] at [place] CONTACTED [place]?  yes  IS THIS A 90 DAY SUPPLY :   IS PATIENT OUT OF MEDICATION: yes  IF NOT; HOW MUCH IS LEFT:   LAST APPOINTMENT DATE: @3 /29/2022  NEXT APPOINTMENT DATE:@6 /28/2022  DO WE HAVE YOUR PERMISSION TO LEAVE A DETAILED MESSAGE?: yes  OTHER COMMENTS:    **Let patient know to contact pharmacy at the end of the day to make sure medication is ready. **  ** Please notify patient to allow 48-72 hours to process**  **Encourage patient to contact the pharmacy for refills or they can request refills through [facility]**

## 2020-07-08 NOTE — Telephone Encounter (Signed)
Rx sent to preferred pharmacy.

## 2020-07-13 MED ORDER — ONETOUCH VERIO VI STRP: ORAL_STRIP | 3 refills | Status: DC

## 2020-07-13 NOTE — Addendum Note (Signed)
Addended by: Lauralyn Primes on: 07/13/2020 02:22 PM   Modules accepted: Orders

## 2020-07-13 NOTE — Telephone Encounter (Signed)
Patient called back to advise that her pharmacy does not have this refill.  It does not have a receipt confirming they got it.  Can it please be re-sent to pharmacy

## 2020-07-15 ENCOUNTER — Ambulatory Visit: Payer: Medicare Other | Admitting: Internal Medicine

## 2020-07-27 ENCOUNTER — Telehealth: Payer: Self-pay | Admitting: Internal Medicine

## 2020-07-27 NOTE — Telephone Encounter (Signed)
OV notes routed to [phone] via Epic

## 2020-07-27 NOTE — Telephone Encounter (Signed)
Please refax OV to [phone] ( number provided on 07/06/20 was a phone number)

## 2020-07-27 NOTE — Telephone Encounter (Signed)
OV notes routed to [phone] via Epic

## 2020-07-27 NOTE — Telephone Encounter (Signed)
Pt calling regarding Continuous Blood Gluc Sensor (FREESTYLE LIBRE 2 SENSOR) MISC  Pt is needing a refill on the sensors. She only have 7 days left on the sensor she is on.    [facility] are  who sends these sensors to the pt  and they are going to fax over the information needed from the Provider. Fax number was given to the patient.

## 2020-07-28 ENCOUNTER — Ambulatory Visit: Payer: Medicare Other

## 2020-07-28 ENCOUNTER — Other Ambulatory Visit: Payer: Medicare Other

## 2020-08-02 ENCOUNTER — Other Ambulatory Visit: Payer: Self-pay | Admitting: Physician Assistant

## 2020-08-03 ENCOUNTER — Telehealth: Payer: Self-pay

## 2020-08-03 NOTE — Telephone Encounter (Signed)
I called patient, Ambien sent to pharmacy 08/03/2020 w/confirmed receipt, patient will call pharmacy.

## 2020-08-03 NOTE — Telephone Encounter (Signed)
Next Visit:  03/25/2020  Last Visit: 09/23/2020  Last Fill: 07/02/2020  Dx: Psoriatic arthritis   Current Dose per office note on 03/25/2020, Ambien 5 mg 1 tablet by mouth at bedtime  Okay to refill Ambien?

## 2020-08-03 NOTE — Telephone Encounter (Signed)
Patient called stating she received a text message this morning stating her prescription of Zolpidem has been filled and then another text stating that Dr. Estanislado Pandy denied the refill request.  Patient was told that it was e-prescribed to [facility] today 08/03/20 at 8:04 am and receipt confirmed by pharmacy.

## 2020-08-19 ENCOUNTER — Other Ambulatory Visit: Payer: Self-pay | Admitting: *Deleted

## 2020-08-19 NOTE — Patient Outreach (Signed)
[facility]) Care Management  08/19/2020  Priscilla [name] 07-12-1950 [id number]   Telephone Assessment Previously Remote health Initial Outreach  RN attempted outreach call today however unsuccessful. RN able to leave a HIPAA approved voice message requesting a call back. Will further engage at that time on any potential needs.  Will attempted another outreach call over the next week and sent outreach letter accordingly.  Raina Mina, RN Care Management Coordinator [place] Office [phone]

## 2020-08-25 ENCOUNTER — Other Ambulatory Visit: Payer: Self-pay | Admitting: *Deleted

## 2020-08-25 NOTE — Patient Outreach (Signed)
[facility]) Care Management  08/25/2020  Priscilla [name] 1950/07/14 [id number]   Telephone Assessment-Unsuccessful Outreach #2  RN attempted outreach call however unsuccessful. RN able to leave a HIPAA approved voice message requesting a call back.  RN will attempted another outreach call over the next week for pending services.  Raina Mina, RN Care Management Coordinator [place] Office [phone]

## 2020-08-26 ENCOUNTER — Ambulatory Visit (INDEPENDENT_AMBULATORY_CARE_PROVIDER_SITE_OTHER): Payer: Medicare Other | Admitting: Podiatry

## 2020-08-26 ENCOUNTER — Other Ambulatory Visit: Payer: Self-pay

## 2020-08-26 DIAGNOSIS — L603 Nail dystrophy: Secondary | ICD-10-CM | POA: Diagnosis not present

## 2020-08-26 MED ORDER — TERBINAFINE HCL 250 MG PO TABS: 250.0000 mg | ORAL_TABLET | Freq: Every day | ORAL | 0 refills | Status: DC

## 2020-08-29 NOTE — Progress Notes (Signed)
She presents today for follow-up of her onychomycosis denies fever chills nausea vomit muscle aches pains calf pain back pain chest pain shortness of breath itching or rashes.  States that the toenails appear to be growing out very nicely.  Objective: Toenails appear to be growing out very nicely she still has some residual onychomycosis remaining.  Assessment: Long-term therapy with Lamisil.  Plan: Discussed etiology pathology conservative therapies at this point unguinal recommend continued Lamisil therapy 1 tablet every other day until nails are completely grown out I will follow-up with her in 50months.  If completely grown out by that time we will discontinue use of the medication.  Should she have questions or concerns or develop any signs or symptoms she may find more edema consistent with use of this medication she will notify us.

## 2020-08-31 ENCOUNTER — Other Ambulatory Visit: Payer: Self-pay | Admitting: *Deleted

## 2020-08-31 NOTE — Patient Outreach (Signed)
[facility]) Care Management  08/31/2020  Priscilla [name] November 08, 1950 [id number]   Telephone Assessment-Unsuccessful Outreach #3  RN attempted outreach call today however unsuccessful. RN able to leave a HIPAA approved voice message requesting a call back.  Will attempted another outreach over the next month for pending services.  Raina Mina, RN Care Management Coordinator [place] Office [phone]

## 2020-09-01 ENCOUNTER — Other Ambulatory Visit: Payer: Self-pay | Admitting: Physician Assistant

## 2020-09-01 ENCOUNTER — Other Ambulatory Visit: Payer: Self-pay | Admitting: *Deleted

## 2020-09-01 NOTE — Patient Outreach (Signed)
[place] [facility]) Care Management  09/01/2020  Priscilla [name] 01/14/1951 [id number]   Incoming Call/Closure  RN has made several calls to pt for [facility] services however unsuccessful with the outreaches. Pt called the [facility] office and requested to cease all calls and declined services.  Will close this case and notify the provider of pt's request.  Raina Mina, RN Care Management Coordinator [place] Office [phone]

## 2020-09-02 ENCOUNTER — Other Ambulatory Visit: Payer: Self-pay | Admitting: Internal Medicine

## 2020-09-02 NOTE — Telephone Encounter (Signed)
Next Visit: 09/23/2020  Last Visit: 03/25/2020  Last Fill: 08/03/2020   Dx: Other insomnia   Current Dose per office note on 03/25/2020, Ambien 5 mg 1 tablet by mouth at bedtime  Okay to refill Ambien?

## 2020-09-03 ENCOUNTER — Telehealth: Payer: Self-pay | Admitting: Physical Medicine and Rehabilitation

## 2020-09-03 NOTE — Telephone Encounter (Signed)
Patient called requesting a call back to set an back injection appt. Please call patient at [phone].

## 2020-09-07 NOTE — Telephone Encounter (Signed)
ok 

## 2020-09-07 NOTE — Telephone Encounter (Signed)
Bilateral L4-5 facets 09/22/19; right hip injection 02/05/20. Ok to repeat facets?

## 2020-09-07 NOTE — Telephone Encounter (Signed)
Scheduled for 6/6 at 0815 with driver.

## 2020-09-09 NOTE — Progress Notes (Signed)
Office Visit Note  Patient: Priscilla [name]             Date of Birth: 25-Oct-1950           MRN: [id number]             PCP: Ma Hillock, DO Referring: Ma Hillock, DO Visit Date: 09/23/2020 Occupation: @GUAROCC @  Subjective:  Other (Neck pain and tenderness)   History of Present Illness: Priscilla [name] is a 70 y.o. female with a history of psoriatic arthritis, osteoarthritis and degenerative disc disease.  She has not noticed any joint swelling in her hands or any other joints from psoriatic arthritis.  She has had some dryness in her scalp but no psoriasis lesions.  She continues to have some lower back discomfort.  She had recent injections by Dr. Ernestina Patches which were helpful.  Right trochanteric bursa bothers her off and on.  She states she has some discomfort in her feet due to osteoarthritis and neuropathy.  Recently she been having some stiffness in her neck.  She has history of disc disease of cervical spine.  She continues to have some discomfort and fatigue from fibromyalgia.  Activities of Daily Living:  Patient reports morning stiffness for 1 hour.   Patient Reports nocturnal pain.  Difficulty dressing/grooming: Denies Difficulty climbing stairs: Reports Difficulty getting out of chair: Reports Difficulty using hands for taps, buttons, cutlery, and/or writing: Reports  Review of Systems  Constitutional:  Positive for fatigue.  HENT:  Negative for mouth sores, mouth dryness and nose dryness.   Eyes:  Positive for dryness. Negative for pain and itching.  Respiratory:  Positive for shortness of breath. Negative for difficulty breathing.   Cardiovascular:  Negative for chest pain and palpitations.  Gastrointestinal:  Negative for blood in stool, constipation and diarrhea.  Endocrine: Negative for increased urination.  Genitourinary:  Positive for difficulty urinating and involuntary urination.  Musculoskeletal:  Positive for joint pain, joint pain, joint swelling,  myalgias, morning stiffness, muscle tenderness and myalgias.  Skin:  Positive for color change. Negative for rash and redness.  Allergic/Immunologic: Negative for susceptible to infections.  Neurological:  Positive for dizziness, numbness, parasthesias and weakness. Negative for headaches and memory loss.  Hematological:  Negative for bruising/bleeding tendency.  Psychiatric/Behavioral:  Negative for confusion.    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   Gastric bypass status for obesity 08/31/2016   Iron deficiency anemia 04/28/2016   Other insomnia 03/14/2016   DDD (degenerative disc disease), cervical 03/14/2016   Spondylosis without myelopathy or radiculopathy, lumbar region 01/31/2016   Anxiety and depression 06/25/2014   Psoriatic arthritis - sees rheumatologist 02/20/2014   Asthma 12/14/2008   Diabetic peripheral neuropathy associated with type 2 diabetes mellitus ([place]) 08/06/2008   Hyperlipemia 11/12/2006   Essential hypertension 11/12/2006    Past Medical History:  Diagnosis Date   Anemia 12/14/2008   Anxiety and depression 12/14/2008   Asthma 11/12/2006   CAD (coronary artery disease) 12/14/2008   hx transluminal coronary angioplasty   Cataract    Depression    Diabetes mellitus with neuropathy ([place]) 12/14/2008   Fibromyalgia 01/04/2007   sees rheuamtology   Gastric bypass status  for obesity 08/31/2016   Hyperlipemia 01/21/2010   Hypertension 12/14/2008   Leg edema 12/14/2008   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   OSA (obstructive sleep apnea) 08/06/2008   Osteoarthritis 12/14/2008   Palpitations 11/12/2006   hx sinus tachy   Psoriatic arthritis [facility])    sees rheumatology    Rotator cuff injury     Family History  Problem Relation Age of Onset   Arthritis Mother    Alzheimer's disease Mother    Stroke Father    Alcohol abuse Father    Diabetes Father    Heart disease Father    Early death Father 96   Diabetes Sister    Arthritis Sister    COPD Sister    Arthritis Maternal Aunt    Diabetes Maternal Aunt    Early death Maternal Aunt 50   Diabetes Maternal Uncle    Cancer Paternal Aunt    COPD Paternal Aunt    Heart disease Paternal Aunt    Heart disease Paternal Uncle    Alcohol abuse Paternal Grandfather    Heart disease Paternal Grandfather    Early death Paternal Grandfather 29   Stroke Paternal Grandfather    Hypertension Son    Autoimmune disease Daughter    Breast cancer Neg Hx    Past Surgical History:  Procedure Laterality Date   ABDOMINAL HYSTERECTOMY     CHOLECYSTECTOMY     LAPAROSCOPIC GASTRIC BANDING     Lumbar Facet Joint Intra-Articular Injection(s) with Fluoroscopic Guidance  09/13/2020   L4-5; Dr. Ernestina Patches   PTCA     WRIST SURGERY     LEFT   Social History   Social History Narrative   Married, [name]. 2 children name Abe People and Anderson Malta.   Some college, retired Engineer, manufacturing.   Drinks caffeine.   Take a daily vitamin.   Wears her seatbelt, exercises 3 times a week.   Smoke detector in the home.   Firearms in the home.   Feels safe in her relationships.   Immunization History  Administered Date(s) Administered   Fluad Quad(high Dose 65+) 12/03/2018, 01/29/2020   Influenza Split 01/21/2013   Influenza, High Dose Seasonal PF 02/04/2015, 02/09/2016, 02/11/2018   Influenza,inj,Quad PF,6+ Mos 01/15/2014   Influenza-Unspecified 01/26/2017   Moderna Sars-Covid-2 Vaccination 05/24/2019, 06/21/2019, 12/22/2019   Pneumococcal Conjugate-13 02/04/2015   Pneumococcal Polysaccharide-23 10/31/2013, 01/29/2020   Tdap 02/07/2013     Objective: Vital Signs: BP 124/78 (BP Location: Right Arm, Patient Position:  Sitting, Cuff Size: Normal)   Pulse 66   Resp 14   Ht 5\' 4"  (1.626 m)   Wt 161 lb (73 kg)   LMP  (LMP Unknown)   BMI 27.64 kg/m    Physical Exam Vitals and nursing note reviewed.  Constitutional:      Appearance: She is well-developed.  HENT:     Head: Normocephalic and atraumatic.  Eyes:     Conjunctiva/sclera: Conjunctivae normal.  Cardiovascular:     Rate and Rhythm: Normal rate and regular rhythm.     Heart sounds: Normal heart sounds.  Pulmonary:     Effort: Pulmonary effort is normal.     Breath sounds: Normal breath sounds.  Abdominal:     General: Bowel sounds are normal.     Palpations: Abdomen is soft.  Musculoskeletal:     Cervical back: Normal range of motion.  Lymphadenopathy:     Cervical: No cervical adenopathy.  Skin:    General: Skin is warm and dry.  Capillary Refill: Capillary refill takes less than 2 seconds.  Neurological:     Mental Status: She is alert and oriented to person, place, and time.  Psychiatric:        Behavior: Behavior normal.     Musculoskeletal Exam: C-spine was in good range of motion.  She had painful range of motion of her lumbar spine.  Shoulder joints, elbow joints, wrist joints, MCPs PIPs and DIPs with good range of motion.  She had bilateral PIP DIP and CMC thickening with no synovitis.  She had painful limited range of motion of her right hip joint.  She had warmth and swelling in her left knee joint with moderate effusion.  Right knee joint was in good range of motion.  There was no tenderness over ankles or MTPs.  CDAI Exam: CDAI Score: -- Patient Global: --; Provider Global: -- Swollen: --; Tender: -- Joint Exam 09/23/2020   No joint exam has been documented for this visit   There is currently no information documented on the homunculus. Go to the Rheumatology activity and complete the homunculus joint exam.  Investigation: No additional findings.  Imaging: XR C-ARM NO REPORT  Result Date: 09/13/2020 Please  see Notes tab for imaging impression.  XR KNEE 3 VIEW LEFT  Result Date: 09/23/2020 Moderate medial compartment narrowing with medial and intercondylar osteophytes was noted.  Moderate patellofemoral narrowing was noted.  No chondrocalcinosis was noted. Impression: These findings are consistent with moderate osteoarthritis and moderate chondromalacia patella.   Recent Labs: Lab Results  Component Value Date   WBC 7.2 06/03/2020   HGB 13.6 06/03/2020   PLT 293 06/03/2020   NA 139 06/03/2020   K 4.8 06/03/2020   CL 101 06/03/2020   CO2 33 (H) 06/03/2020   GLUCOSE 102 (H) 06/03/2020   BUN 25 (H) 06/03/2020   CREATININE 0.84 06/03/2020   BILITOT 0.3 06/03/2020   ALKPHOS 48 06/03/2020   AST 11 (L) 06/03/2020   ALT 10 06/03/2020   PROT 6.7 06/03/2020   ALBUMIN 4.3 06/03/2020   CALCIUM 9.8 06/03/2020   GFRAA >60 11/19/2019    Speciality Comments: No specialty comments available.  Procedures:  Large Joint Inj: L knee on 09/23/2020 11:09 AM Indications: pain Details: 27 G 1.5 in needle, medial approach  Arthrogram: No  Medications: 40 mg triamcinolone acetonide 40 MG/ML; 3 mL lidocaine 1 % Aspirate: 10 mL Outcome: tolerated well, no immediate complications Procedure, treatment alternatives, risks and benefits explained, specific risks discussed. Consent was given by the patient. Immediately prior to procedure a time out was called to verify the correct patient, procedure, equipment, support staff and site/side marked as required. Patient was prepped and draped in the usual sterile fashion.    Allergies: Patient has no known allergies.   Assessment / Plan:     Visit Diagnoses: Psoriatic arthritis ([place]) - In remission: She had no synovitis on examination.  Psoriasis-no psoriasis patches were noted.  She states she has dry scalp.  Primary osteoarthritis of both hands-osteoarthritis was noted in bilateral hands with DIP and PIP thickening.  Trochanteric bursitis, right hip -  she had cortisone injection by Dr. Ernestina Patches in the past per patient.  IT band stretches were discussed.   Effusion, left knee -she had warmth and swelling in her left knee joint with moderate effusion.  After informed consent was obtained the left knee joint was prepped in sterile fashion and then mL of synovial fluid was aspirated as described above.  She tolerated the procedure  well.  X-ray showed moderate osteoarthritis and moderate chondromalacia patella.  Synovial fluid was sent for analysis.  Plan: XR KNEE 3 VIEW LEFT, Anaerobic and Aerobic Culture, Synovial Fluid Analysis, Complete  Pain in both feet-she continues to have some discomfort in her feet due to neuropathy and osteoarthritis.  DDD (degenerative disc disease), cervical-she complains of increased stiffness in her neck.  X-rays were reviewed.  A handout on C-spine exercises was given.  DDD (degenerative disc disease), lumbar - She is followed by Dr. Ernestina Patches and has had facet joint injections in the past.  She gets relief from cortisone shots.  History of scoliosis  Fibromyalgia - she is on cymbalta 60mg  by mouth daily.  She has intermittent flares of fibromyalgia.  Other fatigue-due to fibromyalgia and insomnia.  Other insomnia - Ambien 5 mg 1 tablet by mouth at bedtime.  Other medical problems are listed as follows:  Gastric bypass status for obesity  History of diabetes mellitus  History of anemia  History of vitamin D deficiency  History of asthma  History of sleep apnea  Anxiety and depression   Orders: Orders Placed This Encounter  Procedures   Large Joint Inj   Anaerobic and Aerobic Culture   XR KNEE 3 VIEW LEFT   Synovial Fluid Analysis, Complete    No orders of the defined types were placed in this encounter.   Follow-Up Instructions: Return in about 2 months (around 11/23/2020) for Psoriatic arthritis, Osteoarthritis.   Bo Merino, MD  Note - This record has been created using Radio producer.  Chart creation errors have been sought, but may not always  have been located. Such creation errors do not reflect on  the standard of medical care.

## 2020-09-13 ENCOUNTER — Other Ambulatory Visit: Payer: Self-pay

## 2020-09-13 ENCOUNTER — Ambulatory Visit: Payer: Self-pay

## 2020-09-13 ENCOUNTER — Ambulatory Visit (INDEPENDENT_AMBULATORY_CARE_PROVIDER_SITE_OTHER): Payer: Medicare Other | Admitting: Physical Medicine and Rehabilitation

## 2020-09-13 ENCOUNTER — Encounter: Payer: Self-pay | Admitting: Physical Medicine and Rehabilitation

## 2020-09-13 VITALS — BP 114/75 | HR 57

## 2020-09-13 DIAGNOSIS — M47816 Spondylosis without myelopathy or radiculopathy, lumbar region: Secondary | ICD-10-CM

## 2020-09-13 HISTORY — PX: OTHER SURGICAL HISTORY: SHX169

## 2020-09-13 MED ORDER — BETAMETHASONE SOD PHOS & ACET 6 (3-3) MG/ML IJ SUSP
12.0000 mg | Freq: Once | INTRAMUSCULAR | Status: AC
  Administered 2020-09-13: 12 mg

## 2020-09-13 NOTE — Progress Notes (Signed)
Priscilla [name] - 70 y.o. female MRN [id number]  Date of birth: April 11, 1950  Office Visit Note: Visit Date: 09/13/2020 PCP: Ma Hillock, DO Referred by: Ma Hillock, DO  Subjective: Chief Complaint  Patient presents with  . Lower Back - Pain   HPI:  Priscilla [name] is a 70 y.o. female who comes in today for planned repeat Bilateral L4-L5 Lumbar facet/medial branch block with fluoroscopic guidance.  The patient has failed conservative care including home exercise, medications, time and activity modification.  This injection will be diagnostic and hopefully therapeutic.  Please see requesting physician notes for further details and justification.  Exam shows concordant low back pain with facet joint loading and extension. Patient received more than 80% pain relief from prior injection. This would be the second block in a diagnostic double block paradigm.  Her case is complicated by fibromyalgia and peripheral polyneuropathy diagnoses.  She is complaining about neck pain as well and she can either follow-up with Dr. Marlou Sa or in a couple of weeks depending on how she is doing after this injection we could entertain the idea of follow-up evaluation for her neck.     Referring:Dr. Landry Dyke Dean    ROS Otherwise per HPI.  Assessment & Plan: Visit Diagnoses:    ICD-10-CM   1. Spondylosis without myelopathy or radiculopathy, lumbar region  M47.816 XR C-ARM NO REPORT    Facet Injection    betamethasone acetate-betamethasone sodium phosphate (CELESTONE) injection 12 mg    Plan: No additional findings.   Meds & Orders:  Meds ordered this encounter  Medications  . betamethasone acetate-betamethasone sodium phosphate (CELESTONE) injection 12 mg    Orders Placed This Encounter  Procedures  . Facet Injection  . XR C-ARM NO REPORT    Follow-up: Return if symptoms worsen or fail to improve.   Procedures: No procedures performed  Lumbar Facet Joint Intra-Articular Injection(s)  with Fluoroscopic Guidance  Patient: Priscilla [name]      Date of Birth: 21-Dec-1950 MRN: [id number] PCP: Ma Hillock, DO      Visit Date: 09/13/2020   Universal Protocol:    Date/Time: 09/13/2020  Consent Given By: the patient  Position: PRONE   Additional Comments: Vital signs were monitored before and after the procedure. Patient was prepped and draped in the usual sterile fashion. The correct patient, procedure, and site was verified.   Injection Procedure Details:  Procedure Site One Meds Administered:  Meds ordered this encounter  Medications  . betamethasone acetate-betamethasone sodium phosphate (CELESTONE) injection 12 mg     Laterality: Bilateral  Location/Site:  L4-L5  Needle size: 22 guage  Needle type: Spinal  Needle Placement: Articular  Findings:  -Comments: Excellent flow of contrast producing a partial arthrogram.  Procedure Details: The fluoroscope beam is vertically oriented in AP, and the inferior recess is visualized beneath the lower pole of the inferior apophyseal process, which represents the target point for needle insertion. When direct visualization is difficult the target point is located at the medial projection of the vertebral pedicle. The region overlying each aforementioned target is locally anesthetized with a 1 to 2 ml. volume of 1% Lidocaine without Epinephrine.   The spinal needle was inserted into each of the above mentioned facet joints using biplanar fluoroscopic guidance. A 0.25 to 0.5 ml. volume of Isovue-250 was injected and a partial facet joint arthrogram was obtained. A single spot film was obtained of the resulting arthrogram.    One to 1.25 ml  of the steroid/anesthetic solution was then injected into each of the facet joints noted above.   Additional Comments:  The patient tolerated the procedure well Dressing: 2 x 2 sterile gauze and Band-Aid    Post-procedure details: Patient was observed during the  procedure. Post-procedure instructions were reviewed.  Patient left the clinic in stable condition.      Clinical History: MR OF THE RIGHT HIP WITHOUT CONTRAST  TECHNIQUE: Multiplanar, multisequence MR imaging was performed. No intravenous contrast was administered.  COMPARISON:  Plain films right hip 01/23/2016.  FINDINGS: Bones: No fracture, stress change or worrisome lesion is identified. There is no subchondral edema or cyst formation about either hip. No avascular necrosis of the femoral heads. Marrow signal is diffusely heterogeneous with an appearance most suggestive reactivation as can be seen in obesity.  Articular cartilage and labrum  Articular cartilage:  Thinned and degenerated without focal defect.  Labrum:  Intact.  Joint or bursal effusion  Joint effusion:  Negative.  Bursae: There is a small volume of fluid in the right trochanteric bursa.  Muscles and tendons  Muscles and tendons: Intact. Mild gluteus medius and right hamstring tendinosis noted.  Other findings  Miscellaneous: Imaged intrapelvic contents demonstrate postoperative change of hysterectomy. No acute or focal abnormality is identified.  IMPRESSION: Mild appearing right hip osteoarthritis.  Small volume of fluid in the right trochanteric bursa compatible with bursitis.  Mild appearing right gluteus medius and hamstring tendinosis without tear.   Electronically Signed   By: Inge Rise M.D.   On: 01/14/2020 10:43 ------     L3-L4: Eccentric left mild broad-based disc bulge. Moderate  bilateral facet arthropathy. Mild spinal stenosis. No evidence of  neural foraminal stenosis.    L4-L5: Moderate broad-based disc bulge with a posterior annular  fissure. Severe bilateral facet arthropathy with ligamentum flavum  infolding resulting in severe spinal stenosis and bilateral lateral  recess stenosis. No evidence of neural foraminal stenosis.     L5-S1: Mild broad-based disc bulge. Moderate bilateral facet  arthropathy. No evidence of neural foraminal stenosis. No central  canal stenosis.    IMPRESSION:  1. Lumbar spine spondylosis most severe at L4-5.  2. At L4-5 there is a moderate broad-based disc bulge with a  posterior annular fissure. Severe bilateral facet arthropathy with  ligamentum flavum infolding resulting in severe spinal stenosis and  bilateral lateral recess stenosis.      Electronically Signed  By: Kathreen Devoid  On: 10/31/2014 16:53     Objective:  VS:  HT:    WT:   BMI:     BP:114/75  HR:(!) 57bpm  TEMP: ( )  RESP:  Physical Exam Vitals and nursing note reviewed.  Constitutional:      General: She is not in acute distress.    Appearance: Normal appearance. She is not ill-appearing.  HENT:     Head: Normocephalic and atraumatic.     Right Ear: External ear normal.     Left Ear: External ear normal.  Eyes:     Extraocular Movements: Extraocular movements intact.  Cardiovascular:     Rate and Rhythm: Normal rate.     Pulses: Normal pulses.  Pulmonary:     Effort: Pulmonary effort is normal. No respiratory distress.  Abdominal:     General: There is no distension.     Palpations: Abdomen is soft.  Musculoskeletal:        General: Tenderness present.     Cervical back: Neck supple.  Right lower leg: No edema.     Left lower leg: No edema.     Comments: Patient has good distal strength with no pain over the greater trochanters.  No clonus or focal weakness. Patient somewhat slow to rise from a seated position to full extension.  There is concordant low back pain with facet loading and lumbar spine extension rotation.  There are no definitive trigger points but the patient is somewhat tender across the lower back and PSIS.  There is no pain with hip rotation.   Skin:    Findings: No erythema, lesion or rash.  Neurological:     General: No focal deficit present.     Mental Status:  She is alert and oriented to person, place, and time.     Sensory: No sensory deficit.     Motor: No weakness or abnormal muscle tone.     Coordination: Coordination normal.  Psychiatric:        Mood and Affect: Mood normal.        Behavior: Behavior normal.      Imaging: XR C-ARM NO REPORT  Result Date: 09/13/2020 Please see Notes tab for imaging impression.

## 2020-09-13 NOTE — Patient Instructions (Signed)

## 2020-09-13 NOTE — Progress Notes (Signed)
Pt state Lower back pain that travels to her buttock and round to her right groin. Pt state getting up from the floor, walking and standing makes the pain worse. Pt state she take over the count pain med and use heating to help ease her pain.  Numeric Pain Rating Scale and Functional Assessment Average Pain 4   In the last MONTH (on 0-10 scale) has pain interfered with the following?  1. General activity like being  able to carry out your everyday physical activities such as walking, climbing stairs, carrying groceries, or moving a chair?  Rating(6)   +Driver, -BT, -Dye Allergies.

## 2020-09-13 NOTE — Procedures (Signed)
Lumbar Facet Joint Intra-Articular Injection(s) with Fluoroscopic Guidance  Patient: Priscilla [name]      Date of Birth: December 31, 1950 MRN: [id number] PCP: Ma Hillock, DO      Visit Date: 09/13/2020   Universal Protocol:    Date/Time: 09/13/2020  Consent Given By: the patient  Position: PRONE   Additional Comments: Vital signs were monitored before and after the procedure. Patient was prepped and draped in the usual sterile fashion. The correct patient, procedure, and site was verified.   Injection Procedure Details:  Procedure Site One Meds Administered:  Meds ordered this encounter  Medications  . betamethasone acetate-betamethasone sodium phosphate (CELESTONE) injection 12 mg     Laterality: Bilateral  Location/Site:  L4-L5  Needle size: 22 guage  Needle type: Spinal  Needle Placement: Articular  Findings:  -Comments: Excellent flow of contrast producing a partial arthrogram.  Procedure Details: The fluoroscope beam is vertically oriented in AP, and the inferior recess is visualized beneath the lower pole of the inferior apophyseal process, which represents the target point for needle insertion. When direct visualization is difficult the target point is located at the medial projection of the vertebral pedicle. The region overlying each aforementioned target is locally anesthetized with a 1 to 2 ml. volume of 1% Lidocaine without Epinephrine.   The spinal needle was inserted into each of the above mentioned facet joints using biplanar fluoroscopic guidance. A 0.25 to 0.5 ml. volume of Isovue-250 was injected and a partial facet joint arthrogram was obtained. A single spot film was obtained of the resulting arthrogram.    One to 1.25 ml of the steroid/anesthetic solution was then injected into each of the facet joints noted above.   Additional Comments:  The patient tolerated the procedure well Dressing: 2 x 2 sterile gauze and Band-Aid    Post-procedure  details: Patient was observed during the procedure. Post-procedure instructions were reviewed.  Patient left the clinic in stable condition.

## 2020-09-14 ENCOUNTER — Encounter: Payer: Self-pay | Admitting: Family Medicine

## 2020-09-23 ENCOUNTER — Other Ambulatory Visit: Payer: Self-pay

## 2020-09-23 ENCOUNTER — Ambulatory Visit: Payer: Self-pay

## 2020-09-23 ENCOUNTER — Encounter: Payer: Self-pay | Admitting: Rheumatology

## 2020-09-23 ENCOUNTER — Ambulatory Visit (INDEPENDENT_AMBULATORY_CARE_PROVIDER_SITE_OTHER): Payer: Medicare Other | Admitting: Rheumatology

## 2020-09-23 VITALS — BP 124/78 | HR 66 | Resp 14 | Ht 64.0 in | Wt 161.0 lb

## 2020-09-23 DIAGNOSIS — M51369 Other intervertebral disc degeneration, lumbar region without mention of lumbar back pain or lower extremity pain: Secondary | ICD-10-CM

## 2020-09-23 DIAGNOSIS — M19042 Primary osteoarthritis, left hand: Secondary | ICD-10-CM

## 2020-09-23 DIAGNOSIS — Z862 Personal history of diseases of the blood and blood-forming organs and certain disorders involving the immune mechanism: Secondary | ICD-10-CM

## 2020-09-23 DIAGNOSIS — Z9884 Bariatric surgery status: Secondary | ICD-10-CM

## 2020-09-23 DIAGNOSIS — F32A Depression, unspecified: Secondary | ICD-10-CM

## 2020-09-23 DIAGNOSIS — M79672 Pain in left foot: Secondary | ICD-10-CM

## 2020-09-23 DIAGNOSIS — Z8739 Personal history of other diseases of the musculoskeletal system and connective tissue: Secondary | ICD-10-CM | POA: Diagnosis not present

## 2020-09-23 DIAGNOSIS — M79671 Pain in right foot: Secondary | ICD-10-CM

## 2020-09-23 DIAGNOSIS — M19041 Primary osteoarthritis, right hand: Secondary | ICD-10-CM | POA: Diagnosis not present

## 2020-09-23 DIAGNOSIS — M25462 Effusion, left knee: Secondary | ICD-10-CM

## 2020-09-23 DIAGNOSIS — G4709 Other insomnia: Secondary | ICD-10-CM | POA: Diagnosis not present

## 2020-09-23 DIAGNOSIS — Z8659 Personal history of other mental and behavioral disorders: Secondary | ICD-10-CM

## 2020-09-23 DIAGNOSIS — Z8639 Personal history of other endocrine, nutritional and metabolic disease: Secondary | ICD-10-CM

## 2020-09-23 DIAGNOSIS — L409 Psoriasis, unspecified: Secondary | ICD-10-CM | POA: Diagnosis not present

## 2020-09-23 DIAGNOSIS — M797 Fibromyalgia: Secondary | ICD-10-CM

## 2020-09-23 DIAGNOSIS — Z8709 Personal history of other diseases of the respiratory system: Secondary | ICD-10-CM

## 2020-09-23 DIAGNOSIS — M47816 Spondylosis without myelopathy or radiculopathy, lumbar region: Secondary | ICD-10-CM

## 2020-09-23 DIAGNOSIS — M5030 Other cervical disc degeneration, unspecified cervical region: Secondary | ICD-10-CM | POA: Diagnosis not present

## 2020-09-23 DIAGNOSIS — M5136 Other intervertebral disc degeneration, lumbar region: Secondary | ICD-10-CM | POA: Diagnosis not present

## 2020-09-23 DIAGNOSIS — L4050 Arthropathic psoriasis, unspecified: Secondary | ICD-10-CM | POA: Diagnosis not present

## 2020-09-23 DIAGNOSIS — Z8669 Personal history of other diseases of the nervous system and sense organs: Secondary | ICD-10-CM

## 2020-09-23 DIAGNOSIS — F419 Anxiety disorder, unspecified: Secondary | ICD-10-CM

## 2020-09-23 DIAGNOSIS — R5383 Other fatigue: Secondary | ICD-10-CM

## 2020-09-23 DIAGNOSIS — M7061 Trochanteric bursitis, right hip: Secondary | ICD-10-CM

## 2020-09-23 MED ORDER — LIDOCAINE HCL 1 % IJ SOLN
3.0000 mL | INTRAMUSCULAR | Status: AC | PRN
Start: 2020-09-23 — End: 2020-09-23
  Administered 2020-09-23: 3 mL

## 2020-09-23 MED ORDER — TRIAMCINOLONE ACETONIDE 40 MG/ML IJ SUSP
40.0000 mg | INTRAMUSCULAR | Status: AC | PRN
  Administered 2020-09-23: 40 mg via INTRA_ARTICULAR

## 2020-09-23 NOTE — Patient Instructions (Signed)
Cervical Strain and Sprain Rehab ?Ask your health care provider which exercises are safe for you. Do exercises exactly as told by your health care provider and adjust them as directed. It is normal to feel mild stretching, pulling, tightness, or discomfort as you do these exercises. Stop right away if you feel sudden pain or your pain gets worse. Do not begin these exercises until told by your health care provider. ?Stretching and range-of-motion exercises ?Cervical side bending ? ?Using good posture, sit on a stable chair or stand up. ?Without moving your shoulders, slowly tilt your left / right ear to your shoulder until you feel a stretch in the opposite side neck muscles. You should be looking straight ahead. ?Hold for __________ seconds. ?Repeat with the other side of your neck. ?Repeat __________ times. Complete this exercise __________ times a day. ?Cervical rotation ? ?Using good posture, sit on a stable chair or stand up. ?Slowly turn your head to the side as if you are looking over your left / right shoulder. ?Keep your eyes level with the ground. ?Stop when you feel a stretch along the side and the back of your neck. ?Hold for __________ seconds. ?Repeat this by turning to your other side. ?Repeat __________ times. Complete this exercise __________ times a day. ?Thoracic extension and pectoral stretch ?Roll a towel or a small blanket so it is about 4 inches (10 cm) in diameter. ?Lie down on your back on a firm surface. ?Put the towel lengthwise, under your spine in the middle of your back. It should not be under your shoulder blades. The towel should line up with your spine from your middle back to your lower back. ?Put your hands behind your head and let your elbows fall out to your sides. ?Hold for __________ seconds. ?Repeat __________ times. Complete this exercise __________ times a day. ?Strengthening exercises ?Isometric upper cervical flexion ?Lie on your back with a thin pillow behind your head  and a small rolled-up towel under your neck. ?Gently tuck your chin toward your chest and nod your head down to look toward your feet. Do not lift your head off the pillow. ?Hold for __________ seconds. ?Release the tension slowly. Relax your neck muscles completely before you repeat this exercise. ?Repeat __________ times. Complete this exercise __________ times a day. ?Isometric cervical extension ? ?Stand about 6 inches (15 cm) away from a wall, with your back facing the wall. ?Place a soft object, about 6-8 inches (15-20 cm) in diameter, between the back of your head and the wall. A soft object could be a small pillow, a ball, or a folded towel. ?Gently tilt your head back and press into the soft object. Keep your jaw and forehead relaxed. ?Hold for __________ seconds. ?Release the tension slowly. Relax your neck muscles completely before you repeat this exercise. ?Repeat __________ times. Complete this exercise __________ times a day. ?Posture and body mechanics ?Body mechanics refers to the movements and positions of your body while you do your daily activities. Posture is part of body mechanics. Good posture and healthy body mechanics can help to relieve stress in your body's tissues and joints. Good posture means that your spine is in its natural S-curve position (your spine is neutral), your shoulders are pulled back slightly, and your head is not tipped forward. The following are general guidelines for applying improved posture and body mechanics to your everyday activities. ?Sitting ? ?When sitting, keep your spine neutral and keep your feet flat on the floor.   Use a footrest, if necessary, and keep your thighs parallel to the floor. Avoid rounding your shoulders, and avoid tilting your head forward. ?When working at a desk or a computer, keep your desk at a height where your hands are slightly lower than your elbows. Slide your chair under your desk so you are close enough to maintain good posture. ?When  working at a computer, place your monitor at a height where you are looking straight ahead and you do not have to tilt your head forward or downward to look at the screen. ?Standing ? ?When standing, keep your spine neutral and keep your feet about hip-width apart. Keep a slight bend in your knees. Your ears, shoulders, and hips should line up. ?When you do a task in which you stand in one place for a long time, place one foot up on a stable object that is 2-4 inches (5-10 cm) high, such as a footstool. This helps keep your spine neutral. ?Resting ?When lying down and resting, avoid positions that are most painful for you. Try to support your neck in a neutral position. You can use a contour pillow or a small rolled-up towel. Your pillow should support your neck but not push on it. ?This information is not intended to replace advice given to you by your health care provider. Make sure you discuss any questions you have with your health care provider. ?Document Revised: 07/17/2018 Document Reviewed: 12/26/2017 ?Elsevier Patient Education ? 2022 [employer] ? ?

## 2020-09-23 NOTE — Progress Notes (Signed)
Synovial fluid is not inflammatory, most likely due to osteoarthritis.  Crystals are negative for gout or pseudogout.

## 2020-09-27 NOTE — Progress Notes (Signed)
Synovial fluid culture is negative.

## 2020-09-28 LAB — ANAEROBIC AND AEROBIC CULTURE
AER RESULT:: NO GROWTH
MICRO NUMBER:: 12015769
MICRO NUMBER:: 12015770
SPECIMEN QUALITY:: ADEQUATE
SPECIMEN QUALITY:: ADEQUATE

## 2020-09-28 LAB — SYNOVIAL FLUID ANALYSIS, COMPLETE
Basophils, %: 0 %
Eosinophils-Synovial: 0 % (ref 0–2)
Lymphocytes-Synovial Fld: 32 % (ref 0–74)
Monocyte/Macrophage: 63 % (ref 0–69)
Neutrophil, Synovial: 5 % (ref 0–24)
Synoviocytes, %: 0 % (ref 0–15)
WBC, Synovial: 465 cells/uL — ABNORMAL HIGH (ref <150)

## 2020-10-01 ENCOUNTER — Ambulatory Visit: Payer: Self-pay | Admitting: *Deleted

## 2020-10-03 ENCOUNTER — Other Ambulatory Visit: Payer: Self-pay | Admitting: Physician Assistant

## 2020-10-04 NOTE — Telephone Encounter (Signed)
Next Visit: 11/30/2020  Last Visit: 09/23/2020  Last Fill: 09/02/2020  Dx: Other insomnia  Current Dose per office note on 09/23/2020: Ambien 5 mg 1 tablet by mouth at bedtime.  Okay to refill Ambien?

## 2020-10-05 ENCOUNTER — Ambulatory Visit: Payer: Medicare Other | Admitting: Internal Medicine

## 2020-10-07 ENCOUNTER — Encounter: Payer: Self-pay | Admitting: Rheumatology

## 2020-10-07 ENCOUNTER — Encounter: Payer: Self-pay | Admitting: Physical Medicine and Rehabilitation

## 2020-10-07 DIAGNOSIS — M25562 Pain in left knee: Secondary | ICD-10-CM

## 2020-10-07 NOTE — Telephone Encounter (Signed)
I could not reach the patient.  The synovial fluid aspirated from her knee was not inflammatory.  I would recommend scheduling MRI of the knee joint to rule out internal derangement.

## 2020-10-08 ENCOUNTER — Telehealth: Payer: Self-pay | Admitting: Physical Medicine and Rehabilitation

## 2020-10-08 NOTE — Telephone Encounter (Signed)
Called patient to advise per 6/30 MyChart message. I discussed RFA, but patient states that her back is doing well. I advised that Dr. Ernestina Patches is out of the office next week and offered to schedule an appointment with another provider in the office to evaluate her hip. She states that she will wait for Dr. Ernestina Patches. Scheduled for OV on 7/19.

## 2020-10-15 ENCOUNTER — Other Ambulatory Visit: Payer: Self-pay

## 2020-10-15 ENCOUNTER — Ambulatory Visit (INDEPENDENT_AMBULATORY_CARE_PROVIDER_SITE_OTHER): Payer: Medicare Other | Admitting: Family Medicine

## 2020-10-15 ENCOUNTER — Encounter: Payer: Self-pay | Admitting: Family Medicine

## 2020-10-15 VITALS — BP 95/60 | HR 68 | Temp 98.6°F | Resp 16 | Ht 64.0 in | Wt 159.6 lb

## 2020-10-15 DIAGNOSIS — H532 Diplopia: Secondary | ICD-10-CM | POA: Diagnosis not present

## 2020-10-15 DIAGNOSIS — Z794 Long term (current) use of insulin: Secondary | ICD-10-CM | POA: Diagnosis not present

## 2020-10-15 DIAGNOSIS — H524 Presbyopia: Secondary | ICD-10-CM | POA: Diagnosis not present

## 2020-10-15 DIAGNOSIS — E785 Hyperlipidemia, unspecified: Secondary | ICD-10-CM | POA: Diagnosis not present

## 2020-10-15 DIAGNOSIS — H02055 Trichiasis without entropian left lower eyelid: Secondary | ICD-10-CM | POA: Diagnosis not present

## 2020-10-15 DIAGNOSIS — E1142 Type 2 diabetes mellitus with diabetic polyneuropathy: Secondary | ICD-10-CM | POA: Diagnosis not present

## 2020-10-15 DIAGNOSIS — E2839 Other primary ovarian failure: Secondary | ICD-10-CM | POA: Diagnosis not present

## 2020-10-15 DIAGNOSIS — F32A Depression, unspecified: Secondary | ICD-10-CM | POA: Diagnosis not present

## 2020-10-15 DIAGNOSIS — H2513 Age-related nuclear cataract, bilateral: Secondary | ICD-10-CM | POA: Diagnosis not present

## 2020-10-15 DIAGNOSIS — G4709 Other insomnia: Secondary | ICD-10-CM | POA: Diagnosis not present

## 2020-10-15 DIAGNOSIS — E11319 Type 2 diabetes mellitus with unspecified diabetic retinopathy without macular edema: Secondary | ICD-10-CM

## 2020-10-15 DIAGNOSIS — F419 Anxiety disorder, unspecified: Secondary | ICD-10-CM

## 2020-10-15 DIAGNOSIS — H40022 Open angle with borderline findings, high risk, left eye: Secondary | ICD-10-CM | POA: Diagnosis not present

## 2020-10-15 DIAGNOSIS — I10 Essential (primary) hypertension: Secondary | ICD-10-CM

## 2020-10-15 DIAGNOSIS — H5203 Hypermetropia, bilateral: Secondary | ICD-10-CM | POA: Diagnosis not present

## 2020-10-15 DIAGNOSIS — E113293 Type 2 diabetes mellitus with mild nonproliferative diabetic retinopathy without macular edema, bilateral: Secondary | ICD-10-CM | POA: Diagnosis not present

## 2020-10-15 DIAGNOSIS — H52223 Regular astigmatism, bilateral: Secondary | ICD-10-CM | POA: Diagnosis not present

## 2020-10-15 DIAGNOSIS — H5051 Esophoria: Secondary | ICD-10-CM | POA: Diagnosis not present

## 2020-10-15 LAB — HM DIABETES EYE EXAM

## 2020-10-15 MED ORDER — METOPROLOL TARTRATE 25 MG PO TABS
ORAL_TABLET | ORAL | 5 refills | Status: DC
Start: 2020-10-15 — End: 2021-05-06

## 2020-10-15 MED ORDER — SERTRALINE HCL 100 MG PO TABS: 100.0000 mg | ORAL_TABLET | Freq: Every day | ORAL | 5 refills | Status: DC

## 2020-10-15 MED ORDER — PRAVASTATIN SODIUM 40 MG PO TABS: 40.0000 mg | ORAL_TABLET | Freq: Every day | ORAL | 11 refills | Status: DC

## 2020-10-15 MED ORDER — DULOXETINE HCL 60 MG PO CPEP: 60.0000 mg | ORAL_CAPSULE | Freq: Every day | ORAL | 5 refills | Status: DC

## 2020-10-15 NOTE — Progress Notes (Signed)
Patient Care Team    Relationship Specialty Notifications Start End  Ma Hillock, DO PCP - General Family Medicine  07/19/15    Comment: transfer to [place], Tonna Corner, MD Consulting Physician Orthopedic Surgery  08/06/15   Philemon Kingdom, MD Consulting Physician Internal Medicine  08/06/15    Comment: endocrine  Mcarthur Rossetti, MD Consulting Physician Orthopedic Surgery  08/06/15   Bo Merino, MD Consulting Physician Rheumatology  08/06/15   Volanda Napoleon, MD Consulting Physician Oncology  11/10/16   Magnus Sinning, MD Consulting Physician Physical Medicine and Rehabilitation  11/10/16   Dermatology, [facility]    11/10/16    Comment: Dr. [place]  Hyatt, Romilda Garret, [place] Consulting Physician Podiatry  12/03/18      SUBJECTIVE Chief Complaint  Patient presents with   [facility]    Pt is not fasting   Hypertension    HPI: Priscilla [name] is a 70 y.o. female present for cmc Hypertension/hyperlipidemia: Pt reports compliance with metoprolol 25 mg BID and lisinopril 2.5 mg daily. Blood pressures ranges at home are not checked. Patient denies chest pain, shortness of breath, or lower extremity edema.  She has been dizzy on occasions.  Last week she got dizzy while working in the garden and fell. She reports compliance with baby aspirin and pravastatin. Labs UTD Diet: low sodium.  Exercise: routinely.  RF: HTN, HLD, DM, FHx HD   Peripheral neuropathy/anxiety: Patient reports compliance with  Cymbalta 60 mg a day and zoloft 100 mg which was increased last visit.  She reports the Cymbalta has been  helpful with her neuropathy.  She feels this regimen is working very well for her.   ROS: See pertinent positives and negatives per HPI.  Depression screen [facility] 2/9 10/15/2020 01/14/2020 09/23/2019 03/14/2019 12/03/2018  Decreased Interest 0 0 0 0 0  Down, Depressed, Hopeless 2 3 3  0 0  PHQ - 2 Score 2 3 3  0 0  Altered sleeping 2 0 3 0 -  Tired, decreased energy 2 1 3 1  -   Change in appetite 0 0 0 0 -  Feeling bad or failure about yourself  0 0 0 0 -  Trouble concentrating 1 1 0 0 -  Moving slowly or fidgety/restless 1 0 0 0 -  Suicidal thoughts 0 0 0 0 -  PHQ-9 Score 8 5 9 1  -  Difficult doing work/chores - Somewhat difficult Somewhat difficult Not difficult at all -  Some recent data might be hidden   GAD 7 : Generalized Anxiety Score 10/15/2020 09/23/2019 03/14/2019 08/13/2018  Nervous, Anxious, on Edge 2 3 0 2  Control/stop worrying 2 3 0 0  Worry too much - different things 2 3 0 3  Trouble relaxing 0 0 0 0  Restless 0 0 0 0  Easily annoyed or irritable 1 0 0 0  Afraid - awful might happen 0 0 0 0  Total GAD 7 Score 7 9 0 5  Anxiety Difficulty - Not difficult at all Not difficult at all Not difficult at all    Patient Active Problem List   Diagnosis Date Noted   Decreased circulation right great toe 05/28/2020   Hip pain 11/27/2019   Type 2 diabetes mellitus with retinopathy, with long-term current use of insulin ([place]) 05/17/2018   Overweight (BMI 25.0-29.9) 02/11/2018   Vitamin D deficiency 10/13/2016   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   Gastric bypass status for obesity 08/31/2016   Iron deficiency anemia 04/28/2016  Other insomnia 03/14/2016   DDD (degenerative disc disease), cervical 03/14/2016   Spondylosis without myelopathy or radiculopathy, lumbar region 01/31/2016   Anxiety and depression 06/25/2014   Psoriatic arthritis - sees rheumatologist 02/20/2014   Asthma 12/14/2008   Diabetic peripheral neuropathy associated with type 2 diabetes mellitus ([place]) 08/06/2008   Hyperlipemia 11/12/2006   Essential hypertension 11/12/2006    Social History   Tobacco Use   Smoking status: Never   Smokeless tobacco: Never  Substance Use Topics   Alcohol use: Yes    Comment: glass of wine-special occasion    Current Outpatient Medications:    albuterol (PROVENTIL HFA;VENTOLIN HFA) 108 (90 Base) MCG/ACT inhaler, Inhale 2 puffs into the lungs  every 6 (six) hours as needed for wheezing or shortness of breath., Disp: 1 Inhaler, Rfl: 1   aspirin 81 MG tablet, Take 81 mg by mouth daily., Disp: , Rfl:    Biotin 5000 MCG TABS, Take 5,000 mcg by mouth daily. , Disp: , Rfl:    calcium-vitamin D 250-100 MG-UNIT per tablet, Take 1 tablet by mouth daily. , Disp: , Rfl:    COLLAGEN PO, Take by mouth. Adds to tea daily, Disp: , Rfl:    Continuous Blood Gluc Sensor (FREESTYLE LIBRE 2 SENSOR) MISC, Use sensors with CGM to check sugars., Disp: 6 each, Rfl: 3   Cyanocobalamin (VITAMIN B12 PO), Place under the tongue., Disp: , Rfl:    glucose blood (ONETOUCH VERIO) test strip, USE TO TEST BLOOD SUGAR TWICE A DAY DX: E11.319, Disp: 150 each, Rfl: 3   insulin detemir (LEVEMIR) 100 UNIT/ML injection, Inject 0.13-0.15 mLs (13-15 Units total) into the skin at bedtime., Disp: 30 mL, Rfl: 3   Insulin Syringe-Needle U-100 (B-D INS SYR MICROFINE 1CC/27G) 27G X 5/8" 1 ML MISC, Use to inject levemir once daily DX: E11.319, Disp: 100 each, Rfl: 3   metFORMIN (GLUCOPHAGE) 1000 MG tablet, TAKE 1 TABLET BY MOUTH TWICE DAILY WITH MEALS, Disp: 180 tablet, Rfl: 3   Multiple Vitamin (MULTIVITAMIN) tablet, Take 1 tablet by mouth daily. , Disp: , Rfl:    Multiple Vitamins-Minerals (ZINC PO), Take by mouth daily., Disp: , Rfl:    NOVOLOG FLEXPEN 100 UNIT/ML FlexPen, Inject 2-4 Units into the skin 3 (three) times daily with meals., Disp: 15 mL, Rfl: 2   Vitamin D, Ergocalciferol, (DRISDOL) 1.25 MG (50000 UNIT) CAPS capsule, TAKE 1 CAPSULE BY MOUTH EVERY 7 DAYS, Disp: 4 capsule, Rfl: 6   zolpidem (AMBIEN) 5 MG tablet, TAKE 1 TABLET(5 MG) BY MOUTH AT BEDTIME AS NEEDED FOR SLEEP, Disp: 30 tablet, Rfl: 0   DULoxetine (CYMBALTA) 60 MG capsule, Take 1 capsule (60 mg total) by mouth daily., Disp: 30 capsule, Rfl: 5   metoprolol tartrate (LOPRESSOR) 25 MG tablet, TAKE 1 TABLET(25 MG) BY MOUTH TWICE DAILY, Disp: 60 tablet, Rfl: 5   pravastatin (PRAVACHOL) 40 MG tablet, Take 1 tablet  (40 mg total) by mouth daily., Disp: 30 tablet, Rfl: 11   sertraline (ZOLOFT) 100 MG tablet, Take 1 tablet (100 mg total) by mouth daily., Disp: 30 tablet, Rfl: 5  No Known Allergies  OBJECTIVE: BP 95/60   Pulse 68   Temp 98.6 F (37 C) (Oral)   Resp 16   Ht 5\' 4"  (1.626 m)   Wt 159 lb 9.6 oz (72.4 kg)   LMP  (LMP Unknown)   SpO2 98%   BMI 27.40 kg/m  Gen: Afebrile. No acute distress.  Nontoxic, very pleasant mildly overweight female. HENT: AT. [place].  No cough.  No hoarseness. Eyes:Pupils Equal Round Reactive to light, Extraocular movements intact,  Conjunctiva without redness, discharge or icterus. Neck/lymp/endocrine: Supple, no lymphadenopathy, no thyromegaly CV: RRR no murmur, no edema, +2/4 P posterior tibialis pulses Chest: CTAB, no wheeze or crackles Abd: Soft. NTND. BS present.   Neuro:  Normal gait. PERLA. EOMi. Alert. Oriented x3 Psych: Normal affect, dress and demeanor. Normal speech. Normal thought content and judgment Diabetic Foot Exam - Simple   Simple Foot Form Diabetic Foot exam was performed with the following findings: Yes 10/15/2020  2:58 PM  Visual Inspection No deformities, no ulcerations, no other skin breakdown bilaterally: Yes Sensation Testing Intact to touch and monofilament testing bilaterally: Yes Pulse Check Posterior Tibialis and Dorsalis pulse intact bilaterally: Yes Comments      ASSESSMENT AND PLAN: Priscilla [name] is a 70 y.o. female present for  Essential hypertension/palpitations/HLD/overweight -Low blood pressures with reported dizziness. -Continue metoprolol 25 mg daily, if blood pressure still less than 643 systolic after discontinuing lisinopril then she is instructed to take a half a tab twice daily of her metoprolol. -Discontinue lisinopril 2.5 mg daily -Continue pravastatin Hydrate - low sodium. Routine exercise.  - con't baby ASA - f/u 5.5 months.   Vitamin D deficiency: Vitamin D levels UTD.  Continue supplementation    Anxiety and depression/neuropathy Greatly improved and stable Continue Cymbalta 60 mg daily Continue Zoloft 100 mg.   -Follow-up 5.5 months-sooner if depression anxiety is not adequately controlled.   type 2 diabetes mellitus with retinopathy, with long-term current use of insulin ([place]) Managed by endocrinology.  A1c is at goal.   Orders Placed This Encounter  Procedures   DG Bone Density    Meds ordered this encounter  Medications   DULoxetine (CYMBALTA) 60 MG capsule    Sig: Take 1 capsule (60 mg total) by mouth daily.    Dispense:  30 capsule    Refill:  5   metoprolol tartrate (LOPRESSOR) 25 MG tablet    Sig: TAKE 1 TABLET(25 MG) BY MOUTH TWICE DAILY    Dispense:  60 tablet    Refill:  5   pravastatin (PRAVACHOL) 40 MG tablet    Sig: Take 1 tablet (40 mg total) by mouth daily.    Dispense:  30 tablet    Refill:  11   sertraline (ZOLOFT) 100 MG tablet    Sig: Take 1 tablet (100 mg total) by mouth daily.    Dispense:  30 tablet    Refill:  5    Referral Orders  No referral(s) requested today      Howard Pouch, DO 10/15/2020

## 2020-10-15 NOTE — Patient Instructions (Signed)
Stop the lisinopril. Monitor Blood pressure- if still below 110 on the top, take 1/2 tab of metoprolol twice a day (instead of whole tab)  Follow up in 5.5 months.

## 2020-10-21 ENCOUNTER — Telehealth: Payer: Self-pay

## 2020-10-21 NOTE — Telephone Encounter (Signed)
Inbound fax from [facility] requesting Office notes be faxed to [phone]

## 2020-10-26 ENCOUNTER — Ambulatory Visit: Payer: Self-pay

## 2020-10-26 ENCOUNTER — Other Ambulatory Visit: Payer: Self-pay

## 2020-10-26 ENCOUNTER — Encounter: Payer: Self-pay | Admitting: Physical Medicine and Rehabilitation

## 2020-10-26 ENCOUNTER — Ambulatory Visit (INDEPENDENT_AMBULATORY_CARE_PROVIDER_SITE_OTHER): Payer: Medicare Other

## 2020-10-26 ENCOUNTER — Ambulatory Visit (INDEPENDENT_AMBULATORY_CARE_PROVIDER_SITE_OTHER): Payer: Medicare Other | Admitting: Physical Medicine and Rehabilitation

## 2020-10-26 ENCOUNTER — Ambulatory Visit (HOSPITAL_COMMUNITY): Payer: Medicare Other

## 2020-10-26 VITALS — BP 112/73 | HR 65

## 2020-10-26 DIAGNOSIS — M47816 Spondylosis without myelopathy or radiculopathy, lumbar region: Secondary | ICD-10-CM

## 2020-10-26 DIAGNOSIS — W19XXXA Unspecified fall, initial encounter: Secondary | ICD-10-CM

## 2020-10-26 DIAGNOSIS — M25551 Pain in right hip: Secondary | ICD-10-CM

## 2020-10-26 DIAGNOSIS — M797 Fibromyalgia: Secondary | ICD-10-CM | POA: Diagnosis not present

## 2020-10-26 DIAGNOSIS — M48062 Spinal stenosis, lumbar region with neurogenic claudication: Secondary | ICD-10-CM | POA: Diagnosis not present

## 2020-10-26 MED ORDER — TRIAMCINOLONE ACETONIDE 40 MG/ML IJ SUSP
60.0000 mg | INTRAMUSCULAR | Status: AC | PRN
  Administered 2020-10-26: 60 mg via INTRA_ARTICULAR

## 2020-10-26 MED ORDER — BUPIVACAINE HCL 0.25 % IJ SOLN
4.0000 mL | INTRAMUSCULAR | Status: AC | PRN
  Administered 2020-10-26: 4 mL via INTRA_ARTICULAR

## 2020-10-26 NOTE — Progress Notes (Signed)
Priscilla [name] - 70 y.o. female MRN [id number]  Date of birth: [birth year]  Office Visit Note: Visit Date: 10/26/2020 PCP: Ma Hillock, DO Referred by: Ma Hillock, DO  Subjective: Chief Complaint  Patient presents with   Right Hip - Pain   HPI: Priscilla [name] is a 70 y.o. female who comes in today For evaluation and management of 2 distinct problems which is chronic worsening severe low back pain which is somewhat doing well after last facet joint block but also now with right hip and groin pain status post fall.  She reports working in the garden and having a small tumble as she refers to it at the end of June.  Since that time she has had increasing pain of the right hip with referral anteriorly to the groin.  She does get worsening with internal rotation of the hip and going from sit to stand.  The pain is not constant.  Her pain increases throughout the day with activity and is better at rest and lying down.  Not much pain in the morning.  No paresthesias radicular pain down the legs.  No focal weakness.  Her case is quite complicated although she does fairly well from an activity basis.  The complications are basically type 2 diabetes with history of polyneuropathy as well as history of psoriatic arthritis and fibromyalgia.  She also has pretty severe multifactorial lumbar stenosis at L4-5.  We have actually done well with a spine intervention and facet joint blocks over the years for her back pain and she is done extremely well.  We have contemplated radiofrequency ablation which she is a candidate for but it seems like the facet blocks seem to last quite a bit.  She reports her back pain is really only a 3 out of 10 at this point.  The hip pain is a constant dull and aching pain worse with movement.  She has no left-sided complaints.  In the past she has been followed from orthopedic standpoint by Dr. Anderson Malta as well as Dr. Jean Rosenthal.  Review of Systems   Musculoskeletal:  Positive for back pain and joint pain.  All other systems reviewed and are negative. Otherwise per HPI.  Assessment & Plan: Visit Diagnoses:    ICD-10-CM   1. Pain in right hip  M25.551 XR HIP UNILAT W OR W/O PELVIS 1V RIGHT    XR C-ARM NO REPORT    Large Joint Inj: R hip joint    2. Spinal stenosis of lumbar region with neurogenic claudication  M48.062     3. Fall, initial encounter  W19.XXXA     4. Spondylosis without myelopathy or radiculopathy, lumbar region  M47.816     5. Fibromyalgia  M79.7        Plan: Findings:  1.  Right hip and groin pain status post ground-level fall at the end of June.  X-rays are not concerning for fracture but do show mild to moderate degenerative change.  We actually completed MRI in the past of her hip because of continued hip pain and diagnostic relief with injection at one time.  MRI was not very impressive showing some cartilage thinning and moderate change mild to moderate changes but nothing severe.  Clinical exam is very consistent with hip pathology.  With internal rotation she gets a sharp pain in the anterior hip and groin very consistent with a hip pathology.  We did complete diagnostic hip injection today with fluoroscopic guidance  and during the anesthetic phase of the injection she had complete relief of her pain.  Depending on the longevity of this and have her follow-up with Dr. Jean Rosenthal.  2.  Low back pain axial worse with standing and going from sit to stand seems to be mostly facet mediated.  She does have severe multifactorial stenosis.  MRI is quite old at this point from 2016 but she has had no changes no radicular pain no numbness or tingling other than what she has been diagnosed with peripheral polyneuropathy.  It is complicated because she does have some pain in the legs but it does not keep her from really doing what she would like to do functionally.  Facet joint blocks that helped her lower back and  she is a candidate for radiofrequency ablation.  At some point depending on her pain with her lower back we could look at diagnostic and therapeutic epidural injection but prior to that I would probably update MRI of the lumbar spine if that was an issue.   Meds & Orders: No orders of the defined types were placed in this encounter.   Orders Placed This Encounter  Procedures   Large Joint Inj: R hip joint   XR HIP UNILAT W OR W/O PELVIS 1V RIGHT   XR C-ARM NO REPORT    Follow-up: No follow-ups on file.   Procedures: Large Joint Inj: R hip joint on 10/26/2020 9:03 AM Indications: diagnostic evaluation and pain Details: 22 G 3.5 in needle, fluoroscopy-guided anterior approach  Arthrogram: No  Medications: 4 mL bupivacaine 0.25 %; 60 mg triamcinolone acetonide 40 MG/ML Outcome: tolerated well, no immediate complications  There was excellent flow of contrast producing a partial arthrogram of the hip. The patient did have relief of symptoms during the anesthetic phase of the injection. Procedure, treatment alternatives, risks and benefits explained, specific risks discussed. Consent was given by the patient. Immediately prior to procedure a time out was called to verify the correct patient, procedure, equipment, support staff and site/side marked as required. Patient was prepped and draped in the usual sterile fashion.         Clinical History: MR OF THE RIGHT HIP WITHOUT CONTRAST   TECHNIQUE: Multiplanar, multisequence MR imaging was performed. No intravenous contrast was administered.   COMPARISON:  Plain films right hip 01/23/2016.   FINDINGS: Bones: No fracture, stress change or worrisome lesion is identified. There is no subchondral edema or cyst formation about either hip. No avascular necrosis of the femoral heads. Marrow signal is diffusely heterogeneous with an appearance most suggestive reactivation as can be seen in obesity.   Articular cartilage and labrum    Articular cartilage:  Thinned and degenerated without focal defect.   Labrum:  Intact.   Joint or bursal effusion   Joint effusion:  Negative.   Bursae: There is a small volume of fluid in the right trochanteric bursa.   Muscles and tendons   Muscles and tendons: Intact. Mild gluteus medius and right hamstring tendinosis noted.   Other findings   Miscellaneous: Imaged intrapelvic contents demonstrate postoperative change of hysterectomy. No acute or focal abnormality is identified.   IMPRESSION: Mild appearing right hip osteoarthritis.   Small volume of fluid in the right trochanteric bursa compatible with bursitis.   Mild appearing right gluteus medius and hamstring tendinosis without tear.     Electronically Signed   By: Inge Rise M.D.   On: 01/14/2020 10:43 ------     L3-L4: Eccentric left  mild broad-based disc bulge. Moderate  bilateral facet arthropathy. Mild spinal stenosis. No evidence of  neural foraminal stenosis.     L4-L5: Moderate broad-based disc bulge with a posterior annular  fissure. Severe bilateral facet arthropathy with ligamentum flavum  infolding resulting in severe spinal stenosis and bilateral lateral  recess stenosis. No evidence of neural foraminal stenosis.     L5-S1: Mild broad-based disc bulge. Moderate bilateral facet  arthropathy. No evidence of neural foraminal stenosis. No central  canal stenosis.     IMPRESSION:  1. Lumbar spine spondylosis most severe at L4-5.  2. At L4-5 there is a moderate broad-based disc bulge with a  posterior annular fissure. Severe bilateral facet arthropathy with  ligamentum flavum infolding resulting in severe spinal stenosis and  bilateral lateral recess stenosis.        Electronically Signed    By: Kathreen Devoid    On: 10/31/2014 16:53   She reports that she has never smoked. She has never used smokeless tobacco.  Recent Labs    12/30/19 1602 06/07/20 1119  HGBA1C 6.3* 6.2*     Objective:  VS:  HT:    WT:   BMI:     BP:112/73  HR:65bpm  TEMP: ( )  RESP:  Physical Exam Vitals and nursing note reviewed.  Constitutional:      General: She is not in acute distress.    Appearance: Normal appearance. She is not ill-appearing.  HENT:     Head: Normocephalic and atraumatic.     Right Ear: External ear normal.     Left Ear: External ear normal.  Eyes:     Extraocular Movements: Extraocular movements intact.  Cardiovascular:     Rate and Rhythm: Normal rate.     Pulses: Normal pulses.  Pulmonary:     Effort: Pulmonary effort is normal. No respiratory distress.  Abdominal:     General: There is no distension.     Palpations: Abdomen is soft.  Musculoskeletal:        General: Tenderness present.     Cervical back: Neck supple.     Right lower leg: No edema.     Left lower leg: No edema.     Comments: Patient has good distal strength with no pain over the greater trochanters.  No clonus or focal weakness.  She does stand with a forward flexed lumbar spine.  She does have concordant low back pain with facet loading and extension.  She does have some tender points across the lower spine and PSIS.  She has concordant exquisite pain with internal rotation on the right but it is almost like a catch type sharp sensation she actually has pretty good range of motion.  No pain on the left hip.  Skin:    Findings: No erythema, lesion or rash.  Neurological:     General: No focal deficit present.     Mental Status: She is alert and oriented to person, place, and time.     Sensory: No sensory deficit.     Motor: No weakness or abnormal muscle tone.     Coordination: Coordination normal.  Psychiatric:        Mood and Affect: Mood normal.        Behavior: Behavior normal.    Ortho Exam  Imaging: XR C-ARM NO REPORT  Result Date: 10/26/2020 Please see Notes tab for imaging impression.  XR HIP UNILAT W OR W/O PELVIS 1V RIGHT  Result Date: 10/26/2020 AP pelvis  and  right hip shows mild narrowing of the joint space compared to the left with mild to moderate degenerative change.  Small acetabular cystic structure.  No fractures or dislocations.   Past Medical/Family/Surgical/Social History: Medications & Allergies reviewed per EMR, new medications updated. 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   Gastric bypass status for obesity 08/31/2016   Iron deficiency anemia 04/28/2016   Other insomnia 03/14/2016   DDD (degenerative disc disease), cervical 03/14/2016   Spondylosis without myelopathy or radiculopathy, lumbar region 01/31/2016   Anxiety and depression 06/25/2014   Psoriatic arthritis - sees rheumatologist 02/20/2014   Asthma 12/14/2008   Diabetic peripheral neuropathy associated with type 2 diabetes mellitus ([place]) 08/06/2008   Hyperlipemia 11/12/2006   Essential hypertension 11/12/2006   Past Medical History:  Diagnosis Date   Anemia 12/14/2008   Anxiety and depression 12/14/2008   Asthma 11/12/2006   CAD (coronary artery disease) 12/14/2008   hx transluminal coronary angioplasty   Cataract    Depression    Diabetes mellitus with neuropathy ([place]) 12/14/2008   Fibromyalgia 01/04/2007   sees rheuamtology   Gastric bypass status for obesity 08/31/2016   Hyperlipemia 01/21/2010   Hypertension 12/14/2008   Leg edema 12/14/2008   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   OSA (obstructive sleep apnea) 08/06/2008   Osteoarthritis 12/14/2008   Palpitations 11/12/2006   hx sinus tachy   Psoriatic arthritis ([place])    sees rheumatology   Rotator cuff injury    Family History  Problem Relation Age of Onset   Arthritis Mother    Alzheimer's disease Mother    Stroke Father     Alcohol abuse Father    Diabetes Father    Heart disease Father    Early death Father 72   Diabetes Sister    Arthritis Sister    COPD Sister    Arthritis Maternal Aunt    Diabetes Maternal Aunt    Early death Maternal Aunt 50   Diabetes Maternal Uncle    Cancer Paternal Aunt    COPD Paternal Aunt    Heart disease Paternal Aunt    Heart disease Paternal Uncle    Alcohol abuse Paternal Grandfather    Heart disease Paternal Grandfather    Early death Paternal Grandfather 75   Stroke Paternal Grandfather    Hypertension Son    Autoimmune disease Daughter    Breast cancer Neg Hx    Past Surgical History:  Procedure Laterality Date   ABDOMINAL HYSTERECTOMY     CHOLECYSTECTOMY     LAPAROSCOPIC GASTRIC BANDING     Lumbar Facet Joint Intra-Articular Injection(s) with Fluoroscopic Guidance  09/13/2020   L4-5; Dr. Ernestina Patches   PTCA     WRIST SURGERY     LEFT   Social History   Occupational History   Occupation: retired  Tobacco Use   Smoking status: Never   Smokeless tobacco: Never  Vaping Use   Vaping Use: Never used  Substance and Sexual Activity   Alcohol use: Yes    Comment: glass of wine-special occasion   Drug use: Never   Sexual activity: Never    Birth control/protection: None

## 2020-10-26 NOTE — Progress Notes (Signed)
Hi Recent fall. Back was feeling much better. Right hip pain from groin around to back of hip. Not much pain in the mornings. Pain increases throughout the day with activity. Pain relieved by sitting or lying down. Injured left knee in fall and feels like she was putting more weight on her right leg.  Numeric Pain Rating Scale and Functional Assessment Average Pain 4 Pain Right Now 3 My pain is constant, dull, and aching Pain is worse with: walking, standing, and some activites Pain improves with: rest   In the last MONTH (on 0-10 scale) has pain interfered with the following?  1. General activity like being  able to carry out your everyday physical activities such as walking, climbing stairs, carrying groceries, or moving a chair?  Rating(7)  2. Relation with others like being able to carry out your usual social activities and roles such as  activities at home, at work and in your community. Rating(7)  3. Enjoyment of life such that you have  been bothered by emotional problems such as feeling anxious, depressed or irritable?  Rating(2)

## 2020-10-29 ENCOUNTER — Ambulatory Visit (INDEPENDENT_AMBULATORY_CARE_PROVIDER_SITE_OTHER): Payer: Medicare Other | Admitting: Internal Medicine

## 2020-10-29 ENCOUNTER — Encounter: Payer: Self-pay | Admitting: Internal Medicine

## 2020-10-29 ENCOUNTER — Other Ambulatory Visit: Payer: Self-pay

## 2020-10-29 VITALS — BP 128/88 | HR 62 | Ht 64.0 in | Wt 159.2 lb

## 2020-10-29 DIAGNOSIS — E1142 Type 2 diabetes mellitus with diabetic polyneuropathy: Secondary | ICD-10-CM | POA: Diagnosis not present

## 2020-10-29 DIAGNOSIS — E785 Hyperlipidemia, unspecified: Secondary | ICD-10-CM | POA: Diagnosis not present

## 2020-10-29 DIAGNOSIS — Z794 Long term (current) use of insulin: Secondary | ICD-10-CM

## 2020-10-29 DIAGNOSIS — E11319 Type 2 diabetes mellitus with unspecified diabetic retinopathy without macular edema: Secondary | ICD-10-CM

## 2020-10-29 LAB — POCT GLYCOSYLATED HEMOGLOBIN (HGB A1C): Hemoglobin A1C: 6.7 % — AB (ref 4.0–5.6)

## 2020-10-29 NOTE — Progress Notes (Signed)
Patient ID: Priscilla [name], female   DOB: [birth year], 70 y.o.   MRN: [id number]  This visit occurred during the SARS-CoV-2 public health emergency.  Safety protocols were in place, including screening questions prior to the visit, additional usage of staff PPE, and extensive cleaning of exam room while observing appropriate contact time as indicated for disinfecting solutions.   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 She previously saw Drs Dwyane Dee (distant past) and [place] (more recently). Last visit with me 5 months ago.  Interim history: She continues to have a lot of stress at home due to her husband having vascular dementia. He had surgery recently. No increased urination, nausea, chest pain. She had some blurry vision -saw her eye doctor. She had 2 defective sensors since last OV. She fell in her flower bed. She hurt back, hip, and knee >> had 3 steroid injections, last 4 days ago. Sugars higher >> 300s.  Reviewed HbA1c levels: Lab Results  Component Value Date   HGBA1C 6.2 (A) 06/07/2020   HGBA1C 6.3 (A) 12/30/2019   HGBA1C 6.3 (A) 08/25/2019   HGBA1C 5.9 (A) 04/24/2019   HGBA1C 6.0 (A) 02/13/2019   HGBA1C 6.1 (A) 09/20/2018   HGBA1C 6.4 (A) 05/23/2018   HGBA1C 6.0 (A) 10/17/2017   HGBA1C 6.3 06/13/2017   HGBA1C 5.9 11/24/2016   HGBA1C 6.5 (H) 04/22/2016   HGBA1C 7.4 05/18/2015   HGBA1C 7.3 (A) 12/10/2014   HGBA1C 8.0 (H) 06/05/2014   HGBA1C 7.6 (H) 09/26/2013  03/16/2017: HbA1c calculated from fructosamine: 5.6% 10/18/2015: HbA1c calculated from fructosamine: 5.7% 02/2015: HbA1c 7.4%, HbA1c calculated from fructosamine: 5.6%  She is on: - Metformin 1000 mg 2x a day with meals - Levemir 30 >> 15 units 2x a day >> 15 units >> 10 >> 18 >> 15 >> 13 units at bedtime - Mealtime Novolog: 4 to 8 units with dinner (at the start of the meal) >>  3-5 units 15-30 min before dinner >> 3 If sugars before the meal are 60 or lower, please do not take the Novolog dose. If sugars before the meal are 61-80, take only half of the Novolog dose. She was on Victoza 2.5 mg daily >> stopped as she could not afford this.  Pt.checks her sugars more than 4 times a day with her CGM:   Previously:   Previously:   Lowest sugar was42 >> 60 >> 60; she has hypoglycemia awareness in the in the 70s. Highest sugar was 285 x1 (cake) ... >> 200 >> 300 (steroids).  Glucometer: Freestyle  -No CKD; last BUN/creatinine:  Lab Results  Component Value Date   BUN 25 (H) 06/03/2020   CREATININE 0.84 06/03/2020  ACR (12/2014): 6.9 On lisinopril 1.25 mg daily  -+ HL; last set of lipids: Lab Results  Component Value Date   CHOL 202 (H) 05/11/2020   HDL 71.30 05/11/2020   LDLCALC 115 (H) 05/11/2020   TRIG 79.0 05/11/2020   CHOLHDL 3 05/11/2020  On atorvastatin 20.  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  She has floaters.  -+ numbness, tingling, burning in her feet - stable.  On B12.  I recommended alpha lipoic acid but she did not take this due to the large size of the pill.  Previously on Neurontin 100 mg daily  per podiatry, now seldom, 2/2 being also on Ambien.  Latest TSH normal: Lab Results  Component Value Date   TSH 2.93 05/11/2020   She has a history of lap band surgery >> she does not usually eat large meals but she usually grazes, especially at night  ROS: Constitutional: no weight gain/no weight loss, no fatigue, no subjective hyperthermia, no subjective hypothermia Eyes: + blurry vision, no xerophthalmia ENT: no sore throat, no nodules palpated in neck, no dysphagia, no odynophagia, no hoarseness Cardiovascular: no CP/no SOB/no palpitations/no leg swelling Respiratory: no cough/no SOB/no wheezing Gastrointestinal: no N/no V/no D/no C/no acid  reflux Musculoskeletal: no muscle aches/no joint aches Skin: no rashes, no hair loss Neurological: + tremors/+ numbness/+ tingling/no dizziness  I reviewed pt's medications, allergies, PMH, social hx, family hx, and changes were documented in the history of present illness. Otherwise, unchanged from my initial visit note.  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 Ernestina Patches   PTCA     WRIST SURGERY     LEFT   Social History   Social History   Marital Status: Married    Spouse Name: N/A   Number of Children: N/A   Occupational History   Not on file.   Social History Main Topics   Smoking status: Never Smoker    Smokeless tobacco: Never Used   Alcohol Use: Yes     Comment: glass of wine-specially occasion   Drug Use: No   Current Outpatient Medications on File Prior to Visit  Medication Sig Dispense Refill   albuterol (PROVENTIL HFA;VENTOLIN HFA) 108 (90 Base) MCG/ACT inhaler Inhale 2 puffs into the lungs every 6 (six) hours as needed for wheezing or shortness of breath. 1 Inhaler 1   aspirin 81 MG tablet Take 81 mg by mouth daily.      Biotin 5000 MCG TABS Take 5,000 mcg by mouth daily.      calcium-vitamin D 250-100 MG-UNIT per tablet Take 1 tablet by mouth daily.      COLLAGEN PO Take by mouth. Adds to tea daily     Continuous Blood Gluc Sensor (FREESTYLE LIBRE 2 SENSOR) MISC Use sensors with CGM to check sugars. 6 each 3   Cyanocobalamin (VITAMIN B12 PO) Place under the tongue.     DULoxetine (CYMBALTA) 60 MG capsule Take 1 capsule (60 mg total) by mouth daily. 30 capsule 5   glucose blood (ONETOUCH VERIO) test strip USE TO TEST BLOOD SUGAR TWICE A DAY DX: E11.319 150 each 3   insulin detemir (LEVEMIR) 100 UNIT/ML injection Inject 0.13-0.15 mLs (13-15 Units total) into the  skin at bedtime. 30 mL 3   Insulin Syringe-Needle U-100 (B-D INS SYR MICROFINE 1CC/27G) 27G X 5/8" 1 ML MISC Use to inject levemir once daily DX: E11.319 100 each 3   metFORMIN (GLUCOPHAGE) 1000 MG tablet TAKE 1 TABLET BY MOUTH TWICE DAILY WITH MEALS 180 tablet 3   metoprolol tartrate (LOPRESSOR) 25 MG tablet TAKE 1 TABLET(25 MG) BY MOUTH TWICE DAILY 60 tablet 5   Multiple Vitamin (MULTIVITAMIN) tablet Take 1 tablet by mouth daily.      Multiple Vitamins-Minerals (ZINC PO) Take by mouth daily.     NOVOLOG FLEXPEN 100 UNIT/ML FlexPen Inject 2-4 Units into the skin 3 (three) times daily with meals. 15 mL 2   pravastatin (PRAVACHOL) 40 MG tablet Take 1 tablet (40 mg total) by mouth daily. 30 tablet 11   sertraline (ZOLOFT) 100 MG tablet Take 1 tablet (100 mg total) by mouth daily. 30 tablet 5   Vitamin D, Ergocalciferol, (DRISDOL) 1.25 MG (50000 UNIT) CAPS capsule TAKE 1 CAPSULE BY MOUTH EVERY 7 DAYS 4 capsule 6   zolpidem (AMBIEN) 5 MG tablet TAKE 1 TABLET(5 MG) BY MOUTH AT BEDTIME AS NEEDED FOR SLEEP 30 tablet 0   No current facility-administered medications on file prior to visit.   No Known Allergies Family History  Problem Relation Age of Onset   Arthritis Mother    Alzheimer's disease Mother    Stroke Father    Alcohol abuse Father    Diabetes  Father    Heart disease Father    Early death Father 38   Diabetes Sister    Arthritis Sister    COPD Sister    Arthritis Maternal Aunt    Diabetes Maternal Aunt    Early death Maternal Aunt 50   Diabetes Maternal Uncle    Cancer Paternal Aunt    COPD Paternal Aunt    Heart disease Paternal Aunt    Heart disease Paternal Uncle    Alcohol abuse Paternal Grandfather    Heart disease Paternal Grandfather    Early death Paternal Grandfather 1   Stroke Paternal Grandfather    Hypertension Son    Autoimmune disease Daughter    Breast cancer Neg Hx    PE: BP 128/88 (BP Location: Right Arm, Patient Position: Sitting, Cuff Size: Normal)   Pulse 62   Ht '5\' 4"'$  (1.626 m)   Wt 159 lb 3.2 oz (72.2 kg)   LMP  (LMP Unknown)   SpO2 98%   BMI 27.33 kg/m  Body mass index is 27.33 kg/m. Wt Readings from Last 3 Encounters:  10/29/20 159 lb 3.2 oz (72.2 kg)  10/15/20 159 lb 9.6 oz (72.4 kg)  09/23/20 161 lb (73 kg)   Constitutional: Normal weight, in NAD Eyes: PERRLA, EOMI, no exophthalmos ENT: moist mucous membranes, no thyromegaly, no cervical lymphadenopathy Cardiovascular: RRR, No MRG Respiratory: CTA B Gastrointestinal: abdomen soft, NT, ND, BS+ Musculoskeletal: no deformities, strength intact in all 4 Skin: moist, warm, no rashes Neurological: no tremor with outstretched hands, DTR normal in all 4  ASSESSMENT: 1. DM2, insulin-dependent, uncontrolled, with complications - Diabetic retinopathy left eye - resolved - Peripheral neuropathy  Component     Latest Ref Rng & Units 04/24/2019  Hemoglobin A1C     4.0 - 5.6 % 5.9 (A)  Islet Cell Ab     Neg:<1:1 Negative  ZNT8 Antibodies     U/mL <15  C-Peptide     0.80 - 3.85 ng/mL 2.35  Glucose, Plasma  65 - 99 mg/dL 130 (H)  Glutamic Acid Decarb Ab     <5 IU/mL <5  No insulin deficiency or pancreatic autoimmunity.  2. HL  3. PN   PLAN:  1. Patient with longstanding, previously uncontrolled, type 2 diabetes, on  metformin and basal-bolus insulin regimen, with improved control in the last few years.  After her gastric bypass surgery, she is grazing, and not having large meals and we reduced her insulin doses.  At last visit, blood sugars were excellent at night in the lower normal interval, and increasing slightly after breakfast but still within target and they were also increasing after dinner.  She was injecting NovoLog before dinner but not before the rest of the meals.  She was also skipping NovoLog doses if sugars were lower than 120 as she was afraid that she may drop her sugars overnight.  We reduced the Levemir at that time to allow her to safely take NovoLog before dinner. CGM interpretation: -At today's visit, we reviewed her CGM downloads: It appears that 86% of values are in target range (goal >70%), while 14% are higher than 180 (goal <25%), and 0% are lower than 70 (goal <4%).  The calculated average blood sugar is 135.  The projected HbA1c for the next 3 months (GMI) is 6.5%. -Reviewing the CGM trends, sugars are excellent overnight with the increase significantly after breakfast, lunch, and especially dinner.  Upon questioning, she did not increase the dose of NovoLog and still taking 3 units before each meal.  We discussed that especially in the setting of steroid use, she should not be increased to at least 5 units.  For now, even though the steroid is getting out of her system, I suggested to increase the doses to 4 to 5 units before each meal and only take 3 units for a particularly small meal.  The Levemir dose appears to be adequate.  We will not change this. - I advised her to:  Patient Instructions  Please continue: - Metformin 1000 mg 2x a day with meals - Levemir 13 units at bedtime  Please increase: - Novolog (3) 4-5 units 15 minutes before meals  Please come back for a follow-up appointment in 4 months.  - we checked her HbA1c: 6.7% (higher) - advised to check sugars at different  times of the day - 4x a day, rotating check times - advised for yearly eye exams >> she is UTD - return to clinic in 4 months   2. HL -Reviewed latest lipid panel from 05/2020: High LDL, the rest of fractions at goal: Lab Results  Component Value Date   CHOL 202 (H) 05/11/2020   HDL 71.30 05/11/2020   LDLCALC 115 (H) 05/11/2020   TRIG 79.0 05/11/2020   CHOLHDL 3 05/11/2020  -Continues pravastatin 20 mg daily without side effects  3. PN -Related to diabetes -Stable -I suggested alpha-lipoic acid in the past but she could not take it due to the fact that it was a large tablet -She continues on B12 vitamin, Cymbalta, and occasional Neurontin, per podiatry.  These are helping.  Philemon Kingdom, MD PhD [facility] Endocrinology

## 2020-10-29 NOTE — Patient Instructions (Addendum)
Please continue: - Metformin 1000 mg 2x a day with meals - Levemir 13 units at bedtime  Please increase: - Novolog (3) 4-5 units 15 minutes before meals  Please come back for a follow-up appointment in 4 months.

## 2020-11-02 ENCOUNTER — Other Ambulatory Visit: Payer: Self-pay | Admitting: Rheumatology

## 2020-11-03 NOTE — Telephone Encounter (Signed)
Next Visit: 11/30/2020   Last Visit: 09/23/2020   Last Fill: 10/04/2020   Dx: Other insomnia   Current Dose per office note on 09/23/2020: Ambien 5 mg 1 tablet by mouth at bedtime.   Okay to refill Ambien?

## 2020-11-08 ENCOUNTER — Telehealth: Payer: Self-pay

## 2020-11-10 ENCOUNTER — Telehealth: Payer: Self-pay

## 2020-11-15 ENCOUNTER — Other Ambulatory Visit: Payer: Self-pay

## 2020-11-15 ENCOUNTER — Ambulatory Visit (INDEPENDENT_AMBULATORY_CARE_PROVIDER_SITE_OTHER): Payer: Medicare Other

## 2020-11-15 DIAGNOSIS — Z111 Encounter for screening for respiratory tuberculosis: Secondary | ICD-10-CM

## 2020-11-16 NOTE — Progress Notes (Signed)
Office Visit Note  Patient: ELLIANNE [name]             Date of Birth: [birth year]           MRN: [id number]             PCP: Priscilla Hillock, DO Referring: Priscilla Hillock, DO Visit Date: 11/30/2020 Occupation: '@GUAROCC'$ @  Subjective:  Left knee pain   History of Present Illness: Priscilla [name] is a 70 y.o. female with history of psoriatic arthritis, osteoarthritis, DDD, and fibromyalgia.  She is not currently taking any immunosuppressive agents for psoriatic arthritis.  Her psoriatic arthritis has been in remission.  She was evaluated by Dr. Estanislado [name] on 09/23/2020 for left knee joint pain and swelling.  Her left knee joint was aspirated and injected with cortisone at that time.  Dr. Lucia [name] ordered an MRI of the left knee for further evaluation but the patient decided to cancel the MRI since her pain had improved and she was busy helping a family member at that time.  She states that she continues to have recurrent left knee joint effusions.  Her discomfort and swelling is exacerbated by activity.  She states that the stiffness is most severe first thing in the morning.  She has had increased instability and occasional falls.  She states that her right knee occasionally will swell as well.  She continues to have chronic pain and stiffness in both feet.  She denies any Achilles tendinitis or plantar fasciitis.  She has not had any psoriasis.  She continues to have some neck pain and stiffness but denies any symptoms of radiculopathy.  She has occasional myalgias and muscle tenderness due to fibromyalgia but overall her discomfort has been tolerable.  She states that her energy level has improved and she has been sleeping well at night taking Ambien 5 mg at bedtime.    Activities of Daily Living:  Patient reports morning stiffness for 1 hour.   Patient Reports nocturnal pain.  Difficulty dressing/grooming: Denies Difficulty climbing stairs: Reports Difficulty getting out of chair:  Denies Difficulty using hands for taps, buttons, cutlery, and/or writing: Reports  Review of Systems  Constitutional:  Positive for fatigue.  HENT:  Negative for mouth sores, mouth dryness and nose dryness.   Eyes:  Positive for dryness. Negative for pain and itching.  Respiratory:  Negative for shortness of breath and difficulty breathing.   Cardiovascular:  Negative for chest pain and palpitations.  Gastrointestinal:  Negative for blood in stool, constipation and diarrhea.  Endocrine: Negative for increased urination.  Genitourinary:  Positive for involuntary urination. Negative for difficulty urinating.  Musculoskeletal:  Positive for joint pain, joint pain, joint swelling, myalgias, morning stiffness, muscle tenderness and myalgias.  Skin:  Positive for color change and rash. Negative for redness.  Allergic/Immunologic: Negative for susceptible to infections.  Neurological:  Positive for dizziness and numbness. Negative for headaches and memory loss.  Hematological:  Negative for bruising/bleeding tendency.  Psychiatric/Behavioral:  Negative for confusion.    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   Gastric bypass status for obesity 08/31/2016   Iron deficiency anemia 04/28/2016   Other insomnia 03/14/2016   DDD (degenerative  disc disease), cervical 03/14/2016   Spondylosis without myelopathy or radiculopathy, lumbar region 01/31/2016   Anxiety and depression 06/25/2014   Psoriatic arthritis - sees rheumatologist 02/20/2014   Asthma 12/14/2008   Diabetic peripheral neuropathy associated with type 2 diabetes mellitus ([place]) 08/06/2008   Hyperlipemia 11/12/2006   Essential  hypertension 11/12/2006    Past Medical History:  Diagnosis Date   Anemia 12/14/2008   Anxiety and depression 12/14/2008   Asthma 11/12/2006   CAD (coronary artery disease) 12/14/2008   hx transluminal coronary angioplasty   Cataract    Depression    Diabetes mellitus with neuropathy ([place]) 12/14/2008   Fibromyalgia 01/04/2007   sees rheuamtology   Gastric bypass status for obesity 08/31/2016   Hyperlipemia 01/21/2010   Hypertension 12/14/2008   Leg edema 12/14/2008   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   OSA (obstructive sleep apnea) 08/06/2008   Osteoarthritis 12/14/2008   Palpitations 11/12/2006   hx sinus tachy   Psoriatic arthritis ([place] Priscilla [name])    sees rheumatology   Rotator cuff injury     Family History  Problem Relation Age of Onset   Arthritis Mother    Alzheimer's disease Mother    Stroke Father    Alcohol abuse Father    Diabetes Father    Heart disease Father    Early death Father 23   Diabetes Sister    Arthritis Sister    COPD Sister    Arthritis Maternal Aunt    Diabetes Maternal Aunt    Early death Maternal Aunt 50   Diabetes Maternal Uncle    Cancer Paternal Aunt    COPD Paternal Aunt    Heart disease Paternal Aunt    Heart disease Paternal Uncle    Alcohol abuse Paternal Grandfather    Heart disease Paternal Grandfather    Early death Paternal Grandfather 3   Stroke Paternal Grandfather    Hypertension Son    Autoimmune disease Daughter    Breast cancer Neg Hx    Past Surgical History:  Procedure Laterality Date   ABDOMINAL HYSTERECTOMY     CHOLECYSTECTOMY     LAPAROSCOPIC GASTRIC BANDING     Lumbar Facet Joint Intra-Articular Injection(s) with Fluoroscopic Guidance  09/13/2020   L4-5; Dr. Ernestina [name]   PTCA     WRIST SURGERY     LEFT   Social History   Social History Narrative   Married, Priscilla [name]. 2 children name Priscilla [name] and Priscilla [name].   Some college, retired Engineer, manufacturing.   Drinks caffeine.   Take a daily vitamin.   Wears her seatbelt, exercises 3  times a week.   Smoke detector in the home.   Firearms in the home.   Feels safe in her relationships.   Immunization History  Administered Date(s) Administered   Fluad Quad(high Dose 65+) 12/03/2018, 01/29/2020   Influenza Split 01/21/2013   Influenza, High Dose Seasonal PF 02/04/2015, 02/09/2016, 02/11/2018   Influenza,inj,Quad PF,6+ Mos 01/15/2014   Influenza-Unspecified 01/26/2017   Moderna Sars-Covid-2 Vaccination 05/24/2019, 06/21/2019, 12/22/2019   PPD Test 11/15/2020   Pneumococcal Conjugate-13 02/04/2015   Pneumococcal Polysaccharide-23 10/31/2013, 01/29/2020   Tdap 02/07/2013     Objective: Vital Signs: BP (!) 142/77 (BP Location: Right Arm, Patient Position: Sitting, Cuff Size: Normal)   Pulse 64   Resp 16   Ht '5\' 3"'$  (1.6 m)   Wt 161 lb 12.8 oz (73.4 kg)   LMP  (LMP Unknown)   BMI 28.66 kg/m    Physical Exam Vitals and nursing note reviewed.  Constitutional:      Appearance: She is well-developed.  HENT:     Head: Normocephalic and atraumatic.  Eyes:     Conjunctiva/sclera: Conjunctivae normal.  Pulmonary:     Effort: Pulmonary effort is normal.  Abdominal:     Palpations: Abdomen is soft.  Musculoskeletal:     Cervical back: Normal range of motion.  Skin:    General: Skin is warm and dry.     Capillary Refill: Capillary refill takes less than 2 seconds.  Neurological:     Mental Status: She is alert and oriented to person, place, and time.  Psychiatric:        Behavior: Behavior normal.     Musculoskeletal Exam: C-spine has slightly limited range of motion with lateral rotation to the left.  Thoracic and lumbar spine have good range of motion.  No midline spinal tenderness or SI joint tenderness.  Shoulder joints, elbow joints, wrist joints, MCPs, PIPs, DIPs have good range of motion with no synovitis.  PIP and DIP prominence consistent with osteoarthritis of both hands noted.  Complete fist formation bilaterally.  Hip joints have good range of motion  with no discomfort.  Small effusion in the right knee and moderate effusion in the left knee noted.  Ankle joints have good range of motion with no tenderness or joint swelling.  No evidence of Achilles tendinitis or plantar fasciitis.  PIP and DIP thickening consistent with osteoarthritis of both feet noted.  Hammertoes noted bilaterally.  CDAI Exam: CDAI Score: -- Patient Global: --; Provider Global: -- Swollen: 1 ; Tender: 1  Joint Exam 11/30/2020      Right  Left  Knee     Swollen Tender     Investigation: No additional findings.  Imaging: DG Bone Density  Result Date: 11/25/2020 EXAM: DUAL X-RAY ABSORPTIOMETRY (DXA) FOR BONE MINERAL DENSITY IMPRESSION: Referring Physician:  RENEE A [name] Your patient completed a bone mineral density test using GE Lunar iDXA system (analysis version: 16). Technologist: [place] PATIENT: Name: Priscilla [name], Priscilla [name] Patient ID: [id number] Birth Date: 12/14/1950 Height: 63.0 in. Sex: Female Measured: 11/25/2020 Weight: 156.8 lbs. Indications: Advanced Age, Bariatric surgery, Bilateral Ovariectomy (65.51), Caucasian, cymbalta, Depression, Height Loss (781.91), Hysterectomy, Insulin for Diabetes, Postmenopausal, Zoloft Fractures: None Treatments: Calcium (E943.0), Vitamin D (E933.5) ASSESSMENT: The BMD measured at Forearm Radius 33% is 0.842 g/cm2 with a T-score of -0.5. This patient is considered normal according to [facility]) criteria. The quality of the exam is good. The lumbar spine was excluded due to degenerative changes. Site Region Measured Date Measured Age YA BMD Significant CHANGE T-score Left Forearm Radius 33% 11/25/2020 71.2 -0.5 0.842 g/cm2 DualFemur Neck Right 11/25/2020 71.2 1.3 1.237 g/cm2 DualFemur Neck Right 08/30/2015 66.0 1.4 1.248 g/cm2 DualFemur Total Mean 11/25/2020 71.2 1.8 1.229 g/cm2 * DualFemur Total Mean 08/30/2015 66.0 2.5 1.320 g/cm2 World Health Organization [facility]) criteria for post-menopausal, Caucasian Women: Normal        T-score at or above -1 SD Osteopenia   T-score between -1 and -2.5 SD Osteoporosis T-score at or below -2.5 SD RECOMMENDATION: 1. All patients should optimize calcium and vitamin D intake. 2. Consider FDA-approved medical therapies in postmenopausal women and men aged 66 years and older, based on the following: a. A hip or vertebral (clinical or morphometric) fracture. b. T-score = -2.5 at the femoral neck or spine after appropriate evaluation to exclude secondary causes. c. Low bone mass (T-score between -1.0 and -2.5 at the femoral neck or spine) and  a 10-year probability of a hip fracture = 3% or a 10-year probability of a major osteoporosis-related fracture = 20% based on the [place]-adapted WHO algorithm. d. Clinician judgment and/or patient preferences may indicate treatment for [name] with 10-year fracture probabilities above or below these levels. FOLLOW-UP: Patients with diagnosis of osteoporosis or at high risk for fracture should have regular bone mineral density tests.? Patients eligible for Medicare are allowed routine testing every 2 years.? The testing frequency can be increased to one year for patients who have rapidly progressing disease, are receiving or discontinuing medical therapy to restore bone mass, or have additional risk factors. I have reviewed this study and agree with the findings. [facility] Electronically Signed   By: Rolm Baptise M.D.   On: 11/25/2020 11:03   MM 3D SCREEN BREAST BILATERAL  Result Date: 11/26/2020 CLINICAL DATA:  Screening. EXAM: DIGITAL SCREENING BILATERAL MAMMOGRAM WITH TOMOSYNTHESIS AND CAD TECHNIQUE: Bilateral screening digital craniocaudal and mediolateral oblique mammograms were obtained. Bilateral screening digital breast tomosynthesis was performed. The images were evaluated with computer-aided detection. COMPARISON:  Previous exam(s). ACR Breast Density Category b: There are scattered areas of fibroglandular density. FINDINGS: There are no  findings suspicious for malignancy. IMPRESSION: No mammographic evidence of malignancy. A result letter of this screening mammogram will be mailed directly to the patient. RECOMMENDATION: Screening mammogram in one year. (Code:SM-B-01Y) BI-RADS CATEGORY  1: Negative. Electronically Signed   By: Lillia Mountain M.D.   On: 11/26/2020 11:08    Recent Labs: Lab Results  Component Value Date   WBC 7.2 06/03/2020   HGB 13.6 06/03/2020   PLT 293 06/03/2020   NA 139 06/03/2020   K 4.8 06/03/2020   CL 101 06/03/2020   CO2 33 (H) 06/03/2020   GLUCOSE 102 (H) 06/03/2020   BUN 25 (H) 06/03/2020   CREATININE 0.84 06/03/2020   BILITOT 0.3 06/03/2020   ALKPHOS 48 06/03/2020   AST 11 (L) 06/03/2020   ALT 10 06/03/2020   PROT 6.7 06/03/2020   ALBUMIN 4.3 06/03/2020   CALCIUM 9.8 06/03/2020   GFRAA >60 11/19/2019    Speciality Comments: No specialty comments available.  Procedures:  No procedures performed Allergies: Patient has no known allergies.    Assessment / Plan:     Visit Diagnoses: Psoriatic arthritis ([place]) - In remission: She is not currently taking any immunosuppressive agents.  She presents today with pain and a moderate effusion in the left knee.  At her last appointment on 09/23/2020 with Dr. Estanislado [name] the left knee joint was aspirated and injected with cortisone at that time.  The fluid was sent for analysis which did not reveal any crystals and cultures were negative.  She noticed some improvement in her left knee joint pain so she did not proceed with an MRI of the left knee.  She continues to have recurrent left knee joint effusions exacerbated by physical activity.  She has also noticed occasional swelling in the right knee and has a small effusion on examination today.  She has not experiencing any other joint pain or inflammation at this time.  She has no other clinical features of psoriatic arthritis currently.  No evidence of Achilles tendinitis or plantar fasciitis.  No SI joint  tenderness to palpation.  She has no active psoriasis currently.  We discussed proceeding with an MRI of the left knee for further evaluation due to the recurrence of fluid, ongoing pain, and instability in the left knee.  Psoriasis: She has no active psoriasis  at this time.   Primary osteoarthritis of both hands: She has PIP and DIP prominence consistent with OA of both hands.  No tenderness or inflammation noted.  Complete fist formation bilaterally.   Trochanteric bursitis, right hip -Improved.  She had a cortisone injection performed by Dr. Ernestina [name] on 10/26/20.   Effusion, left knee -She presents today with a moderate effusion in the left knee joint.  She has been experiencing recurrent effusions in the left knee which has been exacerbated by physical activity.  She has also noticed some increased instability and has had several falls recently.  She was evaluated on 09/23/2020 and had updated x-rays at that time which showed moderate osteoarthritis and moderate chondromalacia patella. The left knee was aspirated on 09/23/2020 and injected with cortisone.  Synovial analysis: negative for crystals and culture was negative.  She noticed some improvement in her symptoms after the cortisone injection and decided to cancel the MRI of her left knee since she was busy helping a family member at that time.  She continues to have persistent pain and stiffness in the left knee as well as recurrent effusions.  She has tried performing lower extremity muscle strengthening exercises on a daily basis for over 6 weeks without any improvement in her symptoms.  A new order for MRI of the left knee will be placed today.    Pain in both feet: She continues to have chronic pain and stiffness in both feet.  PIP and DIP thickening consistent with OA of both feet noted.  Hammertoes noted bilaterally.  Subluxation and thickening of the left ankle joint noted. She has been seeing a podiatrist on a regular basis.    DDD  (degenerative disc disease), cervical: She has ongoing pain and stiffness in her neck.  Slightly limited lateral rotation to the left.  No symptoms of radiculopathy at this time.   DDD (degenerative disc disease), lumbar - She is followed by Dr. Ernestina [name].  She had a facet injection on 09/13/20, which provided significant pain relief.   History of scoliosis  Fibromyalgia - She has not had any recent fibromyalgia flares.  She has occasional muscle tenderness but overall her discomfort has been tolerable.  She continues to take Cymbalta 60 mg 1 capsule daily.  Overall she feels as though her energy level has improved and she has been sleeping well at night taking Ambien 5 mg at bedtime.  We discussed the importance of regular exercise and good sleep hygiene.  Other insomnia - She continues to take Ambien 5 mg 1 tablet by mouth at bedtime for insomnia.  Other fatigue: Improving.  Discussed the importance of regular exercise.   History of vitamin D deficiency: She is taking a calcium and vitamin D supplement daily.  DEXA updated on 11/25/20:The BMD measured at Forearm Radius 33% is 0.842 g/cm2 with a T-score of -0.5. This patient is considered normal according to [facility]) criteria.  Repeat DEXA in 5 years.  She has had several recent falls but no fractures.   Other medical conditions are listed as follows:   History of anemia  Anxiety and depression  History of asthma  History of sleep apnea  Gastric bypass status for obesity  History of diabetes mellitus  Orders: Orders Placed This Encounter  Procedures   MR KNEE LEFT WO CONTRAST    No orders of the defined types were placed in this encounter.   Follow-Up Instructions: Return in about 5 months (around 05/02/2021) for Psoriatic arthritis, Osteoarthritis,  Fibromyalgia.   Ofilia Neas, PA-C  Note - This record has been created using Dragon software.  Chart creation errors have been sought, but may not always   have been located. Such creation errors do not reflect on  the standard of medical care.

## 2020-11-17 ENCOUNTER — Ambulatory Visit: Payer: Medicare Other

## 2020-11-17 ENCOUNTER — Other Ambulatory Visit: Payer: Self-pay

## 2020-11-17 DIAGNOSIS — Z111 Encounter for screening for respiratory tuberculosis: Secondary | ICD-10-CM

## 2020-11-17 LAB — TB SKIN TEST
Induration: 0 mm
TB Skin Test: NEGATIVE

## 2020-11-17 NOTE — Progress Notes (Signed)
Pt here for tb reading

## 2020-11-18 NOTE — Telephone Encounter (Signed)
Inbound fax from [facility] requesting clinical notes be faxed. OV notes routed to [phone] via Epic

## 2020-11-25 ENCOUNTER — Other Ambulatory Visit: Payer: Self-pay

## 2020-11-25 ENCOUNTER — Encounter: Payer: Self-pay | Admitting: Hematology & Oncology

## 2020-11-25 ENCOUNTER — Ambulatory Visit
Admission: RE | Admit: 2020-11-25 | Discharge: 2020-11-25 | Disposition: A | Discharge status: Home / Self Care | Payer: Medicare Other | Source: Ambulatory Visit | Attending: Family Medicine | Admitting: Family Medicine

## 2020-11-25 DIAGNOSIS — E2839 Other primary ovarian failure: Secondary | ICD-10-CM

## 2020-11-25 DIAGNOSIS — Z1231 Encounter for screening mammogram for malignant neoplasm of breast: Secondary | ICD-10-CM | POA: Diagnosis not present

## 2020-11-25 DIAGNOSIS — Z780 Asymptomatic menopausal state: Secondary | ICD-10-CM | POA: Diagnosis not present

## 2020-11-30 ENCOUNTER — Other Ambulatory Visit: Payer: Self-pay

## 2020-11-30 ENCOUNTER — Encounter: Payer: Self-pay | Admitting: Physician Assistant

## 2020-11-30 ENCOUNTER — Ambulatory Visit (INDEPENDENT_AMBULATORY_CARE_PROVIDER_SITE_OTHER): Payer: Medicare Other | Admitting: Physician Assistant

## 2020-11-30 VITALS — BP 142/77 | HR 64 | Resp 16 | Ht 63.0 in | Wt 161.8 lb

## 2020-11-30 DIAGNOSIS — Z8739 Personal history of other diseases of the musculoskeletal system and connective tissue: Secondary | ICD-10-CM

## 2020-11-30 DIAGNOSIS — R5383 Other fatigue: Secondary | ICD-10-CM | POA: Diagnosis not present

## 2020-11-30 DIAGNOSIS — M19041 Primary osteoarthritis, right hand: Secondary | ICD-10-CM

## 2020-11-30 DIAGNOSIS — Z9884 Bariatric surgery status: Secondary | ICD-10-CM

## 2020-11-30 DIAGNOSIS — G4709 Other insomnia: Secondary | ICD-10-CM

## 2020-11-30 DIAGNOSIS — M79671 Pain in right foot: Secondary | ICD-10-CM | POA: Diagnosis not present

## 2020-11-30 DIAGNOSIS — F32A Depression, unspecified: Secondary | ICD-10-CM

## 2020-11-30 DIAGNOSIS — M25462 Effusion, left knee: Secondary | ICD-10-CM

## 2020-11-30 DIAGNOSIS — L4050 Arthropathic psoriasis, unspecified: Secondary | ICD-10-CM | POA: Diagnosis not present

## 2020-11-30 DIAGNOSIS — M7061 Trochanteric bursitis, right hip: Secondary | ICD-10-CM | POA: Diagnosis not present

## 2020-11-30 DIAGNOSIS — M5136 Other intervertebral disc degeneration, lumbar region: Secondary | ICD-10-CM | POA: Diagnosis not present

## 2020-11-30 DIAGNOSIS — L409 Psoriasis, unspecified: Secondary | ICD-10-CM | POA: Diagnosis not present

## 2020-11-30 DIAGNOSIS — M79672 Pain in left foot: Secondary | ICD-10-CM

## 2020-11-30 DIAGNOSIS — M797 Fibromyalgia: Secondary | ICD-10-CM | POA: Diagnosis not present

## 2020-11-30 DIAGNOSIS — Z8709 Personal history of other diseases of the respiratory system: Secondary | ICD-10-CM

## 2020-11-30 DIAGNOSIS — F419 Anxiety disorder, unspecified: Secondary | ICD-10-CM

## 2020-11-30 DIAGNOSIS — M5030 Other cervical disc degeneration, unspecified cervical region: Secondary | ICD-10-CM

## 2020-11-30 DIAGNOSIS — Z862 Personal history of diseases of the blood and blood-forming organs and certain disorders involving the immune mechanism: Secondary | ICD-10-CM

## 2020-11-30 DIAGNOSIS — M19042 Primary osteoarthritis, left hand: Secondary | ICD-10-CM

## 2020-11-30 DIAGNOSIS — Z8639 Personal history of other endocrine, nutritional and metabolic disease: Secondary | ICD-10-CM

## 2020-11-30 DIAGNOSIS — M51369 Other intervertebral disc degeneration, lumbar region without mention of lumbar back pain or lower extremity pain: Secondary | ICD-10-CM

## 2020-11-30 DIAGNOSIS — Z8669 Personal history of other diseases of the nervous system and sense organs: Secondary | ICD-10-CM

## 2020-12-01 ENCOUNTER — Other Ambulatory Visit: Payer: Self-pay | Admitting: Rheumatology

## 2020-12-01 ENCOUNTER — Encounter: Payer: Self-pay | Admitting: Family Medicine

## 2020-12-02 ENCOUNTER — Ambulatory Visit: Payer: Medicare Other | Admitting: Hematology & Oncology

## 2020-12-02 ENCOUNTER — Other Ambulatory Visit: Payer: Medicare Other

## 2020-12-02 NOTE — Telephone Encounter (Signed)
Next Visit: 05/02/2021  Last Visit: 11/30/2020  Last Fill: 11/03/2020  Dx: Other insomnia   Current Dose per office note on 11/30/2020: Ambien 5 mg 1 tablet by mouth at bedtime for insomnia.  Okay to refill Ambien?

## 2020-12-03 ENCOUNTER — Inpatient Hospital Stay: Payer: Medicare Other | Attending: Hematology & Oncology

## 2020-12-03 ENCOUNTER — Encounter: Payer: Self-pay | Admitting: Hematology & Oncology

## 2020-12-03 ENCOUNTER — Other Ambulatory Visit: Payer: Self-pay

## 2020-12-03 ENCOUNTER — Inpatient Hospital Stay (HOSPITAL_BASED_OUTPATIENT_CLINIC_OR_DEPARTMENT_OTHER): Payer: Medicare Other | Admitting: Hematology & Oncology

## 2020-12-03 VITALS — BP 120/90 | HR 64 | Temp 99.3°F | Resp 17 | Ht 63.0 in | Wt 159.1 lb

## 2020-12-03 DIAGNOSIS — D508 Other iron deficiency anemias: Secondary | ICD-10-CM

## 2020-12-03 DIAGNOSIS — D500 Iron deficiency anemia secondary to blood loss (chronic): Secondary | ICD-10-CM | POA: Diagnosis not present

## 2020-12-03 DIAGNOSIS — D509 Iron deficiency anemia, unspecified: Secondary | ICD-10-CM | POA: Insufficient documentation

## 2020-12-03 DIAGNOSIS — Z9884 Bariatric surgery status: Secondary | ICD-10-CM

## 2020-12-03 DIAGNOSIS — E119 Type 2 diabetes mellitus without complications: Secondary | ICD-10-CM | POA: Insufficient documentation

## 2020-12-03 DIAGNOSIS — K909 Intestinal malabsorption, unspecified: Secondary | ICD-10-CM | POA: Insufficient documentation

## 2020-12-03 LAB — CBC WITH DIFFERENTIAL (CANCER CENTER ONLY)
Abs Immature Granulocytes: 0.06 10*3/uL (ref 0.00–0.07)
Basophils Absolute: 0.1 10*3/uL (ref 0.0–0.1)
Basophils Relative: 1 %
Eosinophils Absolute: 0.4 10*3/uL (ref 0.0–0.5)
Eosinophils Relative: 5 %
HCT: 41.6 % (ref 36.0–46.0)
Hemoglobin: 13.4 g/dL (ref 12.0–15.0)
Immature Granulocytes: 1 %
Lymphocytes Relative: 25 %
Lymphs Abs: 2.1 10*3/uL (ref 0.7–4.0)
MCH: 29.1 pg (ref 26.0–34.0)
MCHC: 32.2 g/dL (ref 30.0–36.0)
MCV: 90.2 fL (ref 80.0–100.0)
Monocytes Absolute: 0.7 10*3/uL (ref 0.1–1.0)
Monocytes Relative: 9 %
Neutro Abs: 4.9 10*3/uL (ref 1.7–7.7)
Neutrophils Relative %: 59 %
Platelet Count: 316 10*3/uL (ref 150–400)
RBC: 4.61 MIL/uL (ref 3.87–5.11)
RDW: 13 % (ref 11.5–15.5)
WBC Count: 8.1 10*3/uL (ref 4.0–10.5)
nRBC: 0 % (ref 0.0–0.2)

## 2020-12-03 LAB — IRON AND TIBC
Iron: 66 ug/dL (ref 41–142)
Saturation Ratios: 23 % (ref 21–57)
TIBC: 288 ug/dL (ref 236–444)
UIBC: 221 ug/dL (ref 120–384)

## 2020-12-03 LAB — RETICULOCYTES
Immature Retic Fract: 12.6 % (ref 2.3–15.9)
RBC.: 4.67 MIL/uL (ref 3.87–5.11)
Retic Count, Absolute: 74.3 10*3/uL (ref 19.0–186.0)
Retic Ct Pct: 1.6 % (ref 0.4–3.1)

## 2020-12-03 LAB — FERRITIN: Ferritin: 172 ng/mL (ref 11–307)

## 2020-12-03 NOTE — Progress Notes (Signed)
Hematology and Oncology Follow Up Visit  Priscilla [name]:[id number] [birth year] 70 y.o. 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 is here today for follow-up.  We last saw her 6 months ago.  Unfortunately, the problem is that her husband now has dementia.  He is becoming a little bit worse with his memory.  She is having to watch out for him elevate more.  She is also having some back issues.  She is getting some steroid injections which have helped.  We last saw her in February, her ferritin was 173 with an iron saturation of 27%.  She has had no change in bowel or bladder habits.  She does have diabetes.  She tries to watch out for her blood sugars.  She has had a mammogram recently.  This was done on 11/25/2020.  Mammogram looks fantastic.  She said that her last colonoscopy was about 3 years ago.  There is been no problems with COVID.  Overall, her performance status is ECOG 0.   Medications:  Allergies as of 12/03/2020   No Known Allergies      Medication List        Accurate as of December 03, 2020 10:08 AM. If you have any questions, ask your nurse or doctor.          albuterol 108 (90 Base) MCG/ACT inhaler Commonly known as: VENTOLIN HFA Inhale 2 puffs into the lungs every 6 (six) hours as needed for wheezing or shortness of breath.   aspirin 81 MG tablet Take 81 mg by mouth daily.   B-D INS SYR MICROFINE 1CC/27G 27G X 5/8" 1 ML Misc Generic drug: Insulin Syringe-Needle U-100 Use to inject levemir once daily DX: E11.319   Biotin 5000 MCG Tabs Take 5,000 mcg by mouth daily.   calcium-vitamin D 250-100 MG-UNIT tablet Take 1 tablet by mouth daily.   COLLAGEN 1500/C PO Take by mouth.   COLLAGEN PO Take by mouth. Adds to tea daily   DULoxetine 60 MG capsule Commonly known as: CYMBALTA Take 1  capsule (60 mg total) by mouth daily.   FreeStyle Libre 2 Sensor Misc Use sensors with CGM to check sugars.   insulin detemir 100 UNIT/ML injection Commonly known as: Levemir Inject 0.13-0.15 mLs (13-15 Units total) into the skin at bedtime.   metFORMIN 1000 MG tablet Commonly known as: GLUCOPHAGE TAKE 1 TABLET BY MOUTH TWICE DAILY WITH MEALS   metoprolol tartrate 25 MG tablet Commonly known as: LOPRESSOR TAKE 1 TABLET(25 MG) BY MOUTH TWICE DAILY   multivitamin tablet Take 1 tablet by mouth daily.   NovoLOG FlexPen 100 UNIT/ML FlexPen Generic drug: insulin aspart Inject 2-4 Units into the skin 3 (three) times daily with meals.   OneTouch Verio test strip Generic drug: glucose blood USE TO TEST BLOOD SUGAR TWICE A DAY DX: E11.319   pravastatin 40 MG tablet Commonly known as: PRAVACHOL Take 1 tablet (40 mg total) by mouth daily.   sertraline 100 MG tablet Commonly known as: ZOLOFT Take 1 tablet (100 mg total) by mouth daily.   VITAMIN B12 PO Place under the tongue.   Vitamin D (Ergocalciferol) 1.25 MG (50000 UNIT) Caps capsule Commonly known as: DRISDOL TAKE 1 CAPSULE BY MOUTH EVERY 7 DAYS   ZINC 15 PO Take by mouth.   ZINC  PO Take by mouth daily.   zolpidem 5 MG tablet Commonly known as: AMBIEN TAKE 1 TABLET(5 MG) BY MOUTH AT BEDTIME AS NEEDED FOR SLEEP        Allergies: No Known Allergies  Past Medical History, Surgical history, Social history, and Family History were reviewed and updated.  Review of Systems: All other 10 point review of systems is negative.   Physical Exam:  height is '5\' 3"'$  (1.6 m) and weight is 159 lb 1.9 oz (72.2 kg). Her oral temperature is 99.3 F (37.4 C). Her blood pressure is 120/90 and her pulse is 64. Her respiration is 17 and oxygen saturation is 100%.   Wt Readings from Last 3 Encounters:  12/03/20 159 lb 1.9 oz (72.2 kg)  11/30/20 161 lb 12.8 oz (73.4 kg)  10/29/20 159 lb 3.2 oz (72.2 kg)    Ocular: Sclerae  unicteric, pupils equal, round and reactive to light Ear-nose-throat: Oropharynx clear, dentition fair Lymphatic: No cervical or supraclavicular adenopathy Lungs no rales or rhonchi, good excursion bilaterally Heart regular rate and rhythm, no murmur appreciated Abd soft, nontender, positive bowel sounds MSK no focal spinal tenderness, no joint edema Neuro: non-focal, well-oriented, appropriate affect Breasts: Deferred   Lab Results  Component Value Date   WBC 8.1 12/03/2020   HGB 13.4 12/03/2020   HCT 41.6 12/03/2020   MCV 90.2 12/03/2020   PLT 316 12/03/2020   Lab Results  Component Value Date   FERRITIN 173 06/03/2020   IRON 78 06/03/2020   TIBC 285 06/03/2020   UIBC 207 06/03/2020   IRONPCTSAT 27 06/03/2020   Lab Results  Component Value Date   RETICCTPCT 1.6 12/03/2020   RBC 4.67 12/03/2020   No results found for: KPAFRELGTCHN, LAMBDASER, KAPLAMBRATIO No results found for: IGGSERUM, IGA, IGMSERUM No results found for: Odetta Pink, SPEI   Chemistry      Component Value Date/Time   NA 139 06/03/2020 0759   NA 141 10/13/2016 1503   K 4.8 06/03/2020 0759   K 4.9 10/13/2016 1503   CL 101 06/03/2020 0759   CL 103 10/13/2016 1503   CO2 33 (H) 06/03/2020 0759   CO2 28 10/13/2016 1503   BUN 25 (H) 06/03/2020 0759   BUN 16 10/13/2016 1503   CREATININE 0.84 06/03/2020 0759   CREATININE 0.90 10/13/2016 1503      Component Value Date/Time   CALCIUM 9.8 06/03/2020 0759   CALCIUM 9.5 10/13/2016 1503   ALKPHOS 48 06/03/2020 0759   ALKPHOS 72 10/13/2016 1503   AST 11 (L) 06/03/2020 0759   ALT 10 06/03/2020 0759   BILITOT 0.3 06/03/2020 0759       Impression and Plan: [name] is a very pleasant 70 yo caucasian female with iron deficiency anemia secondary to malabsorption after lap band surgery.    I would have to believe that her iron studies should be okay.  At this point, I think we just have her come  back as needed.  I know she has a lot on her plate with respect to her husband.  I do not want to interfere with her family.  It is always fun talking to her.  We will certainly stay strong and prayer for her and her family.   Volanda Napoleon, MD 8/26/202210:08 AM

## 2020-12-06 ENCOUNTER — Telehealth: Payer: Self-pay | Admitting: *Deleted

## 2020-12-06 NOTE — Telephone Encounter (Signed)
No 12/03/20 LOS

## 2020-12-07 ENCOUNTER — Other Ambulatory Visit: Payer: Medicare Other

## 2020-12-07 ENCOUNTER — Telehealth: Payer: Self-pay

## 2020-12-07 ENCOUNTER — Ambulatory Visit: Payer: Medicare Other | Admitting: Hematology & Oncology

## 2020-12-07 NOTE — Telephone Encounter (Signed)
No 8/At this point, I think we just have her come back as needed.  I know she has a lot on her plate with respect to her husband.  I do not want to interfere with her family. 26/22 LOS noted but chart note states "  Webb Silversmith 12/07/20

## 2020-12-08 ENCOUNTER — Telehealth: Payer: Self-pay

## 2020-12-08 ENCOUNTER — Ambulatory Visit (HOSPITAL_COMMUNITY)
Admission: RE | Admit: 2020-12-08 | Discharge: 2020-12-08 | Disposition: A | Discharge status: Home / Self Care | Payer: Medicare Other | Source: Ambulatory Visit | Attending: Physician Assistant | Admitting: Physician Assistant

## 2020-12-08 ENCOUNTER — Other Ambulatory Visit: Payer: Self-pay

## 2020-12-08 DIAGNOSIS — L4050 Arthropathic psoriasis, unspecified: Secondary | ICD-10-CM | POA: Diagnosis not present

## 2020-12-08 DIAGNOSIS — M25462 Effusion, left knee: Secondary | ICD-10-CM | POA: Diagnosis not present

## 2020-12-08 DIAGNOSIS — M23242 Derangement of anterior horn of lateral meniscus due to old tear or injury, left knee: Secondary | ICD-10-CM | POA: Diagnosis not present

## 2020-12-08 NOTE — Telephone Encounter (Signed)
Inbound fax requesting forms be completed and faxed with recent clinical notes. Forms completed and faxed to [phone]

## 2020-12-09 ENCOUNTER — Ambulatory Visit (INDEPENDENT_AMBULATORY_CARE_PROVIDER_SITE_OTHER): Payer: Medicare Other | Admitting: Podiatry

## 2020-12-09 DIAGNOSIS — Z79899 Other long term (current) drug therapy: Secondary | ICD-10-CM | POA: Diagnosis not present

## 2020-12-09 DIAGNOSIS — M2041 Other hammer toe(s) (acquired), right foot: Secondary | ICD-10-CM | POA: Diagnosis not present

## 2020-12-09 DIAGNOSIS — M2042 Other hammer toe(s) (acquired), left foot: Secondary | ICD-10-CM | POA: Diagnosis not present

## 2020-12-10 NOTE — Progress Notes (Signed)
I attempted to call the patient to discuss MRI results.  Ok to review results with her.  I would recommend a referral to orthopedics to further discuss the meniscal tear prior to following back up with [place] to discuss the mild synovitis.   I can try calling her back Tuesday if she has any additional questions.

## 2020-12-11 NOTE — Progress Notes (Signed)
She presents today for follow-up of her onychomycosis and Lamisil therapy.  She is finished her every other day dosing and states that the has noticed new nail growth.  She is concerned with the shifting of the third and fourth toes.  Objective: Vital signs are stable she alert and oriented x3.  Pulses are palpable.  Her toenails look much better.  She does have flexible hammertoe deformities #3 #4 left foot.  Assessment: Well-healing onychomycosis.  Hammertoe deformities.  Plan: Follow-up with her in a few weeks after she considers a release of the flexor tendons.

## 2020-12-21 ENCOUNTER — Ambulatory Visit: Payer: Medicare Other | Admitting: Family Medicine

## 2020-12-22 ENCOUNTER — Ambulatory Visit: Payer: Medicare Other | Admitting: Internal Medicine

## 2020-12-30 ENCOUNTER — Other Ambulatory Visit: Payer: Self-pay | Admitting: Rheumatology

## 2020-12-31 NOTE — Telephone Encounter (Signed)
Next Visit: 05/02/2021   Last Visit: 11/30/2020   Last Fill: 12/02/2020   Dx: Other insomnia    Current Dose per office note on 11/30/2020: Ambien 5 mg 1 tablet by mouth at bedtime for insomnia.   Okay to refill Ambien?

## 2021-01-05 ENCOUNTER — Encounter: Payer: Self-pay | Admitting: Orthopaedic Surgery

## 2021-01-05 ENCOUNTER — Telehealth: Payer: Self-pay

## 2021-01-05 ENCOUNTER — Ambulatory Visit (INDEPENDENT_AMBULATORY_CARE_PROVIDER_SITE_OTHER): Payer: Medicare Other | Admitting: Orthopaedic Surgery

## 2021-01-05 DIAGNOSIS — M5030 Other cervical disc degeneration, unspecified cervical region: Secondary | ICD-10-CM

## 2021-01-05 DIAGNOSIS — M1712 Unilateral primary osteoarthritis, left knee: Secondary | ICD-10-CM | POA: Diagnosis not present

## 2021-01-05 NOTE — Telephone Encounter (Signed)
Noted  

## 2021-01-05 NOTE — Progress Notes (Signed)
Office Visit Note   Patient: Priscilla [name]           Date of Birth: [birth year]           MRN: [id number] Visit Date: 01/05/2021              Requested by: Ma Hillock, DO [address],  [place] [address] PCP: Ma Hillock, DO   Assessment & Plan: Visit Diagnoses:  1. DDD (degenerative disc disease), cervical   2. Primary osteoarthritis of left knee     Plan: In regards to her knee she would like to stay as conservative possible.  Due to the fact she has no true mechanical symptoms of the knee would not recommend knee arthroscopy.  However she does have recurrent effusion and this could be due to her patellofemoral arthritis.  Would recommend supplemental injection and aspiration we will gain approval for this and have her back once available.  In regards to her neck fact that she is having no radicular symptoms would recommend formal therapy as she has had no therapy.  She is agreeable with this.  Prescription given for physical therapy for range of motion, home exercise and modalities to the cervical spine.  She will follow-up with [place] once the supplemental injection has been approved.  Questions were encouraged and answered by Dr. Ninfa Linden and myself.  Follow-Up Instructions: Return for Supplemental injection.   Orders:  No orders of the defined types were placed in this encounter.  No orders of the defined types were placed in this encounter.     Procedures: No procedures performed   Clinical Data: No additional findings.   Subjective: Chief Complaint  Patient presents with   Left Knee - Pain    HPI Priscilla [name] comes in today with left knee pain.  She is seen [facility] for back in June at that time had an aspiration and cortisone injection of the knee.  This was after a fall and required an.  She states overall her knee is improving.  She has undergone an MRI of the knee in the interim versus not been reviewed.  I did review the images today and the  pathology with her.  She was found to have tricompartmental arthritis mild medial lateral compartment and moderate patellofemoral compartment.  Oblique tear was seen in the anterior horn of the lateral mid body meniscus.  None fragmented osteochondral lesion was seen in the lateral femoral condyle. She denies any mechanical symptoms of the knee.  Main complaint is whenever she gets down on her knees to play with her grandchildren she has pain.  She is had no other treatment for the knee.  She is diabetic reports good control of her diabetes.  She also has fibromyalgia and psoriatic arthritis  She is having some neck pain without radicular symptoms.  She states most the pain is on the left side of the neck.  She has had no real treatment for this.  She had prior radiographs with Dr. Estanislado Pandy in June 2021.  Personally reviewed these images and shows loss of the normal lordotic curvature.  Narrowing at C6-C7 and with anterior spur formation.  C4-C5 and C5-C6 slight spondylolisthesis grade 1.  Review of Systems See HPI otherwise negative or noncontributory.   Objective: Vital Signs: LMP  (LMP Unknown)   Physical Exam Constitutional:      Appearance: She is normal weight. She is not ill-appearing or diaphoretic.  Pulmonary:     Effort:  Pulmonary effort is normal.  Neurological:     Mental Status: She is alert and oriented to person, place, and time.  Psychiatric:        Mood and Affect: Mood normal.    Ortho Exam Bilateral knees excellent range of motion of both knees.  No gross instability valgus varus stressing of either knee.  No abnormal warmth erythema either knee.  Slight effusion left knee.  No tenderness along medial lateral joint line.  Left knee patellofemoral crepitus.  McMurray's is negative bilaterally. Cervical spine she has good flexion extension but limited rotation to the left. Specialty Comments:  No specialty comments available.  Imaging: No results found.   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   Gastric bypass status for obesity 08/31/2016   Iron deficiency anemia 04/28/2016   Other insomnia 03/14/2016   DDD (degenerative disc disease), cervical 03/14/2016   Spondylosis without myelopathy or radiculopathy, lumbar region 01/31/2016   Anxiety and depression 06/25/2014   Psoriatic arthritis - sees rheumatologist 02/20/2014   Asthma 12/14/2008   Diabetic peripheral neuropathy associated with type 2 diabetes mellitus ([place]) 08/06/2008   Hyperlipemia 11/12/2006   Essential hypertension 11/12/2006   Past Medical History:  Diagnosis Date   Anemia 12/14/2008   Anxiety and depression 12/14/2008   Asthma 11/12/2006   CAD (coronary artery disease) 12/14/2008   hx transluminal coronary angioplasty   Cataract    Depression    Diabetes mellitus with neuropathy ([place]) 12/14/2008   Fibromyalgia 01/04/2007   sees rheuamtology   Gastric bypass status for obesity 08/31/2016   Hyperlipemia 01/21/2010   Hypertension 12/14/2008   Leg edema 12/14/2008   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   OSA (obstructive sleep apnea) 08/06/2008   Osteoarthritis 12/14/2008   Palpitations 11/12/2006   hx sinus tachy   Psoriatic arthritis ([place])    sees rheumatology   Rotator cuff injury     Family History  Problem Relation Age of Onset   Arthritis Mother    Alzheimer's disease Mother    Stroke Father    Alcohol abuse Father    Diabetes Father    Heart disease Father    Early death Father 32   Diabetes Sister    Arthritis Sister    COPD Sister    Arthritis Maternal Aunt    Diabetes Maternal Aunt    Early death Maternal Aunt 50   Diabetes Maternal Uncle    Cancer Paternal Aunt     COPD Paternal Aunt    Heart disease Paternal Aunt    Heart disease Paternal Uncle    Alcohol abuse Paternal Grandfather    Heart disease Paternal Grandfather    Early death Paternal Grandfather 65   Stroke Paternal Grandfather    Hypertension Son    Autoimmune disease Daughter    Breast cancer Neg Hx     Past Surgical History:  Procedure Laterality Date   ABDOMINAL HYSTERECTOMY     CHOLECYSTECTOMY     LAPAROSCOPIC GASTRIC BANDING     Lumbar Facet Joint Intra-Articular Injection(s) with Fluoroscopic Guidance  09/13/2020   L4-5; Dr. Ernestina Patches   PTCA     WRIST SURGERY     LEFT   Social History  Occupational History   Occupation: retired  Tobacco Use   Smoking status: Never   Smokeless tobacco: Never  Vaping Use   Vaping Use: Never used  Substance and Sexual Activity   Alcohol use: Yes    Comment: glass of wine-special occasion   Drug use: Never   Sexual activity: Never    Birth control/protection: None

## 2021-01-05 NOTE — Telephone Encounter (Signed)
Please get auth for left knee inj-monovisc preferred. -blackman pt

## 2021-01-19 ENCOUNTER — Ambulatory Visit: Payer: Medicare Other

## 2021-01-23 ENCOUNTER — Other Ambulatory Visit: Payer: Self-pay | Admitting: Internal Medicine

## 2021-01-27 ENCOUNTER — Telehealth: Payer: Self-pay | Admitting: Physical Medicine and Rehabilitation

## 2021-01-27 NOTE — Telephone Encounter (Signed)
Patient called. She would like an appointment with Dr. Newton.  

## 2021-01-28 ENCOUNTER — Emergency Department (HOSPITAL_BASED_OUTPATIENT_CLINIC_OR_DEPARTMENT_OTHER)
Admission: EM | Admit: 2021-01-28 | Discharge: 2021-01-28 | Disposition: A | Discharge status: Home / Self Care | Payer: No Typology Code available for payment source | Attending: Emergency Medicine | Admitting: Emergency Medicine

## 2021-01-28 ENCOUNTER — Other Ambulatory Visit: Payer: Self-pay

## 2021-01-28 ENCOUNTER — Encounter: Payer: Self-pay | Admitting: Hematology & Oncology

## 2021-01-28 ENCOUNTER — Encounter (HOSPITAL_BASED_OUTPATIENT_CLINIC_OR_DEPARTMENT_OTHER): Payer: Self-pay | Admitting: Obstetrics and Gynecology

## 2021-01-28 ENCOUNTER — Emergency Department (HOSPITAL_BASED_OUTPATIENT_CLINIC_OR_DEPARTMENT_OTHER): Payer: No Typology Code available for payment source

## 2021-01-28 DIAGNOSIS — S199XXA Unspecified injury of neck, initial encounter: Secondary | ICD-10-CM | POA: Diagnosis present

## 2021-01-28 DIAGNOSIS — S161XXA Strain of muscle, fascia and tendon at neck level, initial encounter: Secondary | ICD-10-CM | POA: Diagnosis not present

## 2021-01-28 DIAGNOSIS — R519 Headache, unspecified: Secondary | ICD-10-CM | POA: Insufficient documentation

## 2021-01-28 DIAGNOSIS — I10 Essential (primary) hypertension: Secondary | ICD-10-CM | POA: Diagnosis not present

## 2021-01-28 DIAGNOSIS — G4489 Other headache syndrome: Secondary | ICD-10-CM | POA: Diagnosis not present

## 2021-01-28 DIAGNOSIS — S0990XA Unspecified injury of head, initial encounter: Secondary | ICD-10-CM | POA: Diagnosis not present

## 2021-01-28 DIAGNOSIS — E1140 Type 2 diabetes mellitus with diabetic neuropathy, unspecified: Secondary | ICD-10-CM | POA: Diagnosis not present

## 2021-01-28 DIAGNOSIS — M542 Cervicalgia: Secondary | ICD-10-CM | POA: Diagnosis not present

## 2021-01-28 DIAGNOSIS — Z7982 Long term (current) use of aspirin: Secondary | ICD-10-CM | POA: Diagnosis not present

## 2021-01-28 DIAGNOSIS — J45909 Unspecified asthma, uncomplicated: Secondary | ICD-10-CM | POA: Insufficient documentation

## 2021-01-28 DIAGNOSIS — I2510 Atherosclerotic heart disease of native coronary artery without angina pectoris: Secondary | ICD-10-CM | POA: Diagnosis not present

## 2021-01-28 DIAGNOSIS — Z794 Long term (current) use of insulin: Secondary | ICD-10-CM | POA: Insufficient documentation

## 2021-01-28 DIAGNOSIS — Y92410 Unspecified street and highway as the place of occurrence of the external cause: Secondary | ICD-10-CM | POA: Insufficient documentation

## 2021-01-28 DIAGNOSIS — Z79899 Other long term (current) drug therapy: Secondary | ICD-10-CM | POA: Diagnosis not present

## 2021-01-28 DIAGNOSIS — Z7984 Long term (current) use of oral hypoglycemic drugs: Secondary | ICD-10-CM | POA: Diagnosis not present

## 2021-01-28 MED ORDER — METHOCARBAMOL 500 MG PO TABS: 500.0000 mg | ORAL_TABLET | Freq: Three times a day (TID) | ORAL | 0 refills | Status: DC | PRN
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
## 2021-02-01 ENCOUNTER — Encounter: Payer: Self-pay | Admitting: Physical Medicine and Rehabilitation

## 2021-02-02 ENCOUNTER — Telehealth: Payer: Self-pay

## 2021-02-02 ENCOUNTER — Telehealth: Payer: Self-pay | Admitting: Orthopaedic Surgery

## 2021-02-02 NOTE — Telephone Encounter (Signed)
Called patient left message on voicemail to return call to schedule an appointment with Dr Ninfa Linden or Artis Delay for RT Hip pain   Per Mychart message

## 2021-02-02 NOTE — Telephone Encounter (Signed)
VOB submitted for Monovisc, left knee. Pending BV.

## 2021-02-04 ENCOUNTER — Other Ambulatory Visit: Payer: Self-pay | Admitting: Rheumatology

## 2021-02-04 NOTE — Telephone Encounter (Signed)
Next Visit: 05/02/2021   Last Visit: 11/30/2020   Last Fill: 12/31/2020   Dx: Other insomnia    Current Dose per office note on 11/30/2020: Ambien 5 mg 1 tablet by mouth at bedtime for insomnia.   Okay to refill Ambien?

## 2021-02-04 NOTE — Telephone Encounter (Signed)
See my chart message

## 2021-02-07 ENCOUNTER — Encounter: Payer: Self-pay | Admitting: Hematology & Oncology

## 2021-02-08 ENCOUNTER — Telehealth: Payer: Self-pay

## 2021-02-08 NOTE — Telephone Encounter (Signed)
Approved for Monovisc, left knee. [place] deductible has been met Secondary insurance will pick up remaining eligible expenses at 100%. No Co-pay No PA required  Appt. 02/15/2021

## 2021-02-15 ENCOUNTER — Ambulatory Visit (INDEPENDENT_AMBULATORY_CARE_PROVIDER_SITE_OTHER): Payer: Medicare Other

## 2021-02-15 ENCOUNTER — Other Ambulatory Visit: Payer: Self-pay

## 2021-02-15 ENCOUNTER — Other Ambulatory Visit: Payer: Self-pay | Admitting: Orthopaedic Surgery

## 2021-02-15 ENCOUNTER — Ambulatory Visit (INDEPENDENT_AMBULATORY_CARE_PROVIDER_SITE_OTHER): Payer: Medicare Other | Admitting: Orthopaedic Surgery

## 2021-02-15 ENCOUNTER — Encounter: Payer: Self-pay | Admitting: Orthopaedic Surgery

## 2021-02-15 DIAGNOSIS — M25551 Pain in right hip: Secondary | ICD-10-CM | POA: Diagnosis not present

## 2021-02-15 DIAGNOSIS — M542 Cervicalgia: Secondary | ICD-10-CM

## 2021-02-15 MED ORDER — MELOXICAM 15 MG PO TABS: 15.0000 mg | ORAL_TABLET | Freq: Every day | ORAL | 0 refills | Status: DC

## 2021-02-15 MED ORDER — TIZANIDINE HCL 4 MG PO TABS: 4.0000 mg | ORAL_TABLET | Freq: Three times a day (TID) | ORAL | 1 refills | Status: DC | PRN
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
## 2021-02-23 ENCOUNTER — Ambulatory Visit: Payer: Medicare Other

## 2021-02-23 ENCOUNTER — Ambulatory Visit (INDEPENDENT_AMBULATORY_CARE_PROVIDER_SITE_OTHER): Payer: Medicare Other

## 2021-02-23 ENCOUNTER — Other Ambulatory Visit: Payer: Self-pay

## 2021-02-23 DIAGNOSIS — Z0000 Encounter for general adult medical examination without abnormal findings: Secondary | ICD-10-CM | POA: Diagnosis not present

## 2021-02-23 NOTE — Patient Instructions (Signed)
Priscilla [name] , Thank you for taking time to come for your Medicare Wellness Visit. I appreciate your ongoing commitment to your health goals. Please review the following plan we discussed and let me know if I can assist you in the future.   Screening recommendations/referrals: Colonoscopy: Cologuard 12/30/18 repeat every 3 years Mammogram: Done 11/25/20 repeat every year  Bone Density: Done 11/25/20 repeat every 2 years  Recommended yearly ophthalmology/optometry visit for glaucoma screening and checkup Recommended yearly dental visit for hygiene and checkup  Vaccinations: Influenza vaccine: due and discussed Pneumococcal vaccine: Up to date Tdap vaccine: Done 02/07/13 Shingles vaccine: Shingrix discussed. Please contact your pharmacy for coverage information.    Covid-19:Completed 2/13, 3/13, & 12/22/19  Advanced directives: Please bring a copy of your health care power of attorney and living will to the office at your convenience.  Conditions/risks identified: Stay active   Next appointment: Follow up in one year for your annual wellness visit    Preventive Care 71 Years and Older, Female Preventive care refers to lifestyle choices and visits with your health care provider that can promote health and wellness. What does preventive care include? A yearly physical exam. This is also called an annual well check. Dental exams once or twice a year. Routine eye exams. Ask your health care provider how often you should have your eyes checked. Personal lifestyle choices, including: Daily care of your teeth and gums. Regular physical activity. Eating a healthy diet. Avoiding tobacco and drug use. Limiting alcohol use. Practicing safe sex. Taking low-dose aspirin every day. Taking vitamin and mineral supplements as recommended by your health care provider. What happens during an annual well check? The services and screenings done by your health care provider during your annual well check  will depend on your age, overall health, lifestyle risk factors, and family history of disease. Counseling  Your health care provider may ask you questions about your: Alcohol use. Tobacco use. Drug use. Emotional well-being. Home and relationship well-being. Sexual activity. Eating habits. History of falls. Memory and ability to understand (cognition). Work and work Statistician. Reproductive health. Screening  You may have the following tests or measurements: Height, weight, and BMI. Blood pressure. Lipid and cholesterol levels. These may be checked every 5 years, or more frequently if you are over 62 years old. Skin check. Lung cancer screening. You may have this screening every year starting at age 71 if you have a 30-pack-year history of smoking and currently smoke or have quit within the past 15 years. Fecal occult blood test (FOBT) of the stool. You may have this test every year starting at age 71. Flexible sigmoidoscopy or colonoscopy. You may have a sigmoidoscopy every 5 years or a colonoscopy every 10 years starting at age 71. Hepatitis C blood test. Hepatitis B blood test. Sexually transmitted disease (STD) testing. Diabetes screening. This is done by checking your blood sugar (glucose) after you have not eaten for a while (fasting). You may have this done every 1-3 years. Bone density scan. This is done to screen for osteoporosis. You may have this done starting at age 71. Mammogram. This may be done every 1-2 years. Talk to your health care provider about how often you should have regular mammograms. Talk with your health care provider about your test results, treatment options, and if necessary, the need for more tests. Vaccines  Your health care provider may recommend certain vaccines, such as: Influenza vaccine. This is recommended every year. Tetanus, diphtheria, and acellular pertussis (Tdap,  Td) vaccine. You may need a Td booster every 10 years. Zoster vaccine. You  may need this after age 4. Pneumococcal 13-valent conjugate (PCV13) vaccine. One dose is recommended after age 71. Pneumococcal polysaccharide (PPSV23) vaccine. One dose is recommended after age 50. Talk to your health care provider about which screenings and vaccines you need and how often you need them. This information is not intended to replace advice given to you by your health care provider. Make sure you discuss any questions you have with your health care provider. Document Released: 04/23/2015 Document Revised: 12/15/2015 Document Reviewed: 01/26/2015 Elsevier Interactive Patient Education  2017 [employer] Prevention in the Home Falls can cause injuries. They can happen to people of all ages. There are many things you can do to make your home safe and to help prevent falls. What can I do on the outside of my home? Regularly fix the edges of walkways and driveways and fix any cracks. Remove anything that might make you trip as you walk through a door, such as a raised step or threshold. Trim any bushes or trees on the path to your home. Use bright outdoor lighting. Clear any walking paths of anything that might make someone trip, such as rocks or tools. Regularly check to see if handrails are loose or broken. Make sure that both sides of any steps have handrails. Any raised decks and porches should have guardrails on the edges. Have any leaves, snow, or ice cleared regularly. Use sand or salt on walking paths during winter. Clean up any spills in your garage right away. This includes oil or grease spills. What can I do in the bathroom? Use night lights. Install grab bars by the toilet and in the tub and shower. Do not use towel bars as grab bars. Use non-skid mats or decals in the tub or shower. If you need to sit down in the shower, use a plastic, non-slip stool. Keep the floor dry. Clean up any water that spills on the floor as soon as it happens. Remove soap buildup  in the tub or shower regularly. Attach bath mats securely with double-sided non-slip rug tape. Do not have throw rugs and other things on the floor that can make you trip. What can I do in the bedroom? Use night lights. Make sure that you have a light by your bed that is easy to reach. Do not use any sheets or blankets that are too big for your bed. They should not hang down onto the floor. Have a firm chair that has side arms. You can use this for support while you get dressed. Do not have throw rugs and other things on the floor that can make you trip. What can I do in the kitchen? Clean up any spills right away. Avoid walking on wet floors. Keep items that you use a lot in easy-to-reach places. If you need to reach something above you, use a strong step stool that has a grab bar. Keep electrical cords out of the way. Do not use floor polish or wax that makes floors slippery. If you must use wax, use non-skid floor wax. Do not have throw rugs and other things on the floor that can make you trip. What can I do with my stairs? Do not leave any items on the stairs. Make sure that there are handrails on both sides of the stairs and use them. Fix handrails that are broken or loose. Make sure that handrails are as  long as the stairways. Check any carpeting to make sure that it is firmly attached to the stairs. Fix any carpet that is loose or worn. Avoid having throw rugs at the top or bottom of the stairs. If you do have throw rugs, attach them to the floor with carpet tape. Make sure that you have a light switch at the top of the stairs and the bottom of the stairs. If you do not have them, ask someone to add them for you. What else can I do to help prevent falls? Wear shoes that: Do not have high heels. Have rubber bottoms. Are comfortable and fit you well. Are closed at the toe. Do not wear sandals. If you use a stepladder: Make sure that it is fully opened. Do not climb a closed  stepladder. Make sure that both sides of the stepladder are locked into place. Ask someone to hold it for you, if possible. Clearly mark and make sure that you can see: Any grab bars or handrails. First and last steps. Where the edge of each step is. Use tools that help you move around (mobility aids) if they are needed. These include: Canes. Walkers. Scooters. Crutches. Turn on the lights when you go into a dark area. Replace any light bulbs as soon as they burn out. Set up your furniture so you have a clear path. Avoid moving your furniture around. If any of your floors are uneven, fix them. If there are any pets around you, be aware of where they are. Review your medicines with your doctor. Some medicines can make you feel dizzy. This can increase your chance of falling. Ask your doctor what other things that you can do to help prevent falls. This information is not intended to replace advice given to you by your health care provider. Make sure you discuss any questions you have with your health care provider. Document Released: 01/21/2009 Document Revised: 09/02/2015 Document Reviewed: 05/01/2014 Elsevier Interactive Patient Education  2017 [employer].

## 2021-02-23 NOTE — Progress Notes (Signed)
Virtual Visit via Telephone Note  I connected with  Priscilla [name] on 02/23/21 at  8:00 AM EST by telephone and verified that I am speaking with the correct person using two identifiers.  Medicare Annual Wellness visit completed telephonically due to Covid-19 pandemic.   Persons participating in this call: This Health Coach and this patient.   Location: Patient: Home Provider: Office   I discussed the limitations, risks, security and privacy concerns of performing an evaluation and management service by telephone and the availability of in person appointments. The patient expressed understanding and agreed to proceed.  Unable to perform video visit due to video visit attempted and failed and/or patient does not have video capability.   Some vital signs may be absent or patient reported.   Willette Brace, LPN   Subjective:   Priscilla [name] is a 70 y.o. female who presents for Medicare Annual (Subsequent) preventive examination.  Review of Systems     Cardiac Risk Factors include: advanced age (>5men, >14 women);hypertension;diabetes mellitus;dyslipidemia     Objective:    Today's Vitals   02/23/21 0806  PainSc: 5    There is no height or weight on file to calculate BMI.  Advanced Directives 02/23/2021 01/28/2021 12/03/2020 06/03/2020 01/14/2020 11/19/2019 12/03/2018  Does Patient Have a Medical Advance Directive? Yes No No No No No No  Type of Advance Directive - - - - - - -  Does patient want to make changes to medical advance directive? Yes (MAU/Ambulatory/Procedural Areas - Information given) - - - - - -  Copy of Healthcare Power of Attorney in Chart? - - - - - - -  Would patient like information on creating a medical advance directive? - No - Patient declined - No - Patient declined Yes (MAU/Ambulatory/Procedural Areas - Information given) No - Patient declined No - Patient declined    Current Medications (verified) Outpatient Encounter Medications as of  02/23/2021  Medication Sig   albuterol (PROVENTIL HFA;VENTOLIN HFA) 108 (90 Base) MCG/ACT inhaler Inhale 2 puffs into the lungs every 6 (six) hours as needed for wheezing or shortness of breath.   aspirin 81 MG tablet Take 81 mg by mouth daily.   calcium-vitamin D 250-100 MG-UNIT per tablet Take 1 tablet by mouth daily.    COLLAGEN PO Take by mouth. Adds to tea daily   Continuous Blood Gluc Sensor (FREESTYLE LIBRE 2 SENSOR) MISC Use sensors with CGM to check sugars.   Cyanocobalamin (VITAMIN B12 PO) Place under the tongue.   DULoxetine (CYMBALTA) 60 MG capsule Take 1 capsule (60 mg total) by mouth daily.   glucose blood (ONETOUCH VERIO) test strip USE TO TEST BLOOD SUGAR TWICE A DAY DX: E11.319   insulin detemir (LEVEMIR) 100 UNIT/ML injection Inject 0.13-0.15 mLs (13-15 Units total) into the skin at bedtime.   Insulin Syringe-Needle U-100 (B-D INS SYR MICROFINE 1CC/27G) 27G X 5/8" 1 ML MISC Use to inject levemir once daily DX: E11.319   meloxicam (MOBIC) 15 MG tablet TAKE 1 TABLET(15 MG) BY MOUTH DAILY   metFORMIN (GLUCOPHAGE) 1000 MG tablet TAKE 1 TABLET BY MOUTH TWICE DAILY WITH MEALS   metoprolol tartrate (LOPRESSOR) 25 MG tablet TAKE 1 TABLET(25 MG) BY MOUTH TWICE DAILY   NOVOLOG FLEXPEN 100 UNIT/ML FlexPen Inject 2-4 Units into the skin 3 (three) times daily with meals.   pravastatin (PRAVACHOL) 40 MG tablet Take 1 tablet (40 mg total) by mouth daily.   sertraline (ZOLOFT) 100 MG tablet Take 1 tablet (100 mg  total) by mouth daily.   Zinc Sulfate (ZINC 15 PO) Take by mouth.   zolpidem (AMBIEN) 5 MG tablet TAKE 1 TABLET(5 MG) BY MOUTH AT BEDTIME AS NEEDED FOR SLEEP   Collagen-Vitamin C-Biotin (COLLAGEN 1500/C PO) Take by mouth. (Patient not taking: Reported on 02/23/2021)   methocarbamol (ROBAXIN) 500 MG tablet Take 1 tablet (500 mg total) by mouth every 8 (eight) hours as needed for muscle spasms. (Patient not taking: Reported on 02/23/2021)   tiZANidine (ZANAFLEX) 4 MG tablet Take 1  tablet (4 mg total) by mouth every 8 (eight) hours as needed for muscle spasms. (Patient not taking: Reported on 02/23/2021)   Vitamin D, Ergocalciferol, (DRISDOL) 1.25 MG (50000 UNIT) CAPS capsule TAKE 1 CAPSULE BY MOUTH EVERY 7 DAYS (Patient not taking: Reported on 02/23/2021)   [DISCONTINUED] Biotin 5000 MCG TABS Take 5,000 mcg by mouth daily.    [DISCONTINUED] lisinopril (ZESTRIL) 2.5 MG tablet Take 2.5 mg by mouth daily. (Patient not taking: Reported on 02/23/2021)   [DISCONTINUED] Multiple Vitamin (MULTIVITAMIN) tablet Take 1 tablet by mouth daily.  (Patient not taking: Reported on 02/23/2021)   [DISCONTINUED] Multiple Vitamins-Minerals (ZINC PO) Take by mouth daily. (Patient not taking: Reported on 02/23/2021)   No facility-administered encounter medications on file as of 02/23/2021.    Allergies (verified) Patient has no known allergies.   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 Ernestina Patches   PTCA     WRIST SURGERY     LEFT   Family History  Problem Relation Age of Onset   Arthritis Mother    Alzheimer's disease Mother    Stroke  Father    Alcohol abuse Father    Diabetes Father    Heart disease Father    Early death Father 60   Diabetes Sister    Arthritis Sister    COPD Sister    Arthritis Maternal Aunt    Diabetes Maternal Aunt    Early death Maternal Aunt 50   Diabetes Maternal Uncle    Cancer Paternal Aunt    COPD Paternal Aunt    Heart disease Paternal Aunt    Heart disease Paternal Uncle    Alcohol abuse Paternal Grandfather    Heart disease Paternal Grandfather    Early death Paternal Grandfather 69   Stroke Paternal Grandfather    Hypertension Son    Autoimmune disease Daughter    Breast cancer Neg Hx    Social History   Socioeconomic History   Marital status: Married    Spouse name: Not on file   Number of children: Not on file  Years of education: Not on file   Highest education level: Not on file  Occupational History   Occupation: retired  Tobacco Use   Smoking status: Never   Smokeless tobacco: Never  Vaping Use   Vaping Use: Never used  Substance and Sexual Activity   Alcohol use: Yes    Comment: glass of wine-special occasion   Drug use: Never   Sexual activity: Never    Birth control/protection: None  Other Topics Concern   Not on file  Social History Narrative   Married, [name]. 2 children name Abe People and Anderson Malta.   Some college, retired Engineer, manufacturing.   Drinks caffeine.   Take a daily vitamin.   Wears her seatbelt, exercises 3 times a week.   Smoke detector in the home.   Firearms in the home.   Feels safe in her relationships.   Social Determinants of Health   Financial Resource Strain: Low Risk    Difficulty of Paying Living Expenses: Not hard at all  Food Insecurity: No Food Insecurity   Worried About Charity fundraiser in the Last Year: Never true   [place] in the Last Year: Never true  Transportation Needs: No Transportation Needs   Lack of Transportation (Medical): No   Lack of Transportation (Non-Medical): No  Physical  Activity: Inactive   Days of Exercise per Week: 0 days   Minutes of Exercise per Session: 0 min  Stress: No Stress Concern Present   Feeling of Stress : Only a little  Social Connections: Moderately Integrated   Frequency of Communication with Friends and Family: More than three times a week   Frequency of Social Gatherings with Friends and Family: More than three times a week   Attends Religious Services: 1 to 4 times per year   Active Member of [employer] or Organizations: No   Attends Music therapist: Never   Marital Status: Married    Tobacco Counseling Counseling given: Not Answered   Clinical Intake:  Pre-visit preparation completed: Yes  Pain : 0-10 Pain Score: 5  Pain Type: Acute pain Pain Location: Hip (neck pain) Pain Orientation: Right Pain Descriptors / Indicators: Sore Pain Onset: 1 to 4 weeks ago Pain Frequency: Intermittent     BMI - recorded: 28 Nutritional Status: BMI 25 -29 Overweight Nutritional Risks: None Diabetes: Yes CBG done?: Yes (105) CBG resulted in Enter/ Edit results?: No Did pt. bring in CBG monitor from home?: No  How often do you need to have someone help you when you read instructions, pamphlets, or other written materials from your doctor or pharmacy?: 1 - Never  Diabetic?Nutrition Risk Assessment:  Has the patient had any N/V/D within the last 2 months?  No  Does the patient have any non-healing wounds?  No  Has the patient had any unintentional weight loss or weight gain?  No   Diabetes:  Is the patient diabetic?  Yes  If diabetic, was a CBG obtained today?  Yes  Did the patient bring in their glucometer from home?  No  How often do you monitor your CBG's? Daily.   Financial Strains and Diabetes Management:  Are you having any financial strains with the device, your supplies or your medication? No .  Does the patient want to be seen by Chronic Care Management for management of their diabetes?  No  Would the  patient like to be referred to a Nutritionist or for Diabetic Management?  No   Diabetic Exams:  Diabetic Eye Exam: Completed 10/15/20 Diabetic Foot Exam: Completed 10/15/20   Interpreter Needed?: No  Information entered by :: Charlott Rakes, LPN   Activities of Daily Living In your present state of health, do you have any difficulty performing the following activities: 02/23/2021  Hearing? N  Vision? N  Difficulty concentrating or making decisions? N  Walking or climbing stairs? N  Dressing or bathing? N  Doing errands, shopping? N  Preparing Food and eating ? N  Using the Toilet? N  In the past six months, have you accidently leaked urine? Y  Comment wears a pad  Do you have problems with loss of bowel control? N  Managing your Medications? N  Managing your Finances? N  Housekeeping or managing your Housekeeping? N  Some recent data might be hidden    Patient Care Team: Ma Hillock, DO as PCP - General (Family Medicine) Marlou Sa Tonna Corner, MD as Consulting Physician (Orthopedic Surgery) Philemon Kingdom, MD as Consulting Physician (Internal Medicine) Mcarthur Rossetti, MD as Consulting Physician (Orthopedic Surgery) Bo Merino, MD as Consulting Physician (Rheumatology) Volanda Napoleon, MD as Consulting Physician (Oncology) Magnus Sinning, MD as Consulting Physician (Physical Medicine and Rehabilitation) Dermatology, [place], [place] T, [place] as Consulting Physician (Podiatry)  Indicate any recent [place] you may have received from other than [facility] providers in the past year (date may be approximate).     Assessment:   This is a routine wellness examination for [place].  Hearing/Vision screen Hearing Screening - Comments:: Pt denies any hearing issues  Vision Screening - Comments:: Pt follows up with [facility] for annual eye exams   Dietary issues and exercise activities discussed: Current Exercise Habits: The patient does not  participate in regular exercise at present   Goals Addressed             This Visit's Progress    Patient Stated       Stay active       Depression Screen PHQ 2/9 Scores 02/23/2021 10/15/2020 01/14/2020 09/23/2019 03/14/2019 12/03/2018 08/13/2018  PHQ - 2 Score 1 2 3 3  0 0 0  PHQ- 9 Score - 8 5 9 1  - 1    Fall Risk Fall Risk  02/23/2021 01/14/2020 12/03/2018 11/27/2017 01/08/2017  Falls in the past year? 1 0 0 No No  Comment - - - - -  Number falls in past yr: 1 0 0 - -  Comment - - - - -  Injury with Fall? 1 0 0 - -  Comment left knee - - - -  Risk for fall due to : Impaired vision;Impaired balance/gait;Impaired mobility - - - -  Follow up Falls prevention discussed Falls prevention discussed Falls prevention discussed - -    FALL RISK PREVENTION PERTAINING TO THE HOME:  Any stairs in or around the home? Yes  If so, are there any without handrails? No  Home free of loose throw rugs in walkways, pet beds, electrical cords, etc? Yes  Adequate lighting in your home to reduce risk of falls? Yes   ASSISTIVE DEVICES UTILIZED TO PREVENT FALLS:  Life alert? No  Use of a cane, walker or w/c? Yes  Grab bars in the bathroom? Yes  Shower chair or bench in shower? No  Elevated toilet seat or a handicapped toilet? No   TIMED UP AND GO:  Was the test performed? No .   Cognitive Function: MMSE - Mini Mental State Exam 12/03/2018 11/27/2017  Orientation to time  5 5  Orientation to Place 5 5  Registration 3 3  Attention/ Calculation 5 5  Recall 3 3  Language- name 2 objects 2 2  Language- repeat 1 1  Language- follow 3 step command 3 3  Language- read & follow direction 1 1  Write a sentence 1 1  Copy design 1 1  Total score 30 30     6CIT Screen 02/23/2021  What Year? 0 points  What month? 0 points  What time? 0 points  Count back from 20 0 points  Months in reverse 0 points  Repeat phrase 0 points  Total Score 0    Immunizations Immunization History  Administered  Date(s) Administered   Fluad Quad(high Dose 65+) 12/03/2018, 01/29/2020   Influenza Split 01/21/2013   Influenza, High Dose Seasonal PF 02/04/2015, 02/09/2016, 02/11/2018   Influenza,inj,Quad PF,6+ Mos 01/15/2014   Influenza-Unspecified 01/26/2017   Moderna Sars-Covid-2 Vaccination 05/24/2019, 06/21/2019, 12/22/2019   PPD Test 11/15/2020   Pneumococcal Conjugate-13 02/04/2015   Pneumococcal Polysaccharide-23 10/31/2013, 01/29/2020   Tdap 02/07/2013    TDAP status: Up to date  Flu Vaccine status: Due, Education has been provided regarding the importance of this vaccine. Advised may receive this vaccine at local pharmacy or Health Dept. Aware to provide a copy of the vaccination record if obtained from local pharmacy or Health Dept. Verbalized acceptance and understanding.  Pneumococcal vaccine status: Up to date  Covid-19 vaccine status: Completed vaccines  Qualifies for Shingles Vaccine? Yes   Zostavax completed No   Shingrix Completed?: No.    Education has been provided regarding the importance of this vaccine. Patient has been advised to call insurance company to determine out of pocket expense if they have not yet received this vaccine. Advised may also receive vaccine at local pharmacy or Health Dept. Verbalized acceptance and understanding.  Screening Tests Health Maintenance  Topic Date Due   Zoster Vaccines- Shingrix (1 of 2) Never done   COVID-19 Vaccine (4 - Booster for Moderna series) 02/16/2020   INFLUENZA VACCINE  11/08/2020   HEMOGLOBIN A1C  05/01/2021   FOOT EXAM  10/15/2021   OPHTHALMOLOGY EXAM  10/15/2021   Fecal DNA (Cologuard)  12/29/2021   MAMMOGRAM  11/26/2022   TETANUS/TDAP  02/08/2023   DEXA SCAN  11/26/2030   Pneumonia Vaccine 50+ Years old  Completed   Hepatitis C Screening  Completed   HPV VACCINES  Aged Out    Health Maintenance  Health Maintenance Due  Topic Date Due   Zoster Vaccines- Shingrix (1 of 2) Never done   COVID-19 Vaccine (4 -  Booster for Moderna series) 02/16/2020   INFLUENZA VACCINE  11/08/2020    Colorectal cancer screening: Type of screening: Cologuard. Completed 12/30/18. Repeat every 3 years  Mammogram status: Completed 11/25/20. Repeat every year  Bone Density status: Completed 11/25/20. Results reflect: Bone density results: NORMAL. Repeat every 2 years.   Additional Screening:  Hepatitis C Screening: Completed 08/25/15  Vision Screening: Recommended annual ophthalmology exams for early detection of glaucoma and other disorders of the eye. Is the patient up to date with their annual eye exam?  Yes  Who is the provider or what is the name of the office in which the patient attends annual eye exams? [facility]  If pt is not established with a provider, would they like to be referred to a provider to establish care? No .   Dental Screening: Recommended annual dental exams for proper oral hygiene  Community Resource Referral /  Chronic Care Management: CRR required this visit?  No   CCM required this visit?  No      Plan:     I have personally reviewed and noted the following in the patient's chart:   Medical and social history Use of alcohol, tobacco or illicit drugs  Current medications and supplements including opioid prescriptions.  Functional ability and status Nutritional status Physical activity Advanced directives List of other physicians Hospitalizations, surgeries, and ER visits in previous 12 months Vitals Screenings to include cognitive, depression, and falls Referrals and appointments  In addition, I have reviewed and discussed with patient certain preventive protocols, quality metrics, and best practice recommendations. A written personalized care plan for preventive services as well as general preventive health recommendations were provided to patient.     Willette Brace, LPN   [id number]   Nurse Notes: None

## 2021-03-07 ENCOUNTER — Encounter: Payer: Self-pay | Admitting: Internal Medicine

## 2021-03-07 ENCOUNTER — Other Ambulatory Visit: Payer: Self-pay

## 2021-03-07 ENCOUNTER — Other Ambulatory Visit: Payer: Self-pay | Admitting: Rheumatology

## 2021-03-07 ENCOUNTER — Ambulatory Visit (INDEPENDENT_AMBULATORY_CARE_PROVIDER_SITE_OTHER): Payer: Medicare Other | Admitting: Internal Medicine

## 2021-03-07 VITALS — BP 120/78 | HR 66 | Ht 63.0 in | Wt 157.8 lb

## 2021-03-07 DIAGNOSIS — E1142 Type 2 diabetes mellitus with diabetic polyneuropathy: Secondary | ICD-10-CM

## 2021-03-07 DIAGNOSIS — E785 Hyperlipidemia, unspecified: Secondary | ICD-10-CM | POA: Diagnosis not present

## 2021-03-07 DIAGNOSIS — Z794 Long term (current) use of insulin: Secondary | ICD-10-CM | POA: Diagnosis not present

## 2021-03-07 DIAGNOSIS — E11319 Type 2 diabetes mellitus with unspecified diabetic retinopathy without macular edema: Secondary | ICD-10-CM

## 2021-03-07 LAB — POCT GLYCOSYLATED HEMOGLOBIN (HGB A1C): Hemoglobin A1C: 6.6 % — AB (ref 4.0–5.6)

## 2021-03-07 MED ORDER — "INSULIN SYRINGE-NEEDLE U-100 31G X 5/16"" 0.5 ML MISC": 3 refills | Status: AC

## 2021-03-07 MED ORDER — INSULIN DETEMIR 100 UNIT/ML ~~LOC~~ SOLN: 10.0000 [IU] | Freq: Every day | SUBCUTANEOUS | 3 refills | Status: DC

## 2021-03-07 MED ORDER — INSULIN PEN NEEDLE 32G X 4 MM MISC: 3 refills | Status: DC

## 2021-03-07 NOTE — Patient Instructions (Addendum)
Please continue: - Metformin 1000 mg 2x a day with meals - Novolog (3) 4-5 units 15 minutes before meals  Please reduce: - Levemir 10 units at bedtime  Please come back for a follow-up appointment in 4 months.
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
## 2021-03-08 ENCOUNTER — Telehealth: Payer: Self-pay

## 2021-03-08 NOTE — Telephone Encounter (Signed)
Patient husband (DPR) calling for prescription for Tamiflu for his wife. Patient was seen today by Dr. Raoul Pitch, he states he has TYPE A Flu. He states that the patient is "much sicker" than he is.  She has a lot of medical conditions and he sates she needs a Tamiflu.  He stated that he spoke to Dr. Raoul Pitch about this when he was here for his appt, and stated provider will treat with Tamilflu.  I told patient patient would need an appt, patient said he had already spoke to Dr. Raoul Pitch about this.  Please advise.

## 2021-03-08 NOTE — Telephone Encounter (Signed)
Spoke with pt husband regarding medication request. He was informed that he was not dx with flu today during his appt. But if pt will like to be test and evaluated we will need to get her scheduled for an appt.

## 2021-03-08 NOTE — Telephone Encounter (Signed)
Next Visit: 05/02/2021   Last Visit: 11/30/2020   Last Fill: 9/23/202210/28/2022 Dx: Other insomnia    Current Dose per office note on 11/30/2020: Ambien 5 mg 1 tablet by mouth at bedtime for insomnia.   Okay to refill Ambien?

## 2021-03-10 ENCOUNTER — Telehealth: Payer: Self-pay | Admitting: Physical Medicine and Rehabilitation

## 2021-03-10 NOTE — Telephone Encounter (Signed)
IC patient. She will keep appt for now and if she is not feeling better by the 10th of Dec she will call and reschedule.

## 2021-03-10 NOTE — Telephone Encounter (Signed)
Pt called stating she has the flu and needs to r/s her 03/14/21 appt.   [phone]

## 2021-03-14 ENCOUNTER — Ambulatory Visit: Payer: Medicare Other | Admitting: Physical Medicine and Rehabilitation

## 2021-03-15 ENCOUNTER — Emergency Department (HOSPITAL_BASED_OUTPATIENT_CLINIC_OR_DEPARTMENT_OTHER)
Admission: EM | Admit: 2021-03-15 | Discharge: 2021-03-15 | Disposition: A | Discharge status: Home / Self Care | Payer: Medicare Other | Attending: Emergency Medicine | Admitting: Emergency Medicine

## 2021-03-15 ENCOUNTER — Telehealth: Payer: Medicare Other | Admitting: Physician Assistant

## 2021-03-15 ENCOUNTER — Encounter (HOSPITAL_BASED_OUTPATIENT_CLINIC_OR_DEPARTMENT_OTHER): Payer: Self-pay | Admitting: Emergency Medicine

## 2021-03-15 ENCOUNTER — Emergency Department (HOSPITAL_BASED_OUTPATIENT_CLINIC_OR_DEPARTMENT_OTHER): Payer: Medicare Other | Admitting: Radiology

## 2021-03-15 ENCOUNTER — Other Ambulatory Visit: Payer: Self-pay

## 2021-03-15 DIAGNOSIS — Z7982 Long term (current) use of aspirin: Secondary | ICD-10-CM | POA: Insufficient documentation

## 2021-03-15 DIAGNOSIS — Z20822 Contact with and (suspected) exposure to covid-19: Secondary | ICD-10-CM | POA: Diagnosis not present

## 2021-03-15 DIAGNOSIS — R42 Dizziness and giddiness: Secondary | ICD-10-CM

## 2021-03-15 DIAGNOSIS — R059 Cough, unspecified: Secondary | ICD-10-CM | POA: Diagnosis not present

## 2021-03-15 DIAGNOSIS — I10 Essential (primary) hypertension: Secondary | ICD-10-CM | POA: Diagnosis not present

## 2021-03-15 DIAGNOSIS — J45909 Unspecified asthma, uncomplicated: Secondary | ICD-10-CM | POA: Diagnosis not present

## 2021-03-15 DIAGNOSIS — I2510 Atherosclerotic heart disease of native coronary artery without angina pectoris: Secondary | ICD-10-CM | POA: Insufficient documentation

## 2021-03-15 DIAGNOSIS — E11319 Type 2 diabetes mellitus with unspecified diabetic retinopathy without macular edema: Secondary | ICD-10-CM | POA: Diagnosis not present

## 2021-03-15 DIAGNOSIS — Z79899 Other long term (current) drug therapy: Secondary | ICD-10-CM | POA: Diagnosis not present

## 2021-03-15 DIAGNOSIS — Z7984 Long term (current) use of oral hypoglycemic drugs: Secondary | ICD-10-CM | POA: Insufficient documentation

## 2021-03-15 DIAGNOSIS — E1140 Type 2 diabetes mellitus with diabetic neuropathy, unspecified: Secondary | ICD-10-CM | POA: Insufficient documentation

## 2021-03-15 DIAGNOSIS — J101 Influenza due to other identified influenza virus with other respiratory manifestations: Secondary | ICD-10-CM | POA: Diagnosis not present

## 2021-03-15 DIAGNOSIS — Z794 Long term (current) use of insulin: Secondary | ICD-10-CM | POA: Diagnosis not present

## 2021-03-15 DIAGNOSIS — R0602 Shortness of breath: Secondary | ICD-10-CM

## 2021-03-15 LAB — RESP PANEL BY RT-PCR (FLU A&B, COVID) ARPGX2
Influenza A by PCR: POSITIVE — AB
Influenza B by PCR: NEGATIVE
SARS Coronavirus 2 by RT PCR: NEGATIVE

## 2021-03-15 NOTE — ED Notes (Signed)
HR 99-105 and O2 96-97% on RA w/ ambulation

## 2021-03-15 NOTE — Discharge Instructions (Addendum)
You tested positive for influenza A on todays visit.   You may continue to treat your symptoms with over the counter medication such as robitussin, deslym or mucinex.  The xray of your chest did not show any pneumonia today.   Follow up with your primary care physician as needed.

## 2021-03-15 NOTE — ED Triage Notes (Signed)
Feeling bad x 7 days cough and aching states that she and husband saw their PCP and was neg for flue and covid still not feeling better they states

## 2021-03-15 NOTE — ED Provider Notes (Signed)
[facility] EMERGENCY DEPT Provider Note   CSN: [id number] Arrival date & time: 03/15/21  1413     History Chief Complaint  Patient presents with   Cough    Priscilla [name] is a 70 y.o. female.  70 y.o female with a PMH of Asthma, CAD, presents to the ED with a chief complaint of cough, body aches for the past 7 days.  Patient reports she saw her PCP recently, had all negative work-up for influenza along with COVID.  She reports husband began with the symptoms.  She continues to have fatigue, feeling overall weak.  She has tried Mucinex over-the-counter without much improvement in her symptoms.  No exacerbated symptoms.  No chest pain, no shortness of breath, no fever.  The history is provided by the patient and the spouse.  Cough Cough characteristics:  Dry Associated symptoms: rhinorrhea   Associated symptoms: no chest pain, no chills, no fever and no sore throat       Past Medical History:  Diagnosis Date   Anemia 12/14/2008   Anxiety and depression 12/14/2008   Asthma 11/12/2006   CAD (coronary artery disease) 12/14/2008   hx transluminal coronary angioplasty   Cataract    Depression    Diabetes mellitus with neuropathy ([place]) 12/14/2008   Fibromyalgia 01/04/2007   sees rheuamtology   Gastric bypass status for obesity 08/31/2016   Hyperlipemia 01/21/2010   Hypertension 12/14/2008   Leg edema 12/14/2008   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   OSA (obstructive sleep apnea) 08/06/2008   Osteoarthritis 12/14/2008   Palpitations 11/12/2006   hx sinus tachy   Psoriatic arthritis [facility])    sees rheumatology   Rotator cuff injury     Patient Active Problem List   Diagnosis Date Noted   Decreased circulation right great toe 05/28/2020   Hip pain 11/27/2019   Type 2 diabetes mellitus with retinopathy, with long-term current use of insulin ([place]) 05/17/2018   Overweight (BMI 25.0-29.9) 02/11/2018   Vitamin D deficiency 10/13/2016   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   Gastric  bypass status for obesity 08/31/2016   Iron deficiency anemia 04/28/2016   Other insomnia 03/14/2016   DDD (degenerative disc disease), cervical 03/14/2016   Spondylosis without myelopathy or radiculopathy, 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 Ernestina Patches   PTCA     WRIST SURGERY     LEFT     OB History   No obstetric history on file.     Family History  Problem Relation Age of Onset   Arthritis Mother    Alzheimer's disease Mother    Stroke Father    Alcohol abuse Father    Diabetes Father    Heart disease Father    Early death Father 60   Diabetes Sister    Arthritis Sister    COPD Sister    Arthritis Maternal Aunt    Diabetes Maternal Aunt    Early death Maternal Aunt 50   Diabetes Maternal Uncle    Cancer Paternal Aunt    COPD Paternal  Aunt    Heart disease Paternal Aunt    Heart disease Paternal Uncle    Alcohol abuse Paternal Grandfather    Heart disease Paternal Grandfather    Early death Paternal Grandfather 74   Stroke Paternal Grandfather    Hypertension Son    Autoimmune disease Daughter    Breast cancer Neg Hx     Social History   Tobacco Use   Smoking status: Never   Smokeless tobacco: Never  Vaping Use   Vaping Use: Never used  Substance Use Topics   Alcohol use: Yes    Comment: glass of wine-special occasion   Drug use: Never    Home Medications Prior to Admission medications   Medication Sig Start Date End Date Taking? Authorizing Provider  zolpidem  (AMBIEN) 5 MG tablet TAKE 1 TABLET(5 MG) BY MOUTH AT BEDTIME AS NEEDED FOR SLEEP 03/08/21   Bo Merino, MD  albuterol (PROVENTIL HFA;VENTOLIN HFA) 108 (90 Base) MCG/ACT inhaler Inhale 2 puffs into the lungs every 6 (six) hours as needed for wheezing or shortness of breath. 02/11/18   Kuneff, Renee A, DO  aspirin 81 MG tablet Take 81 mg by mouth daily.    [provider]  calcium-vitamin D 250-100 MG-UNIT per tablet Take 1 tablet by mouth daily.     [provider]  COLLAGEN PO Take by mouth. Adds to tea daily    [provider]  Collagen-Vitamin C-Biotin (COLLAGEN 1500/C PO) Take by mouth. Patient not taking: Reported on 02/23/2021    [provider]  Continuous Blood Gluc Sensor (FREESTYLE LIBRE 2 SENSOR) MISC Use sensors with CGM to check sugars. 02/11/20   Philemon Kingdom, MD  Cyanocobalamin (VITAMIN B12 PO) Place under the tongue.    [provider]  DULoxetine (CYMBALTA) 60 MG capsule Take 1 capsule (60 mg total) by mouth daily. 10/15/20   Kuneff, Renee A, DO  glucose blood (ONETOUCH VERIO) test strip USE TO TEST BLOOD SUGAR TWICE A DAY DX: E11.319 07/13/20   Philemon Kingdom, MD  insulin detemir (LEVEMIR) 100 UNIT/ML injection Inject 0.1-0.13 mLs (10-13 Units total) into the skin at bedtime. 03/07/21   Philemon Kingdom, MD  Insulin Pen Needle 32G X 4 MM MISC Use 3x a day for insulin 03/07/21   Philemon Kingdom, MD  Insulin Syringe-Needle U-100 (BD INSULIN SYRINGE U/F) 31G X 5/16" 0.5 ML MISC Use 1x a day for insulin 03/07/21   Philemon Kingdom, MD  meloxicam (MOBIC) 15 MG tablet TAKE 1 TABLET(15 MG) BY MOUTH DAILY 02/15/21   Mcarthur Rossetti, MD  metFORMIN (GLUCOPHAGE) 1000 MG tablet TAKE 1 TABLET BY MOUTH TWICE DAILY WITH MEALS 01/24/21   Philemon Kingdom, MD  methocarbamol (ROBAXIN) 500 MG tablet Take 1 tablet (500 mg total) by mouth every 8 (eight) hours as needed for muscle spasms. Patient not taking: Reported on 02/23/2021  01/28/21   Davonna Belling, MD  metoprolol tartrate (LOPRESSOR) 25 MG tablet TAKE 1 TABLET(25 MG) BY MOUTH TWICE DAILY 10/15/20   Kuneff, Renee A, DO  NOVOLOG FLEXPEN 100 UNIT/ML FlexPen Inject 2-4 Units into the skin 3 (three) times daily with meals. 09/02/20   Philemon Kingdom, MD  pravastatin (PRAVACHOL) 40 MG tablet Take 1 tablet (40 mg total) by mouth daily. 10/15/20   Kuneff, Renee A, DO  sertraline (ZOLOFT) 100 MG tablet Take 1 tablet (100 mg total) by mouth daily. 10/15/20   Kuneff, Renee A, DO  tiZANidine (ZANAFLEX) 4 MG tablet Take 1 tablet (4 mg total) by mouth  every 8 (eight) hours as needed for muscle spasms. Patient not taking: Reported on 02/23/2021 02/15/21   Mcarthur Rossetti, MD  Vitamin D, Ergocalciferol, (DRISDOL) 1.25 MG (50000 UNIT) CAPS capsule TAKE 1 CAPSULE BY MOUTH EVERY 7 DAYS Patient not taking: Reported on 02/23/2021 06/07/20   Celso Amy, NP  Zinc Sulfate (ZINC 15 PO) Take by mouth.    [provider]    Allergies    Patient has no known allergies.  Review of Systems   Review of Systems  Constitutional:  Negative for chills and fever.  HENT:  Positive for postnasal drip, rhinorrhea and sinus pressure. Negative for sore throat.   Respiratory:  Positive for cough.   Cardiovascular:  Negative for chest pain.  Gastrointestinal:  Negative for abdominal pain.   Physical Exam Updated Vital Signs BP 116/86 (BP Location: Right Arm)   Pulse 97   Temp 98.1 F (36.7 C)   Resp 18   Ht 5\' 3"  (1.6 m)   Wt 71.6 kg   LMP  (LMP Unknown)   SpO2 98%   BMI 27.95 kg/m   Physical Exam Vitals and nursing note reviewed.  Constitutional:      Appearance: Normal appearance.  HENT:     Head: Normocephalic and atraumatic.     Mouth/Throat:     Mouth: Mucous membranes are moist.     Pharynx: No oropharyngeal exudate or posterior oropharyngeal erythema.  Cardiovascular:     Rate and Rhythm: Normal rate.     Comments: NO pitting edema, no calf tenderness.   Pulmonary:     Effort: Pulmonary effort is normal.     Breath sounds: No wheezing or rales.     Comments: Lungs are clear to auscultation without wheezing, or rales.  Abdominal:     General: Abdomen is flat.  Musculoskeletal:     Cervical back: Normal range of motion and neck supple.     Right lower leg: No edema.     Left lower leg: No edema.  Skin:    General: Skin is warm and dry.  Neurological:     Mental Status: She is alert and oriented to person, place, and time.    ED Results / Procedures / Treatments   Labs (all labs ordered are listed, but only abnormal results are displayed) Labs Reviewed  RESP PANEL BY RT-PCR (FLU A&B, COVID) ARPGX2 - Abnormal; Notable for the following components:      Result Value   Influenza A by PCR POSITIVE (*)    All other components within normal limits    EKG None  Radiology DG Chest 2 View  Result Date: 03/15/2021 CLINICAL DATA:  Cough. EXAM: CHEST - 2 VIEW COMPARISON:  December 10, 2018. FINDINGS: The heart size and mediastinal contours are within normal limits. Both lungs are clear. The visualized skeletal structures are unremarkable. IMPRESSION: No active cardiopulmonary disease. Electronically Signed   By: Marijo Conception M.D.   On: 03/15/2021 14:51    Procedures Procedures   Medications Ordered in ED Medications - No data to display  ED Course  I have reviewed the triage vital signs and the nursing notes.  Pertinent labs & imaging results that were available during my care of the patient were reviewed by me and considered in my medical decision making (see chart for details).  Clinical Course as of 03/15/21 1707  Tue Mar 15, 2021  1650 Influenza A By PCR(!): POSITIVE [JS]    Clinical Course User Index [JS]  Janeece Fitting, PA-C   MDM Rules/Calculators/A&P   Presents to the ED with URI symptoms have been ongoing for the past 7 days.  Spouse also present with similar etiology.  They were seen by PCP earlier this week and  tested negative for influenza, COVID-19.  On today's visit rapid influenza test is positive.  She arrived in the ED with stable vital signs, no hypoxia, no tachycardia afebrile.  Lungs are clear to auscultation without any rales or wheezing noted, there is some pain along with some cervical adenopathy noted.  Some sinus pain also noted along maxillary region.  She is speaking in full sentences without any distress.  Chest x-ray without any signs of pneumonia.  We discussed symptomatic treatment at home along with follow-up with PCP as needed.  She is ambulatory without hypoxia, no tachycardia.  Stable for discharge.  Lobbyist. Dictation errors may occur despite best attempts at proofreading.  Final Clinical Impression(s) / ED Diagnoses Final diagnoses:  Influenza A    Rx / DC Orders ED Discharge Orders     None        Janeece Fitting, Hershal Coria 03/15/21 1744    Truddie Hidden, MD 03/15/21 1919

## 2021-03-15 NOTE — Progress Notes (Signed)
Based on what you shared with me, I feel your condition warrants further evaluation as soon as possible at an Emergency department or at least at an Urgent Care. You are endorsing increased heart rate, shortness of breath and dizziness. This warrants an acute evaluation and is above the scope of these e-visits or video visits. Have someone carry you to be seen ASAP.    NOTE: There will be NO CHARGE for this eVisit   If you are having a true medical emergency please call 911.      Emergency [facility]  Get Driving Directions  [phone]  [address]  [place], [place] [address]  Open 24/7/365      [facility] Emergency Department at [place]  [address]  [place], [place] [address]  Open 24/7/365    Emergency [facility]  Get Driving Directions  [phone]  [address], [place] [address]  Open 24/7/365      [facility]  Get Driving Directions  [phone]  [address]  [place], [place] [address]  Open 24/7/365    [facility]  Emergency [place]  Get Driving Directions  [phone]  [place], [place] [address]  Open 24/7/365    [facility]  Get Driving Directions  [address]  [place], [place] [address]  Open 24/7/365    [facility]  Emergency [facility]  Get Driving Directions  [phone]  [address]  [place], [place] [address]  Open 24/7/365

## 2021-03-16 ENCOUNTER — Ambulatory Visit: Payer: Medicare Other | Admitting: Orthopaedic Surgery

## 2021-03-23 ENCOUNTER — Ambulatory Visit (INDEPENDENT_AMBULATORY_CARE_PROVIDER_SITE_OTHER): Payer: Medicare Other | Admitting: Orthopaedic Surgery

## 2021-03-23 ENCOUNTER — Encounter: Payer: Self-pay | Admitting: Orthopaedic Surgery

## 2021-03-23 ENCOUNTER — Other Ambulatory Visit: Payer: Self-pay

## 2021-03-23 DIAGNOSIS — M7061 Trochanteric bursitis, right hip: Secondary | ICD-10-CM | POA: Diagnosis not present

## 2021-03-23 MED ORDER — LIDOCAINE HCL 1 % IJ SOLN
3.0000 mL | INTRAMUSCULAR | Status: AC | PRN
  Administered 2021-03-23: 10:00:00 3 mL

## 2021-03-23 MED ORDER — METHYLPREDNISOLONE ACETATE 40 MG/ML IJ SUSP
40.0000 mg | INTRAMUSCULAR | Status: AC | PRN
Start: 2021-03-23 — End: 2021-03-23
  Administered 2021-03-23: 10:00:00 40 mg via INTRA_ARTICULAR

## 2021-03-23 NOTE — Progress Notes (Signed)
Office Visit Note   Patient: Priscilla [name]           Date of Birth: 06/20/1950           MRN: [id number] Visit Date: 03/23/2021              Requested by: Ma Hillock, DO [address] [place],  [place] [address] PCP: Ma Hillock, DO   Assessment & Plan: Visit Diagnoses:  1. Trochanteric bursitis, right hip     Plan: Right now I do not think that she needs an intra-articular injection of her right hip joint and she agrees with this.  I did feel it was appropriate to try a steroid injection over the trochanteric area and she agreed to this and tolerated well.  She understands this can elevate her blood glucose.  She has been dealing with knee issues in the past.  If this worsen she knows to let us know.  Follow-up is as needed.  All questions and concerns were answered and addressed.  Follow-Up Instructions: Return if symptoms worsen or fail to improve.   Orders:  Orders Placed This Encounter  Procedures   Large Joint Inj    No orders of the defined types were placed in this encounter.     Procedures: Large Joint Inj: R greater trochanter on 03/23/2021 9:46 AM Indications: pain and diagnostic evaluation Details: 22 G 1.5 in needle, lateral approach  Arthrogram: No  Medications: 3 mL lidocaine 1 %; 40 mg methylPREDNISolone acetate 40 MG/ML Outcome: tolerated well, no immediate complications Procedure, treatment alternatives, risks and benefits explained, specific risks discussed. Consent was given by the patient. Immediately prior to procedure a time out was called to verify the correct patient, procedure, equipment, support staff and site/side marked as required. Patient was prepped and draped in the usual sterile fashion.      Clinical Data: No additional findings.   Subjective: Chief Complaint  Patient presents with   Neck - Follow-up   Right Hip - Follow-up  The patient comes in today for continued follow-up as a relates to neck pain and right hip  pain.  Most of her pain now is over the lateral aspect of her hip.  We had set her up for an intra-articular injection in her right hip joint by Dr. Ernestina Patches but then she developed the flu and has been dealing with that for a long period time and had to even go to the emergency room last week.  She is 70 years old.  She still sounds hoarse.  She is been using a cane but as needed.  She is not having as much pain she has had before meloxicam is helping.  Her neck is also better.  She was unable to get the physical therapy either.  She is a diabetic but reports good blood glucose control.  HPI  Review of Systems Today she denies any fever, chills, nausea, vomiting.  She denies any shortness of breath or chest pain  Objective: Vital Signs: LMP  (LMP Unknown)   Physical Exam She is alert and oriented x3 and in no acute distress Ortho Exam Exam of her neck shows that it does move better in terms of flexion extension and lateral rotation and bending.  Her right hip has just a little bit of pain over the trochanteric area which is more severe when I do push on this area but not severe with rotation.  The hip ball does move smoothly.  Previous  x-rays showed moderate arthritis. Specialty Comments:  No specialty comments available.  Imaging: No results found.   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   Gastric bypass status for obesity 08/31/2016   Iron deficiency anemia 04/28/2016   Other insomnia 03/14/2016   DDD (degenerative disc disease), cervical 03/14/2016   Spondylosis without myelopathy or radiculopathy, lumbar region 01/31/2016   Anxiety and depression 06/25/2014   Psoriatic  arthritis - sees rheumatologist 02/20/2014   Asthma 12/14/2008   Diabetic peripheral neuropathy associated with type 2 diabetes mellitus ([place]) 08/06/2008   Hyperlipemia 11/12/2006   Essential hypertension 11/12/2006   Past Medical History:  Diagnosis Date   Anemia 12/14/2008   Anxiety and depression 12/14/2008   Asthma 11/12/2006   CAD (coronary artery disease) 12/14/2008   hx transluminal coronary angioplasty   Cataract    Depression    Diabetes mellitus with neuropathy ([place]) 12/14/2008   Fibromyalgia 01/04/2007   sees rheuamtology   Gastric bypass status for obesity 08/31/2016   Hyperlipemia 01/21/2010   Hypertension 12/14/2008   Leg edema 12/14/2008   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   OSA (obstructive sleep apnea) 08/06/2008   Osteoarthritis 12/14/2008   Palpitations 11/12/2006   hx sinus tachy   Psoriatic arthritis ([place])    sees rheumatology   Rotator cuff injury     Family History  Problem Relation Age of Onset   Arthritis Mother    Alzheimer's disease Mother    Stroke Father    Alcohol abuse Father    Diabetes Father    Heart disease Father    Early death Father 102   Diabetes Sister    Arthritis Sister    COPD Sister    Arthritis Maternal Aunt    Diabetes Maternal Aunt    Early death Maternal Aunt 50   Diabetes Maternal Uncle    Cancer Paternal Aunt    COPD Paternal Aunt    Heart disease Paternal Aunt    Heart disease Paternal Uncle    Alcohol abuse Paternal Grandfather    Heart disease Paternal Grandfather    Early death Paternal Grandfather 81   Stroke Paternal Grandfather    Hypertension Son    Autoimmune disease Daughter    Breast cancer Neg Hx     Past Surgical History:  Procedure Laterality Date   ABDOMINAL HYSTERECTOMY     CHOLECYSTECTOMY     LAPAROSCOPIC GASTRIC BANDING     Lumbar Facet Joint Intra-Articular Injection(s) with Fluoroscopic Guidance  09/13/2020   L4-5; Dr. Ernestina Patches   PTCA     WRIST SURGERY     LEFT   Social History   Occupational History    Occupation: retired  Tobacco Use   Smoking status: Never   Smokeless tobacco: Never  Vaping Use   Vaping Use: Never used  Substance and Sexual Activity   Alcohol use: Yes    Comment: glass of wine-special occasion   Drug use: Never   Sexual activity: Never    Birth control/protection: None

## 2021-04-06 ENCOUNTER — Other Ambulatory Visit: Payer: Self-pay | Admitting: Rheumatology

## 2021-04-07 NOTE — Telephone Encounter (Signed)
Next Visit: 05/02/2021   Last Visit: 11/30/2020   Last Fill: 03/08/2021  Dx: Other insomnia    Current Dose per office note on 11/30/2020: Ambien 5 mg 1 tablet by mouth at bedtime for insomnia.   Okay to refill Ambien?

## 2021-04-11 ENCOUNTER — Encounter: Payer: Self-pay | Admitting: Hematology & Oncology

## 2021-04-13 ENCOUNTER — Telehealth: Payer: Self-pay

## 2021-04-13 NOTE — Telephone Encounter (Signed)
Inbound request for recent office visit notes to be faxed to [facility] at [phone]

## 2021-04-18 ENCOUNTER — Ambulatory Visit: Payer: Medicare Other | Admitting: Family Medicine
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
## 2021-04-19 ENCOUNTER — Ambulatory Visit: Payer: Medicare Other | Admitting: Physical Medicine and Rehabilitation

## 2021-04-28 ENCOUNTER — Telehealth: Payer: Self-pay

## 2021-04-28 NOTE — Telephone Encounter (Signed)
Standard written order for CGM has been signed by provider and faxed.  Fax number: [phone]

## 2021-05-02 ENCOUNTER — Encounter: Payer: Self-pay | Admitting: Hematology & Oncology

## 2021-05-02 ENCOUNTER — Other Ambulatory Visit: Payer: Self-pay

## 2021-05-02 ENCOUNTER — Encounter: Payer: Self-pay | Admitting: Rheumatology

## 2021-05-02 ENCOUNTER — Ambulatory Visit (INDEPENDENT_AMBULATORY_CARE_PROVIDER_SITE_OTHER): Payer: Medicare Other | Admitting: Rheumatology

## 2021-05-02 VITALS — BP 130/74 | HR 69 | Ht 64.0 in | Wt 158.4 lb

## 2021-05-02 DIAGNOSIS — M5136 Other intervertebral disc degeneration, lumbar region: Secondary | ICD-10-CM

## 2021-05-02 DIAGNOSIS — M5030 Other cervical disc degeneration, unspecified cervical region: Secondary | ICD-10-CM

## 2021-05-02 DIAGNOSIS — Z8639 Personal history of other endocrine, nutritional and metabolic disease: Secondary | ICD-10-CM | POA: Diagnosis not present

## 2021-05-02 DIAGNOSIS — M79671 Pain in right foot: Secondary | ICD-10-CM

## 2021-05-02 DIAGNOSIS — M797 Fibromyalgia: Secondary | ICD-10-CM | POA: Diagnosis not present

## 2021-05-02 DIAGNOSIS — Z862 Personal history of diseases of the blood and blood-forming organs and certain disorders involving the immune mechanism: Secondary | ICD-10-CM

## 2021-05-02 DIAGNOSIS — L4050 Arthropathic psoriasis, unspecified: Secondary | ICD-10-CM

## 2021-05-02 DIAGNOSIS — F419 Anxiety disorder, unspecified: Secondary | ICD-10-CM

## 2021-05-02 DIAGNOSIS — M7061 Trochanteric bursitis, right hip: Secondary | ICD-10-CM

## 2021-05-02 DIAGNOSIS — G4709 Other insomnia: Secondary | ICD-10-CM

## 2021-05-02 DIAGNOSIS — R5383 Other fatigue: Secondary | ICD-10-CM

## 2021-05-02 DIAGNOSIS — Z8709 Personal history of other diseases of the respiratory system: Secondary | ICD-10-CM

## 2021-05-02 DIAGNOSIS — M19041 Primary osteoarthritis, right hand: Secondary | ICD-10-CM

## 2021-05-02 DIAGNOSIS — M25462 Effusion, left knee: Secondary | ICD-10-CM

## 2021-05-02 DIAGNOSIS — M19042 Primary osteoarthritis, left hand: Secondary | ICD-10-CM

## 2021-05-02 DIAGNOSIS — Z8739 Personal history of other diseases of the musculoskeletal system and connective tissue: Secondary | ICD-10-CM

## 2021-05-02 DIAGNOSIS — M79672 Pain in left foot: Secondary | ICD-10-CM

## 2021-05-02 DIAGNOSIS — L409 Psoriasis, unspecified: Secondary | ICD-10-CM

## 2021-05-02 DIAGNOSIS — Z9884 Bariatric surgery status: Secondary | ICD-10-CM

## 2021-05-02 DIAGNOSIS — Z8669 Personal history of other diseases of the nervous system and sense organs: Secondary | ICD-10-CM

## 2021-05-02 DIAGNOSIS — Z13820 Encounter for screening for osteoporosis: Secondary | ICD-10-CM

## 2021-05-02 DIAGNOSIS — R2681 Unsteadiness on feet: Secondary | ICD-10-CM

## 2021-05-02 DIAGNOSIS — F32A Depression, unspecified: Secondary | ICD-10-CM

## 2021-05-02 DIAGNOSIS — G252 Other specified forms of tremor: Secondary | ICD-10-CM

## 2021-05-02 MED ORDER — ZOLPIDEM TARTRATE 5 MG PO TABS: ORAL_TABLET | ORAL | 0 refills | Status: DC

## 2021-05-03 ENCOUNTER — Other Ambulatory Visit: Payer: Self-pay

## 2021-05-03 MED ORDER — DULOXETINE HCL 60 MG PO CPEP: 60.0000 mg | ORAL_CAPSULE | Freq: Every day | ORAL | 0 refills | Status: DC

## 2021-05-03 MED ORDER — SERTRALINE HCL 100 MG PO TABS: 100.0000 mg | ORAL_TABLET | Freq: Every day | ORAL | 0 refills | Status: DC

## 2021-05-03 NOTE — Progress Notes (Signed)
Assessment/Plan:   1.  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  Talked about nature and pathophysiology of both of these things.  Talked about the fact that dystonic hand tremor really does not respond very well to treatment, and ultimately we decided to hold off on any medications for this.  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  We talked about the value of botox.  The patient was educated on the botulinum toxin the black blox warning and given a copy of the botox patient medication guide.  The patient understands that this warning states that there have been reported cases of the Botox extending beyond the injection site and creating adverse effects, similar to those of botulism. This included loss of strength, trouble walking, hoarseness, trouble saying words clearly, loss of bladder control, trouble breathing, trouble swallowing, diplopia, blurry vision and ptosis. Most of the distant spread of Botox was happening in patients, primarily children, who received medication for spasticity or for cervical dystonia. The patient expressed that head tremor is not bothersome to her.  In fact, she really has not noticed it, although her daughter has.  If that is the case, I really would not recommend treatment for her.  She agrees.  2.  Diabetic neuropathy  -Clinically, this appears to be very mild, especially given the fact that she has had diabetes for 30 years.  -Patient reports that it is hard for her to sleep at night due to paresthesias.  She would like to try low-dose gabapentin, 300 mg at bedtime.  Discussed risk, benefits, side effects.  Understanding expressed.  -Patient and I did discuss over-the-counter lidocaine cream.  We discussed compounding cream as well.  -Discussed that her balance really is likely not from the diabetic neuropathy.  Appears more  orthopedic in nature.  She does not disagree.  Was already seen orthopedics for her hip.  3.  We will follow-up in 6 months with my PA.   Subjective:   Priscilla [name] was seen in consultation in the movement disorder clinic at the request of Bo Merino, MD. medical records made available to me are reviewed.  Patient is a 71 year old female with a history of psoriatic arthritis, chronic neck and low back pain, fibromyalgia, who is referred for the evaluation of tremor.   Tremor started approximately 6 years ago ago and involves the bilateral UE.  She is R hand dominant.  Tremor is most noticeable when putting earrings on, putting mascara on, doing crafts.   There is no family hx of tremor.    Affected by caffeine:  No. (drinks 1 cup of coffee/day) Affected by alcohol:  No. (drinks 1 glass wine/month) Affected by stress:  Yes.   Affected by fatigue:  unknown Spills soup if on spoon:  No. Spills glass of liquid if full:  No. Affects ADL's (tying shoes, brushing teeth, etc):  No.  Current/Previously tried tremor medications: None  Current medications that may exacerbate tremor:  None  Outside reports reviewed: lab reports, office notes, and referral letter/letters.  Last CT brain was done in 2018 and was nonacute.  Referral notes also indicate gait instability.  Referral notes indicate that source believed to due to neuropathy.  Pt states that both the feet are "numb and hurt."  Told that circulation in legs is "okay" except in one of great toes.  Is "cold" from the knees down but most  of the ache is in the feet bilaterally.  Patient is diabetic x 30 years.  Is on cymbalta for fibromyalgia and depression.  She states that podiatry gave her gabapentin but someone else told her not to take with [place] or she might not "wake up."   Patient was referred to physical therapy by Dr. Estanislado Pandy.  Pt admits she likely won't do that yet.    No Known Allergies  Current Meds  Medication Sig    aspirin 81 MG tablet Take 81 mg by mouth daily.   calcium-vitamin D 250-100 MG-UNIT per tablet Take 1 tablet by mouth daily.    COLLAGEN PO Take by mouth. Adds to tea daily   Continuous Blood Gluc Sensor (FREESTYLE LIBRE 2 SENSOR) MISC Use sensors with CGM to check sugars.   Cyanocobalamin (VITAMIN B12 PO) Place under the tongue.   DULoxetine (CYMBALTA) 60 MG capsule Take 1 capsule (60 mg total) by mouth daily.   glucose blood (ONETOUCH VERIO) test strip USE TO TEST BLOOD SUGAR TWICE A DAY DX: E11.319   insulin detemir (LEVEMIR) 100 UNIT/ML injection Inject 0.1-0.13 mLs (10-13 Units total) into the skin at bedtime.   Insulin Pen Needle 32G X 4 MM MISC Use 3x a day for insulin   Insulin Syringe-Needle U-100 (BD INSULIN SYRINGE U/F) 31G X 5/16" 0.5 ML MISC Use 1x a day for insulin   metFORMIN (GLUCOPHAGE) 1000 MG tablet TAKE 1 TABLET BY MOUTH TWICE DAILY WITH MEALS   metoprolol tartrate (LOPRESSOR) 25 MG tablet TAKE 1 TABLET(25 MG) BY MOUTH TWICE DAILY   NOVOLOG FLEXPEN 100 UNIT/ML FlexPen Inject 2-4 Units into the skin 3 (three) times daily with meals.   pravastatin (PRAVACHOL) 40 MG tablet Take 1 tablet (40 mg total) by mouth daily.   sertraline (ZOLOFT) 100 MG tablet Take 1 tablet (100 mg total) by mouth daily.   zolpidem (AMBIEN) 5 MG tablet TAKE 1 TABLET(5 MG) BY MOUTH AT BEDTIME AS NEEDED FOR SLEEP      Objective:   VITALS:   Vitals:   05/04/21 1043  BP: 121/70  Pulse: 71  SpO2: 95%  Weight: 155 lb 12.8 oz (70.7 kg)  Height: 5\' 4"  (1.626 m)   Gen:  Appears stated age and in NAD. HEENT:  Normocephalic, atraumatic. The mucous membranes are moist. The superficial temporal arteries are without ropiness or tenderness. Cardiovascular: Regular rate and rhythm. Lungs: Clear to auscultation bilaterally. Neck: There are no carotid bruits noted bilaterally.  Head is slightly turned to the right with your regular head jerk/tremor.  NEUROLOGICAL:  Orientation:  The patient is alert and  oriented x 3.   Cranial nerves: There is good facial symmetry. Extraocular muscles are intact and visual fields are full to confrontational testing. Speech is fluent and clear. Soft palate rises symmetrically and there is no tongue deviation. Hearing is intact to conversational tone. Tone: Tone is good throughout. Sensation: Sensation is intact to light touch touch throughout (facial, trunk, extremities). Vibration is intact at the bilateral big toe and just slightly decreased. There is no extinction with double simultaneous stimulation. There is no sensory dermatomal level identified. Coordination:  The patient has no dysdiadichokinesia or dysmetria. Motor: Strength is 5/5 in the bilateral upper and lower extremities.  Shoulder shrug is equal bilaterally.  There is no pronator drift.  There are no fasciculations noted. DTR's: Deep tendon reflexes are 2/4 at the bilateral biceps, triceps, brachioradialis, patella and 1/4 at the bilateral achilles.  Plantar responses are downgoing bilaterally. Gait  and Station: The patient has antagalgic gait (relates to R hip pain).  Some astasia abasia quality.    MOVEMENT EXAM: Tremor:  There is no rest tremor.  She does have some intention tremor but very little.  She has trouble getting her pen on the paper for Archimedes spirals.  Tremor is irregular in nature.  There is no asterixis or myoclonus.  I have reviewed and interpreted the following labs independently   Chemistry      Component Value Date/Time   NA 139 06/03/2020 0759   NA 141 10/13/2016 1503   K 4.8 06/03/2020 0759   K 4.9 10/13/2016 1503   CL 101 06/03/2020 0759   CL 103 10/13/2016 1503   CO2 33 (H) 06/03/2020 0759   CO2 28 10/13/2016 1503   BUN 25 (H) 06/03/2020 0759   BUN 16 10/13/2016 1503   CREATININE 0.84 06/03/2020 0759   CREATININE 0.90 10/13/2016 1503      Component Value Date/Time   CALCIUM 9.8 06/03/2020 0759   CALCIUM 9.5 10/13/2016 1503   ALKPHOS 48 06/03/2020 0759    ALKPHOS 72 10/13/2016 1503   AST 11 (L) 06/03/2020 0759   ALT 10 06/03/2020 0759   BILITOT 0.3 06/03/2020 0759      Lab Results  Component Value Date   WBC 8.1 12/03/2020   HGB 13.4 12/03/2020   HCT 41.6 12/03/2020   MCV 90.2 12/03/2020   PLT 316 12/03/2020   Lab Results  Component Value Date   TSH 2.93 05/11/2020   Lab Results  Component Value Date   VITAMINB12 1,321 (H) 05/22/2019   Lab Results  Component Value Date   HGBA1C 6.6 (A) 03/07/2021      Total time spent on today's visit was 45 minutes, including both face-to-face time and nonface-to-face time.  Time included that spent on review of records (prior notes available to me/labs/imaging if pertinent), discussing treatment and goals, answering patient's questions and coordinating care.  CC:  Kuneff, Renee A, DO

## 2021-05-04 ENCOUNTER — Ambulatory Visit (INDEPENDENT_AMBULATORY_CARE_PROVIDER_SITE_OTHER): Payer: Medicare Other | Admitting: Neurology

## 2021-05-04 ENCOUNTER — Other Ambulatory Visit: Payer: Self-pay

## 2021-05-04 ENCOUNTER — Encounter: Payer: Self-pay | Admitting: Neurology

## 2021-05-04 VITALS — BP 121/70 | HR 71 | Ht 64.0 in | Wt 155.8 lb

## 2021-05-04 DIAGNOSIS — E1142 Type 2 diabetes mellitus with diabetic polyneuropathy: Secondary | ICD-10-CM

## 2021-05-04 DIAGNOSIS — G243 Spasmodic torticollis: Secondary | ICD-10-CM

## 2021-05-04 MED ORDER — GABAPENTIN 300 MG PO CAPS: 300.0000 mg | ORAL_CAPSULE | Freq: Every day | ORAL | 1 refills | Status: DC

## 2021-05-04 NOTE — Patient Instructions (Addendum)
Start gabapentin 300 mg at bedtime  Good to see you today  The physicians and staff at [facility] are committed to providing excellent care. You may receive a survey requesting feedback about your experience at our office. We strive to receive "very good" responses to the survey questions. If you feel that your experience would prevent you from giving the office a "very good " response, please contact our office to try to remedy the situation. We may be reached at [phone]. Thank you for taking the time out of your busy day to complete the survey.

## 2021-05-06 ENCOUNTER — Encounter: Payer: Self-pay | Admitting: Family Medicine

## 2021-05-06 ENCOUNTER — Other Ambulatory Visit: Payer: Self-pay

## 2021-05-06 ENCOUNTER — Ambulatory Visit (INDEPENDENT_AMBULATORY_CARE_PROVIDER_SITE_OTHER): Payer: Medicare Other | Admitting: Family Medicine

## 2021-05-06 VITALS — BP 135/82 | HR 68 | Temp 98.4°F | Wt 156.4 lb

## 2021-05-06 DIAGNOSIS — E1142 Type 2 diabetes mellitus with diabetic polyneuropathy: Secondary | ICD-10-CM

## 2021-05-06 DIAGNOSIS — F32A Depression, unspecified: Secondary | ICD-10-CM

## 2021-05-06 DIAGNOSIS — F419 Anxiety disorder, unspecified: Secondary | ICD-10-CM | POA: Diagnosis not present

## 2021-05-06 DIAGNOSIS — G4709 Other insomnia: Secondary | ICD-10-CM | POA: Diagnosis not present

## 2021-05-06 DIAGNOSIS — E785 Hyperlipidemia, unspecified: Secondary | ICD-10-CM

## 2021-05-06 DIAGNOSIS — E11319 Type 2 diabetes mellitus with unspecified diabetic retinopathy without macular edema: Secondary | ICD-10-CM

## 2021-05-06 DIAGNOSIS — Z1211 Encounter for screening for malignant neoplasm of colon: Secondary | ICD-10-CM | POA: Diagnosis not present

## 2021-05-06 DIAGNOSIS — D500 Iron deficiency anemia secondary to blood loss (chronic): Secondary | ICD-10-CM

## 2021-05-06 DIAGNOSIS — I10 Essential (primary) hypertension: Secondary | ICD-10-CM | POA: Diagnosis not present

## 2021-05-06 DIAGNOSIS — E559 Vitamin D deficiency, unspecified: Secondary | ICD-10-CM | POA: Diagnosis not present

## 2021-05-06 DIAGNOSIS — Z794 Long term (current) use of insulin: Secondary | ICD-10-CM | POA: Diagnosis not present

## 2021-05-06 DIAGNOSIS — E663 Overweight: Secondary | ICD-10-CM

## 2021-05-06 LAB — COMPREHENSIVE METABOLIC PANEL
ALT: 15 U/L (ref 0–35)
AST: 14 U/L (ref 0–37)
Albumin: 4.2 g/dL (ref 3.5–5.2)
Alkaline Phosphatase: 58 U/L (ref 39–117)
BUN: 26 mg/dL — ABNORMAL HIGH (ref 6–23)
CO2: 31 mEq/L (ref 19–32)
Calcium: 9.6 mg/dL (ref 8.4–10.5)
Chloride: 101 mEq/L (ref 96–112)
Creatinine, Ser: 0.76 mg/dL (ref 0.40–1.20)
GFR: 78.67 mL/min (ref >60.00)
Glucose, Bld: 109 mg/dL — ABNORMAL HIGH (ref 70–99)
Potassium: 4.7 mEq/L (ref 3.5–5.1)
Sodium: 139 mEq/L (ref 135–145)
Total Bilirubin: 0.4 mg/dL (ref 0.2–1.2)
Total Protein: 6.6 g/dL (ref 6.0–8.3)

## 2021-05-06 LAB — LIPID PANEL
Cholesterol: 215 mg/dL — ABNORMAL HIGH (ref 0–200)
HDL: 60.7 mg/dL (ref >39.00)
LDL Cholesterol: 131 mg/dL — ABNORMAL HIGH (ref 0–99)
NonHDL: 153.98
Total CHOL/HDL Ratio: 4
Triglycerides: 114 mg/dL (ref 0.0–149.0)
VLDL: 22.8 mg/dL (ref 0.0–40.0)

## 2021-05-06 LAB — CBC
HCT: 41.8 % (ref 36.0–46.0)
Hemoglobin: 13.5 g/dL (ref 12.0–15.0)
MCHC: 32.3 g/dL (ref 30.0–36.0)
MCV: 86.5 fl (ref 78.0–100.0)
Platelets: 298 10*3/uL (ref 150.0–400.0)
RBC: 4.83 Mil/uL (ref 3.87–5.11)
RDW: 13.9 % (ref 11.5–15.5)
WBC: 6.7 10*3/uL (ref 4.0–10.5)

## 2021-05-06 LAB — MICROALBUMIN / CREATININE URINE RATIO
Creatinine,U: 80.4 mg/dL
Microalb Creat Ratio: 0.9 mg/g (ref 0.0–30.0)
Microalb, Ur: <0.7 mg/dL (ref 0.0–1.9)

## 2021-05-06 LAB — VITAMIN D 25 HYDROXY (VIT D DEFICIENCY, FRACTURES): VITD: 42.73 ng/mL (ref 30.00–100.00)

## 2021-05-06 LAB — TSH: TSH: 2 u[IU]/mL (ref 0.35–5.50)

## 2021-05-06 MED ORDER — SERTRALINE HCL 100 MG PO TABS: 100.0000 mg | ORAL_TABLET | Freq: Every day | ORAL | 5 refills | Status: DC

## 2021-05-06 MED ORDER — ATORVASTATIN CALCIUM 40 MG PO TABS: 40.0000 mg | ORAL_TABLET | Freq: Every day | ORAL | 3 refills | Status: DC

## 2021-05-06 MED ORDER — DULOXETINE HCL 60 MG PO CPEP: 60.0000 mg | ORAL_CAPSULE | Freq: Every day | ORAL | 5 refills | Status: DC

## 2021-05-06 MED ORDER — METOPROLOL TARTRATE 25 MG PO TABS: ORAL_TABLET | ORAL | 5 refills | Status: DC

## 2021-05-06 NOTE — Patient Instructions (Signed)
Great to see you today.  I have refilled the medication(s) we provide.   If labs were collected, we will inform you of lab results once received either by echart message or telephone call.   - echart message- for normal results that have been seen by the patient already.   - telephone call: abnormal results or if patient has not viewed results in their echart.  

## 2021-05-06 NOTE — Addendum Note (Signed)
Addended by: Howard Pouch A on: 05/06/2021 10:35 AM   Modules accepted: Orders

## 2021-05-06 NOTE — Progress Notes (Signed)
Patient Care Team    Relationship Specialty Notifications Start End  Ma Hillock, DO PCP - General Family Medicine  07/19/15    Comment: transfer to [place], Tonna Corner, MD Consulting Physician Orthopedic Surgery  08/06/15   Philemon Kingdom, MD Consulting Physician Internal Medicine  08/06/15    Comment: endocrine  Mcarthur Rossetti, MD Consulting Physician Orthopedic Surgery  08/06/15   Bo Merino, MD Consulting Physician Rheumatology  08/06/15   Volanda Napoleon, MD Consulting Physician Oncology  11/10/16   Magnus Sinning, MD Consulting Physician Physical Medicine and Rehabilitation  11/10/16   Dermatology, [facility]    11/10/16    Comment: Dr. Martinique  Hyatt, Romilda Garret, [place] Consulting Physician Podiatry  12/03/18      SUBJECTIVE Chief Complaint  Patient presents with   Follow-up    [facility] pt not fasting. Pravastatin is hard for her to swallow.    HPI: Priscilla [name] is a 71 y.o. female present for cmc Hypertension/hyperlipidemia: Pt reports compliance with metoprolol 25 mg BID and lisinopril 2.5 mg daily. Blood pressures ranges at home are not checked. Patient denies chest pain, shortness of breath, dizziness or lower extremity edema.  She reports compliance with baby aspirin and pravastatin. Diet: low sodium.  Exercise: routinely.  RF: HTN, HLD, DM, FHx HD   Peripheral neuropathy/depression/anxiety: Patient reports compliance with  Cymbalta 60 mg a day and zoloft 100 mg qd. She reports the Cymbalta has been  helpful with her neuropathy.  She feels this regimen is working very well for her.    ROS: See pertinent positives and negatives per HPI.  Depression screen [facility] 2/9 02/23/2021 10/15/2020 01/14/2020 09/23/2019 03/14/2019  Decreased Interest 0 0 0 0 0  Down, Depressed, Hopeless 1 2 3 3  0  PHQ - 2 Score 1 2 3 3  0  Altered sleeping - 2 0 3 0  Tired, decreased energy - 2 1 3 1   Change in appetite - 0 0 0 0  Feeling bad or failure about yourself  - 0 0 0 0   Trouble concentrating - 1 1 0 0  Moving slowly or fidgety/restless - 1 0 0 0  Suicidal thoughts - 0 0 0 0  PHQ-9 Score - 8 5 9 1   Difficult doing work/chores - - Somewhat difficult Somewhat difficult Not difficult at all  Some recent data might be hidden   GAD 7 : Generalized Anxiety Score 10/15/2020 09/23/2019 03/14/2019 08/13/2018  Nervous, Anxious, on Edge 2 3 0 2  Control/stop worrying 2 3 0 0  Worry too much - different things 2 3 0 3  Trouble relaxing 0 0 0 0  Restless 0 0 0 0  Easily annoyed or irritable 1 0 0 0  Afraid - awful might happen 0 0 0 0  Total GAD 7 Score 7 9 0 5  Anxiety Difficulty - Not difficult at all Not difficult at all Not difficult at all    Patient Active Problem List   Diagnosis Date Noted   Decreased circulation right great toe 05/28/2020   Hip pain 11/27/2019   Type 2 diabetes mellitus with retinopathy, with long-term current use of insulin ([place]) 05/17/2018   Overweight (BMI 25.0-29.9) 02/11/2018   Vitamin D deficiency 10/13/2016   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   Gastric bypass status for obesity 08/31/2016   Iron deficiency anemia 04/28/2016   Other insomnia 03/14/2016   DDD (degenerative disc disease), cervical 03/14/2016   Spondylosis without myelopathy or radiculopathy, lumbar  region 01/31/2016   Anxiety and depression 06/25/2014   Psoriatic arthritis - sees rheumatologist 02/20/2014   Asthma 12/14/2008   Diabetic peripheral neuropathy associated with type 2 diabetes mellitus ([place]) 08/06/2008   Hyperlipemia 11/12/2006   Essential hypertension 11/12/2006    Social History   Tobacco Use   Smoking status: Never   Smokeless tobacco: Never  Substance Use Topics   Alcohol use: Yes    Comment: glass of wine-special occasion    Current Outpatient Medications:    aspirin 81 MG tablet, Take 81 mg by mouth daily., Disp: , Rfl:    calcium-vitamin D 250-100 MG-UNIT per tablet, Take 1 tablet by mouth daily. , Disp: , Rfl:    COLLAGEN PO, Take by  mouth. Adds to tea daily, Disp: , Rfl:    Continuous Blood Gluc Sensor (FREESTYLE LIBRE 2 SENSOR) MISC, Use sensors with CGM to check sugars., Disp: 6 each, Rfl: 3   Cyanocobalamin (VITAMIN B12 PO), Place under the tongue., Disp: , Rfl:    DULoxetine (CYMBALTA) 60 MG capsule, Take 1 capsule (60 mg total) by mouth daily., Disp: 30 capsule, Rfl: 0   gabapentin (NEURONTIN) 300 MG capsule, Take 1 capsule (300 mg total) by mouth at bedtime., Disp: 90 capsule, Rfl: 1   glucose blood (ONETOUCH VERIO) test strip, USE TO TEST BLOOD SUGAR TWICE A DAY DX: E11.319, Disp: 150 each, Rfl: 3   insulin detemir (LEVEMIR) 100 UNIT/ML injection, Inject 0.1-0.13 mLs (10-13 Units total) into the skin at bedtime., Disp: 30 mL, Rfl: 3   Insulin Pen Needle 32G X 4 MM MISC, Use 3x a day for insulin, Disp: 300 each, Rfl: 3   Insulin Syringe-Needle U-100 (BD INSULIN SYRINGE U/F) 31G X 5/16" 0.5 ML MISC, Use 1x a day for insulin, Disp: 100 each, Rfl: 3   metFORMIN (GLUCOPHAGE) 1000 MG tablet, TAKE 1 TABLET BY MOUTH TWICE DAILY WITH MEALS, Disp: 180 tablet, Rfl: 3   metoprolol tartrate (LOPRESSOR) 25 MG tablet, TAKE 1 TABLET(25 MG) BY MOUTH TWICE DAILY, Disp: 60 tablet, Rfl: 5   NOVOLOG FLEXPEN 100 UNIT/ML FlexPen, Inject 2-4 Units into the skin 3 (three) times daily with meals., Disp: 15 mL, Rfl: 2   pravastatin (PRAVACHOL) 40 MG tablet, Take 1 tablet (40 mg total) by mouth daily., Disp: 30 tablet, Rfl: 11   sertraline (ZOLOFT) 100 MG tablet, Take 1 tablet (100 mg total) by mouth daily., Disp: 30 tablet, Rfl: 0   zolpidem (AMBIEN) 5 MG tablet, TAKE 1 TABLET(5 MG) BY MOUTH AT BEDTIME AS NEEDED FOR SLEEP, Disp: 30 tablet, Rfl: 0  No Known Allergies  OBJECTIVE: BP 135/82    Pulse 68    Temp 98.4 F (36.9 C)    Wt 156 lb 6.4 oz (70.9 kg)    LMP  (LMP Unknown)    SpO2 100%    BMI 26.85 kg/m  Physical Exam Vitals and nursing note reviewed.  Constitutional:      General: She is not in acute distress.    Appearance: Normal  appearance. She is not ill-appearing, toxic-appearing or diaphoretic.  HENT:     Head: Normocephalic and atraumatic.     Mouth/Throat:     Mouth: Mucous membranes are moist.  Eyes:     General: No scleral icterus.       Right eye: No discharge.        Left eye: No discharge.     Extraocular Movements: Extraocular movements intact.     Conjunctiva/sclera: Conjunctivae normal.  Pupils: Pupils are equal, round, and reactive to light.  Cardiovascular:     Rate and Rhythm: Normal rate and regular rhythm.  Pulmonary:     Effort: Pulmonary effort is normal. No respiratory distress.     Breath sounds: Normal breath sounds. No wheezing, rhonchi or rales.  Musculoskeletal:     Cervical back: Neck supple. No tenderness.  Lymphadenopathy:     Cervical: No cervical adenopathy.  Skin:    General: Skin is warm and dry.     Coloration: Skin is not jaundiced or pale.     Findings: No erythema or rash.  Neurological:     Mental Status: She is alert and oriented to person, place, and time. Mental status is at baseline.     Motor: No weakness.     Gait: Gait normal.  Psychiatric:        Mood and Affect: Mood normal.        Behavior: Behavior normal.        Thought Content: Thought content normal.        Judgment: Judgment normal.     ASSESSMENT AND PLAN: Priscilla [name] is a 71 y.o. female present for  Essential hypertension/palpitations/HLD/overweight -stable  - Low blood pressures with reported dizziness last visit. Lisinopril 2.5 was dc'd -Continue metoprolol 25 mg daily, if blood pressure still less than 440 systolic after discontinuing lisinopril then she is instructed to take a half a tab twice daily of her metoprolol. -DC pravastatin for liptior start (hopefully pill size smaller for her) Hydrate - low sodium. Routine exercise.  - con't baby ASA Cbc, cmp, tsh, lipid - f/u 5.5 months.   Vitamin D deficiency: Vitamin D levels today Continue supplementation   Anxiety and  depression/neuropathy Stable Continue Cymbalta 60 mg daily Contineu  Zoloft 100 mg.   -Follow-up 5.5 months-sooner if depression anxiety is not adequately controlled.   type 2 diabetes mellitus with retinopathy, with long-term current use of insulin ([place]) Managed by endocrinology.  A1c is at goal. Urine microalb collected today   No orders of the defined types were placed in this encounter.   No orders of the defined types were placed in this encounter.   Referral Orders  No referral(s) requested today      Howard Pouch, DO 05/06/2021

## 2021-05-07 LAB — IRON,TIBC AND FERRITIN PANEL
%SAT: 24 % (calc) (ref 16–45)
Ferritin: 161 ng/mL (ref 16–288)
Iron: 75 ug/dL (ref 45–160)
TIBC: 310 mcg/dL (calc) (ref 250–450)

## 2021-05-19 ENCOUNTER — Telehealth: Payer: Self-pay | Admitting: Physical Medicine and Rehabilitation

## 2021-05-19 NOTE — Telephone Encounter (Signed)
Patient called. She would like an appointment with Dr. Ernestina Patches. Her call back number is [phone]

## 2021-06-06 ENCOUNTER — Encounter: Payer: Self-pay | Admitting: Physical Medicine and Rehabilitation

## 2021-06-06 ENCOUNTER — Other Ambulatory Visit: Payer: Self-pay

## 2021-06-06 ENCOUNTER — Ambulatory Visit (INDEPENDENT_AMBULATORY_CARE_PROVIDER_SITE_OTHER): Payer: Medicare Other | Admitting: Physical Medicine and Rehabilitation

## 2021-06-06 ENCOUNTER — Ambulatory Visit: Payer: Self-pay

## 2021-06-06 DIAGNOSIS — M542 Cervicalgia: Secondary | ICD-10-CM

## 2021-06-06 DIAGNOSIS — M25551 Pain in right hip: Secondary | ICD-10-CM | POA: Diagnosis not present

## 2021-06-06 DIAGNOSIS — M7918 Myalgia, other site: Secondary | ICD-10-CM | POA: Diagnosis not present

## 2021-06-06 MED ORDER — BUPIVACAINE HCL 0.25 % IJ SOLN
4.0000 mL | INTRAMUSCULAR | Status: AC | PRN
  Administered 2021-06-06: 4 mL via INTRA_ARTICULAR

## 2021-06-06 MED ORDER — TRIAMCINOLONE ACETONIDE 40 MG/ML IJ SUSP
60.0000 mg | INTRAMUSCULAR | Status: AC | PRN
  Administered 2021-06-06: 60 mg via INTRA_ARTICULAR

## 2021-06-06 NOTE — Progress Notes (Signed)
Pt state right hip pain. Pt state walking, getting out of a car and turning her leg in or out makes the pain worse. Pt state she takes pain meds and uses heat to help ease her pain.  Numeric Pain Rating Scale and Functional Assessment Average Pain 2   In the last MONTH (on 0-10 scale) has pain interfered with the following?  1. General activity like being  able to carry out your everyday physical activities such as walking, climbing stairs, carrying groceries, or moving a chair?  Rating(7)   +Driver, -BT, -Dye Allergies.
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
## 2021-06-07 ENCOUNTER — Other Ambulatory Visit: Payer: Self-pay | Admitting: *Deleted

## 2021-06-07 MED ORDER — ZOLPIDEM TARTRATE 5 MG PO TABS: ORAL_TABLET | ORAL | 0 refills | Status: DC

## 2021-06-07 NOTE — Telephone Encounter (Signed)
Refill request received via fax  Next Visit: 10/31/2021  Last Visit: 05/02/2021  Last Fill: 05/02/2021  Dx: Other insomnia   Current Dose per office note on 05/02/2021: Ambien 5 mg 1 tablet by mouth at bedtime for insomnia  Okay to refill Ambien?

## 2021-06-16 ENCOUNTER — Encounter: Payer: Self-pay | Admitting: Physical Therapy

## 2021-06-16 ENCOUNTER — Ambulatory Visit (INDEPENDENT_AMBULATORY_CARE_PROVIDER_SITE_OTHER): Payer: Medicare Other | Admitting: Physical Therapy

## 2021-06-16 ENCOUNTER — Other Ambulatory Visit: Payer: Self-pay

## 2021-06-16 DIAGNOSIS — M542 Cervicalgia: Secondary | ICD-10-CM | POA: Diagnosis not present

## 2021-06-16 DIAGNOSIS — R29898 Other symptoms and signs involving the musculoskeletal system: Secondary | ICD-10-CM | POA: Diagnosis not present

## 2021-06-16 DIAGNOSIS — R293 Abnormal posture: Secondary | ICD-10-CM | POA: Diagnosis not present
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
## 2021-06-20 ENCOUNTER — Other Ambulatory Visit: Payer: Self-pay

## 2021-06-20 DIAGNOSIS — E11319 Type 2 diabetes mellitus with unspecified diabetic retinopathy without macular edema: Secondary | ICD-10-CM

## 2021-06-20 DIAGNOSIS — Z794 Long term (current) use of insulin: Secondary | ICD-10-CM

## 2021-06-20 MED ORDER — FREESTYLE LIBRE 2 READER DEVI: 0 refills | Status: DC

## 2021-06-20 NOTE — Telephone Encounter (Signed)
Pt requested a new rx for her Freestyle Libre Reader be faxed to [facility] at [phone] ?

## 2021-06-23 ENCOUNTER — Encounter: Payer: Self-pay | Admitting: Internal Medicine

## 2021-06-23 MED ORDER — FREESTYLE LIBRE 2 READER DEVI: 0 refills | Status: DC

## 2021-06-28 ENCOUNTER — Ambulatory Visit (INDEPENDENT_AMBULATORY_CARE_PROVIDER_SITE_OTHER): Payer: Medicare Other | Admitting: Physical Therapy

## 2021-06-28 ENCOUNTER — Other Ambulatory Visit: Payer: Self-pay

## 2021-06-28 ENCOUNTER — Encounter: Payer: Self-pay | Admitting: Physical Therapy

## 2021-06-28 DIAGNOSIS — R293 Abnormal posture: Secondary | ICD-10-CM

## 2021-06-28 DIAGNOSIS — R29898 Other symptoms and signs involving the musculoskeletal system: Secondary | ICD-10-CM

## 2021-06-28 DIAGNOSIS — M542 Cervicalgia: Secondary | ICD-10-CM | POA: Diagnosis not present

## 2021-06-28 NOTE — Therapy (Signed)
?OUTPATIENT PHYSICAL THERAPY TREATMENT NOTE ? ? ?Patient Name: Priscilla [name] ?MRN: [id number] ?DOB:12/22/1950, 71 y.o., female ?Today's Date: 06/28/2021 ? ?PCP: Ma Hillock, DO ?REFERRING PROVIDER: Magnus Sinning, MD ? ? PT End of Session - 06/28/21 1102   ? ? Visit Number 2   ? Number of Visits 6   ? Date for PT Re-Evaluation 07/28/21   ? PT Start Time 1100   ? PT Stop Time 1140   ? PT Time Calculation (min) 40 min   ? Activity Tolerance Patient tolerated treatment well   ? Behavior During Therapy [facility] for tasks assessed/performed   ? ?  ?  ? ?  ? ? ?Past Medical History:  ?Diagnosis Date  ? Anemia 12/14/2008  ? Anxiety and depression 12/14/2008  ? Asthma 11/12/2006  ? CAD (coronary artery disease) 12/14/2008  ? hx transluminal coronary angioplasty  ? Cataract   ? Depression   ? Diabetes mellitus with neuropathy ([place]) 12/14/2008  ? Fibromyalgia 01/04/2007  ? sees rheuamtology  ? Gastric bypass status for obesity 08/31/2016  ? Hyperlipemia 01/21/2010  ? Hypertension 12/14/2008  ? Leg edema 12/14/2008  ? 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 OSA (obstructive sleep apnea) 08/06/2008  ? Osteoarthritis 12/14/2008  ? Palpitations 11/12/2006  ? hx sinus tachy  ? Psoriatic arthritis ([place])   ? sees rheumatology  ? Rotator cuff injury   ? ?Past Surgical History:  ?Procedure Laterality Date  ? ABDOMINAL HYSTERECTOMY    ? CHOLECYSTECTOMY    ? LAPAROSCOPIC GASTRIC BANDING    ? Lumbar Facet Joint Intra-Articular Injection(s) with Fluoroscopic Guidance  09/13/2020  ? L4-5; Dr. Ernestina Patches  ? PTCA    ? WRIST SURGERY    ? LEFT  ? ?Patient Active Problem List  ? Diagnosis Date Noted  ? Decreased circulation right great toe 05/28/2020  ? Hip pain 11/27/2019  ? Type 2 diabetes mellitus with retinopathy, with long-term current use of insulin ([place]) 05/17/2018  ? Overweight (BMI 25.0-29.9) 02/11/2018  ? Vitamin D deficiency 10/13/2016  ? 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 Gastric bypass status for obesity 08/31/2016  ? Iron deficiency anemia 04/28/2016  ?  Other insomnia 03/14/2016  ? DDD (degenerative disc disease), cervical 03/14/2016  ? Spondylosis without myelopathy or radiculopathy, lumbar region 01/31/2016  ? Anxiety and depression 06/25/2014  ? Psoriatic arthritis - sees rheumatologist 02/20/2014  ? Asthma 12/14/2008  ? Diabetic peripheral neuropathy associated with type 2 diabetes mellitus ([place]) 08/06/2008  ? Hyperlipemia 11/12/2006  ? Essential hypertension 11/12/2006  ? ? ?REFERRING DIAG: M54.2 (ICD-10-CM) - Cervicalgia M79.18 (ICD-10-CM) - Myofascial pain syndrome  ? ?THERAPY DIAG:  ?Cervicalgia ? ?Abnormal posture ? ?Other symptoms and signs involving the musculoskeletal system ? ?PERTINENT HISTORY: DM, anxiety, depression, gastric bypass, psoriatic arthritis, cervical dystonia ? ?PRECAUTIONS: None ? ?SUBJECTIVE: feels like her neck is moving a little better, exercises are doing okay.   ? ?PAIN:  ?Are you having pain? Yes: NPRS scale: 2/10 ?Pain location: neck, Lt side > Rt ?Pain description: tightness ?Aggravating factors: coming out of exercise motion ?Relieving factors: heat ? ? ? ? ?OBJECTIVE:  ?  ?PATIENT SURVEYS:  ?06/16/21 FOTO 45 (predicted 71) ?   ?  ?CERVICAL AROM/PROM Pain with all motions ?  ?A/PROM A ?ROM (deg) ?06/16/2021 AROM ?06/28/21  ?Flexion 18 20  ?Extension 20 25  ?Right lateral flexion 15 25  ?Left lateral flexion 22 30  ?Right rotation 40 38  ?Left rotation 25 48  ? (Blank rows = not tested) ?  ? ?  ?  TODAY'S TREATMENT:  ?06/28/21 ?Therex: ?Seated cervical retraction 10 x 5 sec hold ?Seated cervical rotation x 10 reps bil ?Seated cervical half/full circles x 10 reps ? ?Manual: ?STM to Lt cervical paraspinals and suboccipitals; skilled palpation and monitoring of soft tissue ? ?Trigger Point Dry-Needling  ?Treatment instructions: Expect mild to moderate muscle soreness. S/S of pneumothorax if dry needled over a lung field, and to seek immediate medical attention should they occur. Patient verbalized understanding of these instructions and  education. ? ?Patient Consent Given: Yes ?Education handout provided: Yes ?Muscles treated: Lt oblique capitus, cervical paraspinals, suboccipitals ?Electrical stimulation performed: No ?Parameters: N/A ?Treatment response/outcome: twitch responses noted ?   ? ?06/16/21: see HEP - reviewed and discussed with pt; pt familiar with all activities.  Also dicussed home TENS unit and DN (likely will hold due to dystonia) ?  ?  ?PATIENT EDUCATION:  ?Education details: HEP ?Person educated: Patient ?Education method: Explanation, Demonstration, and Handouts ?Education comprehension: verbalized understanding, returned demonstration, and needs further education ?  ?  ?HOME EXERCISE PROGRAM: ?Access Code: [email or web address] ?URL: [email or web address] ?Date: 06/16/2021 ?Prepared by: Faustino Congress ?  ?Exercises ?Supine Chin Tuck - 2 x daily - 7 x weekly - 1 sets - 10 reps - 5 sec hold ?Half Neck Circles - 2 x daily - 7 x weekly - 1 sets - 10 reps ?Seated Scapular Retraction - 2 x daily - 7 x weekly - 1 sets - 10 reps - 5 sec hold ?Standing Backward Shoulder Rolls - 2 x daily - 7 x weekly - 1 sets - 10 reps ?Seated Cervical Rotation AROM - 1 x daily - 7 x weekly - 1 sets - 10 reps - 5 sec hold ?Supine Cervical Rotation AROM on Pillow - 1 x daily - 7 x weekly - 1 sets - 10 reps - 5 sec hold ?Supine Suboccipital Release with Tennis Balls - 1 x daily - 7 x weekly - 3-5 min hold ?  ?Patient Education ?TENS Unit ?Trigger Point Dry Needling ?  ?ASSESSMENT: ?  ?CLINICAL IMPRESSION: ?Pt with improvements noted in all cervical ROM measurements except Rt rotation unchanged.  Overall making steady progress at this time.  Will continue to benefit from PT to maximize function. ?  ?OBJECTIVE IMPAIRMENTS decreased ROM, decreased strength, hypomobility, increased fascial restrictions, increased muscle spasms, impaired tone, postural dysfunction, and pain.  ?  ?ACTIVITY LIMITATIONS cleaning, community activity, driving, meal prep,  laundry, and shopping.  ?  ?PERSONAL FACTORS Age, Past/current experiences, and 3+ comorbidities: DM, anxiety, depression, gastric bypass, psoriatic arthritis, cervical dystonia  are also affecting patient's functional outcome.  ?  ?  ?REHAB POTENTIAL: Good ?  ?CLINICAL DECISION MAKING: Evolving/moderate complexity ?  ?EVALUATION COMPLEXITY: Moderate ?  ?  ?GOALS: ?Goals reviewed with patient? Yes ?  ?SHORT TERM GOALS: ?  ?Independent with initial HEP ?Target date: 07/07/2021 ?Goal status: INITIAL ?  ?2.  Report neck pain < 3/10 with activity for improved function ?Target date: 07/07/2021 ?Goal status: INITIAL ?  ?  ?LONG TERM GOALS: ?  ?Independent with final HEP ?Target date: 07/28/2021 ?Goal status: INITIAL ?  ?2.  FOTO score improved to 58 ?Target date: 07/28/2021 ?Goal status: INITIAL ?  ?3.  Cervical AROM improved 5 deg each direction for improved mobility and pain ?Target date: 07/28/2021 ?Goal status: INITIAL ?  ?4.  Report pain < 2/10 with activity for improved function ?Target date: 07/28/2021 ?Goal status: INITIAL ?  ?  ?PLAN: ?PT FREQUENCY: 1x/week ?  ?PT DURATION:  6 weeks ?  ?PLANNED INTERVENTIONS: Therapeutic exercises, Therapeutic activity, Neuromuscular re-education, Patient/Family education, Joint mobilization, Dry Needling, Electrical stimulation, Spinal mobilization, Cryotherapy, Moist heat, Taping, Traction, and Manual therapy ?  ?PLAN FOR NEXT SESSION: assess response to DN/manual, repeat if beneficial, may need to try TENS to see if that would be a good option longer term. postural exercises ?  ? ?Faustino Congress, PT ?06/28/2021, 1:01 PM ? ?  ? ?

## 2021-07-05 ENCOUNTER — Ambulatory Visit: Payer: Medicare Other | Admitting: Internal Medicine

## 2021-07-05 ENCOUNTER — Other Ambulatory Visit: Payer: Self-pay | Admitting: Rheumatology

## 2021-07-05 NOTE — Telephone Encounter (Signed)
Next Visit: 10/31/2021 ?  ?Last Visit: 05/02/2021 ?  ?Last Fill: 06/07/2021 ?  ?Dx: Other insomnia  ?  ?Current Dose per office note on 05/02/2021: Ambien 5 mg 1 tablet by mouth at bedtime for insomnia ?  ?Okay to refill Ambien?   ?

## 2021-07-06 ENCOUNTER — Other Ambulatory Visit: Payer: Self-pay

## 2021-07-06 ENCOUNTER — Ambulatory Visit (INDEPENDENT_AMBULATORY_CARE_PROVIDER_SITE_OTHER): Payer: Medicare Other | Admitting: Physical Therapy

## 2021-07-06 ENCOUNTER — Encounter: Payer: Self-pay | Admitting: Physical Therapy

## 2021-07-06 DIAGNOSIS — M542 Cervicalgia: Secondary | ICD-10-CM | POA: Diagnosis not present

## 2021-07-06 DIAGNOSIS — R29898 Other symptoms and signs involving the musculoskeletal system: Secondary | ICD-10-CM | POA: Diagnosis not present

## 2021-07-06 DIAGNOSIS — R293 Abnormal posture: Secondary | ICD-10-CM | POA: Diagnosis not present

## 2021-07-06 NOTE — Therapy (Signed)
?OUTPATIENT PHYSICAL THERAPY TREATMENT NOTE ? ? ?Patient Name: Priscilla [name] ?MRN: [id number] ?DOB:1951/01/21, 71 y.o., female ?Today's Date: 07/06/2021 ? ?PCP: Ma Hillock, DO ?REFERRING PROVIDER: Magnus Sinning, MD ? ? PT End of Session - 07/06/21 1043   ? ? Visit Number 3   ? Number of Visits 6   ? Date for PT Re-Evaluation 07/28/21   ? PT Start Time 0930   ? PT Stop Time 1015   ? PT Time Calculation (min) 45 min   ? Activity Tolerance Patient tolerated treatment well   ? Behavior During Therapy [facility] for tasks assessed/performed   ? ?  ?  ? ?  ? ? ? ?Past Medical History:  ?Diagnosis Date  ? Anemia 12/14/2008  ? Anxiety and depression 12/14/2008  ? Asthma 11/12/2006  ? CAD (coronary artery disease) 12/14/2008  ? hx transluminal coronary angioplasty  ? Cataract   ? Depression   ? Diabetes mellitus with neuropathy ([place]) 12/14/2008  ? Fibromyalgia 01/04/2007  ? sees rheuamtology  ? Gastric bypass status for obesity 08/31/2016  ? Hyperlipemia 01/21/2010  ? Hypertension 12/14/2008  ? Leg edema 12/14/2008  ? 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 OSA (obstructive sleep apnea) 08/06/2008  ? Osteoarthritis 12/14/2008  ? Palpitations 11/12/2006  ? hx sinus tachy  ? Psoriatic arthritis ([place])   ? sees rheumatology  ? Rotator cuff injury   ? ?Past Surgical History:  ?Procedure Laterality Date  ? ABDOMINAL HYSTERECTOMY    ? CHOLECYSTECTOMY    ? LAPAROSCOPIC GASTRIC BANDING    ? Lumbar Facet Joint Intra-Articular Injection(s) with Fluoroscopic Guidance  09/13/2020  ? L4-5; Dr. Ernestina Patches  ? PTCA    ? WRIST SURGERY    ? LEFT  ? ?Patient Active Problem List  ? Diagnosis Date Noted  ? Decreased circulation right great toe 05/28/2020  ? Hip pain 11/27/2019  ? Type 2 diabetes mellitus with retinopathy, with long-term current use of insulin ([place]) 05/17/2018  ? Overweight (BMI 25.0-29.9) 02/11/2018  ? Vitamin D deficiency 10/13/2016  ? 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 Gastric bypass status for obesity 08/31/2016  ? Iron deficiency anemia 04/28/2016  ?  Other insomnia 03/14/2016  ? DDD (degenerative disc disease), cervical 03/14/2016  ? Spondylosis without myelopathy or radiculopathy, lumbar region 01/31/2016  ? Anxiety and depression 06/25/2014  ? Psoriatic arthritis - sees rheumatologist 02/20/2014  ? Asthma 12/14/2008  ? Diabetic peripheral neuropathy associated with type 2 diabetes mellitus ([place]) 08/06/2008  ? Hyperlipemia 11/12/2006  ? Essential hypertension 11/12/2006  ? ? ?REFERRING DIAG: M54.2 (ICD-10-CM) - Cervicalgia M79.18 (ICD-10-CM) - Myofascial pain syndrome  ? ?THERAPY DIAG:  ?Cervicalgia ? ?Abnormal posture ? ?Other symptoms and signs involving the musculoskeletal system ? ?PERTINENT HISTORY: DM, anxiety, depression, gastric bypass, psoriatic arthritis, cervical dystonia ? ?PRECAUTIONS: None ? ?SUBJECTIVE: thinks the upper trap stretch on Rt side is aggravating Lt side muscles; otherwise doing pretty well; pain is a little more checking mirrors.   ?PAIN:  ?Are you having pain? Yes: NPRS scale: 4/10 ?Pain location: neck, Lt side > Rt ?Pain description: tightness ?Aggravating factors: coming out of exercise motion ?Relieving factors: heat ? ? ? ? ?OBJECTIVE:  ?  ?PATIENT SURVEYS:  ?06/16/21 FOTO 45 (predicted 43) ?   ?  ?CERVICAL AROM/PROM Pain with all motions ?  ?A/PROM A ?ROM (deg) ?06/16/2021 AROM ?06/28/21  ?Flexion 18 20  ?Extension 20 25  ?Right lateral flexion 15 25  ?Left lateral flexion 22 30  ?Right rotation 40 38  ?  Left rotation 25 48  ? (Blank rows = not tested) ?  ? ?  ?TODAY'S TREATMENT:  ?07/06/21 ?Modalities: ?TENS x 15 min with exercise - preset back with IFC configuration, intensity to tolerance - reviewed options for purchase ? ?Therex: ?Rows L2 x 20 reps ?Shoulder Ext L2 x 20 reps ?Bil shoulder ER L2 x 20 reps ?Standing cervical retraction into yellow ball 20 x 3-5 sec hold ?Seated scapular retraction 5 sec hold x 20 reps ?Seated shoulder rolls x20 reps ? ?Self-Care ?Discussed cervical dystonia and DN response.  Minimal response noted  as expected due to dystonia.  Recommend trial of home TENS unit to help with symptoms, but reinforced neurologist's recommendation for Botox and to consider this as this is considered best practice.  Pt to consider. ? ?06/28/21 ?Therex: ?Seated cervical retraction 10 x 5 sec hold ?Seated cervical rotation x 10 reps bil ?Seated cervical half/full circles x 10 reps ? ?Manual: ?STM to Lt cervical paraspinals and suboccipitals; skilled palpation and monitoring of soft tissue ? ?Trigger Point Dry-Needling  ?Treatment instructions: Expect mild to moderate muscle soreness. S/S of pneumothorax if dry needled over a lung field, and to seek immediate medical attention should they occur. Patient verbalized understanding of these instructions and education. ? ?Patient Consent Given: Yes ?Education handout provided: Yes ?Muscles treated: Lt oblique capitus, cervical paraspinals, suboccipitals ?Electrical stimulation performed: No ?Parameters: N/A ?Treatment response/outcome: twitch responses noted ?   ? ?06/16/21: see HEP - reviewed and discussed with pt; pt familiar with all activities.  Also dicussed home TENS unit and DN (likely will hold due to dystonia) ?  ?  ?PATIENT EDUCATION:  ?Education details: HEP ?Person educated: Patient ?Education method: Explanation, Demonstration, and Handouts ?Education comprehension: verbalized understanding, returned demonstration, and needs further education ?  ?  ?HOME EXERCISE PROGRAM: ?Access Code: [email or web address] ?URL: [email or web address] ?Date: 06/16/2021 ?Prepared by: Faustino Congress ?  ?Exercises ?Supine Chin Tuck - 2 x daily - 7 x weekly - 1 sets - 10 reps - 5 sec hold ?Half Neck Circles - 2 x daily - 7 x weekly - 1 sets - 10 reps ?Seated Scapular Retraction - 2 x daily - 7 x weekly - 1 sets - 10 reps - 5 sec hold ?Standing Backward Shoulder Rolls - 2 x daily - 7 x weekly - 1 sets - 10 reps ?Seated Cervical Rotation AROM - 1 x daily - 7 x weekly - 1 sets - 10 reps - 5 sec  hold ?Supine Cervical Rotation AROM on Pillow - 1 x daily - 7 x weekly - 1 sets - 10 reps - 5 sec hold ?Supine Suboccipital Release with Tennis Balls - 1 x daily - 7 x weekly - 3-5 min hold ?  ?Patient Education ?TENS Unit ?Trigger Point Dry Needling ?  ?ASSESSMENT: ?  ?CLINICAL IMPRESSION: ?Pt tolerated session today well with trial of TENS unit with exercise.  Will continue to benefit from PT to maximize function. ?  ?OBJECTIVE IMPAIRMENTS decreased ROM, decreased strength, hypomobility, increased fascial restrictions, increased muscle spasms, impaired tone, postural dysfunction, and pain.  ?  ?ACTIVITY LIMITATIONS cleaning, community activity, driving, meal prep, laundry, and shopping.  ?  ?PERSONAL FACTORS Age, Past/current experiences, and 3+ comorbidities: DM, anxiety, depression, gastric bypass, psoriatic arthritis, cervical dystonia  are also affecting patient's functional outcome.  ?  ?  ?REHAB POTENTIAL: Good ?  ?CLINICAL DECISION MAKING: Evolving/moderate complexity ?  ?EVALUATION COMPLEXITY: Moderate ?  ?  ?GOALS: ?Goals reviewed with patient? Yes ?  ?  SHORT TERM GOALS: ?  ?Independent with initial HEP ?Target date: 07/07/2021 ?Goal status: MET 07/06/21 ?  ?2.  Report neck pain < 3/10 with activity for improved function ?Target date: 07/07/2021 ?Goal status: ONGOING 07/06/21 ?  ?  ?LONG TERM GOALS: ?  ?Independent with final HEP ?Target date: 07/28/2021 ?Goal status: INITIAL ?  ?2.  FOTO score improved to 58 ?Target date: 07/28/2021 ?Goal status: INITIAL ?  ?3.  Cervical AROM improved 5 deg each direction for improved mobility and pain ?Target date: 07/28/2021 ?Goal status: INITIAL ?  ?4.  Report pain < 2/10 with activity for improved function ?Target date: 07/28/2021 ?Goal status: INITIAL ?  ?  ?PLAN: ?PT FREQUENCY: 1x/week ?  ?PT DURATION: 6 weeks ?  ?PLANNED INTERVENTIONS: Therapeutic exercises, Therapeutic activity, Neuromuscular re-education, Patient/Family education, Joint mobilization, Dry Needling,  Electrical stimulation, Spinal mobilization, Cryotherapy, Moist heat, Taping, Traction, and Manual therapy ?  ?PLAN FOR NEXT SESSION: assess response to TENS, postural exercises ?  ? ?Faustino Congress, PT ?3/

## 2021-07-07 ENCOUNTER — Encounter: Payer: Self-pay | Admitting: Internal Medicine

## 2021-07-09 ENCOUNTER — Other Ambulatory Visit: Payer: Self-pay | Admitting: Orthopaedic Surgery

## 2021-07-12 ENCOUNTER — Encounter: Payer: Self-pay | Admitting: Family Medicine

## 2021-07-13 ENCOUNTER — Encounter: Payer: Self-pay | Admitting: Physical Therapy

## 2021-07-13 ENCOUNTER — Encounter: Payer: Self-pay | Admitting: Physical Medicine and Rehabilitation

## 2021-07-13 ENCOUNTER — Ambulatory Visit (INDEPENDENT_AMBULATORY_CARE_PROVIDER_SITE_OTHER): Payer: Medicare Other | Admitting: Physical Therapy

## 2021-07-13 DIAGNOSIS — R29898 Other symptoms and signs involving the musculoskeletal system: Secondary | ICD-10-CM

## 2021-07-13 DIAGNOSIS — R293 Abnormal posture: Secondary | ICD-10-CM

## 2021-07-13 DIAGNOSIS — M542 Cervicalgia: Secondary | ICD-10-CM | POA: Diagnosis not present

## 2021-07-13 NOTE — Therapy (Signed)
?OUTPATIENT PHYSICAL THERAPY TREATMENT NOTE ? ? ?Patient Name: Priscilla [name] ?MRN: [id number] ?DOB:03-14-1951, 71 y.o., female ?Today's Date: 07/13/2021 ? ?PCP: Ma Hillock, DO ?REFERRING PROVIDER: Magnus Sinning, MD ? ? PT End of Session - 07/13/21 0932   ? ? Visit Number 4   ? Number of Visits 6   ? Date for PT Re-Evaluation 07/28/21   ? PT Start Time 0930   ? PT Stop Time 1005   ? PT Time Calculation (min) 35 min   ? Activity Tolerance Patient tolerated treatment well   ? Behavior During Therapy [facility] for tasks assessed/performed   ? ?  ?  ? ?  ? ? ? ? ?Past Medical History:  ?Diagnosis Date  ? Anemia 12/14/2008  ? Anxiety and depression 12/14/2008  ? Asthma 11/12/2006  ? CAD (coronary artery disease) 12/14/2008  ? hx transluminal coronary angioplasty  ? Cataract   ? Depression   ? Diabetes mellitus with neuropathy ([place]) 12/14/2008  ? Fibromyalgia 01/04/2007  ? sees rheuamtology  ? Gastric bypass status for obesity 08/31/2016  ? Hyperlipemia 01/21/2010  ? Hypertension 12/14/2008  ? Leg edema 12/14/2008  ? 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 OSA (obstructive sleep apnea) 08/06/2008  ? Osteoarthritis 12/14/2008  ? Palpitations 11/12/2006  ? hx sinus tachy  ? Psoriatic arthritis ([place])   ? sees rheumatology  ? Rotator cuff injury   ? ?Past Surgical History:  ?Procedure Laterality Date  ? ABDOMINAL HYSTERECTOMY    ? CHOLECYSTECTOMY    ? LAPAROSCOPIC GASTRIC BANDING    ? Lumbar Facet Joint Intra-Articular Injection(s) with Fluoroscopic Guidance  09/13/2020  ? L4-5; Dr. Ernestina Patches  ? PTCA    ? WRIST SURGERY    ? LEFT  ? ?Patient Active Problem List  ? Diagnosis Date Noted  ? Decreased circulation right great toe 05/28/2020  ? Hip pain 11/27/2019  ? Type 2 diabetes mellitus with retinopathy, with long-term current use of insulin ([place]) 05/17/2018  ? Overweight (BMI 25.0-29.9) 02/11/2018  ? Vitamin D deficiency 10/13/2016  ? 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 Gastric bypass status for obesity 08/31/2016  ? Iron deficiency anemia 04/28/2016  ?  Other insomnia 03/14/2016  ? DDD (degenerative disc disease), cervical 03/14/2016  ? Spondylosis without myelopathy or radiculopathy, lumbar region 01/31/2016  ? Anxiety and depression 06/25/2014  ? Psoriatic arthritis - sees rheumatologist 02/20/2014  ? Asthma 12/14/2008  ? Diabetic peripheral neuropathy associated with type 2 diabetes mellitus ([place]) 08/06/2008  ? Hyperlipemia 11/12/2006  ? Essential hypertension 11/12/2006  ? ? ?REFERRING DIAG: M54.2 (ICD-10-CM) - Cervicalgia M79.18 (ICD-10-CM) - Myofascial pain syndrome  ? ?THERAPY DIAG:  ?Cervicalgia ? ?Abnormal posture ? ?Other symptoms and signs involving the musculoskeletal system ? ?PERTINENT HISTORY: DM, anxiety, depression, gastric bypass, psoriatic arthritis, cervical dystonia ? ?PRECAUTIONS: None ? ?SUBJECTIVE: neck continues to hurt more and more - this has been consistent over the several months, does feel like she has more range ? ?PAIN:  ?Are you having pain? Yes: NPRS scale: 4-5/10 ?Pain location: neck, Lt side > Rt ?Pain description: tightness ?Aggravating factors: coming out of exercise motion ?Relieving factors: heat ? ? ? ? ?OBJECTIVE:  ?  ?PATIENT SURVEYS:  ?06/16/21 FOTO 45 (predicted 15) ?   ?  ?CERVICAL AROM/PROM Pain with all motions ?  ?A/PROM A ?ROM (deg) ?06/16/2021 AROM ?06/28/21  ?Flexion 18 20  ?Extension 20 25  ?Right lateral flexion 15 25  ?Left lateral flexion 22 30  ?Right rotation 40 38  ?Left  rotation 25 48  ? (Blank rows = not tested) ?  ? ?  ?TODAY'S TREATMENT:  ?07/13/21 ?Modalities: ?TENS with exercise - utilized home TENS unit to SD1 preset premod configuration to bil upper traps ?Educated on home TENS application, and location of electrodes, settings, and general TENS care ? ?Therex: ?Rows L2 x 20 reps ?Shoulder Ext L2 x 20 reps ?Bil shoulder ER L2 x 20 reps ? ? ? ?07/06/21 ?Modalities: ?TENS x 15 min with exercise - preset back with IFC configuration, intensity to tolerance - reviewed options for purchase ? ?Therex: ?Rows L2 x 20  reps ?Shoulder Ext L2 x 20 reps ?Bil shoulder ER L2 x 20 reps ?Standing cervical retraction into yellow ball 20 x 3-5 sec hold ?Seated scapular retraction 5 sec hold x 20 reps ?Seated shoulder rolls x20 reps ? ?Self-Care ?Discussed cervical dystonia and DN response.  Minimal response noted as expected due to dystonia.  Recommend trial of home TENS unit to help with symptoms, but reinforced neurologist's recommendation for Botox and to consider this as this is considered best practice.  Pt to consider. ? ?06/28/21 ?Therex: ?Seated cervical retraction 10 x 5 sec hold ?Seated cervical rotation x 10 reps bil ?Seated cervical half/full circles x 10 reps ? ?Manual: ?STM to Lt cervical paraspinals and suboccipitals; skilled palpation and monitoring of soft tissue ? ?Trigger Point Dry-Needling  ?Treatment instructions: Expect mild to moderate muscle soreness. S/S of pneumothorax if dry needled over a lung field, and to seek immediate medical attention should they occur. Patient verbalized understanding of these instructions and education. ? ?Patient Consent Given: Yes ?Education handout provided: Yes ?Muscles treated: Lt oblique capitus, cervical paraspinals, suboccipitals ?Electrical stimulation performed: No ?Parameters: N/A ?Treatment response/outcome: twitch responses noted ?   ? ?  ?PATIENT EDUCATION:  ?Education details: HEP ?Person educated: Patient ?Education method: Explanation, Demonstration, and Handouts ?Education comprehension: verbalized understanding, returned demonstration, and needs further education ?  ?  ?HOME EXERCISE PROGRAM: ?Access Code: [email or web address] ?URL: [email or web address] ?Date: 06/16/2021 ?Prepared by: Stephanie Matthews ?  ?Exercises ?Supine Chin Tuck - 2 x daily - 7 x weekly - 1 sets - 10 reps - 5 sec hold ?Half Neck Circles - 2 x daily - 7 x weekly - 1 sets - 10 reps ?Seated Scapular Retraction - 2 x daily - 7 x weekly - 1 sets - 10 reps - 5 sec hold ?Standing Backward Shoulder  Rolls - 2 x daily - 7 x weekly - 1 sets - 10 reps ?Seated Cervical Rotation AROM - 1 x daily - 7 x weekly - 1 sets - 10 reps - 5 sec hold ?Supine Cervical Rotation AROM on Pillow - 1 x daily - 7 x weekly - 1 sets - 10 reps - 5 sec hold ?Supine Suboccipital Release with Tennis Balls - 1 x daily - 7 x weekly - 3-5 min hold ?  ?Patient Education ?TENS Unit ?Trigger Point Dry Needling ?  ?ASSESSMENT: ?  ?CLINICAL IMPRESSION: ?Session focused on education of home TENS unit and application as well as determining the preferred settings. Anticipate we will be d/c next 1-2 sessions. ?  ?OBJECTIVE IMPAIRMENTS decreased ROM, decreased strength, hypomobility, increased fascial restrictions, increased muscle spasms, impaired tone, postural dysfunction, and pain.  ?  ?ACTIVITY LIMITATIONS cleaning, community activity, driving, meal prep, laundry, and shopping.  ?  ?PERSONAL FACTORS Age, Past/current experiences, and 3+ comorbidities: DM, anxiety, depression, gastric bypass, psoriatic arthritis, cervical dystonia  are also affecting patient's functional outcome.  ?  ?  ?  REHAB POTENTIAL: Good ?  ?CLINICAL DECISION MAKING: Evolving/moderate complexity ?  ?EVALUATION COMPLEXITY: Moderate ?  ?  ?GOALS: ?Goals reviewed with patient? Yes ?  ?SHORT TERM GOALS: ?  ?Independent with initial HEP ?Target date: 07/07/2021 ?Goal status: MET 07/06/21 ?  ?2.  Report neck pain < 3/10 with activity for improved function ?Target date: 07/07/2021 ?Goal status: ONGOING 07/06/21 ?  ?  ?LONG TERM GOALS: ?  ?Independent with final HEP ?Target date: 07/28/2021 ?Goal status: INITIAL ?  ?2.  FOTO score improved to 58 ?Target date: 07/28/2021 ?Goal status: INITIAL ?  ?3.  Cervical AROM improved 5 deg each direction for improved mobility and pain ?Target date: 07/28/2021 ?Goal status: INITIAL ?  ?4.  Report pain < 2/10 with activity for improved function ?Target date: 07/28/2021 ?Goal status: INITIAL ?  ?  ?PLAN: ?PT FREQUENCY: 1x/week ?  ?PT DURATION: 6 weeks ?   ?PLANNED INTERVENTIONS: Therapeutic exercises, Therapeutic activity, Neuromuscular re-education, Patient/Family education, Joint mobilization, Dry Needling, Electrical stimulation, Spinal mobilization, Cr

## 2021-07-20 ENCOUNTER — Ambulatory Visit (INDEPENDENT_AMBULATORY_CARE_PROVIDER_SITE_OTHER): Payer: Medicare Other | Admitting: Physical Therapy

## 2021-07-20 ENCOUNTER — Encounter: Payer: Self-pay | Admitting: Physical Therapy

## 2021-07-20 DIAGNOSIS — R293 Abnormal posture: Secondary | ICD-10-CM

## 2021-07-20 DIAGNOSIS — R29898 Other symptoms and signs involving the musculoskeletal system: Secondary | ICD-10-CM | POA: Diagnosis not present

## 2021-07-20 DIAGNOSIS — M542 Cervicalgia: Secondary | ICD-10-CM | POA: Diagnosis not present

## 2021-07-20 NOTE — Therapy (Signed)
?OUTPATIENT PHYSICAL THERAPY TREATMENT NOTE ?DISCHARGE SUMMARY ? ? ?Patient Name: Priscilla [name] ?MRN: [id number] ?DOB:26-Mar-1951, 71 y.o., female ?Today's Date: 07/20/2021 ? ?PCP: Ma Hillock, DO ?REFERRING PROVIDER: Magnus Sinning, MD ? ? PT End of Session - 07/20/21 1000   ? ? Visit Number 5   ? PT Start Time 0930   ? PT Stop Time 1000   ? PT Time Calculation (min) 30 min   ? Activity Tolerance Patient tolerated treatment well   ? Behavior During Therapy [facility] for tasks assessed/performed   ? ?  ?  ? ?  ? ? ? ? ? ?Past Medical History:  ?Diagnosis Date  ? Anemia 12/14/2008  ? Anxiety and depression 12/14/2008  ? Asthma 11/12/2006  ? CAD (coronary artery disease) 12/14/2008  ? hx transluminal coronary angioplasty  ? Cataract   ? Depression   ? Diabetes mellitus with neuropathy ([place]) 12/14/2008  ? Fibromyalgia 01/04/2007  ? sees rheuamtology  ? Gastric bypass status for obesity 08/31/2016  ? Hyperlipemia 01/21/2010  ? Hypertension 12/14/2008  ? Leg edema 12/14/2008  ? 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 OSA (obstructive sleep apnea) 08/06/2008  ? Osteoarthritis 12/14/2008  ? Palpitations 11/12/2006  ? hx sinus tachy  ? Psoriatic arthritis ([place])   ? sees rheumatology  ? Rotator cuff injury   ? ?Past Surgical History:  ?Procedure Laterality Date  ? ABDOMINAL HYSTERECTOMY    ? CHOLECYSTECTOMY    ? LAPAROSCOPIC GASTRIC BANDING    ? Lumbar Facet Joint Intra-Articular Injection(s) with Fluoroscopic Guidance  09/13/2020  ? L4-5; Dr. Ernestina Patches  ? PTCA    ? WRIST SURGERY    ? LEFT  ? ?Patient Active Problem List  ? Diagnosis Date Noted  ? Decreased circulation right great toe 05/28/2020  ? Hip pain 11/27/2019  ? Type 2 diabetes mellitus with retinopathy, with long-term current use of insulin ([place]) 05/17/2018  ? Overweight (BMI 25.0-29.9) 02/11/2018  ? Vitamin D deficiency 10/13/2016  ? 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 Gastric bypass status for obesity 08/31/2016  ? Iron deficiency anemia 04/28/2016  ? Other insomnia 03/14/2016  ? DDD  (degenerative disc disease), cervical 03/14/2016  ? Spondylosis without myelopathy or radiculopathy, lumbar region 01/31/2016  ? Anxiety and depression 06/25/2014  ? Psoriatic arthritis - sees rheumatologist 02/20/2014  ? Asthma 12/14/2008  ? Diabetic peripheral neuropathy associated with type 2 diabetes mellitus ([place]) 08/06/2008  ? Hyperlipemia 11/12/2006  ? Essential hypertension 11/12/2006  ? ? ?REFERRING DIAG: M54.2 (ICD-10-CM) - Cervicalgia M79.18 (ICD-10-CM) - Myofascial pain syndrome  ? ?THERAPY DIAG:  ?Cervicalgia ? ?Abnormal posture ? ?Other symptoms and signs involving the musculoskeletal system ? ?PERTINENT HISTORY: DM, anxiety, depression, gastric bypass, psoriatic arthritis, cervical dystonia ? ?PRECAUTIONS: None ? ?SUBJECTIVE: sees Dr. Ernestina Patches in the AM to discuss the neck.  Pain is increased and neck feels swollen.   Increased trouble sleeping over past few nights ?PAIN:  ?Are you having pain? Yes: NPRS scale: 4-6/10 ?Pain location: neck, Lt side > Rt ?Pain description: tightness ?Aggravating factors: coming out of exercise motion ?Relieving factors: heat ? ? ? ? ?OBJECTIVE:  ?  ?PATIENT SURVEYS:  ?06/16/21 FOTO 45 (predicted 21) ?07/20/21 FOTO 37 ?   ?  ?CERVICAL AROM/PROM Pain with all motions ?  ?A/PROM A ?ROM (deg) ?06/16/2021 AROM ?06/28/21 AROM ?07/20/21  ?Flexion '18 20 28  ' ?Extension '20 25 11  ' ?Right lateral flexion '15 25 22  ' ?Left lateral flexion '22 30 18  ' ?Right rotation 40 38 54  ?  Left rotation 25 48 45  ? (Blank rows = not tested) ?  ? ?  ?TODAY'S TREATMENT:  ?07/20/21 ?Discussed current progress and reviewed ROM measurements with pt.  Feel at this time due to increasing pain and limited functional changes with decrease in FOTO score, will d/c PT and refer back to MD.  She sees NP tomorrow to discuss next options.  Reinforced continued exercise and use of TENS PRN. ? ? ?07/13/21 ?Modalities: ?TENS with exercise - utilized home TENS unit to SD1 preset premod configuration to bil upper traps ?Educated  on home TENS application, and location of electrodes, settings, and general TENS care ? ?Therex: ?Rows L2 x 20 reps ?Shoulder Ext L2 x 20 reps ?Bil shoulder ER L2 x 20 reps ? ? ? ?07/06/21 ?Modalities: ?TENS x 15 min with exercise - preset back with IFC configuration, intensity to tolerance - reviewed options for purchase ? ?Therex: ?Rows L2 x 20 reps ?Shoulder Ext L2 x 20 reps ?Bil shoulder ER L2 x 20 reps ?Standing cervical retraction into yellow ball 20 x 3-5 sec hold ?Seated scapular retraction 5 sec hold x 20 reps ?Seated shoulder rolls x20 reps ? ?Self-Care ?Discussed cervical dystonia and DN response.  Minimal response noted as expected due to dystonia.  Recommend trial of home TENS unit to help with symptoms, but reinforced neurologist's recommendation for Botox and to consider this as this is considered best practice.  Pt to consider. ? ?06/28/21 ?Therex: ?Seated cervical retraction 10 x 5 sec hold ?Seated cervical rotation x 10 reps bil ?Seated cervical half/full circles x 10 reps ? ?Manual: ?STM to Lt cervical paraspinals and suboccipitals; skilled palpation and monitoring of soft tissue ? ?Trigger Point Dry-Needling  ?Treatment instructions: Expect mild to moderate muscle soreness. S/S of pneumothorax if dry needled over a lung field, and to seek immediate medical attention should they occur. Patient verbalized understanding of these instructions and education. ? ?Patient Consent Given: Yes ?Education handout provided: Yes ?Muscles treated: Lt oblique capitus, cervical paraspinals, suboccipitals ?Electrical stimulation performed: No ?Parameters: N/A ?Treatment response/outcome: twitch responses noted ?   ? ?  ?PATIENT EDUCATION:  ?Education details: HEP ?Person educated: Patient ?Education method: Explanation, Demonstration, and Handouts ?Education comprehension: verbalized understanding, returned demonstration, and needs further education ?  ?  ?HOME EXERCISE PROGRAM: ?Access Code: [email or web address] ?URL:  [email or web address] ?Date: 06/16/2021 ?Prepared by: Faustino Congress ?  ?Exercises ?Supine Chin Tuck - 2 x daily - 7 x weekly - 1 sets - 10 reps - 5 sec hold ?Half Neck Circles - 2 x daily - 7 x weekly - 1 sets - 10 reps ?Seated Scapular Retraction - 2 x daily - 7 x weekly - 1 sets - 10 reps - 5 sec hold ?Standing Backward Shoulder Rolls - 2 x daily - 7 x weekly - 1 sets - 10 reps ?Seated Cervical Rotation AROM - 1 x daily - 7 x weekly - 1 sets - 10 reps - 5 sec hold ?Supine Cervical Rotation AROM on Pillow - 1 x daily - 7 x weekly - 1 sets - 10 reps - 5 sec hold ?Supine Suboccipital Release with Tennis Balls - 1 x daily - 7 x weekly - 3-5 min hold ?  ?Patient Education ?TENS Unit ?Trigger Point Dry Needling ?  ?ASSESSMENT: ?  ?CLINICAL IMPRESSION: ?Pt has only met 1 LTG at this time and partially met ROM goal.  Overall her pain continues to be increased and has not had significant relief from PT.  Will  refer back to MD at this time. ? ?OBJECTIVE IMPAIRMENTS decreased ROM, decreased strength, hypomobility, increased fascial restrictions, increased muscle spasms, impaired tone, postural dysfunction, and pain.  ?  ?ACTIVITY LIMITATIONS cleaning, community activity, driving, meal prep, laundry, and shopping.  ?  ?PERSONAL FACTORS Age, Past/current experiences, and 3+ comorbidities: DM, anxiety, depression, gastric bypass, psoriatic arthritis, cervical dystonia  are also affecting patient's functional outcome.  ?  ?  ?REHAB POTENTIAL: Good ?  ?CLINICAL DECISION MAKING: Evolving/moderate complexity ?  ?EVALUATION COMPLEXITY: Moderate ?  ?  ?GOALS: ?Goals reviewed with patient? Yes ?  ?SHORT TERM GOALS: ?  ?Independent with initial HEP ?Target date: 07/07/2021 ?Goal status: MET 07/06/21 ?  ?2.  Report neck pain < 3/10 with activity for improved function ?Target date: 07/07/2021 ?Goal status: ONGOING 07/06/21 ?  ?  ?LONG TERM GOALS: ?  ?Independent with final HEP ?Target date: 07/28/2021 ?Goal status: MET  07/20/21 ?  ?2.  FOTO score improved to 58 ?Target date: 07/28/2021 ?Goal status: NOT MET 07/20/21 ?  ?3.  Cervical AROM improved 5 deg each direction for improved mobility and pain ?Target date: 07/28/2021 ?Goal status: PA

## 2021-07-21 ENCOUNTER — Encounter: Payer: Self-pay | Admitting: Physical Medicine and Rehabilitation

## 2021-07-21 ENCOUNTER — Ambulatory Visit (INDEPENDENT_AMBULATORY_CARE_PROVIDER_SITE_OTHER): Payer: Medicare Other | Admitting: Physical Medicine and Rehabilitation

## 2021-07-21 VITALS — BP 133/84 | HR 70

## 2021-07-21 DIAGNOSIS — M542 Cervicalgia: Secondary | ICD-10-CM | POA: Diagnosis not present

## 2021-07-21 DIAGNOSIS — G243 Spasmodic torticollis: Secondary | ICD-10-CM

## 2021-07-21 DIAGNOSIS — M47812 Spondylosis without myelopathy or radiculopathy, cervical region: Secondary | ICD-10-CM

## 2021-07-21 DIAGNOSIS — M797 Fibromyalgia: Secondary | ICD-10-CM

## 2021-07-21 DIAGNOSIS — M7918 Myalgia, other site: Secondary | ICD-10-CM | POA: Diagnosis not present
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
## 2021-07-21 NOTE — Progress Notes (Signed)
Pt state neck pain that travels up the left side of her head and down to her left shoulder. Pt state she gets headaches really bad.Pt state turning her head and bending over makes the pain worse. Pt state she takes over the counter pain meds and takes PT to help ease her pain. ? ?Numeric Pain Rating Scale and Functional Assessment ?Average Pain 7 ?Pain Right Now 2 ?My pain is constant, dull, and aching ?Pain is worse with: bending and some activites ?Pain improves with: therapy/exercise and medication ? ? ?In the last MONTH (on 0-10 scale) has pain interfered with the following? ? ?1. General activity like being  able to carry out your everyday physical activities such as walking, climbing stairs, carrying groceries, or moving a chair?  ?Rating(5) ? ?2. Relation with others like being able to carry out your usual social activities and roles such as  activities at home, at work and in your community. ?Rating(6) ? ?3. Enjoyment of life such that you have  been bothered by emotional problems such as feeling anxious, depressed or irritable?  ?Rating(7) ? ?

## 2021-07-26 ENCOUNTER — Encounter: Payer: Self-pay | Admitting: Hematology & Oncology

## 2021-07-27 ENCOUNTER — Ambulatory Visit (HOSPITAL_BASED_OUTPATIENT_CLINIC_OR_DEPARTMENT_OTHER)
Admission: RE | Admit: 2021-07-27 | Discharge: 2021-07-27 | Disposition: A | Discharge status: Home / Self Care | Payer: Medicare Other | Source: Ambulatory Visit | Attending: Physical Medicine and Rehabilitation | Admitting: Physical Medicine and Rehabilitation

## 2021-07-27 ENCOUNTER — Encounter: Payer: Medicare Other | Admitting: Physical Therapy

## 2021-07-27 DIAGNOSIS — M542 Cervicalgia: Secondary | ICD-10-CM | POA: Insufficient documentation

## 2021-07-28 ENCOUNTER — Ambulatory Visit: Payer: Medicare Other | Admitting: Internal Medicine

## 2021-08-07 ENCOUNTER — Other Ambulatory Visit: Payer: Self-pay | Admitting: Rheumatology

## 2021-08-08 ENCOUNTER — Telehealth: Payer: Self-pay | Admitting: *Deleted

## 2021-08-08 NOTE — Telephone Encounter (Signed)
Next Visit: 10/31/2021 ?  ?Last Visit: 05/02/2021 ?  ?Last Fill: 07/05/2021 ?  ?Dx: Other insomnia  ?  ?Current Dose per office note on 05/02/2021: Ambien 5 mg 1 tablet by mouth at bedtime for insomnia ?  ?Okay to refill Ambien?  ?

## 2021-08-08 NOTE — Telephone Encounter (Signed)
Submitted a Prior Authorization request to  [facility]  for  Ambien  via CoverMyMeds. Will update once we receive a response. ?  ?

## 2021-08-08 NOTE — Telephone Encounter (Signed)
Received notification from  [facility]  regarding a prior authorization for  Ambien . Authorization has been APPROVED from 07/25/2021 to until further notice.  ? ? ?

## 2021-08-09 ENCOUNTER — Encounter: Payer: Self-pay | Admitting: Internal Medicine

## 2021-08-09 ENCOUNTER — Ambulatory Visit (INDEPENDENT_AMBULATORY_CARE_PROVIDER_SITE_OTHER): Payer: Medicare Other | Admitting: Internal Medicine

## 2021-08-09 VITALS — BP 140/88 | HR 66 | Ht 64.0 in | Wt 157.0 lb

## 2021-08-09 DIAGNOSIS — E785 Hyperlipidemia, unspecified: Secondary | ICD-10-CM

## 2021-08-09 DIAGNOSIS — Z794 Long term (current) use of insulin: Secondary | ICD-10-CM

## 2021-08-09 DIAGNOSIS — E1142 Type 2 diabetes mellitus with diabetic polyneuropathy: Secondary | ICD-10-CM | POA: Diagnosis not present

## 2021-08-09 DIAGNOSIS — E11319 Type 2 diabetes mellitus with unspecified diabetic retinopathy without macular edema: Secondary | ICD-10-CM

## 2021-08-09 LAB — POCT GLYCOSYLATED HEMOGLOBIN (HGB A1C): Hemoglobin A1C: 7 % — AB (ref 4.0–5.6)

## 2021-08-09 NOTE — Progress Notes (Signed)
Patient ID: Priscilla [name], female   DOB: 1950-07-27, 71 y.o.   MRN: [id number] ? ?This visit occurred during the SARS-CoV-2 public health emergency.  Safety protocols were in place, including screening questions prior to the visit, additional usage of staff PPE, and extensive cleaning of exam room while observing appropriate contact time as indicated for disinfecting solutions.  ? 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 She previously saw Drs Dwyane Dee (distant past) and [place] (more recently). Last visit with me 5 months ago. ? ?Interim history: ?She has a lot of stress at home due to her husband who has vascular dementia. ?No increased urination, blurry vision, nausea, chest pain. ?She has neck pain. She has not been very active as she is dizzy. She saw Dr. Carles Collet ([place]. Cervical distonia).  ? ?Reviewed HbA1c levels: ?Lab Results  ?Component Value Date  ? HGBA1C 6.6 (A) 03/07/2021  ? HGBA1C 6.7 (A) 10/29/2020  ? HGBA1C 6.2 (A) 06/07/2020  ? HGBA1C 6.3 (A) 12/30/2019  ? HGBA1C 6.3 (A) 08/25/2019  ? HGBA1C 5.9 (A) 04/24/2019  ? HGBA1C 6.0 (A) 02/13/2019  ? HGBA1C 6.1 (A) 09/20/2018  ? HGBA1C 6.4 (A) 05/23/2018  ? HGBA1C 6.0 (A) 10/17/2017  ? HGBA1C 6.3 06/13/2017  ? HGBA1C 5.9 11/24/2016  ? HGBA1C 6.5 (H) 04/22/2016  ? HGBA1C 7.4 05/18/2015  ? HGBA1C 7.3 (A) 12/10/2014  ?03/16/2017: HbA1c calculated from fructosamine: 5.6% ?10/18/2015: HbA1c calculated from fructosamine: 5.7% ?02/2015: HbA1c 7.4%, HbA1c calculated from fructosamine: 5.6% ? ?She is on: ?- Metformin 1000 mg 2x a day with meals ?- Levemir 30 >> .Marland Kitchen. 15 >> 13 >> 10-13 >> 13-15units at bedtime ?- Mealtime Novolog:  (3) 4-5 >> 4-6 before meals ?If sugars before the meal are 60 or lower, please do not take the Novolog dose. ?If sugars before the meal are 61-80, take only half of the Novolog dose. ?She was on Victoza 2.5 mg  daily >> stopped as she could not afford this. ? ?Pt.checks her sugars more than 4 times a day with her CGM: ? ? ?Previously: ? ? ?Previously: ? ?Lowest sugar was 42 >> 60 >> 60 >> 57 >> 59; she has hypoglycemia awareness in the in the 70s. ?Highest sugar was 285 x1 (cake) ... >> 300 (steroids) >> 258 >> 250. ? ?Glucometer: Freestyle ? ?-No CKD; last BUN/creatinine:  ?Lab Results  ?Component Value Date  ? BUN 26 (H) 05/06/2021  ? CREATININE 0.76 05/06/2021  ?ACR (12/2014): 6.9 ?On lisinopril 1.25 mg daily ? ?-+ HL; last set of lipids: ?Lab Results  ?Component Value Date  ? CHOL 215 (H) 05/06/2021  ? HDL 60.70 05/06/2021  ? LDLCALC 131 (H) 05/06/2021  ? TRIG 114.0 05/06/2021  ? CHOLHDL 4 05/06/2021  ?On atorvastatin 20 >> pravastatin 40. ? 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  She has floaters. ? ?-+ numbness, tingling, burning in her feet - stable.  On B12.  I recommended alpha lipoic acid but she did not take this due to the large size of the pill.  Previously on Neurontin 100 mg daily per podiatry, now seldom, 2/2 being also on Ambien. ? ?Latest TSH normal: ?Lab Results  ?Component Value Date  ? TSH 2.00 05/06/2021  ? ?She has a history of lap band surgery >> she does  not usually eat large meals but she usually grazes, especially at night ? ?ROS: ?Neurological: + tremors/+ numbness/+ tingling/no dizziness ? ?I reviewed pt's medications, allergies, PMH, social hx, family hx, and changes were documented in the history of present illness. Otherwise, unchanged from my initial visit note. ? ?Past Medical History:  ?Diagnosis Date  ? Anemia 12/14/2008  ? Anxiety and depression 12/14/2008  ? Asthma 11/12/2006  ? CAD (coronary artery disease) 12/14/2008  ? hx transluminal coronary angioplasty  ? Cataract   ? Depression   ? Diabetes mellitus with neuropathy ([place]) 12/14/2008  ? Fibromyalgia 01/04/2007  ? sees rheuamtology  ? Gastric bypass  status for obesity 08/31/2016  ? Hyperlipemia 01/21/2010  ? Hypertension 12/14/2008  ? Leg edema 12/14/2008  ? 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 OSA (obstructive sleep apnea) 08/06/2008  ? Osteoarthritis 12/14/2008  ? Palpitations 11/12/2006  ? hx sinus tachy  ? Psoriatic arthritis ([place])   ? sees rheumatology  ? Rotator cuff injury   ? ?Past Surgical History:  ?Procedure Laterality Date  ? ABDOMINAL HYSTERECTOMY    ? CHOLECYSTECTOMY    ? LAPAROSCOPIC GASTRIC BANDING    ? Lumbar Facet Joint Intra-Articular Injection(s) with Fluoroscopic Guidance  09/13/2020  ? L4-5; Dr. Ernestina Patches  ? PTCA    ? WRIST SURGERY    ? LEFT  ? ?Social History  ? ?Social History  ? Marital Status: Married  ?  Spouse Name: N/A  ? Number of Children: N/A  ? ?Occupational History  ? Not on file.  ? ?Social History Main Topics  ? Smoking status: Never Smoker   ? Smokeless tobacco: Never Used  ? Alcohol Use: Yes  ?   Comment: glass of wine-specially occasion  ? Drug Use: No  ? ?Current Outpatient Medications on File Prior to Visit  ?Medication Sig Dispense Refill  ? zolpidem (AMBIEN) 5 MG tablet TAKE 1 TABLET(5 MG) BY MOUTH AT BEDTIME AS NEEDED FOR SLEEP 30 tablet 0  ? aspirin 81 MG tablet Take 81 mg by mouth daily.    ? atorvastatin (LIPITOR) 40 MG tablet Take 1 tablet (40 mg total) by mouth daily. 90 tablet 3  ? calcium-vitamin D 250-100 MG-UNIT per tablet Take 1 tablet by mouth daily.     ? COLLAGEN PO Take by mouth. Adds to tea daily    ? Continuous Blood Gluc Receiver (FREESTYLE LIBRE 2 READER) DEVI Use as instructed to check blood sugar 4X daily 1 each 0  ? Continuous Blood Gluc Sensor (FREESTYLE LIBRE 2 SENSOR) MISC Use sensors with CGM to check sugars. 6 each 3  ? Cyanocobalamin (VITAMIN B12 PO) Place under the tongue.    ? DULoxetine (CYMBALTA) 60 MG capsule Take 1 capsule (60 mg total) by mouth daily. 30 capsule 5  ? gabapentin (NEURONTIN) 300 MG capsule Take 1 capsule (300 mg total) by mouth at bedtime. 90 capsule 1  ? glucose blood (ONETOUCH  VERIO) test strip USE TO TEST BLOOD SUGAR TWICE A DAY DX: E11.319 150 each 3  ? insulin detemir (LEVEMIR) 100 UNIT/ML injection Inject 0.1-0.13 mLs (10-13 Units total) into the skin at bedtime. 30 mL 3  ? Insulin Pen Needle 32G X 4 MM MISC Use 3x a day for insulin 300 each 3  ? Insulin Syringe-Needle U-100 (BD INSULIN SYRINGE U/F) 31G X 5/16" 0.5 ML MISC Use 1x a day for insulin 100 each 3  ? metFORMIN (GLUCOPHAGE) 1000 MG tablet TAKE 1 TABLET BY MOUTH TWICE DAILY WITH MEALS 180 tablet  3  ? metoprolol tartrate (LOPRESSOR) 25 MG tablet TAKE 1 TABLET(25 MG) BY MOUTH TWICE DAILY 60 tablet 5  ? NOVOLOG FLEXPEN 100 UNIT/ML FlexPen Inject 2-4 Units into the skin 3 (three) times daily with meals. 15 mL 2  ? pravastatin (PRAVACHOL) 40 MG tablet Take 1 tablet (40 mg total) by mouth daily. 30 tablet 11  ? sertraline (ZOLOFT) 100 MG tablet Take 1 tablet (100 mg total) by mouth daily. 30 tablet 5  ? tiZANidine (ZANAFLEX) 4 MG tablet TAKE 1 TABLET(4 MG) BY MOUTH EVERY 8 HOURS AS NEEDED FOR MUSCLE SPASMS 30 tablet 1  ? ?No current facility-administered medications on file prior to visit.  ? ?No Known Allergies ?Family History  ?Problem Relation Age of Onset  ? Arthritis Mother   ? Alzheimer's disease Mother   ? Stroke Father   ? Alcohol abuse Father   ? Diabetes Father   ? Heart disease Father   ? Early death Father 72  ? Diabetes Sister   ? Arthritis Sister   ? COPD Sister   ? Arthritis Maternal Aunt   ? Diabetes Maternal Aunt   ? Early death Maternal Aunt 29  ? Diabetes Maternal Uncle   ? Cancer Paternal Aunt   ? COPD Paternal Aunt   ? Heart disease Paternal Aunt   ? Heart disease Paternal Uncle   ? Alcohol abuse Paternal Grandfather   ? Heart disease Paternal Grandfather   ? Early death Paternal Grandfather 32  ? Stroke Paternal Grandfather   ? Hypertension Son   ? Autoimmune disease Daughter   ? Breast cancer Neg Hx   ? ?PE: ?BP 140/88 (BP Location: Right Arm, Patient Position: Sitting, Cuff Size: Normal)   Pulse 66   Ht 5'  4" (1.626 m)   Wt 157 lb (71.2 kg)   LMP  (LMP Unknown)   SpO2 99%   BMI 26.95 kg/m?  Body mass index is 26.95 kg/m?. ?Wt Readings from Last 3 Encounters:  ?08/09/21 157 lb (71.2 kg)  ?05/06/21 156 lb 6.4 oz (

## 2021-08-09 NOTE — Patient Instructions (Signed)
Please continue: ?- Metformin 1000 mg 2x a day with meals ?- Levemir 13-15 units daily ? ?Use: ?- Novolog 4-6 units 15 minutes before meals, but 7-8 units before a large meal or if eating out ? ?Please come back for a follow-up appointment in 4 months. ?

## 2021-08-10 ENCOUNTER — Ambulatory Visit (INDEPENDENT_AMBULATORY_CARE_PROVIDER_SITE_OTHER): Payer: Medicare Other | Admitting: Physical Medicine and Rehabilitation

## 2021-08-10 ENCOUNTER — Telehealth: Payer: Self-pay | Admitting: Neurology

## 2021-08-10 ENCOUNTER — Encounter: Payer: Self-pay | Admitting: Physical Medicine and Rehabilitation

## 2021-08-10 VITALS — BP 165/78 | HR 79

## 2021-08-10 DIAGNOSIS — M797 Fibromyalgia: Secondary | ICD-10-CM | POA: Diagnosis not present

## 2021-08-10 DIAGNOSIS — G243 Spasmodic torticollis: Secondary | ICD-10-CM

## 2021-08-10 DIAGNOSIS — M47812 Spondylosis without myelopathy or radiculopathy, cervical region: Secondary | ICD-10-CM | POA: Diagnosis not present

## 2021-08-10 DIAGNOSIS — M542 Cervicalgia: Secondary | ICD-10-CM

## 2021-08-10 NOTE — Progress Notes (Signed)
Pt state neck pain that travels to her left side of her head and shoulder. Pt state any movement with her neck makes the pain worse.Pt state she has dizziness when getting out of bed. Pt state she takes over the counter pain meds to help ease her pain. Pt here to review cervical MRI. ? ?Numeric Pain Rating Scale and Functional Assessment ?Average Pain 7 ?Pain Right Now 4 ?My pain is intermittent, sharp, dull, stabbing, and aching ?Pain is worse with: sitting, some activites, and laying down ?Pain improves with: rest, medication, and injections ? ? ?In the last MONTH (on 0-10 scale) has pain interfered with the following? ? ?1. General activity like being  able to carry out your everyday physical activities such as walking, climbing stairs, carrying groceries, or moving a chair?  ?Rating(5) ? ?2. Relation with others like being able to carry out your usual social activities and roles such as  activities at home, at work and in your community. ?Rating(6) ? ?3. Enjoyment of life such that you have  been bothered by emotional problems such as feeling anxious, depressed or irritable?  ?Rating(7) ? ?

## 2021-08-10 NOTE — Telephone Encounter (Signed)
Pt called in stating Dr. Ernestina Patches wanted to see if Dr. Carles Collet could look at the MRI done in April. She was told Botox injections was one of the options that could treat her, but wanted to see what Dr. Carles Collet recommended. ?

## 2021-08-10 NOTE — Progress Notes (Signed)
? ?Priscilla [name] - 71 y.o. female MRN [id number]  Date of birth: 18-Oct-1950 ? ?Office Visit Note: ?Visit Date: 08/10/2021 ?PCP: Howard Pouch A, DO ?Referred by: Ma Hillock, DO ? ?Subjective: ?Chief Complaint  ?Patient presents with  ? Neck - Pain  ? Head - Pain, Headache  ? Left Shoulder - Pain  ? ?HPI: Priscilla [name] is a 71 y.o. female who comes in today for evaluation of chronic, worsening and severe left sided neck pain. Patient reports pain has been ongoing for several months and is exacerbated by movement and activity, she describes pain as constant sore and aching sensation, she also reports frequent "popping" and "grinding" noises when rotating head. She reports most severe pain is with side to side rotation. Patient currently rates pain as 8 out of 10. She reports some relief of pain with home exercise regimen, rest, use of TENS unit and over the counter medications as needed. Patients recent cervical MRI exhibits multi-level facet arthropathy most notable on left at C1-C2. No high grade spinal canal stenosis. Patient was recently evaluated by Dr. Wells Guiles Tat at [facility] where she was diagnosed with cervical dystonia, Dr. Carles Collet did recommend treatment with Botox at that time. Patient states her pain is starting to negatively impact her daily life and would like to discuss treatment options today. Patient denies focal weakness, numbness and tingling. Patient denies recent trauma or falls.  ? ? ?Review of Systems  ?Musculoskeletal:  Positive for neck pain.  ?Neurological:  Negative for tingling, sensory change, focal weakness and weakness.  ?All other systems reviewed and are negative. Otherwise per HPI. ? ?Assessment & Plan: ?Visit Diagnoses:  ?  ICD-10-CM   ?1. Cervicalgia  M54.2   ?  ?2. 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  ?3. Neck pain on left side  M54.2   ?  ?4. Facet hypertrophy of cervical region  M47.812   ?  ?5. Fibromyalgia  M79.7   ?  ?   ?Plan: Findings:  ?Chronic, worsening and  severe left sided neck pain. No radicular symptoms noted. Patient continues to have severe pain despite good conservative therapies such as home exercise regimen, rest, use of TENS unit and medications. I did discuss patients recent cervical MRI  using images and spine model. Patients clinical presentation and exam continue to be consistent with both both facet medicated pain and cervical dystonia. We did discuss possible left C1-C2 facet joint injection, however this type of injection is considered high risk, especially from an anatomical standpoint as the vertebral artery is nearby. We do not typically perform this procedure in our office. We are unsure if other providers in the area perform this type of injection, would be something to look into if she chooses to move forward with this procedure. Patient would like to hold on cervical facet injection as she does feel this is a risky procedure, she would like to move forward with Botox injections. Patient instructed to follow up with Dr. Carles Collet to discuss procedure in further detail and answer any questions she may have. Patient instructed to follow up with [place] as needed. No red flag symptoms noted upon exam today.   ? ?Meds & Orders: No orders of the defined types were placed in this encounter. ? No orders of the defined types were placed in this encounter. ?  ?Follow-up: Return if symptoms worsen or fail to improve.  ? ?Procedures: ?No procedures performed  ?   ? ?Clinical History: ?EXAM: ?MRI  CERVICAL SPINE WITHOUT CONTRAST ?  ?TECHNIQUE: ?Multiplanar, multisequence MR imaging of the cervical spine was ?performed. No intravenous contrast was administered. ?  ?COMPARISON:  CT cervical 01/28/2021; X-ray cervical 06/04/2019. ?  ?FINDINGS: ?Alignment: Physiologic. ?  ?Vertebrae: No fracture, evidence of discitis, or bone lesion. Marrow ?edema at the left C1-2 facet. STIR hyperintensity on both sides of ?the right C6-7 facet is likely subchondral cystic change. ?   ?Cord: Normal signal and morphology. ?  ?Posterior Fossa, vertebral arteries, paraspinal tissues: Negative ?for perispinal mass or inflammation ?  ?Disc levels: ?  ?C1-2: Retro dental ligamentous thickening and atlantal dental ?degeneration. Left more than right degenerative facet spurring with ?joint space narrowing and spurring potentially affecting the left C2 ?nerve root. ?  ?C2-3: Degenerative facet spurring asymmetric to the left. No neural ?impingement ?  ?C3-4: Disc narrowing and bulging with bilateral uncovertebral ?ridging. Advanced bilateral facet spurring and bilateral foraminal ?impingement. Small bilateral paracentral disc protrusion and ridging ?without neural compression ?  ?C4-5: Disc narrowing with asymmetric leftward bulging and left bulky ?uncovertebral spurring with underlying protrusion. Asymmetric left ?facet spurring. Advanced left foraminal impingement ?  ?C5-6: Disc narrowing and bulging with uncovertebral and facet ?spurring asymmetric to the left where there is moderate foraminal ?stenosis. ?  ?C6-7: Disc narrowing and bulging with bilateral uncovertebral ?spurring and foraminal impingement. ?  ?C7-T1:Small bilateral foraminal protrusion. Negative facets. Mild ?right foraminal narrowing based on sagittal images. ?  ?IMPRESSION: ?1. Generalized cervical spine degeneration with notable active facet ?arthritis on the left at C1-2. ?2. Degenerative foraminal impingement seen on the left at C3-4 to ?C6-7 and on the right at C3-4 and C6-7. Potential left C2 ?impingement related to #1. ?3. Diffusely patent spinal canal. ?  ?  ?Electronically Signed ?  By: Jorje Guild M.D. ?  On: 07/27/2021 10:32  ? ?She reports that she has never smoked. She has never used smokeless tobacco.  ?Recent Labs  ?  10/29/20 ?4967 03/07/21 ?5916 08/09/21 ?0840  ?HGBA1C 6.7* 6.6* 7.0*  ? ? ?Objective:  VS:  HT:    WT:   BMI:     BP: ?(!) 165/78  HR:79bpm  TEMP: ( )  RESP:  ?Physical Exam ?Vitals and nursing  note reviewed.  ?HENT:  ?   Head: Normocephalic and atraumatic.  ?   Right Ear: External ear normal.  ?   Left Ear: External ear normal.  ?   Nose: Nose normal.  ?   Mouth/Throat:  ?   Mouth: Mucous membranes are moist.  ?Eyes:  ?   Extraocular Movements: Extraocular movements intact.  ?Cardiovascular:  ?   Rate and Rhythm: Normal rate and regular rhythm.  ?   Pulses: Normal pulses.  ?Pulmonary:  ?   Effort: Pulmonary effort is normal.  ?Abdominal:  ?   General: Abdomen is flat. There is no distension.  ?Musculoskeletal:     ?   General: Tenderness present.  ?   Cervical back: Tenderness present.  ?   Comments: Discomfort noted with flexion, extension and side-to-side rotation. Patient has good strength in the upper extremities including 5 out of 5 strength in wrist extension, long finger flexion and APB. There is no atrophy of the hands intrinsically. Sensation intact bilaterally. Negative Hoffman's sign.    ?Skin: ?   General: Skin is warm and dry.  ?   Capillary Refill: Capillary refill takes less than 2 seconds.  ?Neurological:  ?   General: No focal deficit present.  ?  Mental Status: She is alert and oriented to person, place, and time.  ?Psychiatric:     ?   Mood and Affect: Mood normal.  ?  ?Ortho Exam ? ?Imaging: ?No results found. ? ?Past Medical/Family/Surgical/Social History: ?Medications & Allergies reviewed per EMR, new medications updated. ?Patient Active Problem List  ? Diagnosis Date Noted  ? Decreased circulation right great toe 05/28/2020  ? Hip pain 11/27/2019  ? Type 2 diabetes mellitus with retinopathy, with long-term current use of insulin ([place]) 05/17/2018  ? Overweight (BMI 25.0-29.9) 02/11/2018  ? Vitamin D deficiency 10/13/2016  ? 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 Gastric bypass status for obesity 08/31/2016  ? Iron deficiency anemia 04/28/2016  ? Other insomnia 03/14/2016  ? DDD (degenerative disc disease), cervical 03/14/2016  ? Spondylosis without myelopathy or radiculopathy, lumbar  region 01/31/2016  ? Anxiety and depression 06/25/2014  ? Psoriatic arthritis - sees rheumatologist 02/20/2014  ? Asthma 12/14/2008  ? Diabetic peripheral neuropathy associated with type 2 diabetes mellitus ([place]) 04/29

## 2021-08-11 NOTE — Telephone Encounter (Signed)
Called patient and no voicemail was available to leave message for this patient  ?

## 2021-08-15 ENCOUNTER — Ambulatory Visit (INDEPENDENT_AMBULATORY_CARE_PROVIDER_SITE_OTHER): Payer: Medicare Other | Admitting: Family Medicine

## 2021-08-15 ENCOUNTER — Encounter: Payer: Self-pay | Admitting: Family Medicine

## 2021-08-15 VITALS — BP 112/69 | HR 67 | Temp 98.5°F | Ht 64.0 in | Wt 157.0 lb

## 2021-08-15 DIAGNOSIS — M542 Cervicalgia: Secondary | ICD-10-CM | POA: Diagnosis not present

## 2021-08-15 DIAGNOSIS — R591 Generalized enlarged lymph nodes: Secondary | ICD-10-CM

## 2021-08-15 DIAGNOSIS — G243 Spasmodic torticollis: Secondary | ICD-10-CM | POA: Diagnosis not present

## 2021-08-15 MED ORDER — TIZANIDINE HCL 4 MG PO TABS: ORAL_TABLET | ORAL | 5 refills | Status: DC

## 2021-08-15 MED ORDER — ZOSTER VAC RECOMB ADJUVANTED 50 MCG/0.5ML IM SUSR: 0.5000 mL | Freq: Once | INTRAMUSCULAR | 1 refills | Status: AC
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
## 2021-08-15 NOTE — Telephone Encounter (Signed)
Patient returned call and left a voice mail requesting a call back from the [facility] about this. ?

## 2021-08-15 NOTE — Patient Instructions (Signed)
Great to see you today.  ?Call Dr. Carles Collet to set up botox injections.  ? ?I also called in refills on your muscle relaxer to help with discomfort.  ? ? ?

## 2021-08-16 DIAGNOSIS — G243 Spasmodic torticollis: Secondary | ICD-10-CM | POA: Insufficient documentation

## 2021-08-16 DIAGNOSIS — M542 Cervicalgia: Secondary | ICD-10-CM | POA: Insufficient documentation

## 2021-08-16 NOTE — Telephone Encounter (Signed)
Called patient and she is interested in talking with Dr. Carles Collet about doing Botox for Cervical Dystonia. Patient scheduled for Thursday 5-18 ?

## 2021-08-23 NOTE — Progress Notes (Signed)
Assessment/Plan:   1.  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  Talked about nature and pathophysiology of both of these things.  Talked about the fact that dystonic hand tremor really does not respond very well to treatment, and ultimately we decided to hold off on any medications for this.             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  she would like to do botox for this b/c of difficulty moving head driving.  we will start with 200 U of botox.  We talked about the value of botox.  I discussed with the patient that this will not help the pain in her neck due to the facet arthrosis seen on her MRI cervical spine.  Discussed that this was a completely separate issue.  Patient expressed understanding.  She still wants to proceed.  The patient was educated on the botulinum toxin the black blox warning and given a copy of the botox patient medication guide.  The patient understands that this warning states that there have been reported cases of the Botox extending beyond the injection site and creating adverse effects, similar to those of botulism. This included loss of strength, trouble walking, hoarseness, trouble saying words clearly, loss of bladder control, trouble breathing, trouble swallowing, diplopia, blurry vision and ptosis. Most of the distant spread of Botox was happening in patients, primarily children, who received medication for spasticity or for cervical dystonia.    2.  Diabetic neuropathy             -Clinically, this appears to be very mild, especially given the fact that she has had diabetes for 30 years.             -on gabapentin, 300 mg q hs             -Patient and I did discuss over-the-counter lidocaine cream.  We discussed compounding cream as well.             -Discussed that her balance really is likely not from the  diabetic neuropathy.  Appears more orthopedic in nature.  She does not disagree.  Was already seen orthopedics for her hip.   3.  Cervical degenerative changes with facet arthrosis  -As above, discussed clearly with patient that this is a very separate issue from cervical dystonia, although both can cause pain.  She has seen orthopedics for this, although that office apparently does not do injections at the C1-C2 level.  She would have to go to neurosurgery for that.   Subjective:   Priscilla [name] was seen today in follow up for dystonia.  The patient has chronic neck pain.  She went to orthopedics.  She saw the nurse practitioner.  She had an MRI of the cervical spine.  There is no central canal stenosis.  She had facet arthrosis on the left at C1-C2 and degenerative neuroforaminal stenosis at several levels.  The initial notes from the nurse practitioner stated that they would consider facet injections, but when she followed back up, they stated that they do not perform the C1-C2 injections in their office and were unsure if other providers in the area did this (there are) and instead she recommended that she come back here for Botox.  Patient then called and asked to  proceed with Botox injections.  We asked for a follow-up appointment, as there is a very big difference between dystonia and facet arthrosis and goals with injections.  She does state it is also difficult for her to tell the difference in the pain but the head motion is limited when driving.     CURRENT MEDICATIONS:  Outpatient Encounter Medications as of 08/25/2021  Medication Sig   aspirin 81 MG tablet Take 81 mg by mouth daily.   atorvastatin (LIPITOR) 40 MG tablet Take 1 tablet (40 mg total) by mouth daily.   calcium-vitamin D 250-100 MG-UNIT per tablet Take 1 tablet by mouth daily.    COLLAGEN PO Take by mouth. Adds to tea daily   Continuous Blood Gluc Receiver (FREESTYLE LIBRE 2 READER) DEVI Use as instructed to check blood  sugar 4X daily   Continuous Blood Gluc Sensor (FREESTYLE LIBRE 2 SENSOR) MISC Use sensors with CGM to check sugars.   Cyanocobalamin (VITAMIN B12 PO) Place under the tongue.   DULoxetine (CYMBALTA) 60 MG capsule Take 1 capsule (60 mg total) by mouth daily.   gabapentin (NEURONTIN) 300 MG capsule Take 1 capsule (300 mg total) by mouth at bedtime.   glucose blood (ONETOUCH VERIO) test strip USE TO TEST BLOOD SUGAR TWICE A DAY DX: E11.319   insulin detemir (LEVEMIR) 100 UNIT/ML injection Inject 0.1-0.13 mLs (10-13 Units total) into the skin at bedtime. (Patient taking differently: Inject 13-15 Units into the skin at bedtime.)   Insulin Pen Needle 32G X 4 MM MISC Use 3x a day for insulin   Insulin Syringe-Needle U-100 (BD INSULIN SYRINGE U/F) 31G X 5/16" 0.5 ML MISC Use 1x a day for insulin   metFORMIN (GLUCOPHAGE) 1000 MG tablet TAKE 1 TABLET BY MOUTH TWICE DAILY WITH MEALS   metoprolol tartrate (LOPRESSOR) 25 MG tablet TAKE 1 TABLET(25 MG) BY MOUTH TWICE DAILY   NOVOLOG FLEXPEN 100 UNIT/ML FlexPen Inject 2-4 Units into the skin 3 (three) times daily with meals. (Patient taking differently: Inject 2-6 Units into the skin 3 (three) times daily with meals.)   sertraline (ZOLOFT) 100 MG tablet Take 1 tablet (100 mg total) by mouth daily.   tiZANidine (ZANAFLEX) 4 MG tablet TAKE 1 TABLET(4 MG) BY MOUTH EVERY 8 HOURS AS NEEDED FOR MUSCLE SPASMS   zolpidem (AMBIEN) 5 MG tablet TAKE 1 TABLET(5 MG) BY MOUTH AT BEDTIME AS NEEDED FOR SLEEP   pravastatin (PRAVACHOL) 40 MG tablet Take 1 tablet (40 mg total) by mouth daily. (Patient not taking: Reported on 08/25/2021)   No facility-administered encounter medications on file as of 08/25/2021.     Objective:   PHYSICAL EXAMINATION:    VITALS:   Vitals:   08/25/21 0918  BP: 125/82  Pulse: 75  SpO2: 100%  Weight: 157 lb (71.2 kg)    Gen:  Appears stated age and in NAD. HEENT:  Normocephalic, atraumatic. The mucous membranes are moist. The superficial  temporal arteries are without ropiness or tenderness. Cardiovascular: Regular rate and rhythm. Lungs: Clear to auscultation bilaterally. Neck: There are no carotid bruits noted bilaterally.  Head is slightly turned to the right with irregular head jerk/tremor.  There is limited ROM of the neck L and R   NEUROLOGICAL:   Orientation:  The patient is alert and oriented x 3.   Cranial nerves: There is good facial symmetry. Extraocular muscles are intact and visual fields are full to confrontational testing. Speech is fluent and clear.  Sensation: Sensation is intact to light touch touch  throughout  Coordination:  The patient has no dysdiadichokinesia or dysmetria. Motor: Strength is at least antigravity x 4 Gait and Station: The patient has antagalgic gait (relates to R hip pain).  Some astasia abasia quality.       Total time spent on today's visit was 22 minutes, including both face-to-face time and nonface-to-face time.  Time included that spent on review of records (prior notes available to me/labs/imaging if pertinent), discussing treatment and goals, answering patient's questions and coordinating care.  Cc:  Kuneff, Renee A, DO

## 2021-08-25 ENCOUNTER — Encounter: Payer: Self-pay | Admitting: Neurology

## 2021-08-25 ENCOUNTER — Ambulatory Visit (INDEPENDENT_AMBULATORY_CARE_PROVIDER_SITE_OTHER): Payer: Medicare Other | Admitting: Neurology

## 2021-08-25 VITALS — BP 125/82 | HR 75 | Wt 157.0 lb

## 2021-08-25 DIAGNOSIS — G243 Spasmodic torticollis: Secondary | ICD-10-CM | POA: Diagnosis not present

## 2021-08-25 NOTE — Patient Instructions (Signed)
We will get authorization for your botox.  Call us if you haven'g heard from [place] in the next 2-3 weeks.  The physicians and staff at [facility] are committed to providing excellent care. You may receive a survey requesting feedback about your experience at our office. We strive to receive "very good" responses to the survey questions. If you feel that your experience would prevent you from giving the office a "very good " response, please contact our office to try to remedy the situation. We may be reached at [phone]. Thank you for taking the time out of your busy day to complete the survey.

## 2021-08-29 ENCOUNTER — Other Ambulatory Visit (HOSPITAL_COMMUNITY): Payer: Self-pay

## 2021-08-29 ENCOUNTER — Encounter: Payer: Self-pay | Admitting: Hematology & Oncology

## 2021-08-29 ENCOUNTER — Telehealth (HOSPITAL_COMMUNITY): Payer: Self-pay | Admitting: Pharmacy Technician

## 2021-08-29 NOTE — Telephone Encounter (Signed)
Patient Advocate Encounter  Prior Authorization for Botox 200 units has been approved.    PA# [id number] Effective dates: 08/29/2021 through further notice  Patients co-pay is $967.14 due to a $505.00 deductible .     Lyndel Safe, [place] Patient Advocate Specialist [place] Patient Advocate Team Direct Number: [phone]  Fax: [phone]

## 2021-08-29 NOTE — Telephone Encounter (Signed)
Patient added to [employer] Portal- Medical benefit and Buy/Bill will likely be best option for this patient.  Benefit Verification [id number] Submitted!

## 2021-08-29 NOTE — Telephone Encounter (Signed)
Patient Advocate Encounter   Received notification that prior authorization for Botox 200UNIT solution is required.   PA submitted on 08/29/2021 Key [id number] Status is pending       Lyndel Safe, [place] Patient Advocate Specialist [place] Patient Advocate Team Direct Number: [phone]  Fax: [phone]

## 2021-08-29 NOTE — Telephone Encounter (Signed)
-----   Message from Nira Conn, [place] sent at 08/25/2021 10:05 AM EDT ----- Please get botox authorization for 200 U, dx:  cervical dystonia  ----- Message ----- From: Ludwig Clarks, DO Sent: 08/25/2021   9:44 AM EDT To: Nira Conn, CMA  Please get botox authorization for 200 U, dx:  cervical dystonia

## 2021-08-30 NOTE — Telephone Encounter (Addendum)
Received Benefits from [employer]- SR #:  [id number]. Buy/Bill will be more affordable for patient.

## 2021-09-09 ENCOUNTER — Other Ambulatory Visit: Payer: Self-pay | Admitting: Family

## 2021-09-09 ENCOUNTER — Other Ambulatory Visit: Payer: Self-pay | Admitting: Internal Medicine

## 2021-09-09 DIAGNOSIS — E559 Vitamin D deficiency, unspecified: Secondary | ICD-10-CM

## 2021-09-12 ENCOUNTER — Other Ambulatory Visit: Payer: Self-pay

## 2021-09-12 ENCOUNTER — Other Ambulatory Visit: Payer: Self-pay | Admitting: *Deleted

## 2021-09-12 MED ORDER — ZOLPIDEM TARTRATE 5 MG PO TABS
ORAL_TABLET | ORAL | 0 refills | Status: DC
Start: 2021-09-12 — End: 2021-10-13

## 2021-09-12 MED ORDER — DULOXETINE HCL 60 MG PO CPEP
60.0000 mg | ORAL_CAPSULE | Freq: Every day | ORAL | 0 refills | Status: DC
Start: 2021-09-12 — End: 2021-10-20

## 2021-09-12 NOTE — Telephone Encounter (Signed)
Next Visit: 10/31/2021  Last Visit: 05/02/2021  Last Fill: 08/08/2021  Dx: Other insomnia   Current Dose per office note on 05/02/2021: Ambien 5 mg 1 tablet by mouth at bedtime for insomnia.  Okay to refill Ambien?

## 2021-09-13 ENCOUNTER — Telehealth: Payer: Self-pay

## 2021-09-13 NOTE — Telephone Encounter (Signed)
Sent request to PA team for this patient to receive 200 units of Botox

## 2021-09-15 DIAGNOSIS — D1801 Hemangioma of skin and subcutaneous tissue: Secondary | ICD-10-CM | POA: Diagnosis not present

## 2021-09-15 DIAGNOSIS — D2262 Melanocytic nevi of left upper limb, including shoulder: Secondary | ICD-10-CM | POA: Diagnosis not present

## 2021-09-15 DIAGNOSIS — D225 Melanocytic nevi of trunk: Secondary | ICD-10-CM | POA: Diagnosis not present

## 2021-09-15 DIAGNOSIS — L821 Other seborrheic keratosis: Secondary | ICD-10-CM | POA: Diagnosis not present

## 2021-09-23 ENCOUNTER — Ambulatory Visit (INDEPENDENT_AMBULATORY_CARE_PROVIDER_SITE_OTHER): Payer: Medicare Other | Admitting: Neurology

## 2021-09-23 DIAGNOSIS — G243 Spasmodic torticollis: Secondary | ICD-10-CM | POA: Diagnosis not present

## 2021-09-23 MED ORDER — ONABOTULINUMTOXINA 100 UNITS IJ SOLR
200.0000 [IU] | Freq: Once | INTRAMUSCULAR | Status: AC
  Administered 2021-09-23: 200 [IU] via INTRAMUSCULAR

## 2021-09-23 NOTE — Procedures (Signed)
[facility]   Procedure Note Botox  Attending: Dr. Wells Guiles Graham Doukas  Preoperative Diagnosis(es): Cervical Dystonia  Result History  N/a  Consent obtained from: The patient Benefits discussed included, but were not limited to decreased muscle tightness, increased joint range of motion, and decreased pain.  Risk discussed included, but were not limited pain and discomfort, bleeding, bruising, excessive weakness, venous thrombosis, muscle atrophy and dysphagia.  A copy of the patient medication guide was given to the patient which explains the blackbox warning.  Patients identity and treatment sites confirmed Yes.  .  Details of Procedure: Skin was cleaned with alcohol.  A 30 gauge, 19m  needle was introduced to the target muscle, except for posterior splenius where 27 gauge, 1.5 inch needle used.   Prior to injection, the needle plunger was aspirated to make sure the needle was not within a blood vessel.  There was no blood retrieved on aspiration.    Following is a summary of the muscles injected  And the amount of Botulinum toxin used:   Dilution 0.9% preservative free saline mixed with 100 u Botox type A to make 10 U per 0.1cc  Injections  Location Left  Right Units Number of sites        Sternocleidomastoid 60  60 1  Splenius Capitus, posterior approach  70 70 1  Splenius Capitus, lateral approach  '30 30 1  '$ Levator Scapulae      Trapezius 10/10 10/10 40 2 each side        TOTAL UNITS:   200    Agent: Botulinum Type A ( Onobotulinum Toxin type A ).  2 vials of Botox were used, each containing 100 units and freshly diluted with 1 mL of sterile, non-preserved saline   Total injected (Units): 200  Total wasted (Units): none wasted   Pt tolerated procedure well without complications.   Reinjection is anticipated in 3 months.  Clinical comment: Patient mentions during the procedure that she thought that this was associated with her "eustachian tube dysfunction", eye  watering, congestion and discussed with her that these were not features of cervical dystonia and she would need to follow-up elsewhere regarding those symptoms.

## 2021-09-25 ENCOUNTER — Other Ambulatory Visit: Payer: Self-pay | Admitting: Orthopaedic Surgery

## 2021-09-26 ENCOUNTER — Other Ambulatory Visit: Payer: Self-pay | Admitting: Physical Medicine and Rehabilitation

## 2021-09-27 ENCOUNTER — Encounter: Payer: Self-pay | Admitting: Podiatry

## 2021-09-27 ENCOUNTER — Ambulatory Visit (INDEPENDENT_AMBULATORY_CARE_PROVIDER_SITE_OTHER): Payer: Medicare Other | Admitting: Podiatry

## 2021-09-27 DIAGNOSIS — B351 Tinea unguium: Secondary | ICD-10-CM

## 2021-09-27 DIAGNOSIS — M79676 Pain in unspecified toe(s): Secondary | ICD-10-CM | POA: Diagnosis not present

## 2021-09-27 DIAGNOSIS — B353 Tinea pedis: Secondary | ICD-10-CM

## 2021-09-27 DIAGNOSIS — E1142 Type 2 diabetes mellitus with diabetic polyneuropathy: Secondary | ICD-10-CM

## 2021-09-27 MED ORDER — KETOCONAZOLE 2 % EX CREA: TOPICAL_CREAM | CUTANEOUS | 3 refills | Status: DC

## 2021-09-27 NOTE — Progress Notes (Signed)
She presents today for follow-up of her diabetic foot.  She has a history of neuropathy.  She states that she just noticed recently a rash on the right foot.  Objective: Vital signs are stable alert oriented x3.  Pulses are palpable.  Neurologic sensorium is diminished per Semmes Weinstein monofilament.  Muscle strength is normal and symmetrical bilateral.  Deep tendon reflexes are intact.  Right foot demonstrates nail dystrophy with onychomycosis and what appears to be patches of tinea pedis medial longitudinal arch dorsal medial foot near the webspaces.  No open lesions or wounds are noted.  Assessment: Diabetic peripheral neuropathy tinea pedis and onychomycosis.  Plan: Started her on ketoconazole and I will follow-up with her in 6 months

## 2021-10-03 ENCOUNTER — Encounter: Payer: Self-pay | Admitting: Family Medicine

## 2021-10-04 ENCOUNTER — Ambulatory Visit (INDEPENDENT_AMBULATORY_CARE_PROVIDER_SITE_OTHER): Payer: Medicare Other | Admitting: Family Medicine

## 2021-10-04 ENCOUNTER — Encounter: Payer: Self-pay | Admitting: Family Medicine

## 2021-10-04 VITALS — BP 84/54 | HR 63 | Temp 98.0°F | Ht 64.0 in | Wt 155.0 lb

## 2021-10-04 DIAGNOSIS — H9209 Otalgia, unspecified ear: Secondary | ICD-10-CM | POA: Diagnosis not present

## 2021-10-04 DIAGNOSIS — M47816 Spondylosis without myelopathy or radiculopathy, lumbar region: Secondary | ICD-10-CM

## 2021-10-04 DIAGNOSIS — G243 Spasmodic torticollis: Secondary | ICD-10-CM

## 2021-10-04 DIAGNOSIS — R590 Localized enlarged lymph nodes: Secondary | ICD-10-CM | POA: Diagnosis not present

## 2021-10-04 DIAGNOSIS — M5030 Other cervical disc degeneration, unspecified cervical region: Secondary | ICD-10-CM

## 2021-10-04 DIAGNOSIS — M542 Cervicalgia: Secondary | ICD-10-CM

## 2021-10-04 MED ORDER — PREDNISONE 20 MG PO TABS: ORAL_TABLET | ORAL | 0 refills | Status: DC

## 2021-10-04 MED ORDER — FLUTICASONE PROPIONATE 50 MCG/ACT NA SUSP: 2.0000 | Freq: Two times a day (BID) | NASAL | 6 refills | Status: DC

## 2021-10-04 MED ORDER — PREGABALIN 75 MG PO CAPS: 75.0000 mg | ORAL_CAPSULE | Freq: Two times a day (BID) | ORAL | 5 refills | Status: DC
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
## 2021-10-05 MED ORDER — METHYLPREDNISOLONE ACETATE 80 MG/ML IJ SUSP
80.0000 mg | Freq: Once | INTRAMUSCULAR | Status: AC
  Administered 2021-10-04: 80 mg via INTRAMUSCULAR

## 2021-10-06 ENCOUNTER — Other Ambulatory Visit: Payer: Self-pay | Admitting: Internal Medicine

## 2021-10-06 ENCOUNTER — Telehealth: Payer: Self-pay | Admitting: Family Medicine

## 2021-10-06 ENCOUNTER — Ambulatory Visit
Admission: RE | Admit: 2021-10-06 | Discharge: 2021-10-06 | Disposition: A | Discharge status: Home / Self Care | Payer: Medicare Other | Source: Ambulatory Visit | Attending: Family Medicine | Admitting: Family Medicine

## 2021-10-06 DIAGNOSIS — R590 Localized enlarged lymph nodes: Secondary | ICD-10-CM

## 2021-10-06 DIAGNOSIS — M542 Cervicalgia: Secondary | ICD-10-CM | POA: Diagnosis not present

## 2021-10-06 NOTE — Telephone Encounter (Signed)
error 

## 2021-10-13 ENCOUNTER — Other Ambulatory Visit: Payer: Self-pay | Admitting: Physician Assistant

## 2021-10-13 NOTE — Telephone Encounter (Signed)
Next Visit: 10/31/2021   Last Visit: 05/02/2021   Last Fill: 09/12/2021   Dx: Other insomnia    Current Dose per office note on 05/02/2021: Ambien 5 mg 1 tablet by mouth at bedtime for insomnia.   Okay to refill Ambien?

## 2021-10-17 ENCOUNTER — Telehealth: Payer: Self-pay

## 2021-10-17 NOTE — Progress Notes (Unsigned)
Benito Mccreedy D.[place] Phone: [phone]   Assessment and Plan:     There are no diagnoses linked to this encounter.  ***   Pertinent previous records reviewed include ***   Follow Up: ***     Subjective:   I, Priscilla [name], am serving as a Education administrator for Doctor Glennon Mac  Chief Complaint: neck pain   HPI:   10/18/2021 Patient is a 71 year old female complaining of neck pain . Patient states  Relevant Historical Information: ***  Additional pertinent review of systems negative.   Current Outpatient Medications:    aspirin 81 MG tablet, Take 81 mg by mouth daily., Disp: , Rfl:    atorvastatin (LIPITOR) 40 MG tablet, Take 1 tablet (40 mg total) by mouth daily., Disp: 90 tablet, Rfl: 3   calcium-vitamin D 250-100 MG-UNIT per tablet, Take 1 tablet by mouth daily. , Disp: , Rfl:    COLLAGEN PO, Take by mouth. Adds to tea daily, Disp: , Rfl:    Continuous Blood Gluc Receiver (FREESTYLE LIBRE 2 READER) DEVI, Use as instructed to check blood sugar 4X daily, Disp: 1 each, Rfl: 0   Continuous Blood Gluc Sensor (FREESTYLE LIBRE 2 SENSOR) MISC, Use sensors with CGM to check sugars., Disp: 6 each, Rfl: 3   Cyanocobalamin (VITAMIN B12 PO), Place under the tongue., Disp: , Rfl:    DULoxetine (CYMBALTA) 60 MG capsule, Take 1 capsule (60 mg total) by mouth daily., Disp: 30 capsule, Rfl: 0   fluticasone (FLONASE) 50 MCG/ACT nasal spray, Place 2 sprays into both nostrils in the morning and at bedtime., Disp: 16 g, Rfl: 6   glucose blood (ONETOUCH VERIO) test strip, USE TO TEST BLOOD SUGAR TWICE A DAY DX: E11.319, Disp: 150 each, Rfl: 3   insulin detemir (LEVEMIR) 100 UNIT/ML injection, Inject 0.13-0.15 mLs (13-15 Units total) into the skin at bedtime., Disp: 30 mL, Rfl: 3   Insulin Pen Needle 32G X 4 MM MISC, Use 3x a day for insulin, Disp: 300 each, Rfl: 3   Insulin Syringe-Needle U-100 (BD INSULIN SYRINGE  U/F) 31G X 5/16" 0.5 ML MISC, Use 1x a day for insulin, Disp: 100 each, Rfl: 3   ketoconazole (NIZORAL) 2 % cream, Apply to affected area twice daily., Disp: 60 g, Rfl: 3   meloxicam (MOBIC) 15 MG tablet, Take 15 mg by mouth daily., Disp: , Rfl:    metFORMIN (GLUCOPHAGE) 1000 MG tablet, TAKE 1 TABLET BY MOUTH TWICE DAILY WITH MEALS, Disp: 180 tablet, Rfl: 3   metoprolol tartrate (LOPRESSOR) 25 MG tablet, TAKE 1 TABLET(25 MG) BY MOUTH TWICE DAILY, Disp: 60 tablet, Rfl: 5   NOVOLOG FLEXPEN 100 UNIT/ML FlexPen, Inject 2-4 Units into the skin 3 (three) times daily with meals. (Patient taking differently: Inject 2-6 Units into the skin 3 (three) times daily with meals.), Disp: 15 mL, Rfl: 2   pravastatin (PRAVACHOL) 40 MG tablet, Take 1 tablet (40 mg total) by mouth daily., Disp: 30 tablet, Rfl: 11   predniSONE (DELTASONE) 20 MG tablet, 60 mg x3d, 40 mg x3d, 20 mg x2d, 10 mg x2d, Disp: 18 tablet, Rfl: 0   pregabalin (LYRICA) 75 MG capsule, Take 1 capsule (75 mg total) by mouth 2 (two) times daily., Disp: 60 capsule, Rfl: 5   sertraline (ZOLOFT) 100 MG tablet, Take 1 tablet (100 mg total) by mouth daily., Disp: 30 tablet, Rfl: 5   tiZANidine (ZANAFLEX) 4 MG tablet, TAKE  1 TABLET(4 MG) BY MOUTH EVERY 8 HOURS AS NEEDED FOR MUSCLE SPASMS, Disp: 90 tablet, Rfl: 5   Vitamin D, Ergocalciferol, (DRISDOL) 1.25 MG (50000 UNIT) CAPS capsule, TAKE 1 CAPSULE BY MOUTH EVERY 7 DAYS, Disp: 4 capsule, Rfl: 6   zolpidem (AMBIEN) 5 MG tablet, TAKE 1 TABLET BY MOUTH AT BEDTIME FOR INSOMNIA, Disp: 30 tablet, Rfl: 0   Objective:     There were no vitals filed for this visit.    There is no height or weight on file to calculate BMI.    Physical Exam:    ***   Electronically signed by:  Benito Mccreedy D.[facility] 11:32 AM 10/17/21

## 2021-10-17 NOTE — Telephone Encounter (Signed)
Inbound fax requesting recent clinical notes. Clinical notes routed via Epic to [phone].

## 2021-10-18 ENCOUNTER — Ambulatory Visit (INDEPENDENT_AMBULATORY_CARE_PROVIDER_SITE_OTHER): Payer: Medicare Other | Admitting: Sports Medicine

## 2021-10-18 VITALS — BP 124/82 | HR 69 | Ht 64.0 in | Wt 155.0 lb

## 2021-10-18 DIAGNOSIS — M5030 Other cervical disc degeneration, unspecified cervical region: Secondary | ICD-10-CM | POA: Diagnosis not present

## 2021-10-18 DIAGNOSIS — M542 Cervicalgia: Secondary | ICD-10-CM

## 2021-10-18 MED ORDER — MELOXICAM 15 MG PO TABS: 15.0000 mg | ORAL_TABLET | Freq: Every day | ORAL | 0 refills | Status: DC

## 2021-10-18 NOTE — Patient Instructions (Addendum)
Good to see you  - Start meloxicam 15 mg daily x2 weeks.  If still having pain after 2 weeks, complete 3rd-week of meloxicam. May use remaining meloxicam as needed once daily for pain control.  Do not to use additional NSAIDs while taking meloxicam.  May use Tylenol [phone] mg 2 to 3 times a day for breakthrough pain. Neck HEP 4-5 week follow up

## 2021-10-21 ENCOUNTER — Encounter: Payer: Self-pay | Admitting: Family Medicine

## 2021-10-21 ENCOUNTER — Ambulatory Visit (INDEPENDENT_AMBULATORY_CARE_PROVIDER_SITE_OTHER): Payer: Medicare Other | Admitting: Family Medicine

## 2021-10-21 VITALS — BP 103/63 | HR 64 | Temp 98.1°F | Ht 64.0 in | Wt 156.0 lb

## 2021-10-21 DIAGNOSIS — F32A Depression, unspecified: Secondary | ICD-10-CM

## 2021-10-21 DIAGNOSIS — K909 Intestinal malabsorption, unspecified: Secondary | ICD-10-CM

## 2021-10-21 DIAGNOSIS — I10 Essential (primary) hypertension: Secondary | ICD-10-CM | POA: Diagnosis not present

## 2021-10-21 DIAGNOSIS — M47816 Spondylosis without myelopathy or radiculopathy, lumbar region: Secondary | ICD-10-CM

## 2021-10-21 DIAGNOSIS — E1142 Type 2 diabetes mellitus with diabetic polyneuropathy: Secondary | ICD-10-CM

## 2021-10-21 DIAGNOSIS — Z9884 Bariatric surgery status: Secondary | ICD-10-CM

## 2021-10-21 DIAGNOSIS — D500 Iron deficiency anemia secondary to blood loss (chronic): Secondary | ICD-10-CM

## 2021-10-21 DIAGNOSIS — E559 Vitamin D deficiency, unspecified: Secondary | ICD-10-CM

## 2021-10-21 DIAGNOSIS — E663 Overweight: Secondary | ICD-10-CM

## 2021-10-21 DIAGNOSIS — F419 Anxiety disorder, unspecified: Secondary | ICD-10-CM

## 2021-10-21 DIAGNOSIS — E782 Mixed hyperlipidemia: Secondary | ICD-10-CM | POA: Diagnosis not present

## 2021-10-21 MED ORDER — DULOXETINE HCL 60 MG PO CPEP: 60.0000 mg | ORAL_CAPSULE | Freq: Every day | ORAL | 5 refills | Status: DC

## 2021-10-21 MED ORDER — METOPROLOL TARTRATE 25 MG PO TABS: ORAL_TABLET | ORAL | 5 refills | Status: DC

## 2021-10-21 MED ORDER — SERTRALINE HCL 100 MG PO TABS: 100.0000 mg | ORAL_TABLET | Freq: Every day | ORAL | 5 refills | Status: DC

## 2021-10-21 NOTE — Progress Notes (Signed)
Patient Care Team    Relationship Specialty Notifications Start End  Ma Hillock, DO PCP - General Family Medicine  08/29/21   [name], MD Consulting Physician Orthopedic Surgery  08/06/15   Philemon Kingdom, MD Consulting Physician Internal Medicine  08/06/15    Comment: endocrine  Mcarthur Rossetti, MD Consulting Physician Orthopedic Surgery  08/06/15   Bo Merino, MD Consulting Physician Rheumatology  08/06/15   Volanda Napoleon, MD Consulting Physician Oncology  11/10/16   Magnus Sinning, MD Consulting Physician Physical Medicine and Rehabilitation  11/10/16   Dermatology, [facility]    11/10/16    Comment: Dr. [place]  Hyatt, Romilda Garret, [place] Consulting Physician Podiatry  12/03/18   Tat, Eustace Quail, DO Consulting Physician Neurology  08/25/21      SUBJECTIVE Chief Complaint  Patient presents with   Hypertension    [place]; pt is not fasting     HPI: Priscilla [name] is a 71 y.o. female present for cmc Hypertension/hyperlipidemia: Pt reports compliance with metoprolol 25 mg BID. Patient denies chest pain, shortness of breath, dizziness or lower extremity edema. .  She reports compliance with baby aspirin and pravastatin. Diet: low sodium.  Exercise: routinely.  RF: HTN, HLD, DM, FHx HD   Peripheral neuropathy/depression/anxiety: Patient reports compliance with  Cymbalta 60 mg a day and zoloft 100 mg qd. She reports the Cymbalta has been  helpful and the lyrica started last visit has helped a great deal with her neuropathy.     Review of Systems  All other systems reviewed and are negative.       10/21/2021    9:31 AM 08/15/2021   10:08 AM 02/23/2021    8:18 AM 10/15/2020    2:38 PM 01/14/2020    9:53 AM  Depression screen PHQ 2/9  Decreased Interest 1 1 0 0 0  Down, Depressed, Hopeless '1 1 1 2 3  '$ PHQ - 2 Score '2 2 1 2 3  '$ Altered sleeping 0   2 0  Tired, decreased energy '3   2 1  '$ Change in appetite 0   0 0  Feeling bad or failure about yourself  0   0 0   Trouble concentrating 0   1 1  Moving slowly or fidgety/restless 0   1 0  Suicidal thoughts 0   0 0  PHQ-9 Score '5   8 5  '$ Difficult doing work/chores     Somewhat difficult      10/21/2021    9:31 AM 10/15/2020    2:38 PM 09/23/2019    3:35 PM 03/14/2019    9:55 AM  GAD 7 : Generalized Anxiety Score  Nervous, Anxious, on Edge '3 2 3 '$ 0  Control/stop worrying '2 2 3 '$ 0  Worry too much - different things '3 2 3 '$ 0  Trouble relaxing 2 0 0 0  Restless 0 0 0 0  Easily annoyed or irritable 1 1 0 0  Afraid - awful might happen 1 0 0 0  Total GAD 7 Score '12 7 9 '$ 0  Anxiety Difficulty   Not difficult at all Not difficult at all    Patient Active Problem List   Diagnosis Date Noted   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   Neck pain 08/16/2021   Decreased circulation right great toe 05/28/2020   Hip pain 11/27/2019   Overweight (BMI 25.0-29.9) 02/11/2018   Vitamin D deficiency 10/13/2016   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   Gastric bypass status for obesity 08/31/2016  Iron deficiency anemia 04/28/2016   Other insomnia 03/14/2016   DDD (degenerative disc disease), cervical 03/14/2016   Spondylosis without myelopathy or radiculopathy, lumbar region 01/31/2016   Anxiety and depression 06/25/2014   Psoriatic arthritis - sees rheumatologist 02/20/2014   Asthma 12/14/2008   Diabetic peripheral neuropathy associated with type 2 diabetes mellitus ([place]) 08/06/2008   Hyperlipemia 11/12/2006   Essential hypertension 11/12/2006    Social History   Tobacco Use   Smoking status: Never   Smokeless tobacco: Never  Substance Use Topics   Alcohol use: Yes    Comment: glass of wine-special occasion    Current Outpatient Medications:    aspirin 81 MG tablet, Take 81 mg by mouth daily., Disp: , Rfl:    calcium-vitamin D 250-100 MG-UNIT per tablet, Take 1 tablet by mouth daily. , Disp: , Rfl:    COLLAGEN PO, Take by mouth. Adds to tea daily, Disp: , Rfl:    Continuous Blood Gluc Receiver (FREESTYLE LIBRE 2  READER) DEVI, Use as instructed to check blood sugar 4X daily, Disp: 1 each, Rfl: 0   Continuous Blood Gluc Sensor (FREESTYLE LIBRE 2 SENSOR) MISC, Use sensors with CGM to check sugars., Disp: 6 each, Rfl: 3   Cyanocobalamin (VITAMIN B12 PO), Place under the tongue., Disp: , Rfl:    fluticasone (FLONASE) 50 MCG/ACT nasal spray, Place 2 sprays into both nostrils in the morning and at bedtime., Disp: 16 g, Rfl: 6   glucose blood (ONETOUCH VERIO) test strip, USE TO TEST BLOOD SUGAR TWICE A DAY DX: E11.319, Disp: 150 each, Rfl: 3   insulin detemir (LEVEMIR) 100 UNIT/ML injection, Inject 0.13-0.15 mLs (13-15 Units total) into the skin at bedtime., Disp: 30 mL, Rfl: 3   Insulin Pen Needle 32G X 4 MM MISC, Use 3x a day for insulin, Disp: 300 each, Rfl: 3   Insulin Syringe-Needle U-100 (BD INSULIN SYRINGE U/F) 31G X 5/16" 0.5 ML MISC, Use 1x a day for insulin, Disp: 100 each, Rfl: 3   ketoconazole (NIZORAL) 2 % cream, Apply to affected area twice daily., Disp: 60 g, Rfl: 3   meloxicam (MOBIC) 15 MG tablet, Take 15 mg by mouth daily., Disp: , Rfl:    meloxicam (MOBIC) 15 MG tablet, Take 1 tablet (15 mg total) by mouth daily., Disp: 30 tablet, Rfl: 0   metFORMIN (GLUCOPHAGE) 1000 MG tablet, TAKE 1 TABLET BY MOUTH TWICE DAILY WITH MEALS, Disp: 180 tablet, Rfl: 3   NOVOLOG FLEXPEN 100 UNIT/ML FlexPen, Inject 2-4 Units into the skin 3 (three) times daily with meals. (Patient taking differently: Inject 2-6 Units into the skin 3 (three) times daily with meals.), Disp: 15 mL, Rfl: 2   pregabalin (LYRICA) 75 MG capsule, Take 1 capsule (75 mg total) by mouth 2 (two) times daily., Disp: 60 capsule, Rfl: 5   tiZANidine (ZANAFLEX) 4 MG tablet, TAKE 1 TABLET(4 MG) BY MOUTH EVERY 8 HOURS AS NEEDED FOR MUSCLE SPASMS, Disp: 90 tablet, Rfl: 5   Vitamin D, Ergocalciferol, (DRISDOL) 1.25 MG (50000 UNIT) CAPS capsule, TAKE 1 CAPSULE BY MOUTH EVERY 7 DAYS, Disp: 4 capsule, Rfl: 6   zolpidem (AMBIEN) 5 MG tablet, TAKE 1 TABLET BY  MOUTH AT BEDTIME FOR INSOMNIA, Disp: 30 tablet, Rfl: 0   atorvastatin (LIPITOR) 40 MG tablet, Take 1 tablet (40 mg total) by mouth daily., Disp: 90 tablet, Rfl: 3   DULoxetine (CYMBALTA) 60 MG capsule, Take 1 capsule (60 mg total) by mouth daily., Disp: 30 capsule, Rfl: 5   metoprolol  tartrate (LOPRESSOR) 25 MG tablet, TAKE 1 TABLET(25 MG) BY MOUTH TWICE DAILY, Disp: 60 tablet, Rfl: 5   pravastatin (PRAVACHOL) 40 MG tablet, Take 1 tablet (40 mg total) by mouth daily., Disp: 30 tablet, Rfl: 11   sertraline (ZOLOFT) 100 MG tablet, Take 1 tablet (100 mg total) by mouth daily., Disp: 30 tablet, Rfl: 5  No Known Allergies  OBJECTIVE: BP 103/63   Pulse 64   Temp 98.1 F (36.7 C) (Oral)   Ht '5\' 4"'$  (1.626 m)   Wt 156 lb (70.8 kg)   LMP  (LMP Unknown)   SpO2 96%   BMI 26.78 kg/m  Physical Exam Vitals and nursing note reviewed.  Constitutional:      General: She is not in acute distress.    Appearance: Normal appearance. She is not ill-appearing, toxic-appearing or diaphoretic.  HENT:     Head: Normocephalic and atraumatic.  Eyes:     General: No scleral icterus.       Right eye: No discharge.        Left eye: No discharge.     Extraocular Movements: Extraocular movements intact.     Conjunctiva/sclera: Conjunctivae normal.     Pupils: Pupils are equal, round, and reactive to light.  Cardiovascular:     Rate and Rhythm: Normal rate and regular rhythm.  Pulmonary:     Effort: Pulmonary effort is normal. No respiratory distress.     Breath sounds: Normal breath sounds. No wheezing, rhonchi or rales.  Musculoskeletal:     Cervical back: Neck supple. No tenderness.     Right lower leg: No edema.     Left lower leg: No edema.  Lymphadenopathy:     Cervical: No cervical adenopathy.  Skin:    General: Skin is warm and dry.     Coloration: Skin is not jaundiced or pale.     Findings: No erythema or rash.  Neurological:     Mental Status: She is alert and oriented to person, place, and  time. Mental status is at baseline.     Motor: No weakness.     Gait: Gait normal.  Psychiatric:        Mood and Affect: Mood normal.        Behavior: Behavior normal.        Thought Content: Thought content normal.        Judgment: Judgment normal.     ASSESSMENT AND PLAN: Priscilla [name] is a 71 y.o. female present for  Essential hypertension/palpitations/HLD/overweight -stable - Low blood pressures with reported dizziness last visit.  -Continue metoprolol 25 mg daily, if blood pressure still less than 062 systolic after discontinuing lisinopril then she is instructed to take a half a tab twice daily of her metoprolol. -continue liptior  Hydrate - low sodium. Routine exercise.  - con't baby ASA Labs utd- due next visit - f/u 5.5 months.   Vitamin D deficiency: Vitamin D levels today Continue supplementation   Anxiety and depression/neuropathy Stable Continue Cymbalta 60 mg daily Continue    Zoloft 100 mg.   - tolerating lyrica 75 mg BID and it is working well for her.  -Follow-up 5.5 months-sooner if depression anxiety is not adequately controlled.   type 2 diabetes mellitus with retinopathy, with long-term current use of insulin ([place]) Managed by endocrinology.  A1c is at goal. Urine microalb UTD   No orders of the defined types were placed in this encounter.   Meds ordered this encounter  Medications  DULoxetine (CYMBALTA) 60 MG capsule    Sig: Take 1 capsule (60 mg total) by mouth daily.    Dispense:  30 capsule    Refill:  5   metoprolol tartrate (LOPRESSOR) 25 MG tablet    Sig: TAKE 1 TABLET(25 MG) BY MOUTH TWICE DAILY    Dispense:  60 tablet    Refill:  5   sertraline (ZOLOFT) 100 MG tablet    Sig: Take 1 tablet (100 mg total) by mouth daily.    Dispense:  30 tablet    Refill:  5    Referral Orders  No referral(s) requested today      Howard Pouch, DO 10/21/2021

## 2021-10-21 NOTE — Patient Instructions (Signed)
Return in about 24 weeks (around 04/07/2022) for Routine chronic condition follow-up.        Great to see you today.  I have refilled the medication(s) we provide.   If labs were collected, we will inform you of lab results once received either by echart message or telephone call.   - echart message- for normal results that have been seen by the patient already.   - telephone call: abnormal results or if patient has not viewed results in their echart.

## 2021-10-26 NOTE — Progress Notes (Unsigned)
Office Visit Note  Patient: Priscilla [name]             Date of Birth: [birth year]           MRN: [id number]             PCP: Ma Hillock, DO Referring: Ma Hillock, DO Visit Date: 11/09/2021 Occupation: '@GUAROCC'$ @  Subjective:  Neck pain   History of Present Illness: Priscilla [name] is a 71 y.o. female with history of psoriatic arthritis and osteoarthritis. She is not currently taking any immunosuppressive agents.  She denies any signs or symptoms of a psoriatic arthritis flare.  She has occasional psoriasis on her scalp and uses over-the-counter shampoo which has been helpful.  She denies any other patches of psoriasis at this time.  She denies any joint swelling.  She denies any Achilles tendinitis or plantar fasciitis.  She denies any signs or symptoms of uveitis.   Patient reports that she has been experiencing chronic pain in her neck since October 2022.  She has been under the care of several providers including orthopedics and neurology.  She completed 8 weeks of physical therapy with no improvement in her symptoms.  She had MRI of the C-spine on 07/27/2021.  Going to the patient she is not a good candidate for injections by Dr. Ernestina Patches.  She has had Botox injections performed by Dr. Carles Collet for management of cervical dystonia.  Her pain has improved but she has had to work on Warden/ranger.  She has been performing isometric home exercises and has noticed some improvement in her range of motion.  Activities of Daily Living:  Patient reports morning stiffness for 1 hour  Patient Denies nocturnal pain.  Difficulty dressing/grooming: Denies Difficulty climbing stairs: Reports Difficulty getting out of chair: Reports Difficulty using hands for taps, buttons, cutlery, and/or writing: Denies  Review of Systems  Constitutional:  Positive for fatigue.  HENT:  Negative for mouth sores, mouth dryness and nose dryness.   Eyes:  Positive for dryness ([place]). Negative for pain  and visual disturbance.  Respiratory:  Negative for cough, hemoptysis, shortness of breath and difficulty breathing.   Cardiovascular:  Negative for chest pain, palpitations, hypertension and swelling in legs/feet.  Gastrointestinal:  Negative for blood in stool, constipation and diarrhea.  Endocrine: Negative for increased urination.  Genitourinary:  Negative for painful urination.  Musculoskeletal:  Positive for joint pain, joint pain and morning stiffness. Negative for joint swelling, myalgias, muscle weakness, muscle tenderness and myalgias.  Skin:  Positive for rash. Negative for color change, pallor, hair loss, nodules/bumps, skin tightness, ulcers and sensitivity to sunlight.  Allergic/Immunologic: Negative for susceptible to infections.  Neurological:  Positive for dizziness. Negative for numbness, headaches and weakness.  Hematological:  Negative for swollen glands.  Psychiatric/Behavioral:  Positive for depressed mood. Negative for sleep disturbance. The patient is nervous/anxious.     PMFS History:  Patient Active Problem List   Diagnosis Date Noted  . 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  . Neck pain 08/16/2021  . Decreased circulation right great toe 05/28/2020  . Hip pain 11/27/2019  . Overweight (BMI 25.0-29.9) 02/11/2018  . Vitamin D deficiency 10/13/2016  . 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 Iron deficiency anemia 04/28/2016  . Other insomnia 03/14/2016  . DDD (degenerative disc disease), cervical 03/14/2016  . Spondylosis without myelopathy or radiculopathy, lumbar region 01/31/2016  . Anxiety and depression 06/25/2014  . Psoriatic arthritis - sees rheumatologist  02/20/2014  . Asthma 12/14/2008  . Diabetic peripheral neuropathy associated with type 2 diabetes mellitus ([place]) 08/06/2008  . Hyperlipemia 11/12/2006  . Essential hypertension 11/12/2006    Past Medical History:  Diagnosis Date  . Anemia 12/14/2008  . Anxiety  and depression 12/14/2008  . Asthma 11/12/2006  . CAD (coronary artery disease) 12/14/2008   hx transluminal coronary angioplasty  . Cataract   . Depression   . Diabetes mellitus with neuropathy ([place]) 12/14/2008  . Fibromyalgia 01/04/2007   sees rheuamtology  . 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 Hyperlipemia 01/21/2010  . Hypertension 12/14/2008  . Leg edema 12/14/2008  . 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 OSA (obstructive sleep apnea) 08/06/2008  . Osteoarthritis 12/14/2008  . Palpitations 11/12/2006   hx sinus tachy  . Psoriatic arthritis [facility])    sees rheumatology  . Rotator cuff injury     Family History  Problem Relation Age of Onset  . Arthritis Mother   . Alzheimer's disease Mother   . Stroke Father   . Alcohol abuse Father   . Diabetes Father   . Heart disease Father   . Early death Father 48  . Diabetes Sister   . Arthritis Sister   . COPD Sister   . Arthritis Maternal Aunt   . Diabetes Maternal Aunt   . Early death Maternal Aunt 79  . Diabetes Maternal Uncle   . Cancer Paternal Aunt   . COPD Paternal 38   . Heart disease Paternal Aunt   . Heart disease Paternal Uncle   . Alcohol abuse Paternal Grandfather   . Heart disease Paternal Grandfather   . Early death Paternal Grandfather 32  . Stroke Paternal Grandfather   . Hypertension Son   . Autoimmune disease Daughter   . Breast cancer Neg Hx    Past Surgical History:  Procedure Laterality Date  . ABDOMINAL HYSTERECTOMY    . CHOLECYSTECTOMY    . LAPAROSCOPIC GASTRIC BANDING    . Lumbar Facet Joint Intra-Articular Injection(s) with Fluoroscopic Guidance  09/13/2020   L4-5; Dr. Ernestina Patches  . PTCA    . WRIST SURGERY     LEFT   Social History   Social History Narrative   Married, [name]. 2 children name Abe People and Anderson [place].   Some college, retired Engineer, manufacturing.   Drinks caffeine.   Take a daily vitamin.   Wears her seatbelt, exercises 3 times a week.   Smoke detector in the home.    Firearms in the home.   Feels safe in her relationships.   Immunization History  Administered Date(s) Administered  . Fluad Quad(high Dose 65+) 12/03/2018, 01/29/2020  . Influenza Split 01/21/2013  . Influenza, High Dose Seasonal PF 02/04/2015, 02/09/2016, 02/11/2018  . Influenza,inj,Quad PF,6+ Mos 01/15/2014  . Influenza-Unspecified 01/26/2017  . Moderna Sars-Covid-2 Vaccination 05/24/2019, 06/21/2019, 12/22/2019  . PPD Test 11/15/2020  . Pneumococcal Conjugate-13 02/04/2015  . Pneumococcal Polysaccharide-23 10/31/2013, 01/29/2020  . Tdap 02/07/2013     Objective: Vital Signs: BP 128/82 (BP Location: Right Arm, Patient Position: Sitting, Cuff Size: Normal)   Pulse 60   Ht '5\' 3"'$  (1.6 m)   Wt 157 lb 9.6 oz (71.5 kg)   LMP  (LMP Unknown)   BMI 27.92 kg/m    Physical Exam Vitals and nursing note reviewed.  Constitutional:      Appearance: She is well-developed.  HENT:     Head: Normocephalic and atraumatic.  Eyes:     Conjunctiva/sclera: Conjunctivae normal.  Cardiovascular:  Rate and Rhythm: Normal rate and regular rhythm.     Heart sounds: Normal heart sounds.  Pulmonary:     Effort: Pulmonary effort is normal.     Breath sounds: Normal breath sounds.  Abdominal:     General: Bowel sounds are normal.     Palpations: Abdomen is soft.  Musculoskeletal:     Cervical back: Normal range of motion.  Skin:    General: Skin is warm and dry.     Capillary Refill: Capillary refill takes less than 2 seconds.  Neurological:     Mental Status: She is alert and oriented to person, place, and time.  Psychiatric:        Behavior: Behavior normal.     Musculoskeletal Exam: C-spine has limited ROM with lateral rotation.  No midline spinal tenderness.  No SI joint tenderness. Left shoulder has slightly limited ROM.  Right shoulder has good ROM with no discomfort.  Elbow joints, wrist joints, Mcps, PIPs, and DIPs good ROM with no synovitis.  Complete fist formation bilaterally.  Right CMC joint prominence.  Discomfort with ROM of the right hip.  Knee joints have good ROM with no warmth or effusion.  Ankle joints have good ROM with no tenderness or joint swelling.   CDAI Exam: CDAI Score: -- Patient Global: --; Provider Global: -- Swollen: --; Tender: -- Joint Exam 11/09/2021   No joint exam has been documented for this visit   There is currently no information documented on the homunculus. Go to the Rheumatology activity and complete the homunculus joint exam.  Investigation: No additional findings.  Imaging: No results found.  Recent Labs: Lab Results  Component Value Date   WBC 6.7 05/06/2021   HGB 13.5 05/06/2021   PLT 298.0 05/06/2021   NA 139 05/06/2021   K 4.7 05/06/2021   CL 101 05/06/2021   CO2 31 05/06/2021   GLUCOSE 109 (H) 05/06/2021   BUN 26 (H) 05/06/2021   CREATININE 0.76 05/06/2021   BILITOT 0.4 05/06/2021   ALKPHOS 58 05/06/2021   AST 14 05/06/2021   ALT 15 05/06/2021   PROT 6.6 05/06/2021   ALBUMIN 4.2 05/06/2021   CALCIUM 9.6 05/06/2021   GFRAA >60 11/19/2019    Speciality Comments: No specialty comments available.  Procedures:  No procedures performed Allergies: Patient has no known allergies.   Assessment / Plan:     Visit Diagnoses: Psoriatic arthritis ([place]): She has no synovitis or dactylitis on examination today.  She has not had any signs or symptoms of a psoriatic arthritis flare.  She is not currently taking any immunosuppressive agents.  She has no signs or symptoms of uveitis at this time.  She has no evidence of Achilles tendinitis or plantar fasciitis.  No SI joint tenderness upon palpation today.  She does not require treatment for psoriatic arthritis at this time.  She was advised to notify us if she develops any new or worsening symptoms.  She will follow-up in the office in 6 months or sooner if needed.  Psoriasis: She experiences intermittent patches of psoriasis on her scalp.  She has been using a topical  over-the-counter agent which has been managing her symptoms.  She declined a prescription for clobetasol at this time.  She was advised to notify us if she develops any other new or worsening patches.  Primary osteoarthritis of both hands: She has PIP and DIP thickening consistent with osteoarthritis of both hands.  Right CMC joint prominence and thickening noted.  Complete fist  formation bilaterally.  Trochanteric bursitis, right hip: She has intermittent discomfort in the right hip.  She had a right trochanteric bursa cortisone injection on 03/13/2021.  She had a right hip injection performed by Dr. Ernestina Patches on 06/06/2021 which alleviated her discomfort.  Pain in both feet: She is not experiencing any increased discomfort in her feet at this time.  She is good range of motion of both ankle joints with no tenderness or synovitis.  No evidence of Achilles tendinitis or plantar fasciitis.  Dystonia: Followed by Dr. Carles Collet.  She has cervical dystonia and dystonic hand tremors.  Patient tried Botox which has been helpful for her pain.  She has been performing home isometric exercises to improve her range of motion.  DDD (degenerative disc disease), cervical -She has been experiencing chronic pain in her neck since October 2022.  She has been under the care of orthopedics as well as neurology.  She had a CT scan of her cervical spine in October 2022.  MRI of the C-spine obtained on 07/27/2021 revealed generalized cervical spine degeneration with notable active facet arthritis at left C1-C2.  According to the patient she is not a good candidate for injections.  She has tried 8 weeks of physical therapy with no improvement in her symptoms.  She has been performing home exercises and has noticed some improvement with range of motion.  On examination today she has limited range of motion especially with lateral rotation.  DDD (degenerative disc disease), lumbar - She is followed by Dr. Ernestina Patches.  She had a facet injection  on 09/13/20, which provided significant pain relief.   History of scoliosis  Fibromyalgia: She has intermittent myalgias and muscle tenderness due to fibromyalgia.  She takes cymbalta 60 mg daily, tizanidine 4 mg every 8 hours as needed for muscle spasms and remains on Lyrica 75 mg twice daily.  Other fatigue: Chronic and secondary to insomnia.  Discussed importance of regular exercise and good sleep hygiene.  Other insomnia: She takes Ambien 5 mg 1 tablet at bedtime for insomnia.  A refill of Ambien will be sent to the pharmacy today to be filled no sooner than 11/13/2021.  Other medical conditions are listed as follows:   History of diabetes mellitus  Gastric bypass status for obesity  History of vitamin D deficiency  Anxiety and depression  History of sleep apnea  Coarse tremors  Gait instability  History of asthma  Osteoporosis screening - DEXA updated on 11/25/20:The BMD measured at Forearm Radius 33% is 0.842 g/cm2 with a T-score of -0.5.  Orders: No orders of the defined types were placed in this encounter.  No orders of the defined types were placed in this encounter.   Follow-Up Instructions: Return in about 6 months (around 05/12/2022) for Psoriatic arthritis, Osteoarthritis.   Ofilia Neas, PA-C  Note - This record has been created using Dragon software.  Chart creation errors have been sought, but may not always  have been located. Such creation errors do not reflect on  the standard of medical care.

## 2021-10-31 ENCOUNTER — Ambulatory Visit: Payer: Medicare Other | Admitting: Physician Assistant

## 2021-11-02 ENCOUNTER — Ambulatory Visit: Payer: Medicare Other | Admitting: Physician Assistant

## 2021-11-08 ENCOUNTER — Ambulatory Visit: Payer: Medicare Other | Admitting: Physician Assistant

## 2021-11-09 ENCOUNTER — Ambulatory Visit: Payer: Medicare Other | Attending: Physician Assistant | Admitting: Physician Assistant

## 2021-11-09 ENCOUNTER — Other Ambulatory Visit: Payer: Self-pay

## 2021-11-09 ENCOUNTER — Encounter: Payer: Self-pay | Admitting: Physician Assistant

## 2021-11-09 VITALS — BP 128/82 | HR 60 | Ht 63.0 in | Wt 157.6 lb

## 2021-11-09 DIAGNOSIS — L4050 Arthropathic psoriasis, unspecified: Secondary | ICD-10-CM | POA: Diagnosis not present

## 2021-11-09 DIAGNOSIS — R2681 Unsteadiness on feet: Secondary | ICD-10-CM | POA: Diagnosis not present

## 2021-11-09 DIAGNOSIS — F419 Anxiety disorder, unspecified: Secondary | ICD-10-CM | POA: Insufficient documentation

## 2021-11-09 DIAGNOSIS — G252 Other specified forms of tremor: Secondary | ICD-10-CM | POA: Insufficient documentation

## 2021-11-09 DIAGNOSIS — Z13820 Encounter for screening for osteoporosis: Secondary | ICD-10-CM | POA: Insufficient documentation

## 2021-11-09 DIAGNOSIS — M19041 Primary osteoarthritis, right hand: Secondary | ICD-10-CM | POA: Diagnosis not present

## 2021-11-09 DIAGNOSIS — G249 Dystonia, unspecified: Secondary | ICD-10-CM | POA: Insufficient documentation

## 2021-11-09 DIAGNOSIS — Z9884 Bariatric surgery status: Secondary | ICD-10-CM | POA: Diagnosis not present

## 2021-11-09 DIAGNOSIS — F32A Depression, unspecified: Secondary | ICD-10-CM | POA: Diagnosis not present

## 2021-11-09 DIAGNOSIS — R5383 Other fatigue: Secondary | ICD-10-CM | POA: Diagnosis not present

## 2021-11-09 DIAGNOSIS — Z8739 Personal history of other diseases of the musculoskeletal system and connective tissue: Secondary | ICD-10-CM | POA: Diagnosis not present

## 2021-11-09 DIAGNOSIS — Z8669 Personal history of other diseases of the nervous system and sense organs: Secondary | ICD-10-CM | POA: Diagnosis not present

## 2021-11-09 DIAGNOSIS — Z8639 Personal history of other endocrine, nutritional and metabolic disease: Secondary | ICD-10-CM | POA: Insufficient documentation

## 2021-11-09 DIAGNOSIS — Z8709 Personal history of other diseases of the respiratory system: Secondary | ICD-10-CM | POA: Insufficient documentation

## 2021-11-09 DIAGNOSIS — M79671 Pain in right foot: Secondary | ICD-10-CM | POA: Diagnosis not present

## 2021-11-09 DIAGNOSIS — M5136 Other intervertebral disc degeneration, lumbar region: Secondary | ICD-10-CM | POA: Diagnosis not present

## 2021-11-09 DIAGNOSIS — M79672 Pain in left foot: Secondary | ICD-10-CM | POA: Insufficient documentation

## 2021-11-09 DIAGNOSIS — M5030 Other cervical disc degeneration, unspecified cervical region: Secondary | ICD-10-CM | POA: Diagnosis not present

## 2021-11-09 DIAGNOSIS — M19042 Primary osteoarthritis, left hand: Secondary | ICD-10-CM | POA: Diagnosis not present

## 2021-11-09 DIAGNOSIS — M797 Fibromyalgia: Secondary | ICD-10-CM | POA: Diagnosis not present

## 2021-11-09 DIAGNOSIS — L409 Psoriasis, unspecified: Secondary | ICD-10-CM | POA: Insufficient documentation

## 2021-11-09 DIAGNOSIS — M7061 Trochanteric bursitis, right hip: Secondary | ICD-10-CM | POA: Insufficient documentation

## 2021-11-09 DIAGNOSIS — G4709 Other insomnia: Secondary | ICD-10-CM | POA: Insufficient documentation

## 2021-11-09 MED ORDER — ZOLPIDEM TARTRATE 5 MG PO TABS
ORAL_TABLET | ORAL | 0 refills | Status: DC
Start: 2021-11-09 — End: 2021-12-13

## 2021-11-09 NOTE — Telephone Encounter (Signed)
Please review and send pended ambien prescription that was requested today at patient's appointment. I have noted on the prescription, do not fill until 11/13/2021. Thanks!

## 2021-11-15 ENCOUNTER — Ambulatory Visit: Payer: Medicare Other | Admitting: Physician Assistant

## 2021-11-15 ENCOUNTER — Ambulatory Visit: Payer: Medicare Other | Admitting: Sports Medicine

## 2021-11-15 NOTE — Progress Notes (Deleted)
Assessment/Plan:   1.  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  Talked about nature and pathophysiology of both of these things.  Talked about the fact that dystonic hand tremor really does not respond very well to treatment, and ultimately we decided to hold off on any medications for this.             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  she would like to do botox for this b/c of difficulty moving head driving.  we will start with 200 U of botox.  We talked about the value of botox.  I discussed with the patient that this will not help the pain in her neck due to the facet arthrosis seen on her MRI cervical spine.  Discussed that this was a completely separate issue.  Patient expressed understanding.  She still wants to proceed.  The patient was educated on the botulinum toxin the black blox warning and given a copy of the botox patient medication guide.  The patient understands that this warning states that there have been reported cases of the Botox extending beyond the injection site and creating adverse effects, similar to those of botulism. This included loss of strength, trouble walking, hoarseness, trouble saying words clearly, loss of bladder control, trouble breathing, trouble swallowing, diplopia, blurry vision and ptosis. Most of the distant spread of Botox was happening in patients, primarily children, who received medication for spasticity or for cervical dystonia.    2.  Diabetic neuropathy             -Clinically, this appears to be very mild, especially given the fact that she has had diabetes for 30 years.             -on gabapentin, 300 mg q hs             -Patient and I did discuss over-the-counter lidocaine cream.  We discussed compounding cream as well.             -Discussed that her balance really is likely not from the  diabetic neuropathy.  Appears more orthopedic in nature.  She does not disagree.  Was already seen orthopedics for her hip.   3.  Cervical degenerative changes with facet arthrosis  -As above, discussed clearly with patient that this is a very separate issue from cervical dystonia, although both can cause pain.  She has seen orthopedics for this, although that office apparently does not do injections at the C1-C2 level.  She would have to go to neurosurgery for that.   Subjective:   Priscilla [name] was seen today in follow up for dystonia.  The patient has chronic neck pain.  She went to orthopedics.  She saw the nurse practitioner.  She had an MRI of the cervical spine.  There is no central canal stenosis.  She had facet arthrosis on the left at C1-C2 and degenerative neuroforaminal stenosis at several levels.  The initial notes from the nurse practitioner stated that they would consider facet injections, but when she followed back up, they stated that they do not perform the C1-C2 injections in their office and were unsure if other providers in the area did this (there are) and instead she recommended that she come back here for Botox.  Patient then called and asked to  proceed with Botox injections.  We asked for a follow-up appointment, as there is a very big difference between dystonia and facet arthrosis and goals with injections.  She does state it is also difficult for her to tell the difference in the pain but the head motion is limited when driving.     CURRENT MEDICATIONS:  Outpatient Encounter Medications as of 11/15/2021  Medication Sig   aspirin 81 MG tablet Take 81 mg by mouth daily.   atorvastatin (LIPITOR) 40 MG tablet Take 1 tablet (40 mg total) by mouth daily.   calcium-vitamin D 250-100 MG-UNIT per tablet Take 1 tablet by mouth daily.    COLLAGEN PO Take by mouth. Adds to tea daily   Continuous Blood Gluc Receiver (FREESTYLE LIBRE 2 READER) DEVI Use as instructed to check blood  sugar 4X daily   Continuous Blood Gluc Sensor (FREESTYLE LIBRE 2 SENSOR) MISC Use sensors with CGM to check sugars.   Cyanocobalamin (VITAMIN B12 PO) Place under the tongue.   DULoxetine (CYMBALTA) 60 MG capsule Take 1 capsule (60 mg total) by mouth daily.   fluticasone (FLONASE) 50 MCG/ACT nasal spray Place 2 sprays into both nostrils in the morning and at bedtime.   glucose blood (ONETOUCH VERIO) test strip USE TO TEST BLOOD SUGAR TWICE A DAY DX: E11.319   insulin detemir (LEVEMIR) 100 UNIT/ML injection Inject 0.13-0.15 mLs (13-15 Units total) into the skin at bedtime.   Insulin Pen Needle 32G X 4 MM MISC Use 3x a day for insulin   Insulin Syringe-Needle U-100 (BD INSULIN SYRINGE U/F) 31G X 5/16" 0.5 ML MISC Use 1x a day for insulin   ketoconazole (NIZORAL) 2 % cream Apply to affected area twice daily.   meloxicam (MOBIC) 15 MG tablet Take 15 mg by mouth daily. (Patient not taking: Reported on 11/09/2021)   meloxicam (MOBIC) 15 MG tablet Take 1 tablet (15 mg total) by mouth daily.   metFORMIN (GLUCOPHAGE) 1000 MG tablet TAKE 1 TABLET BY MOUTH TWICE DAILY WITH MEALS   metoprolol tartrate (LOPRESSOR) 25 MG tablet TAKE 1 TABLET(25 MG) BY MOUTH TWICE DAILY   NOVOLOG FLEXPEN 100 UNIT/ML FlexPen Inject 2-4 Units into the skin 3 (three) times daily with meals. (Patient taking differently: Inject 2-6 Units into the skin 3 (three) times daily with meals.)   pravastatin (PRAVACHOL) 40 MG tablet Take 1 tablet (40 mg total) by mouth daily.   pregabalin (LYRICA) 75 MG capsule Take 1 capsule (75 mg total) by mouth 2 (two) times daily.   sertraline (ZOLOFT) 100 MG tablet Take 1 tablet (100 mg total) by mouth daily.   tiZANidine (ZANAFLEX) 4 MG tablet TAKE 1 TABLET(4 MG) BY MOUTH EVERY 8 HOURS AS NEEDED FOR MUSCLE SPASMS   Vitamin D, Ergocalciferol, (DRISDOL) 1.25 MG (50000 UNIT) CAPS capsule TAKE 1 CAPSULE BY MOUTH EVERY 7 DAYS   zolpidem (AMBIEN) 5 MG tablet TAKE 1 TABLET BY MOUTH AT BEDTIME FOR INSOMNIA    No facility-administered encounter medications on file as of 11/15/2021.     Objective:   PHYSICAL EXAMINATION:    VITALS:   There were no vitals filed for this visit.   Gen:  Appears stated age and in NAD. HEENT:  Normocephalic, atraumatic. The mucous membranes are moist. The superficial temporal arteries are without ropiness or tenderness. Cardiovascular: Regular rate and rhythm. Lungs: Clear to auscultation bilaterally. Neck: There are no carotid bruits noted bilaterally.  Head is slightly turned to the right with irregular head jerk/tremor.  There is limited  ROM of the neck L and R   NEUROLOGICAL:   Orientation:  The patient is alert and oriented x 3.   Cranial nerves: There is good facial symmetry. Extraocular muscles are intact and visual fields are full to confrontational testing. Speech is fluent and clear.  Sensation: Sensation is intact to light touch touch throughout  Coordination:  The patient has no dysdiadichokinesia or dysmetria. Motor: Strength is at least antigravity x 4 Gait and Station: The patient has antagalgic gait (relates to R hip pain).  Some astasia abasia quality.       Total time spent on today's visit was 22 minutes, including both face-to-face time and nonface-to-face time.  Time included that spent on review of records (prior notes available to me/labs/imaging if pertinent), discussing treatment and goals, answering patient's questions and coordinating care.  Cc:  Kuneff, Renee A, DO

## 2021-11-17 NOTE — Progress Notes (Signed)
Assessment/Plan:   1.  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  Talked about nature and pathophysiology of both of these things.  Talked about the fact that dystonic hand tremor really does not respond very well to treatment, and ultimately we decided to hold off on any medications for this.             -Her first Botox injections were June 16.  She found them beneficial, but had also started Mobic around the same time for her cervical degenerative changes.  She no longer has the head tremor/jerking, but I am concerned that she had some heaviness of the head.  For right now, she does not wish to change injection pattern and we decided to proceed at least 1 more time to see how she did.   2.  Diabetic neuropathy             -Clinically, this appears to be very mild, especially given the fact that she has had diabetes for 30 years.             -on gabapentin, 300 mg q hs             -Patient and I did discuss over-the-counter lidocaine cream.  We discussed compounding cream as well.             -Discussed that her balance really is likely not from the diabetic neuropathy.  Appears more orthopedic in nature.  She does not disagree.  Was already seen orthopedics for her hip.   3.  Cervical degenerative changes with facet arthrosis  -As above, discussed clearly with patient that this is a very separate issue from cervical dystonia, although both can cause pain.  She has seen orthopedics for this, although that office apparently does not do injections at the C1-C2 level.  She would have to go to neurosurgery for that.  4.  Right hand tremor  -May represent dystonic tremor, but there was a rest component.  I think that we should go ahead and look at a DaTscan just to make sure she is not developing an atypical state.   Subjective:   Priscilla [name] was seen today in Botox follow-up.  First injections were June 16.  She reports today that it took  just over 14 days to kick in and her pcp gave her some medication.  After 14 days of Botox injection, she felt better and could move the head better but her head did feel heavy.   It no longer feels heavy.  The pain is markedly better.  Separately, patient does have cervical degenerative changes for which she has been seeing orthopedics and sports medicine.  Orthopedics declined injections, since it was the C1-C2 level.  She saw sports medicine July 11.  She was started on Mobic and reports that she has 2 days left of this.  CURRENT MEDICATIONS:  Outpatient Encounter Medications as of 11/30/2021  Medication Sig   aspirin 81 MG tablet Take 81 mg by mouth daily.   calcium-vitamin D 250-100 MG-UNIT per tablet Take 1 tablet by mouth daily.    COLLAGEN PO Take by mouth. Adds to tea daily   Continuous Blood Gluc Receiver (FREESTYLE LIBRE 2 READER) DEVI Use as instructed to check blood sugar 4X daily   Continuous Blood Gluc Sensor (FREESTYLE LIBRE 2 SENSOR) MISC Use sensors with CGM to check sugars.  Cyanocobalamin (VITAMIN B12 PO) Place under the tongue.   DULoxetine (CYMBALTA) 60 MG capsule Take 1 capsule (60 mg total) by mouth daily.   fluticasone (FLONASE) 50 MCG/ACT nasal spray Place 2 sprays into both nostrils in the morning and at bedtime.   glucose blood (ONETOUCH VERIO) test strip USE TO TEST BLOOD SUGAR TWICE A DAY DX: E11.319   insulin detemir (LEVEMIR) 100 UNIT/ML injection Inject 0.13-0.15 mLs (13-15 Units total) into the skin at bedtime.   Insulin Pen Needle 32G X 4 MM MISC Use 3x a day for insulin   Insulin Syringe-Needle U-100 (BD INSULIN SYRINGE U/F) 31G X 5/16" 0.5 ML MISC Use 1x a day for insulin   ketoconazole (NIZORAL) 2 % cream Apply to affected area twice daily.   metFORMIN (GLUCOPHAGE) 1000 MG tablet TAKE 1 TABLET BY MOUTH TWICE DAILY WITH MEALS   metoprolol tartrate (LOPRESSOR) 25 MG tablet TAKE 1 TABLET(25 MG) BY MOUTH TWICE DAILY   NOVOLOG FLEXPEN 100 UNIT/ML FlexPen Inject  2-4 Units into the skin 3 (three) times daily with meals. (Patient taking differently: Inject 2-6 Units into the skin 3 (three) times daily with meals.)   pravastatin (PRAVACHOL) 40 MG tablet Take 1 tablet (40 mg total) by mouth daily.   pregabalin (LYRICA) 75 MG capsule Take 1 capsule (75 mg total) by mouth 2 (two) times daily.   sertraline (ZOLOFT) 100 MG tablet Take 1 tablet (100 mg total) by mouth daily.   tiZANidine (ZANAFLEX) 4 MG tablet TAKE 1 TABLET(4 MG) BY MOUTH EVERY 8 HOURS AS NEEDED FOR MUSCLE SPASMS   Vitamin D, Ergocalciferol, (DRISDOL) 1.25 MG (50000 UNIT) CAPS capsule TAKE 1 CAPSULE BY MOUTH EVERY 7 DAYS   zolpidem (AMBIEN) 5 MG tablet TAKE 1 TABLET BY MOUTH AT BEDTIME FOR INSOMNIA   atorvastatin (LIPITOR) 40 MG tablet Take 1 tablet (40 mg total) by mouth daily. (Patient not taking: Reported on 11/30/2021)   meloxicam (MOBIC) 15 MG tablet Take 15 mg by mouth daily. (Patient not taking: Reported on 11/09/2021)   meloxicam (MOBIC) 15 MG tablet Take 1 tablet (15 mg total) by mouth daily. (Patient not taking: Reported on 11/30/2021)   No facility-administered encounter medications on file as of 11/30/2021.     Objective:   PHYSICAL EXAMINATION:    VITALS:   Vitals:   11/30/21 0912  BP: 124/72  Pulse: 67  SpO2: 97%  Weight: 156 lb (70.8 kg)  Height: '5\' 3"'$  (1.6 m)     Gen:  Appears stated age and in NAD. HEENT:  Normocephalic, atraumatic. The mucous membranes are moist. The superficial temporal arteries are without ropiness or tenderness. Neck: There are no carotid bruits noted bilaterally.  Head is slightly turned to the right but no longer head jerking/tremor   NEUROLOGICAL:   Orientation:  The patient is alert and oriented x 3.   Cranial nerves: There is good facial symmetry. Extraocular muscles are intact and visual fields are full to confrontational testing. Speech is fluent and clear.  Sensation: Sensation is intact to light touch touch throughout  Coordination:   The patient has slowness of RAMs, R>L Motor: Strength is at least antigravity x 4 Gait and Station: The patient has antagalgic gait (relates to R hip pain).  Some astasia abasia quality.  This is same as previous. Tone:  mild increased in RUE Abnormal movements:  there is just a bit of RUE rest tremor in the R thumb     Total time spent on today's visit was 25  minutes, including both face-to-face time and nonface-to-face time.  Time included that spent on review of records (prior notes available to me/labs/imaging if pertinent), discussing treatment and goals, answering patient's questions and coordinating care.  Cc:  Kuneff, Renee A, DO

## 2021-11-23 ENCOUNTER — Ambulatory Visit: Payer: Medicare Other | Admitting: Sports Medicine

## 2021-11-29 DIAGNOSIS — M7912 Myalgia of auxiliary muscles, head and neck: Secondary | ICD-10-CM | POA: Diagnosis not present

## 2021-11-30 ENCOUNTER — Encounter: Payer: Self-pay | Admitting: Neurology

## 2021-11-30 ENCOUNTER — Ambulatory Visit (INDEPENDENT_AMBULATORY_CARE_PROVIDER_SITE_OTHER): Payer: Medicare Other | Admitting: Neurology

## 2021-11-30 VITALS — BP 124/72 | HR 67 | Ht 63.0 in | Wt 156.0 lb

## 2021-11-30 DIAGNOSIS — G243 Spasmodic torticollis: Secondary | ICD-10-CM

## 2021-11-30 NOTE — Patient Instructions (Signed)
As we discussed, we are going to do a DaT scan.  We discussed that this is not a diagnostic scan, but will just give us some information on dopamine levels in the brain.  Here is some information which may be helpful to you.  Before the Exam  Please tell the nurse, nuclear imaging technician or nuclear medicine physician if you are pregnant, nursing or have reduced liver function. Please also inform us if you have an allergy or sensitivity to iodine.  The test may be completed with those who are allergic to iodine, but may require pre-medication with other medications to help avoid reactions. If you need to cancel the examination, please give us at least 24 hours notice.  Before your scan, stop taking these medicines for the length of time shown: Name of Drug Stop Taking  Amoxapine 4 days before  Benztropine  Cogentin 3 days before  Bupropion (Aplenzin, Budeprion, Voxra, Wellbutrin, Zyban) 48 hours before  Buspirone 15 hours before  Citalopram 24 hours before  Cocaine 6 hours before  Escitalopram 24 hours before  Methamphetamine 24 hours before  Methylphenidate (Concerta, Metadate, Methylin, Ritalin) 20 hours before  Paroxetine 24 hours before  Selegilene 48 hours before  Sertraline 3 days before    On the Day of the Exam Drink plenty of fluids and go to the bathroom frequently (and for two days after your exam) Wear loose comfortable clothing, since you will need to lie still for a period of time. Please bring a list of all medications that you are taking; name and dosage. We want to make your waiting time as pleasant as possible. Consider bringing your favorite magazine, book or music player to help you pass the time.  You do not need to stay at the imaging facility the entire time, between the initial injection and the scan itself.   Please leave your jewelry and valuables at home.  During the Exam The DaTscan once started takes approximately 30-45 minutes. However, following  injection of the DaT agent approximately 3-6 hours are required before the agent has achieved appropriate concentration in the brain.  We will inject the DaTscan through an intravenous (IV) line into your arm in the AM, usually around 8-9am, and then you will come back usually in the mid afternoon for the scan. Before the exam, you will receive a drug to allow you to protect the thyroid. For the imaging test, you will be asked to lie on a table and an imaging technologist will position your head in a headrest. A strip of tape or a flexible restraint may be placed around your head to help you to not move your head during the scan. A camera will be positioned above you and you must remain very still for about 30 minute while images are taken. The scanner will be very close to your head, but will not touch your head.  

## 2021-12-01 ENCOUNTER — Telehealth: Payer: Self-pay

## 2021-12-01 DIAGNOSIS — R251 Tremor, unspecified: Secondary | ICD-10-CM

## 2021-12-01 NOTE — Telephone Encounter (Signed)
Received notification that patients Dat Scan does not require a PA and is ready to be scheduled. Sent message to Eino Farber for scheduling.

## 2021-12-06 ENCOUNTER — Other Ambulatory Visit: Payer: Self-pay

## 2021-12-06 ENCOUNTER — Other Ambulatory Visit: Payer: Self-pay | Admitting: Neurology

## 2021-12-13 ENCOUNTER — Other Ambulatory Visit: Payer: Self-pay | Admitting: Physician Assistant

## 2021-12-13 NOTE — Telephone Encounter (Signed)
Next Visit: 05/16/2022  Last Visit: 11/09/2021  Last Fill:11/09/2021  Dx: Other insomnia  Current Dose per office note on 11/09/2021: Ambien 5 mg 1 tablet at bedtime for insomnia  Okay to refill Ambien?

## 2021-12-14 ENCOUNTER — Encounter: Payer: Self-pay | Admitting: Physical Medicine and Rehabilitation

## 2021-12-14 ENCOUNTER — Ambulatory Visit (INDEPENDENT_AMBULATORY_CARE_PROVIDER_SITE_OTHER): Payer: Medicare Other | Admitting: Internal Medicine

## 2021-12-14 ENCOUNTER — Telehealth: Payer: Self-pay | Admitting: Physical Medicine and Rehabilitation

## 2021-12-14 ENCOUNTER — Encounter: Payer: Self-pay | Admitting: Internal Medicine

## 2021-12-14 VITALS — BP 120/84 | HR 62 | Ht 63.0 in | Wt 157.2 lb

## 2021-12-14 DIAGNOSIS — Z794 Long term (current) use of insulin: Secondary | ICD-10-CM

## 2021-12-14 DIAGNOSIS — E11319 Type 2 diabetes mellitus with unspecified diabetic retinopathy without macular edema: Secondary | ICD-10-CM

## 2021-12-14 DIAGNOSIS — M25551 Pain in right hip: Secondary | ICD-10-CM

## 2021-12-14 DIAGNOSIS — E1142 Type 2 diabetes mellitus with diabetic polyneuropathy: Secondary | ICD-10-CM

## 2021-12-14 DIAGNOSIS — E785 Hyperlipidemia, unspecified: Secondary | ICD-10-CM | POA: Diagnosis not present

## 2021-12-14 LAB — POCT GLYCOSYLATED HEMOGLOBIN (HGB A1C): Hemoglobin A1C: 6.9 % — AB (ref 4.0–5.6)

## 2021-12-14 NOTE — Progress Notes (Signed)
Patient ID: Priscilla [name], female   DOB: [birth year], 71 y.o.   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 She previously saw Drs Priscilla [name] (distant past) and Priscilla [name] (more recently). Last visit with me 4 months ago.  Interim history: She has a lot of stress at home due to her husband who has vascular dementia. He will have back Sx. She has urinary incontinence, no blurry vision, chest pain. Occasional nausea. 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  She sees neurology. She has hip pain >> had steroid inj's. She has pbs with defective CGM sensors.  Reviewed HbA1c levels: Lab Results  Component Value Date   HGBA1C 7.0 (A) 08/09/2021   HGBA1C 6.6 (A) 03/07/2021   HGBA1C 6.7 (A) 10/29/2020   HGBA1C 6.2 (A) 06/07/2020   HGBA1C 6.3 (A) 12/30/2019   HGBA1C 6.3 (A) 08/25/2019   HGBA1C 5.9 (A) 04/24/2019   HGBA1C 6.0 (A) 02/13/2019   HGBA1C 6.1 (A) 09/20/2018   HGBA1C 6.4 (A) 05/23/2018   HGBA1C 6.0 (A) 10/17/2017   HGBA1C 6.3 06/13/2017   HGBA1C 5.9 11/24/2016   HGBA1C 6.5 (H) 04/22/2016   HGBA1C 7.4 05/18/2015  03/16/2017: HbA1c calculated from fructosamine: 5.6% 10/18/2015: HbA1c calculated from fructosamine: 5.7% 02/2015: HbA1c 7.4%, HbA1c calculated from fructosamine: 5.6%  She is on: - Metformin 1000 mg 2x a day with meals - Levemir 30 >> .Marland Kitchen. 15 >> 13 >> 10-13 >> 13-15 >> 15 units at bedtime - Mealtime Novolog:  (3) 4-5 >> 4-6 before meals If sugars before the meal are 60 or lower, please do not take the Novolog dose. If sugars before the meal are 61-80, take only half of the Novolog dose. She was on Victoza 2.5 mg daily >> stopped as she could not afford this.  Pt.checks her sugars more than 4 times a day with her CGM:   Prev.:   Previously:   Previously:  Lowest sugar was 42  >> ... 59 >> 70s; she has hypoglycemia awareness in the in the 70s. Highest sugar was300 (steroids) >> 258 >> 250  >> 280 (steroids).  Glucometer: Freestyle  -No CKD; last BUN/creatinine:  Lab Results  Component Value Date   BUN 26 (H) 05/06/2021   CREATININE 0.76 05/06/2021  ACR (12/2014): 6.9 On lisinopril 1.25 mg daily  -+ HL; last set of lipids: Lab Results  Component Value Date   CHOL 215 (H) 05/06/2021   HDL 60.70 05/06/2021   LDLCALC 131 (H) 05/06/2021   TRIG 114.0 05/06/2021   CHOLHDL 4 05/06/2021  On pravastatin 40.  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  She has floaters.  -+ numbness, tingling, burning in her feet - stable.  On B12.  I recommended alpha lipoic acid but she did not take this due to the large size of the pill.  Previously on Neurontin 100 mg daily per podiatry, now seldom, 2/2 being also on Ambien.  Last foot exam 08/09/2021.  Latest TSH normal: Lab Results  Component Value Date   TSH 2.00 05/06/2021   She has a history of lap band surgery >> she does not usually eat large meals but she usually grazes, especially at night  ROS: Neurological: + tremors/+ numbness/+ tingling/no dizziness  I reviewed pt's  medications, allergies, PMH, social hx, family hx, and changes were documented in the history of present illness. Otherwise, unchanged from my initial visit note.  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 Ernestina [name]   PTCA     WRIST SURGERY     LEFT   Social History   Social History   Marital Status: Married    Spouse Name: N/A   Number of Children: N/A   Occupational History   Not on file.   Social History Main Topics   Smoking status: Never Smoker    Smokeless tobacco: Never Used   Alcohol Use: Yes     Comment: glass of wine-specially occasion   Drug Use: No   Current Outpatient Medications on File Prior to Visit  Medication Sig Dispense Refill   aspirin 81 MG tablet Take 81 mg by mouth daily.     atorvastatin (LIPITOR) 40 MG tablet Take 1 tablet (40 mg total) by mouth daily. (Patient not taking: Reported on 11/30/2021) 90 tablet 3   calcium-vitamin D 250-100 MG-UNIT per tablet Take 1 tablet by mouth daily.      COLLAGEN PO Take by mouth. Adds to tea daily     Continuous Blood Gluc Receiver (FREESTYLE LIBRE 2 READER) DEVI Use as instructed to check blood sugar 4X daily 1 each 0   Continuous Blood Gluc Sensor (FREESTYLE LIBRE 2 SENSOR) MISC Use sensors with CGM to check sugars. 6 each 3   Cyanocobalamin (VITAMIN B12 PO) Place under the tongue.     DULoxetine (CYMBALTA) 60 MG capsule Take 1 capsule (60 mg total) by mouth daily. 30 capsule 5   fluticasone (FLONASE) 50 MCG/ACT nasal spray Place 2 sprays into both nostrils in the morning and at bedtime. 16 g 6   glucose blood (ONETOUCH VERIO) test strip USE TO TEST BLOOD SUGAR TWICE A DAY DX: E11.319 150 each 3   insulin detemir (LEVEMIR) 100 UNIT/ML injection Inject 0.13-0.15 mLs (13-15 Units total) into the skin at bedtime. 30 mL 3   Insulin Pen  Needle 32G X 4 MM MISC Use 3x a day for insulin 300 each 3   Insulin Syringe-Needle U-100 (BD INSULIN SYRINGE U/F) 31G X 5/16" 0.5 ML MISC Use 1x a day for insulin 100 each 3   ketoconazole (NIZORAL) 2 % cream Apply to affected area twice daily. 60 g 3   meloxicam (MOBIC) 15 MG tablet Take 15 mg by mouth daily. (Patient not taking: Reported on 11/09/2021)     meloxicam (MOBIC) 15 MG tablet Take 1 tablet (15 mg total) by mouth daily. (Patient not  taking: Reported on 11/30/2021) 30 tablet 0   metFORMIN (GLUCOPHAGE) 1000 MG tablet TAKE 1 TABLET BY MOUTH TWICE DAILY WITH MEALS 180 tablet 3   metoprolol tartrate (LOPRESSOR) 25 MG tablet TAKE 1 TABLET(25 MG) BY MOUTH TWICE DAILY 60 tablet 5   NOVOLOG FLEXPEN 100 UNIT/ML FlexPen Inject 2-4 Units into the skin 3 (three) times daily with meals. (Patient taking differently: Inject 2-6 Units into the skin 3 (three) times daily with meals.) 15 mL 2   pravastatin (PRAVACHOL) 40 MG tablet Take 1 tablet (40 mg total) by mouth daily. 30 tablet 11   pregabalin (LYRICA) 75 MG capsule Take 1 capsule (75 mg total) by mouth 2 (two) times daily. 60 capsule 5   sertraline (ZOLOFT) 100 MG tablet Take 1 tablet (100 mg total) by mouth daily. 30 tablet 5   tiZANidine (ZANAFLEX) 4 MG tablet TAKE 1 TABLET(4 MG) BY MOUTH EVERY 8 HOURS AS NEEDED FOR MUSCLE SPASMS 90 tablet 5   Vitamin D, Ergocalciferol, (DRISDOL) 1.25 MG (50000 UNIT) CAPS capsule TAKE 1 CAPSULE BY MOUTH EVERY 7 DAYS 4 capsule 6   zolpidem (AMBIEN) 5 MG tablet TAKE 1 TABLET BY MOUTH AT BEDTIME FOR INSOMNIA 30 tablet 0   No current facility-administered medications on file prior to visit.   No Known Allergies Family History  Problem Relation Age of Onset   Arthritis Mother    Alzheimer's disease Mother    Stroke Father    Alcohol abuse Father    Diabetes Father    Heart disease Father    Early death Father 110   Diabetes Sister    Arthritis Sister    COPD Sister    Arthritis Maternal Aunt    Diabetes  Maternal Aunt    Early death Maternal Aunt 50   Diabetes Maternal Uncle    Cancer Paternal Aunt    COPD Paternal Aunt    Heart disease Paternal Aunt    Heart disease Paternal Uncle    Alcohol abuse Paternal Grandfather    Heart disease Paternal Grandfather    Early death Paternal Grandfather 45   Stroke Paternal Grandfather    Hypertension Son    Autoimmune disease Daughter    Breast cancer Neg Hx    PE: BP 120/84 (BP Location: Right Arm, Patient Position: Sitting, Cuff Size: Normal)   Pulse 62   Ht '5\' 3"'$  (1.6 m)   Wt 157 lb 3.2 oz (71.3 kg)   LMP  (LMP Unknown)   SpO2 95%   BMI 27.85 kg/m   Wt Readings from Last 3 Encounters:  12/14/21 157 lb 3.2 oz (71.3 kg)  11/30/21 156 lb (70.8 kg)  11/09/21 157 lb 9.6 oz (71.5 kg)   Constitutional: Normal weight, in NAD Eyes: EOMI, no exophthalmos ENT: moist mucous membranes, no thyromegaly, no cervical lymphadenopathy Cardiovascular: RRR, No MRG Respiratory: CTA B Musculoskeletal: no deformities Skin: moist, warm, no rashes Neurological: + mild tremor with outstretched hands  ASSESSMENT: 1. DM2, insulin-dependent, uncontrolled, with complications - Diabetic retinopathy left eye - resolved - Peripheral neuropathy  Component     Latest Ref Rng & Units 04/24/2019  Hemoglobin A1C     4.0 - 5.6 % 5.9 (A)  Islet Cell Ab     Neg:<1:1 Negative  ZNT8 Antibodies     U/mL <15  C-Peptide     0.80 - 3.85 ng/mL 2.35  Glucose, Plasma     65 - 99 mg/dL 130 (H)  Glutamic Acid Decarb Ab     <  5 IU/mL <5  No insulin deficiency or pancreatic autoimmunity.  2. HL  3. PN   PLAN:  1. Patient with longstanding, fairly well-controlled type 2 diabetes, on metformin and basal/bolus insulin regimen, with improved control over the last few years, especially after her gastric bypass surgery.  However, at last visit, HbA1c was higher, at 7.0%.  At that time, sugars are well controlled during the night, but they were increasing after lunch  especially after eating out and they were occasionally higher after dinner.  We discussed about limiting eating out, but if she did, to take a higher dose of NovoLog before these meals.   CGM interpretation: -At today's visit, we reviewed her CGM downloads: It appears that 87% of values are in target range (goal >70%), while 12% are higher than 180 (goal <25%), and 1% are lower than 70 (goal <4%).  The calculated average blood sugar is 136.  The projected HbA1c for the next 3 months (GMI) is 26%. -Reviewing the CGM trends, sugars are dropping overnight, sometimes lower than 70, and they are increasing slightly after breakfast and lunch, but more significantly after dinner.  She has her largest meal at night.  She extend this over 2 hours.  We discussed that she may need a higher dose of NovoLog before this meal, but first, I suggested to move the Levemir from bedtime to morning to hopefully avoid low blood sugars overnight and help with the blood sugars throughout the day.  I advised her how to move the dose safely.  If this is not working well for her, we can split the Levemir dose at next visit. - I advised her to:  Patient Instructions  Please continue: - Metformin 1000 mg 2x a day with meals - Levemir 15 units daily >> move this to am - Novolog 4-6 units 15 minutes before meals, but 7-8 units before a large meal or if eating out  Please come back for a follow-up appointment in 4 months.  - we checked her HbA1c: 6.9% (slightly lower) - advised to check sugars at different times of the day - 4x a day, rotating check times - advised for yearly eye exams >> she is not UTD - return to clinic in 4 months   2. HL -Reviewed latest lipid panel from 04/2021: LDL above target: Lab Results  Component Value Date   CHOL 215 (H) 05/06/2021   HDL 60.70 05/06/2021   LDLCALC 131 (H) 05/06/2021   TRIG 114.0 05/06/2021   CHOLHDL 4 05/06/2021  -She continues on pravastatin 40 mg daily-no side effects.     3. PN -2/2 diabetes -Stable -I suggested alpha-lipoic acid in the past but she could not take it due to the large tablet size -She continues on B12 vitamin, Cymbalta, and occasional gabapentin per podiatry.  These are helping.  Philemon Kingdom, MD PhD [facility] Endocrinology

## 2021-12-14 NOTE — Patient Instructions (Addendum)
Please continue: - Metformin 1000 mg 2x a day with meals - Levemir 15 units daily >> move this to am - Novolog 4-6 units 15 minutes before meals, but 7-8 units before a large meal or if eating out  Please come back for a follow-up appointment in 4 months.

## 2021-12-14 NOTE — Telephone Encounter (Signed)
Made a telephone encounter from my chart encounter and placed referral for repeat right hip injection.

## 2021-12-19 DIAGNOSIS — H43813 Vitreous degeneration, bilateral: Secondary | ICD-10-CM | POA: Diagnosis not present

## 2021-12-19 DIAGNOSIS — H40022 Open angle with borderline findings, high risk, left eye: Secondary | ICD-10-CM | POA: Diagnosis not present

## 2021-12-19 DIAGNOSIS — H25813 Combined forms of age-related cataract, bilateral: Secondary | ICD-10-CM | POA: Diagnosis not present

## 2021-12-19 DIAGNOSIS — E113293 Type 2 diabetes mellitus with mild nonproliferative diabetic retinopathy without macular edema, bilateral: Secondary | ICD-10-CM | POA: Diagnosis not present

## 2021-12-19 LAB — HM DIABETES EYE EXAM

## 2021-12-20 ENCOUNTER — Encounter (HOSPITAL_COMMUNITY)
Admission: RE | Admit: 2021-12-20 | Discharge: 2021-12-20 | Disposition: A | Discharge status: Home / Self Care | Payer: Medicare Other | Source: Ambulatory Visit | Attending: Neurology | Admitting: Neurology

## 2021-12-20 DIAGNOSIS — R251 Tremor, unspecified: Secondary | ICD-10-CM | POA: Insufficient documentation

## 2021-12-20 MED ORDER — POTASSIUM IODIDE (ANTIDOTE) 130 MG PO TABS
ORAL_TABLET | ORAL | Status: AC
  Filled 2021-12-20: qty 1

## 2021-12-20 MED ORDER — IOFLUPANE I 123 185 MBQ/2.5ML IV SOLN
4.3000 | Freq: Once | INTRAVENOUS | Status: AC | PRN
  Administered 2021-12-20: 4.3 via INTRAVENOUS
  Filled 2021-12-20: qty 5

## 2021-12-23 ENCOUNTER — Ambulatory Visit: Payer: Medicare Other | Admitting: Neurology

## 2021-12-26 DIAGNOSIS — G20 Parkinson's disease: Secondary | ICD-10-CM | POA: Diagnosis not present

## 2021-12-30 ENCOUNTER — Ambulatory Visit (INDEPENDENT_AMBULATORY_CARE_PROVIDER_SITE_OTHER): Payer: Medicare Other | Admitting: Neurology

## 2021-12-30 DIAGNOSIS — G243 Spasmodic torticollis: Secondary | ICD-10-CM | POA: Diagnosis not present

## 2021-12-30 MED ORDER — ONABOTULINUMTOXINA 100 UNITS IJ SOLR
200.0000 [IU] | Freq: Once | INTRAMUSCULAR | Status: AC
  Administered 2021-12-30: 200 [IU] via INTRAMUSCULAR

## 2021-12-30 NOTE — Procedures (Signed)
[facility]   Procedure Note Botox  Attending: Dr. Wells Guiles Priscilla [name]  Preoperative Diagnosis(es): Cervical Dystonia  Result History  Head somewhat heavy. Didn't want to change dose and decided to continue and see how she did  Consent obtained from: The patient Benefits discussed included, but were not limited to decreased muscle tightness, increased joint range of motion, and decreased pain.  Risk discussed included, but were not limited pain and discomfort, bleeding, bruising, excessive weakness, venous thrombosis, muscle atrophy and dysphagia.  A copy of the patient medication guide was given to the patient which explains the blackbox warning.  Patients identity and treatment sites confirmed Yes.  .  Details of Procedure: Skin was cleaned with alcohol.  A 30 gauge, 27m  needle was introduced to the target muscle, except for posterior splenius where 27 gauge, 1.5 inch needle used.   Prior to injection, the needle plunger was aspirated to make sure the needle was not within a blood vessel.  There was no blood retrieved on aspiration.    Following is a summary of the muscles injected  And the amount of Botulinum toxin used:   Dilution 0.9% preservative free saline mixed with 100 u Botox type A to make 10 U per 0.1cc  Injections  Location Left  Right Units Number of sites        Sternocleidomastoid 60  60 1  Splenius Capitus, posterior approach  70 70 1  Splenius Capitus, lateral approach  '30 30 1  '$ Levator Scapulae      Trapezius 10/10 10/10 40 2 each side        TOTAL UNITS:   200    Agent: Botulinum Type A ( Onobotulinum Toxin type A ).  2 vials of Botox were used, each containing 100 units and freshly diluted with 1 mL of sterile, non-preserved saline   Total injected (Units): 200  Total wasted (Units): none wasted   Pt tolerated procedure well without complications.   Reinjection is anticipated in 3 months.

## 2022-01-02 ENCOUNTER — Ambulatory Visit (INDEPENDENT_AMBULATORY_CARE_PROVIDER_SITE_OTHER): Payer: Medicare Other | Admitting: Physical Medicine and Rehabilitation

## 2022-01-02 ENCOUNTER — Ambulatory Visit: Payer: Self-pay

## 2022-01-02 DIAGNOSIS — M25551 Pain in right hip: Secondary | ICD-10-CM | POA: Diagnosis not present

## 2022-01-02 MED ORDER — TRIAMCINOLONE ACETONIDE 40 MG/ML IJ SUSP
40.0000 mg | INTRAMUSCULAR | Status: AC | PRN
  Administered 2022-01-02: 40 mg via INTRA_ARTICULAR

## 2022-01-02 MED ORDER — BUPIVACAINE HCL 0.25 % IJ SOLN
4.0000 mL | INTRAMUSCULAR | Status: AC | PRN
  Administered 2022-01-02: 4 mL via INTRA_ARTICULAR

## 2022-01-02 NOTE — Progress Notes (Signed)
  Numeric Pain Rating Scale and Functional Assessment Average Pain 5   In the last MONTH (on 0-10 scale) has pain interfered with the following?  1. General activity like being  able to carry out your everyday physical activities such as walking, climbing stairs, carrying groceries, or moving a chair?  Rating(7)   +Driver, -BT, -Dye Allergies. 

## 2022-01-02 NOTE — Progress Notes (Signed)
Priscilla [name] - 71 y.o. female MRN [id number]  Date of birth: 1951/02/06  Office Visit Note: Visit Date: 01/02/2022 PCP: Ma Hillock, DO Referred by: Ma Hillock, DO  Subjective: Chief Complaint  Patient presents with   Right Hip - Pain   HPI:  Priscilla [name] is a 71 y.o. female who comes in today for planned repeat Right anesthetic hip arthrogram with fluoroscopic guidance.  The patient has failed conservative care including home exercise, medications, time and activity modification. Prior injection gave more than 50% relief for several months. This injection will be diagnostic and hopefully therapeutic.  Please see requesting physician notes for further details and justification.  Referring: Dr. Jean Rosenthal   ROS Otherwise per HPI.  Assessment & Plan: Visit Diagnoses:    ICD-10-CM   1. Pain in right hip  M25.551 XR C-ARM NO REPORT    Large Joint Inj: R hip joint      Plan: No additional findings.   Meds & Orders: No orders of the defined types were placed in this encounter.   Orders Placed This Encounter  Procedures   Large Joint Inj: R hip joint   XR C-ARM NO REPORT    Follow-up: Return if symptoms worsen or fail to improve.   Procedures: Large Joint Inj: R hip joint on 01/02/2022 9:43 AM Indications: diagnostic evaluation and pain Details: 22 G 3.5 in needle, fluoroscopy-guided anterior approach  Arthrogram: No  Medications: 4 mL bupivacaine 0.25 %; 40 mg triamcinolone acetonide 40 MG/ML Aspirate: 3 mL serous Outcome: tolerated well, no immediate complications  There was excellent flow of contrast producing a partial arthrogram of the hip. The patient did have relief of symptoms during the anesthetic phase of the injection. Procedure, treatment alternatives, risks and benefits explained, specific risks discussed. Consent was given by the patient. Immediately prior to procedure a time out was called to verify the correct patient, procedure,  equipment, support staff and site/side marked as required. Patient was prepped and draped in the usual sterile fashion.          Clinical History: EXAM: MRI CERVICAL SPINE WITHOUT CONTRAST   TECHNIQUE: Multiplanar, multisequence MR imaging of the cervical spine was performed. No intravenous contrast was administered.   COMPARISON:  CT cervical 01/28/2021; X-ray cervical 06/04/2019.   FINDINGS: Alignment: Physiologic.   Vertebrae: No fracture, evidence of discitis, or bone lesion. Marrow edema at the left C1-2 facet. STIR hyperintensity on both sides of the right C6-7 facet is likely subchondral cystic change.   Cord: Normal signal and morphology.   Posterior Fossa, vertebral arteries, paraspinal tissues: Negative for perispinal mass or inflammation   Disc levels:   C1-2: Retro dental ligamentous thickening and atlantal dental degeneration. Left more than right degenerative facet spurring with joint space narrowing and spurring potentially affecting the left C2 nerve root.   C2-3: Degenerative facet spurring asymmetric to the left. No neural impingement   C3-4: Disc narrowing and bulging with bilateral uncovertebral ridging. Advanced bilateral facet spurring and bilateral foraminal impingement. Small bilateral paracentral disc protrusion and ridging without neural compression   C4-5: Disc narrowing with asymmetric leftward bulging and left bulky uncovertebral spurring with underlying protrusion. Asymmetric left facet spurring. Advanced left foraminal impingement   C5-6: Disc narrowing and bulging with uncovertebral and facet spurring asymmetric to the left where there is moderate foraminal stenosis.   C6-7: Disc narrowing and bulging with bilateral uncovertebral spurring and foraminal impingement.   C7-T1:Small bilateral foraminal protrusion. Negative facets.  Mild right foraminal narrowing based on sagittal images.   IMPRESSION: 1. Generalized cervical spine  degeneration with notable active facet arthritis on the left at C1-2. 2. Degenerative foraminal impingement seen on the left at C3-4 to C6-7 and on the right at C3-4 and C6-7. Potential left C2 impingement related to #1. 3. Diffusely patent spinal canal.     Electronically Signed   By: Jorje Guild M.D.   On: 07/27/2021 10:32     Objective:  VS:  HT:    WT:   BMI:     BP:   HR: bpm  TEMP: ( )  RESP:  Physical Exam   Imaging: No results found.

## 2022-01-12 ENCOUNTER — Other Ambulatory Visit: Payer: Self-pay | Admitting: Physician Assistant

## 2022-01-12 NOTE — Telephone Encounter (Signed)
Next Visit: 05/16/2022   Last Visit: 11/09/2021   Last Fill:12/13/2021   Dx: Other insomnia   Current Dose per office note on 11/09/2021: Ambien 5 mg 1 tablet at bedtime for insomnia   Okay to refill Ambien?

## 2022-01-24 ENCOUNTER — Telehealth: Payer: Self-pay | Admitting: Neurology

## 2022-01-24 NOTE — Telephone Encounter (Signed)
-----   Message from [place], DO sent at 12/30/2021  1:43 PM EDT ----- Call patient and find out how did with last botox.  Was her head heavy?  Did it help?  Does she want to continue?

## 2022-01-24 NOTE — Telephone Encounter (Signed)
Called patient and left voicemail.

## 2022-01-24 NOTE — Telephone Encounter (Signed)
Patient does want to continue with injections and does see the difference. She did want me to ask if she could see Clarise Cruz the PA to ask some questions about her hand tremor between now and the next injection date

## 2022-01-25 NOTE — Telephone Encounter (Signed)
Patient stated she understands about the recommendations of Dr. Carles Collet and does not need an appt at this time

## 2022-01-25 NOTE — Telephone Encounter (Signed)
I think she is looking for medication and advice

## 2022-01-25 NOTE — Telephone Encounter (Signed)
She was trying to get into the office as soon as she could and she asked if we had a NP or PA that she could see.

## 2022-02-08 ENCOUNTER — Encounter: Payer: Self-pay | Admitting: Internal Medicine

## 2022-02-09 ENCOUNTER — Other Ambulatory Visit: Payer: Self-pay | Admitting: Rheumatology

## 2022-02-09 NOTE — Telephone Encounter (Signed)
Next Visit: 05/16/2022   Last Visit: 11/09/2021   Last Fill:01/12/2022   Dx: Other insomnia   Current Dose per office note on 11/09/2021: Ambien 5 mg 1 tablet at bedtime for insomnia   Okay to refill Ambien?

## 2022-03-04 ENCOUNTER — Other Ambulatory Visit: Payer: Self-pay | Admitting: Internal Medicine

## 2022-03-04 ENCOUNTER — Other Ambulatory Visit: Payer: Self-pay | Admitting: Orthopaedic Surgery

## 2022-03-08 ENCOUNTER — Ambulatory Visit: Payer: Medicare Other

## 2022-03-13 ENCOUNTER — Other Ambulatory Visit: Payer: Self-pay | Admitting: Rheumatology

## 2022-03-13 NOTE — Telephone Encounter (Signed)
Next Visit: 05/16/2022  Last Visit: 11/09/2021  Last Fill: 02/09/2022  Dx:  Other insomnia   Current Dose per office note on 11/09/2021: Ambien 5 mg 1 tablet at bedtime for insomnia.   Okay to refill Ambien?

## 2022-03-15 ENCOUNTER — Ambulatory Visit (INDEPENDENT_AMBULATORY_CARE_PROVIDER_SITE_OTHER): Payer: Medicare Other

## 2022-03-15 VITALS — BP 120/78 | HR 65 | Temp 98.1°F | Ht 63.0 in | Wt 160.8 lb

## 2022-03-15 DIAGNOSIS — Z1211 Encounter for screening for malignant neoplasm of colon: Secondary | ICD-10-CM | POA: Diagnosis not present

## 2022-03-15 DIAGNOSIS — Z0000 Encounter for general adult medical examination without abnormal findings: Secondary | ICD-10-CM | POA: Diagnosis not present

## 2022-03-15 NOTE — Patient Instructions (Signed)

## 2022-03-15 NOTE — Progress Notes (Signed)
Subjective:   Priscilla [name] is a 71 y.o. female who presents for an Initial Medicare Annual Wellness Visit.    Review of Systems    Defer to PCP Cardiac Risk Factors include: advanced age (>61mn, >>41women);dyslipidemia;diabetes mellitus     Objective:    Today's Vitals   03/15/22 1516  BP: 120/78  Pulse: 65  Temp: 98.1 F (36.7 C)  SpO2: 95%  Weight: 160 lb 12 oz (72.9 kg)  Height: '5\' 3"'$  (1.6 m)   Body mass index is 28.48 kg/m.     03/15/2022    3:03 PM 11/30/2021    9:13 AM 08/25/2021    9:21 AM 06/16/2021    9:33 AM 05/04/2021   10:42 AM 02/23/2021    8:20 AM 01/28/2021    2:07 PM  Advanced Directives  Does Patient Have a Medical Advance Directive? No No No Yes Yes Yes No  Type of ATheatre stage managerof [place] will Living will    Does patient want to make changes to medical advance directive?    No - Patient declined  Yes (MAU/Ambulatory/Procedural Areas - Information given)   Would patient like information on creating a medical advance directive? No - Patient declined      No - Patient declined    Current Medications (verified) Outpatient Encounter Medications as of 03/15/2022  Medication Sig   aspirin 81 MG tablet Take 81 mg by mouth daily.   atorvastatin (LIPITOR) 40 MG tablet Take 1 tablet (40 mg total) by mouth daily.   BD PEN NEEDLE NANO 2ND GEN 32G X 4 MM MISC USE AS DIRECTED THREE TIMES DAILY FOR INSULIN   calcium-vitamin D 250-100 MG-UNIT per tablet Take 1 tablet by mouth daily.    COLLAGEN PO Take by mouth. Adds to tea daily   Continuous Blood Gluc Receiver (FREESTYLE LIBRE 2 READER) DEVI Use as instructed to check blood sugar 4X daily   Continuous Blood Gluc Sensor (FREESTYLE LIBRE 2 SENSOR) MISC Use sensors with CGM to check sugars.   Cyanocobalamin (VITAMIN B12 PO) Place under the tongue.   DULoxetine (CYMBALTA) 60 MG capsule Take 1 capsule (60 mg total) by mouth daily.   fluticasone (FLONASE) 50 MCG/ACT nasal spray  Place 2 sprays into both nostrils in the morning and at bedtime.   glucose blood (ONETOUCH VERIO) test strip USE TO TEST BLOOD SUGAR TWICE A DAY DX: E11.319   insulin detemir (LEVEMIR) 100 UNIT/ML injection Inject 0.13-0.15 mLs (13-15 Units total) into the skin at bedtime.   Insulin Syringe-Needle U-100 (BD INSULIN SYRINGE U/F) 31G X 5/16" 0.5 ML MISC Use 1x a day for insulin   ketoconazole (NIZORAL) 2 % cream Apply to affected area twice daily.   meloxicam (MOBIC) 15 MG tablet Take 15 mg by mouth daily.   meloxicam (MOBIC) 15 MG tablet Take 1 tablet (15 mg total) by mouth daily.   meloxicam (MOBIC) 15 MG tablet TAKE 1 TABLET(15 MG) BY MOUTH DAILY   metFORMIN (GLUCOPHAGE) 1000 MG tablet TAKE 1 TABLET BY MOUTH TWICE DAILY WITH MEALS   metoprolol tartrate (LOPRESSOR) 25 MG tablet TAKE 1 TABLET(25 MG) BY MOUTH TWICE DAILY   NOVOLOG FLEXPEN 100 UNIT/ML FlexPen Inject 2-4 Units into the skin 3 (three) times daily with meals. (Patient taking differently: Inject 2-6 Units into the skin 3 (three) times daily with meals.)   pregabalin (LYRICA) 75 MG capsule Take 1 capsule (75 mg total) by mouth 2 (two) times daily.  sertraline (ZOLOFT) 100 MG tablet Take 1 tablet (100 mg total) by mouth daily.   tiZANidine (ZANAFLEX) 4 MG tablet TAKE 1 TABLET(4 MG) BY MOUTH EVERY 8 HOURS AS NEEDED FOR MUSCLE SPASMS   Vitamin D, Ergocalciferol, (DRISDOL) 1.25 MG (50000 UNIT) CAPS capsule TAKE 1 CAPSULE BY MOUTH EVERY 7 DAYS   zolpidem (AMBIEN) 5 MG tablet TAKE 1 TABLET BY MOUTH AT BEDTIME FOR INSOMNIA   [DISCONTINUED] pravastatin (PRAVACHOL) 40 MG tablet Take 1 tablet (40 mg total) by mouth daily.   No facility-administered encounter medications on file as of 03/15/2022.    Allergies (verified) Patient has no known allergies.   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 Ernestina Patches   PTCA     WRIST SURGERY     LEFT   Family History  Problem Relation Age of Onset   Arthritis Mother    Alzheimer's disease Mother    Stroke Father    Alcohol abuse Father    Diabetes Father    Heart disease Father    Early death Father 77   Diabetes Sister    Arthritis Sister    COPD Sister    Arthritis Maternal Aunt    Diabetes Maternal Aunt    Early death Maternal Aunt 50   Diabetes Maternal Uncle    Cancer Paternal Aunt    COPD Paternal Aunt    Heart disease Paternal Aunt    Heart disease Paternal Uncle    Alcohol abuse Paternal Grandfather    Heart disease Paternal Grandfather    Early death Paternal Grandfather 46   Stroke Paternal Grandfather    Hypertension Son    Autoimmune disease Daughter    Breast cancer Neg Hx    Social History   Socioeconomic History   Marital status: Married    Spouse name: Not on file   Number of children: Not on file   Years of education: Not on file   Highest education level: Not on file  Occupational History   Occupation: retired  Tobacco Use   Smoking status: Never    Passive exposure: Never   Smokeless tobacco: Never  Vaping Use   Vaping Use: Never used  Substance and Sexual Activity   Alcohol use: Yes     Comment: glass of wine-special occasion   Drug use: Never   Sexual activity: Never    Birth control/protection: None  Other Topics Concern   Not on file  Social History Narrative   Married, [name]. 2 children name Abe People and Anderson Malta.   Some college, retired Engineer, manufacturing.   Drinks  caffeine.   Take a daily vitamin.   Wears her seatbelt, exercises 3 times a week.   Smoke detector in the home.   Firearms in the home.   Feels safe in her relationships.   Right handed   Social Determinants of Health   Financial Resource Strain: Low Risk  (03/15/2022)   Overall Financial Resource Strain (CARDIA)    Difficulty of Paying Living Expenses: Not hard at all  Food Insecurity: No Food Insecurity (03/15/2022)   Hunger Vital Sign    Worried About Running Out of Food in the Last Year: Never true    Ran Out of Food in the Last Year: Never true  Transportation Needs: No Transportation Needs (03/15/2022)   PRAPARE - Hydrologist (Medical): No    Lack of Transportation (Non-Medical): No  Physical Activity: Inactive (03/15/2022)   Exercise Vital Sign    Days of Exercise per Week: 0 days    Minutes of Exercise per Session: 0 min  Stress: No Stress Concern Present (03/15/2022)   [place]    Feeling of Stress : Only a little  Social Connections: Moderately Isolated (03/15/2022)   Social Connection and Isolation Panel [NHANES]    Frequency of Communication with Friends and Family: More than three times a week    Frequency of Social Gatherings with Friends and Family: More than three times a week    Attends Religious Services: Never    Marine scientist or Organizations: No    Attends Music therapist: Never    Marital Status: Married    Tobacco Counseling Counseling given: Not Answered   Clinical Intake:  Pre-visit preparation completed: No  Pain : No/denies pain      Nutritional Status: BMI 25 -29 Overweight Nutritional Risks: None Diabetes: Yes  How often do you need to have someone help you when you read instructions, pamphlets, or other written materials from your doctor or pharmacy?: 1 - Never What is the last grade level you completed in school?: 2 year degree  Diabetic?yes  Interpreter Needed?: No      Activities of Daily Living    03/15/2022    3:03 PM  In your present state of health, do you have any difficulty performing the following activities:  Hearing? 0  Vision? 0  Difficulty concentrating or making decisions? 0  Walking or climbing stairs? 0  Dressing or bathing? 0  Doing errands, shopping? 0  Preparing Food and eating ? N  Using the Toilet? N  In the past six months, have you accidently leaked urine? Y  Do you have problems with loss of bowel control? N  Managing your Medications? N  Managing your Finances? N  Housekeeping or managing your Housekeeping? N    Patient Care Team: Ma Hillock, DO as PCP - General (Family Medicine) Marlou Sa Tonna Corner, MD as Consulting Physician (Orthopedic Surgery) Philemon Kingdom, MD as Consulting Physician (Internal Medicine) Mcarthur Rossetti, MD as Consulting Physician (Orthopedic Surgery) Bo Merino, MD as Consulting Physician (Rheumatology) Volanda Napoleon, MD as Consulting Physician (Oncology) Magnus Sinning, MD as Consulting Physician (Physical Medicine and Rehabilitation) Dermatology, [facility], [place], [place] as Consulting Physician (Podiatry) Tat, Eustace Quail, DO as Consulting Physician (Neurology)  Indicate any recent Medical Services you may have received from other than [facility] providers in the past year (date may be approximate).     Assessment:   This is a routine  wellness examination for Priscilla [name].  Hearing/Vision screen No results found.  Dietary issues and exercise activities discussed: Current Exercise Habits: Home exercise routine    Goals Addressed             This Visit's Progress    Become More Active        Notes: drink more water      Depression Screen    03/15/2022    3:02 PM 10/21/2021    9:31 AM 08/15/2021   10:08 AM 02/23/2021    8:18 AM 10/15/2020    2:38 PM 01/14/2020    9:53 AM 09/23/2019    3:33 PM  PHQ 2/9 Scores  PHQ - 2 Score 0 '2 2 1 2 3 3  '$ PHQ- 9 Score  '5   8 5 9  '$ Exception Documentation Medical reason          Fall Risk    03/15/2022    3:03 PM 11/30/2021    9:13 AM 08/25/2021    9:21 AM 05/04/2021   10:42 AM 02/23/2021    8:21 AM  Fall Risk   Falls in the past year? 0 0 0 1 1  Number falls in past yr: 0 0 0 0 1  Injury with Fall? 0 0 0 0 1  Comment     left knee  Risk for fall due to : No Fall Risks    Impaired vision;Impaired balance/gait;Impaired mobility  Follow up Falls evaluation completed    Falls prevention discussed    FALL RISK PREVENTION PERTAINING TO THE HOME:  Any stairs in or around the home? Yes  If so, are there any without handrails? No  Home free of loose throw rugs in walkways, pet beds, electrical cords, etc? Yes  Adequate lighting in your home to reduce risk of falls? Yes   ASSISTIVE DEVICES UTILIZED TO PREVENT FALLS:  Life alert? No  Use of a cane, walker or w/c? No  Grab bars in the bathroom? Yes  Shower chair or bench in shower? Yes  Elevated toilet seat or a handicapped toilet? No   TIMED UP AND GO:  Was the test performed? Yes .  Length of time to ambulate 10 feet: 5 sec.   Gait slow and steady without use of assistive device  Cognitive Function:    12/03/2018    3:25 PM 11/27/2017    9:29 AM  MMSE - Mini Mental State Exam  Orientation to time 5 5  Orientation to Place 5 5  Registration 3 3  Attention/ Calculation 5 5  Recall 3 3  Language- name 2 objects 2 2  Language- repeat 1 1  Language- follow 3 step command 3 3  Language- read & follow direction 1 1  Write a sentence 1 1  Copy design 1 1  Total score 30 30        03/15/2022     3:04 PM 02/23/2021    8:23 AM  6CIT Screen  What Year? 0 points 0 points  What month? 0 points 0 points  What time? 0 points 0 points  Count back from 20 0 points 0 points  Months in reverse 2 points 0 points  Repeat phrase 0 points 0 points  Total Score 2 points 0 points    Immunizations Immunization History  Administered Date(s) Administered   Fluad Quad(high Dose 65+) 12/03/2018, 01/29/2020   Influenza Split 01/21/2013   Influenza, High Dose Seasonal PF 02/04/2015, 02/09/2016, 02/11/2018   Influenza,inj,Quad PF,6+ Mos 01/15/2014  Influenza-Unspecified 01/26/2017   Moderna Sars-Covid-2 Vaccination 05/24/2019, 06/21/2019, 12/22/2019   PPD Test 11/15/2020   Pneumococcal Conjugate-13 02/04/2015   Pneumococcal Polysaccharide-23 10/31/2013, 01/29/2020   Tdap 02/07/2013    TDAP status: Up to date  Flu Vaccine status: Declined, Education has been provided regarding the importance of this vaccine but patient still declined. Advised may receive this vaccine at local pharmacy or Health Dept. Aware to provide a copy of the vaccination record if obtained from local pharmacy or Health Dept. Verbalized acceptance and understanding.  Pneumococcal vaccine status: Up to date  Covid-19 vaccine status: Completed vaccines  Qualifies for Shingles Vaccine? Yes   Zostavax completed No   Shingrix Completed?: No.    Education has been provided regarding the importance of this vaccine. Patient has been advised to call insurance company to determine out of pocket expense if they have not yet received this vaccine. Advised may also receive vaccine at local pharmacy or Health Dept. Verbalized acceptance and understanding.  Screening Tests Health Maintenance  Topic Date Due   COVID-19 Vaccine (4 - 2023-24 season) 12/09/2021   Zoster Vaccines- Shingrix (1 of 2) 06/14/2022 (Originally 08/17/1969)   INFLUENZA VACCINE  07/09/2022 (Originally 11/08/2021)   Fecal DNA (Cologuard)  03/16/2023 (Originally  12/29/2021)   Diabetic kidney evaluation - GFR measurement  05/06/2022   Diabetic kidney evaluation - Urine ACR  05/06/2022   HEMOGLOBIN A1C  06/14/2022   FOOT EXAM  08/10/2022   MAMMOGRAM  11/26/2022   OPHTHALMOLOGY EXAM  12/20/2022   DTaP/Tdap/Td (2 - Td or Tdap) 02/08/2023   Medicare Annual Wellness (AWV)  03/16/2023   DEXA SCAN  11/26/2030   Pneumonia Vaccine 46+ Years old  Completed   Hepatitis C Screening  Completed   HPV VACCINES  Aged Out   COLONOSCOPY (Pts 45-64yr Insurance coverage will need to be confirmed)  Discontinued    Health Maintenance  Health Maintenance Due  Topic Date Due   COVID-19 Vaccine (4 - 2023-24 season) 12/09/2021    Colorectal cancer screening: Type of screening: Cologuard. Completed 12/30/18. Repeat every 3 years  Mammogram status: Completed 11/25/20. Repeat every year  Bone Density status: Completed 11/25/20. Results reflect: Bone density results: NORMAL. Repeat every 2 years.  Lung Cancer Screening: (Low Dose CT Chest recommended if Age 72-80years, 30 pack-year currently smoking OR have quit w/in 15years.) does not qualify.   Lung Cancer Screening Referral: n/a  Additional Screening:  Hepatitis C Screening: does qualify; Completed 08/25/2015  Vision Screening: Recommended annual ophthalmology exams for early detection of glaucoma and other disorders of the eye. Is the patient up to date with their annual eye exam?  No  Who is the provider or what is the name of the office in which the patient attends annual eye exams? N/a If pt is not established with a provider, would they like to be referred to a provider to establish care? No .   Dental Screening: Recommended annual dental exams for proper oral hygiene  Community Resource Referral / Chronic Care Management: CRR required this visit?  No   CCM required this visit?  No      Plan:     I have personally reviewed and noted the following in the patient's chart:   Medical and social  history Use of alcohol, tobacco or illicit drugs  Current medications and supplements including opioid prescriptions. Patient is not currently taking opioid prescriptions. Functional ability and status Nutritional status Physical activity Advanced directives List of other physicians Hospitalizations, surgeries, and  ER visits in previous 12 months Vitals Screenings to include cognitive, depression, and falls Referrals and appointments  In addition, I have reviewed and discussed with patient certain preventive protocols, quality metrics, and best practice recommendations. A written personalized care plan for preventive services as well as general preventive health recommendations were provided to patient.     Beatrix Fetters, [place]   03/15/2022   Nurse Notes: Non-Face to Face or Face to Face 15 minute visit Encounter    Ms. Juliane Lack , Thank you for taking time to come for your Medicare Wellness Visit. I appreciate your ongoing commitment to your health goals. Please review the following plan we discussed and let me know if I can assist you in the future.   These are the goals we discussed:  Goals       Become More Active       Notes: drink more water      Patient Stated      Increase social activities and drink more water.       Patient Stated      Stay active      Patient states.  (pt-stated)      Improve nutrition, mental health (increasing social mtgs with friends) and increasing activity.         This is a list of the screening recommended for you and due dates:  Health Maintenance  Topic Date Due   COVID-19 Vaccine (4 - 2023-24 season) 12/09/2021   Zoster (Shingles) Vaccine (1 of 2) 06/14/2022*   Flu Shot  07/09/2022*   Cologuard (Stool DNA test)  03/16/2023*   Yearly kidney function blood test for diabetes  05/06/2022   Yearly kidney health urinalysis for diabetes  05/06/2022   Hemoglobin A1C  06/14/2022   Complete foot exam   08/10/2022   Mammogram   11/26/2022   Eye exam for diabetics  12/20/2022   DTaP/Tdap/Td vaccine (2 - Td or Tdap) 02/08/2023   Medicare Annual Wellness Visit  03/16/2023   DEXA scan (bone density measurement)  11/26/2030   Pneumonia Vaccine  Completed   Hepatitis C Screening: USPSTF Recommendation to screen - Ages 2-79 yo.  Completed   HPV Vaccine  Aged Out   Colon Cancer Screening  Discontinued  *Topic was postponed. The date shown is not the original due date.

## 2022-03-24 ENCOUNTER — Ambulatory Visit (INDEPENDENT_AMBULATORY_CARE_PROVIDER_SITE_OTHER): Payer: Medicare Other

## 2022-03-24 DIAGNOSIS — Z23 Encounter for immunization: Secondary | ICD-10-CM

## 2022-03-28 ENCOUNTER — Other Ambulatory Visit: Payer: Self-pay | Admitting: Hematology & Oncology

## 2022-03-28 DIAGNOSIS — E559 Vitamin D deficiency, unspecified: Secondary | ICD-10-CM

## 2022-03-30 ENCOUNTER — Ambulatory Visit: Payer: Medicare Other | Admitting: Podiatry

## 2022-04-06 ENCOUNTER — Encounter: Payer: Self-pay | Admitting: Family Medicine

## 2022-04-07 ENCOUNTER — Ambulatory Visit: Payer: Medicare Other | Admitting: Family Medicine

## 2022-04-10 ENCOUNTER — Other Ambulatory Visit: Payer: Self-pay | Admitting: Physician Assistant

## 2022-04-10 HISTORY — PX: COLONOSCOPY: SHX174

## 2022-04-11 NOTE — Telephone Encounter (Signed)
Next Visit: 05/16/2022   Last Visit: 11/09/2021   Last Fill: 03/14/2022   Dx:  Other insomnia    Current Dose per office note on 11/09/2021: Ambien 5 mg 1 tablet at bedtime for insomnia.    Okay to refill Ambien?

## 2022-04-13 ENCOUNTER — Other Ambulatory Visit: Payer: Self-pay

## 2022-04-13 LAB — COLOGUARD

## 2022-04-14 ENCOUNTER — Ambulatory Visit (INDEPENDENT_AMBULATORY_CARE_PROVIDER_SITE_OTHER): Payer: PPO | Admitting: Neurology

## 2022-04-14 DIAGNOSIS — G243 Spasmodic torticollis: Secondary | ICD-10-CM | POA: Diagnosis not present

## 2022-04-14 MED ORDER — ONABOTULINUMTOXINA 100 UNITS IJ SOLR
200.0000 [IU] | Freq: Once | INTRAMUSCULAR | Status: AC
  Administered 2022-04-14: 200 [IU] via INTRAMUSCULAR

## 2022-04-14 NOTE — Procedures (Signed)
[facility]   Procedure Note Botox  Attending: Dr. Wells Guiles Tylen Leverich  Preoperative Diagnosis(es): Cervical Dystonia  Result History  Did well but still with significant neck pain (has degen changes)  Consent obtained from: The patient Benefits discussed included, but were not limited to decreased muscle tightness, increased joint range of motion, and decreased pain.  Risk discussed included, but were not limited pain and discomfort, bleeding, bruising, excessive weakness, venous thrombosis, muscle atrophy and dysphagia.  A copy of the patient medication guide was given to the patient which explains the blackbox warning.  Patients identity and treatment sites confirmed Yes.  .  Details of Procedure: Skin was cleaned with alcohol.  A 30 gauge, 60m  needle was introduced to the target muscle, except for posterior splenius where 27 gauge, 1.5 inch needle used.   Prior to injection, the needle plunger was aspirated to make sure the needle was not within a blood vessel.  There was no blood retrieved on aspiration.    Following is a summary of the muscles injected  And the amount of Botulinum toxin used:   Dilution 0.9% preservative free saline mixed with 100 u Botox type A to make 10 U per 0.1cc  Injections  Location Left  Right Units Number of sites        Sternocleidomastoid 60  60 1  Splenius Capitus, posterior approach  70 70 1  Splenius Capitus, lateral approach  '30 30 1  '$ Levator Scapulae      Trapezius 10/10 10/10 40 2 each side        TOTAL UNITS:   200    Agent: Botulinum Type A ( Onobotulinum Toxin type A ).  2 vials of Botox were used, each containing 100 units and freshly diluted with 1 mL of sterile, non-preserved saline   Total injected (Units): 200  Total wasted (Units): none wasted   Pt tolerated procedure well without complications.   Reinjection is anticipated in 3 months.

## 2022-04-15 ENCOUNTER — Encounter: Payer: Self-pay | Admitting: Hematology & Oncology

## 2022-04-18 ENCOUNTER — Encounter: Payer: Self-pay | Admitting: Internal Medicine

## 2022-04-18 ENCOUNTER — Ambulatory Visit (INDEPENDENT_AMBULATORY_CARE_PROVIDER_SITE_OTHER): Payer: PPO | Admitting: Internal Medicine

## 2022-04-18 ENCOUNTER — Encounter: Payer: Self-pay | Admitting: Hematology & Oncology

## 2022-04-18 VITALS — BP 120/88 | HR 73 | Ht 63.0 in | Wt 160.2 lb

## 2022-04-18 DIAGNOSIS — E785 Hyperlipidemia, unspecified: Secondary | ICD-10-CM

## 2022-04-18 DIAGNOSIS — E1142 Type 2 diabetes mellitus with diabetic polyneuropathy: Secondary | ICD-10-CM

## 2022-04-18 DIAGNOSIS — Z794 Long term (current) use of insulin: Secondary | ICD-10-CM

## 2022-04-18 DIAGNOSIS — E11319 Type 2 diabetes mellitus with unspecified diabetic retinopathy without macular edema: Secondary | ICD-10-CM

## 2022-04-18 LAB — COMPREHENSIVE METABOLIC PANEL
ALT: 13 U/L (ref 0–35)
AST: 14 U/L (ref 0–37)
Albumin: 4.2 g/dL (ref 3.5–5.2)
Alkaline Phosphatase: 51 U/L (ref 39–117)
BUN: 25 mg/dL — ABNORMAL HIGH (ref 6–23)
CO2: 28 mEq/L (ref 19–32)
Calcium: 9.4 mg/dL (ref 8.4–10.5)
Chloride: 101 mEq/L (ref 96–112)
Creatinine, Ser: 0.79 mg/dL (ref 0.40–1.20)
GFR: 74.6 mL/min (ref >60.00)
Glucose, Bld: 133 mg/dL — ABNORMAL HIGH (ref 70–99)
Potassium: 4.6 mEq/L (ref 3.5–5.1)
Sodium: 140 mEq/L (ref 135–145)
Total Bilirubin: 0.4 mg/dL (ref 0.2–1.2)
Total Protein: 6.8 g/dL (ref 6.0–8.3)

## 2022-04-18 LAB — MICROALBUMIN / CREATININE URINE RATIO
Creatinine,U: 77.2 mg/dL
Microalb Creat Ratio: 1 mg/g (ref 0.0–30.0)
Microalb, Ur: 0.8 mg/dL (ref 0.0–1.9)

## 2022-04-18 LAB — LIPID PANEL
Cholesterol: 142 mg/dL (ref 0–200)
HDL: 64.7 mg/dL (ref >39.00)
LDL Cholesterol: 64 mg/dL (ref 0–99)
NonHDL: 77.56
Total CHOL/HDL Ratio: 2
Triglycerides: 67 mg/dL (ref 0.0–149.0)
VLDL: 13.4 mg/dL (ref 0.0–40.0)

## 2022-04-18 LAB — POCT GLYCOSYLATED HEMOGLOBIN (HGB A1C): Hemoglobin A1C: 7 % — AB (ref 4.0–5.6)

## 2022-04-18 NOTE — Patient Instructions (Signed)
Please continue: - Metformin 1000 mg 2x a day with meals - Levemir 15 units daily in am - Novolog 4-6 units 15 minutes before meals, but 7-8 units before a large meal or if eating out  Please come back for a follow-up appointment in 4 months.

## 2022-04-18 NOTE — Progress Notes (Unsigned)
Patient ID: Priscilla [name], female   DOB: March 21, 1951, 72 y.o.   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 She previously saw Drs Dwyane Dee (distant past) and [place] (more recently). Last visit with me 4 months ago.  Interim history: She continues to have a lot of stress at home due to her husband who has vascular dementia.  He had back surgery. She has urinary incontinence, no blurry vision, chest pain.  Her tremors are worse.  She has cervical dystonia.  Reviewed HbA1c levels: Lab Results  Component Value Date   HGBA1C 6.9 (A) 12/14/2021   HGBA1C 7.0 (A) 08/09/2021   HGBA1C 6.6 (A) 03/07/2021   HGBA1C 6.7 (A) 10/29/2020   HGBA1C 6.2 (A) 06/07/2020   HGBA1C 6.3 (A) 12/30/2019   HGBA1C 6.3 (A) 08/25/2019   HGBA1C 5.9 (A) 04/24/2019   HGBA1C 6.0 (A) 02/13/2019   HGBA1C 6.1 (A) 09/20/2018   HGBA1C 6.4 (A) 05/23/2018   HGBA1C 6.0 (A) 10/17/2017   HGBA1C 6.3 06/13/2017   HGBA1C 5.9 11/24/2016   HGBA1C 6.5 (H) 04/22/2016  03/16/2017: HbA1c calculated from fructosamine: 5.6% 10/18/2015: HbA1c calculated from fructosamine: 5.7% 02/2015: HbA1c 7.4%, HbA1c calculated from fructosamine: 5.6%  She is on: - Metformin 1000 mg 2x a day with meals - Levemir 30 >> .Marland Kitchen. 15 >> 13 >> 10-13 >> 13-15 >> 15 units at bedtime >> in am - Mealtime Novolog:  (3) 4-5 >> 4-6 before meals (mostly uses 4 units) If sugars before the meal are 60 or lower, please do not take the Novolog dose. If sugars before the meal are 61-80, take only half of the Novolog dose. She was on Victoza 2.5 mg daily >> stopped as she could not afford this.  Pt.checks her sugars more than 4 times a day with her CGM (with receiver)  Previously:   Prev.:   Lowest sugar was 42 >> ... 59 >> 70s; she has hypoglycemia awareness in the in the 70s. Highest sugar was300 (steroids) >>  258 >> 250  >> 280 (steroids).  Glucometer: Freestyle  -No CKD; last BUN/creatinine:  Lab Results  Component Value Date   BUN 26 (H) 05/06/2021   CREATININE 0.76 05/06/2021  ACR (12/2014): 6.9 On lisinopril 1.25 mg daily  -+ HL; last set of lipids: Lab Results  Component Value Date   CHOL 215 (H) 05/06/2021   HDL 60.70 05/06/2021   LDLCALC 131 (H) 05/06/2021   TRIG 114.0 05/06/2021   CHOLHDL 4 05/06/2021  On pravastatin 40.  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  She has floaters.  -+ numbness, tingling, burning in her feet - stable.  On B12.  I recommended alpha lipoic acid but she did not take this due to the large size of the pill.  Previously on Neurontin 100 mg daily per podiatry, now seldom, 2/2 being also on Ambien.  Last foot exam 08/09/2021.  Latest TSH normal: Lab Results  Component Value Date   TSH 2.00 05/06/2021   She has a history of lap band surgery >> she does not usually eat large meals but she usually grazes, especially at night She has neck pain and dizziness and was diagnosed with possible cervical dystonia.  She sees neurology. She has hip pain >> had steroid inj's.  ROS: + See  HPI  I reviewed pt's medications, allergies, PMH, social hx, family hx, and changes were documented in the history of present illness. Otherwise, unchanged from my initial visit note.  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 Ernestina Patches   PTCA     WRIST SURGERY     LEFT   Social History   Social History   Marital Status: Married    Spouse Name: N/A   Number of Children: N/A   Occupational History   Not on file.   Social History Main Topics   Smoking status: Never Smoker    Smokeless tobacco: Never Used   Alcohol Use: Yes     Comment: glass of wine-specially occasion   Drug Use: No   Current Outpatient Medications on File Prior to Visit  Medication Sig Dispense Refill   aspirin 81 MG tablet Take 81 mg by mouth daily.     atorvastatin (LIPITOR) 40 MG tablet Take 1 tablet (40 mg total) by mouth daily. 90 tablet 3   BD PEN NEEDLE NANO 2ND GEN 32G X 4 MM MISC USE AS DIRECTED THREE TIMES DAILY FOR INSULIN 300 each 3   calcium-vitamin D 250-100 MG-UNIT per tablet Take 1 tablet by mouth daily.      COLLAGEN PO Take by mouth. Adds to tea daily     Continuous Blood Gluc Receiver (FREESTYLE LIBRE 2 READER) DEVI Use as instructed to check blood sugar 4X daily 1 each 0   Continuous Blood Gluc Sensor (FREESTYLE LIBRE 2 SENSOR) MISC Use sensors with CGM to check sugars. 6 each 3   Cyanocobalamin (VITAMIN B12 PO) Place under the tongue.     DULoxetine (CYMBALTA) 60 MG capsule Take 1 capsule (60 mg total) by mouth daily. 30 capsule 5   fluticasone (FLONASE) 50 MCG/ACT nasal spray Place 2 sprays into both nostrils in the morning and at bedtime. 16 g 6   glucose blood (ONETOUCH VERIO) test strip USE TO TEST BLOOD SUGAR TWICE A DAY DX: E11.319 150 each 3   insulin detemir (LEVEMIR) 100 UNIT/ML injection Inject 0.13-0.15 mLs (13-15 Units total) into  the skin at bedtime. 30 mL 3   Insulin Syringe-Needle U-100 (BD INSULIN SYRINGE U/F) 31G X 5/16" 0.5 ML MISC Use 1x a day for insulin 100 each 3   ketoconazole (NIZORAL) 2 % cream Apply to affected area twice daily. 60 g 3   meloxicam (MOBIC) 15 MG tablet Take 15 mg by mouth daily.     meloxicam (MOBIC) 15 MG tablet Take 1 tablet (15 mg total) by mouth daily. 30 tablet 0  meloxicam (MOBIC) 15 MG tablet TAKE 1 TABLET(15 MG) BY MOUTH DAILY 90 tablet 3   metFORMIN (GLUCOPHAGE) 1000 MG tablet TAKE 1 TABLET BY MOUTH TWICE DAILY WITH MEALS 180 tablet 3   metoprolol tartrate (LOPRESSOR) 25 MG tablet TAKE 1 TABLET(25 MG) BY MOUTH TWICE DAILY 60 tablet 5   NOVOLOG FLEXPEN 100 UNIT/ML FlexPen Inject 2-4 Units into the skin 3 (three) times daily with meals. (Patient taking differently: Inject 2-6 Units into the skin 3 (three) times daily with meals.) 15 mL 2   pregabalin (LYRICA) 75 MG capsule Take 1 capsule (75 mg total) by mouth 2 (two) times daily. 60 capsule 5   sertraline (ZOLOFT) 100 MG tablet Take 1 tablet (100 mg total) by mouth daily. 30 tablet 5   tiZANidine (ZANAFLEX) 4 MG tablet TAKE 1 TABLET(4 MG) BY MOUTH EVERY 8 HOURS AS NEEDED FOR MUSCLE SPASMS 90 tablet 5   Vitamin D, Ergocalciferol, (DRISDOL) 1.25 MG (50000 UNIT) CAPS capsule TAKE 1 CAPSULE BY MOUTH EVERY 7 DAYS 4 capsule 6   zolpidem (AMBIEN) 5 MG tablet TAKE 1 TABLET BY MOUTH AT BEDTIME FOR INSOMNIA 30 tablet 0   No current facility-administered medications on file prior to visit.   No Known Allergies Family History  Problem Relation Age of Onset   Arthritis Mother    Alzheimer's disease Mother    Stroke Father    Alcohol abuse Father    Diabetes Father    Heart disease Father    Early death Father 7   Diabetes Sister    Arthritis Sister    COPD Sister    Arthritis Maternal Aunt    Diabetes Maternal Aunt    Early death Maternal Aunt 50   Diabetes Maternal Uncle    Cancer Paternal Aunt    COPD Paternal Aunt    Heart  disease Paternal Aunt    Heart disease Paternal Uncle    Alcohol abuse Paternal Grandfather    Heart disease Paternal Grandfather    Early death Paternal Grandfather 51   Stroke Paternal Grandfather    Hypertension Son    Autoimmune disease Daughter    Breast cancer Neg Hx    PE: BP 120/88 (BP Location: Left Arm, Patient Position: Sitting, Cuff Size: Normal)   Pulse 73   Ht '5\' 3"'$  (1.6 m)   Wt 160 lb 3.2 oz (72.7 kg)   LMP  (LMP Unknown)   SpO2 97%   BMI 28.38 kg/m   Wt Readings from Last 3 Encounters:  04/18/22 160 lb 3.2 oz (72.7 kg)  03/15/22 160 lb 12 oz (72.9 kg)  12/14/21 157 lb 3.2 oz (71.3 kg)   Constitutional: Normal weight, in NAD Eyes: EOMI, no exophthalmos ENT: no thyromegaly, no cervical lymphadenopathy Cardiovascular: RRR, No MRG Respiratory: CTA B Musculoskeletal: no deformities Skin: no rashes Neurological: + tremor with outstretched hands  ASSESSMENT: 1. DM2, insulin-dependent, uncontrolled, with complications - Diabetic retinopathy left eye - resolved - Peripheral neuropathy  Component     Latest Ref Rng & Units 04/24/2019  Hemoglobin A1C     4.0 - 5.6 % 5.9 (A)  Islet Cell Ab     Neg:<1:1 Negative  ZNT8 Antibodies     U/mL <15  C-Peptide     0.80 - 3.85 ng/mL 2.35  Glucose, Plasma     65 - 99 mg/dL 130 (H)  Glutamic Acid Decarb Ab     <5 IU/mL <5  No insulin deficiency or pancreatic autoimmunity.  2. HL  3.  PN   PLAN:  1. Patient with longstanding, fairly well-controlled type 2 diabetes, on metformin and basal-bolus insulin regimen with increased control in the last few years, especially after her gastric bypass surgery.  At last visit, HbA1c was slightly lower, 6.9%.  Sugars were dropping sometimes lower than 70, and they were increasing slightly after breakfast and lunch but more significantly after dinner.  I suggested to move the Levemir to the morning. CGM interpretation: -At today's visit, we reviewed her CGM downloads: It appears  that 91% of values are in target range (goal >70%), while 9% are higher than 180 (goal <25%), and 0% are lower than 70 (goal <4%).  The calculated average blood sugar is 130.  The projected HbA1c for the next 3 months (GMI) is 6.4%. -Reviewing the CGM trends, sugars are mostly fluctuating within the target range, slightly higher after lunch and dinner, but usually with values lower than 180.  As of now, we discussed about continuing the same dose of Levemir, taken in the morning, which appears to work better for her but to vary the dose of NovoLog based on the size and consistency of her meals.  I did advise her to increase the dose of NovoLog before larger meals. - I advised her to:  Patient Instructions  Please continue: - Metformin 1000 mg 2x a day with meals - Levemir 15 units daily in am - Novolog 4-6 units 15 minutes before meals, but 7-8 units before a large meal or if eating out  Please come back for a follow-up appointment in 4 months.  - we checked her HbA1c: 7.0% (sl. Higher) - advised to check sugars at different times of the day - 4x a day, rotating check times - advised for yearly eye exams >> she is UTD -Will check annual labs today - return to clinic in 4 months   2. HL -Reviewed latest lipid panel: LDL above target, otherwise fractions at goal: Lab Results  Component Value Date   CHOL 215 (H) 05/06/2021   HDL 60.70 05/06/2021   LDLCALC 131 (H) 05/06/2021   TRIG 114.0 05/06/2021   CHOLHDL 4 05/06/2021  -She continues on pravastatin 40 mg daily without side effects.   -Will check a lipid panel today  3. PN -Due to diabetes -Stable -I suggested 5-week as in the past but she could not take it due to large tablet size -She is on B12 vitamin, Cymbalta, and occasional gabapentin-per podiatry.  These are helping.  Philemon Kingdom, MD PhD [facility]

## 2022-04-26 ENCOUNTER — Other Ambulatory Visit: Payer: Self-pay

## 2022-04-26 DIAGNOSIS — E11319 Type 2 diabetes mellitus with unspecified diabetic retinopathy without macular edema: Secondary | ICD-10-CM

## 2022-04-26 MED ORDER — FREESTYLE LIBRE 2 SENSOR MISC: 3 refills | Status: DC

## 2022-04-26 NOTE — Addendum Note (Signed)
Addended by: Lauralyn Primes on: 04/26/2022 08:33 AM   Modules accepted: Orders

## 2022-05-02 ENCOUNTER — Other Ambulatory Visit: Payer: Self-pay

## 2022-05-02 MED ORDER — METOPROLOL TARTRATE 25 MG PO TABS: ORAL_TABLET | ORAL | 0 refills | Status: DC

## 2022-05-02 NOTE — Progress Notes (Signed)
Office Visit Note  Patient: Priscilla [name]             Date of Birth: 1951/01/16           MRN: [id number]             PCP: Ma Hillock, DO Referring: Ma Hillock, DO Visit Date: 05/16/2022 Occupation: '@GUAROCC'$ @  Subjective:  Follow-up (Lots of pain, stiffness with neck)   History of Present Illness: Priscilla [name] is a 72 y.o. female with history of psoriatic arthritis, sore rices, osteoarthritis and degenerative disc disease.  She has not had any flare of psoriatic arthritis for a long time.  She denies any psoriasis flare.  She uses shampoo for occasional dryness in her scalp.  There is no history of Achilles tendinitis of Planter fasciitis.  She states she experiences constant pain and discomfort in her cervical spine.  She was initially evaluated by orthopedics and then was referred to neurology.  She has been under care of Dr. Guillermina City who diagnosed her with cervical dystonia.  She states that she had physical therapy last year which made her cervical spine pain worse.   She has had Botox injection by Dr. Carles Collet which were not helpful.  She tried dry needling without much results.  She is in constant discomfort despite taking medications.  There is no history of joint swelling.  Patient states that she also an ENT physician for cervical lymph node enlargement which was benign.    Activities of Daily Living:  Patient reports morning stiffness for 1 hour.   Patient Reports nocturnal pain.  Difficulty dressing/grooming: Denies Difficulty climbing stairs: Denies Difficulty getting out of chair: Denies Difficulty using hands for taps, buttons, cutlery, and/or writing: Reports  Review of Systems  Constitutional:  Positive for fatigue.  HENT:  Positive for mouth dryness. Negative for mouth sores.   Eyes:  Positive for dryness.  Respiratory:  Negative for difficulty breathing.   Cardiovascular: Negative.  Negative for chest pain and palpitations.  Gastrointestinal: Negative.   Negative for blood in stool, constipation and diarrhea.  Endocrine: Negative.  Negative for increased urination.  Genitourinary:  Positive for involuntary urination.  Musculoskeletal:  Positive for joint pain, gait problem, joint pain, myalgias, muscle weakness, morning stiffness and myalgias. Negative for joint swelling and muscle tenderness.  Skin: Negative.  Negative for color change, rash, hair loss and sensitivity to sunlight.  Allergic/Immunologic: Negative.  Negative for susceptible to infections.  Neurological:  Positive for dizziness and headaches.  Hematological:  Positive for swollen glands.  Psychiatric/Behavioral:  Positive for depressed mood and sleep disturbance. The patient is nervous/anxious.     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   Gastric bypass status for obesity 08/31/2016   Iron deficiency anemia 04/28/2016   Other insomnia 03/14/2016   DDD (degenerative disc disease), cervical 03/14/2016   Spondylosis without myelopathy or radiculopathy, lumbar region 01/31/2016   Anxiety and depression 06/25/2014   Psoriatic arthritis - sees rheumatologist 02/20/2014   Asthma 12/14/2008   Diabetic peripheral neuropathy associated with type 2 diabetes mellitus ([place]) 08/06/2008   Hyperlipemia 11/12/2006   Essential hypertension 11/12/2006    Past Medical History:  Diagnosis Date   Anemia  12/14/2008   Anxiety and depression 12/14/2008   Asthma 11/12/2006   CAD (coronary artery disease) 12/14/2008   hx transluminal coronary angioplasty   Cataract    Cervical dystonia    Depression    Diabetes mellitus with neuropathy ([place]) 12/14/2008   Fibromyalgia 01/04/2007   sees rheuamtology   Gastric bypass  status for obesity 08/31/2016   Hyperlipemia 01/21/2010   Hypertension 12/14/2008   Leg edema 12/14/2008   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   OSA (obstructive sleep apnea) 08/06/2008   Osteoarthritis 12/14/2008   Palpitations 11/12/2006   hx sinus tachy   Psoriatic arthritis [facility])    sees rheumatology   Rotator cuff injury     Family History  Problem Relation Age of Onset   Arthritis Mother    Alzheimer's disease Mother    Stroke Father    Alcohol abuse Father    Diabetes Father    Heart disease Father    Early death Father 67   Diabetes Sister    Arthritis Sister    COPD Sister    Arthritis Maternal Aunt    Diabetes Maternal Aunt    Early death Maternal Aunt 50   Diabetes Maternal Uncle    Cancer Paternal Aunt    COPD Paternal Aunt    Heart disease Paternal Aunt    Heart disease Paternal Uncle    Alcohol abuse Paternal Grandfather    Heart disease Paternal Grandfather    Early death Paternal Grandfather 20   Stroke Paternal Grandfather    Hypertension Son    Autoimmune disease Daughter    Breast cancer Neg Hx    Past Surgical History:  Procedure Laterality Date   ABDOMINAL HYSTERECTOMY     CHOLECYSTECTOMY     LAPAROSCOPIC GASTRIC BANDING     Lumbar Facet Joint Intra-Articular Injection(s) with Fluoroscopic Guidance  09/13/2020   L4-5; Dr. Ernestina Patches   PTCA     WRIST SURGERY     LEFT   Social History   Social History Narrative   Married, [name]. 2 children name Abe People and Anderson Malta.   Some college, retired Engineer, manufacturing.   Drinks caffeine.   Take a daily vitamin.   Wears her seatbelt, exercises 3 times a week.   Smoke detector in the home.   Firearms in the home.   Feels safe in her relationships.   Right handed   Immunization History  Administered Date(s) Administered   Fluad Quad(high Dose 65+) 12/03/2018, 01/29/2020, 03/24/2022   Influenza Split 01/21/2013   Influenza, High Dose Seasonal PF 02/04/2015, 02/09/2016, 02/11/2018    Influenza,inj,Quad PF,6+ Mos 01/15/2014   Influenza-Unspecified 01/26/2017   Moderna Sars-Covid-2 Vaccination 05/24/2019, 06/21/2019, 12/22/2019   PPD Test 11/15/2020   Pneumococcal Conjugate-13 02/04/2015   Pneumococcal Polysaccharide-23 10/31/2013, 01/29/2020   Tdap 02/07/2013     Objective: Vital Signs: BP 136/81 (BP Location: Left Arm, Patient Position: Sitting, Cuff Size: Normal)   Pulse 67   Ht '5\' 3"'$  (1.6 m)   Wt 160 lb (72.6 kg)   LMP  (LMP Unknown)   BMI 28.34 kg/m    Physical Exam Vitals and nursing note reviewed.  Constitutional:      Appearance: She is well-developed.  HENT:     Head: Normocephalic and atraumatic.  Eyes:     Conjunctiva/sclera: Conjunctivae normal.  Cardiovascular:     Rate and Rhythm: Normal rate and regular rhythm.     Heart sounds: Normal heart sounds.  Pulmonary:     Effort: Pulmonary effort is normal.  Breath sounds: Normal breath sounds.  Abdominal:     General: Bowel sounds are normal.     Palpations: Abdomen is soft.  Musculoskeletal:     Cervical back: Normal range of motion.  Lymphadenopathy:     Cervical: No cervical adenopathy.  Skin:    General: Skin is warm and dry.     Capillary Refill: Capillary refill takes less than 2 seconds.  Neurological:     Mental Status: She is alert and oriented to person, place, and time.  Psychiatric:        Behavior: Behavior normal.      Musculoskeletal Exam: She had limited lateral rotation of the cervical spine with lumbar discomfort.  She had limited flexion and extension of the cervical spine.  She had limited range of motion of the lumbar spine.  There was no tenderness over thoracic or lumbar spine.  Shoulder joints are in good range of motion.  Elbow joints and wrist joints with good range of motion.  She had PIP and DIP thickening with no synovitis.  She had limited range of motion of her right hip joint.  Left hip joint was in good range of motion.  Knee joints were in good range of  motion.  There was no tenderness over ankles or MTPs.  CDAI Exam: CDAI Score: -- Patient Global: --; Provider Global: -- Swollen: --; Tender: -- Joint Exam 05/16/2022   No joint exam has been documented for this visit   There is currently no information documented on the homunculus. Go to the Rheumatology activity and complete the homunculus joint exam.  Investigation: No additional findings.  Imaging: No results found.  Recent Labs: Lab Results  Component Value Date   WBC 6.7 05/06/2021   HGB 13.5 05/06/2021   PLT 298.0 05/06/2021   NA 140 04/18/2022   K 4.6 04/18/2022   CL 101 04/18/2022   CO2 28 04/18/2022   GLUCOSE 133 (H) 04/18/2022   BUN 25 (H) 04/18/2022   CREATININE 0.79 04/18/2022   BILITOT 0.4 04/18/2022   ALKPHOS 51 04/18/2022   AST 14 04/18/2022   ALT 13 04/18/2022   PROT 6.8 04/18/2022   ALBUMIN 4.2 04/18/2022   CALCIUM 9.4 04/18/2022   GFRAA >60 11/19/2019    Speciality Comments: No specialty comments available.  Procedures:  No procedures performed Allergies: Patient has no known allergies.   Assessment / Plan:     Visit Diagnoses: Psoriatic arthritis (HCC)-patient had no synovitis on the examination.  She denies any history of joint swelling.  There is no history of dactylitis, Planter fasciitis or Achilles tendinitis.  Psoriasis-she had no flares of psoriasis.  She uses shampoo for occasional dryness in her scalp.  Primary osteoarthritis of both hands-she has severe arthritis in her hands with DIP and PIP thickening.  She denies any discomfort today.  Joint protection was discussed.  Trochanteric bursitis, right hip -she has some discomfort with range of motion.  Right trochanteric bursa cortisone injection on 03/13/2021.  She had a right hip injection performed by Dr. Ernestina Patches on 06/06/2021 which alleviated her discomfort.  Pain in both feet-denies discomfort today.  Dystonia - Followed by Dr. Carles Collet.  DDD (degenerative disc disease), cervical  -she has been having severe pain and discomfort in her cervical spine without radiculopathy.  She has tried physical therapy, dry needling, and Botox injections.  Per her request I will make a referral to spinal scoliosis center.  MRI of the C-spine obtained on 07/27/2021 revealed generalized cervical spine  degeneration with notable active facet arthritis at left C1-C2.  DDD (degenerative disc disease), lumbar -she continues to have some lower back discomfort which is manageable currently.  She is followed by Dr. Ernestina Patches.  She had a facet injection on 09/13/20, which provided significant pain relief.  History of scoliosis  Fibromyalgia -she has generalized pain and discomfort from fibromyalgia.  She takes cymbalta 60 mg daily, tizanidine 4 mg every 8 hours as needed for muscle spasms and remains on Lyrica 75 mg twice daily.  Other fatigue  Osteoporosis screening - DEXA updated on 11/25/20:The BMD measured at Forearm Radius 33% is 0.842 g/cm2 with a T-score of -0.5.  Other insomnia -she is on Ambien 5 mg 1 tablet at bedtime for insomnia.  She is unable to sleep without Ambien.  Good sleep hygiene was discussed.  Other medical problems are listed as follows:  Gastric bypass status for obesity  History of diabetes mellitus  History of asthma  History of vitamin D deficiency  Coarse tremors - cervical dystonia, followed by Dr. Carles Collet.  History of sleep apnea  Anxiety and depression  Gait instability  Orders: Orders Placed This Encounter  Procedures   Ambulatory referral to Spine Surgery   No orders of the defined types were placed in this encounter.    Follow-Up Instructions: Return in about 6 months (around 11/14/2022) for Psoriatic arthritis, Osteoarthritis.   Bo Merino, MD  Note - This record has been created using Editor, commissioning.  Chart creation errors have been sought, but may not always  have been located. Such creation errors do not reflect on  the standard of  medical care.

## 2022-05-04 ENCOUNTER — Other Ambulatory Visit: Payer: Self-pay

## 2022-05-05 ENCOUNTER — Other Ambulatory Visit: Payer: Self-pay

## 2022-05-08 ENCOUNTER — Other Ambulatory Visit: Payer: Self-pay | Admitting: Family Medicine

## 2022-05-08 DIAGNOSIS — Z1231 Encounter for screening mammogram for malignant neoplasm of breast: Secondary | ICD-10-CM

## 2022-05-12 ENCOUNTER — Other Ambulatory Visit: Payer: Self-pay | Admitting: Rheumatology

## 2022-05-12 NOTE — Telephone Encounter (Signed)
Next Visit: 05/16/2022   Last Visit: 11/09/2021   Last Fill: 04/11/2022   Dx:  Other insomnia    Current Dose per office note on 11/09/2021: Ambien 5 mg 1 tablet at bedtime for insomnia.    Okay to refill Ambien?

## 2022-05-16 ENCOUNTER — Ambulatory Visit: Payer: PPO | Attending: Rheumatology | Admitting: Rheumatology

## 2022-05-16 ENCOUNTER — Other Ambulatory Visit: Payer: Self-pay

## 2022-05-16 ENCOUNTER — Encounter: Payer: Self-pay | Admitting: Rheumatology

## 2022-05-16 VITALS — BP 136/81 | HR 67 | Ht 63.0 in | Wt 160.0 lb

## 2022-05-16 DIAGNOSIS — M5030 Other cervical disc degeneration, unspecified cervical region: Secondary | ICD-10-CM

## 2022-05-16 DIAGNOSIS — M79671 Pain in right foot: Secondary | ICD-10-CM | POA: Diagnosis not present

## 2022-05-16 DIAGNOSIS — F419 Anxiety disorder, unspecified: Secondary | ICD-10-CM

## 2022-05-16 DIAGNOSIS — Z8639 Personal history of other endocrine, nutritional and metabolic disease: Secondary | ICD-10-CM

## 2022-05-16 DIAGNOSIS — Z13820 Encounter for screening for osteoporosis: Secondary | ICD-10-CM | POA: Diagnosis not present

## 2022-05-16 DIAGNOSIS — R2681 Unsteadiness on feet: Secondary | ICD-10-CM

## 2022-05-16 DIAGNOSIS — M19041 Primary osteoarthritis, right hand: Secondary | ICD-10-CM | POA: Diagnosis not present

## 2022-05-16 DIAGNOSIS — M7061 Trochanteric bursitis, right hip: Secondary | ICD-10-CM

## 2022-05-16 DIAGNOSIS — G249 Dystonia, unspecified: Secondary | ICD-10-CM

## 2022-05-16 DIAGNOSIS — R5383 Other fatigue: Secondary | ICD-10-CM

## 2022-05-16 DIAGNOSIS — Z8709 Personal history of other diseases of the respiratory system: Secondary | ICD-10-CM

## 2022-05-16 DIAGNOSIS — G252 Other specified forms of tremor: Secondary | ICD-10-CM

## 2022-05-16 DIAGNOSIS — Z8669 Personal history of other diseases of the nervous system and sense organs: Secondary | ICD-10-CM

## 2022-05-16 DIAGNOSIS — F32A Depression, unspecified: Secondary | ICD-10-CM

## 2022-05-16 DIAGNOSIS — M5136 Other intervertebral disc degeneration, lumbar region: Secondary | ICD-10-CM

## 2022-05-16 DIAGNOSIS — M797 Fibromyalgia: Secondary | ICD-10-CM

## 2022-05-16 DIAGNOSIS — L4050 Arthropathic psoriasis, unspecified: Secondary | ICD-10-CM

## 2022-05-16 DIAGNOSIS — M51369 Other intervertebral disc degeneration, lumbar region without mention of lumbar back pain or lower extremity pain: Secondary | ICD-10-CM

## 2022-05-16 DIAGNOSIS — L409 Psoriasis, unspecified: Secondary | ICD-10-CM

## 2022-05-16 DIAGNOSIS — M79672 Pain in left foot: Secondary | ICD-10-CM

## 2022-05-16 DIAGNOSIS — Z8739 Personal history of other diseases of the musculoskeletal system and connective tissue: Secondary | ICD-10-CM | POA: Diagnosis not present

## 2022-05-16 DIAGNOSIS — G4709 Other insomnia: Secondary | ICD-10-CM

## 2022-05-16 DIAGNOSIS — Z9884 Bariatric surgery status: Secondary | ICD-10-CM

## 2022-05-16 DIAGNOSIS — M19042 Primary osteoarthritis, left hand: Secondary | ICD-10-CM

## 2022-05-16 MED ORDER — ATORVASTATIN CALCIUM 40 MG PO TABS: 40.0000 mg | ORAL_TABLET | Freq: Every day | ORAL | 0 refills | Status: DC

## 2022-05-24 ENCOUNTER — Other Ambulatory Visit: Payer: Self-pay | Admitting: Neurology

## 2022-05-31 ENCOUNTER — Other Ambulatory Visit: Payer: Self-pay

## 2022-06-12 ENCOUNTER — Other Ambulatory Visit: Payer: Self-pay | Admitting: Rheumatology

## 2022-06-13 NOTE — Telephone Encounter (Signed)
Next Visit: 11/14/2022  Last Visit: 05/16/2022  Last Fill: 05/12/2022  Dx: Other insomnia   Current Dose per office note on 05/16/2022: Ambien 5 mg 1 tablet at bedtime for insomnia.    Okay to refill Ambien?

## 2022-06-20 ENCOUNTER — Other Ambulatory Visit: Payer: Self-pay

## 2022-06-23 ENCOUNTER — Ambulatory Visit: Payer: PPO

## 2022-06-28 ENCOUNTER — Telehealth: Payer: Self-pay | Admitting: Family Medicine

## 2022-06-28 DIAGNOSIS — R195 Other fecal abnormalities: Secondary | ICD-10-CM

## 2022-06-28 LAB — COLOGUARD: COLOGUARD: POSITIVE — AB

## 2022-06-28 NOTE — Telephone Encounter (Signed)
Please inform patient of positive cologuard testing. A positive result does not necessarily mean cancer. It means that Cologuard detected DNA and/or  biomarkers in the stool which are associated with colon cancer or precancer. Patients with a positive result should have a diagnostic colonoscopy. Not investigating further now, could result in missed opportunity to diagnose and treat colon cancer or precancer. Referral placed to [facility] for her

## 2022-06-28 NOTE — Telephone Encounter (Signed)
LM for pt to return call to discuss.  

## 2022-06-29 ENCOUNTER — Encounter: Payer: Self-pay | Admitting: Family Medicine

## 2022-06-29 NOTE — Telephone Encounter (Signed)
Spoke with patient regarding results/recommendations.  

## 2022-06-30 NOTE — Telephone Encounter (Signed)
noted 

## 2022-07-03 ENCOUNTER — Ambulatory Visit: Payer: PPO | Admitting: Family Medicine

## 2022-07-04 ENCOUNTER — Encounter: Payer: Self-pay | Admitting: Pharmacist

## 2022-07-05 ENCOUNTER — Telehealth: Payer: Self-pay

## 2022-07-06 ENCOUNTER — Encounter: Payer: Self-pay | Admitting: Family Medicine

## 2022-07-06 ENCOUNTER — Ambulatory Visit (INDEPENDENT_AMBULATORY_CARE_PROVIDER_SITE_OTHER): Payer: PPO | Admitting: Family Medicine

## 2022-07-06 VITALS — BP 150/87 | HR 67 | Temp 97.8°F | Wt 162.6 lb

## 2022-07-06 DIAGNOSIS — M542 Cervicalgia: Secondary | ICD-10-CM

## 2022-07-06 DIAGNOSIS — M47816 Spondylosis without myelopathy or radiculopathy, lumbar region: Secondary | ICD-10-CM | POA: Diagnosis not present

## 2022-07-06 DIAGNOSIS — E1142 Type 2 diabetes mellitus with diabetic polyneuropathy: Secondary | ICD-10-CM | POA: Diagnosis not present

## 2022-07-06 DIAGNOSIS — F419 Anxiety disorder, unspecified: Secondary | ICD-10-CM

## 2022-07-06 DIAGNOSIS — E782 Mixed hyperlipidemia: Secondary | ICD-10-CM | POA: Diagnosis not present

## 2022-07-06 DIAGNOSIS — F32A Depression, unspecified: Secondary | ICD-10-CM | POA: Diagnosis not present

## 2022-07-06 DIAGNOSIS — M5030 Other cervical disc degeneration, unspecified cervical region: Secondary | ICD-10-CM

## 2022-07-06 DIAGNOSIS — E559 Vitamin D deficiency, unspecified: Secondary | ICD-10-CM | POA: Diagnosis not present

## 2022-07-06 DIAGNOSIS — I10 Essential (primary) hypertension: Secondary | ICD-10-CM

## 2022-07-06 LAB — CBC WITH DIFFERENTIAL/PLATELET
Basophils Absolute: 0.1 10*3/uL (ref 0.0–0.1)
Basophils Relative: 1 % (ref 0.0–3.0)
Eosinophils Absolute: 0.2 10*3/uL (ref 0.0–0.7)
Eosinophils Relative: 2.9 % (ref 0.0–5.0)
HCT: 40.7 % (ref 36.0–46.0)
Hemoglobin: 13.5 g/dL (ref 12.0–15.0)
Lymphocytes Relative: 27.7 % (ref 12.0–46.0)
Lymphs Abs: 1.9 10*3/uL (ref 0.7–4.0)
MCHC: 33.2 g/dL (ref 30.0–36.0)
MCV: 86.8 fl (ref 78.0–100.0)
Monocytes Absolute: 0.5 10*3/uL (ref 0.1–1.0)
Monocytes Relative: 7.5 % (ref 3.0–12.0)
Neutro Abs: 4.1 10*3/uL (ref 1.4–7.7)
Neutrophils Relative %: 60.9 % (ref 43.0–77.0)
Platelets: 282 10*3/uL (ref 150.0–400.0)
RBC: 4.69 Mil/uL (ref 3.87–5.11)
RDW: 13.6 % (ref 11.5–15.5)
WBC: 6.7 10*3/uL (ref 4.0–10.5)

## 2022-07-06 LAB — MAGNESIUM: Magnesium: 1.6 mg/dL (ref 1.5–2.5)

## 2022-07-06 LAB — TSH: TSH: 2.47 u[IU]/mL (ref 0.35–5.50)

## 2022-07-06 LAB — VITAMIN D 25 HYDROXY (VIT D DEFICIENCY, FRACTURES): VITD: 72.81 ng/mL (ref 30.00–100.00)

## 2022-07-06 MED ORDER — DULOXETINE HCL 60 MG PO CPEP: 60.0000 mg | ORAL_CAPSULE | Freq: Every day | ORAL | 5 refills | Status: DC

## 2022-07-06 MED ORDER — METOPROLOL TARTRATE 25 MG PO TABS: ORAL_TABLET | ORAL | 5 refills | Status: DC

## 2022-07-06 MED ORDER — LISINOPRIL 10 MG PO TABS: 10.0000 mg | ORAL_TABLET | Freq: Every day | ORAL | 1 refills | Status: DC

## 2022-07-06 MED ORDER — SERTRALINE HCL 100 MG PO TABS: 100.0000 mg | ORAL_TABLET | Freq: Every day | ORAL | 5 refills | Status: DC

## 2022-07-06 MED ORDER — ATORVASTATIN CALCIUM 40 MG PO TABS: 40.0000 mg | ORAL_TABLET | Freq: Every day | ORAL | 11 refills | Status: DC

## 2022-07-06 MED ORDER — PREGABALIN 75 MG PO CAPS: 75.0000 mg | ORAL_CAPSULE | Freq: Two times a day (BID) | ORAL | 5 refills | Status: DC

## 2022-07-06 MED ORDER — FLUTICASONE PROPIONATE 50 MCG/ACT NA SUSP: 2.0000 | Freq: Two times a day (BID) | NASAL | 6 refills | Status: DC

## 2022-07-06 MED ORDER — BACLOFEN 10 MG PO TABS: 5.0000 mg | ORAL_TABLET | Freq: Two times a day (BID) | ORAL | 5 refills | Status: DC

## 2022-07-06 NOTE — Progress Notes (Signed)
Patient Care Team    Relationship Specialty Notifications Start End  Ma Hillock, DO PCP - General Family Medicine  08/29/21   [name], MD Consulting Physician Orthopedic Surgery  08/06/15   Philemon Kingdom, MD Consulting Physician Internal Medicine  08/06/15    Comment: endocrine  Mcarthur Rossetti, MD Consulting Physician Orthopedic Surgery  08/06/15   Bo Merino, MD Consulting Physician Rheumatology  08/06/15   Volanda Napoleon, MD Consulting Physician Oncology  11/10/16   Magnus Sinning, MD Consulting Physician Physical Medicine and Rehabilitation  11/10/16   Dermatology, [facility]    11/10/16    Comment: Dr. [place]  Hyatt, Romilda Garret, [place] Consulting Physician Podiatry  12/03/18   Tat, Eustace Quail, DO Consulting Physician Neurology  08/25/21      SUBJECTIVE Chief Complaint  Patient presents with   Hypertension    [place]; meds taken over an hour ago    HPI: Priscilla [name] is a 72 y.o. female present for cmc Hypertension/hyperlipidemia: Pt reports compliance with metoprolol 25 mg BID.  Patient denies chest pain, shortness of breath, dizziness or lower extremity edema.  She reports compliance with baby aspirin and pravastatin. Diet: low sodium.  Exercise: routinely.  RF: HTN, HLD, DM, FHx HD   Peripheral neuropathy/depression/anxiety: Patient reports compliance with Lyrica, Cymbalta 60 mg a day and zoloft 100 mg qd.  She reports the combination has been very helpful to help with her depression, anxiety and neuropathy.  She is still having significant neck discomfort.  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  She is established with Ortho and PMR for her cervical degeneration and radiculopathy.      03/15/2022    3:02 PM 10/21/2021    9:31 AM 08/15/2021   10:08 AM 02/23/2021    8:18 AM 10/15/2020    2:38 PM  Depression screen PHQ 2/9  Decreased Interest 0 1 1 0 0  Down, Depressed, Hopeless 0 1 1 1 2   PHQ - 2 Score 0 2 2 1 2   Altered sleeping   0   2  Tired, decreased energy  3   2  Change in appetite  0   0  Feeling bad or failure about yourself   0   0  Trouble concentrating  0   1  Moving slowly or fidgety/restless  0   1  Suicidal thoughts  0   0  PHQ-9 Score  5   8      10/21/2021    9:31 AM 10/15/2020    2:38 PM 09/23/2019    3:35 PM 03/14/2019    9:55 AM  GAD 7 : Generalized Anxiety Score  Nervous, Anxious, on Edge 3 2 3  0  Control/stop worrying 2 2 3  0  Worry too much - different things 3 2 3  0  Trouble relaxing 2 0 0 0  Restless 0 0 0 0  Easily annoyed or irritable 1 1 0 0  Afraid - awful might happen 1 0 0 0  Total GAD 7 Score 12 7 9  0  Anxiety Difficulty   Not difficult at all Not difficult at all    Patient Active Problem List   Diagnosis Date Noted   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   Neck pain 08/16/2021   Decreased circulation right great toe 05/28/2020   Pain in right hip 11/27/2019   Vitamin D deficiency 10/13/2016   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   Gastric bypass status for obesity 08/31/2016   Iron  deficiency anemia 04/28/2016   Other insomnia 03/14/2016   DDD (degenerative disc disease), cervical 03/14/2016   Spondylosis without myelopathy or radiculopathy, lumbar region 01/31/2016   Anxiety and depression 06/25/2014   Psoriatic arthritis - sees rheumatologist 02/20/2014   Asthma 12/14/2008   Diabetic peripheral neuropathy associated with type 2 diabetes mellitus ([place]) 08/06/2008   Hyperlipemia 11/12/2006   Essential hypertension 11/12/2006    Social History   Tobacco Use   Smoking status: Never    Passive exposure: Never   Smokeless tobacco: Never  Substance Use Topics   Alcohol use: Yes    Comment: glass of wine-special occasion    Current Outpatient Medications:    aspirin 81 MG tablet, Take 81 mg by mouth daily., Disp: , Rfl:    baclofen (LIORESAL) 10 MG tablet, Take 0.5-1 tablets (5-10 mg total) by mouth in the morning and at bedtime., Disp: 60 each, Rfl: 5   BD PEN NEEDLE NANO  2ND GEN 32G X 4 MM MISC, USE AS DIRECTED THREE TIMES DAILY FOR INSULIN, Disp: 300 each, Rfl: 3   calcium-vitamin D 250-100 MG-UNIT per tablet, Take 1 tablet by mouth daily. , Disp: , Rfl:    COLLAGEN PO, Take by mouth. Adds to tea daily, Disp: , Rfl:    Continuous Blood Gluc Receiver (FREESTYLE LIBRE 2 READER) DEVI, Use as instructed to check blood sugar 4X daily, Disp: 1 each, Rfl: 0   Continuous Blood Gluc Sensor (FREESTYLE LIBRE 2 SENSOR) MISC, Use sensors with CGM to check sugars., Disp: 6 each, Rfl: 3   Cyanocobalamin (VITAMIN B12 PO), Place under the tongue., Disp: , Rfl:    glucose blood (ONETOUCH VERIO) test strip, USE TO TEST BLOOD SUGAR TWICE A DAY DX: E11.319, Disp: 150 each, Rfl: 3   insulin detemir (LEVEMIR) 100 UNIT/ML injection, Inject 0.13-0.15 mLs (13-15 Units total) into the skin at bedtime., Disp: 30 mL, Rfl: 3   Insulin Syringe-Needle U-100 (BD INSULIN SYRINGE U/F) 31G X 5/16" 0.5 ML MISC, Use 1x a day for insulin, Disp: 100 each, Rfl: 3   ketoconazole (NIZORAL) 2 % cream, Apply to affected area twice daily., Disp: 60 g, Rfl: 3   lisinopril (ZESTRIL) 10 MG tablet, Take 1 tablet (10 mg total) by mouth daily., Disp: 90 tablet, Rfl: 1   meloxicam (MOBIC) 15 MG tablet, TAKE 1 TABLET(15 MG) BY MOUTH DAILY, Disp: 90 tablet, Rfl: 3   metFORMIN (GLUCOPHAGE) 1000 MG tablet, TAKE 1 TABLET BY MOUTH TWICE DAILY WITH MEALS, Disp: 180 tablet, Rfl: 3   NOVOLOG FLEXPEN 100 UNIT/ML FlexPen, Inject 2-4 Units into the skin 3 (three) times daily with meals. (Patient taking differently: Inject 2-6 Units into the skin 3 (three) times daily with meals.), Disp: 15 mL, Rfl: 2   Vitamin D, Ergocalciferol, (DRISDOL) 1.25 MG (50000 UNIT) CAPS capsule, TAKE 1 CAPSULE BY MOUTH EVERY 7 DAYS, Disp: 4 capsule, Rfl: 6   zolpidem (AMBIEN) 5 MG tablet, TAKE 1 TABLET BY MOUTH AT BEDTIME FOR INSOMNIA, Disp: 30 tablet, Rfl: 0   atorvastatin (LIPITOR) 40 MG tablet, Take 1 tablet (40 mg total) by mouth daily., Disp: 30  tablet, Rfl: 11   DULoxetine (CYMBALTA) 60 MG capsule, Take 1 capsule (60 mg total) by mouth daily., Disp: 30 capsule, Rfl: 5   fluticasone (FLONASE) 50 MCG/ACT nasal spray, Place 2 sprays into both nostrils in the morning and at bedtime., Disp: 16 g, Rfl: 6   metoprolol tartrate (LOPRESSOR) 25 MG tablet, TAKE 1 TABLET(25 MG) BY MOUTH TWICE  DAILY, Disp: 60 tablet, Rfl: 5   pregabalin (LYRICA) 75 MG capsule, Take 1 capsule (75 mg total) by mouth 2 (two) times daily., Disp: 60 capsule, Rfl: 5   sertraline (ZOLOFT) 100 MG tablet, Take 1 tablet (100 mg total) by mouth daily., Disp: 30 tablet, Rfl: 5  No Known Allergies  OBJECTIVE: BP (!) 150/87   Pulse 67   Temp 97.8 F (36.6 C)   Wt 162 lb 9.6 oz (73.8 kg)   LMP  (LMP Unknown)   SpO2 98%   BMI 28.80 kg/m  Physical Exam Vitals and nursing note reviewed.  Constitutional:      General: She is not in acute distress.    Appearance: Normal appearance. She is not ill-appearing, toxic-appearing or diaphoretic.  HENT:     Head: Normocephalic and atraumatic.  Eyes:     General: No scleral icterus.       Right eye: No discharge.        Left eye: No discharge.     Extraocular Movements: Extraocular movements intact.     Conjunctiva/sclera: Conjunctivae normal.     Pupils: Pupils are equal, round, and reactive to light.  Cardiovascular:     Rate and Rhythm: Normal rate and regular rhythm.  Pulmonary:     Effort: Pulmonary effort is normal. No respiratory distress.     Breath sounds: Normal breath sounds. No wheezing, rhonchi or rales.  Musculoskeletal:     Right lower leg: No edema.     Left lower leg: No edema.  Skin:    General: Skin is warm.     Findings: No rash.  Neurological:     Mental Status: She is alert and oriented to person, place, and time. Mental status is at baseline.     Motor: No weakness.     Gait: Gait normal.  Psychiatric:        Mood and Affect: Mood normal.        Behavior: Behavior normal.        Thought  Content: Thought content normal.        Judgment: Judgment normal.     ASSESSMENT AND PLAN: Priscilla [name] is a 72 y.o. female present for  Essential hypertension/palpitations/HLD/overweight Stable -Continue metoprolol 25 mg daily -Add back lisinopril 10 mg daily  continue liptior  Hydrate - low sodium. Routine exercise.  - con't baby ASA CBC and TSH collected today.  Reviewed CMP and lipids collected 2 months ago.  Vitamin D deficiency: Vitamin D collected today Continue supplementation   Anxiety and depression/neuropathy/neck pain Stable Continue Cymbalta 60 mg daily Continue Zoloft 100 mg.   Continue lyrica 75 mg BID Added baclofen 5-10 mg twice daily  type 2 diabetes mellitus with retinopathy, with long-term current use of insulin ([place]) Managed by endocrinology.  A1c is at goal. Urine microalb UTD   Orders Placed This Encounter  Procedures   Vitamin D (25 hydroxy)   Magnesium   CBC w/Diff   TSH    Meds ordered this encounter  Medications   DULoxetine (CYMBALTA) 60 MG capsule    Sig: Take 1 capsule (60 mg total) by mouth daily.    Dispense:  30 capsule    Refill:  5   pregabalin (LYRICA) 75 MG capsule    Sig: Take 1 capsule (75 mg total) by mouth 2 (two) times daily.    Dispense:  60 capsule    Refill:  5   sertraline (ZOLOFT) 100 MG tablet    Sig:  Take 1 tablet (100 mg total) by mouth daily.    Dispense:  30 tablet    Refill:  5   atorvastatin (LIPITOR) 40 MG tablet    Sig: Take 1 tablet (40 mg total) by mouth daily.    Dispense:  30 tablet    Refill:  11   metoprolol tartrate (LOPRESSOR) 25 MG tablet    Sig: TAKE 1 TABLET(25 MG) BY MOUTH TWICE DAILY    Dispense:  60 tablet    Refill:  5   fluticasone (FLONASE) 50 MCG/ACT nasal spray    Sig: Place 2 sprays into both nostrils in the morning and at bedtime.    Dispense:  16 g    Refill:  6   lisinopril (ZESTRIL) 10 MG tablet    Sig: Take 1 tablet (10 mg total) by mouth daily.    Dispense:  90  tablet    Refill:  1   baclofen (LIORESAL) 10 MG tablet    Sig: Take 0.5-1 tablets (5-10 mg total) by mouth in the morning and at bedtime.    Dispense:  60 each    Refill:  5    Referral Orders  No referral(s) requested today      Howard Pouch, DO 07/06/2022

## 2022-07-06 NOTE — Patient Instructions (Addendum)
No follow-ups on file.        Great to see you today.  I have refilled the medication(s) we provide.   If labs were collected, we will inform you of lab results once received either by echart message or telephone call.   - echart message- for normal results that have been seen by the patient already.   - telephone call: abnormal results or if patient has not viewed results in their echart.     Lisinopril 10 mg added back for better BP control.  Added baclofen 5-10 mg twice a day, every day, to help with neck spasms.

## 2022-07-11 ENCOUNTER — Other Ambulatory Visit: Payer: Self-pay | Admitting: Rheumatology

## 2022-07-12 NOTE — Telephone Encounter (Signed)
Last Fill: 06/13/2022  Next Visit: 11/14/2022  Last Visit: 05/16/2022   Dx: Other insomnia   Current Dose per office note on 05/16/2022: Ambien 5 mg 1 tablet at bedtime for insomnia.    Okay to refill Ambien?

## 2022-07-14 ENCOUNTER — Ambulatory Visit: Payer: PPO | Admitting: Neurology

## 2022-08-01 ENCOUNTER — Ambulatory Visit
Admission: RE | Admit: 2022-08-01 | Discharge: 2022-08-01 | Disposition: A | Discharge status: Home / Self Care | Payer: PPO | Source: Ambulatory Visit | Attending: Family Medicine | Admitting: Family Medicine

## 2022-08-01 DIAGNOSIS — Z1231 Encounter for screening mammogram for malignant neoplasm of breast: Secondary | ICD-10-CM

## 2022-08-10 ENCOUNTER — Other Ambulatory Visit: Payer: Self-pay | Admitting: Rheumatology

## 2022-08-10 NOTE — Telephone Encounter (Signed)
Last Fill: 07/12/2022  Next Visit: 11/14/2022   Last Visit: 05/16/2022   Dx: Other insomnia   Current Dose per office note on 11/14/2022: Ambien 5 mg 1 tablet at bedtime for insomnia.    Okay to refill Ambien?

## 2022-08-17 ENCOUNTER — Encounter: Payer: Self-pay | Admitting: Internal Medicine

## 2022-08-17 ENCOUNTER — Ambulatory Visit: Payer: PPO | Admitting: Internal Medicine

## 2022-08-17 ENCOUNTER — Telehealth: Payer: Self-pay

## 2022-08-17 VITALS — BP 118/76 | HR 69 | Ht 63.0 in | Wt 162.4 lb

## 2022-08-17 DIAGNOSIS — Z794 Long term (current) use of insulin: Secondary | ICD-10-CM | POA: Diagnosis not present

## 2022-08-17 DIAGNOSIS — E1142 Type 2 diabetes mellitus with diabetic polyneuropathy: Secondary | ICD-10-CM | POA: Diagnosis not present

## 2022-08-17 DIAGNOSIS — E119 Type 2 diabetes mellitus without complications: Secondary | ICD-10-CM

## 2022-08-17 DIAGNOSIS — E785 Hyperlipidemia, unspecified: Secondary | ICD-10-CM | POA: Diagnosis not present

## 2022-08-17 DIAGNOSIS — Z7984 Long term (current) use of oral hypoglycemic drugs: Secondary | ICD-10-CM

## 2022-08-17 DIAGNOSIS — R195 Other fecal abnormalities: Secondary | ICD-10-CM | POA: Diagnosis not present

## 2022-08-17 DIAGNOSIS — E11319 Type 2 diabetes mellitus with unspecified diabetic retinopathy without macular edema: Secondary | ICD-10-CM

## 2022-08-17 LAB — POCT GLYCOSYLATED HEMOGLOBIN (HGB A1C): Hemoglobin A1C: 7.2 % — AB (ref 4.0–5.6)

## 2022-08-17 MED ORDER — SEMAGLUTIDE(0.25 OR 0.5MG/DOS) 2 MG/3ML ~~LOC~~ SOPN: 0.5000 mg | PEN_INJECTOR | SUBCUTANEOUS | 3 refills | Status: DC

## 2022-08-17 MED ORDER — LANTUS SOLOSTAR 100 UNIT/ML ~~LOC~~ SOPN: 15.0000 [IU] | PEN_INJECTOR | Freq: Every day | SUBCUTANEOUS | 11 refills | Status: DC

## 2022-08-17 NOTE — Telephone Encounter (Signed)
Patient Advocate Encounter   Received notification from [facility] that prior authorization is required for Ozempic  Submitted: 08/17/22 Key [id number]  Status is pending

## 2022-08-17 NOTE — Progress Notes (Signed)
Patient ID: Priscilla [name], female   DOB: [birth year], 72 y.o.   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 She previously saw Drs Lucianne Muss (distant past) and [place] (more recently). Last visit with me 4 months ago.  Interim history: She continues to have a lot of stress at home due to her husband who has dementia.  He had back surgery. She has urinary incontinence, no blurry vision, chest pain.  She also has tremors.  She has cervical dystonia.  Reviewed HbA1c levels: Lab Results  Component Value Date   HGBA1C 7.0 (A) 04/18/2022   HGBA1C 6.9 (A) 12/14/2021   HGBA1C 7.0 (A) 08/09/2021   HGBA1C 6.6 (A) 03/07/2021   HGBA1C 6.7 (A) 10/29/2020   HGBA1C 6.2 (A) 06/07/2020   HGBA1C 6.3 (A) 12/30/2019   HGBA1C 6.3 (A) 08/25/2019   HGBA1C 5.9 (A) 04/24/2019   HGBA1C 6.0 (A) 02/13/2019   HGBA1C 6.1 (A) 09/20/2018   HGBA1C 6.4 (A) 05/23/2018   HGBA1C 6.0 (A) 10/17/2017   HGBA1C 6.3 06/13/2017   HGBA1C 5.9 11/24/2016  03/16/2017: HbA1c calculated from fructosamine: 5.6% 10/18/2015: HbA1c calculated from fructosamine: 5.7% 02/2015: HbA1c 7.4%, HbA1c calculated from fructosamine: 5.6%  She is on: - Metformin 1000 mg 2x a day with meals - Levemir 30 >> .Marland Kitchen. 15 >> 13 >> 10-13 >> 13-15 >> 15 units at bedtime >> in am - Mealtime Novolog:  (3) 4-5 >> 4-6 before meals (mostly uses 4 units) >>  4-6 units 15 minutes before meals, but (not using this), occasionally forgetting to take it If sugars before the meal are 60 or lower, please do not take the Novolog dose. If sugars before the meal are 61-80, take only half of the Novolog dose. She was on Victoza 2.5 mg daily >> stopped as she could not afford this.  Pt.checks her sugars more than 4 times a day with her CGM (with receiver):  Previously:  Previously:   Lowest sugar was 42 >> ... 59 >> 70s  >> 70; she has hypoglycemia awareness in the in the 70s. Highest sugar was 300 (steroids) >> 258 >> 250  >> 280 (steroids) >> 265.  Glucometer: Freestyle  -No CKD; last BUN/creatinine:  Lab Results  Component Value Date   BUN 25 (H) 04/18/2022   CREATININE 0.79 04/18/2022  ACR (12/2014): 6.9 On lisinopril 1.25 mg daily  -+ HL; last set of lipids: Lab Results  Component Value Date   CHOL 142 04/18/2022   HDL 64.70 04/18/2022   LDLCALC 64 04/18/2022   TRIG 67.0 04/18/2022   CHOLHDL 2 04/18/2022  On pravastatin 40.  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  She has floaters.  -+ numbness, tingling, burning in her feet - stable.  On B12.  I recommended alpha lipoic acid but she did not take this due to the large size of the pill.  Previously on Neurontin 100 mg daily per podiatry, now seldom, 2/2 being also on Ambien.  Last foot exam 08/09/2021. See Dr. Al Corpus.  Latest TSH normal: Lab Results  Component Value Date   TSH 2.47 07/06/2022   She has a history of lap band surgery >> she does not usually eat large meals but she usually grazes, especially at night She has neck pain and dizziness and was  diagnosed with possible cervical dystonia.  She sees neurology. She has hip pain >> had steroid inj's.  No personal history of pancreatitis or family history of medullary thyroid cancer.  ROS: + See HPI  I reviewed pt's medications, allergies, PMH, social hx, family hx, and changes were documented in the history of present illness. Otherwise, unchanged from my initial visit note.  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 Alvester Morin   PTCA     WRIST SURGERY     LEFT   Social History   Social History   Marital Status: Married    Spouse Name: N/A   Number of Children: N/A   Occupational History   Not on file.   Social History Main Topics   Smoking status: Never Smoker    Smokeless tobacco: Never Used   Alcohol Use: Yes     Comment: glass of wine-specially occasion   Drug Use: No   Current Outpatient Medications on File Prior to Visit  Medication Sig Dispense Refill   aspirin 81 MG tablet Take 81 mg by mouth daily.     atorvastatin (LIPITOR) 40 MG tablet Take 1 tablet (40 mg total) by mouth daily. 30 tablet 11   baclofen (LIORESAL) 10 MG tablet Take 0.5-1 tablets (5-10 mg total) by mouth in the morning and at bedtime. 60 each 5   BD PEN NEEDLE NANO 2ND GEN 32G X 4 MM MISC USE AS DIRECTED THREE TIMES DAILY FOR INSULIN 300 each 3   calcium-vitamin D 250-100 MG-UNIT per tablet Take 1 tablet by mouth daily.      COLLAGEN PO Take by mouth. Adds to tea daily     Continuous Blood Gluc Receiver (FREESTYLE LIBRE 2 READER) DEVI Use as instructed to check blood sugar 4X daily 1 each 0   Continuous Blood Gluc Sensor (FREESTYLE LIBRE 2 SENSOR) MISC Use sensors with CGM to check sugars. 6 each 3   Cyanocobalamin (VITAMIN B12 PO) Place under the tongue.     DULoxetine (CYMBALTA) 60 MG capsule Take 1 capsule (60 mg total) by  mouth daily. 30 capsule 5   fluticasone (FLONASE) 50 MCG/ACT nasal spray Place 2 sprays into both nostrils in the morning and at bedtime. 16 g 6   glucose blood (ONETOUCH VERIO) test strip USE TO TEST BLOOD SUGAR TWICE A DAY DX: E11.319 150 each 3   insulin detemir (LEVEMIR) 100 UNIT/ML injection Inject 0.13-0.15 mLs (13-15 Units total) into the skin at bedtime. 30 mL 3   Insulin Syringe-Needle U-100 (BD INSULIN SYRINGE U/F) 31G X 5/16"  0.5 ML MISC Use 1x a day for insulin 100 each 3   ketoconazole (NIZORAL) 2 % cream Apply to affected area twice daily. 60 g 3   lisinopril (ZESTRIL) 10 MG tablet Take 1 tablet (10 mg total) by mouth daily. 90 tablet 1   meloxicam (MOBIC) 15 MG tablet TAKE 1 TABLET(15 MG) BY MOUTH DAILY 90 tablet 3   metFORMIN (GLUCOPHAGE) 1000 MG tablet TAKE 1 TABLET BY MOUTH TWICE DAILY WITH MEALS 180 tablet 3   metoprolol tartrate (LOPRESSOR) 25 MG tablet TAKE 1 TABLET(25 MG) BY MOUTH TWICE DAILY 60 tablet 5   NOVOLOG FLEXPEN 100 UNIT/ML FlexPen Inject 2-4 Units into the skin 3 (three) times daily with meals. (Patient taking differently: Inject 2-6 Units into the skin 3 (three) times daily with meals.) 15 mL 2   pregabalin (LYRICA) 75 MG capsule Take 1 capsule (75 mg total) by mouth 2 (two) times daily. 60 capsule 5   sertraline (ZOLOFT) 100 MG tablet Take 1 tablet (100 mg total) by mouth daily. 30 tablet 5   Vitamin D, Ergocalciferol, (DRISDOL) 1.25 MG (50000 UNIT) CAPS capsule TAKE 1 CAPSULE BY MOUTH EVERY 7 DAYS 4 capsule 6   zolpidem (AMBIEN) 5 MG tablet TAKE 1 TABLET BY MOUTH AT BEDTIME FOR INSOMNIA 30 tablet 0   No current facility-administered medications on file prior to visit.   No Known Allergies Family History  Problem Relation Age of Onset   Arthritis Mother    Alzheimer's disease Mother    Stroke Father    Alcohol abuse Father    Diabetes Father    Heart disease Father    Early death Father 52   Diabetes Sister    Arthritis Sister    COPD Sister     Arthritis Maternal Aunt    Diabetes Maternal Aunt    Early death Maternal Aunt 50   Diabetes Maternal Uncle    Cancer Paternal Aunt    COPD Paternal Aunt    Heart disease Paternal Aunt    Heart disease Paternal Uncle    Alcohol abuse Paternal Grandfather    Heart disease Paternal Grandfather    Early death Paternal Grandfather 9   Stroke Paternal Grandfather    Hypertension Son    Autoimmune disease Daughter    Breast cancer Neg Hx    PE: BP 118/76 (BP Location: Right Arm, Patient Position: Sitting, Cuff Size: Normal)   Pulse 69   Ht 5\' 3"  (1.6 m)   Wt 162 lb 6.4 oz (73.7 kg)   LMP  (LMP Unknown)   SpO2 99%   BMI 28.77 kg/m   Wt Readings from Last 3 Encounters:  08/17/22 162 lb 6.4 oz (73.7 kg)  07/06/22 162 lb 9.6 oz (73.8 kg)  05/16/22 160 lb (72.6 kg)   Constitutional: Normal weight, in NAD Eyes: EOMI, no exophthalmos ENT: no thyromegaly, no cervical lymphadenopathy Cardiovascular: RRR, No MRG Respiratory: CTA B Musculoskeletal: no deformities Skin: no rashes Neurological: + tremor with outstretched hands Diabetic Foot Exam - Simple   Simple Foot Form Diabetic Foot exam was performed with the following findings: Yes 08/17/2022 10:42 AM  Visual Inspection No deformities, no ulcerations, no other skin breakdown bilaterally: Yes Sensation Testing See comments: Yes Pulse Check Posterior Tibialis and Dorsalis pulse intact bilaterally: Yes Comments Decreased sensation to monofilament B feet Onychodystrophy R toenails    ASSESSMENT: 1. DM2, insulin-dependent, uncontrolled, with complications - Diabetic retinopathy left eye - resolved - Peripheral neuropathy  Component  Latest Ref Rng & Units 04/24/2019  Hemoglobin A1C     4.0 - 5.6 % 5.9 (A)  Islet Cell Ab     Neg:<1:1 Negative  ZNT8 Antibodies     U/mL <15  C-Peptide     0.80 - 3.85 ng/mL 2.35  Glucose, Plasma     65 - 99 mg/dL 098 (H)  Glutamic Acid Decarb Ab     <5 IU/mL <5  No insulin deficiency  or pancreatic autoimmunity.  2. HL  3. PN   PLAN:  1. Patient with longstanding, fairly well-controlled type 2 diabetes, on metformin and basal/bolus insulin regimen fairly good control.  At last visit, HbA1c was slightly higher, at 7.0% and sugars are mostly fluctuating within the target range with occasional hyperglycemia after lunch and dinner, but usually with values lower than 180.  We did not change her regimen at that time but I did advise her to vary the dose of NovoLog based on the size and consistency of her meals. CGM interpretation: -At today's visit, we reviewed her CGM downloads: It appears that 89% of values are in target range (goal >70%), while 11% are higher than 180 (goal <25%), and 0% are lower than 70 (goal <4%).  The calculated average blood sugar is 134.  The projected HbA1c for the next 3 months (GMI) is 6.5%. -Reviewing the CGM trends, sugars appear to be fluctuating mostly within the target range, but she has-likely make spikes after breakfast and dinner.  She is not taking the recommended higher doses of NovoLog before larger meals and she occasionally forgets NovoLog completely.  At today's visit, she inquires about Ozempic.  She does not have contraindications to this and we discussed about potential side effects.  We can try this at a low dose and increase as tolerated.  We discussed that we may need to decrease the dose of NovoLog while on Ozempic. - I advised her to:  Patient Instructions  Please continue: - Metformin 1000 mg 2x a day with meals - Levemir/Lantus 15 units daily in am - Novolog 4-6 units before meals  Please start Ozempic 0.25 mg weekly in a.m. (for example on Sunday morning) x 4 weeks, then increase to 0.5 mg weekly in a.m. if no nausea or hypoglycemia.  Please come back for a follow-up appointment in 4 months.  - we checked her HbA1c: 7.2% (slightly higher) - advised to check sugars at different times of the day - 4x a day, rotating check  times - advised for yearly eye exams >> she is UTD - return to clinic in 4 months   2. HL -Reviewed lipid panel: All fractions at goal: Lab Results  Component Value Date   CHOL 142 04/18/2022   HDL 64.70 04/18/2022   LDLCALC 64 04/18/2022   TRIG 67.0 04/18/2022   CHOLHDL 2 04/18/2022  -She can use pravastatin 40 mg daily without side effects  3. PN -Due to diabetes -Stable -I suggested alpha lipoid acid in the past but she could not take it due to large tablet size -She is on B12 vitamin, Cymbalta, and gabapentin-per podiatry.  These are helping  Carlus Pavlov, MD PhD [facility]

## 2022-08-17 NOTE — Patient Instructions (Addendum)
Please continue: - Metformin 1000 mg 2x a day with meals - Levemir/Lantus 15 units daily in am - Novolog 4-6 units before meals  Please start Ozempic 0.25 mg weekly in a.m. (for example on Sunday morning) x 4 weeks, then increase to 0.5 mg weekly in a.m. if no nausea or hypoglycemia.  Please come back for a follow-up appointment in 4 months.

## 2022-08-27 NOTE — Telephone Encounter (Signed)
Pharmacy Patient Advocate Encounter  Prior Authorization has been APPROVED  Effective dates: 08/25/22 through 08/25/23

## 2022-08-29 IMAGING — MR MR HIP*R* W/O CM
7 series · 40 of 40 positions shown · non-contrast
Comparison: Plain films right hip 01/23/2016.

CLINICAL DATA: Right hip pain for 10 months.  No known injury.

EXAM:
MR OF THE RIGHT HIP WITHOUT CONTRAST
TECHNIQUE: Multiplanar, multisequence MR imaging was performed. No intravenous
contrast was administered.

[Series 8: T2 fat-sat · coronal · right · 3.0mm · 0.91mm/px · 4 of 27 slices shown (1 of 3)]
[im 1/27]
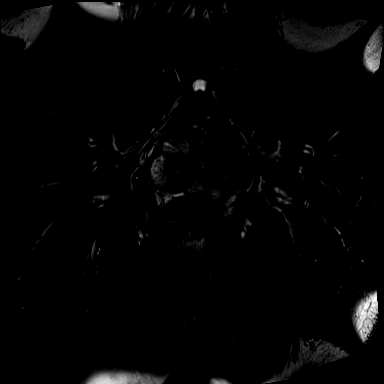
[im 9/27]
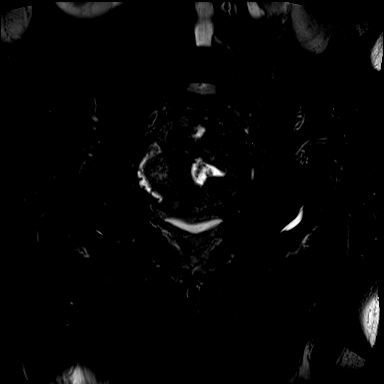
[im 18/27]
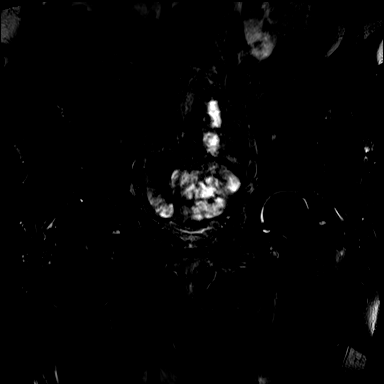
[im 27/27]
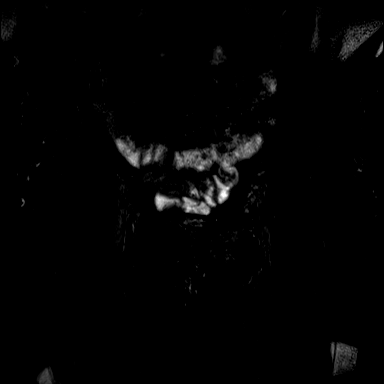

[Series 10: T1 · coronal · right · 3.0mm · 0.91mm/px · 4 of 27 slices shown]
[im 1/27]
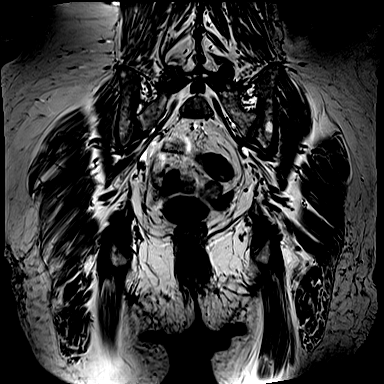
[im 9/27]
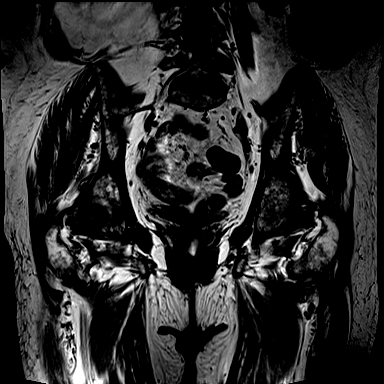
[im 18/27]
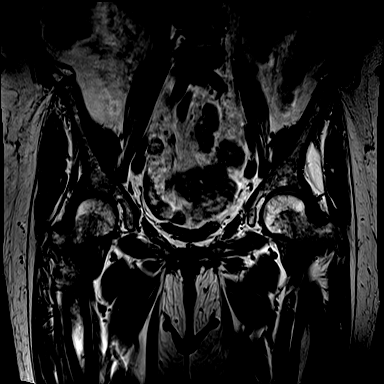
[im 27/27]
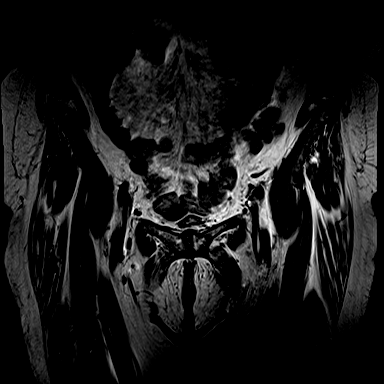

[Series 11: T2 fat-sat · axial · right · 3.0mm · 0.56mm/px · z∈[-156,-21]mm · 7 of 39 slices shown (2 of 3)]
[im 1/39]
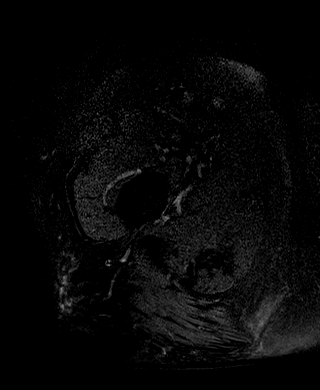
[im 7/39]
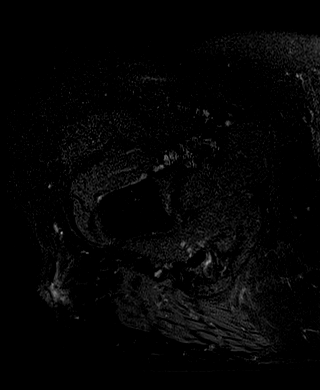
[im 13/39]
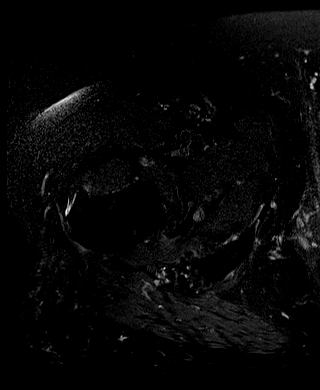
[im 20/39]
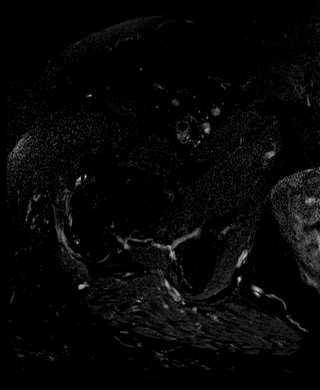
[im 26/39]
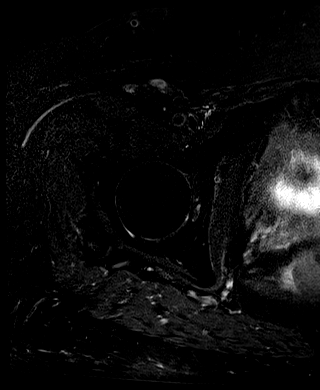
[im 32/39]
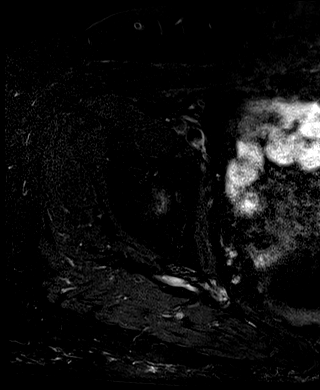
[im 39/39]
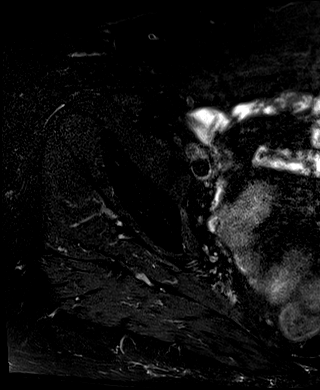

[Series 12: PD fat-sat · coronal · right · 3.0mm · 0.56mm/px · 5 of 28 slices shown (1 of 2)]
[im 1/28]
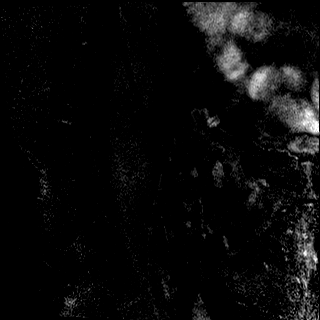
[im 7/28]
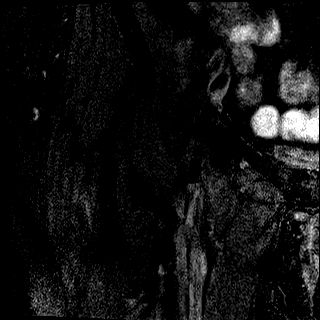
[im 14/28]
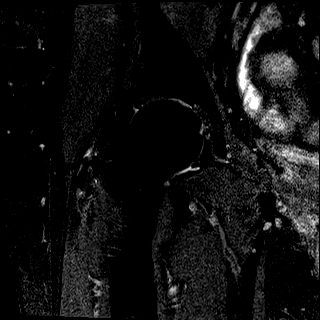
[im 21/28]
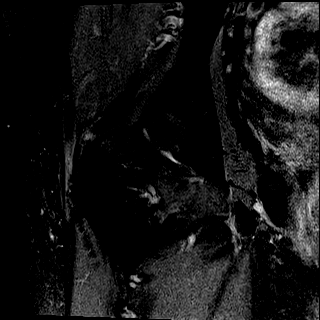
[im 28/28]
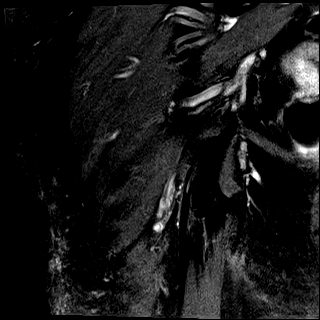

[Series 13: T2 fat-sat · axial · right · 3.0mm · 0.70mm/px · z∈[-156,-21]mm · 7 of 39 slices shown (3 of 3)]
[im 1/39]
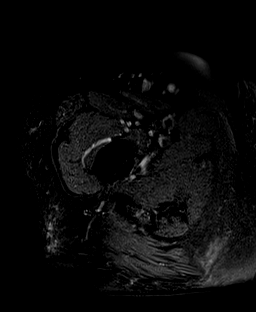
[im 7/39]
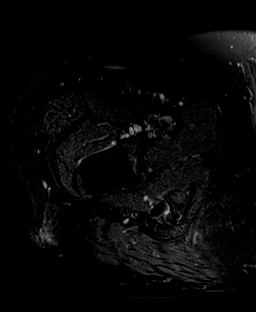
[im 13/39]
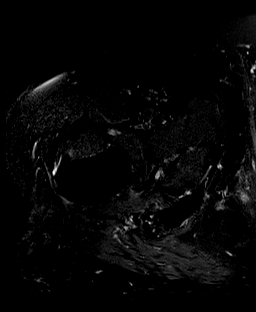
[im 20/39]
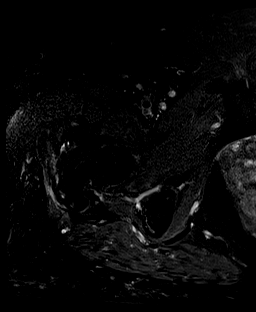
[im 26/39]
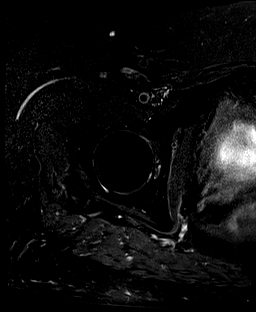
[im 32/39]
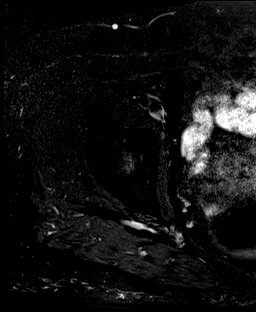
[im 39/39]
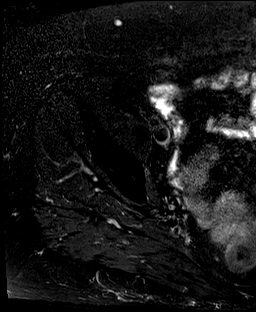

[Series 14: PD fat-sat · sagittal · right · 3.0mm · 0.56mm/px · 6 of 35 slices shown (2 of 2)]
[im 1/35]
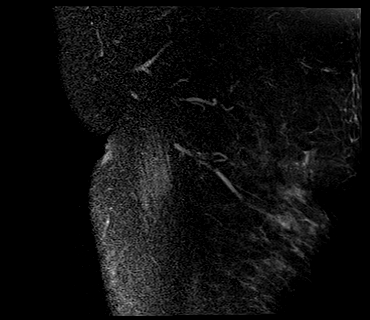
[im 7/35]
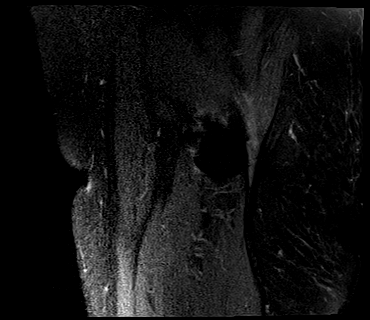
[im 14/35]
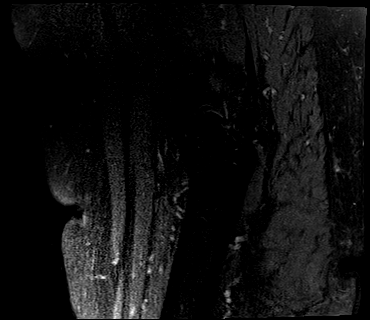
[im 21/35]
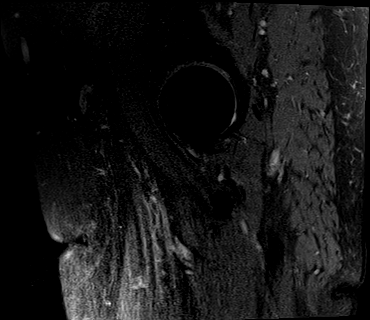
[im 28/35]
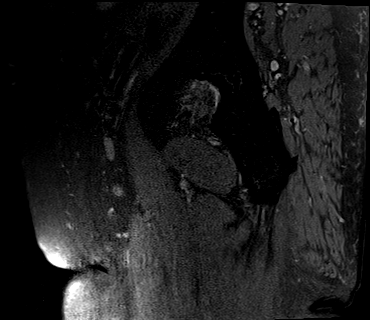
[im 35/35]
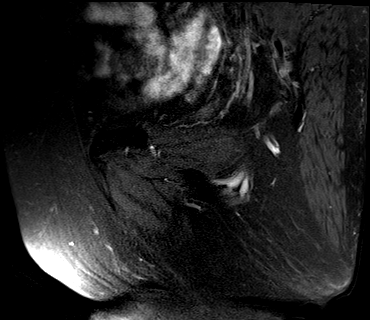

[Series 15: T1 fat-sat · axial · right · 3.0mm · 0.56mm/px · z∈[-156,-21]mm · 7 of 39 slices shown]
[im 1/39]
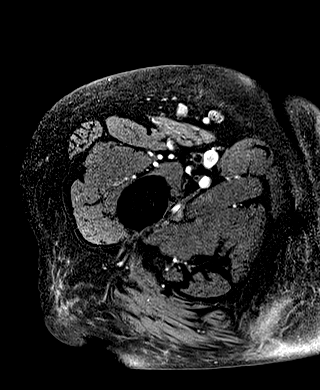
[im 7/39]
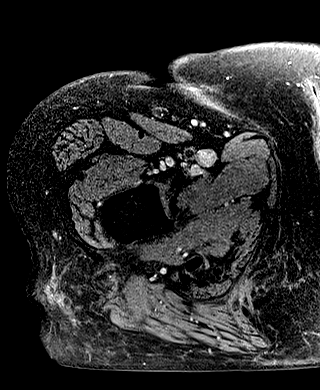
[im 13/39]
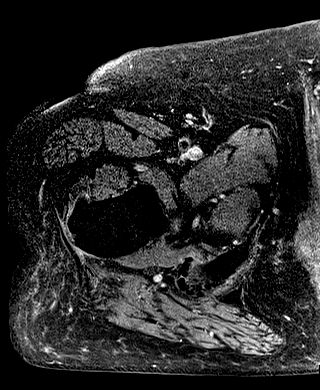
[im 20/39]
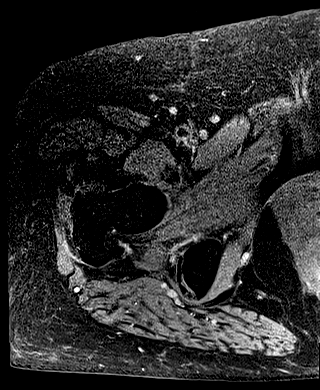
[im 26/39]
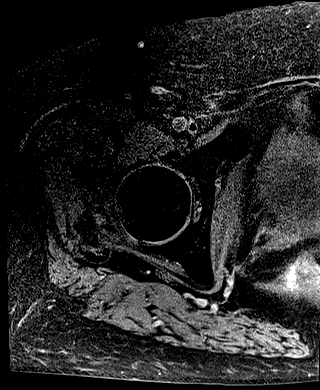
[im 32/39]
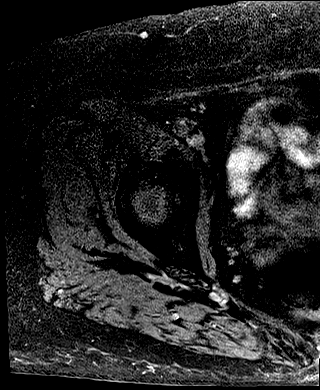
[im 39/39]
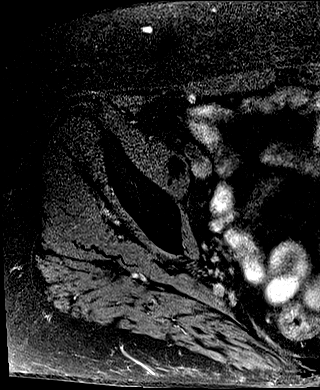

[40 of 40 positions shown; findings below may reference images not displayed]

FINDINGS: Bones: No fracture, stress change or worrisome lesion is identified.
There is no subchondral edema or cyst formation about either hip. No
avascular necrosis of the femoral heads. Marrow signal is diffusely
heterogeneous with an appearance most suggestive reactivation as can
be seen in obesity.

Articular cartilage and labrum

Articular cartilage:  Thinned and degenerated without focal defect.

Labrum:  Intact.

Joint or bursal effusion

Joint effusion:  Negative.

Bursae: There is a small volume of fluid in the right trochanteric
bursa.

Muscles and tendons

Muscles and tendons: Intact. Mild gluteus medius and right hamstring
tendinosis noted.

Other findings

Miscellaneous: Imaged intrapelvic contents demonstrate postoperative
change of hysterectomy. No acute or focal abnormality is identified.
IMPRESSION: Mild appearing right hip osteoarthritis.

Small volume of fluid in the right trochanteric bursa compatible
with bursitis.

Mild appearing right gluteus medius and hamstring tendinosis without
tear.

## 2022-09-28 ENCOUNTER — Encounter: Payer: Self-pay | Admitting: Physical Medicine and Rehabilitation

## 2022-09-28 ENCOUNTER — Other Ambulatory Visit: Payer: Self-pay | Admitting: Physical Medicine and Rehabilitation

## 2022-09-28 DIAGNOSIS — M25551 Pain in right hip: Secondary | ICD-10-CM

## 2022-10-05 ENCOUNTER — Other Ambulatory Visit: Payer: Self-pay | Admitting: Hematology & Oncology

## 2022-10-05 ENCOUNTER — Other Ambulatory Visit: Payer: Self-pay

## 2022-10-05 DIAGNOSIS — E559 Vitamin D deficiency, unspecified: Secondary | ICD-10-CM

## 2022-10-05 NOTE — Telephone Encounter (Signed)
Pharmacy faxed refill request for early refills on Metoprolol and Lisinopril. Both last refilled on 07/06/22 (90,1) which is a 6 month supply.   Unable to complete request at this time.

## 2022-10-11 DIAGNOSIS — D2362 Other benign neoplasm of skin of left upper limb, including shoulder: Secondary | ICD-10-CM | POA: Diagnosis not present

## 2022-10-11 DIAGNOSIS — L821 Other seborrheic keratosis: Secondary | ICD-10-CM | POA: Diagnosis not present

## 2022-10-11 DIAGNOSIS — L918 Other hypertrophic disorders of the skin: Secondary | ICD-10-CM | POA: Diagnosis not present

## 2022-10-11 DIAGNOSIS — D1801 Hemangioma of skin and subcutaneous tissue: Secondary | ICD-10-CM | POA: Diagnosis not present

## 2022-10-11 DIAGNOSIS — L858 Other specified epidermal thickening: Secondary | ICD-10-CM | POA: Diagnosis not present

## 2022-10-11 DIAGNOSIS — D225 Melanocytic nevi of trunk: Secondary | ICD-10-CM | POA: Diagnosis not present

## 2022-10-11 DIAGNOSIS — L814 Other melanin hyperpigmentation: Secondary | ICD-10-CM | POA: Diagnosis not present

## 2022-10-11 DIAGNOSIS — D224 Melanocytic nevi of scalp and neck: Secondary | ICD-10-CM | POA: Diagnosis not present

## 2022-10-13 ENCOUNTER — Other Ambulatory Visit: Payer: Self-pay

## 2022-10-13 MED ORDER — METFORMIN HCL 1000 MG PO TABS: 1000.0000 mg | ORAL_TABLET | Freq: Two times a day (BID) | ORAL | 2 refills | Status: DC

## 2022-10-23 DIAGNOSIS — R195 Other fecal abnormalities: Secondary | ICD-10-CM | POA: Diagnosis not present

## 2022-10-23 LAB — HM COLONOSCOPY

## 2022-10-24 ENCOUNTER — Ambulatory Visit: Payer: PPO | Admitting: Physical Medicine and Rehabilitation

## 2022-10-24 ENCOUNTER — Other Ambulatory Visit: Payer: Self-pay

## 2022-10-24 ENCOUNTER — Encounter: Payer: Self-pay | Admitting: Physical Medicine and Rehabilitation

## 2022-10-24 DIAGNOSIS — M25551 Pain in right hip: Secondary | ICD-10-CM | POA: Diagnosis not present

## 2022-10-24 NOTE — Progress Notes (Signed)
Functional Pain Scale - descriptive words and definitions  Uncomfortable (3)  Pain is present but can complete all ADL's/sleep is slightly affected and passive distraction only gives marginal relief. Mild range order  Average Pain 5  Ambulates with a walker.  Has right hip pain that stays on the inside and radiates into the muscles a little bit. Hurts to walk [employer]

## 2022-10-24 NOTE — Progress Notes (Signed)
Priscilla [name] - 72 y.o. female MRN [id number]  Date of birth: 1950/07/27  Office Visit Note: Visit Date: 10/24/2022 PCP: Natalia Leatherwood, DO Referred by: Natalia Leatherwood, DO  Subjective: Chief Complaint  Patient presents with   Right Hip - Pain   HPI:  Priscilla [name] is a 72 y.o. female who comes in today for planned repeat Right anesthetic hip arthrogram with fluoroscopic guidance.  The patient has failed conservative care including home exercise, medications, time and activity modification. Prior injection gave more than 50% relief for several months. This injection will be diagnostic and hopefully therapeutic.  Please see requesting physician notes for further details and justification.  Referring: Dr. Doneen Poisson   ROS Otherwise per HPI.  Assessment & Plan: Visit Diagnoses:    ICD-10-CM   1. Pain in right hip  M25.551 Large Joint Inj: R hip joint    XR C-ARM NO REPORT      Plan: No additional findings.   Meds & Orders: No orders of the defined types were placed in this encounter.   Orders Placed This Encounter  Procedures   Large Joint Inj: R hip joint   XR C-ARM NO REPORT    Follow-up: No follow-ups on file.   Procedures: Large Joint Inj: R hip joint on 10/24/2022 1:11 PM Indications: diagnostic evaluation and pain Details: 22 G 3.5 in needle, fluoroscopy-guided anterior approach  Arthrogram: No  Medications: 4 mL bupivacaine 0.25 %; 40 mg triamcinolone acetonide 40 MG/ML Outcome: tolerated well, no immediate complications  There was excellent flow of contrast producing a partial arthrogram of the hip. The patient did have relief of symptoms during the anesthetic phase of the injection. Procedure, treatment alternatives, risks and benefits explained, specific risks discussed. Consent was given by the patient. Immediately prior to procedure a time out was called to verify the correct patient, procedure, equipment, support staff and site/side marked  as required. Patient was prepped and draped in the usual sterile fashion.          Clinical History: EXAM: MRI CERVICAL SPINE WITHOUT CONTRAST   TECHNIQUE: Multiplanar, multisequence MR imaging of the cervical spine was performed. No intravenous contrast was administered.   COMPARISON:  CT cervical 01/28/2021; X-ray cervical 06/04/2019.   FINDINGS: Alignment: Physiologic.   Vertebrae: No fracture, evidence of discitis, or bone lesion. Marrow edema at the left C1-2 facet. STIR hyperintensity on both sides of the right C6-7 facet is likely subchondral cystic change.   Cord: Normal signal and morphology.   Posterior Fossa, vertebral arteries, paraspinal tissues: Negative for perispinal mass or inflammation   Disc levels:   C1-2: Retro dental ligamentous thickening and atlantal dental degeneration. Left more than right degenerative facet spurring with joint space narrowing and spurring potentially affecting the left C2 nerve root.   C2-3: Degenerative facet spurring asymmetric to the left. No neural impingement   C3-4: Disc narrowing and bulging with bilateral uncovertebral ridging. Advanced bilateral facet spurring and bilateral foraminal impingement. Small bilateral paracentral disc protrusion and ridging without neural compression   C4-5: Disc narrowing with asymmetric leftward bulging and left bulky uncovertebral spurring with underlying protrusion. Asymmetric left facet spurring. Advanced left foraminal impingement   C5-6: Disc narrowing and bulging with uncovertebral and facet spurring asymmetric to the left where there is moderate foraminal stenosis.   C6-7: Disc narrowing and bulging with bilateral uncovertebral spurring and foraminal impingement.   C7-T1:Small bilateral foraminal protrusion. Negative facets. Mild right foraminal narrowing based on sagittal images.  IMPRESSION: 1. Generalized cervical spine degeneration with notable active  facet arthritis on the left at C1-2. 2. Degenerative foraminal impingement seen on the left at C3-4 to C6-7 and on the right at C3-4 and C6-7. Potential left C2 impingement related to #1. 3. Diffusely patent spinal canal.     Electronically Signed   By: Tiburcio Pea M.D.   On: 07/27/2021 10:32     Objective:  VS:  HT:    WT:   BMI:     BP:   HR: bpm  TEMP: ( )  RESP:  Physical Exam   Imaging: No results found.

## 2022-10-31 NOTE — Progress Notes (Unsigned)
Office Visit Note  Patient: Priscilla [name]             Date of Birth: [birth year]           MRN: [id number]             PCP: Natalia Leatherwood, DO Referring: Natalia Leatherwood, DO Visit Date: 11/14/2022 Occupation: @GUAROCC @  Subjective:  Right hand pain   History of Present Illness: Priscilla [name] is a 72 y.o. female with history of psoriatic arthritis and DDD.  Patient presents today with increased pain in the right hand.  She has been experiencing increased stiffness leading to increased difficulty making a complete fist.  She denies any joint swelling.  She states her right ring finger has been locking up.  She takes meloxicam 15 mg 1 tablet daily for pain relief.  She has also been started on baclofen and Lyrica by her PCP which has helped manage her neck pain and stiffness.  Patient experiences intermittent flares of psoriasis specially during the summer months with increased humidity and sweating.  She has not been using any topical agents recently.   Activities of Daily Living:  Patient reports morning stiffness for 1 hour.   Patient Denies nocturnal pain.  Difficulty dressing/grooming: Denies Difficulty climbing stairs: Denies Difficulty getting out of chair: Denies Difficulty using hands for taps, buttons, cutlery, and/or writing: Reports  Review of Systems  Constitutional:  Positive for fatigue.  HENT:  Positive for mouth dryness. Negative for mouth sores and nose dryness.   Eyes:  Negative for pain, visual disturbance and dryness.  Respiratory:  Negative for cough, hemoptysis, shortness of breath and difficulty breathing.   Cardiovascular:  Negative for chest pain, palpitations, hypertension and swelling in legs/feet.  Gastrointestinal:  Negative for blood in stool, constipation and diarrhea.  Endocrine: Negative for increased urination.  Genitourinary:  Positive for involuntary urination. Negative for painful urination.  Musculoskeletal:  Positive for joint pain, gait  problem, joint pain, joint swelling, myalgias, muscle weakness, morning stiffness, muscle tenderness and myalgias.  Skin:  Positive for color change. Negative for pallor, rash, hair loss, nodules/bumps, skin tightness, ulcers and sensitivity to sunlight.  Allergic/Immunologic: Negative for susceptible to infections.  Neurological:  Positive for dizziness. Negative for numbness, headaches and weakness.  Hematological:  Negative for swollen glands.  Psychiatric/Behavioral:  Positive for depressed mood and sleep disturbance. The patient is nervous/anxious.     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   Gastric bypass status for obesity 08/31/2016   Iron deficiency anemia 04/28/2016   Other insomnia 03/14/2016   DDD (degenerative disc disease), cervical 03/14/2016   Spondylosis without myelopathy or radiculopathy, lumbar region 01/31/2016   Anxiety and depression 06/25/2014   Psoriatic arthritis - sees rheumatologist 02/20/2014   Asthma 12/14/2008   Diabetic peripheral neuropathy associated with type 2 diabetes mellitus (HCC) 08/06/2008   Hyperlipemia 11/12/2006   Essential hypertension 11/12/2006    Past Medical History:  Diagnosis Date   Anemia 12/14/2008   Anxiety and depression 12/14/2008   Asthma 11/12/2006   CAD (coronary artery disease) 12/14/2008   hx transluminal coronary angioplasty   Cataract    Cervical dystonia    Depression    Diabetes mellitus with neuropathy (HCC) 12/14/2008  Fibromyalgia 01/04/2007   sees rheuamtology   Gastric bypass status for obesity 08/31/2016   Hyperlipemia 01/21/2010   Hypertension 12/14/2008   Leg edema 12/14/2008   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   OSA (obstructive sleep apnea) 08/06/2008    Osteoarthritis 12/14/2008   Palpitations 11/12/2006   hx sinus tachy   Psoriatic arthritis [facility])    sees rheumatology   Rotator cuff injury     Family History  Problem Relation Age of Onset   Arthritis Mother    Alzheimer's disease Mother    Stroke Father    Alcohol abuse Father    Diabetes Father    Heart disease Father    Early death Father 57   Diabetes Sister    Arthritis Sister    COPD Sister    Arthritis Maternal Aunt    Diabetes Maternal Aunt    Early death Maternal Aunt 82   Diabetes Maternal Uncle    Cancer Paternal Aunt    COPD Paternal Aunt    Heart disease Paternal Aunt    Heart disease Paternal Uncle    Alcohol abuse Paternal Grandfather    Heart disease Paternal Grandfather    Early death Paternal Grandfather 20   Stroke Paternal Grandfather    Hypertension Son    Autoimmune disease Daughter    Breast cancer Neg Hx    Past Surgical History:  Procedure Laterality Date   ABDOMINAL HYSTERECTOMY     CHOLECYSTECTOMY     COLONOSCOPY  2024   LAPAROSCOPIC GASTRIC BANDING     Lumbar Facet Joint Intra-Articular Injection(s) with Fluoroscopic Guidance  09/13/2020   L4-5; Dr. Alvester Morin   PTCA     WRIST SURGERY     LEFT   Social History   Social History Narrative   Married, Brett Canales. 2 children name Genevie Cheshire and Victorino Dike.   Some college, retired Comptroller.   Drinks caffeine.   Take a daily vitamin.   Wears her seatbelt, exercises 3 times a week.   Smoke detector in the home.   Firearms in the home.   Feels safe in her relationships.   Right handed   Immunization History  Administered Date(s) Administered   Fluad Quad(high Dose 65+) 12/03/2018, 01/29/2020, 03/24/2022   Influenza Split 01/21/2013   Influenza, High Dose Seasonal PF 02/04/2015, 02/09/2016, 02/11/2018   Influenza,inj,Quad PF,6+ Mos 01/15/2014   Influenza-Unspecified 01/26/2017   Moderna Sars-Covid-2 Vaccination 05/24/2019, 06/21/2019, 12/22/2019   PPD Test 11/15/2020    Pneumococcal Conjugate-13 02/04/2015   Pneumococcal Polysaccharide-23 10/31/2013, 01/29/2020   Tdap 02/07/2013     Objective: Vital Signs: BP 96/65 (BP Location: Right Arm, Patient Position: Sitting, Cuff Size: Normal)   Pulse 67   Resp 13   Ht 5\' 3"  (1.6 m)   Wt 159 lb 6.4 oz (72.3 kg)   LMP  (LMP Unknown)   BMI 28.24 kg/m    Physical Exam Vitals and nursing note reviewed.  Constitutional:      Appearance: She is well-developed.  HENT:     Head: Normocephalic and atraumatic.  Eyes:     Conjunctiva/sclera: Conjunctivae normal.  Cardiovascular:     Rate and Rhythm: Normal rate and regular rhythm.     Heart sounds: Normal heart sounds.  Pulmonary:     Effort: Pulmonary effort is normal.     Breath sounds: Normal breath sounds.  Abdominal:     General: Bowel sounds are normal.     Palpations: Abdomen is soft.  Musculoskeletal:     Cervical back:  Normal range of motion.  Skin:    General: Skin is warm and dry.     Capillary Refill: Capillary refill takes less than 2 seconds.     Comments: Psoriasis on scalp   Neurological:     Mental Status: She is alert and oriented to person, place, and time.  Psychiatric:        Behavior: Behavior normal.      Musculoskeletal Exam: C-spine has limited range of motion with lateral rotation.  No midline spinal tenderness.  No SI joint tenderness.  Shoulder joints have good range of motion with no discomfort.  Elbow joints have good range of motion with no tenderness or inflammation.  PIP and DIP thickening consistent with osteoarthritis of both hands.  Slightly limited range of motion of her right hip.  Left hip has good range of motion with no discomfort.  Knee joints have good range of motion with no warmth or effusion.  Ankle joints have good range of motion with no tenderness or joint swelling.  No evidence of Achilles tendinitis or plantar fasciitis.  CDAI Exam: CDAI Score: -- Patient Global: --; Provider Global: -- Swollen: --;  Tender: -- Joint Exam 11/14/2022   No joint exam has been documented for this visit   There is currently no information documented on the homunculus. Go to the Rheumatology activity and complete the homunculus joint exam.  Investigation: No additional findings.  Imaging: XR C-ARM NO REPORT  Result Date: 10/24/2022 Please see Notes tab for imaging impression.   Recent Labs: Lab Results  Component Value Date   WBC 6.7 07/06/2022   HGB 13.5 07/06/2022   PLT 282.0 07/06/2022   NA 140 04/18/2022   K 4.6 04/18/2022   CL 101 04/18/2022   CO2 28 04/18/2022   GLUCOSE 133 (H) 04/18/2022   BUN 25 (H) 04/18/2022   CREATININE 0.79 04/18/2022   BILITOT 0.4 04/18/2022   ALKPHOS 51 04/18/2022   AST 14 04/18/2022   ALT 13 04/18/2022   PROT 6.8 04/18/2022   ALBUMIN 4.2 04/18/2022   CALCIUM 9.4 04/18/2022   GFRAA >60 11/19/2019    Speciality Comments: No specialty comments available.  Procedures:  No procedures performed Allergies: Patient has no known allergies.   Assessment / Plan:     Visit Diagnoses: Psoriatic arthritis (HCC): She has no synovitis or dactylitis on examination today.  No SI joint pain at this time.  No evidence of Achilles tendinitis or plantar fasciitis.  Patient has been experiencing increased pain in her right hand which seems consistent with osteoarthritis.  No inflammation was noted on examination today.  Discussed that if her symptoms persist or worsen we can schedule an ultrasound to assess for synovitis.  Patient plans on taking meloxicam for pain relief. Patient does not require immunosuppressive therapy at this time.  Psoriasis: Small patches of psoriasis noted on her scalp.  A prescription for clobetasol external solution was sent to the pharmacy today.  Patient is advised to notify us if she develops any other new patches of psoriasis.  Primary osteoarthritis of both hands: She has PIP and DIP thickening consistent with osteoarthritis of both hands.  She  has been experiencing increased discomfort in the right hand.  At times she has difficulty making a complete fist especially first thing in the mornings.  On examination today no synovitis or dactylitis was noted.  If her symptoms persist or worsen I recommended scheduling an ultrasound to assess for synovitis.  Trochanteric bursitis, right hip -  Right trochanteric bursa cortisone injection on 03/13/2021.  She had a right hip injection performed by Dr. Alvester Morin on 06/06/2021.  Patient underwent a right hip injection performed by Dr. Alvester Morin on 10/24/2022 under fluoroscopy guidance.  Pain in both feet: She is not experiencing any increased discomfort in her feet at this time.  No evidence of Achilles tendinitis or plantar fasciitis.  Dystonia - Followed by Dr. Arbutus Leas.   DDD (degenerative disc disease), cervical: She has been taking Lyrica and baclofen which has been helping to manage her neck pain and stiffness.  DDD (degenerative disc disease), lumbar: She takes lyrica and baclofen for symptomatic relief.  History of scoliosis  Fibromyalgia: Patient is taking Lyrica and baclofen which have been helpful at alleviating her discomfort.   Other fatigue: Chronic and secondary to insomnia.   Other insomnia: She is no longer taking ambien.   Osteoporosis screening - DEXA updated on 11/25/20:The BMD measured at Forearm Radius 33% is 0.842 g/cm2 with a T-score of -0.5. She is taking a daily calcium and vitamin D supplement.   History of vitamin D deficiency: She is taking a daily calcium and vitamin D supplement.   Other medical conditions are listed as follows:   Gastric bypass status for obesity  History of diabetes mellitus  History of asthma  Coarse tremors  History of sleep apnea  Anxiety and depression    Orders: No orders of the defined types were placed in this encounter.  Meds ordered this encounter  Medications   clobetasol (TEMOVATE) 0.05 % external solution    Sig: Apply 1  Application topically 2 (two) times daily.    Dispense:  50 mL    Refill:  0    Follow-Up Instructions: Return in about 6 months (around 05/17/2023) for Psoriatic arthritis.   Gearldine Bienenstock, PA-C  Note - This record has been created using Dragon software.  Chart creation errors have been sought, but may not always  have been located. Such creation errors do not reflect on  the standard of medical care.

## 2022-11-03 MED ORDER — BUPIVACAINE HCL 0.25 % IJ SOLN
4.0000 mL | INTRAMUSCULAR | Status: AC | PRN
Start: 2022-10-24 — End: 2022-10-24
  Administered 2022-10-24: 4 mL via INTRA_ARTICULAR

## 2022-11-03 MED ORDER — TRIAMCINOLONE ACETONIDE 40 MG/ML IJ SUSP
40.0000 mg | INTRAMUSCULAR | Status: AC | PRN
Start: 2022-10-24 — End: 2022-10-24
  Administered 2022-10-24: 40 mg via INTRA_ARTICULAR

## 2022-11-07 ENCOUNTER — Encounter: Payer: Self-pay | Admitting: Family Medicine

## 2022-11-07 NOTE — Telephone Encounter (Signed)
Sent message in pt's chart to PCP

## 2022-11-14 ENCOUNTER — Ambulatory Visit: Payer: PPO | Attending: Physician Assistant | Admitting: Physician Assistant

## 2022-11-14 ENCOUNTER — Encounter: Payer: Self-pay | Admitting: Physician Assistant

## 2022-11-14 VITALS — BP 96/65 | HR 67 | Resp 13 | Ht 63.0 in | Wt 159.4 lb

## 2022-11-14 DIAGNOSIS — M5030 Other cervical disc degeneration, unspecified cervical region: Secondary | ICD-10-CM | POA: Diagnosis not present

## 2022-11-14 DIAGNOSIS — M797 Fibromyalgia: Secondary | ICD-10-CM

## 2022-11-14 DIAGNOSIS — G252 Other specified forms of tremor: Secondary | ICD-10-CM

## 2022-11-14 DIAGNOSIS — L409 Psoriasis, unspecified: Secondary | ICD-10-CM | POA: Diagnosis not present

## 2022-11-14 DIAGNOSIS — G249 Dystonia, unspecified: Secondary | ICD-10-CM

## 2022-11-14 DIAGNOSIS — Z8739 Personal history of other diseases of the musculoskeletal system and connective tissue: Secondary | ICD-10-CM | POA: Diagnosis not present

## 2022-11-14 DIAGNOSIS — Z8669 Personal history of other diseases of the nervous system and sense organs: Secondary | ICD-10-CM

## 2022-11-14 DIAGNOSIS — M5136 Other intervertebral disc degeneration, lumbar region: Secondary | ICD-10-CM

## 2022-11-14 DIAGNOSIS — L4050 Arthropathic psoriasis, unspecified: Secondary | ICD-10-CM | POA: Diagnosis not present

## 2022-11-14 DIAGNOSIS — M7061 Trochanteric bursitis, right hip: Secondary | ICD-10-CM | POA: Diagnosis not present

## 2022-11-14 DIAGNOSIS — Z8709 Personal history of other diseases of the respiratory system: Secondary | ICD-10-CM

## 2022-11-14 DIAGNOSIS — R5383 Other fatigue: Secondary | ICD-10-CM | POA: Diagnosis not present

## 2022-11-14 DIAGNOSIS — M79672 Pain in left foot: Secondary | ICD-10-CM

## 2022-11-14 DIAGNOSIS — Z13820 Encounter for screening for osteoporosis: Secondary | ICD-10-CM

## 2022-11-14 DIAGNOSIS — F419 Anxiety disorder, unspecified: Secondary | ICD-10-CM

## 2022-11-14 DIAGNOSIS — G4709 Other insomnia: Secondary | ICD-10-CM

## 2022-11-14 DIAGNOSIS — F32A Depression, unspecified: Secondary | ICD-10-CM

## 2022-11-14 DIAGNOSIS — M79671 Pain in right foot: Secondary | ICD-10-CM

## 2022-11-14 DIAGNOSIS — Z8639 Personal history of other endocrine, nutritional and metabolic disease: Secondary | ICD-10-CM

## 2022-11-14 DIAGNOSIS — M19042 Primary osteoarthritis, left hand: Secondary | ICD-10-CM

## 2022-11-14 DIAGNOSIS — Z9884 Bariatric surgery status: Secondary | ICD-10-CM

## 2022-11-14 DIAGNOSIS — M19041 Primary osteoarthritis, right hand: Secondary | ICD-10-CM

## 2022-11-14 MED ORDER — CLOBETASOL PROPIONATE 0.05 % EX SOLN: 1.0000 | Freq: Two times a day (BID) | CUTANEOUS | 0 refills | Status: DC

## 2022-11-17 ENCOUNTER — Other Ambulatory Visit: Payer: Self-pay | Admitting: Podiatry

## 2022-11-21 ENCOUNTER — Encounter: Payer: Self-pay | Admitting: Internal Medicine

## 2022-11-21 DIAGNOSIS — E11319 Type 2 diabetes mellitus with unspecified diabetic retinopathy without macular edema: Secondary | ICD-10-CM

## 2022-11-21 MED ORDER — NOVOLOG FLEXPEN 100 UNIT/ML ~~LOC~~ SOPN
2.0000 [IU] | PEN_INJECTOR | Freq: Three times a day (TID) | SUBCUTANEOUS | 1 refills | Status: DC
Start: 2022-11-21 — End: 2023-09-20

## 2022-12-04 ENCOUNTER — Encounter: Payer: Self-pay | Admitting: Family Medicine

## 2022-12-14 ENCOUNTER — Encounter: Payer: Self-pay | Admitting: Internal Medicine

## 2022-12-14 ENCOUNTER — Ambulatory Visit: Payer: PPO | Admitting: Internal Medicine

## 2022-12-14 VITALS — BP 110/62 | HR 75 | Ht 63.0 in | Wt 160.8 lb

## 2022-12-14 DIAGNOSIS — E1142 Type 2 diabetes mellitus with diabetic polyneuropathy: Secondary | ICD-10-CM | POA: Diagnosis not present

## 2022-12-14 DIAGNOSIS — E11319 Type 2 diabetes mellitus with unspecified diabetic retinopathy without macular edema: Secondary | ICD-10-CM | POA: Diagnosis not present

## 2022-12-14 DIAGNOSIS — E785 Hyperlipidemia, unspecified: Secondary | ICD-10-CM

## 2022-12-14 DIAGNOSIS — Z7984 Long term (current) use of oral hypoglycemic drugs: Secondary | ICD-10-CM | POA: Diagnosis not present

## 2022-12-14 DIAGNOSIS — Z7985 Long-term (current) use of injectable non-insulin antidiabetic drugs: Secondary | ICD-10-CM | POA: Diagnosis not present

## 2022-12-14 DIAGNOSIS — Z794 Long term (current) use of insulin: Secondary | ICD-10-CM

## 2022-12-14 LAB — HEMOGLOBIN A1C: Hemoglobin A1C: 6.8

## 2022-12-14 NOTE — Progress Notes (Signed)
Patient ID: Priscilla [name], female   DOB: [birth year], 72 y.o.   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 She previously saw Drs Lucianne Muss (distant past) and [place] (more recently). Last visit with me 4 months ago.  Interim history: She continues to have a lot of stress at home due to her husband who has dementia.  She has urinary incontinence, no blurry vision, chest pain.  She also has tremors.  She has cervical dystonia. No nausea, diarrhea, constipation, after starting Ozempic.  Reviewed HbA1c levels: Lab Results  Component Value Date   HGBA1C 7.2 (A) 08/17/2022   HGBA1C 7.0 (A) 04/18/2022   HGBA1C 6.9 (A) 12/14/2021   HGBA1C 7.0 (A) 08/09/2021   HGBA1C 6.6 (A) 03/07/2021   HGBA1C 6.7 (A) 10/29/2020   HGBA1C 6.2 (A) 06/07/2020   HGBA1C 6.3 (A) 12/30/2019   HGBA1C 6.3 (A) 08/25/2019   HGBA1C 5.9 (A) 04/24/2019   HGBA1C 6.0 (A) 02/13/2019   HGBA1C 6.1 (A) 09/20/2018   HGBA1C 6.4 (A) 05/23/2018   HGBA1C 6.0 (A) 10/17/2017   HGBA1C 6.3 06/13/2017  03/16/2017: HbA1c calculated from fructosamine: 5.6% 10/18/2015: HbA1c calculated from fructosamine: 5.7% 02/2015: HbA1c 7.4%, HbA1c calculated from fructosamine: 5.6%  She is on: - Metformin 1000 mg 2x a day with meals - Levemir 30 >> .Marland Kitchen. 15 >> 13 >> 10-13 >> 13-15 >> 15 units at bedtime >> Lantus 15  in am - Mealtime Novolog:  (3) 4-5 >> 4-6 before meals (mostly uses 4 units) >>  4-6 units 15 minutes before meals >> 4 units rarely - Ozempic 0.25 >> 0.5 mg weekly-started 08/2022 She was on Victoza 2.5 mg daily >> stopped as she could not afford this.  Pt.checks her sugars more than 4 times a day with her CGM (with receiver):  Previously:  Previously:   Lowest sugar was 42 >> ...  70 >> 50s; she has hypoglycemia awareness in the in the 70s. Highest sugar was 300 (steroids) >>  ... 265 >> <200  Glucometer: Freestyle  -No CKD; last BUN/creatinine:  Lab Results  Component Value Date   BUN 25 (H) 04/18/2022   CREATININE 0.79 04/18/2022   Lab Results  Component Value Date   MICRALBCREAT 1.0 04/18/2022   MICRALBCREAT 0.9 05/06/2021   MICRALBCREAT 0.2 09/26/2013  ACR (12/2014): 6.9 On lisinopril 1.25 mg daily  -+ HL; last set of lipids: Lab Results  Component Value Date   CHOL 142 04/18/2022   HDL 64.70 04/18/2022   LDLCALC 64 04/18/2022   TRIG 67.0 04/18/2022   CHOLHDL 2 04/18/2022  On pravastatin 40.  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  She has floaters.  -+ numbness, tingling, burning in her feet - stable.  On B12.  I recommended alpha lipoic acid but she did not take this due to the large size of the pill.  Previously on Neurontin 100 mg daily per podiatry, now seldom, 2/2 being also on Ambien.  Last foot exam 08/17/2022.  Latest TSH normal: Lab Results  Component Value Date   TSH 2.47 07/06/2022   She has a history of lap band surgery >> she does not usually eat large meals but she usually grazes, especially at night She has neck pain and dizziness and was diagnosed with possible cervical dystonia.  She sees neurology. She has hip pain >> had steroid inj's.  No personal history of pancreatitis or family history of medullary thyroid cancer.  ROS: + See HPI  I reviewed pt's medications, allergies, PMH, social hx, family hx, and changes were documented in the history of present illness. Otherwise, unchanged from my initial visit note.  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 Alvester Morin   PTCA     WRIST SURGERY     LEFT   Social History   Social History   Marital Status: Married    Spouse Name: N/A   Number of Children: N/A   Occupational History   Not on file.   Social History Main Topics   Smoking status: Never Smoker    Smokeless tobacco: Never Used   Alcohol Use: Yes     Comment: glass of wine-specially occasion   Drug Use: No   Current Outpatient Medications on File Prior to Visit  Medication Sig Dispense Refill   aspirin 81 MG tablet Take 81 mg by mouth daily.     atorvastatin (LIPITOR) 40 MG tablet Take 1 tablet (40 mg total) by mouth daily. 30 tablet 11   baclofen (LIORESAL) 10 MG tablet Take 0.5-1 tablets (5-10 mg total) by mouth in the morning and at bedtime. 60 each 5   BD PEN NEEDLE NANO 2ND GEN 32G X 4 MM MISC USE AS DIRECTED THREE TIMES DAILY FOR INSULIN (Patient not taking: Reported on 11/14/2022) 300 each 3   calcium-vitamin D 250-100 MG-UNIT per tablet Take 1 tablet by mouth daily.      clobetasol (TEMOVATE) 0.05 % external solution Apply 1 Application topically 2 (two) times daily. 50 mL 0   COLLAGEN PO Take by mouth. Adds to tea daily     Continuous Blood Gluc Receiver (FREESTYLE LIBRE 2 READER) DEVI Use as instructed to check blood sugar 4X daily 1 each 0   Continuous Blood Gluc Sensor (FREESTYLE LIBRE 2 SENSOR) MISC Use  sensors with CGM to check sugars. 6 each 3   Cyanocobalamin (VITAMIN B12 PO) Place under the tongue.     DULoxetine (CYMBALTA) 60 MG capsule Take 1 capsule (60 mg total) by mouth daily. 30 capsule 5   fluticasone (FLONASE) 50 MCG/ACT nasal spray Place 2 sprays into both nostrils in the morning and at bedtime. 16 g 6   glucose blood (ONETOUCH VERIO) test strip USE TO TEST BLOOD SUGAR TWICE A DAY DX: E11.319 150 each 3   insulin glargine (LANTUS SOLOSTAR) 100 UNIT/ML Solostar Pen  Inject 15 Units into the skin daily. 15 mL 11   Insulin Syringe-Needle U-100 (BD INSULIN SYRINGE U/F) 31G X 5/16" 0.5 ML MISC Use 1x a day for insulin (Patient not taking: Reported on 11/14/2022) 100 each 3   ketoconazole (NIZORAL) 2 % cream APPLY TOPICALLY TO THE AFFECTED AREA TWICE DAILY 60 g 3   lisinopril (ZESTRIL) 10 MG tablet Take 1 tablet (10 mg total) by mouth daily. 90 tablet 1   meloxicam (MOBIC) 15 MG tablet TAKE 1 TABLET(15 MG) BY MOUTH DAILY 90 tablet 3   metFORMIN (GLUCOPHAGE) 1000 MG tablet Take 1 tablet (1,000 mg total) by mouth 2 (two) times daily with a meal. 180 tablet 2   metoprolol tartrate (LOPRESSOR) 25 MG tablet TAKE 1 TABLET(25 MG) BY MOUTH TWICE DAILY 60 tablet 5   NOVOLOG FLEXPEN 100 UNIT/ML FlexPen Inject 2-6 Units into the skin 3 (three) times daily with meals. 30 mL 1   pregabalin (LYRICA) 75 MG capsule Take 1 capsule (75 mg total) by mouth 2 (two) times daily. 60 capsule 5   Semaglutide,0.25 or 0.5MG /DOS, 2 MG/3ML SOPN Inject 0.5 mg into the skin once a week. 9 mL 3   sertraline (ZOLOFT) 100 MG tablet Take 1 tablet (100 mg total) by mouth daily. 30 tablet 5   Vitamin D, Ergocalciferol, (DRISDOL) 1.25 MG (50000 UNIT) CAPS capsule TAKE 1 CAPSULE BY MOUTH EVERY 7 DAYS 4 capsule 6   zolpidem (AMBIEN) 5 MG tablet TAKE 1 TABLET BY MOUTH AT BEDTIME FOR INSOMNIA (Patient not taking: Reported on 11/14/2022) 30 tablet 0   No current facility-administered medications on file prior to visit.   No Known  Allergies Family History  Problem Relation Age of Onset   Arthritis Mother    Alzheimer's disease Mother    Stroke Father    Alcohol abuse Father    Diabetes Father    Heart disease Father    Early death Father 42   Diabetes Sister    Arthritis Sister    COPD Sister    Arthritis Maternal Aunt    Diabetes Maternal Aunt    Early death Maternal Aunt 73   Diabetes Maternal Uncle    Cancer Paternal Aunt    COPD Paternal Aunt    Heart disease Paternal Aunt    Heart disease Paternal Uncle    Alcohol abuse Paternal Grandfather    Heart disease Paternal Grandfather    Early death Paternal Grandfather 41   Stroke Paternal Grandfather    Hypertension Son    Autoimmune disease Daughter    Breast cancer Neg Hx    PE: BP 110/62   Pulse 75   Ht 5\' 3"  (1.6 m)   Wt 160 lb 12.8 oz (72.9 kg)   LMP  (LMP Unknown)   SpO2 95%   BMI 28.48 kg/m   Wt Readings from Last 3 Encounters:  12/14/22 160 lb 12.8 oz (72.9 kg)  11/14/22 159 lb 6.4 oz (72.3 kg)  08/17/22 162 lb 6.4 oz (73.7 kg)   Constitutional: Normal weight, in NAD Eyes: EOMI, no exophthalmos ENT: no thyromegaly, no cervical lymphadenopathy Cardiovascular: RRR, No MRG Respiratory: CTA B Musculoskeletal: no deformities Skin: no rashes Neurological: + tremor with outstretched hands  ASSESSMENT: 1. DM2, insulin-dependent, uncontrolled, with complications - Diabetic retinopathy left eye - resolved - Peripheral neuropathy  Component     Latest Ref Rng & Units 04/24/2019  Hemoglobin A1C     4.0 - 5.6 % 5.9 (A)  Islet Cell Ab  Neg:<1:1 Negative  ZNT8 Antibodies     U/mL <15  C-Peptide     0.80 - 3.85 ng/mL 2.35  Glucose, Plasma     65 - 99 mg/dL 782 (H)  Glutamic Acid Decarb Ab     <5 IU/mL <5  No insulin deficiency or pancreatic autoimmunity.  2. HL  3. PN   PLAN:  1. Patient with longstanding, uncontrolled, type 2 diabetes, on metformin, basal/bolus insulin regimen, to which we added a GLP-1 receptor agonist  at last visit.  At that time, sugars were fluctuating mostly within the target range but she had hyperglycemic spikes after breakfast and dinner.  She was not taking the recommended doses of NovoLog before larger meals and she was occasionally forgetting the insulin completely.  She inquired about Ozempic and we discussed about possible side effects and also about benefits.  We decided to try to start this.  I did advise her to decrease the doses of insulin if sugars improved after adding Ozempic. CGM interpretation: -At today's visit, we reviewed her CGM downloads: It appears that 96% of values are in target range (goal >70%), while 3% are higher than 180 (goal <25%), and 1% are lower than 70 (goal <4%).  The calculated average blood sugar is 113.  The projected HbA1c for the next 3 months (GMI) is 6.0%. -Reviewing the CGM trends, sugars are fluctuating almost exclusively within the target range with occasional lower numbers in the afternoon and also around 1 AM.  Upon questioning, she is using NovoLog only rarely, as sugars are still well-controlled after meals.  We discussed about the fact that she may only use this for a large meal, for example a holiday meal, but definitely does not need to take this consistently.  Moreover, due to the low blood sugars that are still persistent even after reducing the NovoLog doses, I advised her to decrease the Lantus dose.  I did advise her that if the sugars remain low, she may need to reduce them even more. - I advised her to:  Patient Instructions  Please continue: - Metformin 1000 mg 2x a day with meals - Ozempic 0.5 mg weekly  Use: - Novolog 4-6 units only before a large meal  Decrease: - Lantus to 12 units daily  Please come back for a follow-up appointment in 4 months.  - we checked her HbA1c: 6.8% (lower) - advised to check sugars at different times of the day - 4x a day, rotating check times - advised for yearly eye exams >> she is UTD - return  to clinic in 4 months   2. HL -Reviewed latest lipid panel from 04/2022: Fractions at goal: Lab Results  Component Value Date   CHOL 142 04/18/2022   HDL 64.70 04/18/2022   LDLCALC 64 04/18/2022   TRIG 67.0 04/18/2022   CHOLHDL 2 04/18/2022  -She is on pravastatin 40 mg daily without side effects  3. PN -Due to diabetes -Able -I previously suggested alpha lipoic acid but she could not take it due to the large tablet size -She is on B12 vitamin, Cymbalta, and gabapentin-prescribed by podiatry.  These are helping.  Carlus Pavlov, MD PhD [facility] Endocrinology

## 2022-12-14 NOTE — Patient Instructions (Addendum)
Please continue: - Metformin 1000 mg 2x a day with meals - Ozempic 0.5 mg weekly  Use: - Novolog 4-6 units only before a large meal  Decrease: - Lantus to 12 units daily  Please come back for a follow-up appointment in 4 months.

## 2022-12-19 ENCOUNTER — Other Ambulatory Visit: Payer: Self-pay | Admitting: Orthopaedic Surgery

## 2022-12-20 DIAGNOSIS — H532 Diplopia: Secondary | ICD-10-CM | POA: Diagnosis not present

## 2022-12-20 DIAGNOSIS — E113293 Type 2 diabetes mellitus with mild nonproliferative diabetic retinopathy without macular edema, bilateral: Secondary | ICD-10-CM | POA: Diagnosis not present

## 2022-12-20 DIAGNOSIS — H43813 Vitreous degeneration, bilateral: Secondary | ICD-10-CM | POA: Diagnosis not present

## 2022-12-20 DIAGNOSIS — H52223 Regular astigmatism, bilateral: Secondary | ICD-10-CM | POA: Diagnosis not present

## 2022-12-20 DIAGNOSIS — H25813 Combined forms of age-related cataract, bilateral: Secondary | ICD-10-CM | POA: Diagnosis not present

## 2022-12-20 DIAGNOSIS — H40022 Open angle with borderline findings, high risk, left eye: Secondary | ICD-10-CM | POA: Diagnosis not present

## 2022-12-20 DIAGNOSIS — H524 Presbyopia: Secondary | ICD-10-CM | POA: Diagnosis not present

## 2022-12-20 DIAGNOSIS — H5203 Hypermetropia, bilateral: Secondary | ICD-10-CM | POA: Diagnosis not present

## 2022-12-20 LAB — HM DIABETES EYE EXAM

## 2022-12-25 ENCOUNTER — Ambulatory Visit (INDEPENDENT_AMBULATORY_CARE_PROVIDER_SITE_OTHER): Payer: PPO

## 2022-12-25 ENCOUNTER — Encounter: Payer: Self-pay | Admitting: Family Medicine

## 2022-12-25 DIAGNOSIS — Z23 Encounter for immunization: Secondary | ICD-10-CM

## 2022-12-25 NOTE — Progress Notes (Signed)
Pt can for flu vaccine today. Pt tolerated injection well

## 2023-01-01 ENCOUNTER — Other Ambulatory Visit: Payer: Self-pay

## 2023-01-01 MED ORDER — SERTRALINE HCL 100 MG PO TABS: 100.0000 mg | ORAL_TABLET | Freq: Every day | ORAL | 0 refills | Status: DC

## 2023-02-07 ENCOUNTER — Other Ambulatory Visit: Payer: Self-pay

## 2023-02-07 MED ORDER — DULOXETINE HCL 60 MG PO CPEP: 60.0000 mg | ORAL_CAPSULE | Freq: Every day | ORAL | 0 refills | Status: DC

## 2023-02-08 ENCOUNTER — Other Ambulatory Visit: Payer: Self-pay

## 2023-02-08 MED ORDER — METOPROLOL TARTRATE 25 MG PO TABS: ORAL_TABLET | ORAL | 0 refills | Status: DC

## 2023-02-19 ENCOUNTER — Encounter: Payer: Self-pay | Admitting: Pharmacist

## 2023-02-19 ENCOUNTER — Other Ambulatory Visit: Payer: Self-pay

## 2023-02-19 MED ORDER — LISINOPRIL 10 MG PO TABS: 10.0000 mg | ORAL_TABLET | Freq: Every day | ORAL | 0 refills | Status: DC

## 2023-02-19 NOTE — Progress Notes (Signed)
Pharmacy Quality Measure Review  This patient is appearing on a report for being at risk of failing the adherence measure for cholesterol (statin) medications this calendar year.   Medication: atorvastatin  Last fill date: 10/01/2022 for 90 day supply  Reviewed Dr Tiajuana Amass database - actual last refill was 12/30/2022 for #90 tablets.  Adherence for 2024 per refill records will be 94%  Insurance report was not up to date. No action needed at this time.   Henrene Pastor, PharmD Clinical Pharmacist [facility]  Population Health [phone]

## 2023-03-06 ENCOUNTER — Other Ambulatory Visit: Payer: Self-pay | Admitting: Physical Medicine and Rehabilitation

## 2023-03-06 ENCOUNTER — Encounter: Payer: Self-pay | Admitting: Physical Medicine and Rehabilitation

## 2023-03-06 DIAGNOSIS — M25551 Pain in right hip: Secondary | ICD-10-CM

## 2023-03-12 ENCOUNTER — Encounter: Payer: Self-pay | Admitting: Family Medicine

## 2023-03-12 NOTE — Telephone Encounter (Signed)
See note in pts chart.

## 2023-03-21 ENCOUNTER — Ambulatory Visit (INDEPENDENT_AMBULATORY_CARE_PROVIDER_SITE_OTHER): Payer: PPO | Admitting: *Deleted

## 2023-03-21 DIAGNOSIS — Z0000 Encounter for general adult medical examination without abnormal findings: Secondary | ICD-10-CM

## 2023-03-21 NOTE — Patient Instructions (Signed)
Priscilla [name] , Thank you for taking time to come for your Medicare Wellness Visit. I appreciate your ongoing commitment to your health goals. Please review the following plan we discussed and let me know if I can assist you in the future.   Screening recommendations/referrals: Colonoscopy:  Mammogram:  Bone Density:  Recommended yearly ophthalmology/optometry visit for glaucoma screening and checkup Recommended yearly dental visit for hygiene and checkup  Vaccinations: Influenza vaccine:  Pneumococcal vaccine:  Tdap vaccine:  Shingles vaccine:        Preventive Care 65 Years and Older, Female Preventive care refers to lifestyle choices and visits with your health care provider that can promote health and wellness. What does preventive care include? A yearly physical exam. This is also called an annual well check. Dental exams once or twice a year. Routine eye exams. Ask your health care provider how often you should have your eyes checked. Personal lifestyle choices, including: Daily care of your teeth and gums. Regular physical activity. Eating a healthy diet. Avoiding tobacco and drug use. Limiting alcohol use. Practicing safe sex. Taking low-dose aspirin every day. Taking vitamin and mineral supplements as recommended by your health care provider. What happens during an annual well check? The services and screenings done by your health care provider during your annual well check will depend on your age, overall health, lifestyle risk factors, and family history of disease. Counseling  Your health care provider may ask you questions about your: Alcohol use. Tobacco use. Drug use. Emotional well-being. Home and relationship well-being. Sexual activity. Eating habits. History of falls. Memory and ability to understand (cognition). Work and work Astronomer. Reproductive health. Screening  You may have the following tests or measurements: Height, weight, and  BMI. Blood pressure. Lipid and cholesterol levels. These may be checked every 5 years, or more frequently if you are over 35 years old. Skin check. Lung cancer screening. You may have this screening every year starting at age 27 if you have a 30-pack-year history of smoking and currently smoke or have quit within the past 15 years. Fecal occult blood test (FOBT) of the stool. You may have this test every year starting at age 55. Flexible sigmoidoscopy or colonoscopy. You may have a sigmoidoscopy every 5 years or a colonoscopy every 10 years starting at age 72. Hepatitis C blood test. Hepatitis B blood test. Sexually transmitted disease (STD) testing. Diabetes screening. This is done by checking your blood sugar (glucose) after you have not eaten for a while (fasting). You may have this done every 1-3 years. Bone density scan. This is done to screen for osteoporosis. You may have this done starting at age 72. Mammogram. This may be done every 1-2 years. Talk to your health care provider about how often you should have regular mammograms. Talk with your health care provider about your test results, treatment options, and if necessary, the need for more tests. Vaccines  Your health care provider may recommend certain vaccines, such as: Influenza vaccine. This is recommended every year. Tetanus, diphtheria, and acellular pertussis (Tdap, Td) vaccine. You may need a Td booster every 10 years. Zoster vaccine. You may need this after age 15. Pneumococcal 13-valent conjugate (PCV13) vaccine. One dose is recommended after age 58. Pneumococcal polysaccharide (PPSV23) vaccine. One dose is recommended after age 65. Talk to your health care provider about which screenings and vaccines you need and how often you need them. This information is not intended to replace advice given to you by your health  care provider. Make sure you discuss any questions you have with your health care provider. Document  Released: 04/23/2015 Document Revised: 12/15/2015 Document Reviewed: 01/26/2015 Elsevier Interactive Patient Education  2017 [employer].  Fall Prevention in the Home Falls can cause injuries. They can happen to people of all ages. There are many things you can do to make your home safe and to help prevent falls. What can I do on the outside of my home? Regularly fix the edges of walkways and driveways and fix any cracks. Remove anything that might make you trip as you walk through a door, such as a raised step or threshold. Trim any bushes or trees on the path to your home. Use bright outdoor lighting. Clear any walking paths of anything that might make someone trip, such as rocks or tools. Regularly check to see if handrails are loose or broken. Make sure that both sides of any steps have handrails. Any raised decks and porches should have guardrails on the edges. Have any leaves, snow, or ice cleared regularly. Use sand or salt on walking paths during winter. Clean up any spills in your garage right away. This includes oil or grease spills. What can I do in the bathroom? Use night lights. Install grab bars by the toilet and in the tub and shower. Do not use towel bars as grab bars. Use non-skid mats or decals in the tub or shower. If you need to sit down in the shower, use a plastic, non-slip stool. Keep the floor dry. Clean up any water that spills on the floor as soon as it happens. Remove soap buildup in the tub or shower regularly. Attach bath mats securely with double-sided non-slip rug tape. Do not have throw rugs and other things on the floor that can make you trip. What can I do in the bedroom? Use night lights. Make sure that you have a light by your bed that is easy to reach. Do not use any sheets or blankets that are too big for your bed. They should not hang down onto the floor. Have a firm chair that has side arms. You can use this for support while you get dressed. Do  not have throw rugs and other things on the floor that can make you trip. What can I do in the kitchen? Clean up any spills right away. Avoid walking on wet floors. Keep items that you use a lot in easy-to-reach places. If you need to reach something above you, use a strong step stool that has a grab bar. Keep electrical cords out of the way. Do not use floor polish or wax that makes floors slippery. If you must use wax, use non-skid floor wax. Do not have throw rugs and other things on the floor that can make you trip. What can I do with my stairs? Do not leave any items on the stairs. Make sure that there are handrails on both sides of the stairs and use them. Fix handrails that are broken or loose. Make sure that handrails are as long as the stairways. Check any carpeting to make sure that it is firmly attached to the stairs. Fix any carpet that is loose or worn. Avoid having throw rugs at the top or bottom of the stairs. If you do have throw rugs, attach them to the floor with carpet tape. Make sure that you have a light switch at the top of the stairs and the bottom of the stairs. If you do not  have them, ask someone to add them for you. What else can I do to help prevent falls? Wear shoes that: Do not have high heels. Have rubber bottoms. Are comfortable and fit you well. Are closed at the toe. Do not wear sandals. If you use a stepladder: Make sure that it is fully opened. Do not climb a closed stepladder. Make sure that both sides of the stepladder are locked into place. Ask someone to hold it for you, if possible. Clearly mark and make sure that you can see: Any grab bars or handrails. First and last steps. Where the edge of each step is. Use tools that help you move around (mobility aids) if they are needed. These include: Canes. Walkers. Scooters. Crutches. Turn on the lights when you go into a dark area. Replace any light bulbs as soon as they burn out. Set up your  furniture so you have a clear path. Avoid moving your furniture around. If any of your floors are uneven, fix them. If there are any pets around you, be aware of where they are. Review your medicines with your doctor. Some medicines can make you feel dizzy. This can increase your chance of falling. Ask your doctor what other things that you can do to help prevent falls. This information is not intended to replace advice given to you by your health care provider. Make sure you discuss any questions you have with your health care provider. Document Released: 01/21/2009 Document Revised: 09/02/2015 Document Reviewed: 05/01/2014 Elsevier Interactive Patient Education  2017 [employer].

## 2023-03-21 NOTE — Progress Notes (Signed)
Subjective:   Priscilla [name] is a 72 y.o. female who presents for Medicare Annual (Subsequent) preventive examination.  Visit Complete: Virtual I connected with  Vergie Living on 03/21/23 by a audio enabled telemedicine application and verified that I am speaking with the correct person using two identifiers.  Patient Location: Home  Provider Location: Home Office  I discussed the limitations of evaluation and management by telemedicine. The patient expressed understanding and agreed to proceed.  Vital Signs: Because this visit was a virtual/telehealth visit, some criteria may be missing or patient reported. Any vitals not documented were not able to be obtained and vitals that have been documented are patient reported  Cardiac Risk Factors include: advanced age (>74men, >23 women);diabetes mellitus;family history of premature cardiovascular disease     Objective:    There were no vitals filed for this visit. There is no height or weight on file to calculate BMI.     03/21/2023    2:30 PM 03/15/2022    3:03 PM 11/30/2021    9:13 AM 08/25/2021    9:21 AM 06/16/2021    9:33 AM 05/04/2021   10:42 AM 02/23/2021    8:20 AM  Advanced Directives  Does Patient Have a Medical Advance Directive? Yes No No No Yes Yes Yes  Type of Psychiatrist of [place];Living will Living will   Does patient want to make changes to medical advance directive?     No - Patient declined  Yes (MAU/Ambulatory/Procedural Areas - Information given)  Copy of Healthcare Power of Attorney in Chart? No - copy requested        Would patient like information on creating a medical advance directive?  No - Patient declined         Current Medications (verified) Outpatient Encounter Medications as of 03/21/2023  Medication Sig   aspirin 81 MG tablet Take 81 mg by mouth daily.   atorvastatin (LIPITOR) 40 MG tablet Take 1 tablet (40 mg total) by mouth daily.    baclofen (LIORESAL) 10 MG tablet Take 0.5-1 tablets (5-10 mg total) by mouth in the morning and at bedtime.   BD PEN NEEDLE NANO 2ND GEN 32G X 4 MM MISC USE AS DIRECTED THREE TIMES DAILY FOR INSULIN   calcium-vitamin D 250-100 MG-UNIT per tablet Take 1 tablet by mouth daily.    clobetasol (TEMOVATE) 0.05 % external solution Apply 1 Application topically 2 (two) times daily.   COLLAGEN PO Take by mouth. Adds to tea daily   Continuous Blood Gluc Receiver (FREESTYLE LIBRE 2 READER) DEVI Use as instructed to check blood sugar 4X daily   Continuous Blood Gluc Sensor (FREESTYLE LIBRE 2 SENSOR) MISC Use sensors with CGM to check sugars.   Cyanocobalamin (VITAMIN B12 PO) Place under the tongue.   DULoxetine (CYMBALTA) 60 MG capsule Take 1 capsule (60 mg total) by mouth daily.   fluticasone (FLONASE) 50 MCG/ACT nasal spray Place 2 sprays into both nostrils in the morning and at bedtime.   glucose blood (ONETOUCH VERIO) test strip USE TO TEST BLOOD SUGAR TWICE A DAY DX: E11.319   insulin glargine (LANTUS SOLOSTAR) 100 UNIT/ML Solostar Pen Inject 15 Units into the skin daily.   Insulin Syringe-Needle U-100 (BD INSULIN SYRINGE U/F) 31G X 5/16" 0.5 ML MISC Use 1x a day for insulin   ketoconazole (NIZORAL) 2 % cream APPLY TOPICALLY TO THE AFFECTED AREA TWICE DAILY   lisinopril (ZESTRIL) 10 MG tablet Take  1 tablet (10 mg total) by mouth daily.   meloxicam (MOBIC) 15 MG tablet TAKE 1 TABLET(15 MG) BY MOUTH DAILY   metFORMIN (GLUCOPHAGE) 1000 MG tablet Take 1 tablet (1,000 mg total) by mouth 2 (two) times daily with a meal.   metoprolol tartrate (LOPRESSOR) 25 MG tablet TAKE 1 TABLET(25 MG) BY MOUTH TWICE DAILY. OFFICE VISIT NEEDED FOR FURTHER REFILLS   NOVOLOG FLEXPEN 100 UNIT/ML FlexPen Inject 2-6 Units into the skin 3 (three) times daily with meals.   pregabalin (LYRICA) 75 MG capsule Take 1 capsule (75 mg total) by mouth 2 (two) times daily.   Semaglutide,0.25 or 0.5MG /DOS, 2 MG/3ML SOPN Inject 0.5 mg into  the skin once a week.   sertraline (ZOLOFT) 100 MG tablet Take 1 tablet (100 mg total) by mouth daily.   Vitamin D, Ergocalciferol, (DRISDOL) 1.25 MG (50000 UNIT) CAPS capsule TAKE 1 CAPSULE BY MOUTH EVERY 7 DAYS   zolpidem (AMBIEN) 5 MG tablet TAKE 1 TABLET BY MOUTH AT BEDTIME FOR INSOMNIA   No facility-administered encounter medications on file as of 03/21/2023.    Allergies (verified) Patient has no known allergies.   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 Alvester Morin   PTCA     WRIST SURGERY     LEFT   Family History  Problem Relation Age of Onset   Arthritis Mother    Alzheimer's disease Mother    Stroke Father    Alcohol abuse Father    Diabetes Father    Heart disease Father    Early death Father 45   Diabetes Sister    Arthritis Sister    COPD Sister    Arthritis Maternal Aunt    Diabetes Maternal Aunt    Early death Maternal Aunt 50   Diabetes Maternal Uncle    Cancer Paternal Aunt    COPD Paternal Aunt    Heart disease Paternal Aunt    Heart disease  Paternal Uncle    Alcohol abuse Paternal Grandfather    Heart disease Paternal Grandfather    Early death Paternal Grandfather 77   Stroke Paternal Grandfather    Hypertension Son    Autoimmune disease Daughter    Breast cancer Neg Hx    Social History   Socioeconomic History   Marital status: Married    Spouse name: Not on file   Number of children: Not on file   Years of education: Not on file   Highest education level: Not on file  Occupational History   Occupation: retired  Tobacco Use   Smoking status: Never    Passive exposure: Past   Smokeless tobacco: Never  Vaping Use   Vaping status: Never Used  Substance and Sexual Activity   Alcohol use: Yes    Comment: glass of wine-special occasion   Drug use: Never   Sexual activity: Never    Birth control/protection: None  Other Topics Concern   Not on file  Social History Narrative   Married, Brett Canales. 2 children name Genevie Cheshire and Victorino Dike.   Some college, retired Comptroller.   Drinks caffeine.   Take a daily vitamin.   Wears her seatbelt, exercises 3 times a week.   Smoke detector in the home.   Firearms in the home.   Feels safe in her relationships.   Right handed   Social Determinants of Health   Financial Resource Strain: Low Risk  (03/21/2023)   Overall Financial Resource Strain (CARDIA)    Difficulty of Paying Living Expenses: Not hard at all  Food Insecurity: No Food Insecurity (03/21/2023)   Hunger Vital Sign    Worried About Running Out of Food in the Last Year: Never true    Ran Out of Food in the Last Year: Never true  Transportation Needs: No Transportation Needs (03/21/2023)   PRAPARE - Administrator, Civil Service (Medical): No    Lack of Transportation (Non-Medical): No  Physical Activity: Inactive (03/21/2023)   Exercise Vital Sign    Days of Exercise per Week: 0 days    Minutes of Exercise per Session: 0 min  Stress: Stress Concern Present (03/21/2023)   [employer] of Occupational Health - Occupational Stress Questionnaire    Feeling of Stress : Very much  Social Connections: Moderately Isolated (03/21/2023)   Social Connection and Isolation Panel [NHANES]    Frequency of Communication with Friends and Family: More than three times a week    Frequency of Social Gatherings with Friends and Family: More than three times a week    Attends Religious Services: Never    Database administrator or Organizations: No    Attends Engineer, structural: Never    Marital Status: Married    Tobacco Counseling Counseling given: Not Answered   Clinical Intake:  Pre-visit preparation completed: Yes  Pain : No/denies pain     Diabetes: Yes CBG done?: No Did pt. bring in CBG monitor from home?: No  How often do you need to have someone help you when you read instructions, pamphlets, or other written materials from your doctor or pharmacy?: 1 - Never  Interpreter Needed?: No  Information entered by :: Remi Haggard LPN   Activities of Daily Living    03/21/2023    2:33 PM  In your present state of health, do you have any difficulty performing the following activities:  Hearing? 0  Vision? 0  Difficulty concentrating or making decisions? 1  Walking or climbing stairs? 1  Dressing or bathing? 0  Doing errands, shopping? 0  Preparing Food and eating ? N  Using the Toilet? N  In the past six months, have you accidently leaked urine? Y  Do you have problems with loss of bowel control? N  Managing your Medications? N  Managing your Finances? N  Housekeeping or managing your Housekeeping? N    Patient Care Team: Natalia Leatherwood, DO as PCP - General (Family Medicine) August Saucer, Corrie Mckusick, MD as Consulting Physician (Orthopedic Surgery) Carlus Pavlov, MD as Consulting Physician (Internal Medicine)  Kathryne Hitch, MD as Consulting Physician (Orthopedic Surgery) Pollyann Savoy, MD as Consulting Physician  (Rheumatology) Josph Macho, MD as Consulting Physician (Oncology) Tyrell Antonio, MD as Consulting Physician (Physical Medicine and Rehabilitation) Dermatology, [facility], [place], [place] as Consulting Physician (Podiatry) Tat, Octaviano Batty, DO as Consulting Physician (Neurology)  Indicate any recent Medical Services you may have received from other than [facility] providers in the past year (date may be approximate).     Assessment:   This is a routine wellness examination for [place].  Hearing/Vision screen Hearing Screening - Comments:: No trouble hearing Vision Screening - Comments:: Up to date [place]   Goals Addressed             This Visit's Progress    Become More Active   On track     Notes: drink more water     Patient Stated   Not on track    Increase social activities and drink more water.      Patient Stated       Continue current lifestyle       Depression Screen    03/21/2023    2:36 PM 03/15/2022    3:02 PM 10/21/2021    9:31 AM 08/15/2021   10:08 AM 02/23/2021    8:18 AM 10/15/2020    2:38 PM 01/14/2020    9:53 AM  PHQ 2/9 Scores  PHQ - 2 Score 3 0 2 2 1 2 3   PHQ- 9 Score 7  5   8 5   Exception Documentation  Medical reason         Fall Risk    03/21/2023    2:29 PM 03/15/2022    3:03 PM 11/30/2021    9:13 AM 08/25/2021    9:21 AM 05/04/2021   10:42 AM  Fall Risk   Falls in the past year? 0 0 0 0 1  Number falls in past yr: 0 0 0 0 0  Injury with Fall? 0 0 0 0 0  Risk for fall due to :  No Fall Risks     Follow up Falls evaluation completed;Education provided;Falls prevention discussed Falls evaluation completed       MEDICARE RISK AT HOME: Medicare Risk at Home Any stairs in or around the home?: Yes If so, are there any without handrails?: No Home free of loose throw rugs in walkways, pet beds, electrical cords, etc?: Yes Adequate lighting in your home to reduce risk of falls?: Yes Life alert?: No Use of a cane, walker or w/c?: Yes Grab  bars in the bathroom?: No Shower chair or bench in shower?: Yes Elevated toilet seat or a handicapped toilet?: No  TIMED UP AND GO:  Was the test performed?  No    Cognitive Function:    12/03/2018    3:25 PM 11/27/2017    9:29 AM  MMSE - Mini Mental State Exam  Orientation to time 5 5  Orientation to Place 5 5  Registration 3 3  Attention/ Calculation 5 5  Recall 3 3  Language- name 2 objects 2 2  Language- repeat 1 1  Language- follow 3 step command 3 3  Language- read & follow direction 1 1  Write a sentence 1 1  Copy design 1 1  Total score 30 30        03/21/2023    2:34 PM 03/15/2022    3:04 PM 02/23/2021    8:23 AM  6CIT Screen  What Year? 0 points  0 points 0 points  What month? 0 points 0 points 0 points  What time? 0 points 0 points 0 points  Count back from 20 2 points 0 points 0 points  Months in reverse 2 points 2 points 0 points  Repeat phrase 2 points 0 points 0 points  Total Score 6 points 2 points 0 points    Immunizations Immunization History  Administered Date(s) Administered   Fluad Quad(high Dose 65+) 12/03/2018, 01/29/2020, 03/24/2022   Fluad Trivalent(High Dose 65+) 12/25/2022   Influenza Split 01/21/2013   Influenza, High Dose Seasonal PF 02/04/2015, 02/09/2016, 02/11/2018   Influenza,inj,Quad PF,6+ Mos 01/15/2014   Influenza-Unspecified 01/26/2017   Moderna Sars-Covid-2 Vaccination 05/24/2019, 06/21/2019, 12/22/2019   PPD Test 11/15/2020   Pneumococcal Conjugate-13 02/04/2015   Pneumococcal Polysaccharide-23 10/31/2013, 01/29/2020   Tdap 02/07/2013    TDAP status: Due, Education has been provided regarding the importance of this vaccine. Advised may receive this vaccine at local pharmacy or Health Dept. Aware to provide a copy of the vaccination record if obtained from local pharmacy or Health Dept. Verbalized acceptance and understanding.  Flu Vaccine status: Up to date  Pneumococcal vaccine status: Up to date  Covid-19 vaccine  status: Information provided on how to obtain vaccines.   Qualifies for Shingles Vaccine? Yes   Zostavax completed No   Shingrix Completed?: No.    Education has been provided regarding the importance of this vaccine. Patient has been advised to call insurance company to determine out of pocket expense if they have not yet received this vaccine. Advised may also receive vaccine at local pharmacy or Health Dept. Verbalized acceptance and understanding.  Screening Tests Health Maintenance  Topic Date Due   HEMOGLOBIN A1C  02/17/2023   Diabetic kidney evaluation - eGFR measurement  04/19/2023   Diabetic kidney evaluation - Urine ACR  04/19/2023   DTaP/Tdap/Td (2 - Td or Tdap) 03/20/2024 (Originally 02/08/2023)   FOOT EXAM  08/17/2023   OPHTHALMOLOGY EXAM  12/20/2023   Medicare Annual Wellness (AWV)  03/20/2024   MAMMOGRAM  07/31/2024   Fecal DNA (Cologuard)  06/20/2025   DEXA SCAN  11/26/2030   Pneumonia Vaccine 49+ Years old  Completed   INFLUENZA VACCINE  Completed   Hepatitis C Screening  Completed   HPV VACCINES  Aged Out   Colonoscopy  Discontinued   COVID-19 Vaccine  Discontinued   Zoster Vaccines- Shingrix  Discontinued    Health Maintenance  Health Maintenance Due  Topic Date Due   HEMOGLOBIN A1C  02/17/2023   Diabetic kidney evaluation - eGFR measurement  04/19/2023   Diabetic kidney evaluation - Urine ACR  04/19/2023    Colonoscopy -2024 march was Positive   Mammogram status: Completed  . Repeat every year  Bone Density status: Completed 2022. Results reflect: Bone density results: NORMAL. Repeat every 10 years.  Lung Cancer Screening: (Low Dose CT Chest recommended if Age 52-80 years, 20 pack-year currently smoking OR have quit w/in 15years.) does not qualify.   Lung Cancer Screening Referral:   Additional Screening:  Hepatitis C Screening: does not qualify; Completed 2017  Vision Screening: Recommended annual ophthalmology exams for early detection of  glaucoma and other disorders of the eye. Is the patient up to date with their annual eye exam?  Yes  Who is the provider or what is the name of the office in which the patient attends annual eye exams? [facility] If pt is not established with a provider, would they like to be referred to a provider  to establish care? No .   Dental Screening: Recommended annual dental exams for proper oral hygiene  Nutrition Risk Assessment:  Has the patient had any N/V/D within the last 2 months?  No  Does the patient have any non-healing wounds?  No  Has the patient had any unintentional weight loss or weight gain?  No   Diabetes:  Is the patient diabetic?  Yes  If diabetic, was a CBG obtained today?  No  Did the patient bring in their glucometer from home?  No  How often do you monitor your CBG's? Continuous Monitor.   Financial Strains and Diabetes Management:  Are you having any financial strains with the device, your supplies or your medication? No .  Does the patient want to be seen by Chronic Care Management for management of their diabetes?  No  Would the patient like to be referred to a Nutritionist or for Diabetic Management?  No   Diabetic Exams:  Diabetic Eye Exam: Completed.  Pt has been advised about the importance in completing this exam.  Diabetic Foot Exam: . Pt has been advised about the importance in completing this exam.     Community Resource Referral / Chronic Care Management: CRR required this visit?  No   CCM required this visit?  No     Plan:     I have personally reviewed and noted the following in the patient's chart:   Medical and social history Use of alcohol, tobacco or illicit drugs  Current medications and supplements including opioid prescriptions. Patient is currently taking opioid prescriptions. Information provided to patient regarding non-opioid alternatives. Patient advised to discuss non-opioid treatment plan with their provider. Functional ability and  status Nutritional status Physical activity Advanced directives List of other physicians Hospitalizations, surgeries, and ER visits in previous 12 months Vitals Screenings to include cognitive, depression, and falls Referrals and appointments  In addition, I have reviewed and discussed with patient certain preventive protocols, quality metrics, and best practice recommendations. A written personalized care plan for preventive services as well as general preventive health recommendations were provided to patient.     Remi Haggard, LPN   [id number]   After Visit Summary: (MyChart) Due to this being a telephonic visit, the after visit summary with patients personalized plan was offered to patient via MyChart   Nurse Notes:

## 2023-03-26 ENCOUNTER — Ambulatory Visit (INDEPENDENT_AMBULATORY_CARE_PROVIDER_SITE_OTHER): Payer: PPO | Admitting: Physical Medicine and Rehabilitation

## 2023-03-26 ENCOUNTER — Other Ambulatory Visit: Payer: Self-pay

## 2023-03-26 DIAGNOSIS — M25551 Pain in right hip: Secondary | ICD-10-CM

## 2023-03-26 MED ORDER — TRIAMCINOLONE ACETONIDE 40 MG/ML IJ SUSP
40.0000 mg | INTRAMUSCULAR | Status: AC | PRN
  Administered 2023-03-26: 40 mg via INTRA_ARTICULAR

## 2023-03-26 MED ORDER — BUPIVACAINE HCL 0.25 % IJ SOLN
4.0000 mL | INTRAMUSCULAR | Status: AC | PRN
  Administered 2023-03-26: 4 mL via INTRA_ARTICULAR

## 2023-03-26 NOTE — Progress Notes (Signed)
TERINA BRAIN - 72 y.o. female MRN [id number]  Date of birth: [birth year]  Office Visit Note: Visit Date: 03/26/2023 PCP: Natalia Leatherwood, DO Referred by: Natalia Leatherwood, DO  Subjective: No chief complaint on file.  HPI:  DEBROAH ASHMAN is a 72 y.o. female who comes in today for planned repeat Right anesthetic hip arthrogram with fluoroscopic guidance.  The patient has failed conservative care including home exercise, medications, time and activity modification. Prior injection gave more than 50% relief for several months. This injection will be diagnostic and hopefully therapeutic.  Please see requesting physician notes for further details and justification. Images today show end-stage OA of the right hip. This is a change from MRI of 2022. Prior to next injection will f/up with Dr. Magnus Ivan. She has a lot of social issues with her husband who has Alzheimer's dementia that has progressed.   Referring: Dr. Doneen Poisson   ROS Otherwise per HPI.  Assessment & Plan: Visit Diagnoses:    ICD-10-CM   1. Pain in right hip  M25.551 Large Joint Inj: R hip joint    XR C-ARM NO REPORT      Plan: No additional findings.   Meds & Orders: No orders of the defined types were placed in this encounter.   Orders Placed This Encounter  Procedures   Large Joint Inj: R hip joint   XR C-ARM NO REPORT    Follow-up: Return for visit to requesting provider as needed.   Procedures: Large Joint Inj: R hip joint on 03/26/2023 1:15 PM Indications: diagnostic evaluation and pain Details: 22 G 3.5 in needle, fluoroscopy-guided anterior approach  Arthrogram: No  Medications: 4 mL bupivacaine 0.25 %; 40 mg triamcinolone acetonide 40 MG/ML Outcome: tolerated well, no immediate complications  There was excellent flow of contrast producing a partial arthrogram of the hip. The patient did have relief of symptoms during the anesthetic phase of the injection. Procedure, treatment  alternatives, risks and benefits explained, specific risks discussed. Consent was given by the patient. Immediately prior to procedure a time out was called to verify the correct patient, procedure, equipment, support staff and site/side marked as required. Patient was prepped and draped in the usual sterile fashion.          Clinical History: EXAM: MRI CERVICAL SPINE WITHOUT CONTRAST   TECHNIQUE: Multiplanar, multisequence MR imaging of the cervical spine was performed. No intravenous contrast was administered.   COMPARISON:  CT cervical 01/28/2021; X-ray cervical 06/04/2019.   FINDINGS: Alignment: Physiologic.   Vertebrae: No fracture, evidence of discitis, or bone lesion. Marrow edema at the left C1-2 facet. STIR hyperintensity on both sides of the right C6-7 facet is likely subchondral cystic change.   Cord: Normal signal and morphology.   Posterior Fossa, vertebral arteries, paraspinal tissues: Negative for perispinal mass or inflammation   Disc levels:   C1-2: Retro dental ligamentous thickening and atlantal dental degeneration. Left more than right degenerative facet spurring with joint space narrowing and spurring potentially affecting the left C2 nerve root.   C2-3: Degenerative facet spurring asymmetric to the left. No neural impingement   C3-4: Disc narrowing and bulging with bilateral uncovertebral ridging. Advanced bilateral facet spurring and bilateral foraminal impingement. Small bilateral paracentral disc protrusion and ridging without neural compression   C4-5: Disc narrowing with asymmetric leftward bulging and left bulky uncovertebral spurring with underlying protrusion. Asymmetric left facet spurring. Advanced left foraminal impingement   C5-6: Disc narrowing and bulging with uncovertebral and facet spurring  asymmetric to the left where there is moderate foraminal stenosis.   C6-7: Disc narrowing and bulging with bilateral uncovertebral spurring  and foraminal impingement.   C7-T1:Small bilateral foraminal protrusion. Negative facets. Mild right foraminal narrowing based on sagittal images.   IMPRESSION: 1. Generalized cervical spine degeneration with notable active facet arthritis on the left at C1-2. 2. Degenerative foraminal impingement seen on the left at C3-4 to C6-7 and on the right at C3-4 and C6-7. Potential left C2 impingement related to #1. 3. Diffusely patent spinal canal.     Electronically Signed   By: Tiburcio Pea M.D.   On: 07/27/2021 10:32     Objective:  VS:  HT:    WT:   BMI:     BP:   HR: bpm  TEMP: ( )  RESP:  Physical Exam Vitals and nursing note reviewed.  Constitutional:      General: She is not in acute distress.    Appearance: Normal appearance. She is well-developed. She is not ill-appearing.  HENT:     Head: Normocephalic and atraumatic.     Right Ear: External ear normal.     Left Ear: External ear normal.  Eyes:     Extraocular Movements: Extraocular movements intact.     Conjunctiva/sclera: Conjunctivae normal.     Pupils: Pupils are equal, round, and reactive to light.  Cardiovascular:     Rate and Rhythm: Normal rate.     Pulses: Normal pulses.  Pulmonary:     Effort: Pulmonary effort is normal. No respiratory distress.  Abdominal:     General: There is no distension.     Palpations: Abdomen is soft.  Musculoskeletal:        General: Tenderness present.     Cervical back: Neck supple.     Right lower leg: No edema.     Left lower leg: No edema.     Comments: Patient has good distal strength.  No clonus or focal weakness. Painful decreased range of motion with internal rotation of the right hip. Ambulated with a cane.  Skin:    General: Skin is warm and dry.     Findings: No erythema, lesion or rash.  Neurological:     General: No focal deficit present.     Mental Status: She is alert and oriented to person, place, and time.     Cranial Nerves: No cranial nerve  deficit.     Sensory: No sensory deficit.     Motor: No weakness or abnormal muscle tone.     Coordination: Coordination normal.     Gait: Gait abnormal.  Psychiatric:        Mood and Affect: Mood normal.        Behavior: Behavior normal.      Imaging: No results found.

## 2023-03-28 ENCOUNTER — Other Ambulatory Visit: Payer: Self-pay | Admitting: Family Medicine

## 2023-03-28 ENCOUNTER — Encounter: Payer: Self-pay | Admitting: Internal Medicine

## 2023-03-28 MED ORDER — LISINOPRIL 10 MG PO TABS: 10.0000 mg | ORAL_TABLET | Freq: Every day | ORAL | 0 refills | Status: DC

## 2023-03-28 MED ORDER — METOPROLOL TARTRATE 25 MG PO TABS: ORAL_TABLET | ORAL | 0 refills | Status: DC

## 2023-04-13 ENCOUNTER — Encounter: Payer: Self-pay | Admitting: Physical Medicine and Rehabilitation

## 2023-04-16 ENCOUNTER — Ambulatory Visit: Payer: PPO | Admitting: Internal Medicine

## 2023-05-03 NOTE — Progress Notes (Deleted)
 Office Visit Note  Patient: Priscilla [name]             Date of Birth: 02-08-1951           MRN: [id number]             PCP: Catherine Charlies LABOR, DO Referring: Catherine Charlies LABOR, DO Visit Date: 05/17/2023 Occupation: @GUAROCC @  Subjective:  No chief complaint on file.   History of Present Illness: Priscilla [name] is a 72 y.o. female ***     Activities of Daily Living:  Patient reports morning stiffness for *** {minute/hour:19697}.   Patient {ACTIONS;DENIES/REPORTS:21021675::Denies} nocturnal pain.  Difficulty dressing/grooming: {ACTIONS;DENIES/REPORTS:21021675::Denies} Difficulty climbing stairs: {ACTIONS;DENIES/REPORTS:21021675::Denies} Difficulty getting out of chair: {ACTIONS;DENIES/REPORTS:21021675::Denies} Difficulty using hands for taps, buttons, cutlery, and/or writing: {ACTIONS;DENIES/REPORTS:21021675::Denies}  No Rheumatology ROS completed.   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   Gastric bypass status for obesity 08/31/2016   Iron  deficiency anemia 04/28/2016   Other insomnia 03/14/2016   DDD (degenerative disc disease), cervical 03/14/2016   Spondylosis without myelopathy or radiculopathy, lumbar region 01/31/2016   Anxiety and depression 06/25/2014   Psoriatic arthritis - sees rheumatologist 02/20/2014   Asthma 12/14/2008   Diabetic peripheral neuropathy associated with type 2 diabetes mellitus (HCC) 08/06/2008   Hyperlipemia 11/12/2006   Essential hypertension 11/12/2006    Past Medical History:  Diagnosis Date   Anemia 12/14/2008   Anxiety and depression 12/14/2008   Asthma 11/12/2006   CAD (coronary artery disease) 12/14/2008   hx transluminal coronary angioplasty   Cataract    Cervical dystonia     Depression    Diabetes mellitus with neuropathy (HCC) 12/14/2008   Fibromyalgia 01/04/2007   sees rheuamtology   Gastric bypass status for obesity 08/31/2016   Hyperlipemia 01/21/2010   Hypertension 12/14/2008   Leg edema 12/14/2008   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   OSA (obstructive sleep apnea) 08/06/2008   Osteoarthritis 12/14/2008   Palpitations 11/12/2006   hx sinus tachy   Psoriatic arthritis (HCC)    sees rheumatology   Rotator cuff injury     Family History  Problem Relation Age of Onset   Arthritis Mother    Alzheimer's disease Mother    Stroke Father    Alcohol abuse Father    Diabetes Father    Heart disease Father    Early death Father 64   Diabetes Sister    Arthritis Sister    COPD Sister    Arthritis Maternal Aunt    Diabetes Maternal Aunt    Early death Maternal Aunt 29   Diabetes Maternal Uncle    Cancer Paternal Aunt    COPD Paternal Aunt    Heart disease Paternal Aunt    Heart disease Paternal Uncle    Alcohol abuse Paternal Grandfather    Heart disease Paternal Grandfather    Early death Paternal Grandfather 28   Stroke Paternal Grandfather    Hypertension Son    Autoimmune disease Daughter    Breast cancer Neg Hx    Past Surgical History:  Procedure Laterality Date   ABDOMINAL HYSTERECTOMY     CHOLECYSTECTOMY     COLONOSCOPY  2024   LAPAROSCOPIC GASTRIC BANDING     Lumbar Facet Joint Intra-Articular Injection(s) with Fluoroscopic Guidance  09/13/2020  L4-5; Dr. Eldonna   PTCA     WRIST SURGERY     LEFT   Social History   Social History Narrative   Married, Marcey. 2 children name Sherida and Delon.   Some college, retired comptroller.   Drinks caffeine.   Take a daily vitamin.   Wears her seatbelt, exercises 3 times a week.   Smoke detector in the home.   Firearms in the home.   Feels safe in her relationships.   Right handed   Immunization History  Administered Date(s) Administered   Fluad Quad(high Dose  65+) 12/03/2018, 01/29/2020, 03/24/2022   Fluad Trivalent(High Dose 65+) 12/25/2022   Influenza Split 01/21/2013   Influenza, High Dose Seasonal PF 02/04/2015, 02/09/2016, 02/11/2018   Influenza,inj,Quad PF,6+ Mos 01/15/2014   Influenza-Unspecified 01/26/2017   Moderna Sars-Covid-2 Vaccination 05/24/2019, 06/21/2019, 12/22/2019   PPD Test 11/15/2020   Pneumococcal Conjugate-13 02/04/2015   Pneumococcal Polysaccharide-23 10/31/2013, 01/29/2020   Tdap 02/07/2013     Objective: Vital Signs: LMP  (LMP Unknown)    Physical Exam   Musculoskeletal Exam: ***  CDAI Exam: CDAI Score: -- Patient Global: --; Provider Global: -- Swollen: --; Tender: -- Joint Exam 05/17/2023   No joint exam has been documented for this visit   There is currently no information documented on the homunculus. Go to the Rheumatology activity and complete the homunculus joint exam.  Investigation: No additional findings.  Imaging: No results found.  Recent Labs: Lab Results  Component Value Date   WBC 6.7 07/06/2022   HGB 13.5 07/06/2022   PLT 282.0 07/06/2022   NA 140 04/18/2022   K 4.6 04/18/2022   CL 101 04/18/2022   CO2 28 04/18/2022   GLUCOSE 133 (H) 04/18/2022   BUN 25 (H) 04/18/2022   CREATININE 0.79 04/18/2022   BILITOT 0.4 04/18/2022   ALKPHOS 51 04/18/2022   AST 14 04/18/2022   ALT 13 04/18/2022   PROT 6.8 04/18/2022   ALBUMIN 4.2 04/18/2022   CALCIUM  9.4 04/18/2022   GFRAA >60 11/19/2019    Speciality Comments: No specialty comments available.  Procedures:  No procedures performed Allergies: Patient has no known allergies.   Assessment / Plan:     Visit Diagnoses: Psoriatic arthritis (HCC)  Psoriasis  Primary osteoarthritis of both hands  Trochanteric bursitis, right hip  Pain in both feet  History of scoliosis  Dystonia  DDD (degenerative disc disease), cervical  Degeneration of intervertebral disc of lumbar region without discogenic back pain or lower  extremity pain  Fibromyalgia  Other fatigue  Other insomnia  Gastric bypass status for obesity  History of diabetes mellitus  History of asthma  History of vitamin D  deficiency  Coarse tremors  History of sleep apnea  Anxiety and depression  Orders: No orders of the defined types were placed in this encounter.  No orders of the defined types were placed in this encounter.   Face-to-face time spent with patient was *** minutes. Greater than 50% of time was spent in counseling and coordination of care.  Follow-Up Instructions: No follow-ups on file.   Waddell CHRISTELLA Craze, PA-C  Note - This record has been created using Dragon software.  Chart creation errors have been sought, but may not always  have been located. Such creation errors do not reflect on  the standard of medical care.

## 2023-05-07 ENCOUNTER — Ambulatory Visit: Payer: PPO | Admitting: Internal Medicine

## 2023-05-07 ENCOUNTER — Encounter: Payer: Self-pay | Admitting: Internal Medicine

## 2023-05-07 VITALS — BP 110/66 | HR 72 | Ht 63.0 in | Wt 145.6 lb

## 2023-05-07 DIAGNOSIS — E1142 Type 2 diabetes mellitus with diabetic polyneuropathy: Secondary | ICD-10-CM | POA: Diagnosis not present

## 2023-05-07 DIAGNOSIS — E11319 Type 2 diabetes mellitus with unspecified diabetic retinopathy without macular edema: Secondary | ICD-10-CM

## 2023-05-07 DIAGNOSIS — E785 Hyperlipidemia, unspecified: Secondary | ICD-10-CM | POA: Diagnosis not present

## 2023-05-07 DIAGNOSIS — Z794 Long term (current) use of insulin: Secondary | ICD-10-CM

## 2023-05-07 LAB — POCT GLYCOSYLATED HEMOGLOBIN (HGB A1C): Hemoglobin A1C: 7.1 % — AB (ref 4.0–5.6)

## 2023-05-07 MED ORDER — SEMAGLUTIDE(0.25 OR 0.5MG/DOS) 2 MG/3ML ~~LOC~~ SOPN: 0.5000 mg | PEN_INJECTOR | SUBCUTANEOUS | 3 refills | Status: DC

## 2023-05-07 MED ORDER — LANTUS SOLOSTAR 100 UNIT/ML ~~LOC~~ SOPN: 12.0000 [IU] | PEN_INJECTOR | Freq: Every day | SUBCUTANEOUS | 5 refills | Status: DC

## 2023-05-07 MED ORDER — FREESTYLE LIBRE 3 READER DEVI: 1.0000 | Freq: Once | 0 refills | Status: AC

## 2023-05-07 MED ORDER — FREESTYLE LIBRE 3 PLUS SENSOR MISC: 1.0000 | 3 refills | Status: DC

## 2023-05-07 MED ORDER — METFORMIN HCL 1000 MG PO TABS: 1000.0000 mg | ORAL_TABLET | Freq: Two times a day (BID) | ORAL | 3 refills | Status: AC

## 2023-05-07 NOTE — Patient Instructions (Addendum)
Please continue: - Metformin 1000 mg 2x a day with meals - Ozempic 0.5 mg weekly - Novolog 4-6 units only before a large meal - Lantus 12 units daily (may need to decrease to 10 units if you see lows at night again)  Please come back for a follow-up appointment in 4 months.

## 2023-05-07 NOTE — Addendum Note (Signed)
Addended by: Pollie Meyer on: 05/07/2023 03:34 PM   Modules accepted: Orders

## 2023-05-07 NOTE — Progress Notes (Signed)
Patient ID: Priscilla [name], female   DOB: December 14, 1950, 73 y.o.   MRN: [id number]  HPI: Priscilla [name] is a 73 y.o.-year-old female, returning for follow-up for DM2, dx in 1990s, insulin-dependent since ~2001, uncontrolled, with complications (DR OS, PN). She previously saw Drs. Lucianne Muss (distant past) and Al[place] . Last visit with me 4 months ago.  Interim history: She continues to have a lot of stress at home due to her husband who has dementia - progressive - now bedbound.  She had to move in with her daughter as she cannot take care of him by herself.  She has urinary incontinence, no blurry vision, chest pain.  She also has tremors.  She has cervical dystonia. She has R hip pain - gets steroid injections. However, she tells me that she will need to have a hip replacement.  She is walking with a cane today.  Reviewed HbA1c levels: Lab Results  Component Value Date   HGBA1C 6.8 12/14/2022   HGBA1C 7.2 (A) 08/17/2022   HGBA1C 7.0 (A) 04/18/2022   HGBA1C 6.9 (A) 12/14/2021   HGBA1C 7.0 (A) 08/09/2021   HGBA1C 6.6 (A) 03/07/2021   HGBA1C 6.7 (A) 10/29/2020   HGBA1C 6.2 (A) 06/07/2020   HGBA1C 6.3 (A) 12/30/2019   HGBA1C 6.3 (A) 08/25/2019   HGBA1C 5.9 (A) 04/24/2019   HGBA1C 6.0 (A) 02/13/2019   HGBA1C 6.1 (A) 09/20/2018   HGBA1C 6.4 (A) 05/23/2018   HGBA1C 6.0 (A) 10/17/2017  03/16/2017: HbA1c calculated from fructosamine: 5.6% 10/18/2015: HbA1c calculated from fructosamine: 5.7% 02/2015: HbA1c 7.4%, HbA1c calculated from fructosamine: 5.6%  She is on: - Metformin 1000 mg 2x a day with meals - Levemir 30 >> .Marland Kitchen. 15  >> Lantus 15 >> 12  in am - Mealtime Novolog:  4-6 before meals (mostly uses 4 units) >>  4-6 units 15 minutes before larger meals (takes this 4x a week, on ave.) - Ozempic 0.25 >> 0.5 mg weekly-started 08/2022 (out for 1 mo in 03/2023) She was on Victoza 2.5 mg daily >> stopped as she could not afford this.  Pt.checks her sugars more than 4 times a day with her  CGM (with receiver):  Previously:  Previously:   Lowest sugar was 42 >> ...  70 >> 50s >> 57; she has hypoglycemia awareness in the in the 70s. Highest sugar was 300 (steroids) >> ... 265 >> <200 >> 240  Glucometer: Freestyle  -No CKD; last BUN/creatinine:  Lab Results  Component Value Date   BUN 25 (H) 04/18/2022   CREATININE 0.79 04/18/2022   Lab Results  Component Value Date   MICRALBCREAT 1.0 04/18/2022   MICRALBCREAT 0.9 05/06/2021   MICRALBCREAT 0.2 09/26/2013  On lisinopril 1.25 mg daily  -+ HL; last set of lipids: Lab Results  Component Value Date   CHOL 142 04/18/2022   HDL 64.70 04/18/2022   LDLCALC 64 04/18/2022   TRIG 67.0 04/18/2022   CHOLHDL 2 04/18/2022  On pravastatin 40.  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  She has floaters.  -+ numbness, tingling, burning in her feet - stable.  On B12.  I recommended alpha lipoic acid but she did not take this due to the large size of the pill.  Previously on Neurontin 100 mg daily per podiatry, now seldom, 2/2 being also on Ambien.  Last foot exam 08/17/2022.  Latest TSH normal: Lab Results  Component Value  Date   TSH 2.47 07/06/2022   She has a history of lap band surgery >> she does not usually eat large meals but she usually grazes, especially at night She has neck pain and dizziness and was diagnosed with possible cervical dystonia.  She sees neurology. She has hip pain >> had steroid inj's.  No personal history of pancreatitis or family history of medullary thyroid cancer.  ROS: + See HPI  I reviewed pt's medications, allergies, PMH, social hx, family hx, and changes were documented in the history of present illness. Otherwise, unchanged from my initial visit note.  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 Alvester Morin   PTCA     WRIST SURGERY     LEFT   Social History   Social History   Marital Status: Married    Spouse Name: N/A   Number of Children: N/A   Occupational History   Not on file.   Social History Main Topics   Smoking status: Never Smoker    Smokeless tobacco: Never Used   Alcohol Use: Yes     Comment: glass of wine-specially occasion   Drug Use: No   Current Outpatient Medications on File Prior to Visit  Medication Sig Dispense Refill   aspirin 81 MG tablet Take 81 mg by mouth daily.     atorvastatin (LIPITOR) 40 MG tablet Take 1 tablet (40 mg total) by mouth daily. 30 tablet 11   baclofen (LIORESAL) 10 MG tablet Take 0.5-1 tablets (5-10 mg total) by mouth in the morning and at bedtime. 60 each 5   BD PEN NEEDLE NANO 2ND GEN 32G X 4 MM MISC USE AS DIRECTED THREE TIMES DAILY FOR INSULIN 300 each 3   calcium-vitamin D 250-100 MG-UNIT per tablet Take 1 tablet by mouth daily.      clobetasol (TEMOVATE) 0.05 % external solution Apply 1 Application topically 2 (two) times daily. 50 mL 0   COLLAGEN PO Take by mouth. Adds to tea daily     Continuous Blood Gluc Receiver (FREESTYLE LIBRE 2 READER)  DEVI Use as instructed to check blood sugar 4X daily 1 each 0   Continuous Blood Gluc Sensor (FREESTYLE LIBRE 2 SENSOR) MISC Use sensors with CGM to check sugars. 6 each 3   Cyanocobalamin (VITAMIN B12 PO) Place under the tongue.     DULoxetine (CYMBALTA) 60 MG capsule Take 1 capsule (60 mg total) by mouth daily. 30 capsule 0   fluticasone (FLONASE) 50 MCG/ACT nasal spray Place 2 sprays into both nostrils in  the morning and at bedtime. 16 g 6   glucose blood (ONETOUCH VERIO) test strip USE TO TEST BLOOD SUGAR TWICE A DAY DX: E11.319 150 each 3   insulin glargine (LANTUS SOLOSTAR) 100 UNIT/ML Solostar Pen Inject 15 Units into the skin daily. 15 mL 11   Insulin Syringe-Needle U-100 (BD INSULIN SYRINGE U/F) 31G X 5/16" 0.5 ML MISC Use 1x a day for insulin 100 each 3   ketoconazole (NIZORAL) 2 % cream APPLY TOPICALLY TO THE AFFECTED AREA TWICE DAILY 60 g 3   lisinopril (ZESTRIL) 10 MG tablet Take 1 tablet (10 mg total) by mouth daily. 30 tablet 0   meloxicam (MOBIC) 15 MG tablet TAKE 1 TABLET(15 MG) BY MOUTH DAILY 90 tablet 3   metFORMIN (GLUCOPHAGE) 1000 MG tablet Take 1 tablet (1,000 mg total) by mouth 2 (two) times daily with a meal. 180 tablet 2   metoprolol tartrate (LOPRESSOR) 25 MG tablet TAKE 1 TABLET(25 MG) BY MOUTH TWICE DAILY. OFFICE VISIT NEEDED FOR FURTHER REFILLS 60 tablet 0   NOVOLOG FLEXPEN 100 UNIT/ML FlexPen Inject 2-6 Units into the skin 3 (three) times daily with meals. 30 mL 1   pregabalin (LYRICA) 75 MG capsule Take 1 capsule (75 mg total) by mouth 2 (two) times daily. 60 capsule 5   Semaglutide,0.25 or 0.5MG /DOS, 2 MG/3ML SOPN Inject 0.5 mg into the skin once a week. 9 mL 3   sertraline (ZOLOFT) 100 MG tablet Take 1 tablet (100 mg total) by mouth daily. 30 tablet 0   Vitamin D, Ergocalciferol, (DRISDOL) 1.25 MG (50000 UNIT) CAPS capsule TAKE 1 CAPSULE BY MOUTH EVERY 7 DAYS 4 capsule 6   zolpidem (AMBIEN) 5 MG tablet TAKE 1 TABLET BY MOUTH AT BEDTIME FOR INSOMNIA 30 tablet 0    No current facility-administered medications on file prior to visit.   No Known Allergies Family History  Problem Relation Age of Onset   Arthritis Mother    Alzheimer's disease Mother    Stroke Father    Alcohol abuse Father    Diabetes Father    Heart disease Father    Early death Father 47   Diabetes Sister    Arthritis Sister    COPD Sister    Arthritis Maternal Aunt    Diabetes Maternal Aunt    Early death Maternal Aunt 110   Diabetes Maternal Uncle    Cancer Paternal Aunt    COPD Paternal Aunt    Heart disease Paternal Aunt    Heart disease Paternal Uncle    Alcohol abuse Paternal Grandfather    Heart disease Paternal Grandfather    Early death Paternal Grandfather 55   Stroke Paternal Grandfather    Hypertension Son    Autoimmune disease Daughter    Breast cancer Neg Hx    PE: BP 110/66   Pulse 72   Ht 5\' 3"  (1.6 m)   Wt 145 lb 9.6 oz (66 kg)   LMP  (LMP Unknown)   SpO2 97%   BMI 25.79 kg/m   Wt Readings from Last 3 Encounters:  05/07/23 145 lb 9.6 oz (66 kg)  12/14/22 160 lb 12.8 oz (72.9 kg)  11/14/22 159 lb 6.4 oz (72.3 kg)   Constitutional: Normal weight, in NAD Eyes: EOMI, no exophthalmos ENT: no thyromegaly, no cervical lymphadenopathy Cardiovascular: RRR, No MRG Respiratory: CTA B Musculoskeletal: no deformities Skin: no rashes Neurological: + tremor with outstretched hands  ASSESSMENT: 1. DM2, insulin-dependent, uncontrolled, with complications - Diabetic retinopathy left eye - resolved -  Peripheral neuropathy  Component     Latest Ref Rng & Units 04/24/2019  Hemoglobin A1C     4.0 - 5.6 % 5.9 (A)  Islet Cell Ab     Neg:<1:1 Negative  ZNT8 Antibodies     U/mL <15  C-Peptide     0.80 - 3.85 ng/mL 2.35  Glucose, Plasma     65 - 99 mg/dL 161 (H)  Glutamic Acid Decarb Ab     <5 IU/mL <5  No insulin deficiency or pancreatic autoimmunity.  2. HL  3. PN   PLAN:  1. Patient with longstanding, uncontrolled, type 2 diabetes, on  metformin, basal-bolus insulin regimen, and weekly GLP-1 receptor agonist, with improving control.  At last visit, HbA1c was lower, at 6.8%, decreased from 7.2%.  At that time, we decreased her Lantus dose and continued NovoLog only before a larger meal.  She had some low blood sugars in the afternoon at that time despite reducing NovoLog doses previously. CGM interpretation: -At today's visit, we reviewed her CGM downloads: It appears that 95% of values are in target range (goal >70%), while 4% are higher than 180 (goal <25%), and 1% are lower than 70 (goal <4%).  The calculated average blood sugar is 118.  The projected HbA1c for the next 3 months (GMI) is 6.1%. -Reviewing the CGM trends, sugars are well-controlled, increasing slightly after lunch and less so after dinner, but with the vast majority of the blood sugars at goal.  She has had some lower blood sugars during the night, down to the 50s, with some of them being compression lows, but not all.  However, I do not see low blood sugars in the last week so for now I advised her to continue on the same regimen and decrease the dose of Lantus if she starts seeing low blood sugars again.  Of note, sugars have been higher in December and first week of this month as she was out of Ozempic.  She is now back on it. - I advised her to:  Patient Instructions  Please continue: - Metformin 1000 mg 2x a day with meals - Ozempic 0.5 mg weekly - Novolog 4-6 units only before a large meal - Lantus 12 units daily (may need to decrease to 10 units if you see lows at night again)  Please come back for a follow-up appointment in 4 months.  - we checked her HbA1c: 7.1% (higher) - advised to check sugars at different times of the day - 4x a day, rotating check times - advised for yearly eye exams >> she is UTD - wi check an ACR todayll - return to clinic in 3-4 months   2. HL -Latest lipid panel from 04/2022: Fractions at goal: Lab Results  Component Value  Date   CHOL 142 04/18/2022   HDL 64.70 04/18/2022   LDLCALC 64 04/18/2022   TRIG 67.0 04/18/2022   CHOLHDL 2 04/18/2022  -She is on pravastatin 40 mg daily without side effects -She is due for another lipid panel but would like to wait until the new appointment with PCP to have this checked  3. PN -Due to diabetes -Stable -I previously suggested alpha lipoic acid but she could not take it due to the large tablet size -She is on B12 vitamin, Cymbalta, and gabapentin-prescribed by podiatry.  These are helping.  Carlus Pavlov, MD PhD [facility] Endocrinology

## 2023-05-09 ENCOUNTER — Ambulatory Visit: Payer: PPO | Admitting: Orthopaedic Surgery

## 2023-05-09 ENCOUNTER — Encounter: Payer: Self-pay | Admitting: Internal Medicine

## 2023-05-09 ENCOUNTER — Other Ambulatory Visit (INDEPENDENT_AMBULATORY_CARE_PROVIDER_SITE_OTHER): Payer: Self-pay

## 2023-05-09 DIAGNOSIS — M25551 Pain in right hip: Secondary | ICD-10-CM

## 2023-05-09 DIAGNOSIS — M1711 Unilateral primary osteoarthritis, right knee: Secondary | ICD-10-CM | POA: Insufficient documentation

## 2023-05-09 DIAGNOSIS — M1811 Unilateral primary osteoarthritis of first carpometacarpal joint, right hand: Secondary | ICD-10-CM

## 2023-05-09 DIAGNOSIS — M65341 Trigger finger, right ring finger: Secondary | ICD-10-CM | POA: Diagnosis not present

## 2023-05-09 LAB — MICROALBUMIN / CREATININE URINE RATIO
Creatinine, Urine: 273 mg/dL (ref 20–275)
Microalb Creat Ratio: 7 mg/g{creat} (ref <30)
Microalb, Ur: 1.8 mg/dL

## 2023-05-09 MED ORDER — METHYLPREDNISOLONE ACETATE 40 MG/ML IJ SUSP
40.0000 mg | INTRAMUSCULAR | Status: AC | PRN
  Administered 2023-05-09: 40 mg

## 2023-05-09 MED ORDER — LIDOCAINE HCL 1 % IJ SOLN
0.5000 mL | INTRAMUSCULAR | Status: AC | PRN
  Administered 2023-05-09: .5 mL

## 2023-05-09 MED ORDER — LIDOCAINE HCL 1 % IJ SOLN
1.0000 mL | INTRAMUSCULAR | Status: AC | PRN
  Administered 2023-05-09: 1 mL

## 2023-05-09 NOTE — Progress Notes (Signed)
The patient is someone I am seeing in the past.  She is 73 years old and has dealt with hip pain for some time now.  In 2022 we did x-ray her hip and it showed just some mild joint space narrowing.  She now ambulates with a cane and said the hip is significantly worse for her.  She has been taking care of her husband who has advanced Alzheimer's disease.  They are moving in with family soon.  She does use a cane when she ambulates.  She also reports right basilar thumb pain and right ring finger triggering.  Examination of her right hand shows pain at the base of the thumb with a positive grind test as well as triggering of the ring finger.  Examination of her right hip shows a since then with no internal or external rotation at this point with severe pain with attempts of rotation of the hip.  An AP pelvis and lateral right hip shows that the hip joint space is completely loss of the standpoint of the right hip.  It is severe in terms of the arthritis with bone-on-bone wear with cystic changes, sclerotic changes and osteophytes around the right hip.  The left hip joint space is well-maintained.  We talked in length in detail about hip replacement surgery for her right hip.  I did recommend a steroid injection in her right basilar thumb joint as well as the right ring finger triggering.  She did tolerate these well.  I did give her handout about hip replacement surgery and over her x-rays with her and took some pictures on her phone of the difference of her hip over the last 2 years.  We discussed what to expect from an intraoperative and postoperative standpoint and discussed the risk and benefits of surgery.  We will work on getting her on the schedule soon for right total hip replacement.  All question concerns were answered and addressed I did review all of her past medical history and medications within epic.  She is a diabetic but her last hemoglobin A1c recently was 7.1.    Procedure  Note  Patient: Priscilla [name]             Date of Birth: Mar 10, 1951           MRN: [id number]             Visit Date: 05/09/2023  Procedures: Visit Diagnoses:  1. Pain in right hip   2. Unilateral primary osteoarthritis, right knee   3. Trigger finger, right ring finger   4. Arthritis of carpometacarpal [facility]) joint of right thumb     Hand/UE Inj: R ring A1 for trigger finger on 05/09/2023 2:16 PM Medications: 0.5 mL lidocaine 1 %; 40 mg methylPREDNISolone acetate 40 MG/ML   Hand/UE Inj: R thumb CMC for osteoarthritis on 05/09/2023 2:17 PM Medications: 1 mL lidocaine 1 %; 40 mg methylPREDNISolone acetate 40 MG/ML

## 2023-05-14 ENCOUNTER — Encounter: Payer: Self-pay | Admitting: Family Medicine

## 2023-05-14 MED ORDER — METOPROLOL TARTRATE 25 MG PO TABS: ORAL_TABLET | ORAL | 0 refills | Status: DC

## 2023-05-14 MED ORDER — LISINOPRIL 10 MG PO TABS: 10.0000 mg | ORAL_TABLET | Freq: Every day | ORAL | 0 refills | Status: DC

## 2023-05-14 NOTE — Telephone Encounter (Signed)
I refilled her lisinopril and her metoprolol for 30 days to allow her time to get in for her appointment. Please schedule her as soon as possible for chronic conditions appointment and we will collect the lab work during that time.  20-minute slot is fine

## 2023-05-17 ENCOUNTER — Other Ambulatory Visit: Payer: Self-pay | Admitting: Orthopaedic Surgery

## 2023-05-17 ENCOUNTER — Encounter: Payer: Self-pay | Admitting: Orthopaedic Surgery

## 2023-05-17 ENCOUNTER — Ambulatory Visit: Payer: PPO | Admitting: Physician Assistant

## 2023-05-17 DIAGNOSIS — L409 Psoriasis, unspecified: Secondary | ICD-10-CM

## 2023-05-17 DIAGNOSIS — M19041 Primary osteoarthritis, right hand: Secondary | ICD-10-CM

## 2023-05-17 DIAGNOSIS — Z8739 Personal history of other diseases of the musculoskeletal system and connective tissue: Secondary | ICD-10-CM

## 2023-05-17 DIAGNOSIS — Z8709 Personal history of other diseases of the respiratory system: Secondary | ICD-10-CM

## 2023-05-17 DIAGNOSIS — M797 Fibromyalgia: Secondary | ICD-10-CM

## 2023-05-17 DIAGNOSIS — G252 Other specified forms of tremor: Secondary | ICD-10-CM

## 2023-05-17 DIAGNOSIS — M7061 Trochanteric bursitis, right hip: Secondary | ICD-10-CM

## 2023-05-17 DIAGNOSIS — G249 Dystonia, unspecified: Secondary | ICD-10-CM

## 2023-05-17 DIAGNOSIS — Z8669 Personal history of other diseases of the nervous system and sense organs: Secondary | ICD-10-CM

## 2023-05-17 DIAGNOSIS — L4050 Arthropathic psoriasis, unspecified: Secondary | ICD-10-CM

## 2023-05-17 DIAGNOSIS — Z8639 Personal history of other endocrine, nutritional and metabolic disease: Secondary | ICD-10-CM

## 2023-05-17 DIAGNOSIS — M5030 Other cervical disc degeneration, unspecified cervical region: Secondary | ICD-10-CM

## 2023-05-17 DIAGNOSIS — Z9884 Bariatric surgery status: Secondary | ICD-10-CM

## 2023-05-17 DIAGNOSIS — G4709 Other insomnia: Secondary | ICD-10-CM

## 2023-05-17 DIAGNOSIS — F32A Depression, unspecified: Secondary | ICD-10-CM

## 2023-05-17 DIAGNOSIS — M79671 Pain in right foot: Secondary | ICD-10-CM

## 2023-05-17 DIAGNOSIS — M51369 Other intervertebral disc degeneration, lumbar region without mention of lumbar back pain or lower extremity pain: Secondary | ICD-10-CM

## 2023-05-17 DIAGNOSIS — R5383 Other fatigue: Secondary | ICD-10-CM

## 2023-05-17 MED ORDER — TRAMADOL HCL 50 MG PO TABS: 50.0000 mg | ORAL_TABLET | Freq: Three times a day (TID) | ORAL | 0 refills | Status: DC | PRN

## 2023-05-21 ENCOUNTER — Encounter: Payer: Self-pay | Admitting: Family Medicine

## 2023-05-21 ENCOUNTER — Ambulatory Visit (INDEPENDENT_AMBULATORY_CARE_PROVIDER_SITE_OTHER): Payer: PPO | Admitting: Family Medicine

## 2023-05-21 VITALS — BP 128/80 | HR 85 | Temp 98.2°F | Wt 139.2 lb

## 2023-05-21 DIAGNOSIS — F419 Anxiety disorder, unspecified: Secondary | ICD-10-CM

## 2023-05-21 DIAGNOSIS — I10 Essential (primary) hypertension: Secondary | ICD-10-CM

## 2023-05-21 DIAGNOSIS — K909 Intestinal malabsorption, unspecified: Secondary | ICD-10-CM

## 2023-05-21 DIAGNOSIS — E1142 Type 2 diabetes mellitus with diabetic polyneuropathy: Secondary | ICD-10-CM

## 2023-05-21 DIAGNOSIS — Z794 Long term (current) use of insulin: Secondary | ICD-10-CM

## 2023-05-21 DIAGNOSIS — D508 Other iron deficiency anemias: Secondary | ICD-10-CM | POA: Diagnosis not present

## 2023-05-21 DIAGNOSIS — E782 Mixed hyperlipidemia: Secondary | ICD-10-CM | POA: Diagnosis not present

## 2023-05-21 DIAGNOSIS — F32A Depression, unspecified: Secondary | ICD-10-CM | POA: Diagnosis not present

## 2023-05-21 DIAGNOSIS — Z7984 Long term (current) use of oral hypoglycemic drugs: Secondary | ICD-10-CM

## 2023-05-21 DIAGNOSIS — E559 Vitamin D deficiency, unspecified: Secondary | ICD-10-CM | POA: Diagnosis not present

## 2023-05-21 LAB — LIPID PANEL
Cholesterol: 128 mg/dL (ref 0–200)
HDL: 60.1 mg/dL (ref >39.00)
LDL Cholesterol: 47 mg/dL (ref 0–99)
NonHDL: 67.72
Total CHOL/HDL Ratio: 2
Triglycerides: 105 mg/dL (ref 0.0–149.0)
VLDL: 21 mg/dL (ref 0.0–40.0)

## 2023-05-21 LAB — COMPREHENSIVE METABOLIC PANEL
ALT: 11 U/L (ref 0–35)
AST: 14 U/L (ref 0–37)
Albumin: 4.1 g/dL (ref 3.5–5.2)
Alkaline Phosphatase: 79 U/L (ref 39–117)
BUN: 15 mg/dL (ref 6–23)
CO2: 27 meq/L (ref 19–32)
Calcium: 9.3 mg/dL (ref 8.4–10.5)
Chloride: 101 meq/L (ref 96–112)
Creatinine, Ser: 0.76 mg/dL (ref 0.40–1.20)
GFR: 77.55 mL/min (ref >60.00)
Glucose, Bld: 122 mg/dL — ABNORMAL HIGH (ref 70–99)
Potassium: 4.4 meq/L (ref 3.5–5.1)
Sodium: 140 meq/L (ref 135–145)
Total Bilirubin: 0.5 mg/dL (ref 0.2–1.2)
Total Protein: 6.6 g/dL (ref 6.0–8.3)

## 2023-05-21 LAB — IBC + FERRITIN
Ferritin: 58.5 ng/mL (ref 10.0–291.0)
Iron: 49 ug/dL (ref 42–145)
Saturation Ratios: 15.3 % — ABNORMAL LOW (ref 20.0–50.0)
TIBC: 320.6 ug/dL (ref 250.0–450.0)
Transferrin: 229 mg/dL (ref 212.0–360.0)

## 2023-05-21 LAB — CBC
HCT: 41.7 % (ref 36.0–46.0)
Hemoglobin: 13.7 g/dL (ref 12.0–15.0)
MCHC: 32.9 g/dL (ref 30.0–36.0)
MCV: 88.9 fL (ref 78.0–100.0)
Platelets: 371 10*3/uL (ref 150.0–400.0)
RBC: 4.7 Mil/uL (ref 3.87–5.11)
RDW: 13 % (ref 11.5–15.5)
WBC: 5.9 10*3/uL (ref 4.0–10.5)

## 2023-05-21 LAB — TSH: TSH: 3.12 u[IU]/mL (ref 0.35–5.50)

## 2023-05-21 LAB — VITAMIN D 25 HYDROXY (VIT D DEFICIENCY, FRACTURES): VITD: 91.46 ng/mL (ref 30.00–100.00)

## 2023-05-21 MED ORDER — SERTRALINE HCL 100 MG PO TABS: 100.0000 mg | ORAL_TABLET | Freq: Every day | ORAL | 1 refills | Status: DC

## 2023-05-21 MED ORDER — METOPROLOL TARTRATE 25 MG PO TABS: ORAL_TABLET | ORAL | 1 refills | Status: DC

## 2023-05-21 MED ORDER — BACLOFEN 10 MG PO TABS: 5.0000 mg | ORAL_TABLET | Freq: Two times a day (BID) | ORAL | 1 refills | Status: DC

## 2023-05-21 MED ORDER — PREGABALIN 75 MG PO CAPS: 75.0000 mg | ORAL_CAPSULE | Freq: Two times a day (BID) | ORAL | 1 refills | Status: DC

## 2023-05-21 MED ORDER — ATORVASTATIN CALCIUM 40 MG PO TABS: 40.0000 mg | ORAL_TABLET | Freq: Every day | ORAL | 3 refills | Status: DC

## 2023-05-21 MED ORDER — LISINOPRIL 10 MG PO TABS: 10.0000 mg | ORAL_TABLET | Freq: Every day | ORAL | 1 refills | Status: DC

## 2023-05-21 MED ORDER — DULOXETINE HCL 60 MG PO CPEP: 60.0000 mg | ORAL_CAPSULE | Freq: Every day | ORAL | 1 refills | Status: DC

## 2023-05-21 NOTE — Patient Instructions (Addendum)
 Return in about 24 weeks (around 11/05/2023) for Routine chronic condition follow-up.        Great to see you today.  I have refilled the medication(s) we provide.   If labs were collected or images ordered, we will inform you of  results once we have received them and reviewed. We will contact you either by echart message, or telephone call.  Please give ample time to the testing facility, and our office to run,  receive and review results. Please do not call inquiring of results, even if you can see them in your chart. We will contact you as soon as we are able. If it has been over 1 week since the test was completed, and you have not yet heard from us , then please call us .    - echart message- for normal results that have been seen by the patient already.   - telephone call: abnormal results or if patient has not viewed results in their echart.  If a referral to a specialist was entered for you, please call us  in 2 weeks if you have not heard from the specialist office to schedule.

## 2023-05-21 NOTE — Progress Notes (Signed)
 Patient Care Team    Relationship Specialty Notifications Start End  Mariel Shope, DO PCP - General Family Medicine  08/29/21   Jasmine Mesi, MD Consulting Physician Orthopedic Surgery  08/06/15   Emilie Harden, MD Consulting Physician Internal Medicine  08/06/15    Comment: endocrine  Arnie Lao, MD Consulting Physician Orthopedic Surgery  08/06/15   Nicholas Bari, MD Consulting Physician Rheumatology  08/06/15   Ivor Mars, MD Consulting Physician Oncology  11/10/16   Bridget Campion, MD Consulting Physician Physical Medicine and Rehabilitation  11/10/16   Dermatology, [facility]    11/10/16    Comment: Dr. [place]  Hyatt, Georgean Kindle, [facility] Consulting Physician Podiatry  12/03/18   Tat, Von Grumbling, DO Consulting Physician Neurology  08/25/21      SUBJECTIVE Chief Complaint  Patient presents with   Hypertension    Pt is fasting.    Hyperlipidemia    HPI: Priscilla [name] is a 73 y.o. female present for Chronic Conditions/illness Management  Hypertension/hyperlipidemia: Pt reports compliance with metoprolol  25 mg BID and lisinopril  10 mg daily Patient denies chest pain, shortness of breath, dizziness or lower extremity edema.  She reports compliance with baby aspirin  and pravastatin . Diet: low sodium.  Exercise: routinely.  RF: HTN, HLD, DM, FHx HD   Peripheral neuropathy/depression/anxiety: Patient reports compliance with Lyrica , Cymbalta  60 mg a day and zoloft  100 mg qd.  She reports the combination has been working well  her depression, anxiety and neuropathy.   She is still having significant neck discomfort.  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  She is established with Ortho and PMR for her cervical degeneration and radiculopathy.      03/21/2023    2:36 PM 03/15/2022    3:02 PM 10/21/2021    9:31 AM 08/15/2021   10:08 AM 02/23/2021    8:18 AM  Depression screen PHQ 2/9  Decreased Interest 0 0 1 1 0  Down, Depressed, Hopeless 3  0 1 1 1   PHQ - 2 Score 3 0 2 2 1   Altered sleeping 0  0    Tired, decreased energy 3  3    Change in appetite 0  0    Feeling bad or failure about yourself  0  0    Trouble concentrating 1  0    Moving slowly or fidgety/restless 0  0    Suicidal thoughts 0  0    PHQ-9 Score 7  5    Difficult doing work/chores Somewhat difficult          10/21/2021    9:31 AM 10/15/2020    2:38 PM 09/23/2019    3:35 PM 03/14/2019    9:55 AM  GAD 7 : Generalized Anxiety Score  Nervous, Anxious, on Edge 3 2 3  0  Control/stop worrying 2 2 3  0  Worry too much - different things 3 2 3  0  Trouble relaxing 2 0 0 0  Restless 0 0 0 0  Easily annoyed or irritable 1 1 0 0  Afraid - awful might happen 1 0 0 0  Total GAD 7 Score 12 7 9  0  Anxiety Difficulty   Not difficult at all Not difficult at all    Patient Active Problem List   Diagnosis Date Noted   Unilateral primary osteoarthritis, right knee 05/09/2023   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   Neck pain 08/16/2021   Decreased circulation right great toe 05/28/2020   Pain in right hip  11/27/2019   Vitamin D  deficiency 10/13/2016   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   Gastric bypass status for obesity 08/31/2016   Iron  deficiency anemia 04/28/2016   Other insomnia 03/14/2016   DDD (degenerative disc disease), cervical 03/14/2016   Spondylosis without myelopathy or radiculopathy, lumbar region 01/31/2016   Anxiety and depression 06/25/2014   Psoriatic arthritis - sees rheumatologist 02/20/2014   Asthma 12/14/2008   Diabetic peripheral neuropathy associated with type 2 diabetes mellitus (HCC) 08/06/2008   Hyperlipemia 11/12/2006   Essential hypertension 11/12/2006    Social History   Tobacco Use   Smoking status: Never    Passive exposure: Past   Smokeless tobacco: Never  Substance Use Topics   Alcohol use: Yes    Comment: glass of wine-special occasion    Current Outpatient Medications:    aspirin  81 MG tablet, Take 81 mg by mouth daily., Disp:  , Rfl:    BD PEN NEEDLE NANO 2ND GEN 32G X 4 MM MISC, USE AS DIRECTED THREE TIMES DAILY FOR INSULIN , Disp: 300 each, Rfl: 3   calcium -vitamin D  250-100 MG-UNIT per tablet, Take 1 tablet by mouth daily. , Disp: , Rfl:    clobetasol  (TEMOVATE ) 0.05 % external solution, Apply 1 Application topically 2 (two) times daily., Disp: 50 mL, Rfl: 0   COLLAGEN PO, Take by mouth. Adds to tea daily, Disp: , Rfl:    Continuous Glucose Sensor (FREESTYLE LIBRE 3 PLUS SENSOR) MISC, 1 each by Does not apply route every 14 (fourteen) days., Disp: 6 each, Rfl: 3   Cyanocobalamin  (VITAMIN B12 PO), Place under the tongue., Disp: , Rfl:    fluticasone  (FLONASE ) 50 MCG/ACT nasal spray, Place 2 sprays into both nostrils in the morning and at bedtime., Disp: 16 g, Rfl: 6   glucose blood (ONETOUCH VERIO) test strip, USE TO TEST BLOOD SUGAR TWICE A DAY DX: E11.319, Disp: 150 each, Rfl: 3   insulin  glargine (LANTUS  SOLOSTAR) 100 UNIT/ML Solostar Pen, Inject 12-15 Units into the skin daily., Disp: 15 mL, Rfl: 5   Insulin  Syringe-Needle U-100 (BD INSULIN  SYRINGE U/F) 31G X 5/16" 0.5 ML MISC, Use 1x a day for insulin , Disp: 100 each, Rfl: 3   ketoconazole  (NIZORAL ) 2 % cream, APPLY TOPICALLY TO THE AFFECTED AREA TWICE DAILY, Disp: 60 g, Rfl: 3   meloxicam  (MOBIC ) 15 MG tablet, TAKE 1 TABLET(15 MG) BY MOUTH DAILY, Disp: 90 tablet, Rfl: 3   metFORMIN  (GLUCOPHAGE ) 1000 MG tablet, Take 1 tablet (1,000 mg total) by mouth 2 (two) times daily with a meal., Disp: 180 tablet, Rfl: 3   NOVOLOG  FLEXPEN 100 UNIT/ML FlexPen, Inject 2-6 Units into the skin 3 (three) times daily with meals., Disp: 30 mL, Rfl: 1   pregabalin  (LYRICA ) 75 MG capsule, Take 1 capsule (75 mg total) by mouth 2 (two) times daily., Disp: 180 capsule, Rfl: 1   Semaglutide ,0.25 or 0.5MG /DOS, 2 MG/3ML SOPN, Inject 0.5 mg into the skin once a week., Disp: 9 mL, Rfl: 3   traMADol  (ULTRAM ) 50 MG tablet, Take 1-2 tablets (50-100 mg total) by mouth 3 (three) times daily as  needed., Disp: 30 tablet, Rfl: 0   Vitamin D , Ergocalciferol , (DRISDOL ) 1.25 MG (50000 UNIT) CAPS capsule, TAKE 1 CAPSULE BY MOUTH EVERY 7 DAYS, Disp: 4 capsule, Rfl: 6   atorvastatin  (LIPITOR) 40 MG tablet, Take 1 tablet (40 mg total) by mouth daily., Disp: 90 tablet, Rfl: 3   baclofen  (LIORESAL ) 10 MG tablet, Take 0.5-1 tablets (5-10 mg total) by mouth in the  morning and at bedtime., Disp: 180 each, Rfl: 1   DULoxetine  (CYMBALTA ) 60 MG capsule, Take 1 capsule (60 mg total) by mouth daily., Disp: 90 capsule, Rfl: 1   lisinopril  (ZESTRIL ) 10 MG tablet, Take 1 tablet (10 mg total) by mouth daily., Disp: 90 tablet, Rfl: 1   metoprolol  tartrate (LOPRESSOR ) 25 MG tablet, TAKE 1 TABLET(25 MG) BY MOUTH TWICE DAILY., Disp: 180 tablet, Rfl: 1   sertraline  (ZOLOFT ) 100 MG tablet, Take 1 tablet (100 mg total) by mouth daily., Disp: 90 tablet, Rfl: 1   zolpidem  (AMBIEN ) 5 MG tablet, TAKE 1 TABLET BY MOUTH AT BEDTIME FOR INSOMNIA (Patient not taking: Reported on 05/21/2023), Disp: 30 tablet, Rfl: 0  No Known Allergies  OBJECTIVE: BP 128/80   Pulse 85   Temp 98.2 F (36.8 C)   Wt 139 lb 3.2 oz (63.1 kg)   LMP  (LMP Unknown)   SpO2 97%   BMI 24.66 kg/m  Physical Exam Vitals and nursing note reviewed.  Constitutional:      General: She is not in acute distress.    Appearance: Normal appearance. She is not ill-appearing, toxic-appearing or diaphoretic.  HENT:     Head: Normocephalic and atraumatic.  Eyes:     General: No scleral icterus.       Right eye: No discharge.        Left eye: No discharge.     Extraocular Movements: Extraocular movements intact.     Conjunctiva/sclera: Conjunctivae normal.     Pupils: Pupils are equal, round, and reactive to light.  Cardiovascular:     Rate and Rhythm: Normal rate and regular rhythm.  Pulmonary:     Effort: Pulmonary effort is normal. No respiratory distress.     Breath sounds: Normal breath sounds. No wheezing, rhonchi or rales.  Musculoskeletal:      Right lower leg: No edema.     Left lower leg: No edema.  Skin:    General: Skin is warm.     Findings: No rash.  Neurological:     Mental Status: She is alert and oriented to person, place, and time. Mental status is at baseline.     Motor: No weakness.     Gait: Gait normal.  Psychiatric:        Mood and Affect: Mood normal.        Behavior: Behavior normal.        Thought Content: Thought content normal.        Judgment: Judgment normal.     ASSESSMENT AND PLAN: Priscilla [name] is a 73 y.o. female present for Chronic Conditions/illness Management Essential hypertension/palpitations/HLD/overweight Stable Continue metoprolol  25 mg daily Continue lisinopril  10 mg daily  Continue liptior  - low sodium. Routine exercise.  - con't baby ASA Labs: CBC, CMP, TSH and lipids collected today  Vitamin D  deficiency: Vitamin D -vitamin D  collected today Continue supplementation   Anxiety and depression/neuropathy/neck pain Stable Continue Cymbalta  60 mg daily Continue Zoloft  100 mg.   Continue lyrica  75 mg BID Continue baclofen  5-10 mg twice daily  type 2 diabetes mellitus with retinopathy, with long-term current use of insulin  (HCC) Managed by endocrinology.  A1c 0.1 04/2023 Urine microalb UTD 04/2023 Eye exam: UTD 12/2022 Influenza vaccine: UTD 2024 Pneumonia vaccine: Completed GFR: Collected today 05/21/2023  Orders Placed This Encounter  Procedures   Comp Met (CMET)   Lipid panel   IBC + Ferritin   CBC   Vitamin D  (25 hydroxy)   TSH  Meds ordered this encounter  Medications   baclofen  (LIORESAL ) 10 MG tablet    Sig: Take 0.5-1 tablets (5-10 mg total) by mouth in the morning and at bedtime.    Dispense:  180 each    Refill:  1   metoprolol  tartrate (LOPRESSOR ) 25 MG tablet    Sig: TAKE 1 TABLET(25 MG) BY MOUTH TWICE DAILY.    Dispense:  180 tablet    Refill:  1   pregabalin  (LYRICA ) 75 MG capsule    Sig: Take 1 capsule (75 mg total) by mouth 2 (two) times  daily.    Dispense:  180 capsule    Refill:  1   sertraline  (ZOLOFT ) 100 MG tablet    Sig: Take 1 tablet (100 mg total) by mouth daily.    Dispense:  90 tablet    Refill:  1   lisinopril  (ZESTRIL ) 10 MG tablet    Sig: Take 1 tablet (10 mg total) by mouth daily.    Dispense:  90 tablet    Refill:  1   DULoxetine  (CYMBALTA ) 60 MG capsule    Sig: Take 1 capsule (60 mg total) by mouth daily.    Dispense:  90 capsule    Refill:  1   atorvastatin  (LIPITOR) 40 MG tablet    Sig: Take 1 tablet (40 mg total) by mouth daily.    Dispense:  90 tablet    Refill:  3    Referral Orders  No referral(s) requested today     Napolean Backbone, DO 05/21/2023

## 2023-05-22 ENCOUNTER — Telehealth: Payer: Self-pay | Admitting: Family Medicine

## 2023-05-22 NOTE — Telephone Encounter (Signed)
LVM to discuss

## 2023-05-22 NOTE — Telephone Encounter (Signed)
Please call patient   Liver and kidney function is normal. Thyroid levels are normal. Blood cell counts and electrolytes are normal Cholesterol panel is at goal  Vitamin D levels are high at 91, at levels above 100, vitamin D can become toxic and cause fatigue, would recommend decreasing vitamin D dose by skipping 1-2 doses a week.  Iron levels are borderline, lower than in the past and may also be causing some of the fatigue..  Saturation levels are low.  Blood cell counts/hemoglobin is in normal range.   Please clarify with patient if she is currently taking an iron supplement, how many milligrams it is if so and how many times a week.  I will call her with guidance on iron supplementation after I receive this information (please add it to her medication management)  Thanks

## 2023-05-27 ENCOUNTER — Other Ambulatory Visit: Payer: Self-pay | Admitting: Hematology & Oncology

## 2023-05-27 DIAGNOSIS — E559 Vitamin D deficiency, unspecified: Secondary | ICD-10-CM

## 2023-05-28 ENCOUNTER — Encounter: Payer: Self-pay | Admitting: Hematology & Oncology

## 2023-05-29 ENCOUNTER — Other Ambulatory Visit: Payer: Self-pay

## 2023-05-30 ENCOUNTER — Encounter: Payer: Self-pay | Admitting: Family Medicine

## 2023-05-30 NOTE — Telephone Encounter (Signed)
 No further action needed.

## 2023-06-04 ENCOUNTER — Telehealth: Payer: Self-pay | Admitting: Family Medicine

## 2023-06-04 ENCOUNTER — Ambulatory Visit (INDEPENDENT_AMBULATORY_CARE_PROVIDER_SITE_OTHER): Payer: PPO | Admitting: Family Medicine

## 2023-06-04 ENCOUNTER — Encounter: Payer: Self-pay | Admitting: Family Medicine

## 2023-06-04 VITALS — BP 116/74 | HR 84 | Temp 97.9°F | Wt 138.7 lb

## 2023-06-04 DIAGNOSIS — R112 Nausea with vomiting, unspecified: Secondary | ICD-10-CM

## 2023-06-04 DIAGNOSIS — R1011 Right upper quadrant pain: Secondary | ICD-10-CM | POA: Diagnosis not present

## 2023-06-04 DIAGNOSIS — E1142 Type 2 diabetes mellitus with diabetic polyneuropathy: Secondary | ICD-10-CM | POA: Diagnosis not present

## 2023-06-04 DIAGNOSIS — R197 Diarrhea, unspecified: Secondary | ICD-10-CM

## 2023-06-04 DIAGNOSIS — Z9884 Bariatric surgery status: Secondary | ICD-10-CM | POA: Diagnosis not present

## 2023-06-04 LAB — CBC WITH DIFFERENTIAL/PLATELET
Basophils Absolute: 0.1 10*3/uL (ref 0.0–0.1)
Basophils Relative: 0.9 % (ref 0.0–3.0)
Eosinophils Absolute: 0.2 10*3/uL (ref 0.0–0.7)
Eosinophils Relative: 2.8 % (ref 0.0–5.0)
HCT: 42.6 % (ref 36.0–46.0)
Hemoglobin: 13.8 g/dL (ref 12.0–15.0)
Lymphocytes Relative: 22.2 % (ref 12.0–46.0)
Lymphs Abs: 1.7 10*3/uL (ref 0.7–4.0)
MCHC: 32.5 g/dL (ref 30.0–36.0)
MCV: 88.8 fL (ref 78.0–100.0)
Monocytes Absolute: 0.6 10*3/uL (ref 0.1–1.0)
Monocytes Relative: 7.7 % (ref 3.0–12.0)
Neutro Abs: 5 10*3/uL (ref 1.4–7.7)
Neutrophils Relative %: 66.4 % (ref 43.0–77.0)
Platelets: 316 10*3/uL (ref 150.0–400.0)
RBC: 4.79 Mil/uL (ref 3.87–5.11)
RDW: 13 % (ref 11.5–15.5)
WBC: 7.5 10*3/uL (ref 4.0–10.5)

## 2023-06-04 LAB — COMPREHENSIVE METABOLIC PANEL
ALT: 11 U/L (ref 0–35)
AST: 14 U/L (ref 0–37)
Albumin: 4.3 g/dL (ref 3.5–5.2)
Alkaline Phosphatase: 63 U/L (ref 39–117)
BUN: 23 mg/dL (ref 6–23)
CO2: 29 meq/L (ref 19–32)
Calcium: 9.4 mg/dL (ref 8.4–10.5)
Chloride: 100 meq/L (ref 96–112)
Creatinine, Ser: 1.26 mg/dL — ABNORMAL HIGH (ref 0.40–1.20)
GFR: 42.27 mL/min — ABNORMAL LOW (ref >60.00)
Glucose, Bld: 133 mg/dL — ABNORMAL HIGH (ref 70–99)
Potassium: 4.9 meq/L (ref 3.5–5.1)
Sodium: 139 meq/L (ref 135–145)
Total Bilirubin: 0.5 mg/dL (ref 0.2–1.2)
Total Protein: 6.7 g/dL (ref 6.0–8.3)

## 2023-06-04 LAB — LIPASE: Lipase: 14 U/L (ref 11.0–59.0)

## 2023-06-04 MED ORDER — ONDANSETRON 4 MG PO TBDP: 4.0000 mg | ORAL_TABLET | Freq: Three times a day (TID) | ORAL | 5 refills | Status: DC | PRN

## 2023-06-04 NOTE — Progress Notes (Signed)
 Sent message, via epic in basket, requesting orders in epic from Careers adviser.

## 2023-06-04 NOTE — Progress Notes (Signed)
 Priscilla [name] , [birth year], 73 y.o., female MRN: [id number] Patient Care Team    Relationship Specialty Notifications Start End  Natalia Leatherwood, DO PCP - General Family Medicine  08/29/21   Cammy Copa, MD Consulting Physician Orthopedic Surgery  08/06/15   Carlus Pavlov, MD Consulting Physician Internal Medicine  08/06/15    Comment: endocrine  Kathryne Hitch, MD Consulting Physician Orthopedic Surgery  08/06/15   Pollyann Savoy, MD Consulting Physician Rheumatology  08/06/15   Josph Macho, MD Consulting Physician Oncology  11/10/16   Tyrell Antonio, MD Consulting Physician Physical Medicine and Rehabilitation  11/10/16   Dermatology, [facility]    11/10/16    Comment: Dr. [place]  Hyatt, Annye Rusk, [facility] Consulting Physician Podiatry  12/03/18   Tat, Octaviano Batty, DO Consulting Physician Neurology  08/25/21     Chief Complaint  Patient presents with   Abdominal Pain    A couple weeks, URQ; pain, diarrhea, nausea. Pt has taken Pepto Bismol and Zofran.      Subjective: Priscilla [name] is a 73 y.o. Pt presents for an OV with her daughter with complaints of abdominal pain.  Patient states that the right upper quadrant abdominal pain started 05/29/2023.  She sat up and feels like she strained the area surrounding her Lap-Band.  Her Lap-Band is greater than 69 years old.  She states she is easily able to palpate her Lap-Band now and it feels very different from prior to onset of the symptoms.  The day after onset of pain, she states there was a soreness all around the area surrounding her Lap-Band. She has had nausea, vomiting and diarrhea that has worsened since last Tuesday, but she admits that has been present for the last couple weeks.  She states she did start the Ozempic back mid January.  She is currently taking Ozempic 0.5 mg weekly injection, last injection last Wednesday.  She is under the care of endocrinology for her diabetes. She denies fevers or chills.   She denies tarry stools. Patient has had a Lap-Band procedure, hysterectomy and a cholecystectomy in the past.  She has been using Zofran to help with the nausea and Pepto-Bismol.      03/21/2023    2:36 PM 03/15/2022    3:02 PM 10/21/2021    9:31 AM 08/15/2021   10:08 AM 02/23/2021    8:18 AM  Depression screen PHQ 2/9  Decreased Interest 0 0 1 1 0  Down, Depressed, Hopeless 3 0 1 1 1   PHQ - 2 Score 3 0 2 2 1   Altered sleeping 0  0    Tired, decreased energy 3  3    Change in appetite 0  0    Feeling bad or failure about yourself  0  0    Trouble concentrating 1  0    Moving slowly or fidgety/restless 0  0    Suicidal thoughts 0  0    PHQ-9 Score 7  5    Difficult doing work/chores Somewhat difficult        No Known Allergies Social History   Social History Narrative   Married, Brett Canales. 2 children name Genevie Cheshire and Victorino Dike.   Some college, retired Comptroller.   Drinks caffeine.   Take a daily vitamin.   Wears her seatbelt, exercises 3 times a week.   Smoke detector in the home.   Firearms in the home.   Feels safe in her relationships.  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 Alvester Morin   PTCA     WRIST SURGERY     LEFT   Family History  Problem Relation Age of Onset   Arthritis Mother    Alzheimer's disease Mother    Stroke Father    Alcohol abuse Father    Diabetes Father    Heart disease Father    Early death Father 22   Diabetes Sister    Arthritis Sister    COPD Sister    Arthritis Maternal Aunt    Diabetes Maternal Aunt    Early death Maternal Aunt 46   Diabetes Maternal Uncle    Cancer Paternal Aunt    COPD Paternal Aunt    Heart disease Paternal Aunt    Heart disease Paternal Uncle    Alcohol abuse Paternal Grandfather    Heart disease Paternal Grandfather    Early death Paternal Grandfather 10   Stroke Paternal Grandfather    Hypertension Son    Autoimmune disease Daughter    Breast cancer Neg Hx    Allergies as of 06/04/2023   No Known Allergies      Medication List        Accurate as of June 04, 2023 12:17 PM. If you have any questions, ask your nurse or doctor.          STOP taking these medications    zolpidem 5 MG tablet Commonly known as: AMBIEN Stopped by: Felix Pacini       TAKE these medications    aspirin 81 MG tablet Take 81 mg by mouth daily.   atorvastatin 40 MG tablet Commonly known as: LIPITOR Take 1 tablet (40 mg total) by mouth daily.   baclofen 10 MG tablet Commonly known as: LIORESAL Take 0.5-1 tablets (5-10 mg total) by mouth in the morning and at bedtime.   BD Pen Needle Nano 2nd Gen 32G X 4 MM Misc Generic drug: Insulin Pen Needle USE AS DIRECTED THREE TIMES DAILY FOR INSULIN   calcium-vitamin D 250-100 MG-UNIT tablet Take 1 tablet by mouth daily.   clobetasol 0.05 % external solution Commonly known as: TEMOVATE Apply 1 Application topically 2 (two) times daily.   COLLAGEN PO Take by mouth. Adds to tea daily   DULoxetine 60 MG  capsule Commonly known as: CYMBALTA Take 1 capsule (60 mg total) by mouth daily.   fluticasone 50 MCG/ACT nasal spray Commonly known as: FLONASE Place 2 sprays into both nostrils in the morning and at  bedtime.   FreeStyle Libre 3 Plus Sensor Misc 1 each by Does not apply route every 14 (fourteen) days.   Insulin Syringe-Needle U-100 31G X 5/16" 0.5 ML Misc Commonly known as: BD Insulin Syringe U/F Use 1x a day for insulin   ketoconazole 2 % cream Commonly known as: NIZORAL APPLY TOPICALLY TO THE AFFECTED AREA TWICE DAILY   Lantus SoloStar 100 UNIT/ML Solostar Pen Generic drug: insulin glargine Inject 12-15 Units into the skin daily.   lisinopril 10 MG tablet Commonly known as: ZESTRIL Take 1 tablet (10 mg total) by mouth daily.   meloxicam 15 MG tablet Commonly known as: MOBIC TAKE 1 TABLET(15 MG) BY MOUTH DAILY   metFORMIN 1000 MG tablet Commonly known as: GLUCOPHAGE Take 1 tablet (1,000 mg total) by mouth 2 (two) times daily with a meal.   metoprolol tartrate 25 MG tablet Commonly known as: LOPRESSOR TAKE 1 TABLET(25 MG) BY MOUTH TWICE DAILY.   NovoLOG FlexPen 100 UNIT/ML FlexPen Generic drug: insulin aspart Inject 2-6 Units into the skin 3 (three) times daily with meals.   ondansetron 4 MG disintegrating tablet Commonly known as: ZOFRAN-ODT Take 1 tablet (4 mg total) by mouth every 8 (eight) hours as needed for nausea or vomiting. Started by: Felix Pacini   OneTouch Verio test strip Generic drug: glucose blood USE TO TEST BLOOD SUGAR TWICE A DAY DX: E11.319   pregabalin 75 MG capsule Commonly known as: Lyrica Take 1 capsule (75 mg total) by mouth 2 (two) times daily.   Semaglutide(0.25 or 0.5MG /DOS) 2 MG/3ML Sopn Inject 0.5 mg into the skin once a week.   sertraline 100 MG tablet Commonly known as: ZOLOFT Take 1 tablet (100 mg total) by mouth daily.   traMADol 50 MG tablet Commonly known as: ULTRAM Take 1-2 tablets (50-100 mg total) by mouth 3  (three) times daily as needed.   VITAMIN B12 PO Place under the tongue.   Vitamin D (Ergocalciferol) 1.25 MG (50000 UNIT) Caps capsule Commonly known as: DRISDOL TAKE 1 CAPSULE BY MOUTH EVERY 7 DAYS        All past medical history, surgical history, allergies, family history, immunizations andmedications were updated in the EMR today and reviewed under the history and medication portions of their EMR.     ROS Negative, with the exception of above mentioned in HPI   Objective:  BP 116/74   Pulse 84   Temp 97.9 F (36.6 C)   Wt 138 lb 11.2 oz (62.9 kg)   LMP  (LMP Unknown)   SpO2 96%   BMI 24.57 kg/m  Body mass index is 24.57 kg/m. Physical Exam Vitals and nursing note reviewed.  Constitutional:      General: She is not in acute distress.    Appearance: Normal appearance. She is normal weight. She is not ill-appearing or toxic-appearing.     Comments: fatigued  HENT:     Head: Normocephalic and atraumatic.  Eyes:     General: No scleral icterus.       Right eye: No discharge.        Left eye: No discharge.     Extraocular Movements: Extraocular movements intact.     Conjunctiva/sclera: Conjunctivae normal.     Pupils: Pupils are equal, round, and reactive to light.  Cardiovascular:     Rate and Rhythm: Normal rate and regular rhythm.  Pulmonary:     Effort: Pulmonary effort is normal.  Abdominal:     General: Abdomen is flat. A surgical scar  is present. Bowel sounds are normal. There is no distension.     Palpations: Abdomen is soft. There is mass (palpable lap band with discomfort.). There is no hepatomegaly.     Tenderness: There is abdominal tenderness in the right upper quadrant. There is guarding. There is no rebound. Negative signs include Murphy's sign and McBurney's sign.     Hernia: No hernia is present.  Skin:    Findings: No rash.  Neurological:     Mental Status: She is alert and oriented to person, place, and time. Mental status is at baseline.      Motor: No weakness.     Coordination: Coordination normal.     Gait: Gait normal.  Psychiatric:        Mood and Affect: Mood normal.        Behavior: Behavior normal.        Thought Content: Thought content normal.        Judgment: Judgment normal.      No results found. No results found. No results found. However, due to the size of the patient record, not all encounters were searched. Please check Results Review for a complete set of results.  Assessment/Plan: Priscilla [name] is a 73 y.o. female present for OV for  RUQ pain/Hx of laparoscopic gastric banding/Nausea and vomiting, unspecified vomiting type/Diarrhea, unspecified type Concern lap band malfunctioned.  Ozempic may be causing the longer term nausea and vomit symptoms.will have her hold off on ozempic use for now.  -zofran for nausea - hydrate small sips ok, but needs to increase amount daily.  - Comp Met (CMET) - Lipase - CBC w/Diff - CT ABDOMEN PELVIS W CONTRAST; Future>STAT  Diabetic peripheral neuropathy associated with type 2 diabetes mellitus (HCC) Stop ozempic for now. May be causing SE of nausea, vomit and diarrhea.    Reviewed expectations re: course of current medical issues. Discussed self-management of symptoms. Outlined signs and symptoms indicating need for more acute intervention. Patient verbalized understanding and all questions were answered. Patient received an After-Visit Summary.    Orders Placed This Encounter  Procedures   CT ABDOMEN PELVIS W CONTRAST   Comp Met (CMET)   Lipase   CBC w/Diff   Meds ordered this encounter  Medications   ondansetron (ZOFRAN-ODT) 4 MG disintegrating tablet    Sig: Take 1 tablet (4 mg total) by mouth every 8 (eight) hours as needed for nausea or vomiting.    Dispense:  20 tablet    Refill:  5   Referral Orders  No referral(s) requested today   45 minutes spent in encounter on day of service providing urgent workup with stat labs and stat  abdominal CT for acute abdominal pain.  Note is dictated utilizing voice recognition software. Although note has been proof read prior to signing, occasional typographical errors still can be missed. If any questions arise, please do not hesitate to call for verification.   electronically signed by:  Felix Pacini, DO  [facility] [place]

## 2023-06-04 NOTE — Telephone Encounter (Signed)
 Please inform patient Priscilla [name] pancreatic enzymes and blood cell counts are normal. Priscilla [name] liver function tests and electrolyte levels are normal. Priscilla [name] creatinine is elevated at 1.26, was 0.76.  This is a decrease in Priscilla [name] kidney function, likely related to not drinking enough fluids.   -We discussed increasing Priscilla [name] fluid consumption today.  This is needed for adequate kidney health.   We will call Priscilla [name] with the CT abdomen results once we receive them and discuss further management.

## 2023-06-04 NOTE — Telephone Encounter (Signed)
 Pt given results/recommendations.

## 2023-06-05 ENCOUNTER — Ambulatory Visit
Admission: RE | Admit: 2023-06-05 | Discharge: 2023-06-05 | Disposition: A | Discharge status: Home / Self Care | Payer: PPO | Source: Ambulatory Visit | Attending: Family Medicine | Admitting: Family Medicine

## 2023-06-05 ENCOUNTER — Telehealth: Payer: Self-pay | Admitting: Family Medicine

## 2023-06-05 DIAGNOSIS — Z9884 Bariatric surgery status: Secondary | ICD-10-CM

## 2023-06-05 DIAGNOSIS — R1011 Right upper quadrant pain: Secondary | ICD-10-CM

## 2023-06-05 MED ORDER — IOPAMIDOL (ISOVUE-300) INJECTION 61%
80.0000 mL | Freq: Once | INTRAVENOUS | Status: AC | PRN
  Administered 2023-06-05: 80 mL via INTRAVENOUS

## 2023-06-05 NOTE — Telephone Encounter (Signed)
 Please call patient Abdominal CT results are overall reassuring bladder, appendix, kidney,spleen, pancreatic and liver are all normal No evidence of hernia  Her gastric lap band is reportedly in good position   I would encourage her to speak to her endocrinologist about the Ozempic use.  If she notices her symptoms are improving over the next few weeks, since stopping the Ozempic, then it very well could be causing all of her symptoms since it can delay gastric emptying, because bowel habit changes nausea and vomiting.

## 2023-06-05 NOTE — Telephone Encounter (Signed)
 LVM to discuss

## 2023-06-09 NOTE — Patient Instructions (Addendum)
 SURGICAL WAITING ROOM VISITATION Patients having surgery or a procedure may have no more than 2 support people in the waiting area - these visitors may rotate in the visitor waiting room.   Due to an increase in RSV and influenza rates and associated hospitalizations, children ages 63 and under may not visit patients in [facility] hospitals. If the patient needs to stay at the hospital during part of their recovery, the visitor guidelines for inpatient rooms apply.  PRE-OP VISITATION  Pre-op nurse will coordinate an appropriate time for 1 support person to accompany the patient in pre-op.  This support person may not rotate.  This visitor will be contacted when the time is appropriate for the visitor to come back in the pre-op area.  Please refer to the [facility] website for the visitor guidelines for Inpatients (after your surgery is over and you are in a regular room).  You are not required to quarantine at this time prior to your surgery. However, you must do this: Hand Hygiene often Do NOT share personal items Notify your provider if you are in close contact with someone who has COVID or you develop fever 100.4 or greater, new onset of sneezing, cough, sore throat, shortness of breath or body aches.  If you test positive for Covid or have been in contact with anyone that has tested positive in the last 10 days please notify you surgeon.    Your procedure is scheduled on:  Friday  June 22, 2023  Report to [facility] Main Entrance: Leota Jacobsen entrance where the [employer] is available.   Report to admitting at: 10:00  AM  Call this number if you have any questions or problems the morning of surgery [phone]  DIABETIC MEDICATIONS:  Insulin glargine (Lantus)- day before surgery take 100% morning dose (15 units),   80% if evening 12 units???? DAY OF SURGERY- take 50% dose or 7 units  Do not eat food after Midnight the night prior to your surgery/procedure.  After  Midnight you may have the following liquids until  09:30 AM DAY OF SURGERY  Clear Liquid Diet Water Black Coffee (sugar ok, NO MILK/CREAM OR CREAMERS)  Tea (sugar ok, NO MILK/CREAM OR CREAMERS) regular and decaf                             Plain Jell-O  with no fruit (NO RED)                                           Fruit ices (not with fruit pulp, NO RED)                                     Popsicles (NO RED)                                                                  Juice: NO CITRUS JUICES: only apple, WHITE grape, WHITE cranberry Sports drinks like Gatorade or Powerade (NO RED)  The day of surgery:  Drink ONE (1) Pre-Surgery G2 at  09:30  AM the morning of surgery. Drink in one sitting. Do not sip.  This drink was given to you during your hospital pre-op appointment visit. Nothing else to drink after completing the Pre-Surgery  G2 : No candy, chewing gum or throat lozenges.    FOLLOW ANY ADDITIONAL PRE OP INSTRUCTIONS YOU RECEIVED FROM YOUR SURGEON'S OFFICE!!!   Oral Hygiene is also important to reduce your risk of infection.        Remember - BRUSH YOUR TEETH THE MORNING OF SURGERY WITH YOUR REGULAR TOOTHPASTE  Do NOT smoke after Midnight the night before surgery.  STOP TAKING all Vitamins, Herbs and supplements 1 week before your surgery.   Take ONLY these medicines the morning of surgery with A SIP OF WATER: Metoprolol, sertraline, duloxetine, and you may use your Flonase nasal spray if needed.   If You have been diagnosed with Sleep Apnea - Bring CPAP mask and tubing day of surgery. We will provide you with a CPAP machine on the day of your surgery.                   You may not have any metal on your body including hair pins, jewelry, and body piercing  Do not wear make-up, lotions, powders, perfumes or deodorant  Do not wear nail polish including gel and S&S, artificial / acrylic nails, or any other type of covering on natural nails including  finger and toenails. If you have artificial nails, gel coating, etc., that needs to be removed by a nail salon, Please have this removed prior to surgery. Not doing so may mean that your surgery could be cancelled or delayed if the Surgeon or anesthesia staff feels like they are unable to monitor you safely.   Do not shave 48 hours prior to surgery to avoid nicks in your skin which may contribute to postoperative infections.   Contacts, Hearing Aids, dentures or bridgework may not be worn into surgery. DENTURES WILL BE REMOVED PRIOR TO SURGERY PLEASE DO NOT APPLY "Poly grip" OR ADHESIVES!!!  You may bring a small overnight bag with you on the day of surgery, only pack items that are not valuable. [facility] IS NOT RESPONSIBLE   FOR VALUABLES THAT ARE LOST OR STOLEN.   Do not bring your home medications to the hospital. The Pharmacy will dispense medications listed on your medication list to you during your admission in the Hospital.  Special Instructions: Bring a copy of your healthcare power of attorney and living will documents the day of surgery, if you wish to have them scanned into your [facility] Records- EPIC  Please read over the following fact sheets you were given: IF YOU HAVE QUESTIONS ABOUT YOUR PRE-OP INSTRUCTIONS, PLEASE CALL [phone].     Pre-operative 5 CHG Bath Instructions   You can play a key role in reducing the risk of infection after surgery. Your skin needs to be as free of germs as possible. You can reduce the number of germs on your skin by washing with CHG (chlorhexidine gluconate) soap before surgery. CHG is an antiseptic soap that kills germs and continues to kill germs even after washing.   DO NOT use if you have an allergy to chlorhexidine/CHG or antibacterial soaps. If your skin becomes reddened or irritated, stop using the CHG and notify one of our RNs at [phone]  Please shower with the CHG soap starting 4 days before  surgery using the  following schedule: START SHOWERS ON   Monday  June 18, 2023                                                                                                                                                                              Please keep in mind the following:  DO NOT shave, including legs and underarms, starting the day of your first shower.   You may shave your face at any point before/day of surgery.   Place clean sheets on your bed the day you start using CHG soap. Use a clean washcloth (not used since being washed) for each shower. DO NOT sleep with pets once you start using the CHG.   CHG Shower Instructions:  If you choose to wash your hair and private area, wash first with your normal shampoo/soap.  After you use shampoo/soap, rinse your hair and body thoroughly to remove shampoo/soap residue.  Turn the water OFF and apply about 3 tablespoons (45 ml) of CHG soap to a CLEAN washcloth.  Apply CHG soap ONLY FROM YOUR NECK DOWN TO YOUR TOES (washing for 3-5 minutes)  DO NOT use CHG soap on face, private areas, open wounds, or sores.  Pay special attention to the area where your surgery is being performed.  If you are having back surgery, having someone wash your back for you may be helpful.  Wait 2 minutes after CHG soap is applied, then you may rinse off the CHG soap.  Pat dry with a clean towel  Put on clean clothes/pajamas   If you choose to wear lotion, please use ONLY the CHG-compatible lotions on the back of this paper.     Additional instructions for the day of surgery: DO NOT APPLY any lotions, deodorants, cologne, or perfumes.   Put on clean/comfortable clothes.  Brush your teeth.  Ask your nurse before applying any prescription medications to the skin.      CHG Compatible Lotions   Aveeno Moisturizing lotion  Cetaphil Moisturizing Cream  Cetaphil Moisturizing Lotion  Clairol Herbal Essence Moisturizing Lotion, Dry Skin  Clairol Herbal Essence  Moisturizing Lotion, Extra Dry Skin  Clairol Herbal Essence Moisturizing Lotion, Normal Skin  Curel Age Defying Therapeutic Moisturizing Lotion with Alpha Hydroxy  Curel Extreme Care Body Lotion  Curel Soothing Hands Moisturizing Hand Lotion  Curel Therapeutic Moisturizing Cream, Fragrance-Free  Curel Therapeutic Moisturizing Lotion, Fragrance-Free  Curel Therapeutic Moisturizing Lotion, Original Formula  Eucerin Daily Replenishing Lotion  Eucerin Dry Skin Therapy Plus Alpha Hydroxy Crme  Eucerin Dry Skin Therapy Plus Alpha Hydroxy Lotion  Eucerin Original Crme  Eucerin Original Lotion  Eucerin Plus Crme Eucerin Plus Lotion  Eucerin TriLipid Replenishing Lotion  Keri Anti-Bacterial Hand Lotion  Keri Deep Conditioning Original Lotion Dry Skin Formula Softly Scented  Keri Deep Conditioning Original Lotion, Fragrance Free Sensitive Skin Formula  Keri Lotion Fast Absorbing Fragrance Free Sensitive Skin Formula  Keri Lotion Fast Absorbing Softly Scented Dry Skin Formula  Keri Original Lotion  Keri Skin Renewal Lotion Keri Silky Smooth Lotion  Keri Silky Smooth Sensitive Skin Lotion  Nivea Body Creamy Conditioning Oil  Nivea Body Extra Enriched Lotion  Nivea Body Original Lotion  Nivea Body Sheer Moisturizing Lotion Nivea Crme  Nivea Skin Firming Lotion  NutraDerm 30 Skin Lotion  NutraDerm Skin Lotion  NutraDerm Therapeutic Skin Cream  NutraDerm Therapeutic Skin Lotion  ProShield Protective Hand Cream  Provon moisturizing lotion   FAILURE TO FOLLOW THESE INSTRUCTIONS MAY RESULT IN THE CANCELLATION OF YOUR SURGERY  PATIENT SIGNATURE_________________________________  NURSE SIGNATURE__________________________________  ________________________________________________________________________       Rogelia Mire    An incentive spirometer is a tool that can help keep your lungs clear and active. This tool measures how well you are filling your lungs with each  breath. Taking long deep breaths may help reverse or decrease the chance of developing breathing (pulmonary) problems (especially infection) following: A long period of time when you are unable to move or be active. BEFORE THE PROCEDURE  If the spirometer includes an indicator to show your best effort, your nurse or respiratory therapist will set it to a desired goal. If possible, sit up straight or lean slightly forward. Try not to slouch. Hold the incentive spirometer in an upright position. INSTRUCTIONS FOR USE  Sit on the edge of your bed if possible, or sit up as far as you can in bed or on a chair. Hold the incentive spirometer in an upright position. Breathe out normally. Place the mouthpiece in your mouth and seal your lips tightly around it. Breathe in slowly and as deeply as possible, raising the piston or the ball toward the top of the column. Hold your breath for 3-5 seconds or for as long as possible. Allow the piston or ball to fall to the bottom of the column. Remove the mouthpiece from your mouth and breathe out normally. Rest for a few seconds and repeat Steps 1 through 7 at least 10 times every 1-2 hours when you are awake. Take your time and take a few normal breaths between deep breaths. The spirometer may include an indicator to show your best effort. Use the indicator as a goal to work toward during each repetition. After each set of 10 deep breaths, practice coughing to be sure your lungs are clear. If you have an incision (the cut made at the time of surgery), support your incision when coughing by placing a pillow or rolled up towels firmly against it. Once you are able to get out of bed, walk around indoors and cough well. You may stop using the incentive spirometer when instructed by your caregiver.  RISKS AND COMPLICATIONS Take your time so you do not get dizzy or light-headed. If you are in pain, you may need to take or ask for pain medication before doing incentive  spirometry. It is harder to take a deep breath if you are having pain. AFTER USE Rest and breathe slowly and easily. It can be helpful to keep track of a log of your progress. Your caregiver can provide you with a simple table to help with this. If you are using the spirometer at home, follow these instructions: SEEK MEDICAL CARE IF:  You are having difficultly using the spirometer. You have trouble using the spirometer as often as instructed. Your pain medication is not giving enough relief while using the spirometer. You develop fever of 100.5 F (38.1 C) or higher.                                                                                                    SEEK IMMEDIATE MEDICAL CARE IF:  You cough up bloody sputum that had not been present before. You develop fever of 102 F (38.9 C) or greater. You develop worsening pain at or near the incision site. MAKE SURE YOU:  Understand these instructions. Will watch your condition. Will get help right away if you are not doing well or get worse. Document Released: 08/07/2006 Document Revised: 06/19/2011 Document Reviewed: 10/08/2006 [facility] Patient Information 2014 [place], [place].         If you would like to see a video about joint replacement:   [email or web address]

## 2023-06-09 NOTE — Progress Notes (Signed)
 COVID Vaccine received:  []  No [x]  Yes Date of any COVID positive Test in last 90 days:  PCP - Felix Pacini, DO  [facility] Cardiologist - Eleonore Chiquito, MD  (LOV  02-07-2013) Rheumatology- Pollyann Savoy, MD  Endocrinology- Carlus Pavlov, MD   Chest x-ray - 03-15-2021  2v  Epic EKG -  04-23-2016    Will Repeat Stress Test - 08-08-2010  Epic ECHO - 04-13-2016  Epic Cardiac Cath - 10-02-2002 By Yates Decamp, MD,  Normal Coronaries  PCR screen: [x]  Ordered & Completed []   No Order but Needs PROFEND     []   N/A for this surgery  Surgery Plan:  []  Ambulatory   [x]  Outpatient in bed  []  Admit Anesthesia:    []  General  [x]  Spinal  []   Choice []   MAC  Pacemaker / ICD device [x]  No []  Yes   Spinal Cord Stimulator:[x]  No []  Yes       History of Sleep Apnea? []  No [x]  Yes   CPAP used?- []  No []  Yes    Does the patient monitor blood sugar?   []  N/A   []  No []  Yes  Patient has: []  NO Hx DM   []  Pre-DM   []  DM1  [x]   DM2 Last A1c was: 7.1 on  05-07-2023    Does patient have a [employer] or Dexcom? []  No [x]  Yes   Fasting Blood Sugar Ranges-  Checks Blood Sugar _____ times a day  GLP1 agonist / usual dose - stopped taking Ozempic d/t side effects  Other Diabetic medications/ instructions:  Novolog Pen - on hold,    Insulin glargine (Lantus)  15 units daily.   Blood Thinner / Instructions:  none Aspirin Instructions:  ASA 81 mg -  not taking  ERAS Protocol Ordered: []  No  [x]  Yes PRE-SURGERY []  ENSURE  [x]  G2  Patient is to be NPO after: 0930  Dental hx: []  Dentures:  []  N/A      []  Bridge or Partial:                   []  Loose or Damaged teeth:   Comments: Patient was given the 5 CHG shower / bath instructions for THA surgery along with 2 bottles of the CHG soap. Patient will start this on:  06-18-2023  All questions were asked and answered, Patient voiced understanding of this process.   Activity level: Patient is able / unable to climb a flight of stairs without difficulty; []   No CP  []  No SOB, but would have ___   Patient can / can not perform ADLs without assistance.   Anesthesia review: HTN, DM2, anxiety/ depression, Psoriatic arthritis, sinus tachycardia, Palps, Had Cath 2004 (Noncardiac CP),CKD3, s/p gastric lap band, anemia, OSA-  Patient denies shortness of breath, fever, cough and chest pain at PAT appointment.  Patient verbalized understanding and agreement to the Pre-Surgical Instructions that were given to them at this PAT appointment. Patient was also educated of the need to review these PAT instructions again prior to her surgery.I reviewed the appropriate phone numbers to call if they have any and questions or concerns.

## 2023-06-11 ENCOUNTER — Other Ambulatory Visit: Payer: Self-pay

## 2023-06-11 ENCOUNTER — Encounter (HOSPITAL_COMMUNITY): Payer: Self-pay

## 2023-06-11 ENCOUNTER — Encounter (HOSPITAL_COMMUNITY)
Admission: RE | Admit: 2023-06-11 | Discharge: 2023-06-11 | Disposition: A | Discharge status: Home / Self Care | Payer: PPO | Source: Ambulatory Visit | Attending: Orthopaedic Surgery | Admitting: Orthopaedic Surgery

## 2023-06-11 VITALS — BP 106/68 | HR 74 | Temp 98.9°F | Resp 16 | Ht 63.0 in | Wt 137.0 lb

## 2023-06-11 DIAGNOSIS — I10 Essential (primary) hypertension: Secondary | ICD-10-CM

## 2023-06-11 DIAGNOSIS — I129 Hypertensive chronic kidney disease with stage 1 through stage 4 chronic kidney disease, or unspecified chronic kidney disease: Secondary | ICD-10-CM | POA: Diagnosis not present

## 2023-06-11 DIAGNOSIS — G4733 Obstructive sleep apnea (adult) (pediatric): Secondary | ICD-10-CM | POA: Diagnosis not present

## 2023-06-11 DIAGNOSIS — F419 Anxiety disorder, unspecified: Secondary | ICD-10-CM | POA: Insufficient documentation

## 2023-06-11 DIAGNOSIS — J45909 Unspecified asthma, uncomplicated: Secondary | ICD-10-CM | POA: Diagnosis not present

## 2023-06-11 DIAGNOSIS — R1011 Right upper quadrant pain: Secondary | ICD-10-CM | POA: Insufficient documentation

## 2023-06-11 DIAGNOSIS — Z794 Long term (current) use of insulin: Secondary | ICD-10-CM | POA: Insufficient documentation

## 2023-06-11 DIAGNOSIS — M797 Fibromyalgia: Secondary | ICD-10-CM | POA: Diagnosis not present

## 2023-06-11 DIAGNOSIS — N182 Chronic kidney disease, stage 2 (mild): Secondary | ICD-10-CM | POA: Insufficient documentation

## 2023-06-11 DIAGNOSIS — M25551 Pain in right hip: Secondary | ICD-10-CM

## 2023-06-11 DIAGNOSIS — E1122 Type 2 diabetes mellitus with diabetic chronic kidney disease: Secondary | ICD-10-CM | POA: Insufficient documentation

## 2023-06-11 DIAGNOSIS — Z01818 Encounter for other preprocedural examination: Secondary | ICD-10-CM | POA: Diagnosis not present

## 2023-06-11 DIAGNOSIS — E119 Type 2 diabetes mellitus without complications: Secondary | ICD-10-CM

## 2023-06-11 DIAGNOSIS — Z7984 Long term (current) use of oral hypoglycemic drugs: Secondary | ICD-10-CM | POA: Diagnosis not present

## 2023-06-11 DIAGNOSIS — M1611 Unilateral primary osteoarthritis, right hip: Secondary | ICD-10-CM | POA: Diagnosis not present

## 2023-06-11 DIAGNOSIS — F32A Depression, unspecified: Secondary | ICD-10-CM | POA: Diagnosis not present

## 2023-06-11 HISTORY — DX: Bariatric surgery status: Z98.84

## 2023-06-11 HISTORY — DX: Chronic kidney disease, unspecified: N18.9

## 2023-06-11 HISTORY — DX: Cardiac arrhythmia, unspecified: I49.9

## 2023-06-11 LAB — TYPE AND SCREEN
ABO/RH(D): AB POS
Antibody Screen: NEGATIVE

## 2023-06-11 LAB — GLUCOSE, CAPILLARY: Glucose-Capillary: 117 mg/dL — ABNORMAL HIGH (ref 70–99)

## 2023-06-11 LAB — SURGICAL PCR SCREEN
MRSA, PCR: NEGATIVE
Staphylococcus aureus: POSITIVE — AB

## 2023-06-11 NOTE — Progress Notes (Signed)
 Patient's PCR screen is positive for STAPH. Appropriate notes have been placed on the patient's chart. This note has been routed to Dr.Blackman and Richardean Canal for review. The Patient's surgery is currently scheduled for:  06-22-2023 at [facility].  Rudean Haskell, BSN, CVRN-BC   Pre-Surgical Testing Nurse [facility]  [phone]

## 2023-06-12 ENCOUNTER — Other Ambulatory Visit (HOSPITAL_COMMUNITY): Payer: Self-pay

## 2023-06-12 ENCOUNTER — Encounter (HOSPITAL_COMMUNITY): Payer: Self-pay

## 2023-06-12 ENCOUNTER — Encounter: Payer: Self-pay | Admitting: Hematology & Oncology

## 2023-06-12 NOTE — Anesthesia Preprocedure Evaluation (Addendum)
 Anesthesia Evaluation  Patient identified by MRN, date of birth, ID band Patient awake    Reviewed: Allergy & Precautions, NPO status , Patient's Chart, lab work & pertinent test results  Airway Mallampati: II  TM Distance: >3 FB Neck ROM: Full    Dental no notable dental hx.    Pulmonary asthma , sleep apnea and Continuous Positive Airway Pressure Ventilation    Pulmonary exam normal        Cardiovascular hypertension, + dysrhythmias  Rhythm:Regular Rate:Normal     Neuro/Psych   Anxiety Depression    negative neurological ROS     GI/Hepatic negative GI ROS, Neg liver ROS,,,  Endo/Other  diabetes, Type 2, Insulin Dependent    Renal/GU CRFRenal disease  negative genitourinary   Musculoskeletal  (+) Arthritis ,  Fibromyalgia -  Abdominal Normal abdominal exam  (+)   Peds  Hematology  (+) Blood dyscrasia, anemia   Anesthesia Other Findings   Reproductive/Obstetrics                             Anesthesia Physical Anesthesia Plan  ASA: 2  Anesthesia Plan: MAC and Spinal   Post-op Pain Management:    Induction: Intravenous  PONV Risk Score and Plan: 2 and Propofol infusion, Treatment may vary due to age or medical condition, Ondansetron and Dexamethasone  Airway Management Planned: Simple Face Mask and Nasal Cannula  Additional Equipment: None  Intra-op Plan:   Post-operative Plan:   Informed Consent: I have reviewed the patients History and Physical, chart, labs and discussed the procedure including the risks, benefits and alternatives for the proposed anesthesia with the patient or authorized representative who has indicated his/her understanding and acceptance.     Dental advisory given  Plan Discussed with: CRNA  Anesthesia Plan Comments: (See PAT note from 3/3 by Sherlie Ban PA-C )        Anesthesia Quick Evaluation

## 2023-06-12 NOTE — Progress Notes (Signed)
 Case: [id number] Date/Time: 06/22/23 1225   Procedure: RIGHT TOTAL HIP ARTHROPLASTY ANTERIOR APPROACH (Right: Hip)   Anesthesia type: Spinal   Pre-op diagnosis: osteoarthritis right hip   Location: [facility] ROOM 09 / [facility]   Surgeons: Kathryne Hitch, MD       DISCUSSION:  Priscilla [name] is a 73 yo female who presents to PAT prior to surgery above. PMH of asthma, OSA (no CPAP use), IDDM, s/p lap band surgery (2010), CKD, anxiety, depression, fibromyalgia, arthritis.  Patient has been evaluated by Cardiology in the past for CP/SOB, palpitations. She had a negative cath in 2004. Stress test in 2012 was low risk. Echo in 2018 WNL  Patient has hx of asthma. No inhaler use. Hx of OSA. No CPAP use. She has IDDM - last A1c was 7.1 on 05/07/23. Last seen by PCP on 06/04/23. She reported RUQ pain with N/V/D. Her ozempic was d/c'd and CT A/P was ordered which did not show any acute findings.  VS: BP 106/68 Comment: right arm sitting  Pulse 74   Temp 37.2 C   Resp 16   Ht 5\' 3"  (1.6 m)   Wt 62.1 kg   LMP  (LMP Unknown)   SpO2 98%   BMI 24.27 kg/m   PROVIDERS: Kuneff, Renee A, DO   LABS: Labs reviewed: Acceptable for surgery. (all labs ordered are listed, but only abnormal results are displayed)  Labs Reviewed  SURGICAL PCR SCREEN - Abnormal; Notable for the following components:      Result Value   Staphylococcus aureus POSITIVE (*)    All other components within normal limits  GLUCOSE, CAPILLARY - Abnormal; Notable for the following components:   Glucose-Capillary 117 (*)    All other components within normal limits  TYPE AND SCREEN     IMAGES:  CT A/P 06/05/23:    IMPRESSION: *No acute findings in the abdomen or pelvis. No evidence of appendicitis *Prior gastric lap band, lap band in good position. *Moderate amount of residual fecal material throughout the colon.   EKG 06/11/23:  Normal sinus rhythm, rate 95 Left anterior fascicular block Septal infarct , age  undetermined Abnormal ECG When compared with ECG of No significant change since last tracing  CV:  Echo 04/23/2016:  Study Conclusions   - Left ventricle: The cavity size was normal. Wall thickness was    increased increased in a pattern of mild to moderate LVH.    Systolic function was normal. The estimated ejection fraction was    in the range of 60% to 65%. Diastolic function is indeterminant.    Wall motion was normal; there were no regional wall motion    abnormalities.  - Aortic valve: Mildly calcified annulus. Trileaflet; normal    thickness leaflets. There was mild regurgitation. Valve area    (VTI): 2.35 cm^2. Valve area (Vmax): 2.69 cm^2. Valve area    (Vmean): 2.56 cm^2.  - Mitral valve: Mildly calcified annulus. Normal thickness leaflets    .  - Left atrium: The atrium was mildly dilated.   Stress test 08/08/2010:  Impression Exercise Capacity:  Good exercise capacity. BP Response:  Normal blood pressure response. Clinical Symptoms:  No chest pain. ECG Impression:  No significant ST segment change suggestive of ischemia. 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   Hyperlipemia 01/21/2010   Hypertension 12/14/2008   Leg edema 12/14/2008   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 Alvester Morin   WRIST SURGERY     LEFT    MEDICATIONS:  aspirin 81 MG tablet   atorvastatin (LIPITOR) 40 MG tablet   baclofen (LIORESAL) 10 MG tablet   BD PEN NEEDLE NANO 2ND GEN 32G X 4 MM MISC   COLLAGEN PO   Continuous Glucose Sensor (FREESTYLE LIBRE 3 PLUS SENSOR) MISC   DULoxetine (CYMBALTA) 60 MG capsule   fluticasone (FLONASE) 50 MCG/ACT nasal spray   glucose blood (ONETOUCH VERIO) test strip   insulin glargine (LANTUS SOLOSTAR) 100 UNIT/ML Solostar Pen   Insulin Syringe-Needle U-100 (BD INSULIN SYRINGE U/F) 31G X 5/16" 0.5 ML MISC   ketoconazole (NIZORAL) 2 % cream   lisinopril (ZESTRIL) 10 MG tablet   metFORMIN (GLUCOPHAGE) 1000 MG tablet   metoprolol tartrate (LOPRESSOR) 25 MG tablet   Multiple Vitamins-Minerals (HAIR SKIN AND NAILS FORMULA) TABS   NOVOLOG FLEXPEN 100 UNIT/ML FlexPen   ondansetron (ZOFRAN-ODT) 4 MG disintegrating tablet   Semaglutide,0.25 or 0.5MG /DOS, 2 MG/3ML SOPN   sertraline (ZOLOFT) 100 MG tablet   traMADol (ULTRAM) 50 MG tablet   Vitamin D, Ergocalciferol, (DRISDOL) 1.25 MG (50000 UNIT) CAPS capsule   No current facility-administered medications for this encounter.   [name] [facility] Surgical Short Stay/Anesthesiology [facility] Phone [phone] 06/12/2023 3:31 PM

## 2023-06-13 ENCOUNTER — Other Ambulatory Visit (HOSPITAL_COMMUNITY): Payer: Self-pay

## 2023-06-20 ENCOUNTER — Telehealth: Payer: Self-pay | Admitting: *Deleted

## 2023-06-20 DIAGNOSIS — M1611 Unilateral primary osteoarthritis, right hip: Secondary | ICD-10-CM | POA: Diagnosis not present

## 2023-06-20 NOTE — Care Plan (Signed)
 [facility] RNCM call to patient to discuss her upcoming Right total hip arthroplasty with Dr. Magnus Ivan on 06/22/23 at [facility]. She lives with her daughter, who will be assisting her after surgery. She will need a RW prior to discharge. Referral placed to [employer] for delivery of walker at hospital. Anticipate HHPT will be needed after a short hospital stay. Referral made to [facility]. Reviewed post op care instructions. Will continue to follow for needs.

## 2023-06-20 NOTE — Telephone Encounter (Signed)
 [facility] pre-op call completed.

## 2023-06-21 DIAGNOSIS — M1611 Unilateral primary osteoarthritis, right hip: Secondary | ICD-10-CM | POA: Insufficient documentation

## 2023-06-21 HISTORY — DX: Unilateral primary osteoarthritis, right hip: M16.11

## 2023-06-21 NOTE — H&P (Signed)
 TOTAL HIP ADMISSION H&P  Patient is admitted for right total hip arthroplasty.  Subjective:  Chief Complaint: right hip pain  HPI: Priscilla [name], 73 y.o. female, has a history of pain and functional disability in the right hip(s) due to arthritis and patient has failed non-surgical conservative treatments for greater than 12 weeks to include NSAID's and/or analgesics, flexibility and strengthening excercises, use of assistive devices, weight reduction as appropriate, and activity modification.  Onset of symptoms was gradual starting 3 years ago with gradually worsening course since that time.The patient noted no past surgery on the right hip(s).  Patient currently rates pain in the right hip at 10 out of 10 with activity. Patient has night pain, worsening of pain with activity and weight bearing, trendelenberg gait, pain that interfers with activities of daily living, and pain with passive range of motion. Patient has evidence of subchondral cysts, subchondral sclerosis, periarticular osteophytes, and joint space narrowing by imaging studies. This condition presents safety issues increasing the risk of falls.  There is no current active infection.  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   Hx of laparoscopic gastric banding 08/31/2016   Iron deficiency anemia 04/28/2016   Other insomnia 03/14/2016   DDD (degenerative disc disease), cervical 03/14/2016   Spondylosis without myelopathy or radiculopathy, lumbar region 01/31/2016   Anxiety and depression 06/25/2014   Psoriatic arthritis - sees rheumatologist 02/20/2014   Asthma 12/14/2008   Diabetic peripheral  neuropathy associated with type 2 diabetes mellitus (HCC) 08/06/2008   Hyperlipemia 11/12/2006   Essential hypertension 11/12/2006   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   Diabetes mellitus with neuropathy (HCC) 12/14/2008   Dysrhythmia    sinus tachycardia, palps   Fibromyalgia 01/04/2007   sees rheuamtology   Hx of laparoscopic gastric banding    2010   Hyperlipemia 01/21/2010   Hypertension 12/14/2008   Leg edema 12/14/2008   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 Alvester Morin   WRIST SURGERY     LEFT    No current facility-administered medications for this encounter.   Current Outpatient Medications  Medication Sig Dispense Refill Last Dose/Taking   atorvastatin (LIPITOR) 40 MG tablet Take 1 tablet (40 mg total) by mouth daily. 90 tablet 3 Taking   baclofen (LIORESAL) 10 MG tablet Take 0.5-1 tablets (5-10 mg total) by mouth in the morning and at bedtime. (Patient taking differently: Take 10 mg by mouth at bedtime.) 180 each 1 Taking  Differently   COLLAGEN PO Take 1 Scoop by mouth daily.   Taking   DULoxetine (CYMBALTA) 60 MG capsule Take 1 capsule (60 mg total) by mouth daily. 90 capsule 1 Taking   fluticasone (FLONASE) 50 MCG/ACT nasal spray Place 2 sprays into both nostrils in the morning and at bedtime. (Patient taking differently: Place 2  sprays into both nostrils 2 (two) times daily as needed for allergies.) 16 g 6 Taking Differently   insulin glargine (LANTUS SOLOSTAR) 100 UNIT/ML Solostar Pen Inject 12-15 Units into the skin daily. (Patient taking differently: Inject 15 Units into the skin daily.) 15 mL 5 Taking Differently   ketoconazole (NIZORAL) 2 % cream APPLY TOPICALLY TO THE AFFECTED AREA TWICE DAILY (Patient taking differently: Apply 1 Application topically 2 (two) times daily as needed for irritation.) 60 g 3 Taking Differently   lisinopril (ZESTRIL) 10 MG tablet Take 1 tablet (10 mg total) by mouth daily. 90 tablet 1 Taking   metFORMIN (GLUCOPHAGE) 1000 MG tablet Take 1 tablet (1,000 mg total) by mouth 2 (two) times daily with a meal. 180 tablet 3 Taking   metoprolol tartrate (LOPRESSOR) 25 MG tablet TAKE 1 TABLET(25 MG) BY MOUTH TWICE DAILY. 180 tablet 1 Taking   Multiple Vitamins-Minerals (HAIR SKIN AND NAILS FORMULA) TABS Take 2 tablets by mouth daily.   Taking   sertraline (ZOLOFT) 100 MG tablet Take 1 tablet (100 mg total) by mouth daily. 90 tablet 1 Taking   traMADol (ULTRAM) 50 MG tablet Take 1-2 tablets (50-100 mg total) by mouth 3 (three) times daily as needed. (Patient taking differently: Take 50 mg by mouth at bedtime.) 30 tablet 0 Taking Differently   Vitamin D, Ergocalciferol, (DRISDOL) 1.25 MG (50000 UNIT) CAPS capsule TAKE 1 CAPSULE BY MOUTH EVERY 7 DAYS 4 capsule 6 Taking   aspirin 81 MG tablet Take 81 mg by mouth daily.      BD PEN NEEDLE NANO 2ND GEN 32G X 4 MM MISC USE AS DIRECTED THREE TIMES DAILY FOR INSULIN 300 each 3    Continuous Glucose Sensor (FREESTYLE LIBRE 3 PLUS SENSOR) MISC 1 each by Does not apply route every 14 (fourteen) days. 6 each 3    glucose blood (ONETOUCH VERIO) test strip USE TO TEST BLOOD SUGAR TWICE A DAY DX: E11.319 150 each 3    Insulin Syringe-Needle U-100 (BD INSULIN SYRINGE U/F) 31G X 5/16" 0.5 ML MISC Use 1x a day for insulin 100 each 3    NOVOLOG FLEXPEN 100 UNIT/ML FlexPen  Inject 2-6 Units into the skin 3 (three) times daily with meals. 30 mL 1    ondansetron (ZOFRAN-ODT) 4 MG disintegrating tablet Take 1 tablet (4 mg total) by mouth every 8 (eight) hours as needed for nausea or vomiting. (Patient not taking: Reported on 06/07/2023) 20 tablet 5 Not Taking   Semaglutide,0.25 or 0.5MG /DOS, 2 MG/3ML SOPN Inject 0.5 mg into the skin once a week. (Patient not taking: Reported on 06/07/2023) 9 mL 3 Not Taking   No Known Allergies  Social History   Tobacco Use   Smoking status: Never    Passive exposure: Past   Smokeless tobacco: Never  Substance Use Topics   Alcohol use: Yes    Comment: glass of wine-special occasion    Family History  Problem Relation Age of Onset   Arthritis Mother    Alzheimer's disease Mother    Stroke Father    Alcohol abuse Father    Diabetes Father    Heart disease Father  Early death Father 59   Diabetes Sister    Arthritis Sister    COPD Sister    Arthritis Maternal Aunt    Diabetes Maternal Aunt    Early death Maternal Aunt 5   Diabetes Maternal Uncle    Cancer Paternal Aunt    COPD Paternal Aunt    Heart disease Paternal Aunt    Heart disease Paternal Uncle    Alcohol abuse Paternal Grandfather    Heart disease Paternal Grandfather    Early death Paternal Grandfather 29   Stroke Paternal Grandfather    Hypertension Son    Autoimmune disease Daughter    Breast cancer Neg Hx      Review of Systems  Objective:  Physical Exam Constitutional:      Appearance: Normal appearance. She is normal weight.  HENT:     Head: Normocephalic and atraumatic.  Eyes:     Extraocular Movements: Extraocular movements intact.     Pupils: Pupils are equal, round, and reactive to light.  Cardiovascular:     Rate and Rhythm: Normal rate.  Pulmonary:     Effort: Pulmonary effort is normal.     Breath sounds: Normal breath sounds.  Abdominal:     Palpations: Abdomen is soft.  Musculoskeletal:     Cervical back: Normal range of  motion and neck supple.     Right hip: Tenderness and bony tenderness present. Decreased range of motion. Decreased strength.  Neurological:     Mental Status: She is alert and oriented to person, place, and time.  Psychiatric:        Behavior: Behavior normal.     Vital signs in last 24 hours:    Labs:   Estimated body mass index is 24.27 kg/m as calculated from the following:   Height as of 06/11/23: 5\' 3"  (1.6 m).   Weight as of 06/11/23: 62.1 kg.   Imaging Review Plain radiographs demonstrate severe degenerative joint disease of the right hip(s). The bone quality appears to be good for age and reported activity level.      Assessment/Plan:  End stage arthritis, right hip(s)  The patient history, physical examination, clinical judgement of the provider and imaging studies are consistent with end stage degenerative joint disease of the right hip(s) and total hip arthroplasty is deemed medically necessary. The treatment options including medical management, injection therapy, arthroscopy and arthroplasty were discussed at length. The risks and benefits of total hip arthroplasty were presented and reviewed. The risks due to aseptic loosening, infection, stiffness, dislocation/subluxation,  thromboembolic complications and other imponderables were discussed.  The patient acknowledged the explanation, agreed to proceed with the plan and consent was signed. Patient is being admitted for inpatient treatment for surgery, pain control, PT, OT, prophylactic antibiotics, VTE prophylaxis, progressive ambulation and ADL's and discharge planning.The patient is planning to be discharged home with home health services

## 2023-06-22 ENCOUNTER — Ambulatory Visit (HOSPITAL_COMMUNITY): Payer: Self-pay | Admitting: Medical

## 2023-06-22 ENCOUNTER — Ambulatory Visit (HOSPITAL_COMMUNITY)

## 2023-06-22 ENCOUNTER — Other Ambulatory Visit: Payer: Self-pay

## 2023-06-22 ENCOUNTER — Observation Stay (HOSPITAL_COMMUNITY)

## 2023-06-22 ENCOUNTER — Ambulatory Visit (HOSPITAL_COMMUNITY): Admitting: Anesthesiology

## 2023-06-22 ENCOUNTER — Encounter (HOSPITAL_COMMUNITY): Payer: Self-pay | Admitting: Orthopaedic Surgery

## 2023-06-22 ENCOUNTER — Encounter (HOSPITAL_COMMUNITY)
Admission: RE | Disposition: A | Discharge status: Home Health | Payer: Self-pay | Source: Ambulatory Visit | Attending: Orthopaedic Surgery

## 2023-06-22 ENCOUNTER — Observation Stay (HOSPITAL_COMMUNITY)
Admission: RE | Admit: 2023-06-22 | Discharge: 2023-06-23 | Disposition: A | Discharge status: Home Health | Payer: PPO | Source: Ambulatory Visit | Attending: Orthopaedic Surgery | Admitting: Orthopaedic Surgery

## 2023-06-22 DIAGNOSIS — N183 Chronic kidney disease, stage 3 unspecified: Secondary | ICD-10-CM | POA: Diagnosis not present

## 2023-06-22 DIAGNOSIS — Z794 Long term (current) use of insulin: Secondary | ICD-10-CM | POA: Insufficient documentation

## 2023-06-22 DIAGNOSIS — Z7982 Long term (current) use of aspirin: Secondary | ICD-10-CM | POA: Diagnosis not present

## 2023-06-22 DIAGNOSIS — Z96641 Presence of right artificial hip joint: Secondary | ICD-10-CM | POA: Diagnosis not present

## 2023-06-22 DIAGNOSIS — E1122 Type 2 diabetes mellitus with diabetic chronic kidney disease: Secondary | ICD-10-CM | POA: Diagnosis not present

## 2023-06-22 DIAGNOSIS — Z01818 Encounter for other preprocedural examination: Secondary | ICD-10-CM

## 2023-06-22 DIAGNOSIS — I129 Hypertensive chronic kidney disease with stage 1 through stage 4 chronic kidney disease, or unspecified chronic kidney disease: Secondary | ICD-10-CM | POA: Diagnosis not present

## 2023-06-22 DIAGNOSIS — I10 Essential (primary) hypertension: Secondary | ICD-10-CM | POA: Diagnosis not present

## 2023-06-22 DIAGNOSIS — Z7984 Long term (current) use of oral hypoglycemic drugs: Secondary | ICD-10-CM | POA: Diagnosis not present

## 2023-06-22 DIAGNOSIS — E119 Type 2 diabetes mellitus without complications: Secondary | ICD-10-CM

## 2023-06-22 DIAGNOSIS — Z79899 Other long term (current) drug therapy: Secondary | ICD-10-CM | POA: Diagnosis not present

## 2023-06-22 DIAGNOSIS — M1611 Unilateral primary osteoarthritis, right hip: Principal | ICD-10-CM | POA: Insufficient documentation

## 2023-06-22 DIAGNOSIS — J45909 Unspecified asthma, uncomplicated: Secondary | ICD-10-CM

## 2023-06-22 HISTORY — PX: TOTAL HIP ARTHROPLASTY: SHX124

## 2023-06-22 LAB — CBC
HCT: 39 % (ref 36.0–46.0)
Hemoglobin: 12 g/dL (ref 12.0–15.0)
MCH: 28.2 pg (ref 26.0–34.0)
MCHC: 30.8 g/dL (ref 30.0–36.0)
MCV: 91.8 fL (ref 80.0–100.0)
Platelets: 285 10*3/uL (ref 150–400)
RBC: 4.25 MIL/uL (ref 3.87–5.11)
RDW: 13.1 % (ref 11.5–15.5)
WBC: 7.5 10*3/uL (ref 4.0–10.5)
nRBC: 0 % (ref 0.0–0.2)

## 2023-06-22 LAB — GLUCOSE, CAPILLARY
Glucose-Capillary: 141 mg/dL — ABNORMAL HIGH (ref 70–99)
Glucose-Capillary: 96 mg/dL (ref 70–99)
Glucose-Capillary: 207 mg/dL — ABNORMAL HIGH (ref 70–99)

## 2023-06-22 LAB — BASIC METABOLIC PANEL
Anion gap: 5 (ref 5–15)
BUN: 21 mg/dL (ref 8–23)
CO2: 26 mmol/L (ref 22–32)
Calcium: 8.6 mg/dL — ABNORMAL LOW (ref 8.9–10.3)
Chloride: 106 mmol/L (ref 98–111)
Creatinine, Ser: 0.71 mg/dL (ref 0.44–1.00)
GFR, Estimated: >60 mL/min (ref >60)
Glucose, Bld: 117 mg/dL — ABNORMAL HIGH (ref 70–99)
Potassium: 4 mmol/L (ref 3.5–5.1)
Sodium: 137 mmol/L (ref 135–145)

## 2023-06-22 LAB — ABO/RH: ABO/RH(D): AB POS

## 2023-06-22 SURGERY — ARTHROPLASTY, HIP, TOTAL, ANTERIOR APPROACH
Anesthesia: Monitor Anesthesia Care | Site: Hip | Laterality: Right

## 2023-06-22 MED ORDER — DIPHENHYDRAMINE HCL 12.5 MG/5ML PO ELIX: 12.5000 mg | ORAL_SOLUTION | ORAL | Status: DC | PRN

## 2023-06-22 MED ORDER — DEXAMETHASONE SODIUM PHOSPHATE 10 MG/ML IJ SOLN
INTRAMUSCULAR | Status: AC
  Filled 2023-06-22: qty 1

## 2023-06-22 MED ORDER — METFORMIN HCL 500 MG PO TABS
1000.0000 mg | ORAL_TABLET | Freq: Two times a day (BID) | ORAL | Status: DC
  Administered 2023-06-22 – 2023-06-23 (×2): 1000 mg via ORAL
  Filled 2023-06-22 (×2): qty 2

## 2023-06-22 MED ORDER — DULOXETINE HCL 60 MG PO CPEP
60.0000 mg | ORAL_CAPSULE | Freq: Every day | ORAL | Status: DC
  Administered 2023-06-22 – 2023-06-23 (×2): 60 mg via ORAL
  Filled 2023-06-22 (×2): qty 1

## 2023-06-22 MED ORDER — INSULIN ASPART 100 UNIT/ML IJ SOLN
0.0000 [IU] | Freq: Three times a day (TID) | INTRAMUSCULAR | Status: DC
  Administered 2023-06-23: 3 [IU] via SUBCUTANEOUS

## 2023-06-22 MED ORDER — BUPIVACAINE IN DEXTROSE 0.75-8.25 % IT SOLN
INTRATHECAL | Status: DC | PRN
  Administered 2023-06-22: 1.6 mL via INTRATHECAL

## 2023-06-22 MED ORDER — ACETAMINOPHEN 10 MG/ML IV SOLN: 1000.0000 mg | Freq: Once | INTRAVENOUS | Status: DC | PRN

## 2023-06-22 MED ORDER — ONDANSETRON HCL 4 MG/2ML IJ SOLN
INTRAMUSCULAR | Status: DC | PRN
  Administered 2023-06-22: 4 mg via INTRAVENOUS

## 2023-06-22 MED ORDER — LISINOPRIL 10 MG PO TABS
10.0000 mg | ORAL_TABLET | Freq: Every day | ORAL | Status: DC
  Administered 2023-06-23: 10 mg via ORAL
  Filled 2023-06-22: qty 1

## 2023-06-22 MED ORDER — 0.9 % SODIUM CHLORIDE (POUR BTL) OPTIME
TOPICAL | Status: DC | PRN
  Administered 2023-06-22: 1000 mL

## 2023-06-22 MED ORDER — ONDANSETRON HCL 4 MG/2ML IJ SOLN
INTRAMUSCULAR | Status: AC
  Filled 2023-06-22: qty 2

## 2023-06-22 MED ORDER — CEFAZOLIN SODIUM-DEXTROSE 2-4 GM/100ML-% IV SOLN
2.0000 g | INTRAVENOUS | Status: AC
  Administered 2023-06-22: 2 g via INTRAVENOUS

## 2023-06-22 MED ORDER — ACETAMINOPHEN 325 MG PO TABS: 325.0000 mg | ORAL_TABLET | Freq: Four times a day (QID) | ORAL | Status: DC | PRN

## 2023-06-22 MED ORDER — DOCUSATE SODIUM 100 MG PO CAPS
100.0000 mg | ORAL_CAPSULE | Freq: Two times a day (BID) | ORAL | Status: DC
  Administered 2023-06-22 – 2023-06-23 (×2): 100 mg via ORAL
  Filled 2023-06-22 (×2): qty 1

## 2023-06-22 MED ORDER — METOPROLOL TARTRATE 25 MG PO TABS
25.0000 mg | ORAL_TABLET | Freq: Two times a day (BID) | ORAL | Status: DC
  Filled 2023-06-22: qty 1

## 2023-06-22 MED ORDER — PHENOL 1.4 % MT LIQD: 1.0000 | OROMUCOSAL | Status: DC | PRN

## 2023-06-22 MED ORDER — PROPOFOL 1000 MG/100ML IV EMUL
INTRAVENOUS | Status: AC
  Filled 2023-06-22: qty 100

## 2023-06-22 MED ORDER — TRANEXAMIC ACID-NACL 1000-0.7 MG/100ML-% IV SOLN
1000.0000 mg | INTRAVENOUS | Status: AC
  Administered 2023-06-22: 1000 mg via INTRAVENOUS

## 2023-06-22 MED ORDER — HYDROCODONE-ACETAMINOPHEN 5-325 MG PO TABS
1.0000 | ORAL_TABLET | ORAL | Status: DC | PRN
  Administered 2023-06-23: 2 via ORAL
  Filled 2023-06-22: qty 2

## 2023-06-22 MED ORDER — MIDAZOLAM HCL 2 MG/2ML IJ SOLN
INTRAMUSCULAR | Status: AC
  Filled 2023-06-22: qty 2

## 2023-06-22 MED ORDER — FENTANYL CITRATE PF 50 MCG/ML IJ SOSY: 25.0000 ug | PREFILLED_SYRINGE | INTRAMUSCULAR | Status: DC | PRN

## 2023-06-22 MED ORDER — ASPIRIN 81 MG PO CHEW
81.0000 mg | CHEWABLE_TABLET | Freq: Two times a day (BID) | ORAL | Status: DC
  Administered 2023-06-22 – 2023-06-23 (×2): 81 mg via ORAL
  Filled 2023-06-22 (×2): qty 1

## 2023-06-22 MED ORDER — CEFAZOLIN SODIUM-DEXTROSE 2-4 GM/100ML-% IV SOLN
INTRAVENOUS | Status: AC
  Filled 2023-06-22: qty 100

## 2023-06-22 MED ORDER — SERTRALINE HCL 100 MG PO TABS
100.0000 mg | ORAL_TABLET | Freq: Every day | ORAL | Status: DC
Start: 2023-06-23 — End: 2023-06-23
  Administered 2023-06-23: 100 mg via ORAL
  Filled 2023-06-22: qty 1

## 2023-06-22 MED ORDER — INSULIN GLARGINE 100 UNIT/ML ~~LOC~~ SOLN
15.0000 [IU] | Freq: Every day | SUBCUTANEOUS | Status: DC
  Administered 2023-06-23: 15 [IU] via SUBCUTANEOUS
  Filled 2023-06-22: qty 0.15

## 2023-06-22 MED ORDER — METOCLOPRAMIDE HCL 5 MG/ML IJ SOLN: 5.0000 mg | Freq: Three times a day (TID) | INTRAMUSCULAR | Status: DC | PRN

## 2023-06-22 MED ORDER — LACTATED RINGERS IV SOLN
INTRAVENOUS | Status: DC
Start: 2023-06-22 — End: 2023-06-22

## 2023-06-22 MED ORDER — CHLORHEXIDINE GLUCONATE 0.12 % MT SOLN
15.0000 mL | Freq: Once | OROMUCOSAL | Status: AC
  Administered 2023-06-22: 15 mL via OROMUCOSAL

## 2023-06-22 MED ORDER — HYDROCODONE-ACETAMINOPHEN 7.5-325 MG PO TABS
1.0000 | ORAL_TABLET | ORAL | Status: DC | PRN
  Administered 2023-06-22: 2 via ORAL
  Administered 2023-06-22: 1 via ORAL
  Administered 2023-06-23 (×2): 2 via ORAL
  Filled 2023-06-22 (×3): qty 2
  Filled 2023-06-22: qty 1

## 2023-06-22 MED ORDER — MUPIROCIN 2 % EX OINT: 1.0000 | TOPICAL_OINTMENT | Freq: Two times a day (BID) | CUTANEOUS | 0 refills | Status: AC

## 2023-06-22 MED ORDER — SODIUM CHLORIDE 0.9 % IV SOLN
INTRAVENOUS | Status: DC
Start: 2023-06-22 — End: 2023-06-23

## 2023-06-22 MED ORDER — MIDAZOLAM HCL 2 MG/2ML IJ SOLN
INTRAMUSCULAR | Status: DC | PRN
  Administered 2023-06-22: 1 mg via INTRAVENOUS

## 2023-06-22 MED ORDER — PROPOFOL 10 MG/ML IV BOLUS
INTRAVENOUS | Status: AC
  Filled 2023-06-22: qty 20

## 2023-06-22 MED ORDER — MENTHOL 3 MG MT LOZG: 1.0000 | LOZENGE | OROMUCOSAL | Status: DC | PRN

## 2023-06-22 MED ORDER — ONDANSETRON HCL 4 MG/2ML IJ SOLN: 4.0000 mg | Freq: Four times a day (QID) | INTRAMUSCULAR | Status: DC | PRN

## 2023-06-22 MED ORDER — INSULIN GLARGINE-YFGN 100 UNIT/ML ~~LOC~~ SOLN: 15.0000 [IU] | Freq: Every day | SUBCUTANEOUS | Status: DC

## 2023-06-22 MED ORDER — PROPOFOL 500 MG/50ML IV EMUL
INTRAVENOUS | Status: DC | PRN
  Administered 2023-06-22: 100 ug/kg/min via INTRAVENOUS

## 2023-06-22 MED ORDER — METOCLOPRAMIDE HCL 5 MG PO TABS: 5.0000 mg | ORAL_TABLET | Freq: Three times a day (TID) | ORAL | Status: DC | PRN

## 2023-06-22 MED ORDER — METHOCARBAMOL 1000 MG/10ML IJ SOLN: 500.0000 mg | Freq: Four times a day (QID) | INTRAMUSCULAR | Status: DC | PRN

## 2023-06-22 MED ORDER — METHOCARBAMOL 500 MG PO TABS
500.0000 mg | ORAL_TABLET | Freq: Four times a day (QID) | ORAL | Status: DC | PRN
  Administered 2023-06-22: 500 mg via ORAL
  Filled 2023-06-22: qty 1

## 2023-06-22 MED ORDER — PANTOPRAZOLE SODIUM 40 MG PO TBEC
40.0000 mg | DELAYED_RELEASE_TABLET | Freq: Every day | ORAL | Status: DC
  Administered 2023-06-23: 40 mg via ORAL
  Filled 2023-06-22: qty 1

## 2023-06-22 MED ORDER — MORPHINE SULFATE (PF) 2 MG/ML IV SOLN: 0.5000 mg | INTRAVENOUS | Status: DC | PRN

## 2023-06-22 MED ORDER — TRANEXAMIC ACID-NACL 1000-0.7 MG/100ML-% IV SOLN
INTRAVENOUS | Status: AC
  Filled 2023-06-22: qty 100

## 2023-06-22 MED ORDER — CHLORHEXIDINE GLUCONATE 4 % EX SOLN: 1.0000 | CUTANEOUS | 1 refills | Status: DC

## 2023-06-22 MED ORDER — POVIDONE-IODINE 10 % EX SWAB: 2.0000 | Freq: Once | CUTANEOUS | Status: DC

## 2023-06-22 MED ORDER — CEFAZOLIN SODIUM-DEXTROSE 2-4 GM/100ML-% IV SOLN
2.0000 g | Freq: Four times a day (QID) | INTRAVENOUS | Status: AC
  Administered 2023-06-22 – 2023-06-23 (×2): 2 g via INTRAVENOUS
  Filled 2023-06-22 (×2): qty 100

## 2023-06-22 MED ORDER — ORAL CARE MOUTH RINSE: 15.0000 mL | Freq: Once | OROMUCOSAL | Status: AC

## 2023-06-22 MED ORDER — SODIUM CHLORIDE 0.9 % IR SOLN
Status: DC | PRN
  Administered 2023-06-22: 1000 mL

## 2023-06-22 MED ORDER — ONDANSETRON HCL 4 MG PO TABS: 4.0000 mg | ORAL_TABLET | Freq: Four times a day (QID) | ORAL | Status: DC | PRN

## 2023-06-22 MED ORDER — PHENYLEPHRINE HCL-NACL 20-0.9 MG/250ML-% IV SOLN
INTRAVENOUS | Status: DC | PRN
  Administered 2023-06-22: 20 ug/min via INTRAVENOUS

## 2023-06-22 MED ORDER — DEXAMETHASONE SODIUM PHOSPHATE 10 MG/ML IJ SOLN
INTRAMUSCULAR | Status: DC | PRN
  Administered 2023-06-22: 4 mg via INTRAVENOUS

## 2023-06-22 MED ORDER — ALUM & MAG HYDROXIDE-SIMETH 200-200-20 MG/5ML PO SUSP: 30.0000 mL | ORAL | Status: DC | PRN

## 2023-06-22 SURGICAL SUPPLY — 37 items
BAG COUNTER SPONGE SURGICOUNT (BAG) ×1 IMPLANT
BAG ZIPLOCK 12X15 (MISCELLANEOUS) IMPLANT
BENZOIN TINCTURE PRP APPL 2/3 (GAUZE/BANDAGES/DRESSINGS) IMPLANT
BLADE SAW SGTL 18X1.27X75 (BLADE) ×1 IMPLANT
COVER PERINEAL POST (MISCELLANEOUS) ×1 IMPLANT
COVER SURGICAL LIGHT HANDLE (MISCELLANEOUS) ×1 IMPLANT
CUP SECTOR GRIPTON 50MM (Cup) IMPLANT
DRAPE FOOT SWITCH (DRAPES) ×1 IMPLANT
DRAPE STERI IOBAN 125X83 (DRAPES) ×1 IMPLANT
DRAPE U-SHAPE 47X51 STRL (DRAPES) ×2 IMPLANT
DRSG AQUACEL AG ADV 3.5X10 (GAUZE/BANDAGES/DRESSINGS) ×1 IMPLANT
DURAPREP 26ML APPLICATOR (WOUND CARE) ×1 IMPLANT
ELECT REM PT RETURN 15FT ADLT (MISCELLANEOUS) ×1 IMPLANT
GAUZE XEROFORM 1X8 LF (GAUZE/BANDAGES/DRESSINGS) IMPLANT
GLOVE BIO SURGEON STRL SZ7.5 (GLOVE) ×1 IMPLANT
GLOVE BIOGEL PI IND STRL 8 (GLOVE) ×2 IMPLANT
GLOVE ECLIPSE 8.0 STRL XLNG CF (GLOVE) ×1 IMPLANT
GOWN STRL REUS W/ TWL XL LVL3 (GOWN DISPOSABLE) ×2 IMPLANT
HEAD FEM STD 32X+1 STRL (Hips) IMPLANT
HOLDER FOLEY CATH W/STRAP (MISCELLANEOUS) ×1 IMPLANT
KIT TURNOVER KIT A (KITS) IMPLANT
LINER ACET PNNCL PLUS4 NEUTRAL (Hips) IMPLANT
PACK ANTERIOR HIP CUSTOM (KITS) ×1 IMPLANT
PINNACLE PLUS 4 NEUTRAL (Hips) ×1 IMPLANT
SET HNDPC FAN SPRY TIP SCT (DISPOSABLE) ×1 IMPLANT
STAPLER SKIN PROX WIDE 3.9 (STAPLE) IMPLANT
STEM FEM ACTIS STD SZ1 (Stem) IMPLANT
STRIP CLOSURE SKIN 1/2X4 (GAUZE/BANDAGES/DRESSINGS) IMPLANT
SUT ETHIBOND NAB CT1 #1 30IN (SUTURE) ×1 IMPLANT
SUT ETHILON 2 0 PS N (SUTURE) IMPLANT
SUT MNCRL AB 4-0 PS2 18 (SUTURE) IMPLANT
SUT VIC AB 0 CT1 36 (SUTURE) ×1 IMPLANT
SUT VIC AB 1 CT1 36 (SUTURE) ×1 IMPLANT
SUT VIC AB 2-0 CT1 TAPERPNT 27 (SUTURE) ×2 IMPLANT
TRAY FOLEY MTR SLVR 14FR STAT (SET/KITS/TRAYS/PACK) IMPLANT
TRAY FOLEY MTR SLVR 16FR STAT (SET/KITS/TRAYS/PACK) IMPLANT
YANKAUER SUCT BULB TIP NO VENT (SUCTIONS) ×1 IMPLANT

## 2023-06-22 NOTE — Interval H&P Note (Signed)
 History and Physical Interval Note: The patient understands that she is here today for right total hip replacement to treat her significant right hip pain and arthritis.  There has been no acute or interval change in her medical status.  The risks and benefits of surgery have been discussed in detail and informed consent have been obtained.  The right operative hip has been marked.  06/22/2023 11:25 AM  Priscilla [name]  has presented today for surgery, with the diagnosis of osteoarthritis right hip.  The various methods of treatment have been discussed with the patient and family. After consideration of risks, benefits and other options for treatment, the patient has consented to  Procedure(s): RIGHT TOTAL HIP ARTHROPLASTY ANTERIOR APPROACH (Right) as a surgical intervention.  The patient's history has been reviewed, patient examined, no change in status, stable for surgery.  I have reviewed the patient's chart and labs.  Questions were answered to the patient's satisfaction.     Kathryne Hitch

## 2023-06-22 NOTE — Op Note (Signed)
 Operative Notes  Date of operation: 06/22/2023 Preoperative diagnosis: Right hip primary osteoarthritis Postoperative diagnosis: Same  Procedure: Right direct anterior total hip arthroplasty  Implants: Implant Name Type Inv. Item Serial No. Manufacturer Lot No. LRB No. Used Action  CUP SECTOR GRIPTON - [id number] Cup CUP SECTOR GRIPTON  [employer]PU[employer] [id number] Right 1 Implanted  PINNACLE PLUS 4 NEUTRAL - [id number] Hips PINNACLE PLUS 4 NEUTRAL  DEPUY ORTHOPAEDICS [id number] Right 1 Implanted  STEM FEM ACTIS STD SZ1 - [id number] Stem STEM FEM ACTIS STD SZ1  DEPUY ORTHOPAEDICS [id number] Right 1 Implanted  HEAD FEM STD 32X+1 STRL - [id number] Hips HEAD FEM STD 32X+1 Jolayne Haines [employer] [id number] Right 1 Implanted   Surgeon: Vanita Panda. Magnus Ivan, MD Assistant: Rexene Edison, PA-C  Anesthesia: Spinal EBL: 100 cc Antibiotics: IV Ancef Complications: None  Indications: The patient is a 73 year old female with well-documented debilitating arthritis involving her right hip.  Her x-rays show bone-on-bone arthritis with complete loss of joint space.  Her right hip pain is daily and it is detrimentally affecting her mobility, her quality of life and her actives daily living to the point she does wish to proceed with a hip replacement and we agree with this as well based on a combination of the failure of conservative treatment, her continued pain and her clinical exam and x-ray findings.  We did discuss the risks of acute blood loss anemia, nerve vessel injury, fracture, infection, DVT, dislocation, implant failure, leg length differences and wound healing issues.  She understands that our goals are hopefully decreased pain, improved mobility and improved quality of life.  Procedure description: After informed consent was obtained and the appropriate right hip was marked, the patient was brought to the operating room and set up on the stretcher where spinal anesthesia was obtained.  She was  then laid in supine position on the operating table and a Foley catheter was placed.  Traction boots were next placed on both her feet and she was then placed supine on the Hana fracture table with a perineal post in place and both legs in inline skeletal traction devices and no traction applied.  The right operative hip and pelvis were assessed radiographically.  The right hip was then prepped and draped with DuraPrep and sterile drapes.  A timeout was called and she was then divided the correct patient the correct right hip.  An incision was then made just inferior and posterior to the ASIS and carried slightly obliquely down the leg.  Dissection was carried down to the tensor fascia lata muscle and the tensor fascia was divided longitudinally to proceed with a direct anterior approach to the hip.  Circumflex vessels were identified and cauterized.  The hip capsule was identified and opened up in L-type format.  Cobra retractors were placed around the medial and lateral femoral neck and a femoral neck cut was made with oscillating saw just proximal to the lesser trochanter.  This cut was completed with an osteotome.  A corkscrew guide was placed in the femoral head and the femoral head was removed in entirety and there was a wide area devoid of cartilage.  A bent Hohmann was then placed over the medial acetabular rim and remnants of the acetabular labrum and other debris removed.  Reaming was initiated from a size 43 reamer and stepwise increments going up to a size 49 reamer with all reamers placed under direct visualization and the last reamer placed under direct fluoroscopy as well in order to  obtain the depth of reaming, the inclination and the anteversion.  The real DePuy Sectra GRIPTION acetabular component size 50 was then placed without difficulty followed by a 32+4 polythene liner.  Attention was then turned to the femur.  With the right leg externally rotated to 120 degrees, extended and adducted, a  Mueller retractor was placed medially and a Hohmann retractor behind the greater trochanter.  The lateral joint capsule was released and a box cutting osteotome was used in her femoral canal.  Broaching was then initiated using the Actis broaching system from a size 0 going to just a size 1 given her narrow canal.  We then trialed a size 1 femoral component combined with a standard neck and a 32+1 trial hip ball.  The right leg was brought over and up and with traction and internal rotation we are pleased with her radiographic assessment and clinical assessment in terms of stability, leg length and offset.  We then dislocated the hip remove the trial components.  We placed the real Actis femoral component with standard offset size 1 and the real 32+1 metal hip ball.  Again this was reduced in the pelvis and we are pleased again with assessment clinically and radiographically of stability, leg length, offset and range of motion.  The soft tissue was then irrigated normal saline solution.  Remnants of the joint capsule were closed with interrupted #1 Ethibond suture followed by #1 Vicryl close the tensor fascia.  0 Vicryl was used to close the deep tissue and 2-0 Vicryl was used to close subcutaneous tissue.  The skin was closed with staples.  An Aquacel dressing was applied.  The patient was taken off of the Hana table and taken to the recovery room.  Rexene Edison, PA-C did assist during the entire case from beginning to end and his assistance was crucial and medically necessary for soft tissue management and retraction, helping guide implant placement and a layered closure of the wound.

## 2023-06-22 NOTE — Transfer of Care (Signed)
 Immediate Anesthesia Transfer of Care Note  Patient: Priscilla [name]  Procedure(s) Performed: RIGHT TOTAL HIP ARTHROPLASTY ANTERIOR APPROACH (Right: Hip)  Patient Location: PACU  Anesthesia Type:MAC and Spinal  Level of Consciousness: alert  and drowsy  Airway & Oxygen Therapy: Patient Spontanous Breathing and Patient connected to face mask oxygen  Post-op Assessment: Report given to RN and Post -op Vital signs reviewed and stable  Post vital signs: Reviewed and stable  Last Vitals:  Vitals Value Taken Time  BP 87/55 06/22/23 1430  Temp    Pulse 71 06/22/23 1433  Resp 27 06/22/23 1433  SpO2 100 % 06/22/23 1433  Vitals shown include unfiled device data.  Last Pain:  Vitals:   06/22/23 1018  TempSrc: Oral  PainSc:          Complications: No notable events documented.

## 2023-06-22 NOTE — Anesthesia Postprocedure Evaluation (Signed)
 Anesthesia Post Note  Patient: Priscilla [name]  Procedure(s) Performed: RIGHT TOTAL HIP ARTHROPLASTY ANTERIOR APPROACH (Right: Hip)     Patient location during evaluation: PACU Anesthesia Type: MAC and Spinal Level of consciousness: awake and alert Pain management: pain level controlled Vital Signs Assessment: post-procedure vital signs reviewed and stable Respiratory status: spontaneous breathing, nonlabored ventilation, respiratory function stable and patient connected to nasal cannula oxygen Cardiovascular status: stable and blood pressure returned to baseline Postop Assessment: no apparent nausea or vomiting Anesthetic complications: no   No notable events documented.  Last Vitals:  Vitals:   06/22/23 1630 06/22/23 1652  BP: (!) 107/58 90/64  Pulse: (!) 56 70  Resp: 12 14  Temp: (!) 36.4 C (!) 36.4 C  SpO2: 95% 98%    Last Pain:  Vitals:   06/22/23 1652  TempSrc: Oral  PainSc: 2                  Nelle Don [name]

## 2023-06-22 NOTE — Anesthesia Procedure Notes (Signed)
 Spinal  Patient location during procedure: OR Start time: 06/22/2023 12:55 PM End time: 06/22/2023 1:00 PM Staffing Performed: anesthesiologist  Anesthesiologist: Atilano Median, DO Performed by: Atilano Median, DO Authorized by: Atilano Median, DO   Preanesthetic Checklist Completed: patient identified, IV checked, site marked, risks and benefits discussed, surgical consent, monitors and equipment checked, pre-op evaluation and timeout performed Spinal Block Patient position: sitting Prep: DuraPrep Patient monitoring: heart rate, cardiac monitor, continuous pulse ox and blood pressure Approach: midline Location: L3-4 Injection technique: single-shot Needle Needle type: Whitacre  Needle gauge: 22 G Needle length: 9 cm Assessment Events: CSF return Additional Notes Patient identified. Risks/Benefits/Options discussed with patient including but not limited to bleeding, infection, nerve damage, paralysis, failed block, incomplete pain control, headache, blood pressure changes, nausea, vomiting, reactions to medications, itching and postpartum back pain. Confirmed with bedside nurse the patient's most recent platelet count. Confirmed with patient that they are not currently taking any anticoagulation, have any bleeding history or any family history of bleeding disorders. Patient expressed understanding and wished to proceed. All questions were answered. Sterile technique was used throughout the entire procedure. Please see nursing notes for vital signs. Warning signs of high block given to the patient including shortness of breath, tingling/numbness in hands, complete motor block, or any concerning symptoms with instructions to call for help. Patient was given instructions on fall risk and not to get out of bed. All questions and concerns addressed with instructions to call with any issues or inadequate analgesia.

## 2023-06-23 DIAGNOSIS — M1611 Unilateral primary osteoarthritis, right hip: Secondary | ICD-10-CM | POA: Diagnosis not present

## 2023-06-23 DIAGNOSIS — M1711 Unilateral primary osteoarthritis, right knee: Secondary | ICD-10-CM | POA: Diagnosis not present

## 2023-06-23 DIAGNOSIS — Z96641 Presence of right artificial hip joint: Secondary | ICD-10-CM | POA: Diagnosis not present

## 2023-06-23 LAB — BASIC METABOLIC PANEL
Anion gap: 7 (ref 5–15)
BUN: 22 mg/dL (ref 8–23)
CO2: 24 mmol/L (ref 22–32)
Calcium: 8.5 mg/dL — ABNORMAL LOW (ref 8.9–10.3)
Chloride: 105 mmol/L (ref 98–111)
Creatinine, Ser: 0.79 mg/dL (ref 0.44–1.00)
GFR, Estimated: >60 mL/min (ref >60)
Glucose, Bld: 148 mg/dL — ABNORMAL HIGH (ref 70–99)
Potassium: 4.3 mmol/L (ref 3.5–5.1)
Sodium: 136 mmol/L (ref 135–145)

## 2023-06-23 LAB — CBC
HCT: 35.2 % — ABNORMAL LOW (ref 36.0–46.0)
Hemoglobin: 11.2 g/dL — ABNORMAL LOW (ref 12.0–15.0)
MCH: 28.9 pg (ref 26.0–34.0)
MCHC: 31.8 g/dL (ref 30.0–36.0)
MCV: 90.7 fL (ref 80.0–100.0)
Platelets: 225 10*3/uL (ref 150–400)
RBC: 3.88 MIL/uL (ref 3.87–5.11)
RDW: 13 % (ref 11.5–15.5)
WBC: 11.3 10*3/uL — ABNORMAL HIGH (ref 4.0–10.5)
nRBC: 0 % (ref 0.0–0.2)

## 2023-06-23 LAB — GLUCOSE, CAPILLARY
Glucose-Capillary: 157 mg/dL — ABNORMAL HIGH (ref 70–99)
Glucose-Capillary: 120 mg/dL — ABNORMAL HIGH (ref 70–99)

## 2023-06-23 MED ORDER — ASPIRIN 81 MG PO CHEW: 81.0000 mg | CHEWABLE_TABLET | Freq: Two times a day (BID) | ORAL | 0 refills | Status: AC

## 2023-06-23 MED ORDER — HYDROCODONE-ACETAMINOPHEN 5-325 MG PO TABS: 1.0000 | ORAL_TABLET | Freq: Four times a day (QID) | ORAL | 0 refills | Status: DC | PRN

## 2023-06-23 MED ORDER — METHOCARBAMOL 500 MG PO TABS: 500.0000 mg | ORAL_TABLET | Freq: Four times a day (QID) | ORAL | 1 refills | Status: DC | PRN

## 2023-06-23 NOTE — Discharge Instructions (Signed)

## 2023-06-23 NOTE — Care Management Obs Status (Signed)
 MEDICARE OBSERVATION STATUS NOTIFICATION   Patient Details  Name: Priscilla [name] MRN: [id number] Date of Birth: 1951/01/30   Medicare Observation Status Notification Given:  Yes    Ewing Schlein, LCSW 06/23/2023, 9:34 AM

## 2023-06-23 NOTE — Progress Notes (Signed)
 Physical Therapy Treatment Patient Details Name: Priscilla [name] MRN: [id number] DOB: Oct 20, 1950 Today's Date: 06/23/2023   History of Present Illness 73 yo female s/p R DA THA on 06/22/23. PMH: spondylosis, OA, DDD, peripheral neuropathy, HTN, gastric banding    PT Comments  Pt making excellent progress with PT. Reviewed mobility as below, stairs, initiated HEP.plan is for HHPT; pt is ready to d/c home with family assist prn from PT standpoint. RN updated   If plan is discharge home, recommend the following: A little help with bathing/dressing/bathroom;Assist for transportation;Help with stairs or ramp for entrance   Can travel by private vehicle        Equipment Recommendations  None recommended by PT    Recommendations for Other Services       Precautions / Restrictions Precautions Precautions: Fall Restrictions Weight Bearing Restrictions Per Provider Order: No Other Position/Activity Restrictions: WBAT     Mobility  Bed Mobility Overal bed mobility: Needs Assistance Bed Mobility: Sit to Supine     Supine to sit: Min assist Sit to supine: Min assist   General bed mobility comments: assist with RLE, instructed in use of gait belt as leg lifter    Transfers Overall transfer level: Needs assistance Equipment used: Rolling walker (2 wheels) Transfers: Sit to/from Stand Sit to Stand: Contact guard assist           General transfer comment: cues for hand placement    Ambulation/Gait Ambulation/Gait assistance: Contact guard assist, Supervision Gait Distance (Feet): 60 Feet Assistive device: Rolling walker (2 wheels) Gait Pattern/deviations: Step-to pattern, Decreased stance time - right       General Gait Details: cues for initial sequence, CGA to supervision for safety; wt shift to RLE improved with distance   Stairs Stairs: Yes Stairs assistance: Contact guard assist Stair Management: Step to pattern, One rail Right, One rail Left, Sideways Number  of Stairs: 3 General stair comments: cues for sequence and technique; good stability, no LOB   Wheelchair Mobility     Tilt Bed    Modified Rankin (Stroke Patients Only)       Balance Overall balance assessment: Mild deficits observed, not formally tested                                          Communication Communication Communication: No apparent difficulties  Cognition Arousal: Alert Behavior During Therapy: WFL for tasks assessed/performed   PT - Cognitive impairments: No apparent impairments                         Following commands: Intact      Cueing Cueing Techniques: Verbal cues  Exercises Total Joint Exercises Ankle Circles/Pumps: AROM, Both, 10 reps    General Comments General comments (skin integrity, edema, etc.): initiated HEP reviewing ankle pumps, quad sets and heel slides with gait belt to self assist as needed      Pertinent Vitals/Pain Pain Assessment Pain Assessment: 0-10 Pain Score: 3  Pain Location: right hip Pain Descriptors / Indicators: Discomfort, Sore Pain Intervention(s): Limited activity within patient's tolerance, Monitored during session, Premedicated before session, Repositioned, Ice applied    Home Living Family/patient expects to be discharged to:: Private residence Living Arrangements: Spouse/significant other;Children Available Help at Discharge: Family Type of Home: House Home Access: Stairs to enter           Additional  Comments: livign with dtr d/t spouse's dementia (husband staying at hospice house until Tuesday); pt is retired PTA    Prior Function            PT Goals (current goals can now be found in the care plan section) Acute Rehab PT Goals PT Goal Formulation: With patient Time For Goal Achievement: 06/30/23 Potential to Achieve Goals: Good Progress towards PT goals: Progressing toward goals    Frequency    7X/week      PT Plan      Co-evaluation               AM-PAC PT "6 Clicks" Mobility   Outcome Measure  Help needed turning from your back to your side while in a flat bed without using bedrails?: A Little Help needed moving from lying on your back to sitting on the side of a flat bed without using bedrails?: A Little Help needed moving to and from a bed to a chair (including a wheelchair)?: A Little Help needed standing up from a chair using your arms (e.g., wheelchair or bedside chair)?: A Little Help needed to walk in hospital room?: A Little Help needed climbing 3-5 steps with a railing? : A Little 6 Click Score: 18    End of Session Equipment Utilized During Treatment: Gait belt Activity Tolerance: Patient tolerated treatment well Patient left: with call bell/phone within reach;in bed;with bed alarm set Nurse Communication: Mobility status PT Visit Diagnosis: Other abnormalities of gait and mobility (R26.89)     Time: 4782-9562 PT Time Calculation (min) (ACUTE ONLY): 19 min  Charges:    $Gait Training: 8-22 mins PT General Charges $$ ACUTE PT VISIT: 1 Visit                     Felina Tello, PT  Acute Rehab Dept [facility]) [phone]  06/23/2023    [facility] 06/23/2023, 2:35 PM

## 2023-06-23 NOTE — TOC Transition Note (Signed)
 Transition of Care [facility]) - Discharge Note  Patient Details  Name: Priscilla [name] MRN: [id number] Date of Birth: November 08, 1950  [facility]) CM/SW Contact:  Ewing Schlein, LCSW Phone Number: 06/23/2023, 9:41 AM  Clinical Narrative: CSW met with patient to confirm discharge plan and needs. Patient will go home with HHPT, which was prearranged with [facility] Patient will need a rolling walker and does not have a DME company preference. Rolling walker provided by [employer] and was delivered to patient's room.  Final next level of care: Home w Home Health Services Barriers to Discharge: No Barriers Identified  Patient Goals and CMS Choice Patient states their goals for this hospitalization and ongoing recovery are:: Discharge home with HHPT CMS Medicare.gov Compare Post Acute Care list provided to:: Patient Choice offered to / list presented to : Patient  Discharge Plan and Services Additional resources added to the After Visit Summary for         DME Arranged: Walker rolling DME Agency: [employer] Date DME Agency Contacted: 06/23/23 Time DME Agency Contacted: [phone] Representative spoke with at DME Agency: [facility] HH Arranged: PT HH Agency: [facility] Representative spoke with at [facility] Agency: Prearranged in orthopedist's office  Social Drivers of Health (SDOH) Interventions SDOH Screenings   Food Insecurity: No Food Insecurity (06/22/2023)  Housing: Low Risk  (06/22/2023)  Transportation Needs: No Transportation Needs (06/22/2023)  Utilities: Not At Risk (06/22/2023)  Alcohol Screen: Low Risk  (03/21/2023)  Depression (PHQ2-9): Medium Risk (03/21/2023)  Financial Resource Strain: Low Risk  (03/21/2023)  Physical Activity: Inactive (03/21/2023)  Social Connections: Moderately Isolated (06/22/2023)  Stress: Stress Concern Present (03/21/2023)  Tobacco Use: Low Risk  (06/22/2023)  Health Literacy: Adequate Health Literacy (03/21/2023)   Readmission Risk Interventions     No  data to display

## 2023-06-23 NOTE — Progress Notes (Signed)
 Orthopedic Surgery Progress Note   Assessment: Patient is a 73 y.o. female with right hip OA status post THA   Plan: -Operative plans: complete -Diet: diabetic -DVT ppx: aspirin 81mg  BID -Antibiotics: ancef x2 post-op doses -Weight bearing status: as tolerated -PT evaluate and treat -Pain control -Anticipate discharge to home today  ___________________________________________________________________________  Subjective: No acute events overnight. Pain well controlled with oral medication. No complaints this morning.    Physical Exam:  General: no acute distress, appears stated age Neurologic: alert, answering questions appropriately, following commands Respiratory: unlabored breathing on room air, symmetric chest rise Psychiatric: appropriate affect, normal cadence to speech  MSK:   -Right lower extremity  Dressing over hip c/d/I  Calf compressible and without pain, negative homan EHL/TA/GSC intact Plantarflexes and dorsiflexes toes Sensation intact to light touch in sural, saphenous, tibial, deep peroneal, and superficial peroneal nerve distributions Foot warm and well perfused   Patient name: Priscilla [name] Patient MRN: [id number] Date: 06/23/23

## 2023-06-23 NOTE — Evaluation (Signed)
 Physical Therapy Evaluation Patient Details Name: Priscilla [name] MRN: [id number] DOB: 1950-09-23 Today's Date: 06/23/2023  History of Present Illness  73 yo female s/p R DA THA on 06/22/23. PMH: spondylosis, OA, DDD, peripheral neuropathy, HTN, gastric banding  Clinical Impression  Pt is s/p TKA resulting in the deficits listed below (see PT Problem List).  Pt doing very well this am. Pain controlled during therapy. Dtr present for session. Will see again later today and pt should be ready to d/c later this afternoon.  Pt will benefit from acute skilled PT to increase their independence and safety with mobility to allow discharge.          If plan is discharge home, recommend the following:     Can travel by private vehicle        Equipment Recommendations None recommended by PT  Recommendations for Other Services       Functional Status Assessment Patient has had a recent decline in their functional status and demonstrates the ability to make significant improvements in function in a reasonable and predictable amount of time.     Precautions / Restrictions Precautions Precautions: Fall Restrictions Weight Bearing Restrictions Per Provider Order: No Other Position/Activity Restrictions: WBAT      Mobility  Bed Mobility Overal bed mobility: Needs Assistance Bed Mobility: Supine to Sit     Supine to sit: Min assist     General bed mobility comments: assist with RLE    Transfers Overall transfer level: Needs assistance Equipment used: Rolling walker (2 wheels) Transfers: Sit to/from Stand Sit to Stand: Min assist, Contact guard assist           General transfer comment: cues for hand placement    Ambulation/Gait Ambulation/Gait assistance: Contact guard assist, Supervision Gait Distance (Feet): 80 Feet Assistive device: Rolling walker (2 wheels) Gait Pattern/deviations: Step-to pattern       General Gait Details: cues for initial sequence, min to   CGA  to steady  Stairs            Wheelchair Mobility     Tilt Bed    Modified Rankin (Stroke Patients Only)       Balance Overall balance assessment: Mild deficits observed, not formally tested                                           Pertinent Vitals/Pain Pain Assessment Pain Assessment: 0-10 Pain Score: 3  Pain Location: right hip Pain Descriptors / Indicators: Discomfort, Sore Pain Intervention(s): Limited activity within patient's tolerance, Monitored during session, Premedicated before session    Home Living Family/patient expects to be discharged to:: Private residence Living Arrangements: Spouse/significant other;Children Available Help at Discharge: Family Type of Home: House Home Access: Stairs to enter Entrance Stairs-Rails: Doctor, general practice of Steps: 5   Home Layout: One level;Able to live on main level with bedroom/bathroom Home Equipment: None Additional Comments: livign with dtr d/t spouse's dementia (husband staying at hospice house until Tuesday); pt is retired PTA    Prior Function Prior Level of Function : Independent/Modified Independent                     Extremity/Trunk Assessment   Upper Extremity Assessment Upper Extremity Assessment: Overall WFL for tasks assessed    Lower Extremity Assessment Lower Extremity Assessment: RLE deficits/detail RLE Deficits / Details: ankle WFL, knee and  hip AAROM grossly WFL, anticipated post op deficits       Communication   Communication Communication: No apparent difficulties    Cognition Arousal: Alert Behavior During Therapy: WFL for tasks assessed/performed   PT - Cognitive impairments: No apparent impairments                         Following commands: Intact       Cueing Cueing Techniques: Verbal cues     General Comments      Exercises Total Joint Exercises Ankle Circles/Pumps: AROM, Both, 10 reps   Assessment/Plan     PT Assessment Patient needs continued PT services  PT Problem List Decreased strength;Decreased range of motion;Decreased activity tolerance;Decreased balance;Decreased knowledge of use of DME;Decreased mobility;Pain       PT Treatment Interventions DME instruction;Gait training;Functional mobility training;Therapeutic activities;Patient/family education;Stair training;Therapeutic exercise    PT Goals (Current goals can be found in the Care Plan section)  Acute Rehab PT Goals PT Goal Formulation: With patient Time For Goal Achievement: 06/30/23 Potential to Achieve Goals: Good    Frequency 7X/week     Co-evaluation               AM-PAC PT "6 Clicks" Mobility  Outcome Measure Help needed turning from your back to your side while in a flat bed without using bedrails?: A Little Help needed moving from lying on your back to sitting on the side of a flat bed without using bedrails?: A Little Help needed moving to and from a bed to a chair (including a wheelchair)?: A Little Help needed standing up from a chair using your arms (e.g., wheelchair or bedside chair)?: A Little Help needed to walk in hospital room?: A Little Help needed climbing 3-5 steps with a railing? : A Little 6 Click Score: 18    End of Session Equipment Utilized During Treatment: Gait belt Activity Tolerance: Patient tolerated treatment well Patient left: with call bell/phone within reach;in chair;with chair alarm set;with family/visitor present Nurse Communication: Mobility status PT Visit Diagnosis: Other abnormalities of gait and mobility (R26.89)    Time: 3244-0102 PT Time Calculation (min) (ACUTE ONLY): 29 min   Charges:   PT Evaluation $PT Eval Low Complexity: 1 Low PT Treatments $Gait Training: 8-22 mins PT General Charges $$ ACUTE PT VISIT: 1 Visit         Thessaly Mccullers, PT  Acute Rehab Dept [facility]) [phone]  06/23/2023   [facility] 06/23/2023, 11:25 AM

## 2023-06-24 DIAGNOSIS — E1142 Type 2 diabetes mellitus with diabetic polyneuropathy: Secondary | ICD-10-CM | POA: Diagnosis not present

## 2023-06-24 DIAGNOSIS — G4733 Obstructive sleep apnea (adult) (pediatric): Secondary | ICD-10-CM | POA: Diagnosis not present

## 2023-06-24 DIAGNOSIS — L4050 Arthropathic psoriasis, unspecified: Secondary | ICD-10-CM | POA: Diagnosis not present

## 2023-06-24 DIAGNOSIS — E785 Hyperlipidemia, unspecified: Secondary | ICD-10-CM | POA: Diagnosis not present

## 2023-06-24 DIAGNOSIS — G243 Spasmodic torticollis: Secondary | ICD-10-CM | POA: Diagnosis not present

## 2023-06-24 DIAGNOSIS — E1122 Type 2 diabetes mellitus with diabetic chronic kidney disease: Secondary | ICD-10-CM | POA: Diagnosis not present

## 2023-06-24 DIAGNOSIS — N183 Chronic kidney disease, stage 3 unspecified: Secondary | ICD-10-CM | POA: Diagnosis not present

## 2023-06-24 DIAGNOSIS — G4700 Insomnia, unspecified: Secondary | ICD-10-CM | POA: Diagnosis not present

## 2023-06-24 DIAGNOSIS — M1811 Unilateral primary osteoarthritis of first carpometacarpal joint, right hand: Secondary | ICD-10-CM | POA: Diagnosis not present

## 2023-06-24 DIAGNOSIS — M545 Low back pain, unspecified: Secondary | ICD-10-CM | POA: Diagnosis not present

## 2023-06-24 DIAGNOSIS — M47816 Spondylosis without myelopathy or radiculopathy, lumbar region: Secondary | ICD-10-CM | POA: Diagnosis not present

## 2023-06-24 DIAGNOSIS — I129 Hypertensive chronic kidney disease with stage 1 through stage 4 chronic kidney disease, or unspecified chronic kidney disease: Secondary | ICD-10-CM | POA: Diagnosis not present

## 2023-06-24 DIAGNOSIS — M65341 Trigger finger, right ring finger: Secondary | ICD-10-CM | POA: Diagnosis not present

## 2023-06-24 DIAGNOSIS — E559 Vitamin D deficiency, unspecified: Secondary | ICD-10-CM | POA: Diagnosis not present

## 2023-06-24 DIAGNOSIS — F32A Depression, unspecified: Secondary | ICD-10-CM | POA: Diagnosis not present

## 2023-06-24 DIAGNOSIS — Z471 Aftercare following joint replacement surgery: Secondary | ICD-10-CM | POA: Diagnosis not present

## 2023-06-24 DIAGNOSIS — M797 Fibromyalgia: Secondary | ICD-10-CM | POA: Diagnosis not present

## 2023-06-24 DIAGNOSIS — D509 Iron deficiency anemia, unspecified: Secondary | ICD-10-CM | POA: Diagnosis not present

## 2023-06-24 DIAGNOSIS — Z96641 Presence of right artificial hip joint: Secondary | ICD-10-CM | POA: Diagnosis not present

## 2023-06-24 DIAGNOSIS — M1711 Unilateral primary osteoarthritis, right knee: Secondary | ICD-10-CM | POA: Diagnosis not present

## 2023-06-24 DIAGNOSIS — F419 Anxiety disorder, unspecified: Secondary | ICD-10-CM | POA: Diagnosis not present

## 2023-06-24 DIAGNOSIS — J45909 Unspecified asthma, uncomplicated: Secondary | ICD-10-CM | POA: Diagnosis not present

## 2023-06-24 DIAGNOSIS — M5030 Other cervical disc degeneration, unspecified cervical region: Secondary | ICD-10-CM | POA: Diagnosis not present

## 2023-06-24 DIAGNOSIS — G8929 Other chronic pain: Secondary | ICD-10-CM | POA: Diagnosis not present

## 2023-06-24 NOTE — Discharge Summary (Signed)
 Patient ID: Priscilla [name] MRN: [id number] DOB/AGE: 1950/06/12 73 y.o.  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   Diabetes mellitus with neuropathy (HCC) 12/14/2008   Dysrhythmia    sinus tachycardia, palps   Fibromyalgia 01/04/2007   sees rheuamtology   Hx of laparoscopic gastric banding    2010   Hyperlipemia 01/21/2010   Hypertension 12/14/2008   Leg edema 12/14/2008   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 female who was admitted 06/22/2023 for operative treatment ofUnilateral primary osteoarthritis, right hip. Patient has severe unremitting pain that affects sleep, daily activities, and work/hobbies. After pre-op clearance the patient was taken to the operating room on 06/22/2023 and underwent  Procedure(s): RIGHT TOTAL HIP ARTHROPLASTY ANTERIOR APPROACH.    Patient was given perioperative antibiotics:  Anti-infectives (From admission, onward)    Start     Dose/Rate Route Frequency Ordered Stop   06/22/23 1900  ceFAZolin (ANCEF) IVPB 2g/100 mL premix        2 g 200 mL/hr over 30 Minutes Intravenous Every 6 hours 06/22/23 1641 06/23/23 0054   06/22/23 1015  ceFAZolin (ANCEF) IVPB 2g/100 mL premix        2 g 200 mL/hr over 30 Minutes Intravenous  On call to O.R. 06/22/23 1001 06/22/23 1255   06/22/23 1013  ceFAZolin (ANCEF) 2-4 GM/100ML-% IVPB       Note to Pharmacy: [facility]: cabinet override      06/22/23 1013 06/22/23 1258        Patient was given sequential compression devices, early ambulation, and chemoprophylaxis to prevent DVT.  Patient benefited maximally from hospital stay and there were no complications.    Recent vital signs: No data found.   Recent laboratory studies:  Recent Labs    06/22/23 1440 06/23/23 0321  WBC 7.5 11.3*  HGB 12.0 11.2*  HCT 39.0 35.2*  PLT 285 225  NA 137 136  K 4.0 4.3  CL 106 105  CO2 26 24  BUN 21 22  CREATININE 0.71 0.79  GLUCOSE 117* 148*  CALCIUM 8.6* 8.5*     Discharge Medications:   Allergies as of 06/23/2023   No Known Allergies      Medication List     STOP taking these medications    aspirin 81 MG tablet Replaced by: aspirin 81 MG chewable tablet   traMADol 50 MG tablet Commonly known as: ULTRAM       TAKE these medications    aspirin 81 MG chewable tablet Chew 1 tablet (81 mg total) by mouth 2 (two) times daily. Replaces: aspirin 81 MG tablet   atorvastatin 40 MG tablet Commonly known as: LIPITOR Take 1 tablet (40 mg total) by mouth daily.   baclofen 10 MG tablet  Commonly known as: LIORESAL Take 0.5-1 tablets (5-10 mg total) by mouth in the morning and at bedtime. What changed:  how much to take when to take this   BD Pen Needle Nano 2nd Gen 32G X 4 MM Misc Generic drug: Insulin Pen Needle USE AS DIRECTED THREE TIMES DAILY FOR INSULIN   chlorhexidine 4 % external liquid Commonly known as: HIBICLENS Apply 15 mLs (1 Application total) topically as directed for 30 doses. Use as directed daily for 5 days every other week for 6 weeks.   COLLAGEN PO Take 1 Scoop by mouth daily.   DULoxetine 60 MG capsule Commonly known as: CYMBALTA Take 1 capsule (60 mg total) by mouth daily.   fluticasone 50 MCG/ACT nasal spray Commonly  known as: FLONASE Place 2 sprays into both nostrils in the morning and at bedtime.   FreeStyle Libre 3 Plus Sensor Misc 1 each by Does not apply route every 14 (fourteen) days.   Hair Skin and Nails Formula Tabs Take 2 tablets by mouth daily.   HYDROcodone-acetaminophen 5-325 MG tablet Commonly known as: NORCO/VICODIN Take 1-2 tablets by mouth every 6 (six) hours as needed for moderate pain (pain score 4-6).   Insulin Syringe-Needle U-100 31G X 5/16" 0.5 ML Misc Commonly known as: BD Insulin Syringe U/F Use 1x a day for insulin   ketoconazole 2 % cream Commonly known as: NIZORAL APPLY TOPICALLY TO THE AFFECTED AREA TWICE DAILY What changed:  how much to take how to take this when to take this reasons to take this additional instructions   Lantus SoloStar 100 UNIT/ML Solostar Pen Generic drug: insulin glargine Inject 12-15 Units into the skin daily. What changed: how much to take   lisinopril 10 MG tablet Commonly known as: ZESTRIL Take 1 tablet (10 mg total) by mouth daily.   metFORMIN 1000 MG tablet Commonly known as: GLUCOPHAGE Take 1 tablet (1,000 mg total) by mouth 2 (two) times daily with a meal.   methocarbamol 500 MG tablet Commonly known as: ROBAXIN Take 1 tablet (500 mg total) by mouth every 6 (six) hours as needed for muscle spasms.   metoprolol tartrate 25 MG tablet Commonly known as: LOPRESSOR TAKE 1 TABLET(25 MG) BY MOUTH TWICE DAILY.   mupirocin ointment 2 % Commonly known as: BACTROBAN Place 1 Application into the nose 2 (two) times daily for 60 doses. Use as directed 2 times daily for 5 days every other week for 6 weeks.   NovoLOG FlexPen 100 UNIT/ML FlexPen Generic drug: insulin aspart Inject 2-6 Units into the skin 3 (three) times daily with meals.   OneTouch Verio test strip Generic drug: glucose blood USE TO TEST BLOOD SUGAR TWICE A DAY DX: E11.319   sertraline 100 MG tablet Commonly known as: ZOLOFT Take 1 tablet (100 mg total) by  mouth daily.   Vitamin D (Ergocalciferol) 1.25 MG (50000 UNIT) Caps capsule Commonly known as: DRISDOL TAKE 1 CAPSULE BY MOUTH EVERY 7 DAYS        Diagnostic Studies: DG Pelvis Portable Result Date: 06/22/2023 CLINICAL DATA:  Right hip arthroplasty EXAM: PORTABLE PELVIS 1-2 VIEWS COMPARISON:  None Available. FINDINGS: Right hip arthroplasty in expected alignment. No periprosthetic lucency or fracture. Recent postsurgical change includes air and edema in the soft tissues. Lateral skin staples in place. IMPRESSION: Right hip arthroplasty without immediate postoperative complication. Electronically Signed   By: Narda Rutherford M.D.   On: 06/22/2023 15:26   DG HIP UNILAT WITH PELVIS 1V RIGHT Result Date: 06/22/2023 CLINICAL DATA:  Elective surgery. EXAM: DG HIP (WITH OR WITHOUT PELVIS) 1V RIGHT COMPARISON:  None Available. FINDINGS: Three fluoroscopic spot views of the pelvis and right hip obtained in the operating room. Images during hip arthroplasty. Fluoroscopy time 12 seconds. Dose 1.665 mGy. IMPRESSION: Intraoperative fluoroscopy during right hip arthroplasty. Electronically Signed   By: Narda Rutherford M.D.   On: 06/22/2023 15:25   DG C-Arm 1-60 Min-No Report Result Date: 06/22/2023 Fluoroscopy was utilized by the requesting physician.  No radiographic interpretation.   CT ABDOMEN PELVIS W CONTRAST Result Date: 06/05/2023 CLINICAL DATA:  Right right lower quadrant pain EXAM: CT ABDOMEN AND PELVIS WITH CONTRAST TECHNIQUE: Multidetector CT imaging of the abdomen and pelvis was performed using the standard protocol following bolus administration of intravenous contrast. RADIATION DOSE REDUCTION: This exam was performed according to the departmental dose-optimization program which includes automated exposure control, adjustment of the mA and/or kV according to patient size and/or use of iterative reconstruction technique. CONTRAST:  80mL ISOVUE-300 IOPAMIDOL (ISOVUE-300) INJECTION 61% COMPARISON:   CT abdomen and pelvis July 29, 2009 choose 1 FINDINGS: Lower chest: No acute abnormality. Hepatobiliary: No focal liver abnormality is seen. Status post cholecystectomy. No biliary dilatation. Pancreas: Unremarkable. No pancreatic ductal dilatation or surrounding inflammatory changes. Spleen: Normal in size without focal abnormality. Adrenals/Urinary Tract: Adrenal glands are unremarkable. Kidneys are normal, without renal calculi, focal lesion, or hydronephrosis. Bladder is unremarkable. Stomach/Bowel: Prior gastric lap band, lap band in good position. Port in the subcutaneous tissues of the abdomen to the right of the midline. No abnormal bowel gas. Small and large bowel loops are unremarkable. No obstruction. No inflammatory changes. Contrast did not reach the right side of the colon. Cecal appendix normal. No evidence of appendicitis. No inflammatory changes or significant adenopathy. Moderate amount of residual fecal material throughout the colon. Vascular/Lymphatic: No significant vascular findings are present. No enlarged abdominal or pelvic lymph nodes. Reproductive: Status post hysterectomy. No adnexal masses. Other: No abdominal wall hernia or abnormality. No abdominopelvic ascites. Musculoskeletal: No acute or significant osseous findings. IMPRESSION: *No acute findings in the abdomen or pelvis. No evidence of appendicitis *Prior gastric lap band, lap band in good position. *Moderate amount of residual fecal material throughout the colon. Electronically Signed   By: Shaaron Adler M.D.   On: 06/05/2023 16:05    Disposition: Discharge disposition: 01-Home or Self Care          Follow-up Information     Kathryne Hitch, MD Follow up in 2 week(s).   Specialty: Orthopedic Surgery Contact information: [address] [place] [address] [phone]         [facility] Follow up.   Why: [facility] will provide PT in the home after discharge.                  Signed: Kathryne Hitch 06/24/2023, 4:58 PM

## 2023-06-25 ENCOUNTER — Encounter (HOSPITAL_COMMUNITY): Payer: Self-pay | Admitting: Orthopaedic Surgery

## 2023-07-05 ENCOUNTER — Ambulatory Visit (INDEPENDENT_AMBULATORY_CARE_PROVIDER_SITE_OTHER): Payer: PPO | Admitting: Orthopaedic Surgery

## 2023-07-05 ENCOUNTER — Encounter: Payer: Self-pay | Admitting: Orthopaedic Surgery

## 2023-07-05 DIAGNOSIS — Z96641 Presence of right artificial hip joint: Secondary | ICD-10-CM

## 2023-07-05 MED ORDER — HYDROCODONE-ACETAMINOPHEN 5-325 MG PO TABS
1.0000 | ORAL_TABLET | Freq: Four times a day (QID) | ORAL | 0 refills | Status: DC | PRN
Start: 2023-07-05 — End: 2023-11-06

## 2023-07-05 NOTE — Progress Notes (Signed)
 The patient is a 73 year old is here for first postoperative visit status post a right total hip arthroplasty.  She is doing well.  She does have a slight bedsore and I recommended DuoDERM and even sitting on some type of cushioning.  Overall though she is doing well.  She does need a refill of hydrocodone but she is taking this sparingly.  She has been transitioning from a walker to a cane but she is in a walker today.  She denies any significant severe pain.  Examination of her right hip incision shows it looks good.  Staples and removed and strips applied.  There is just a mild seroma but nothing to really drain.  From my standpoint she will slowly increase her activities as comfort allows.  Will see her back in a month unless she is having issues.  At that visit we will see her for mobility but no x-rays are needed.

## 2023-07-30 ENCOUNTER — Ambulatory Visit (INDEPENDENT_AMBULATORY_CARE_PROVIDER_SITE_OTHER): Admitting: Orthopaedic Surgery

## 2023-07-30 ENCOUNTER — Encounter: Payer: Self-pay | Admitting: Orthopaedic Surgery

## 2023-07-30 DIAGNOSIS — Z96641 Presence of right artificial hip joint: Secondary | ICD-10-CM

## 2023-07-30 NOTE — Progress Notes (Signed)
 The patient is now 6 weeks status post a right total hip replacement.  She is an active 73 year old female and reports that she has good range of motion and strength with her hip and no issues.  Her right operative hip moves smoothly and fluidly with no blocks or rotation.  She says her ADLs are easier as well with that hip.  From my standpoint follow-up can be in 6 months and she is doing so well.  All questions and concerns were addressed and answered.  She has no restrictions from my standpoint.  At 6 months we will have a standing AP pelvis and lateral of her right operative hip.

## 2023-09-11 ENCOUNTER — Encounter: Payer: Self-pay | Admitting: Family Medicine

## 2023-09-20 ENCOUNTER — Ambulatory Visit (INDEPENDENT_AMBULATORY_CARE_PROVIDER_SITE_OTHER): Payer: PPO | Admitting: Internal Medicine

## 2023-09-20 ENCOUNTER — Encounter: Payer: Self-pay | Admitting: Internal Medicine

## 2023-09-20 VITALS — BP 112/60 | HR 86 | Ht 63.0 in | Wt 137.8 lb

## 2023-09-20 DIAGNOSIS — E11319 Type 2 diabetes mellitus with unspecified diabetic retinopathy without macular edema: Secondary | ICD-10-CM

## 2023-09-20 DIAGNOSIS — E785 Hyperlipidemia, unspecified: Secondary | ICD-10-CM

## 2023-09-20 DIAGNOSIS — E1142 Type 2 diabetes mellitus with diabetic polyneuropathy: Secondary | ICD-10-CM

## 2023-09-20 DIAGNOSIS — Z794 Long term (current) use of insulin: Secondary | ICD-10-CM

## 2023-09-20 LAB — POCT GLYCOSYLATED HEMOGLOBIN (HGB A1C): Hemoglobin A1C: 6.7 % — AB (ref 4.0–5.6)

## 2023-09-20 MED ORDER — NOVOLOG FLEXPEN 100 UNIT/ML ~~LOC~~ SOPN: 2.0000 [IU] | PEN_INJECTOR | Freq: Three times a day (TID) | SUBCUTANEOUS | 3 refills | Status: AC

## 2023-09-20 MED ORDER — BD PEN NEEDLE NANO 2ND GEN 32G X 4 MM MISC: 3 refills | Status: AC

## 2023-09-20 MED ORDER — LANTUS SOLOSTAR 100 UNIT/ML ~~LOC~~ SOPN: 9.0000 [IU] | PEN_INJECTOR | Freq: Every day | SUBCUTANEOUS | 3 refills | Status: AC

## 2023-09-20 NOTE — Progress Notes (Signed)
 Patient ID: Priscilla [name], female   DOB: Jun 03, 1950, 73 y.o.   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 She previously saw Drs. Hubert Madden (distant past) and [place] . Last visit with me 4.5 months ago.  Interim history: She continues to have a lot of stress at home due to her husband who has dementia - progressive - he is bedbound.  She had to move in with her daughter before last visit as he was not able to take care of him by herself.  She continues to live with her daughter.  She is eating more takeout and fast food. She has urinary incontinence, no blurry vision, chest pain.  She also has tremors.  She has cervical dystonia. At last visit she had right hip pain and getting steroid injections.  She had right [facility] 06/22/2023.  She is doing well after surgery, without pain anymore.  Reviewed HbA1c levels: Lab Results  Component Value Date   HGBA1C 7.1 (A) 05/07/2023   HGBA1C 6.8 12/14/2022   HGBA1C 7.2 (A) 08/17/2022   HGBA1C 7.0 (A) 04/18/2022   HGBA1C 6.9 (A) 12/14/2021   HGBA1C 7.0 (A) 08/09/2021   HGBA1C 6.6 (A) 03/07/2021   HGBA1C 6.7 (A) 10/29/2020   HGBA1C 6.2 (A) 06/07/2020   HGBA1C 6.3 (A) 12/30/2019   HGBA1C 6.3 (A) 08/25/2019   HGBA1C 5.9 (A) 04/24/2019   HGBA1C 6.0 (A) 02/13/2019   HGBA1C 6.1 (A) 09/20/2018   HGBA1C 6.4 (A) 05/23/2018  03/16/2017: HbA1c calculated from fructosamine: 5.6% 10/18/2015: HbA1c calculated from fructosamine: 5.7% 02/2015: HbA1c 7.4%, HbA1c calculated from fructosamine: 5.6%  She is on: - Metformin  1000 mg 2x a day with meals - Levemir  30 >> .Aaron Aas. 15  >> Lantus  15 >> 12  in am - Mealtime Novolog :  4-6 before meals (mostly uses 4 units) >>  4-6 units 15 minutes before larger meals (takes this 4x a week, on ave.) >> not using b/c not eating well - Ozempic  0.25 >> 0.5 mg weekly-started 08/2022 >>  stopped 06/2023 She was on Victoza 2.5 mg daily >> stopped as she could not afford this.  Pt.checks her sugars more than 4 times a day with her CGM (with receiver):  Previously:  Previously:   Lowest sugar was 42 >> ...  70 >> 50s >> 57 >> 60s; she has hypoglycemia awareness in the in the 70s. Highest sugar was 300 (steroids) >> ... 265 >> <200 >> 240 >> 200s.  Glucometer: Freestyle  -No CKD; last BUN/creatinine:  Lab Results  Component Value Date   BUN 22 06/23/2023   CREATININE 0.79 06/23/2023   Lab Results  Component Value Date   MICRALBCREAT 7 05/07/2023   MICRALBCREAT 1.0 04/18/2022   MICRALBCREAT 0.9 05/06/2021   MICRALBCREAT 0.2 09/26/2013  On lisinopril  1.25 mg daily  -+ HL; last set of lipids: Lab Results  Component Value Date   CHOL 128 05/21/2023   HDL 60.10 05/21/2023   LDLCALC 47 05/21/2023   TRIG 105.0 05/21/2023   CHOLHDL 2 05/21/2023  On pravastatin  40.  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  She has floaters.  -+ numbness, tingling, burning in her feet - stable.  On B12.  I recommended alpha lipoic acid but she did not take this due to the large size  of the pill.  Previously on Neurontin  100 mg daily per podiatry, now seldom, 2/2 being also on Ambien .  Last foot exam 08/17/2022.  She was previously on Lamisil  and this helped with the fungus on her left toenails, but not the right.  Latest TSH normal: Lab Results  Component Value Date   TSH 3.12 05/21/2023   She has a history of lap band surgery >> she does not usually eat large meals but she usually grazes, especially at night She has neck pain and dizziness and was diagnosed with possible cervical dystonia.  She sees neurology. She has hip pain >> had steroid inj's.  No personal history of pancreatitis or family history of medullary thyroid  cancer.  ROS: + See HPI  I reviewed pt's medications, allergies, PMH, social hx,  family hx, and changes were documented in the history of present illness. Otherwise, unchanged from my initial visit note.  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 Daisey Dryer   TOTAL HIP ARTHROPLASTY Right 06/22/2023   Procedure: RIGHT TOTAL HIP ARTHROPLASTY ANTERIOR APPROACH;  Surgeon: Arnie Lao, MD;  Location: [facility];  Service: Orthopedics;  Laterality: Right;   WRIST SURGERY     LEFT   Social History   Social History   Marital Status: Married    Spouse Name: N/A   Number of Children: N/A   Occupational History   Not on file.   Social History Main Topics   Smoking status: Never Smoker    Smokeless tobacco: Never Used   Alcohol Use: Yes     Comment: glass of wine-specially occasion   Drug Use: No   Current Outpatient Medications on File Prior to Visit  Medication Sig Dispense Refill   aspirin  81 MG chewable tablet Chew 1 tablet (81 mg total) by mouth 2 (two) times daily. 30 tablet 0   atorvastatin  (LIPITOR) 40 MG tablet Take 1  tablet (40 mg total) by mouth daily. 90 tablet 3   baclofen  (LIORESAL ) 10 MG tablet Take 0.5-1 tablets (5-10 mg total) by mouth in the morning and at bedtime. (Patient taking differently: Take 10 mg by mouth at bedtime.) 180 each 1   BD PEN NEEDLE NANO 2ND GEN 32G X 4 MM MISC USE AS DIRECTED THREE TIMES DAILY FOR INSULIN  300 each 3   chlorhexidine  (HIBICLENS ) 4 % external liquid Apply 15 mLs (1 Application total) topically as directed for 30 doses. Use as directed daily for 5  days every other week for 6 weeks. 946 mL 1   COLLAGEN PO Take 1 Scoop by mouth daily.     Continuous Glucose Sensor (FREESTYLE LIBRE 3 PLUS SENSOR) MISC 1 each by Does not apply route every 14 (fourteen) days. 6 each 3   DULoxetine  (CYMBALTA ) 60 MG capsule Take 1 capsule (60 mg total) by mouth daily. 90 capsule 1   fluticasone  (FLONASE ) 50 MCG/ACT nasal spray Place 2 sprays into both nostrils in the morning and at bedtime. (Patient taking differently: Place 2 sprays into both nostrils 2 (two) times daily as needed for allergies.) 16 g 6   glucose blood (ONETOUCH VERIO) test strip USE TO TEST BLOOD SUGAR TWICE A DAY DX: E11.319 150 each 3   HYDROcodone -acetaminophen  (NORCO/VICODIN) 5-325 MG tablet Take 1-2 tablets by mouth every 6 (six) hours as needed for moderate pain (pain score 4-6). 30 tablet 0   insulin  glargine (LANTUS  SOLOSTAR) 100 UNIT/ML Solostar Pen Inject 12-15 Units into the skin daily. (Patient taking differently: Inject 15 Units into the skin daily.) 15 mL 5   Insulin  Syringe-Needle U-100 (BD INSULIN  SYRINGE U/F) 31G X 5/16 0.5 ML MISC Use 1x a day for insulin  100 each 3   ketoconazole  (NIZORAL ) 2 % cream APPLY TOPICALLY TO THE AFFECTED AREA TWICE DAILY (Patient taking differently: Apply 1 Application topically 2 (two) times daily as needed for irritation.) 60 g 3   lisinopril  (ZESTRIL ) 10 MG tablet Take 1 tablet (10 mg total) by mouth daily. 90 tablet 1   metFORMIN  (GLUCOPHAGE ) 1000 MG tablet Take 1 tablet (1,000  mg total) by mouth 2 (two) times daily with a meal. 180 tablet 3   methocarbamol  (ROBAXIN ) 500 MG tablet Take 1 tablet (500 mg total) by mouth every 6 (six) hours as needed for muscle spasms. 30 tablet 1   metoprolol  tartrate (LOPRESSOR ) 25 MG tablet TAKE 1 TABLET(25 MG) BY MOUTH TWICE DAILY. 180 tablet 1   Multiple Vitamins-Minerals (HAIR SKIN AND NAILS FORMULA) TABS Take 2 tablets by mouth daily.     NOVOLOG  FLEXPEN 100 UNIT/ML FlexPen Inject 2-6 Units into the skin 3 (three) times daily with meals. 30 mL 1   sertraline  (ZOLOFT ) 100 MG tablet Take 1 tablet (100 mg total) by mouth daily. 90 tablet 1   Vitamin D , Ergocalciferol , (DRISDOL ) 1.25 MG (50000 UNIT) CAPS capsule TAKE 1 CAPSULE BY MOUTH EVERY 7 DAYS 4 capsule 6   No current facility-administered medications on file prior to visit.   No Known Allergies Family History  Problem Relation Age of Onset   Arthritis Mother    Alzheimer's disease Mother    Stroke Father    Alcohol abuse Father    Diabetes Father    Heart disease Father    Early death Father 72   Diabetes Sister    Arthritis Sister    COPD Sister    Arthritis Maternal Aunt    Diabetes Maternal Aunt    Early death Maternal Aunt 69   Diabetes Maternal Uncle    Cancer Paternal Aunt    COPD Paternal Aunt    Heart disease Paternal Aunt    Heart disease Paternal Uncle    Alcohol abuse Paternal Grandfather    Heart disease Paternal Grandfather    Early death Paternal Grandfather 9   Stroke Paternal Grandfather    Hypertension Son    Autoimmune disease Daughter    Breast cancer Neg Hx    PE: BP 112/60   Pulse 86   Ht  5' 3 (1.6 m)   Wt 137 lb 12.8 oz (62.5 kg)   LMP  (LMP Unknown)   SpO2 98%   BMI 24.41 kg/m   Wt Readings from Last 3 Encounters:  09/20/23 137 lb 12.8 oz (62.5 kg)  06/22/23 137 lb (62.1 kg)  06/11/23 137 lb (62.1 kg)   Constitutional: Normal weight, in NAD Eyes: EOMI, no exophthalmos ENT: no thyromegaly, no cervical  lymphadenopathy Cardiovascular: RRR, No MRG Respiratory: CTA B Musculoskeletal: no deformities Skin: no rashes Neurological: + tremor with outstretched hands Diabetic Foot Exam - Simple   Simple Foot Form Diabetic Foot exam was performed with the following findings: Yes 09/20/2023  2:40 PM  Visual Inspection See comments: Yes Sensation Testing Intact to touch and monofilament testing bilaterally: Yes Pulse Check Posterior Tibialis and Dorsalis pulse intact bilaterally: Yes Comments  Erythematous discoloration B feet Onychomycosis R foot    ASSESSMENT: 1. DM2, insulin -dependent, uncontrolled, with complications - Diabetic retinopathy left eye - resolved - Peripheral neuropathy  Component     Latest Ref Rng & Units 04/24/2019  Hemoglobin A1C     4.0 - 5.6 % 5.9 (A)  Islet Cell Ab     Neg:<1:1 Negative  ZNT8 Antibodies     U/mL <15  C-Peptide     0.80 - 3.85 ng/mL 2.35  Glucose, Plasma     65 - 99 mg/dL 161 (H)  Glutamic Acid Decarb Ab     <5 IU/mL <5  No insulin  deficiency or pancreatic autoimmunity.  2. HL  3. PN   PLAN:  1. Patient with longstanding, previously uncontrolled, type 2 diabetes, on metformin , weekly GLP-1 receptor agonist and basal-bolus insulin  regimen, with an increase in HbA1c at last visit, at 7.1%, increased from 6.8%.  Sugars appears to be well-controlled, increasing slightly after lunch and less so after dinner but the vast majority of the blood sugars were at goal so I did not suggest a change in regimen.  She had some low blood sugars in the 50s during the night but I suspected that these were compression lows.  We did discuss that if she does see more lows (verified with the CGM), to decrease the dose of Lantus . CGM interpretation: -At today's visit, we reviewed her CGM downloads: It appears that 90% of values are in target range (goal >70%), while 10% are higher than 180 (goal <25%), and 0% are lower than 70 (goal <4%).  The calculated average  blood sugar is 129.  The projected HbA1c for the next 3 months (GMI) is 6.4%. -Reviewing the CGM trends, sugars appear to be mostly at goal but with hyperglycemic spikes particularly after breakfast and lunch.  Upon questioning, her daughter is providing her food and she is not able to cook.  Have a lot of fast food.  She has not been using NovoLog  in most days.  Also, she had stopped Ozempic  before her surgery and has not resumed it.  As of now, I do not feel we need to add back Ozempic , especially since her weight is at goal and I do not feel she benefited quite that much from it.  Since she does describe occasional low blood sugars overnight, I advised her to decrease the Lantus  dose further and we discussed about getting 2 to 4 units of NovoLog  before larger meals (discussed how to quantify them) and to continue forming.  I refilled her insulin  prescriptions. - I advised her to:  Patient Instructions  Please continue: - Metformin  1000 mg  2x a day with meals  Decrease: - Lantus  9 units daily  Try to take: - Novolog  2-4 units only 15 min before a larger meal  Please come back for a follow-up appointment in 4 months.  - we checked her HbA1c: 6.7% (improved) - advised to check sugars at different times of the day - 4x a day, rotating check times - advised for yearly eye exams >> she is UTD - return to clinic in 4 months   2. HL - Latest lipid panel showed fractions at goal: Lab Results  Component Value Date   CHOL 128 05/21/2023   HDL 60.10 05/21/2023   LDLCALC 47 05/21/2023   TRIG 105.0 05/21/2023   CHOLHDL 2 05/21/2023  -She is on atorvastatin  40 mg daily without side effect  3. PN - Due to diabetes - Stable -I previously suggested alpha lipoic acid but she could not take it due to the large tablet size -She is on B12 vitamin, Cymbalta , and gabapentin -prescribed by podiatry.  These are helping.  Emilie Harden, MD PhD [facility]

## 2023-09-20 NOTE — Patient Instructions (Addendum)
 Please continue: - Metformin  1000 mg 2x a day with meals  Decrease: - Lantus  9 units daily  Try to take: - Novolog  2-4 units only 15 min before a larger meal  Please come back for a follow-up appointment in 4 months.

## 2023-09-21 NOTE — Addendum Note (Signed)
 Addended by: Vernon Goodpasture on: 09/21/2023 07:57 AM   Modules accepted: Orders

## 2023-11-06 ENCOUNTER — Encounter: Payer: Self-pay | Admitting: Family Medicine

## 2023-11-06 ENCOUNTER — Ambulatory Visit (INDEPENDENT_AMBULATORY_CARE_PROVIDER_SITE_OTHER): Payer: PPO | Admitting: Family Medicine

## 2023-11-06 VITALS — BP 108/68 | HR 82 | Temp 98.2°F | Wt 139.4 lb

## 2023-11-06 DIAGNOSIS — R5383 Other fatigue: Secondary | ICD-10-CM

## 2023-11-06 LAB — VITAMIN D 25 HYDROXY (VIT D DEFICIENCY, FRACTURES): VITD: 55.06 ng/mL (ref 30.00–100.00)

## 2023-11-06 LAB — IBC + FERRITIN
Ferritin: 19.5 ng/mL (ref 10.0–291.0)
Iron: 71 ug/dL (ref 42–145)
Saturation Ratios: 17.6 % — ABNORMAL LOW (ref 20.0–50.0)
TIBC: 403.2 ug/dL (ref 250.0–450.0)
Transferrin: 288 mg/dL (ref 212.0–360.0)

## 2023-11-06 MED ORDER — SERTRALINE HCL 100 MG PO TABS: 100.0000 mg | ORAL_TABLET | Freq: Every day | ORAL | 1 refills | Status: DC

## 2023-11-06 MED ORDER — LISINOPRIL 10 MG PO TABS: 10.0000 mg | ORAL_TABLET | Freq: Every day | ORAL | 1 refills | Status: DC

## 2023-11-06 MED ORDER — BACLOFEN 10 MG PO TABS: 10.0000 mg | ORAL_TABLET | Freq: Every day | ORAL | 1 refills | Status: DC

## 2023-11-06 MED ORDER — METOPROLOL TARTRATE 25 MG PO TABS: ORAL_TABLET | ORAL | 1 refills | Status: DC

## 2023-11-06 MED ORDER — DULOXETINE HCL 60 MG PO CPEP: 60.0000 mg | ORAL_CAPSULE | Freq: Every day | ORAL | 1 refills | Status: DC

## 2023-11-06 NOTE — Progress Notes (Signed)
 Patient Care Team    Relationship Specialty Notifications Start End  Catherine Charlies LABOR, DO PCP - General Family Medicine  08/29/21   Addie Cordella Hamilton, MD Consulting Physician Orthopedic Surgery  08/06/15   Trixie File, MD Consulting Physician Internal Medicine  08/06/15    Comment: endocrine  Vernetta Lonni GRADE, MD Consulting Physician Orthopedic Surgery  08/06/15   Dolphus Reiter, MD Consulting Physician Rheumatology  08/06/15   Timmy Maude SAUNDERS, MD Consulting Physician Oncology  11/10/16   Eldonna Novel, MD Consulting Physician Physical Medicine and Rehabilitation  11/10/16   Dermatology, [facility]    11/10/16    Comment: Dr. [place]  Hyatt, Royden DASEN, [place] Consulting Physician Podiatry  12/03/18   Tat, Asberry RAMAN, DO Consulting Physician Neurology  08/25/21      SUBJECTIVE Chief Complaint  Patient presents with   Hypertension    HPI: Priscilla [name] is a 73 y.o. female present for Chronic Conditions/illness Management  Hypertension/hyperlipidemia: Pt reports compliance with metoprolol  25 mg BID and lisinopril  10 mg daily Patient denies chest pain, shortness of breath, dizziness or lower extremity edema.   She reports compliance with baby aspirin  and pravastatin . Diet: low sodium.  Exercise: routinely.  RF: HTN, HLD, DM, FHx HD   Peripheral neuropathy/depression/anxiety: Patient reports compliance with Lyrica , Cymbalta  60 mg a day and zoloft  100 mg qd.  She reports the combination has been working well her depression, anxiety and neuropathy.   She is still having significant neck discomfort.  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  She is established with Ortho and PMR for her cervical degeneration and radiculopathy.      11/06/2023    9:03 AM 03/21/2023    2:36 PM 03/15/2022    3:02 PM 10/21/2021    9:31 AM 08/15/2021   10:08 AM  Depression screen PHQ 2/9  Decreased Interest 1 0 0 1 1  Down, Depressed, Hopeless 3 3 0 1 1  PHQ - 2 Score 4 3 0 2 2   Altered sleeping 1 0  0   Tired, decreased energy 2 3  3    Change in appetite 2 0  0   Feeling bad or failure about yourself  0 0  0   Trouble concentrating 2 1  0   Moving slowly or fidgety/restless 1 0  0   Suicidal thoughts 0 0  0   PHQ-9 Score 12 7  5    Difficult doing work/chores Somewhat difficult Somewhat difficult         11/06/2023    9:03 AM 10/21/2021    9:31 AM 10/15/2020    2:38 PM 09/23/2019    3:35 PM  GAD 7 : Generalized Anxiety Score  Nervous, Anxious, on Edge 3 3 2 3   Control/stop worrying 3 2 2 3   Worry too much - different things 3 3 2 3   Trouble relaxing 3 2 0 0  Restless 1 0 0 0  Easily annoyed or irritable 1 1 1  0  Afraid - awful might happen 3 1 0 0  Total GAD 7 Score 17 12 7 9   Anxiety Difficulty Somewhat difficult   Not difficult at all    Patient Active Problem List   Diagnosis Date Noted   Status post total replacement of right hip 06/22/2023   Unilateral primary osteoarthritis, right hip 06/21/2023   Unilateral primary osteoarthritis, right knee 05/09/2023   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   Neck pain 08/16/2021   Decreased circulation right great toe  05/28/2020   Pain in right hip 11/27/2019   Vitamin D  deficiency 10/13/2016   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   Hx of laparoscopic gastric banding 08/31/2016   Iron  deficiency anemia 04/28/2016   Other insomnia 03/14/2016   DDD (degenerative disc disease), cervical 03/14/2016   Spondylosis without myelopathy or radiculopathy, lumbar region 01/31/2016   Anxiety and depression 06/25/2014   Psoriatic arthritis - sees rheumatologist 02/20/2014   Asthma 12/14/2008   Diabetic peripheral neuropathy associated with type 2 diabetes mellitus (HCC) 08/06/2008   Hyperlipemia 11/12/2006   Essential hypertension 11/12/2006    Social History   Tobacco Use   Smoking status: Never    Passive exposure: Past   Smokeless tobacco: Never  Substance Use Topics   Alcohol use: Yes    Comment: glass of  wine-special occasion    Current Outpatient Medications:    aspirin  81 MG chewable tablet, Chew 1 tablet (81 mg total) by mouth 2 (two) times daily., Disp: 30 tablet, Rfl: 0   atorvastatin  (LIPITOR) 40 MG tablet, Take 1 tablet (40 mg total) by mouth daily., Disp: 90 tablet, Rfl: 3   COLLAGEN PO, Take 1 Scoop by mouth daily., Disp: , Rfl:    Continuous Glucose Receiver (FREESTYLE LIBRE 3 READER) DEVI, USE AS DIRECTED TO MONITOR BLOOD GLUCOSE, Disp: , Rfl:    Continuous Glucose Sensor (FREESTYLE LIBRE 3 PLUS SENSOR) MISC, 1 each by Does not apply route every 14 (fourteen) days., Disp: 6 each, Rfl: 3   fluticasone  (FLONASE ) 50 MCG/ACT nasal spray, Place 2 sprays into both nostrils in the morning and at bedtime. (Patient taking differently: Place 2 sprays into both nostrils 2 (two) times daily as needed for allergies.), Disp: 16 g, Rfl: 6   insulin  glargine (LANTUS  SOLOSTAR) 100 UNIT/ML Solostar Pen, Inject 9-10 Units into the skin daily., Disp: 15 mL, Rfl: 3   Insulin  Pen Needle (BD PEN NEEDLE NANO 2ND GEN) 32G X 4 MM MISC, Use 1-2x a day, Disp: 200 each, Rfl: 3   Insulin  Syringe-Needle U-100 (BD INSULIN  SYRINGE U/F) 31G X 5/16 0.5 ML MISC, Use 1x a day for insulin , Disp: 100 each, Rfl: 3   ketoconazole  (NIZORAL ) 2 % cream, APPLY TOPICALLY TO THE AFFECTED AREA TWICE DAILY (Patient taking differently: Apply 1 Application topically 2 (two) times daily as needed for irritation.), Disp: 60 g, Rfl: 3   metFORMIN  (GLUCOPHAGE ) 1000 MG tablet, Take 1 tablet (1,000 mg total) by mouth 2 (two) times daily with a meal., Disp: 180 tablet, Rfl: 3   Multiple Vitamins-Minerals (HAIR SKIN AND NAILS FORMULA) TABS, Take 2 tablets by mouth daily., Disp: , Rfl:    NOVOLOG  FLEXPEN 100 UNIT/ML FlexPen, Inject 2-4 Units into the skin 3 (three) times daily with meals., Disp: 15 mL, Rfl: 3   Semaglutide ,0.25 or 0.5MG /DOS, (OZEMPIC , 0.25 OR 0.5 MG/DOSE,) 2 MG/1.5ML SOPN, Inject 0.5 mg into the skin once a week., Disp: , Rfl:     baclofen  (LIORESAL ) 10 MG tablet, Take 1 tablet (10 mg total) by mouth at bedtime., Disp: 180 tablet, Rfl: 1   DULoxetine  (CYMBALTA ) 60 MG capsule, Take 1 capsule (60 mg total) by mouth daily., Disp: 90 capsule, Rfl: 1   lisinopril  (ZESTRIL ) 10 MG tablet, Take 1 tablet (10 mg total) by mouth daily., Disp: 90 tablet, Rfl: 1   metoprolol  tartrate (LOPRESSOR ) 25 MG tablet, TAKE 1 TABLET(25 MG) BY MOUTH TWICE DAILY., Disp: 180 tablet, Rfl: 1   sertraline  (ZOLOFT ) 100 MG tablet, Take 1 tablet (100 mg total)  by mouth daily., Disp: 90 tablet, Rfl: 1  No Known Allergies  OBJECTIVE: BP 108/68   Pulse 82   Temp 98.2 F (36.8 C)   Wt 139 lb 6.4 oz (63.2 kg)   LMP  (LMP Unknown)   SpO2 95%   BMI 24.69 kg/m  Physical Exam Vitals and nursing note reviewed.  Constitutional:      General: She is not in acute distress.    Appearance: Normal appearance. She is not ill-appearing, toxic-appearing or diaphoretic.  HENT:     Head: Normocephalic and atraumatic.  Eyes:     General: No scleral icterus.       Right eye: No discharge.        Left eye: No discharge.     Extraocular Movements: Extraocular movements intact.     Conjunctiva/sclera: Conjunctivae normal.     Pupils: Pupils are equal, round, and reactive to light.  Cardiovascular:     Rate and Rhythm: Normal rate and regular rhythm.     Heart sounds: No murmur heard. Pulmonary:     Effort: Pulmonary effort is normal. No respiratory distress.     Breath sounds: Normal breath sounds. No wheezing, rhonchi or rales.  Musculoskeletal:     Cervical back: Neck supple.     Right lower leg: No edema.     Left lower leg: No edema.  Skin:    General: Skin is warm.     Findings: No rash.  Neurological:     Mental Status: She is alert and oriented to person, place, and time. Mental status is at baseline.     Motor: No weakness.     Gait: Gait normal.  Psychiatric:        Mood and Affect: Mood normal.        Behavior: Behavior normal.         Thought Content: Thought content normal.        Judgment: Judgment normal.     ASSESSMENT AND PLAN: AJLA MCGEACHY is a 73 y.o. female present for Chronic Conditions/illness Management Essential hypertension/palpitations/HLD/overweight Stable Continue metoprolol  25 mg daily Continue lisinopril  10 mg daily  Continue liptior  - low sodium. Routine exercise.  - con't baby ASA Labs: UTD due next visit  Vitamin D  deficiency: Vitamin D -UTD due next visit Continue supplementation   Fatigue/hair loss: Vit d and iron  levels collected today Will need guidance on daily dosing.  Discussed possible cause of hairloss- stress, recent surgery, female pattern baldness Anxiety and depression/neuropathy/neck pain Stable Continue Cymbalta  60 mg daily Continue Zoloft  100 mg.   Continue baclofen  10 mg twice daily  type 2 diabetes mellitus with retinopathy, with long-term current use of insulin  (HCC) Managed by endocrinology.   A1c 6.7-6/03/2024 Urine microalb UTD 04/2023 Eye exam: UTD 12/2022-reminded patient eye exam is coming up Influenza vaccine: UTD 2024 Pneumonia vaccine: Completed GFR: Collected  05/21/2023  Orders Placed This Encounter  Procedures   IBC + Ferritin   Vitamin D  (25 hydroxy)    Meds ordered this encounter  Medications   DULoxetine  (CYMBALTA ) 60 MG capsule    Sig: Take 1 capsule (60 mg total) by mouth daily.    Dispense:  90 capsule    Refill:  1   lisinopril  (ZESTRIL ) 10 MG tablet    Sig: Take 1 tablet (10 mg total) by mouth daily.    Dispense:  90 tablet    Refill:  1   metoprolol  tartrate (LOPRESSOR ) 25 MG tablet    Sig: TAKE  1 TABLET(25 MG) BY MOUTH TWICE DAILY.    Dispense:  180 tablet    Refill:  1   sertraline  (ZOLOFT ) 100 MG tablet    Sig: Take 1 tablet (100 mg total) by mouth daily.    Dispense:  90 tablet    Refill:  1   baclofen  (LIORESAL ) 10 MG tablet    Sig: Take 1 tablet (10 mg total) by mouth at bedtime.    Dispense:  180 tablet     Refill:  1    Referral Orders  No referral(s) requested today     Charlies Bellini, DO 11/06/2023

## 2023-11-06 NOTE — Patient Instructions (Addendum)

## 2023-11-07 ENCOUNTER — Ambulatory Visit: Payer: Self-pay | Admitting: Family Medicine

## 2024-01-14 DIAGNOSIS — H5203 Hypermetropia, bilateral: Secondary | ICD-10-CM | POA: Diagnosis not present

## 2024-01-14 DIAGNOSIS — H25813 Combined forms of age-related cataract, bilateral: Secondary | ICD-10-CM | POA: Diagnosis not present

## 2024-01-14 DIAGNOSIS — H52223 Regular astigmatism, bilateral: Secondary | ICD-10-CM | POA: Diagnosis not present

## 2024-01-14 DIAGNOSIS — H524 Presbyopia: Secondary | ICD-10-CM | POA: Diagnosis not present

## 2024-01-14 DIAGNOSIS — E113293 Type 2 diabetes mellitus with mild nonproliferative diabetic retinopathy without macular edema, bilateral: Secondary | ICD-10-CM | POA: Diagnosis not present

## 2024-01-14 DIAGNOSIS — H43813 Vitreous degeneration, bilateral: Secondary | ICD-10-CM | POA: Diagnosis not present

## 2024-01-14 DIAGNOSIS — H40022 Open angle with borderline findings, high risk, left eye: Secondary | ICD-10-CM | POA: Diagnosis not present

## 2024-01-22 ENCOUNTER — Ambulatory Visit: Admitting: Internal Medicine

## 2024-01-28 ENCOUNTER — Other Ambulatory Visit (INDEPENDENT_AMBULATORY_CARE_PROVIDER_SITE_OTHER): Payer: Self-pay

## 2024-01-28 ENCOUNTER — Encounter: Payer: Self-pay | Admitting: Orthopaedic Surgery

## 2024-01-28 ENCOUNTER — Ambulatory Visit: Admitting: Orthopaedic Surgery

## 2024-01-28 DIAGNOSIS — Z96641 Presence of right artificial hip joint: Secondary | ICD-10-CM

## 2024-01-28 NOTE — Progress Notes (Signed)
 The patient is now 7 months status post a right total hip arthroplasty.  She is an active 73 year old female.  Prior to surgery her x-rays showed severe bone-on-bone wear.  She is very pleased and doing well now.  She reports good range of motion and strength and she is walking without any assistive device.  She is very pleased.  On exam her right operative hip moves smoothly and fluidly as is her left hip.  Her left hip is her native hip.  Standing AP pelvis and lateral the right hip shows her right total hip arthroplasty well-seated with no complicating features.  Her left hip joint space is well-maintained.  At this point follow-up for hip can be as needed given the fact that she is doing well.  She knows the things that we need to bring her back for us  seeing her right hip and knows that we can see her for any other orthopedic issues.  All question concerns were addressed and answered.

## 2024-02-11 ENCOUNTER — Encounter: Payer: Self-pay | Admitting: Radiology

## 2024-02-22 ENCOUNTER — Ambulatory Visit: Admitting: Internal Medicine

## 2024-02-22 ENCOUNTER — Encounter: Payer: Self-pay | Admitting: Internal Medicine

## 2024-02-22 VITALS — BP 122/60 | HR 85 | Ht 63.0 in | Wt 146.2 lb

## 2024-02-22 DIAGNOSIS — E1142 Type 2 diabetes mellitus with diabetic polyneuropathy: Secondary | ICD-10-CM

## 2024-02-22 DIAGNOSIS — E785 Hyperlipidemia, unspecified: Secondary | ICD-10-CM | POA: Diagnosis not present

## 2024-02-22 DIAGNOSIS — E11319 Type 2 diabetes mellitus with unspecified diabetic retinopathy without macular edema: Secondary | ICD-10-CM | POA: Diagnosis not present

## 2024-02-22 DIAGNOSIS — Z794 Long term (current) use of insulin: Secondary | ICD-10-CM

## 2024-02-22 MED ORDER — ACCU-CHEK GUIDE W/DEVICE KIT: PACK | 0 refills | Status: AC

## 2024-02-22 MED ORDER — ACCU-CHEK SOFTCLIX LANCETS MISC
3 refills | Status: AC
Start: 2024-02-22 — End: ?

## 2024-02-22 MED ORDER — GLUCOSE BLOOD VI STRP
ORAL_STRIP | 3 refills | Status: AC
Start: 2024-02-22 — End: ?

## 2024-02-22 NOTE — Patient Instructions (Addendum)
 Please continue: - Metformin  1000 mg 2x a day with meals  Stop: - Lantus  - Novolog  (may need 3-4 units before a large meal)  Try to get the new glucometer.  Please come back for a follow-up appointment in 4 months.

## 2024-02-22 NOTE — Progress Notes (Signed)
 Patient ID: ESPERANSA SARABIA, female   DOB: 08-14-1950, 73 y.o.   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 She previously saw Drs. Von (distant past) and [place] . Last visit with me 5 months ago.  Interim history: She continues to have a lot of stress at home due to her husband who has dementia - progressive - he is bedbound, on hospice.  She had to move in with her daughter as he was not able to take care of him by herself.  She continues to live with her daughter.  At last visit she was eating more takeout and fast food. She has urinary incontinence, but no blurry vision, chest pain, nausea.  She also has tremors.  She has cervical dystonia.  Reviewed HbA1c levels: Lab Results  Component Value Date   HGBA1C 6.7 (A) 09/20/2023   HGBA1C 7.1 (A) 05/07/2023   HGBA1C 6.8 12/14/2022   HGBA1C 7.2 (A) 08/17/2022   HGBA1C 7.0 (A) 04/18/2022   HGBA1C 6.9 (A) 12/14/2021   HGBA1C 7.0 (A) 08/09/2021   HGBA1C 6.6 (A) 03/07/2021   HGBA1C 6.7 (A) 10/29/2020   HGBA1C 6.2 (A) 06/07/2020   HGBA1C 6.3 (A) 12/30/2019   HGBA1C 6.3 (A) 08/25/2019   HGBA1C 5.9 (A) 04/24/2019   HGBA1C 6.0 (A) 02/13/2019   HGBA1C 6.1 (A) 09/20/2018  03/16/2017: HbA1c calculated from fructosamine: 5.6% 10/18/2015: HbA1c calculated from fructosamine: 5.7% 02/2015: HbA1c 7.4%, HbA1c calculated from fructosamine: 5.6%  She is on: - Metformin  1000 mg 2x a day with meals - Levemir  30 >> .SABRA. 15  >> Lantus  15 >> 12 >> 9 >> 7-8 units units in am - Mealtime Novolog :  4-6 units 15 minutes before larger meals (takes this 4x a week, on ave.) >> off b/c not eating well >> 2 to 4 units before larger meals She was on Victoza 2.5 mg daily >> stopped as she could not afford this. She started Ozempic  08/2021 but stopped in 06/2023 (0.5 mg weekly).  Pt.checks her sugars more than 4  times a day with her CGM (with receiver):  Previously:  Previously:  Lowest sugar was 42 >> ... 57 >> 60s >> ; she has hypoglycemia awareness in the in the 70s. Highest sugar was 300 (steroids) >> ... 240 >> 200s >> 200s.  Glucometer: Freestyle  -No CKD; last BUN/creatinine:  Lab Results  Component Value Date   BUN 22 06/23/2023   CREATININE 0.79 06/23/2023   Lab Results  Component Value Date   MICRALBCREAT 7 05/07/2023  On lisinopril  1.25 mg daily  -+ HL; last set of lipids: Lab Results  Component Value Date   CHOL 128 05/21/2023   HDL 60.10 05/21/2023   LDLCALC 47 05/21/2023   TRIG 105.0 05/21/2023   CHOLHDL 2 05/21/2023  On pravastatin  40.  - last eye exam was 01/14/2024: + DR reportedly; she previously had + DR OS, but no DR detected in 2018, 2019, and 09/2018.  She has floaters.  -+ numbness, tingling, burning in her feet - stable.  On B12.  I recommended alpha lipoic acid but she did not take this due to the large size of the pill.  Previously on Neurontin  100 mg daily per podiatry, now seldom, 2/2 being also on Ambien .  Last foot exam 09/20/2023. She was previously on Lamisil  and this helped with the fungus on her left toenails, but not  the right.  Latest TSH normal: Lab Results  Component Value Date   TSH 3.12 05/21/2023   She has a history of lap band surgery >> she does not usually eat large meals but she usually grazes, especially at night She has neck pain and dizziness and was diagnosed with possible cervical dystonia.  She sees neurology. She has hip pain >> had steroid inj's.  No personal history of pancreatitis or family history of medullary thyroid  cancer.  ROS: + See HPI  I reviewed pt's medications, allergies, PMH, social hx, family hx, and changes were documented in the history of present illness. Otherwise, unchanged from my initial visit note.  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 Eldonna   TOTAL HIP ARTHROPLASTY Right 06/22/2023   Procedure: RIGHT TOTAL HIP ARTHROPLASTY ANTERIOR APPROACH;  Surgeon: Vernetta Lonni GRADE, MD;  Location: [facility];  Service: Orthopedics;  Laterality: Right;   WRIST SURGERY     LEFT   Social History   Social History   Marital Status: Married    Spouse Name: N/A   Number of Children: N/A   Occupational History   Not on file.   Social History Main Topics   Smoking status: Never Smoker    Smokeless tobacco: Never Used   Alcohol Use: Yes     Comment: glass of wine-specially occasion   Drug Use: No   Current Outpatient Medications on File Prior to Visit  Medication Sig Dispense Refill   aspirin  81 MG chewable tablet Chew 1 tablet (81 mg total) by mouth 2 (two) times daily. 30 tablet 0   atorvastatin  (LIPITOR) 40 MG tablet Take 1 tablet (40 mg total) by mouth daily. 90 tablet 3   baclofen  (LIORESAL ) 10 MG tablet Take 1 tablet (10 mg total) by mouth at bedtime. 180 tablet 1   COLLAGEN PO Take 1 Scoop by mouth daily.     Continuous Glucose Receiver  (FREESTYLE LIBRE 3 READER) DEVI USE AS DIRECTED TO MONITOR BLOOD GLUCOSE     Continuous Glucose Sensor (FREESTYLE LIBRE 3 PLUS SENSOR) MISC 1 each by Does not apply route every 14 (fourteen) days. 6 each 3   DULoxetine  (CYMBALTA ) 60 MG capsule Take 1 capsule (60 mg total) by mouth daily. 90 capsule 1   fluticasone  (FLONASE ) 50 MCG/ACT nasal spray Place 2 sprays into both nostrils in the morning and at bedtime. (Patient taking differently: Place 2 sprays into both nostrils 2 (two)  times daily as needed for allergies.) 16 g 6   insulin  glargine (LANTUS  SOLOSTAR) 100 UNIT/ML Solostar Pen Inject 9-10 Units into the skin daily. 15 mL 3   Insulin  Pen Needle (BD PEN NEEDLE NANO 2ND GEN) 32G X 4 MM MISC Use 1-2x a day 200 each 3   Insulin  Syringe-Needle U-100 (BD INSULIN  SYRINGE U/F) 31G X 5/16 0.5 ML MISC Use 1x a day for insulin  100 each 3   ketoconazole  (NIZORAL ) 2 % cream APPLY TOPICALLY TO THE AFFECTED AREA TWICE DAILY (Patient taking differently: Apply 1 Application topically 2 (two) times daily as needed for irritation.) 60 g 3   lisinopril  (ZESTRIL ) 10 MG tablet Take 1 tablet (10 mg total) by mouth daily. 90 tablet 1   metFORMIN  (GLUCOPHAGE ) 1000 MG tablet Take 1 tablet (1,000 mg total) by mouth 2 (two) times daily with a meal. 180 tablet 3   metoprolol  tartrate (LOPRESSOR ) 25 MG tablet TAKE 1 TABLET(25 MG) BY MOUTH TWICE DAILY. 180 tablet 1   Multiple Vitamins-Minerals (HAIR SKIN AND NAILS FORMULA) TABS Take 2 tablets by mouth daily.     NOVOLOG  FLEXPEN 100 UNIT/ML FlexPen Inject 2-4 Units into the skin 3 (three) times daily with meals. 15 mL 3   Semaglutide ,0.25 or 0.5MG /DOS, (OZEMPIC , 0.25 OR 0.5 MG/DOSE,) 2 MG/1.5ML SOPN Inject 0.5 mg into the skin once a week.     sertraline  (ZOLOFT ) 100 MG tablet Take 1 tablet (100 mg total) by mouth daily. 90 tablet 1   No current facility-administered medications on file prior to visit.   No Known Allergies Family History  Problem Relation Age of Onset    Arthritis Mother    Alzheimer's disease Mother    Stroke Father    Alcohol abuse Father    Diabetes Father    Heart disease Father    Early death Father 55   Diabetes Sister    Arthritis Sister    COPD Sister    Arthritis Maternal Aunt    Diabetes Maternal Aunt    Early death Maternal Aunt 50   Diabetes Maternal Uncle    Cancer Paternal Aunt    COPD Paternal Aunt    Heart disease Paternal Aunt    Heart disease Paternal Uncle    Alcohol abuse Paternal Grandfather    Heart disease Paternal Grandfather    Early death Paternal Grandfather 53   Stroke Paternal Grandfather    Hypertension Son    Autoimmune disease Daughter    Breast cancer Neg Hx    PE: BP 122/60   Pulse 85   Ht 5' 3 (1.6 m)   Wt 146 lb 3.2 oz (66.3 kg)   LMP  (LMP Unknown)   SpO2 98%   BMI 25.90 kg/m   Wt Readings from Last 3 Encounters:  02/22/24 146 lb 3.2 oz (66.3 kg)  11/06/23 139 lb 6.4 oz (63.2 kg)  09/20/23 137 lb 12.8 oz (62.5 kg)   Constitutional: Normal weight, in NAD Eyes: EOMI, no exophthalmos ENT: no thyromegaly, no cervical lymphadenopathy Cardiovascular: RRR, No MRG Respiratory: CTA B Musculoskeletal: no deformities Skin: no rashes Neurological: + tremor with outstretched hands  ASSESSMENT: 1. DM2, insulin -dependent, uncontrolled, with complications - Diabetic retinopathy left eye - resolved - Peripheral neuropathy  Component     Latest Ref Rng & Units 04/24/2019  Hemoglobin A1C     4.0 - 5.6 % 5.9 (A)  Islet Cell Ab     Neg:<1:1 Negative  ZNT8 Antibodies     U/mL <15  C-Peptide     0.80 - 3.85 ng/mL 2.35  Glucose, Plasma     65 - 99 mg/dL 869 (H)  Glutamic Acid Decarb Ab     <5 IU/mL <5  No insulin  deficiency or pancreatic autoimmunity.  2. HL  3. PN   PLAN:  1. Patient with longstanding, previously uncontrolled type 2 diabetes, on metformin  and basal/bolus insulin .  She was previously on a GLP-1 receptor agonist but she stopped Ozempic  before her surgery in the  past and did not restart it.  I did not feel that she benefited quite that much from it so at last visit we continued only with metformin , Lantus  and NovoLog .  I did advise her to reduce the Lantus  since she had some lows and also to get 2 to 4 units of NovoLog  before larger meals as she has some hyperglycemic spikes occasionally after meals. CGM interpretation: -At today's visit, we reviewed her CGM downloads: It appears that 82% of values are in target range (goal >70%), while 7% are higher than 180 (goal <25%), and 9% are lower than 70 (goal <4%).  The calculated average blood sugar is 117.  The projected HbA1c for the next 3 months (GMI) is 6.1%. -Reviewing the CGM trends, sugars are frequently low throughout the day and night, with occasional hyperglycemic spikes after certain meals.  On questioning, she is using Lantus  intermittently and uses NovoLog  every other day due to the many lows.  Therefore, at today's visit I advised her to try to stop Lantus  and to only use NovoLog  before a larger meal, during the holidays.  Afterwards, I am hoping that we can stop NovoLog  completely.  I did advise her to let me know if sugars increase after these changes. -At today's visit, I called in a prescription for meter to her pharmacy as she does not have a working one.  I also downloaded the [place] app on your phone and advised her to start checking the sugars with the phone from now on.  She is excited to start doing so. - I advised her to:  Patient Instructions  Please continue: - Metformin  1000 mg 2x a day with meals  Stop: - Lantus  - Novolog  (may need 3-4 units before a large meal)  Try to get the new glucometer.  Please come back for a follow-up appointment in 4 months.  - we checked her HbA1c: 7.1% (higher than expected from blood sugars at home...). - advised to check sugars at different times of the day - 4x a day, rotating check times - advised for yearly eye exams >> she is UTD - return to  clinic in 3-4 months   2. HL - Latest lipid panel showed fractions at goal: Lab Results  Component Value Date   CHOL 128 05/21/2023   HDL 60.10 05/21/2023   LDLCALC 47 05/21/2023   TRIG 105.0 05/21/2023   CHOLHDL 2 05/21/2023  -She continues atorvastatin  40 mg daily without side effects  3. 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  These are helping.  Lela Fendt, MD PhD [facility] Endocrinology

## 2024-04-15 ENCOUNTER — Encounter: Payer: Self-pay | Admitting: Family Medicine

## 2024-04-15 DIAGNOSIS — Z1231 Encounter for screening mammogram for malignant neoplasm of breast: Secondary | ICD-10-CM

## 2024-04-22 ENCOUNTER — Ambulatory Visit: Admitting: Family Medicine

## 2024-04-22 ENCOUNTER — Encounter: Payer: Self-pay | Admitting: Family Medicine

## 2024-04-22 VITALS — BP 120/74 | HR 80 | Temp 98.1°F | Ht 63.0 in | Wt 153.0 lb

## 2024-04-22 DIAGNOSIS — E782 Mixed hyperlipidemia: Secondary | ICD-10-CM | POA: Diagnosis not present

## 2024-04-22 DIAGNOSIS — E559 Vitamin D deficiency, unspecified: Secondary | ICD-10-CM

## 2024-04-22 DIAGNOSIS — Z1231 Encounter for screening mammogram for malignant neoplasm of breast: Secondary | ICD-10-CM | POA: Diagnosis not present

## 2024-04-22 DIAGNOSIS — Z0000 Encounter for general adult medical examination without abnormal findings: Secondary | ICD-10-CM

## 2024-04-22 DIAGNOSIS — F32A Depression, unspecified: Secondary | ICD-10-CM

## 2024-04-22 DIAGNOSIS — K909 Intestinal malabsorption, unspecified: Secondary | ICD-10-CM | POA: Diagnosis not present

## 2024-04-22 DIAGNOSIS — E1142 Type 2 diabetes mellitus with diabetic polyneuropathy: Secondary | ICD-10-CM | POA: Diagnosis not present

## 2024-04-22 DIAGNOSIS — I10 Essential (primary) hypertension: Secondary | ICD-10-CM

## 2024-04-22 DIAGNOSIS — F419 Anxiety disorder, unspecified: Secondary | ICD-10-CM | POA: Diagnosis not present

## 2024-04-22 DIAGNOSIS — D508 Other iron deficiency anemias: Secondary | ICD-10-CM | POA: Diagnosis not present

## 2024-04-22 DIAGNOSIS — Z23 Encounter for immunization: Secondary | ICD-10-CM

## 2024-04-22 LAB — CBC
HCT: 38.6 % (ref 36.0–46.0)
Hemoglobin: 12.6 g/dL (ref 12.0–15.0)
MCHC: 32.7 g/dL (ref 30.0–36.0)
MCV: 87.8 fl (ref 78.0–100.0)
Platelets: 285 K/uL (ref 150.0–400.0)
RBC: 4.4 Mil/uL (ref 3.87–5.11)
RDW: 13.4 % (ref 11.5–15.5)
WBC: 8.1 K/uL (ref 4.0–10.5)

## 2024-04-22 LAB — IBC + FERRITIN
Ferritin: 10.5 ng/mL (ref 10.0–291.0)
Iron: 66 ug/dL (ref 42–145)
Saturation Ratios: 14.9 % — ABNORMAL LOW (ref 20.0–50.0)
TIBC: 443.8 ug/dL (ref 250.0–450.0)
Transferrin: 317 mg/dL (ref 212.0–360.0)

## 2024-04-22 LAB — COMPREHENSIVE METABOLIC PANEL WITH GFR
ALT: 10 U/L (ref 3–35)
AST: 11 U/L (ref 5–37)
Albumin: 4 g/dL (ref 3.5–5.2)
Alkaline Phosphatase: 69 U/L (ref 39–117)
BUN: 27 mg/dL — ABNORMAL HIGH (ref 6–23)
CO2: 31 meq/L (ref 19–32)
Calcium: 9.3 mg/dL (ref 8.4–10.5)
Chloride: 103 meq/L (ref 96–112)
Creatinine, Ser: 0.89 mg/dL (ref 0.40–1.20)
GFR: 63.75 mL/min
Glucose, Bld: 141 mg/dL — ABNORMAL HIGH (ref 70–99)
Potassium: 5 meq/L (ref 3.5–5.1)
Sodium: 139 meq/L (ref 135–145)
Total Bilirubin: 0.3 mg/dL (ref 0.2–1.2)
Total Protein: 6.5 g/dL (ref 6.0–8.3)

## 2024-04-22 LAB — LIPID PANEL
Cholesterol: 229 mg/dL — ABNORMAL HIGH (ref 28–200)
HDL: 75.8 mg/dL
LDL Cholesterol: 129 mg/dL — ABNORMAL HIGH (ref 10–99)
NonHDL: 153.62
Total CHOL/HDL Ratio: 3
Triglycerides: 122 mg/dL (ref 10.0–149.0)
VLDL: 24.4 mg/dL (ref 0.0–40.0)

## 2024-04-22 LAB — HEMOGLOBIN A1C: Hgb A1c MFr Bld: 7.6 % — ABNORMAL HIGH (ref 4.6–6.5)

## 2024-04-22 LAB — TSH: TSH: 2.91 u[IU]/mL (ref 0.35–5.50)

## 2024-04-22 LAB — MICROALBUMIN / CREATININE URINE RATIO
Creatinine,U: 114.1 mg/dL
Microalb Creat Ratio: 8.9 mg/g (ref 0.0–30.0)
Microalb, Ur: 1 mg/dL (ref 0.7–1.9)

## 2024-04-22 LAB — VITAMIN D 25 HYDROXY (VIT D DEFICIENCY, FRACTURES): VITD: 36.87 ng/mL (ref 30.00–100.00)

## 2024-04-22 MED ORDER — METOPROLOL TARTRATE 25 MG PO TABS: ORAL_TABLET | ORAL | 1 refills | Status: AC

## 2024-04-22 MED ORDER — LISINOPRIL 10 MG PO TABS: 10.0000 mg | ORAL_TABLET | Freq: Every day | ORAL | 1 refills | Status: AC

## 2024-04-22 MED ORDER — TETANUS-DIPHTH-ACELL PERTUSSIS 5-2-15.5 LF-MCG/0.5 IM SUSP: 0.5000 mL | Freq: Once | INTRAMUSCULAR | 0 refills | Status: AC

## 2024-04-22 MED ORDER — ATORVASTATIN CALCIUM 40 MG PO TABS: 40.0000 mg | ORAL_TABLET | Freq: Every day | ORAL | 3 refills | Status: DC

## 2024-04-22 MED ORDER — SERTRALINE HCL 100 MG PO TABS: 100.0000 mg | ORAL_TABLET | Freq: Every day | ORAL | 1 refills | Status: AC

## 2024-04-22 MED ORDER — BACLOFEN 10 MG PO TABS: 10.0000 mg | ORAL_TABLET | Freq: Every day | ORAL | 1 refills | Status: AC

## 2024-04-22 MED ORDER — DULOXETINE HCL 60 MG PO CPEP: 60.0000 mg | ORAL_CAPSULE | Freq: Every day | ORAL | 1 refills | Status: AC

## 2024-04-22 NOTE — Progress Notes (Addendum)
 "  Patient Care Team    Relationship Specialty Notifications Start End  Catherine Charlies LABOR, DO PCP - General Family Medicine  08/29/21   Addie Cordella Hamilton, MD Consulting Physician Orthopedic Surgery  08/06/15   Trixie File, MD Consulting Physician Internal Medicine  08/06/15    Comment: endocrine  Vernetta Lonni GRADE, MD Consulting Physician Orthopedic Surgery  08/06/15   Dolphus Reiter, MD Consulting Physician Rheumatology  08/06/15   Timmy Maude SAUNDERS, MD Consulting Physician Oncology  11/10/16   Eldonna Novel, MD Consulting Physician Physical Medicine and Rehabilitation  11/10/16   Dermatology, [facility]    11/10/16    Comment: Dr. Jordan  Hyatt, Royden DASEN, [facility] Consulting Physician Podiatry  12/03/18   Tat, Asberry RAMAN, DO Consulting Physician Neurology  08/25/21      SUBJECTIVE Chief Complaint  Patient presents with   Annual Exam    Chronic condition management Pt is fasting.  Influenza vaccine    HPI: Priscilla [name] is a 74 y.o. female present for CPE and Chronic Conditions/illness Management. Medication reconciliation completed today. Past medical history updated with any changes occur appropriate  Health maintenance:  Colonoscopy: Completed 10/23/2022 after positive Cologuard.  No polyps identified, no further colonoscopy screenings required er GI Mammogram: Completed 08/01/2022 [facility] Immunizations: Tdap- printed script, influenza- provided today, pneumonia vaccination completed, Shingrix  declined. Infectious disease screening: Hepatitis C completed. DEXA: Completed 11/25/2020-normal  Hypertension/hyperlipidemia: Pt reports compliance with metoprolol  25 mg BID and lisinopril  10 mg daily Patient denies chest pain, shortness of breath, dizziness or lower extremity edema.   She reports compliance with baby aspirin  and pravastatin . Diet: low sodium.  Exercise: routinely.  RF: HTN, HLD, DM, FHx HD   Peripheral neuropathy/depression/anxiety: Patient reports compliance with  Lyrica , Cymbalta  60 mg a day and zoloft  100 mg qd.  She reports the combination has been working well her depression, anxiety and neuropathy.   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  She is established with Ortho and PMR for her cervical degeneration and radiculopathy.      04/22/2024    2:03 PM 11/06/2023    9:03 AM 03/21/2023    2:36 PM 03/15/2022    3:02 PM 10/21/2021    9:31 AM  Depression screen PHQ 2/9  Decreased Interest 1 1 0 0 1  Down, Depressed, Hopeless 2 3 3  0 1  PHQ - 2 Score 3 4 3  0 2  Altered sleeping 1 1 0  0  Tired, decreased energy 2 2 3  3   Change in appetite 2 2 0  0  Feeling bad or failure about yourself  0 0 0  0  Trouble concentrating 2 2 1   0  Moving slowly or fidgety/restless 1 1 0  0  Suicidal thoughts 0 0 0  0  PHQ-9 Score 11 12  7   5    Difficult doing work/chores Somewhat difficult Somewhat difficult Somewhat difficult       Data saved with a previous flowsheet row definition      11/06/2023    9:03 AM 10/21/2021    9:31 AM 10/15/2020    2:38 PM 09/23/2019    3:35 PM  GAD 7 : Generalized Anxiety Score  Nervous, Anxious, on Edge 3 3 2 3   Control/stop worrying 3 2 2 3   Worry too much - different things 3 3 2 3   Trouble relaxing 3 2 0 0  Restless 1 0 0 0  Easily annoyed or irritable 1 1 1  0  Afraid - awful might happen 3 1 0 0  Total GAD 7 Score 17 12 7 9   Anxiety Difficulty Somewhat difficult   Not difficult at all      11/30/2021    9:13 AM 03/15/2022    3:03 PM 03/21/2023    2:29 PM 11/06/2023    9:03 AM 04/22/2024    9:05 AM  Fall Risk  Falls in the past year? 0 0 0 0 0  Was there an injury with Fall? 0  0  0   0  Fall Risk Category Calculator 0 0 0  0  Fall Risk Category (Retired) Low  Low      (RETIRED) Patient Fall Risk Level  Low fall risk      Patient at Risk for Falls Due to  No Fall Risks   No Fall Risks  Fall risk Follow up  Falls evaluation completed  Falls evaluation completed;Education provided;Falls prevention  discussed Falls evaluation completed Falls evaluation completed     Data saved with a previous flowsheet row definition    Patient Active Problem List   Diagnosis Date Noted   Diabetic peripheral neuropathy associated with type 2 diabetes mellitus (HCC) 08/06/2008    Priority: High   Essential hypertension 11/12/2006    Priority: High   Vitamin D  deficiency 10/13/2016    Priority: Medium    Other insomnia 03/14/2016    Priority: Medium    Spondylosis without myelopathy or radiculopathy, lumbar region 01/31/2016    Priority: Medium    Anxiety and depression 06/25/2014    Priority: Medium    Hyperlipemia 11/12/2006    Priority: Medium    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    Priority: Low   Iron  deficiency anemia 04/28/2016    Priority: Low   Status post total replacement of right hip 06/22/2023   Unilateral primary osteoarthritis, right knee 05/09/2023   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   Neck pain 08/16/2021   Decreased circulation right great toe 05/28/2020   Hx of laparoscopic gastric banding 08/31/2016   DDD (degenerative disc disease), cervical 03/14/2016   Psoriatic arthritis - sees rheumatologist 02/20/2014   Asthma 12/14/2008    Social History   Tobacco Use   Smoking status: Never    Passive exposure: Past   Smokeless tobacco: Never  Substance Use Topics   Alcohol use: Yes    Comment: glass of wine-special occasion    Current Outpatient Medications:    Accu-Chek Softclix Lancets lancets, Use as instructed 1x a day, Disp: 100 each, Rfl: 3   aspirin  81 MG chewable tablet, Chew 1 tablet (81 mg total) by mouth 2 (two) times daily., Disp: 30 tablet, Rfl: 0   Blood Glucose Monitoring Suppl (ACCU-CHEK GUIDE) w/Device KIT, Use as advised, Disp: 1 kit, Rfl: 0   COLLAGEN PO, Take 1 Scoop by mouth daily., Disp: , Rfl:    Continuous Glucose Receiver (FREESTYLE LIBRE 3 READER) DEVI, USE AS DIRECTED TO MONITOR BLOOD GLUCOSE, Disp: , Rfl:    Continuous Glucose Sensor (FREESTYLE  LIBRE 3 PLUS SENSOR) MISC, 1 each by Does not apply route every 14 (fourteen) days., Disp: 6 each, Rfl: 3   glucose blood test strip, Use as instructed 1x a day, Disp: 100 each, Rfl: 3   insulin  glargine (LANTUS  SOLOSTAR) 100 UNIT/ML Solostar Pen, Inject 9-10 Units into the skin daily., Disp: 15 mL, Rfl: 3   Insulin  Pen Needle (BD PEN NEEDLE NANO 2ND GEN) 32G X 4 MM MISC, Use 1-2x a day, Disp: 200  each, Rfl: 3   Insulin  Syringe-Needle U-100 (BD INSULIN  SYRINGE U/F) 31G X 5/16 0.5 ML MISC, Use 1x a day for insulin , Disp: 100 each, Rfl: 3   ketoconazole  (NIZORAL ) 2 % cream, APPLY TOPICALLY TO THE AFFECTED AREA TWICE DAILY (Patient taking differently: Apply 1 Application topically 2 (two) times daily as needed for irritation.), Disp: 60 g, Rfl: 3   metFORMIN  (GLUCOPHAGE ) 1000 MG tablet, Take 1 tablet (1,000 mg total) by mouth 2 (two) times daily with a meal., Disp: 180 tablet, Rfl: 3   Multiple Vitamins-Minerals (HAIR SKIN AND NAILS FORMULA) TABS, Take 2 tablets by mouth daily., Disp: , Rfl:    NOVOLOG  FLEXPEN 100 UNIT/ML FlexPen, Inject 2-4 Units into the skin 3 (three) times daily with meals., Disp: 15 mL, Rfl: 3   Tdap (ADACEL) 08-09-13.5 LF-MCG/0.5 injection, Inject 0.5 mLs into the muscle once for 1 dose., Disp: 0.5 mL, Rfl: 0   atorvastatin  (LIPITOR) 40 MG tablet, Take 1 tablet (40 mg total) by mouth daily., Disp: 90 tablet, Rfl: 3   baclofen  (LIORESAL ) 10 MG tablet, Take 1 tablet (10 mg total) by mouth at bedtime., Disp: 180 tablet, Rfl: 1   DULoxetine  (CYMBALTA ) 60 MG capsule, Take 1 capsule (60 mg total) by mouth daily., Disp: 90 capsule, Rfl: 1   lisinopril  (ZESTRIL ) 10 MG tablet, Take 1 tablet (10 mg total) by mouth daily., Disp: 90 tablet, Rfl: 1   metoprolol  tartrate (LOPRESSOR ) 25 MG tablet, TAKE 1 TABLET(25 MG) BY MOUTH TWICE DAILY., Disp: 180 tablet, Rfl: 1   sertraline  (ZOLOFT ) 100 MG tablet, Take 1 tablet (100 mg total) by mouth daily., Disp: 90 tablet, Rfl: 1  No Known  Allergies  OBJECTIVE: BP 120/74   Pulse 80   Temp 98.1 F (36.7 C)   Ht 5' 3 (1.6 m)   Wt 153 lb (69.4 kg)   LMP  (LMP Unknown)   SpO2 97%   BMI 27.10 kg/m  Physical Exam Vitals and nursing note reviewed.  Constitutional:      General: She is not in acute distress.    Appearance: Normal appearance. She is not ill-appearing, toxic-appearing or diaphoretic.  HENT:     Head: Normocephalic and atraumatic.     Right Ear: Tympanic membrane, ear canal and external ear normal. There is no impacted cerumen.     Left Ear: Tympanic membrane, ear canal and external ear normal. There is no impacted cerumen.     Nose: No congestion or rhinorrhea.     Mouth/Throat:     Mouth: Mucous membranes are moist.     Pharynx: Oropharynx is clear. No oropharyngeal exudate or posterior oropharyngeal erythema.  Eyes:     General: No scleral icterus.       Right eye: No discharge.        Left eye: No discharge.     Extraocular Movements: Extraocular movements intact.     Conjunctiva/sclera: Conjunctivae normal.     Pupils: Pupils are equal, round, and reactive to light.  Cardiovascular:     Rate and Rhythm: Normal rate and regular rhythm.     Pulses: Normal pulses.     Heart sounds: Normal heart sounds. No murmur heard.    No friction rub. No gallop.  Pulmonary:     Effort: Pulmonary effort is normal. No respiratory distress.     Breath sounds: Normal breath sounds. No stridor. No wheezing, rhonchi or rales.  Chest:     Chest wall: No tenderness.  Abdominal:  General: Abdomen is flat. Bowel sounds are normal. There is no distension.     Palpations: Abdomen is soft. There is no mass.     Tenderness: There is no abdominal tenderness. There is no right CVA tenderness, left CVA tenderness, guarding or rebound.     Hernia: No hernia is present.  Musculoskeletal:        General: No swelling, tenderness or deformity. Normal range of motion.     Cervical back: Normal range of motion and neck supple.  No rigidity or tenderness.     Right lower leg: No edema.     Left lower leg: No edema.  Lymphadenopathy:     Cervical: No cervical adenopathy.  Skin:    General: Skin is warm and dry.     Coloration: Skin is not jaundiced or pale.     Findings: No bruising, erythema, lesion or rash.  Neurological:     General: No focal deficit present.     Mental Status: She is alert and oriented to person, place, and time. Mental status is at baseline.     Cranial Nerves: No cranial nerve deficit.     Sensory: No sensory deficit.     Motor: No weakness.     Coordination: Coordination normal.     Gait: Gait normal.     Deep Tendon Reflexes: Reflexes normal.  Psychiatric:        Mood and Affect: Mood normal.        Behavior: Behavior normal.        Thought Content: Thought content normal.        Judgment: Judgment normal.     ASSESSMENT AND PLAN: CHASITY OUTTEN is a 74 y.o. female present for CPE and Chronic Conditions/illness Management Essential hypertension/palpitations/HLD/overweight Stable Continue metoprolol  25 mg daily Continue lisinopril  10 mg daily  Continue liptior  - low sodium. Routine exercise.  - con't baby ASA Labs: Collected today  Vitamin D  deficiency: Vitamin D -vitamin D  collected today Continue supplementation   Anxiety and depression/neuropathy Stable Continue Cymbalta  60 mg daily Continue Zoloft  100 mg.   Continue lyrica  75 mg BID Continue baclofen  5-10 mg twice daily  type 2 diabetes mellitus with retinopathy, with long-term current use of insulin  [facility]) Managed by endocrinology.  Urine microalb collected today Eye exam: UTD 01/2024 Influenza vaccine: UTD today 2026 Pneumonia vaccine: Completed GFR: Collected 04/22/2024 Last A1c 6.7 (09/20/2023)  Influenza vaccine needed - Flu vaccine HIGH DOSE PF(Fluzone Trivalent) Breast cancer screening by mammogram - MM 3D SCREENING MAMMOGRAM BILATERAL BREAST; Future Other iron  deficiency anemia/ Malabsorption of  iron  - IBC + Ferritin  Routine general medical examination at a health care facility (Primary) Patient was encouraged to exercise greater than 150 minutes a week. Patient was encouraged to choose a diet filled with fresh fruits and vegetables, and lean meats. AVS provided to patient today for education/recommendation on gender specific health and safety maintenance. Colonoscopy: Completed 10/23/2022 after positive Cologuard.  No polyps identified, no further colonoscopy screenings required er GI Mammogram: Completed 08/01/2022 [facility] Immunizations: Tdap- printed script, influenza- provided today, pneumonia vaccination completed, Shingrix  declined. Infectious disease screening: Hepatitis C completed. DEXA: Completed 11/25/2020-normal  Orders Placed This Encounter  Procedures   MM 3D SCREENING MAMMOGRAM BILATERAL BREAST   Flu vaccine HIGH DOSE PF(Fluzone Trivalent)   CBC   Comprehensive metabolic panel with GFR   Hemoglobin A1c   Lipid panel   TSH   Urine Microalbumin w/creat. ratio   Vitamin D  (25 hydroxy)   IBC +  Ferritin    Meds ordered this encounter  Medications   Tdap (ADACEL) 08-09-13.5 LF-MCG/0.5 injection    Sig: Inject 0.5 mLs into the muscle once for 1 dose.    Dispense:  0.5 mL    Refill:  0   atorvastatin  (LIPITOR) 40 MG tablet    Sig: Take 1 tablet (40 mg total) by mouth daily.    Dispense:  90 tablet    Refill:  3   baclofen  (LIORESAL ) 10 MG tablet    Sig: Take 1 tablet (10 mg total) by mouth at bedtime.    Dispense:  180 tablet    Refill:  1   DULoxetine  (CYMBALTA ) 60 MG capsule    Sig: Take 1 capsule (60 mg total) by mouth daily.    Dispense:  90 capsule    Refill:  1   lisinopril  (ZESTRIL ) 10 MG tablet    Sig: Take 1 tablet (10 mg total) by mouth daily.    Dispense:  90 tablet    Refill:  1   metoprolol  tartrate (LOPRESSOR ) 25 MG tablet    Sig: TAKE 1 TABLET(25 MG) BY MOUTH TWICE DAILY.    Dispense:  180 tablet    Refill:  1   sertraline  (ZOLOFT ) 100 MG  tablet    Sig: Take 1 tablet (100 mg total) by mouth daily.    Dispense:  90 tablet    Refill:  1    Referral Orders  No referral(s) requested today     Charlies Bellini, DO 04/22/2024   "

## 2024-04-22 NOTE — Patient Instructions (Addendum)
 Return in about 24 weeks (around 10/07/2024) for Routine chronic condition follow-up.        Great to see you today.  I have refilled the medication(s) we provide.   If labs were collected or images ordered, we will inform you of  results once we have received them and reviewed. We will contact you either by echart message, or telephone call.  Please give ample time to the testing facility, and our office to run,  receive and review results. Please do not call inquiring of results, even if you can see them in your chart. We will contact you as soon as we are able. If it has been over 1 week since the test was completed, and you have not yet heard from us , then please call us .    - echart message- for normal results that have been seen by the patient already.   - telephone call: abnormal results or if patient has not viewed results in their echart.  If a referral to a specialist was entered for you, please call us  in 2 weeks if you have not heard from the specialist office to schedule.

## 2024-04-23 ENCOUNTER — Ambulatory Visit: Payer: Self-pay | Admitting: Family Medicine

## 2024-04-23 MED ORDER — EZETIMIBE 10 MG PO TABS: 10.0000 mg | ORAL_TABLET | Freq: Every day | ORAL | 3 refills | Status: AC

## 2024-04-23 NOTE — Telephone Encounter (Signed)
 Please call patient Liver, thyroid , blood cell counts, vitamin D , electrolytes and kidney levels are normal.  Make sure you are hydrating well. Diabetes screening/A1c went up to 7.6.  Make sure to follow a low glycemic/diabetic diet and follow-up with your endocrinologist. Urine protein levels are normal  Iron  levels are about the same at the low end of normal, with low iron  saturations-if she is not taking an iron  supplement currently, she could benefit from adding OTC ferrous sulfate 325 mg 3 days a week.  Cholesterol panel is above goal with an LDL of 129.  Your goal LDL would be less than 70 since you are diabetic and treated for hypertension. - I know she had concerns of her dementia development with the use of Lipitor.  Therefore I called in a medication called Zetia , which is not in the same class as Lipitor, but can help with decreasing the LDL.  It does not however provide the extra cardiovascular benefits of Lipitor.

## 2024-05-01 ENCOUNTER — Other Ambulatory Visit: Payer: Self-pay

## 2024-05-01 MED ORDER — FREESTYLE LIBRE 3 PLUS SENSOR MISC: 1.0000 | 3 refills | Status: AC

## 2024-05-13 ENCOUNTER — Encounter: Payer: Self-pay | Admitting: Internal Medicine

## 2024-06-16 ENCOUNTER — Ambulatory Visit: Admitting: Internal Medicine

## 2024-10-07 ENCOUNTER — Ambulatory Visit: Admitting: Family Medicine
